# Patient Record
Sex: Male | Born: 1940
Health system: Southern US, Community
[De-identification: ages and names within clinical notes are randomized; demographics above are authoritative.]

## PROBLEM LIST (undated history)

## (undated) DIAGNOSIS — I10 Essential (primary) hypertension: Secondary | ICD-10-CM

## (undated) DIAGNOSIS — I519 Heart disease, unspecified: Secondary | ICD-10-CM

## (undated) DIAGNOSIS — K219 Gastro-esophageal reflux disease without esophagitis: Secondary | ICD-10-CM

## (undated) DIAGNOSIS — Z9289 Personal history of other medical treatment: Secondary | ICD-10-CM

## (undated) DIAGNOSIS — D126 Benign neoplasm of colon, unspecified: Secondary | ICD-10-CM

## (undated) DIAGNOSIS — T4145XA Adverse effect of unspecified anesthetic, initial encounter: Secondary | ICD-10-CM

## (undated) DIAGNOSIS — M8888 Osteitis deformans of other bones: Secondary | ICD-10-CM

## (undated) DIAGNOSIS — E1142 Type 2 diabetes mellitus with diabetic polyneuropathy: Secondary | ICD-10-CM

## (undated) DIAGNOSIS — T8859XA Other complications of anesthesia, initial encounter: Secondary | ICD-10-CM

## (undated) DIAGNOSIS — R112 Nausea with vomiting, unspecified: Secondary | ICD-10-CM

## (undated) DIAGNOSIS — C61 Malignant neoplasm of prostate: Secondary | ICD-10-CM

## (undated) DIAGNOSIS — I5189 Other ill-defined heart diseases: Secondary | ICD-10-CM

## (undated) DIAGNOSIS — Z96649 Presence of unspecified artificial hip joint: Secondary | ICD-10-CM

## (undated) DIAGNOSIS — Z9889 Other specified postprocedural states: Secondary | ICD-10-CM

## (undated) DIAGNOSIS — J302 Other seasonal allergic rhinitis: Secondary | ICD-10-CM

## (undated) DIAGNOSIS — K56609 Unspecified intestinal obstruction, unspecified as to partial versus complete obstruction: Secondary | ICD-10-CM

## (undated) DIAGNOSIS — K449 Diaphragmatic hernia without obstruction or gangrene: Secondary | ICD-10-CM

## (undated) DIAGNOSIS — I872 Venous insufficiency (chronic) (peripheral): Secondary | ICD-10-CM

## (undated) DIAGNOSIS — R011 Cardiac murmur, unspecified: Secondary | ICD-10-CM

## (undated) DIAGNOSIS — M86669 Other chronic osteomyelitis, unspecified tibia and fibula: Secondary | ICD-10-CM

## (undated) DIAGNOSIS — M1990 Unspecified osteoarthritis, unspecified site: Secondary | ICD-10-CM

## (undated) HISTORY — PX: WISDOM TOOTH EXTRACTION: SHX21

## (undated) HISTORY — PX: COLONOSCOPY: SHX174

## (undated) HISTORY — DX: Unspecified osteoarthritis, unspecified site: M19.90

## (undated) HISTORY — PX: LEG SURGERY: SHX1003

## (undated) HISTORY — DX: Benign neoplasm of colon, unspecified: D12.6

## (undated) HISTORY — DX: Diaphragmatic hernia without obstruction or gangrene: K44.9

## (undated) HISTORY — PX: SKIN GRAFT: SHX250

## (undated) HISTORY — PX: LEG WOUND REPAIR / CLOSURE: SUR1143

## (undated) HISTORY — PX: HIP ARTHROPLASTY: SHX981

## (undated) HISTORY — PX: WRIST FUSION: SHX839

## (undated) HISTORY — DX: Presence of unspecified artificial hip joint: Z96.649

## (undated) HISTORY — PX: UPPER GASTROINTESTINAL ENDOSCOPY: SHX188

## (undated) HISTORY — DX: Venous insufficiency (chronic) (peripheral): I87.2

---

## 1974-12-30 HISTORY — PX: SKIN GRAFT: SHX250

## 1998-05-16 ENCOUNTER — Ambulatory Visit (HOSPITAL_COMMUNITY): Admission: RE | Admit: 1998-05-16 | Discharge: 1998-05-16 | Payer: Self-pay | Admitting: Internal Medicine

## 1998-05-24 ENCOUNTER — Ambulatory Visit (HOSPITAL_COMMUNITY): Admission: RE | Admit: 1998-05-24 | Discharge: 1998-05-24 | Payer: Self-pay | Admitting: Internal Medicine

## 1998-06-26 ENCOUNTER — Ambulatory Visit (HOSPITAL_COMMUNITY): Admission: RE | Admit: 1998-06-26 | Discharge: 1998-06-26 | Payer: Self-pay | Admitting: Internal Medicine

## 1998-07-07 ENCOUNTER — Ambulatory Visit (HOSPITAL_COMMUNITY): Admission: RE | Admit: 1998-07-07 | Discharge: 1998-07-07 | Payer: Self-pay | Admitting: Internal Medicine

## 1998-07-12 ENCOUNTER — Ambulatory Visit (HOSPITAL_COMMUNITY): Admission: RE | Admit: 1998-07-12 | Discharge: 1998-07-12 | Payer: Self-pay | Admitting: Internal Medicine

## 1998-07-20 ENCOUNTER — Encounter: Admission: RE | Admit: 1998-07-20 | Discharge: 1998-10-18 | Payer: Self-pay | Admitting: Internal Medicine

## 1999-03-31 ENCOUNTER — Inpatient Hospital Stay (HOSPITAL_COMMUNITY): Admission: EM | Admit: 1999-03-31 | Discharge: 1999-04-05 | Payer: Self-pay | Admitting: Emergency Medicine

## 2000-01-07 ENCOUNTER — Encounter: Payer: Self-pay | Admitting: Emergency Medicine

## 2000-01-07 ENCOUNTER — Emergency Department (HOSPITAL_COMMUNITY): Admission: EM | Admit: 2000-01-07 | Discharge: 2000-01-07 | Payer: Self-pay | Admitting: Emergency Medicine

## 2000-01-29 ENCOUNTER — Encounter: Payer: Self-pay | Admitting: Sports Medicine

## 2000-01-29 ENCOUNTER — Encounter: Admission: RE | Admit: 2000-01-29 | Discharge: 2000-01-29 | Payer: Self-pay | Admitting: Sports Medicine

## 2000-01-29 ENCOUNTER — Encounter: Admission: RE | Admit: 2000-01-29 | Discharge: 2000-01-29 | Payer: Self-pay | Admitting: Family Medicine

## 2000-01-31 ENCOUNTER — Encounter (INDEPENDENT_AMBULATORY_CARE_PROVIDER_SITE_OTHER): Payer: Self-pay | Admitting: *Deleted

## 2000-03-03 ENCOUNTER — Encounter: Admission: RE | Admit: 2000-03-03 | Discharge: 2000-03-03 | Payer: Self-pay | Admitting: Family Medicine

## 2000-04-01 ENCOUNTER — Encounter: Admission: RE | Admit: 2000-04-01 | Discharge: 2000-04-01 | Payer: Self-pay | Admitting: Family Medicine

## 2000-04-14 ENCOUNTER — Encounter: Admission: RE | Admit: 2000-04-14 | Discharge: 2000-04-14 | Payer: Self-pay | Admitting: Family Medicine

## 2000-05-19 ENCOUNTER — Encounter: Admission: RE | Admit: 2000-05-19 | Discharge: 2000-08-17 | Payer: Self-pay | Admitting: Surgery

## 2000-05-20 ENCOUNTER — Encounter: Admission: RE | Admit: 2000-05-20 | Discharge: 2000-05-20 | Payer: Self-pay | Admitting: Family Medicine

## 2000-06-02 ENCOUNTER — Encounter (INDEPENDENT_AMBULATORY_CARE_PROVIDER_SITE_OTHER): Payer: Self-pay | Admitting: Specialist

## 2000-06-02 ENCOUNTER — Ambulatory Visit (HOSPITAL_COMMUNITY): Admission: RE | Admit: 2000-06-02 | Discharge: 2000-06-02 | Payer: Self-pay | Admitting: Gastroenterology

## 2000-06-09 ENCOUNTER — Encounter: Admission: RE | Admit: 2000-06-09 | Discharge: 2000-06-09 | Payer: Self-pay | Admitting: Family Medicine

## 2000-06-23 ENCOUNTER — Encounter: Admission: RE | Admit: 2000-06-23 | Discharge: 2000-06-23 | Payer: Self-pay | Admitting: Family Medicine

## 2000-06-27 ENCOUNTER — Encounter: Admission: RE | Admit: 2000-06-27 | Discharge: 2000-06-27 | Payer: Self-pay | Admitting: Family Medicine

## 2000-07-11 ENCOUNTER — Inpatient Hospital Stay (HOSPITAL_COMMUNITY): Admission: EM | Admit: 2000-07-11 | Discharge: 2000-07-12 | Payer: Self-pay | Admitting: Emergency Medicine

## 2000-07-17 ENCOUNTER — Encounter: Admission: RE | Admit: 2000-07-17 | Discharge: 2000-07-17 | Payer: Self-pay | Admitting: Family Medicine

## 2000-07-24 ENCOUNTER — Encounter: Admission: RE | Admit: 2000-07-24 | Discharge: 2000-07-24 | Payer: Self-pay | Admitting: Family Medicine

## 2000-07-31 ENCOUNTER — Encounter: Admission: RE | Admit: 2000-07-31 | Discharge: 2000-07-31 | Payer: Self-pay | Admitting: Family Medicine

## 2000-08-05 ENCOUNTER — Encounter: Payer: Self-pay | Admitting: Orthopedic Surgery

## 2000-08-05 ENCOUNTER — Encounter: Admission: RE | Admit: 2000-08-05 | Discharge: 2000-11-03 | Payer: Self-pay | Admitting: Orthopedic Surgery

## 2000-08-08 ENCOUNTER — Encounter: Payer: Self-pay | Admitting: Orthopedic Surgery

## 2000-08-08 ENCOUNTER — Ambulatory Visit (HOSPITAL_COMMUNITY): Admission: RE | Admit: 2000-08-08 | Discharge: 2000-08-08 | Payer: Self-pay | Admitting: Orthopedic Surgery

## 2000-09-03 ENCOUNTER — Encounter: Payer: Self-pay | Admitting: Orthopedic Surgery

## 2000-09-04 ENCOUNTER — Inpatient Hospital Stay (HOSPITAL_COMMUNITY): Admission: AD | Admit: 2000-09-04 | Discharge: 2000-09-09 | Payer: Self-pay | Admitting: Orthopedic Surgery

## 2000-09-09 ENCOUNTER — Inpatient Hospital Stay (HOSPITAL_COMMUNITY)
Admission: RE | Admit: 2000-09-09 | Discharge: 2000-09-16 | Payer: Self-pay | Admitting: Physical Medicine & Rehabilitation

## 2000-09-17 ENCOUNTER — Encounter: Admission: RE | Admit: 2000-09-17 | Discharge: 2000-09-17 | Payer: Self-pay | Admitting: Family Medicine

## 2000-10-01 ENCOUNTER — Inpatient Hospital Stay (HOSPITAL_COMMUNITY): Admission: RE | Admit: 2000-10-01 | Discharge: 2000-10-06 | Payer: Self-pay | Admitting: Orthopedic Surgery

## 2000-11-26 ENCOUNTER — Encounter: Admission: RE | Admit: 2000-11-26 | Discharge: 2000-11-26 | Payer: Self-pay | Admitting: Family Medicine

## 2000-12-03 ENCOUNTER — Encounter: Admission: RE | Admit: 2000-12-03 | Discharge: 2001-03-03 | Payer: Self-pay | Admitting: Internal Medicine

## 2001-02-06 ENCOUNTER — Encounter: Admission: RE | Admit: 2001-02-06 | Discharge: 2001-02-06 | Payer: Self-pay | Admitting: Family Medicine

## 2001-03-10 ENCOUNTER — Encounter: Admission: RE | Admit: 2001-03-10 | Discharge: 2001-06-08 | Payer: Self-pay | Admitting: Orthopedic Surgery

## 2001-04-06 ENCOUNTER — Encounter: Admission: RE | Admit: 2001-04-06 | Discharge: 2001-04-06 | Payer: Self-pay | Admitting: Family Medicine

## 2001-05-08 ENCOUNTER — Encounter: Admission: RE | Admit: 2001-05-08 | Discharge: 2001-05-08 | Payer: Self-pay | Admitting: Family Medicine

## 2001-06-05 ENCOUNTER — Encounter: Admission: RE | Admit: 2001-06-05 | Discharge: 2001-06-05 | Payer: Self-pay | Admitting: Family Medicine

## 2001-06-09 ENCOUNTER — Encounter: Admission: RE | Admit: 2001-06-09 | Discharge: 2001-08-17 | Payer: Self-pay | Admitting: Internal Medicine

## 2001-06-29 ENCOUNTER — Encounter: Admission: RE | Admit: 2001-06-29 | Discharge: 2001-06-29 | Payer: Self-pay | Admitting: Family Medicine

## 2001-07-13 ENCOUNTER — Encounter: Admission: RE | Admit: 2001-07-13 | Discharge: 2001-07-13 | Payer: Self-pay | Admitting: Family Medicine

## 2001-07-20 ENCOUNTER — Encounter: Admission: RE | Admit: 2001-07-20 | Discharge: 2001-07-20 | Payer: Self-pay | Admitting: Family Medicine

## 2001-07-31 ENCOUNTER — Encounter: Admission: RE | Admit: 2001-07-31 | Discharge: 2001-07-31 | Payer: Self-pay | Admitting: Family Medicine

## 2001-07-31 ENCOUNTER — Encounter: Payer: Self-pay | Admitting: *Deleted

## 2001-07-31 ENCOUNTER — Encounter: Admission: RE | Admit: 2001-07-31 | Discharge: 2001-07-31 | Payer: Self-pay | Admitting: *Deleted

## 2001-08-19 ENCOUNTER — Encounter: Admission: RE | Admit: 2001-08-19 | Discharge: 2001-08-19 | Payer: Self-pay | Admitting: Family Medicine

## 2001-08-31 ENCOUNTER — Encounter: Admission: RE | Admit: 2001-08-31 | Discharge: 2001-10-05 | Payer: Self-pay | Admitting: Internal Medicine

## 2001-10-05 ENCOUNTER — Encounter: Admission: RE | Admit: 2001-10-05 | Discharge: 2001-10-05 | Payer: Self-pay | Admitting: Family Medicine

## 2001-10-07 ENCOUNTER — Encounter: Admission: RE | Admit: 2001-10-07 | Discharge: 2001-10-07 | Payer: Self-pay | Admitting: *Deleted

## 2001-10-07 ENCOUNTER — Encounter: Payer: Self-pay | Admitting: *Deleted

## 2001-10-14 ENCOUNTER — Encounter: Admission: RE | Admit: 2001-10-14 | Discharge: 2001-10-14 | Payer: Self-pay | Admitting: Family Medicine

## 2001-10-18 ENCOUNTER — Emergency Department (HOSPITAL_COMMUNITY): Admission: EM | Admit: 2001-10-18 | Discharge: 2001-10-18 | Payer: Self-pay | Admitting: Emergency Medicine

## 2001-10-18 ENCOUNTER — Encounter: Payer: Self-pay | Admitting: Emergency Medicine

## 2001-10-20 ENCOUNTER — Ambulatory Visit (HOSPITAL_COMMUNITY): Admission: RE | Admit: 2001-10-20 | Discharge: 2001-10-20 | Payer: Self-pay | Admitting: Emergency Medicine

## 2001-10-20 ENCOUNTER — Encounter: Payer: Self-pay | Admitting: Emergency Medicine

## 2001-11-03 ENCOUNTER — Encounter: Admission: RE | Admit: 2001-11-03 | Discharge: 2001-11-03 | Payer: Self-pay | Admitting: Sports Medicine

## 2001-12-07 ENCOUNTER — Ambulatory Visit (HOSPITAL_COMMUNITY): Admission: RE | Admit: 2001-12-07 | Discharge: 2001-12-07 | Payer: Self-pay | Admitting: *Deleted

## 2001-12-07 ENCOUNTER — Encounter: Payer: Self-pay | Admitting: *Deleted

## 2001-12-08 ENCOUNTER — Encounter: Admission: RE | Admit: 2001-12-08 | Discharge: 2001-12-08 | Payer: Self-pay | Admitting: Family Medicine

## 2001-12-09 ENCOUNTER — Encounter: Admission: RE | Admit: 2001-12-09 | Discharge: 2001-12-09 | Payer: Self-pay | Admitting: Family Medicine

## 2001-12-18 ENCOUNTER — Encounter: Admission: RE | Admit: 2001-12-18 | Discharge: 2001-12-18 | Payer: Self-pay | Admitting: Family Medicine

## 2002-01-12 ENCOUNTER — Encounter: Admission: RE | Admit: 2002-01-12 | Discharge: 2002-04-02 | Payer: Self-pay | Admitting: Orthopedic Surgery

## 2002-02-05 ENCOUNTER — Encounter: Admission: RE | Admit: 2002-02-05 | Discharge: 2002-02-05 | Payer: Self-pay | Admitting: Family Medicine

## 2002-03-09 ENCOUNTER — Encounter: Admission: RE | Admit: 2002-03-09 | Discharge: 2002-03-09 | Payer: Self-pay | Admitting: Family Medicine

## 2002-03-19 ENCOUNTER — Encounter: Admission: RE | Admit: 2002-03-19 | Discharge: 2002-03-19 | Payer: Self-pay | Admitting: Family Medicine

## 2002-04-13 ENCOUNTER — Encounter (HOSPITAL_BASED_OUTPATIENT_CLINIC_OR_DEPARTMENT_OTHER): Admission: RE | Admit: 2002-04-13 | Discharge: 2002-04-19 | Payer: Self-pay | Admitting: Orthopedic Surgery

## 2002-04-19 ENCOUNTER — Encounter: Payer: Self-pay | Admitting: Orthopedic Surgery

## 2002-04-23 ENCOUNTER — Inpatient Hospital Stay (HOSPITAL_COMMUNITY): Admission: RE | Admit: 2002-04-23 | Discharge: 2002-04-28 | Payer: Self-pay | Admitting: Orthopedic Surgery

## 2002-04-23 ENCOUNTER — Encounter: Payer: Self-pay | Admitting: Orthopedic Surgery

## 2002-06-03 ENCOUNTER — Encounter: Admission: RE | Admit: 2002-06-03 | Discharge: 2002-06-03 | Payer: Self-pay | Admitting: Family Medicine

## 2002-06-17 ENCOUNTER — Encounter: Admission: RE | Admit: 2002-06-17 | Discharge: 2002-06-17 | Payer: Self-pay | Admitting: Family Medicine

## 2002-07-01 ENCOUNTER — Encounter: Admission: RE | Admit: 2002-07-01 | Discharge: 2002-07-01 | Payer: Self-pay | Admitting: Family Medicine

## 2002-07-15 ENCOUNTER — Encounter: Admission: RE | Admit: 2002-07-15 | Discharge: 2002-07-15 | Payer: Self-pay | Admitting: Family Medicine

## 2002-08-23 ENCOUNTER — Encounter: Admission: RE | Admit: 2002-08-23 | Discharge: 2002-08-23 | Payer: Self-pay | Admitting: Family Medicine

## 2002-09-06 ENCOUNTER — Encounter: Admission: RE | Admit: 2002-09-06 | Discharge: 2002-09-06 | Payer: Self-pay | Admitting: Family Medicine

## 2002-09-22 ENCOUNTER — Encounter: Admission: RE | Admit: 2002-09-22 | Discharge: 2002-09-22 | Payer: Self-pay | Admitting: Sports Medicine

## 2002-10-06 ENCOUNTER — Encounter: Admission: RE | Admit: 2002-10-06 | Discharge: 2002-10-06 | Payer: Self-pay | Admitting: Family Medicine

## 2002-10-21 ENCOUNTER — Encounter: Admission: RE | Admit: 2002-10-21 | Discharge: 2002-10-21 | Payer: Self-pay | Admitting: Family Medicine

## 2002-11-03 ENCOUNTER — Encounter: Admission: RE | Admit: 2002-11-03 | Discharge: 2002-11-03 | Payer: Self-pay | Admitting: Family Medicine

## 2003-01-19 ENCOUNTER — Encounter: Admission: RE | Admit: 2003-01-19 | Discharge: 2003-01-19 | Payer: Self-pay | Admitting: Family Medicine

## 2003-02-09 ENCOUNTER — Encounter: Payer: Self-pay | Admitting: Orthopedic Surgery

## 2003-02-09 ENCOUNTER — Inpatient Hospital Stay (HOSPITAL_COMMUNITY): Admission: RE | Admit: 2003-02-09 | Discharge: 2003-02-15 | Payer: Self-pay | Admitting: Orthopedic Surgery

## 2003-02-15 ENCOUNTER — Inpatient Hospital Stay
Admission: RE | Admit: 2003-02-15 | Discharge: 2003-03-01 | Payer: Self-pay | Admitting: Physical Medicine & Rehabilitation

## 2003-04-14 ENCOUNTER — Encounter: Admission: RE | Admit: 2003-04-14 | Discharge: 2003-04-14 | Payer: Self-pay | Admitting: Family Medicine

## 2003-08-22 ENCOUNTER — Encounter: Admission: RE | Admit: 2003-08-22 | Discharge: 2003-08-22 | Payer: Self-pay | Admitting: Sports Medicine

## 2003-10-19 ENCOUNTER — Encounter: Admission: RE | Admit: 2003-10-19 | Discharge: 2003-10-19 | Payer: Self-pay | Admitting: Family Medicine

## 2003-10-26 ENCOUNTER — Encounter: Admission: RE | Admit: 2003-10-26 | Discharge: 2003-10-26 | Payer: Self-pay | Admitting: Family Medicine

## 2003-11-09 ENCOUNTER — Encounter: Admission: RE | Admit: 2003-11-09 | Discharge: 2003-11-09 | Payer: Self-pay | Admitting: Family Medicine

## 2003-11-21 ENCOUNTER — Encounter: Admission: RE | Admit: 2003-11-21 | Discharge: 2003-11-21 | Payer: Self-pay | Admitting: Family Medicine

## 2003-11-23 ENCOUNTER — Inpatient Hospital Stay (HOSPITAL_COMMUNITY): Admission: RE | Admit: 2003-11-23 | Discharge: 2003-11-28 | Payer: Self-pay | Admitting: Orthopedic Surgery

## 2003-11-28 ENCOUNTER — Inpatient Hospital Stay (HOSPITAL_COMMUNITY)
Admission: RE | Admit: 2003-11-28 | Discharge: 2003-12-05 | Payer: Self-pay | Admitting: Physical Medicine & Rehabilitation

## 2003-12-26 ENCOUNTER — Encounter: Admission: RE | Admit: 2003-12-26 | Discharge: 2003-12-26 | Payer: Self-pay | Admitting: Family Medicine

## 2004-02-16 ENCOUNTER — Encounter: Admission: RE | Admit: 2004-02-16 | Discharge: 2004-02-16 | Payer: Self-pay | Admitting: Sports Medicine

## 2004-03-08 ENCOUNTER — Encounter: Admission: RE | Admit: 2004-03-08 | Discharge: 2004-03-08 | Payer: Self-pay | Admitting: Family Medicine

## 2004-05-16 ENCOUNTER — Encounter: Admission: RE | Admit: 2004-05-16 | Discharge: 2004-05-16 | Payer: Self-pay | Admitting: Family Medicine

## 2004-06-15 ENCOUNTER — Encounter: Admission: RE | Admit: 2004-06-15 | Discharge: 2004-06-15 | Payer: Self-pay | Admitting: Family Medicine

## 2004-08-05 ENCOUNTER — Emergency Department (HOSPITAL_COMMUNITY): Admission: EM | Admit: 2004-08-05 | Discharge: 2004-08-05 | Payer: Self-pay

## 2004-08-06 ENCOUNTER — Encounter: Admission: RE | Admit: 2004-08-06 | Discharge: 2004-08-06 | Payer: Self-pay | Admitting: Family Medicine

## 2004-08-13 ENCOUNTER — Encounter: Admission: RE | Admit: 2004-08-13 | Discharge: 2004-08-13 | Payer: Self-pay | Admitting: Family Medicine

## 2004-09-17 ENCOUNTER — Ambulatory Visit: Payer: Self-pay | Admitting: Family Medicine

## 2004-09-21 ENCOUNTER — Ambulatory Visit: Payer: Self-pay | Admitting: Family Medicine

## 2004-10-25 ENCOUNTER — Ambulatory Visit: Payer: Self-pay | Admitting: Family Medicine

## 2004-11-06 ENCOUNTER — Ambulatory Visit: Payer: Self-pay | Admitting: Family Medicine

## 2004-12-13 ENCOUNTER — Ambulatory Visit: Payer: Self-pay | Admitting: Family Medicine

## 2005-01-29 ENCOUNTER — Ambulatory Visit: Payer: Self-pay | Admitting: Family Medicine

## 2005-01-31 ENCOUNTER — Encounter: Admission: RE | Admit: 2005-01-31 | Discharge: 2005-01-31 | Payer: Self-pay | Admitting: Sports Medicine

## 2005-02-12 ENCOUNTER — Ambulatory Visit: Payer: Self-pay | Admitting: Sports Medicine

## 2005-03-05 ENCOUNTER — Encounter (INDEPENDENT_AMBULATORY_CARE_PROVIDER_SITE_OTHER): Payer: Self-pay | Admitting: Specialist

## 2005-03-05 ENCOUNTER — Ambulatory Visit (HOSPITAL_COMMUNITY): Admission: RE | Admit: 2005-03-05 | Discharge: 2005-03-05 | Payer: Self-pay | Admitting: Gastroenterology

## 2005-03-08 ENCOUNTER — Ambulatory Visit: Payer: Self-pay | Admitting: Family Medicine

## 2005-03-14 ENCOUNTER — Ambulatory Visit: Payer: Self-pay | Admitting: Family Medicine

## 2005-04-08 ENCOUNTER — Ambulatory Visit: Payer: Self-pay | Admitting: Family Medicine

## 2005-06-28 ENCOUNTER — Ambulatory Visit: Payer: Self-pay | Admitting: Family Medicine

## 2005-07-09 ENCOUNTER — Ambulatory Visit: Payer: Self-pay | Admitting: Family Medicine

## 2005-10-21 ENCOUNTER — Ambulatory Visit: Payer: Self-pay | Admitting: Family Medicine

## 2005-11-27 ENCOUNTER — Ambulatory Visit: Payer: Self-pay | Admitting: Family Medicine

## 2006-01-27 ENCOUNTER — Ambulatory Visit: Payer: Self-pay | Admitting: Sports Medicine

## 2006-02-18 ENCOUNTER — Ambulatory Visit: Admission: RE | Admit: 2006-02-18 | Discharge: 2006-02-18 | Payer: Self-pay | Admitting: Orthopedic Surgery

## 2006-02-19 ENCOUNTER — Ambulatory Visit: Payer: Self-pay | Admitting: Cardiology

## 2006-02-24 ENCOUNTER — Encounter (HOSPITAL_COMMUNITY): Admission: RE | Admit: 2006-02-24 | Discharge: 2006-05-25 | Payer: Self-pay | Admitting: Cardiology

## 2006-02-25 ENCOUNTER — Ambulatory Visit: Payer: Self-pay | Admitting: Cardiovascular Disease

## 2006-02-27 ENCOUNTER — Ambulatory Visit: Payer: Self-pay

## 2006-02-27 ENCOUNTER — Encounter: Payer: Self-pay | Admitting: Internal Medicine

## 2006-03-19 ENCOUNTER — Ambulatory Visit: Payer: Self-pay | Admitting: Physical Medicine & Rehabilitation

## 2006-03-19 ENCOUNTER — Ambulatory Visit: Payer: Self-pay | Admitting: Family Medicine

## 2006-03-19 ENCOUNTER — Inpatient Hospital Stay (HOSPITAL_COMMUNITY): Admission: RE | Admit: 2006-03-19 | Discharge: 2006-03-26 | Payer: Self-pay | Admitting: Orthopedic Surgery

## 2006-04-03 ENCOUNTER — Ambulatory Visit: Payer: Self-pay | Admitting: Family Medicine

## 2006-04-03 ENCOUNTER — Encounter: Payer: Self-pay | Admitting: Vascular Surgery

## 2006-04-03 ENCOUNTER — Ambulatory Visit (HOSPITAL_COMMUNITY): Admission: RE | Admit: 2006-04-03 | Discharge: 2006-04-03 | Payer: Self-pay | Admitting: Family Medicine

## 2006-04-11 ENCOUNTER — Ambulatory Visit: Payer: Self-pay | Admitting: Family Medicine

## 2006-04-18 ENCOUNTER — Ambulatory Visit: Payer: Self-pay | Admitting: Family Medicine

## 2006-04-25 ENCOUNTER — Ambulatory Visit: Payer: Self-pay | Admitting: Family Medicine

## 2006-09-29 DIAGNOSIS — K449 Diaphragmatic hernia without obstruction or gangrene: Secondary | ICD-10-CM

## 2006-09-29 HISTORY — DX: Diaphragmatic hernia without obstruction or gangrene: K44.9

## 2006-10-20 ENCOUNTER — Ambulatory Visit: Payer: Self-pay | Admitting: Family Medicine

## 2006-10-20 LAB — CONVERTED CEMR LAB: PSA: 3.01 ng/mL

## 2006-11-11 ENCOUNTER — Ambulatory Visit: Payer: Self-pay | Admitting: Family Medicine

## 2006-12-31 ENCOUNTER — Ambulatory Visit: Payer: Self-pay | Admitting: Sports Medicine

## 2007-02-04 ENCOUNTER — Ambulatory Visit: Payer: Self-pay | Admitting: Family Medicine

## 2007-02-26 DIAGNOSIS — N529 Male erectile dysfunction, unspecified: Secondary | ICD-10-CM

## 2007-02-26 DIAGNOSIS — I872 Venous insufficiency (chronic) (peripheral): Secondary | ICD-10-CM

## 2007-02-26 DIAGNOSIS — D126 Benign neoplasm of colon, unspecified: Secondary | ICD-10-CM

## 2007-02-26 HISTORY — DX: Venous insufficiency (chronic) (peripheral): I87.2

## 2007-02-26 HISTORY — DX: Benign neoplasm of colon, unspecified: D12.6

## 2007-02-27 ENCOUNTER — Encounter (INDEPENDENT_AMBULATORY_CARE_PROVIDER_SITE_OTHER): Payer: Self-pay | Admitting: *Deleted

## 2007-03-05 ENCOUNTER — Ambulatory Visit: Payer: Self-pay | Admitting: Family Medicine

## 2007-03-05 DIAGNOSIS — Z6841 Body Mass Index (BMI) 40.0 and over, adult: Secondary | ICD-10-CM

## 2007-03-05 DIAGNOSIS — I10 Essential (primary) hypertension: Secondary | ICD-10-CM

## 2007-03-05 DIAGNOSIS — M1990 Unspecified osteoarthritis, unspecified site: Secondary | ICD-10-CM

## 2007-03-05 HISTORY — DX: Unspecified osteoarthritis, unspecified site: M19.90

## 2007-04-01 ENCOUNTER — Telehealth: Payer: Self-pay | Admitting: *Deleted

## 2007-04-27 ENCOUNTER — Telehealth (INDEPENDENT_AMBULATORY_CARE_PROVIDER_SITE_OTHER): Payer: Self-pay | Admitting: Family Medicine

## 2007-05-04 ENCOUNTER — Encounter (INDEPENDENT_AMBULATORY_CARE_PROVIDER_SITE_OTHER): Payer: Self-pay | Admitting: Family Medicine

## 2007-05-07 ENCOUNTER — Telehealth (INDEPENDENT_AMBULATORY_CARE_PROVIDER_SITE_OTHER): Payer: Self-pay | Admitting: *Deleted

## 2007-05-12 ENCOUNTER — Ambulatory Visit: Payer: Self-pay | Admitting: Family Medicine

## 2007-05-12 DIAGNOSIS — Z96649 Presence of unspecified artificial hip joint: Secondary | ICD-10-CM

## 2007-05-12 HISTORY — DX: Presence of unspecified artificial hip joint: Z96.649

## 2007-06-08 ENCOUNTER — Ambulatory Visit: Payer: Self-pay | Admitting: Family Medicine

## 2007-06-08 ENCOUNTER — Encounter: Payer: Self-pay | Admitting: Family Medicine

## 2007-06-08 ENCOUNTER — Telehealth: Payer: Self-pay | Admitting: *Deleted

## 2007-06-08 LAB — CONVERTED CEMR LAB
Bilirubin Urine: NEGATIVE
Blood in Urine, dipstick: NEGATIVE
Glucose, Urine, Semiquant: NEGATIVE
HCT: 35.2 %
Hemoglobin: 11.7 g/dL
MCV: 80.1 fL
Nitrite: NEGATIVE
Platelets: 264 10*3/uL
Protein, U semiquant: 30
Urobilinogen, UA: 0.2
WBC Urine, dipstick: NEGATIVE

## 2007-06-09 LAB — CONVERTED CEMR LAB
ALT: 14 units/L (ref 0–53)
BUN: 18 mg/dL (ref 6–23)
Glucose, Bld: 152 mg/dL — ABNORMAL HIGH (ref 70–99)
Potassium: 4.3 meq/L (ref 3.5–5.3)
Total Protein: 7.7 g/dL (ref 6.0–8.3)

## 2007-09-01 ENCOUNTER — Ambulatory Visit: Payer: Self-pay | Admitting: Family Medicine

## 2007-10-01 ENCOUNTER — Encounter (INDEPENDENT_AMBULATORY_CARE_PROVIDER_SITE_OTHER): Payer: Self-pay | Admitting: Family Medicine

## 2007-12-09 ENCOUNTER — Ambulatory Visit: Payer: Self-pay | Admitting: Family Medicine

## 2008-01-20 ENCOUNTER — Ambulatory Visit: Payer: Self-pay | Admitting: Family Medicine

## 2008-01-20 ENCOUNTER — Encounter (INDEPENDENT_AMBULATORY_CARE_PROVIDER_SITE_OTHER): Payer: Self-pay | Admitting: Family Medicine

## 2008-01-20 DIAGNOSIS — E1140 Type 2 diabetes mellitus with diabetic neuropathy, unspecified: Secondary | ICD-10-CM

## 2008-01-20 DIAGNOSIS — I358 Other nonrheumatic aortic valve disorders: Secondary | ICD-10-CM

## 2008-01-20 DIAGNOSIS — E1165 Type 2 diabetes mellitus with hyperglycemia: Secondary | ICD-10-CM

## 2008-01-20 LAB — CONVERTED CEMR LAB
BUN: 19 mg/dL (ref 6–23)
CO2: 22 meq/L (ref 19–32)
Calcium: 8.7 mg/dL (ref 8.4–10.5)
Creatinine, Ser: 1.01 mg/dL (ref 0.40–1.50)
Glucose, Bld: 227 mg/dL — ABNORMAL HIGH (ref 70–99)
Potassium: 4.2 meq/L (ref 3.5–5.3)

## 2008-02-07 ENCOUNTER — Encounter: Payer: Self-pay | Admitting: Family Medicine

## 2008-02-07 ENCOUNTER — Ambulatory Visit: Payer: Self-pay | Admitting: Internal Medicine

## 2008-02-07 ENCOUNTER — Inpatient Hospital Stay (HOSPITAL_COMMUNITY): Admission: EM | Admit: 2008-02-07 | Discharge: 2008-02-08 | Payer: Self-pay | Admitting: Emergency Medicine

## 2008-02-12 ENCOUNTER — Encounter (INDEPENDENT_AMBULATORY_CARE_PROVIDER_SITE_OTHER): Payer: Self-pay | Admitting: Family Medicine

## 2008-02-12 ENCOUNTER — Ambulatory Visit: Payer: Self-pay | Admitting: Family Medicine

## 2008-02-12 LAB — CONVERTED CEMR LAB
CO2: 26 meq/L (ref 19–32)
Chloride: 100 meq/L (ref 96–112)
Creatinine, Ser: 1.22 mg/dL (ref 0.40–1.50)
Potassium: 4.6 meq/L (ref 3.5–5.3)

## 2008-03-17 ENCOUNTER — Encounter (INDEPENDENT_AMBULATORY_CARE_PROVIDER_SITE_OTHER): Payer: Self-pay | Admitting: Family Medicine

## 2008-03-17 ENCOUNTER — Ambulatory Visit: Payer: Self-pay | Admitting: Family Medicine

## 2008-03-17 ENCOUNTER — Ambulatory Visit (HOSPITAL_COMMUNITY): Admission: RE | Admit: 2008-03-17 | Discharge: 2008-03-17 | Payer: Self-pay | Admitting: Cardiology

## 2008-03-17 LAB — CONVERTED CEMR LAB
CO2: 24 meq/L (ref 19–32)
Chloride: 107 meq/L (ref 96–112)
Creatinine, Ser: 1.16 mg/dL (ref 0.40–1.50)
Glucose, Bld: 103 mg/dL — ABNORMAL HIGH (ref 70–99)
Pro B Natriuretic peptide (BNP): 77 pg/mL (ref 0.0–100.0)

## 2008-03-31 ENCOUNTER — Encounter (INDEPENDENT_AMBULATORY_CARE_PROVIDER_SITE_OTHER): Payer: Self-pay | Admitting: Neurology

## 2008-03-31 ENCOUNTER — Ambulatory Visit (HOSPITAL_COMMUNITY): Admission: RE | Admit: 2008-03-31 | Discharge: 2008-03-31 | Payer: Self-pay | Admitting: Family Medicine

## 2008-04-07 ENCOUNTER — Encounter (INDEPENDENT_AMBULATORY_CARE_PROVIDER_SITE_OTHER): Payer: Self-pay | Admitting: Family Medicine

## 2008-04-07 ENCOUNTER — Ambulatory Visit: Payer: Self-pay | Admitting: Family Medicine

## 2008-04-07 LAB — CONVERTED CEMR LAB
ALT: 12 units/L (ref 0–53)
AST: 12 units/L (ref 0–37)
Albumin: 3.5 g/dL (ref 3.5–5.2)
Alkaline Phosphatase: 148 units/L — ABNORMAL HIGH (ref 39–117)
BUN: 22 mg/dL (ref 6–23)
CO2: 24 meq/L (ref 19–32)
Direct LDL: 92 mg/dL
PSA: 10.57 ng/mL — ABNORMAL HIGH (ref 0.10–4.00)
Potassium: 4.3 meq/L (ref 3.5–5.3)

## 2008-04-13 ENCOUNTER — Ambulatory Visit: Payer: Self-pay | Admitting: Family Medicine

## 2008-04-13 ENCOUNTER — Encounter: Payer: Self-pay | Admitting: *Deleted

## 2008-04-13 ENCOUNTER — Encounter (INDEPENDENT_AMBULATORY_CARE_PROVIDER_SITE_OTHER): Payer: Self-pay | Admitting: Family Medicine

## 2008-04-13 LAB — CONVERTED CEMR LAB: PSA: 9.62 ng/mL — ABNORMAL HIGH (ref 0.10–4.00)

## 2008-04-20 ENCOUNTER — Telehealth (INDEPENDENT_AMBULATORY_CARE_PROVIDER_SITE_OTHER): Payer: Self-pay | Admitting: *Deleted

## 2008-04-25 ENCOUNTER — Ambulatory Visit: Payer: Self-pay | Admitting: Family Medicine

## 2008-04-25 ENCOUNTER — Encounter (INDEPENDENT_AMBULATORY_CARE_PROVIDER_SITE_OTHER): Payer: Self-pay | Admitting: Family Medicine

## 2008-04-27 ENCOUNTER — Encounter (INDEPENDENT_AMBULATORY_CARE_PROVIDER_SITE_OTHER): Payer: Self-pay | Admitting: Family Medicine

## 2008-04-27 ENCOUNTER — Telehealth (INDEPENDENT_AMBULATORY_CARE_PROVIDER_SITE_OTHER): Payer: Self-pay | Admitting: *Deleted

## 2008-05-04 ENCOUNTER — Encounter (INDEPENDENT_AMBULATORY_CARE_PROVIDER_SITE_OTHER): Payer: Self-pay | Admitting: Family Medicine

## 2008-05-04 ENCOUNTER — Encounter: Payer: Self-pay | Admitting: Family Medicine

## 2008-05-12 ENCOUNTER — Ambulatory Visit (HOSPITAL_BASED_OUTPATIENT_CLINIC_OR_DEPARTMENT_OTHER): Admission: RE | Admit: 2008-05-12 | Discharge: 2008-05-12 | Payer: Self-pay | Admitting: Urology

## 2008-05-12 ENCOUNTER — Encounter: Payer: Self-pay | Admitting: Urology

## 2008-05-24 ENCOUNTER — Ambulatory Visit: Payer: Self-pay | Admitting: Family Medicine

## 2008-05-24 DIAGNOSIS — C61 Malignant neoplasm of prostate: Secondary | ICD-10-CM | POA: Insufficient documentation

## 2008-06-03 ENCOUNTER — Encounter: Admission: RE | Admit: 2008-06-03 | Discharge: 2008-06-03 | Payer: Self-pay | Admitting: Urology

## 2008-06-08 ENCOUNTER — Telehealth: Payer: Self-pay | Admitting: *Deleted

## 2008-06-08 ENCOUNTER — Telehealth (INDEPENDENT_AMBULATORY_CARE_PROVIDER_SITE_OTHER): Payer: Self-pay | Admitting: Family Medicine

## 2008-06-09 ENCOUNTER — Encounter (INDEPENDENT_AMBULATORY_CARE_PROVIDER_SITE_OTHER): Payer: Self-pay | Admitting: Family Medicine

## 2008-06-23 ENCOUNTER — Encounter (INDEPENDENT_AMBULATORY_CARE_PROVIDER_SITE_OTHER): Payer: Self-pay | Admitting: Family Medicine

## 2008-06-30 ENCOUNTER — Encounter: Payer: Self-pay | Admitting: Family Medicine

## 2008-07-12 ENCOUNTER — Ambulatory Visit: Admission: RE | Admit: 2008-07-12 | Discharge: 2008-08-24 | Payer: Self-pay | Admitting: Radiation Oncology

## 2008-07-29 ENCOUNTER — Ambulatory Visit: Payer: Self-pay | Admitting: Family Medicine

## 2008-07-29 LAB — CONVERTED CEMR LAB: Hgb A1c MFr Bld: 7.2 %

## 2008-11-21 ENCOUNTER — Encounter: Payer: Self-pay | Admitting: Family Medicine

## 2008-12-01 ENCOUNTER — Ambulatory Visit: Payer: Self-pay | Admitting: Family Medicine

## 2008-12-01 ENCOUNTER — Encounter: Payer: Self-pay | Admitting: Family Medicine

## 2008-12-01 DIAGNOSIS — E785 Hyperlipidemia, unspecified: Secondary | ICD-10-CM

## 2008-12-01 LAB — CONVERTED CEMR LAB: Hgb A1c MFr Bld: 8.1 %

## 2008-12-06 ENCOUNTER — Encounter: Payer: Self-pay | Admitting: Family Medicine

## 2009-01-24 ENCOUNTER — Telehealth (INDEPENDENT_AMBULATORY_CARE_PROVIDER_SITE_OTHER): Payer: Self-pay | Admitting: *Deleted

## 2009-01-30 ENCOUNTER — Encounter: Payer: Self-pay | Admitting: Family Medicine

## 2009-02-23 ENCOUNTER — Encounter: Payer: Self-pay | Admitting: Family Medicine

## 2009-03-20 ENCOUNTER — Ambulatory Visit: Payer: Self-pay | Admitting: Family Medicine

## 2009-03-20 LAB — CONVERTED CEMR LAB: Hgb A1c MFr Bld: 8.4 %

## 2009-03-30 ENCOUNTER — Ambulatory Visit: Payer: Self-pay | Admitting: Family Medicine

## 2009-05-05 ENCOUNTER — Telehealth: Payer: Self-pay | Admitting: *Deleted

## 2009-05-08 ENCOUNTER — Encounter: Payer: Self-pay | Admitting: Family Medicine

## 2009-05-08 ENCOUNTER — Ambulatory Visit: Payer: Self-pay | Admitting: Family Medicine

## 2009-05-11 ENCOUNTER — Encounter: Payer: Self-pay | Admitting: Family Medicine

## 2009-05-25 ENCOUNTER — Encounter: Payer: Self-pay | Admitting: Family Medicine

## 2009-08-07 ENCOUNTER — Ambulatory Visit: Payer: Self-pay | Admitting: Family Medicine

## 2009-08-07 ENCOUNTER — Encounter: Payer: Self-pay | Admitting: Family Medicine

## 2009-08-07 LAB — CONVERTED CEMR LAB
Cholesterol: 131 mg/dL (ref 0–200)
LDL Cholesterol: 72 mg/dL (ref 0–99)
Total CHOL/HDL Ratio: 3.1
VLDL: 17 mg/dL (ref 0–40)

## 2009-08-25 ENCOUNTER — Encounter: Payer: Self-pay | Admitting: Family Medicine

## 2009-09-28 ENCOUNTER — Encounter: Payer: Self-pay | Admitting: *Deleted

## 2009-10-06 ENCOUNTER — Ambulatory Visit: Payer: Self-pay | Admitting: Family Medicine

## 2009-10-10 ENCOUNTER — Encounter: Payer: Self-pay | Admitting: Family Medicine

## 2009-10-10 ENCOUNTER — Telehealth: Payer: Self-pay | Admitting: Family Medicine

## 2009-10-12 ENCOUNTER — Telehealth: Payer: Self-pay | Admitting: *Deleted

## 2009-10-16 ENCOUNTER — Encounter: Payer: Self-pay | Admitting: Family Medicine

## 2009-10-16 ENCOUNTER — Telehealth: Payer: Self-pay | Admitting: *Deleted

## 2009-10-20 ENCOUNTER — Telehealth (INDEPENDENT_AMBULATORY_CARE_PROVIDER_SITE_OTHER): Payer: Self-pay | Admitting: *Deleted

## 2009-11-08 ENCOUNTER — Encounter: Payer: Self-pay | Admitting: Family Medicine

## 2009-11-30 ENCOUNTER — Encounter: Payer: Self-pay | Admitting: Family Medicine

## 2010-01-29 ENCOUNTER — Ambulatory Visit: Payer: Self-pay | Admitting: Family Medicine

## 2010-01-29 LAB — CONVERTED CEMR LAB: Hgb A1c MFr Bld: 8.4 %

## 2010-03-22 ENCOUNTER — Encounter: Payer: Self-pay | Admitting: Family Medicine

## 2010-04-27 ENCOUNTER — Encounter: Payer: Self-pay | Admitting: Family Medicine

## 2010-05-03 ENCOUNTER — Ambulatory Visit: Payer: Self-pay | Admitting: Family Medicine

## 2010-05-03 ENCOUNTER — Encounter: Payer: Self-pay | Admitting: Family Medicine

## 2010-05-03 LAB — CONVERTED CEMR LAB
Calcium: 9.1 mg/dL (ref 8.4–10.5)
Creatinine, Ser: 1.12 mg/dL (ref 0.40–1.50)
Hgb A1c MFr Bld: 9.4 %
Sodium: 135 meq/L (ref 135–145)

## 2010-09-13 ENCOUNTER — Ambulatory Visit: Payer: Self-pay | Admitting: Family Medicine

## 2010-09-13 DIAGNOSIS — L97909 Non-pressure chronic ulcer of unspecified part of unspecified lower leg with unspecified severity: Secondary | ICD-10-CM

## 2010-09-13 DIAGNOSIS — I83009 Varicose veins of unspecified lower extremity with ulcer of unspecified site: Secondary | ICD-10-CM

## 2010-09-13 LAB — CONVERTED CEMR LAB: Hgb A1c MFr Bld: 8.9 %

## 2010-09-28 ENCOUNTER — Encounter (HOSPITAL_BASED_OUTPATIENT_CLINIC_OR_DEPARTMENT_OTHER): Admission: RE | Admit: 2010-09-28 | Discharge: 2010-11-06 | Payer: Self-pay | Admitting: Internal Medicine

## 2010-10-03 ENCOUNTER — Ambulatory Visit: Payer: Self-pay | Admitting: Vascular Surgery

## 2010-10-04 ENCOUNTER — Encounter: Payer: Self-pay | Admitting: Family Medicine

## 2010-10-22 ENCOUNTER — Encounter: Payer: Self-pay | Admitting: Family Medicine

## 2010-10-24 ENCOUNTER — Ambulatory Visit: Payer: Self-pay | Admitting: Family Medicine

## 2010-10-30 ENCOUNTER — Encounter: Payer: Self-pay | Admitting: Family Medicine

## 2010-11-13 ENCOUNTER — Encounter: Payer: Self-pay | Admitting: Family Medicine

## 2010-11-20 ENCOUNTER — Encounter: Payer: Self-pay | Admitting: Family Medicine

## 2010-11-20 DIAGNOSIS — M86669 Other chronic osteomyelitis, unspecified tibia and fibula: Secondary | ICD-10-CM

## 2010-11-20 HISTORY — DX: Other chronic osteomyelitis, unspecified tibia and fibula: M86.669

## 2010-12-04 ENCOUNTER — Encounter: Payer: Self-pay | Admitting: Family Medicine

## 2010-12-10 ENCOUNTER — Telehealth: Payer: Self-pay | Admitting: Family Medicine

## 2011-01-20 ENCOUNTER — Encounter: Payer: Self-pay | Admitting: Cardiology

## 2011-01-20 ENCOUNTER — Encounter: Payer: Self-pay | Admitting: Urology

## 2011-01-23 ENCOUNTER — Ambulatory Visit: Admission: RE | Admit: 2011-01-23 | Discharge: 2011-01-23 | Payer: Self-pay | Source: Home / Self Care

## 2011-01-31 NOTE — Consult Note (Signed)
Summary: [facility]   Imported By: De Nurse 11/20/2010 10:49:59  _____________________________________________________________________  External Attachment:    Type:   Image     Comment:   External Document

## 2011-01-31 NOTE — Miscellaneous (Signed)
  Clinical Lists Changes  Problems: Added new problem of CHRONIC OSTEOMYELITIS, LOWER LEG (ICD-730.16)      Allergies: 1)  ! * Arbs 2)  ! Ace Inhibitors 3)  ! Glucophage 4)  Lisinopril (Lisinopril)

## 2011-01-31 NOTE — Assessment & Plan Note (Signed)
Summary: diabetes/ stasis ulcers/ obesity   FLU SHOT GIVEN TODAY.[name], CMA  October 24, 2010 12:20 PM  Vital Signs:  Patient profile:   70 year old male Weight:      418.2 pounds BMI:     56.92 Temp:     98.1 degrees F Pulse rate:   91 / minute BP sitting:   137 / 77  Vitals Entered By: Starleen Blue RN 10/24/2010 CC: f/u Is Patient Diabetic? Yes Pain Assessment Patient in pain? no        Primary Care Provider:  Ellin Mayhew MD  CC:  f/u.  History of Present Illness: Diabetes: pt states that he is taking medication as directed.  He also checks his BG 3-4 x daily but he has not written down his bg levels in order to bring in for review.  Explained to pt that we can not know if his BG control is improving unless I can see his bg log.  Pt states understanding and states that he will try to bring in logbook to next appt.  lower extremity ulcers: Pt is being seen in Wound care center for small ulcers on back of left lateral leg.  Wrap in place today so I could not evaluation but pt states that the team in the wound care center is following it closely and doing all of the wound care.  He states that these areas are improving.   One small area has opened up on right shin next to scarring from previous orthopedic operative site.  Pt states that he was sent by Wound care center to Dr. Lajoyce Corners for evaluation of this area.  Pt states that Dr. Lajoyce Corners started him on doxycycline for this wound and said to keep it covered with a gauze dressing.  I do not have a copy of this consult visit.  Obesity: Pt states that he currently is not doing any exercise.  He states he  is confined to chair and motorized wheelchair 2/2  arthritis pain and leg pain.  he states that he has been trying to eat healthier.  Pt encouraged to meet with nutritionist but refuses at this time.  Pt also encouraged to go to PT to develop exercise plan that he can do from a chair that will help with weight loss and arthritis  pain.  Pt states that he doesn't want a PT consult at this time.  We talked about how the obesity is the root problem of the above to issues, ulcers and diabetes, and that weight loss could help get better control of these 2 issues.  Pt states understanding.  Doesn't seem to be willing to enagage in much conversation about this topic at this point in time.  Habits & Providers  Alcohol-Tobacco-Diet     Tobacco Status: never  Current Medications (verified): 1)  Bayer Childrens Aspirin 81 Mg Chew (Aspirin) .... Take 1 Tablet By Mouth Once A Day 2)  Tramadol Hcl 50 Mg Tabs (Tramadol Hcl) .Marland Kitchen.. 1 Tab By Mouth Two Times A Day For Pain 3)  Flomax 0.4 Mg Cp24 (Tamsulosin Hcl) .... Take 1 By Mouth Twice A Day, Use This Medication Instead of The Once A Day Prescription. 4)  Furosemide 40 Mg Tabs (Furosemide) .... 2 Tab By Mouth Daily 5)  Lantus 100 Unit/ml Soln (Insulin Glargine) .... Inject 60 Unit Subcutaneously Twice A Day, Disp Qs 1 Month 6)  Metoprolol Tartrate 100 Mg  Tabs (Metoprolol Tartrate) .Marland Kitchen.. 1 Tablet Twice A Day For Blood  Pressure, Hold If Pulse Less Than 60. 7)  Nasonex 50 Mcg/act Susp (Mometasone Furoate) .... Spray 1 Spray As Directed Twice A Day 8)  Novolog 100 Unit/ml Soln (Insulin Aspart) .... Inject 20 Unit Subcutaneously With  With Lunch and 20 With Supper. Disp Qs X 1 Month 9)  Nexium 40 Mg Cpdr (Esomeprazole Magnesium) .Marland Kitchen.. 1 Tablet Two Times A Day For Reflux 10)  Xalatan 0.005 %  Soln (Latanoprost) .Marland Kitchen.. 1 Drop Into Both Eyes For Glacoma in The Evening. 11)  Triamterene-Hctz 37.5-25 Mg  Tabs (Triamterene-Hctz) .Marland Kitchen.. 1 Tablet A Day For Blood Pressure 12)  Proair Hfa 108 (90 Base) Mcg/act Aers (Albuterol Sulfate) .... 2 Puffs Q 4 Hours As Needed For Shortness of Breath or Wheezing 13)  Cutivate 0.05 %  Lotn (Fluticasone Propionate) .... Apply To Both Legs Daily After Bath To Help With Dry Skin, Then Wrap in Ace Bandage/kerlex Dis 14)  Pravastatin Sodium 20 Mg Tabs (Pravastatin  Sodium) .... 2 Tabs By Mouth Qhs 15)  Power Wheelchair .... Indication: Morbid Obesity, Osteoarthritis, Bilateral Hip Replacements  Allergies (verified): 1)  ! * Arbs 2)  ! Ace Inhibitors 3)  ! Glucophage 4)  Lisinopril (Lisinopril)  Review of Systems       No CP, No SOB, no bowel changes, no urinary complaints, + chronic joint pains, no fever, no rashes  Physical Exam  General:  VSS Well-developed,well-nourished,in no acute distress; alert,appropriate and cooperative throughout examination Lungs:  Normal respiratory effort, chest expands symmetrically. Lungs are clear to auscultation, no crackles or wheezes. Heart:  Normal rate and regular rhythm. S1 and S2 normal without gallop, murmur, click, rub or other extra sounds. Extremities:  left leg wrapped with special dressing from wound care center- unable to assess left leg.  5mm diameter sized opening in right middle shin next to old scar from orthopedic operative site.  small amount of clear draninage. no pus. no redness Neurologic:  alert & oriented X3.   Skin:  Intact without suspicious lesions or rashes   Impression & Recommendations:  Problem # 1:  VENOUS STASIS ULCER (ICD-454.0) Will continue to have pt see [facility] for wound care.  Will continue doxcycline as prescribed by Dr. Lajoyce Corners for right shin ulcer.  Will request records from [facility] and dr duda's office.   Orders: [facility]- Est Level  3 (16109)  Problem # 2:  DIABETES MELLITUS, TYPE II, UNCONTROLLED (ICD-250.02) Pt did not bring BG log so I am unable to fully evaluate his BG control.  Will not make any changes to regimen today.  Requested that pt bring BG log to next appt.  Will consider recheck of A1C at next visit.   His updated medication list for this problem includes:    Bayer Childrens Aspirin 81 Mg Chew (Aspirin) .Marland Kitchen... Take 1 tablet by mouth once a day    Lantus 100 Unit/ml Soln (Insulin glargine) ..... Inject 60 unit subcutaneously twice a day, disp qs 1 month    Novolog  100 Unit/ml Soln (Insulin aspart) ..... Inject 20 unit subcutaneously with  with lunch and 20 with supper. disp qs x 1 month  Orders: [facility]- Est Level  3 (60454)  Problem # 3:  OBESITY, MORBID (ICD-278.01) Offered PT consult for development of exercise plan as well as nutrition consult.  Pt states that he is not interested at this time.  Will discuss further with pt at next visit.  Orders: [facility]- Est Level  3 (09811)  Problem # 4:  Preventive Health Care (  ICD-V70.0) Will follow up in 1-2 months on above issues and at next visit.  Also need to consider drawing lab work at next visit- pt is due for lipid panel.  Needs CMET to evaluate liver b/c on statin.  need to repeat A1C to see if better control.  Also need to consider doing a depression screening.  If he is depressed this may be contributing to lack of motivation to work on obesity issue.  Also in future need to review [facility] form.  Complete Medication List: 1)  Bayer Childrens Aspirin 81 Mg Chew (Aspirin) .... Take 1 tablet by mouth once a day 2)  Tramadol Hcl 50 Mg Tabs (Tramadol hcl) .Marland Kitchen.. 1 tab by mouth two times a day for pain 3)  Flomax 0.4 Mg Cp24 (Tamsulosin hcl) .... Take 1 by mouth twice a day, use this medication instead of the once a day prescription. 4)  Furosemide 40 Mg Tabs (Furosemide) .... 2 tab by mouth daily 5)  Lantus 100 Unit/ml Soln (Insulin glargine) .... Inject 60 unit subcutaneously twice a day, disp qs 1 month 6)  Metoprolol Tartrate 100 Mg Tabs (Metoprolol tartrate) .Marland Kitchen.. 1 tablet twice a day for blood pressure, hold if pulse less than 60. 7)  Nasonex 50 Mcg/act Susp (Mometasone furoate) .... Spray 1 spray as directed twice a day 8)  Novolog 100 Unit/ml Soln (Insulin aspart) .... Inject 20 unit subcutaneously with  with lunch and 20 with supper. disp qs x 1 month 9)  Nexium 40 Mg Cpdr (Esomeprazole magnesium) .Marland Kitchen.. 1 tablet two times a day for reflux 10)  Xalatan 0.005 % Soln (Latanoprost) .Marland Kitchen.. 1 drop into both eyes for  glacoma in the evening. 11)  Triamterene-hctz 37.5-25 Mg Tabs (Triamterene-hctz) .Marland Kitchen.. 1 tablet a day for blood pressure 12)  Proair Hfa 108 (90 Base) Mcg/act Aers (Albuterol sulfate) .... 2 puffs q 4 hours as needed for shortness of breath or wheezing 13)  Cutivate 0.05 % Lotn (Fluticasone propionate) .... Apply to both legs daily after bath to help with dry skin, then wrap in ace bandage/kerlex dis 14)  Pravastatin Sodium 20 Mg Tabs (Pravastatin sodium) .... 2 tabs by mouth qhs 15)  Power Wheelchair  .... Indication: morbid obesity, osteoarthritis, bilateral hip replacements  Other Orders: Influenza Vaccine MCR (16109)  Patient Instructions: 1)  Continue to go to the wound center and follow the care plan outlined by them. 2)  Continue to work on weight loss and healthy eating.  Now that when you are ready we have a nutritionist that can meet with you to discuss weight loss.  Also physical therapy can help you develop an exercise plan that is safe for you to do at home and will help you with your weight loss.   3)  return in 1-2 months to discuss weight loss and diabetes.  Please bring blood glucose log to next appt.    Orders Added: 1)  Influenza Vaccine MCR [00025] 2)  [facility]- Est Level  3 [60454]   Immunizations Administered:  Influenza Vaccine # 1:    Vaccine Type: Fluvax MCR    Site: right deltoid    Mfr: GlaxoSmithKline    Dose: 0.5 ml    Route: IM    Given by: [name], CMA    Exp. Date: 06/26/2011    Lot #: [id number]    VIS given: 07/24/10 version given October 24, 2010.  Flu Vaccine Consent Questions:    Do you have a history of severe allergic reactions  to this vaccine? no    Any prior history of allergic reactions to egg and/or gelatin? no    Do you have a sensitivity to the preservative Thimersol? no    Do you have a past history of Guillan-Barre Syndrome? no    Do you currently have an acute febrile illness? no    Have you ever had a severe reaction to latex?  no    Vaccine information given and explained to patient? yes   Immunizations Administered:  Influenza Vaccine # 1:    Vaccine Type: Fluvax MCR    Site: right deltoid    Mfr: GlaxoSmithKline    Dose: 0.5 ml    Route: IM    Given by: [name], CMA    Exp. Date: 06/26/2011    Lot #: [id number]    VIS given: 07/24/10 version given October 24, 2010.

## 2011-01-31 NOTE — Assessment & Plan Note (Signed)
Summary: f/u dm,df   Vital Signs:  Patient profile:   70 year old male Height:      72 inches Temp:     98.0 degrees F oral Pulse rate:   58 / minute BP sitting:   112 / 70  (left arm) Cuff size:   large  Vitals Entered By: Arlyss Repress CMA, (May 03, 2010 1:36 PM) CC: F/U DM Is Patient Diabetic? Yes Pain Assessment Patient in pain? no        Primary Care Provider:  Ardeen Garland  MD  CC:  F/U DM.  History of Present Illness: [name] comes in today for f/u of chronic conditions 1) DM - feels better when sugars run 125-150 than closer to 100.  Gets trelstar for prostate cancer q 6 months - Aug and Feb.  A1C worse today - has had trelstar since last A1C.  Finds that trelstar tends to increase sugars by about 50.  Starting to come back down now.  Taking Lantus 70 units two times a day and novolog 35 units with lunch and dinner.  No hypoglycemic events.  2) HTN- BP at goal.  On toprol, lasix, triamterene.  Due for BMET today 3) HLD - at goal.  Recheck in August 4) Constipation - not going daily anymore.  Sometimes every other and they are little more difficulty.  No blood or black stools. 5) Mobility -  uses motorized wheelchair, which he has become too heavy for.  Denied new one but it appealing this.  He can and does use it in his home.  His truck is equipped with a carrier for it.  When he doesn't use his w/c he uses crutches but cannot uses them for longer than 20 minutes at a time or to walk mroe than 100 feet.  Mobility limited by morbid obesity, hip arthritis with hip replacement, and knee arthritis.   Habits & Providers  Alcohol-Tobacco-Diet     Tobacco Status: never  Current Medications (verified): 1)  Bayer Childrens Aspirin 81 Mg Chew (Aspirin) .... Take 1 Tablet By Mouth Once A Day 2)  Tramadol Hcl 50 Mg Tabs (Tramadol Hcl) .Marland Kitchen.. 1 Tab By Mouth Two Times A Day For Pain 3)  Flomax 0.4 Mg Cp24 (Tamsulosin Hcl) .... Take 1 By Mouth Twice A Day, Use This Medication Instead  of The Once A Day Prescription. 4)  Furosemide 40 Mg Tabs (Furosemide) .... 2 Tab By Mouth Daily 5)  Lantus 100 Unit/ml Soln (Insulin Glargine) .... Inject 60 Unit Subcutaneously Twice A Day, Disp Qs 1 Month 6)  Metoprolol Tartrate 100 Mg  Tabs (Metoprolol Tartrate) .Marland Kitchen.. 1 Tablet Twice A Day For Blood Pressure, Hold If Pulse Less Than 60. 7)  Nasonex 50 Mcg/act Susp (Mometasone Furoate) .... Spray 1 Spray As Directed Twice A Day 8)  Novolog 100 Unit/ml Soln (Insulin Aspart) .... Inject 20 Unit Subcutaneously With  With Lunch and 20 With Supper. Disp Qs X 1 Month 9)  Nexium 40 Mg Cpdr (Esomeprazole Magnesium) .Marland Kitchen.. 1 Tablet Two Times A Day For Reflux 10)  Xalatan 0.005 %  Soln (Latanoprost) .Marland Kitchen.. 1 Drop Into Both Eyes For Glacoma in The Evening. 11)  Triamterene-Hctz 37.5-25 Mg  Tabs (Triamterene-Hctz) .Marland Kitchen.. 1 Tablet A Day For Blood Pressure 12)  Proair Hfa 108 (90 Base) Mcg/act Aers (Albuterol Sulfate) .... 2 Puffs Q 4 Hours As Needed For Shortness of Breath or Wheezing 13)  Cutivate 0.05 %  Lotn (Fluticasone Propionate) .... Apply To Both Legs Daily  After Bath To Help With Dry Skin, Then Wrap in Ace Bandage/kerlex Dis 14)  Pravastatin Sodium 20 Mg Tabs (Pravastatin Sodium) .... 2 Tabs By Mouth Qhs 15)  Power Wheelchair .... Indication: Morbid Obesity, Osteoarthritis, Bilateral Hip Replacements  Allergies: 1)  ! * Arbs 2)  ! Ace Inhibitors 3)  ! Glucophage 4)  Lisinopril (Lisinopril)  Physical Exam  General:  morbidly obese, alert, in NAD vitals reviewed Eyes:  conjunctiva clear, no injection Lungs:  Normal respiratory effort, chest expands symmetrically. Lungs are clear to auscultation, no crackles or wheezes. Heart:  Normal rate and regular rhythm. S1 and S2 normal without gallop, murmur, click, rub or other extra sounds. Pulses:  2+ radia  Diabetes Management Exam:    Foot Exam (with socks and/or shoes not present):       Sensory-Pinprick/Light touch:          Left medial foot  (L-4): diminished          Left dorsal foot (L-5): diminished          Left lateral foot (S-1): diminished          Right medial foot (L-4): diminished          Right dorsal foot (L-5): diminished          Right lateral foot (S-1): diminished       Sensory-Monofilament:          Left foot: diminished          Right foot: diminished       Inspection:          Left foot: abnormal             Comments: thickened, scaly, callused          Right foot: abnormal             Comments: thickened, scaly, callused       Nails:          Left foot: thickened          Right foot: thickened   Impression & Recommendations:  Problem # 1:  DIABETES MELLITUS, TYPE II, UNCONTROLLED (ICD-250.02) Assessment Deteriorated A1C worsened, likely secondary to trelstar injection.  Patient has already increased hsi insulin as stated in HPI.  changed in meds section of centricity.  completed paperwork for diabetic shoes.  RTC in 3 months.  His updated medication list for this problem includes:    Bayer Childrens Aspirin 81 Mg Chew (Aspirin) .Marland Kitchen... Take 1 tablet by mouth once a day    Lantus 100 Unit/ml Soln (Insulin glargine) ..... Inject 60 unit subcutaneously twice a day, disp qs 1 month    Novolog 100 Unit/ml Soln (Insulin aspart) ..... Inject 20 unit subcutaneously with  with lunch and 20 with supper. disp qs x 1 month  Orders: A1C-[facility] (04540) Basic Met-[facility] (98119-14782) [facility]- Est  Level 4 (95621)  Problem # 2:  HYPERTENSION (ICD-401.9) Assessment: Improved Well-controlled.  DUe for BMET today.  His updated medication list for this problem includes:    Furosemide 40 Mg Tabs (Furosemide) .Marland Kitchen... 2 tab by mouth daily    Metoprolol Tartrate 100 Mg Tabs (Metoprolol tartrate) .Marland Kitchen... 1 tablet twice a day for blood pressure, hold if pulse less than 60.    Triamterene-hctz 37.5-25 Mg Tabs (Triamterene-hctz) .Marland Kitchen... 1 tablet a day for blood pressure  Orders: Basic Met-[facility] (30865-78469) [facility]- Est  Level 4  (62952)  Problem # 3:  HYPERLIPIDEMIA (ICD-272.4) Assessment: Unchanged  Stable.  At goal.  His updated medication list for this problem includes:    Pravastatin Sodium 20 Mg Tabs (Pravastatin sodium) .Marland Kitchen... 2 tabs by mouth qhs  Orders: [facility]- Est  Level 4 (30865)  Complete Medication List: 1)  Bayer Childrens Aspirin 81 Mg Chew (Aspirin) .... Take 1 tablet by mouth once a day 2)  Tramadol Hcl 50 Mg Tabs (Tramadol hcl) .Marland Kitchen.. 1 tab by mouth two times a day for pain 3)  Flomax 0.4 Mg Cp24 (Tamsulosin hcl) .... Take 1 by mouth twice a day, use this medication instead of the once a day prescription. 4)  Furosemide 40 Mg Tabs (Furosemide) .... 2 tab by mouth daily 5)  Lantus 100 Unit/ml Soln (Insulin glargine) .... Inject 60 unit subcutaneously twice a day, disp qs 1 month 6)  Metoprolol Tartrate 100 Mg Tabs (Metoprolol tartrate) .Marland Kitchen.. 1 tablet twice a day for blood pressure, hold if pulse less than 60. 7)  Nasonex 50 Mcg/act Susp (Mometasone furoate) .... Spray 1 spray as directed twice a day 8)  Novolog 100 Unit/ml Soln (Insulin aspart) .... Inject 20 unit subcutaneously with  with lunch and 20 with supper. disp qs x 1 month 9)  Nexium 40 Mg Cpdr (Esomeprazole magnesium) .Marland Kitchen.. 1 tablet two times a day for reflux 10)  Xalatan 0.005 % Soln (Latanoprost) .Marland Kitchen.. 1 drop into both eyes for glacoma in the evening. 11)  Triamterene-hctz 37.5-25 Mg Tabs (Triamterene-hctz) .Marland Kitchen.. 1 tablet a day for blood pressure 12)  Proair Hfa 108 (90 Base) Mcg/act Aers (Albuterol sulfate) .... 2 puffs q 4 hours as needed for shortness of breath or wheezing 13)  Cutivate 0.05 % Lotn (Fluticasone propionate) .... Apply to both legs daily after bath to help with dry skin, then wrap in ace bandage/kerlex dis 14)  Pravastatin Sodium 20 Mg Tabs (Pravastatin sodium) .... 2 tabs by mouth qhs 15)  Power Wheelchair  .... Indication: morbid obesity, osteoarthritis, bilateral hip replacements  Laboratory Results   Blood Tests    Date/Time Received: May 03, 2010 1:38 PM  Date/Time Reported: May 03, 2010 1:53 PM   HGBA1C: 9.4%   (Normal Range: Non-Diabetic - 3-6%   Control Diabetic - 6-8%)  Comments: ...............test performed by......Marland KitchenBonnie A. [place], MLS (ASCP)cm

## 2011-01-31 NOTE — Miscellaneous (Signed)
  Clinical Lists Changes  Problems: Removed problem of UNSPECIFIED PROBLEMS W/LIMBS AND OTHER PROBLEMS (ICD-V49.9)      Allergies: 1)  ! * Arbs 2)  ! Ace Inhibitors 3)  ! Glucophage 4)  Lisinopril (Lisinopril)

## 2011-01-31 NOTE — Assessment & Plan Note (Signed)
Summary: diabetes/leg ulcer/htn   Vital Signs:  Patient profile:   70 year old male Weight:      417 pounds BMI:     56.76 Temp:     98 degrees F Pulse rate:   90 / minute BP sitting:   117 / 66  (left arm)  Vitals Entered By: Theresia Lo RN (January 23, 2011 1:43 PM) CC: check up and paper for MD to fill out Is Patient Diabetic? Yes Pain Assessment Patient in pain? no        Primary Care Provider:  Ellin Mayhew MD  CC:  check up and paper for MD to fill out.  History of Present Illness: Diabetes: Pt brought BG log in today.  BG levels in log book range from 100-130 fasting and 100-140 pp.  He doesn't have any evening bg levels, or 2am checks.  He  He states that he isn't sure if his machine is working correctly.  He compared his machine against another machine of a friend and his seemed to be "off."  He states that he is hasn't used the control solution to check his machine.  Is requesting new machine.  BP: 117/66 today.  Pt states that he is taking all medications as directed.  obesity: pt states "I eat very little but I don't seem to lose weight"  he also states that his mobility is decreased 2/2 arthritis and right lower ext injury--this keeps him from being able to exercise.   Pt states that he would be open to meeting with the nutritionist if he could do this in conjunction with his next visit.   hyperlipidemia: pt is due for recheck of lipids.  not fasting today so will delay to next appt.   right lower ext chronic ulcer/osteomylitis: per Dr. Lajoyce Corners note amputation was recommended since pt has had long hx of chronic infection/osteomylitis.  Pt does not want to have this done.  Pt states that Dr. Lajoyce Corners is no longer following.  Per dr. Audrie Lia note pt refused f/up appts and amputation and just wanted antibiotics.    He states that the area is the size of a pencil eraser and is not draining anything currently.  he denies fever, chills, increased drainage, or increase in  size of wound.     Habits & Providers  Alcohol-Tobacco-Diet     Tobacco Status: never  Current Medications (verified): 1)  Bayer Childrens Aspirin 81 Mg Chew (Aspirin) .... Take 1 Tablet By Mouth Once A Day 2)  Tramadol Hcl 50 Mg Tabs (Tramadol Hcl) .Marland Kitchen.. 1 Tab By Mouth Two Times A Day For Pain 3)  Flomax 0.4 Mg Cp24 (Tamsulosin Hcl) .... Take 1 By Mouth Twice A Day, Use This Medication Instead of The Once A Day Prescription. 4)  Furosemide 40 Mg Tabs (Furosemide) .... 2 Tab By Mouth Daily 5)  Lantus 100 Unit/ml Soln (Insulin Glargine) .... Inject 60 Unit Subcutaneously Twice A Day, Disp Qs 1 Month 6)  Metoprolol Tartrate 100 Mg  Tabs (Metoprolol Tartrate) .Marland Kitchen.. 1 Tablet Twice A Day For Blood Pressure, Hold If Pulse Less Than 60. 7)  Nasonex 50 Mcg/act Susp (Mometasone Furoate) .... Spray 1 Spray As Directed Twice A Day 8)  Novolog 100 Unit/ml Soln (Insulin Aspart) .... Inject 20 Unit Subcutaneously With  With Lunch and 20 With Supper. Disp Qs X 1 Month 9)  Nexium 40 Mg Cpdr (Esomeprazole Magnesium) .Marland Kitchen.. 1 Tablet Two Times A Day For Reflux 10)  Xalatan 0.005 %  Soln (  Latanoprost) .Marland Kitchen.. 1 Drop Into Both Eyes For Glacoma in The Evening. 11)  Triamterene-Hctz 37.5-25 Mg  Tabs (Triamterene-Hctz) .Marland Kitchen.. 1 Tablet A Day For Blood Pressure 12)  Proair Hfa 108 (90 Base) Mcg/act Aers (Albuterol Sulfate) .... 2 Puffs Q 4 Hours As Needed For Shortness of Breath or Wheezing 13)  Cutivate 0.05 %  Lotn (Fluticasone Propionate) .... Apply To Both Legs Daily After Bath To Help With Dry Skin, Then Wrap in Ace Bandage/kerlex Dis 14)  Pravastatin Sodium 20 Mg Tabs (Pravastatin Sodium) .... 2 Tabs By Mouth Qhs 15)  Power Wheelchair .... Indication: Morbid Obesity, Osteoarthritis, Bilateral Hip Replacements  Allergies (verified): 1)  ! * Arbs 2)  ! Ace Inhibitors 3)  ! Glucophage 4)  Lisinopril (Lisinopril)  Review of Systems  The patient denies anorexia, fever, chest pain, syncope, and prolonged cough.     Physical Exam  General:  VSS Well-developed,well-nourished,in no acute distress; alert,appropriate and cooperative throughout examination Lungs:  Normal respiratory effort, chest expands symmetrically. Lungs are clear to auscultation, no crackles or wheezes. Heart:  Normal rate and regular rhythm. S1 and S2 normal.  + systolic murmur. without gallop,click, rub or other extra sounds. Abdomen:  obese, soft, nontender Msk:  walking with crutches  Extremities:  1+ edema, thickened skin in lower ext see diabetic foot exam small pencil eraser size ulcer in right medial shin area- approx 3-26mm in depth. no drainage, no foul odor, pink granulation tissue present in bed and walls of ulcer.  no surrounding erythema.  Chronic cystic nodules present on shins bilateral.  Neurologic:  see diabetic foot exam  Diabetes Management Exam:    Foot Exam (with socks and/or shoes not present):       Sensory-Pinprick/Light touch:          Left medial foot (L-4): normal          Left dorsal foot (L-5): normal          Left lateral foot (S-1): normal          Right medial foot (L-4): normal          Right dorsal foot (L-5): normal          Right lateral foot (S-1): normal       Sensory-Monofilament:          Left foot: diminished          Right foot: normal       Sensory-other: dimished sensation in heel area of left foot.  Otherwise normal monofilament exam.       Inspection:          Left foot: normal          Right foot: normal       Nails:          Left foot: thickened          Right foot: thickened   Impression & Recommendations:  Problem # 1:  DIABETES MELLITUS, TYPE II, UNCONTROLLED (ICD-250.02) Pt is to use control solution to check accuracy of machine.  He is requesting new machine.  He is to call his insurance company to see if they will cover it.  If they will I will write for a new machine.  BG logbook numbers had very little variation in them all within 100-140.  And this is a big  contrast to his A1C of 9.9.  This seems to indicate very high elevations that are isolated to the evening and nighttime, machine not being  accurate, or pt falsifying numbers in bg log book.  Pt is to check machine as described above.  Take evening BG levels and 2am levels and call me back in 2 weeks to give me the readings.  (my first available appt is in 4 weeks from now)  At that time will decide how best to change insulin regimen to improve bg control.  discussed need to get uptodate on eye and dental appts.     His updated medication list for this problem includes:    Bayer Childrens Aspirin 81 Mg Chew (Aspirin) .Marland Kitchen... Take 1 tablet by mouth once a day    Lantus 100 Unit/ml Soln (Insulin glargine) ..... Inject 60 unit subcutaneously twice a day, disp qs 1 month    Novolog 100 Unit/ml Soln (Insulin aspart) ..... Inject 20 unit subcutaneously with  with lunch and 20 with supper. disp qs x 1 month  Orders: [facility]C-[facility] ([address]) [facility]- Est  Level 4 (60454)  Problem # 2:  HYPERTENSION (ICD-401.9) continue current regimen  His updated medication list for this problem includes:    Furosemide 40 Mg Tabs (Furosemide) .Marland Kitchen... 2 tab by mouth daily    Metoprolol Tartrate 100 Mg Tabs (Metoprolol tartrate) .Marland Kitchen... 1 tablet twice a day for blood pressure, hold if pulse less than 60.    Triamterene-hctz 37.5-25 Mg Tabs (Triamterene-hctz) .Marland Kitchen... 1 tablet a day for blood pressure  Orders: [facility]- Est  Level 4 (09811)  Problem # 3:  OBESITY, MORBID (ICD-278.01) Pt agrees to meet with nutritionist to discuss nutrition that will maximize blood glucose control, healing of chronic ulcer/infection of right leg, and weight loss.   Orders: [facility]- Est  Level 4 (91478)  Problem # 4:  HYPERLIPIDEMIA (ICD-272.4) Pt is to make f/up appt in 1 month in the AM so we can do fasting lipid panel.   His updated medication list for this problem includes:    Pravastatin Sodium 20 Mg Tabs (Pravastatin sodium) .Marland Kitchen... 2 tabs by mouth  qhs  Problem # 5:  CHRONIC OSTEOMYELITIS, LOWER LEG (ICD-730.16) recurrent ulcer, size/appearance similiar to last visit.  We reviewed Dr. Audrie Lia recommendation. Pt still continues to refuse amputation.  We discussed how he is at high risk for worsening infection or systemic infection that could be fatal.  he states understanding.  He is aware that he should go to the ER if any worsening of the ulcer, drainiage, or systemic fever.  Will need to continue to readress at pt visits to ensure that pt understands the severity of his decision.   Complete Medication List: 1)  Bayer Childrens Aspirin 81 Mg Chew (Aspirin) .... Take 1 tablet by mouth once a day 2)  Tramadol Hcl 50 Mg Tabs (Tramadol hcl) .Marland Kitchen.. 1 tab by mouth two times a day for pain 3)  Flomax 0.4 Mg Cp24 (Tamsulosin hcl) .... Take 1 by mouth twice a day, use this medication instead of the once a day prescription. 4)  Furosemide 40 Mg Tabs (Furosemide) .... 2 tab by mouth daily 5)  Lantus 100 Unit/ml Soln (Insulin glargine) .... Inject 60 unit subcutaneously twice a day, disp qs 1 month 6)  Metoprolol Tartrate 100 Mg Tabs (Metoprolol tartrate) .Marland Kitchen.. 1 tablet twice a day for blood pressure, hold if pulse less than 60. 7)  Nasonex 50 Mcg/act Susp (Mometasone furoate) .... Spray 1 spray as directed twice a day 8)  Novolog 100 Unit/ml Soln (Insulin aspart) .... Inject 20 unit subcutaneously with  with lunch and 20 with supper. disp  qs x 1 month 9)  Nexium 40 Mg Cpdr (Esomeprazole magnesium) .Marland Kitchen.. 1 tablet two times a day for reflux 10)  Xalatan 0.005 % Soln (Latanoprost) .Marland Kitchen.. 1 drop into both eyes for glacoma in the evening. 11)  Triamterene-hctz 37.5-25 Mg Tabs (Triamterene-hctz) .Marland Kitchen.. 1 tablet a day for blood pressure 12)  Proair Hfa 108 (90 Base) Mcg/act Aers (Albuterol sulfate) .... 2 puffs q 4 hours as needed for shortness of breath or wheezing 13)  Cutivate 0.05 % Lotn (Fluticasone propionate) .... Apply to both legs daily after bath to help  with dry skin, then wrap in ace bandage/kerlex dis 14)  Pravastatin Sodium 20 Mg Tabs (Pravastatin sodium) .... 2 tabs by mouth qhs 15)  Power Wheelchair  .... Indication: morbid obesity, osteoarthritis, bilateral hip replacements  Patient Instructions: 1)  Please continue to check fasting blood sugars. 2)  Please start checking a blood sugar at 2 hours after supper or at bedtime.   3)  Please check a 2 am blood sugar 2-3 x prior to your follow up appt with me. 4)  use the control solution to check the accuracy of your machine. 5)  check to make sure the control solution isn't expired.  6)  find out if your insurance will pay for a new machine- if they will let me know which machine to write for.  7)  return in 2 weeks to follow up on diabetes to decide what to do with insulin.  8)  make an appt with nutrition to discuss nutrition that will promote left leg ulcer healing, weight loss, and better blood sugar control 9)  Front desk: pt would like to make an appt with me on the same day as he sees nutrition.Marland KitchenMarland KitchenCan you please help arrange this?   Orders Added: 1)  A1C-[facility] [83036] 2)  [facility]- Est  Level 4 [36644]    Laboratory Results   Blood Tests   Date/Time Received: January 23, 2011 1:45 PM  Date/Time Reported: January 23, 2011 2:00 PM   HGBA1C: 9.9%   (Normal Range: Non-Diabetic - 3-6%   Control Diabetic - 6-8%)  Comments: ...............test performed by......Marland KitchenBonnie A. [place], MLS (ASCP)cm

## 2011-01-31 NOTE — Miscellaneous (Signed)
Summary: venous dopplers  INDICATION:  Ulcer.      HISTORY:  Edema:  Yes.   Trauma/Surgery:  No.   Pain:  No.   PE:  No.   Previous DVT:  No.   Anticoagulants:   Other:      DUPLEX EXAM:                 CFV   SFV   PopV  PTV    GSV                 R  L  R  L  R  L  R   L  R  L   Thrombosis    o  o  o  o  o  o  o   o  o  o   Spontaneous   +  +  +  +  +  +  +   +  +  +   Phasic        +  +  +  +  +  +  +   +  +  +   Augmentation  +  +  +  +  +  +  +   +  +  +   Compressible  +  +  +  +  +  +  +   +  +  +   Competent     o  o  +  +  o  o  +   +  +  +      Legend:  + - yes  o - no  p - partial  D - decreased      IMPRESSION:   1. No evidence of deep or superficial venous thrombosis noted in the       bilateral lower extremities.   2. Clinically significant venous reflux noted in the bilateral common       femoral and popliteal veins.         _____________________________   Quita Skye Hart [place], M.D.      CH/MEDQ  D:  10/03/2010  T:  10/03/2010  Job:  [id number]

## 2011-01-31 NOTE — Progress Notes (Signed)
Summary: phn msg  Phone Note Call from Patient Call back at Home Phone [phone]   Caller: Patient Summary of Call: is asking paperwork about his scooter - has a flat on it. Initial call taken by: De Nurse,  December 10, 2010 2:10 PM  Follow-up for Phone Call        called pt back and discussed his needs- forms completed and placed in the fax pile to be sent to medical supply company.  Ellin Mayhew MD  December 14, 2010 2:54 PM

## 2011-01-31 NOTE — Progress Notes (Signed)
[facility]   Imported By: Marily Memos 11/29/2010 14:38:41  _____________________________________________________________________  External Attachment:    Type:   Image     Comment:   External Document

## 2011-01-31 NOTE — Miscellaneous (Signed)
Summary: needs OV  Clinical Lists Changes rec'd forms for diabetic shoes. last OV for DM was 01/29/10 with elevated A1c. called & LM for him to call back. when he does Dr. Georgiana Shore has openings on 5*9*11.[place] RN  April 27, 2010 12:14 PM  pt called back and has appt w/ Rourke Mcquitty 05/03/10 De Nurse  April 27, 2010 1:53 PM

## 2011-01-31 NOTE — Consult Note (Signed)
[facility]: [facility]   Imported By: De Nurse 11/07/2010 15:19:59  _____________________________________________________________________  External Attachment:    Type:   Image     Comment:   External Document

## 2011-01-31 NOTE — Miscellaneous (Signed)
Summary: Ventolin formulary change  Clinical Lists Changes  Medications: Changed medication from PROVENTIL HFA 108 (90 BASE) MCG/ACT AERS (ALBUTEROL SULFATE) 2 puffs inhaled every 4 hrs if needed for wheezing or shortness of breath to PROAIR HFA 108 (90 BASE) MCG/ACT AERS (ALBUTEROL SULFATE) 2 puffs q 4 hours as needed for shortness of breath or wheezing - Signed Rx of PROAIR HFA 108 (90 BASE) MCG/ACT AERS (ALBUTEROL SULFATE) 2 puffs q 4 hours as needed for shortness of breath or wheezing;  #1 x 11;  Signed;  Entered by: Ardeen Garland  MD;  Authorized by: Ardeen Garland  MD;  Method used: Electronically to [facility] [id number], [place], [place], [place]  [address], Ph: [phone], Fax: [phone]    Prescriptions: PROAIR HFA 108 (90 BASE) MCG/ACT AERS (ALBUTEROL SULFATE) 2 puffs q 4 hours as needed for shortness of breath or wheezing  #1 x 11   Entered and Authorized by:   Ardeen Garland  MD   Signed by:   Ardeen Garland  MD on 03/22/2010   Method used:   Electronically to        [facility] [id number] (retail)       [address]       [place], [place]  [address]       Ph: [phone]       Fax: [phone]   RxID:   [id number]

## 2011-01-31 NOTE — Assessment & Plan Note (Signed)
Summary: f/up,tcb   Vital Signs:  Patient profile:   70 year old male Temp:     97.7 degrees F oral Pulse rate:   96 / minute BP sitting:   137 / 76  (left arm) Cuff size:   large  Vitals Entered By: Arlyss Repress CMA, (January 29, 2010 9:41 AM) CC: f/u chronic diseases Is Patient Diabetic? Yes Pain Assessment Patient in pain? no        Primary Care Provider:  Ardeen Garland  MD  CC:  f/u chronic diseases.  History of Present Illness: Mr. Folts comes in for follow-up of several chronic problems. 1) DM - Last A1C 7.8.  Did not bring meter or record, but states fastings running 80 - 200.  Only checks fasting and every other day at that.  Is on trelstar for prostate CA.  Gets injection q 6 months.  Last one in August.  Next one in Feb.  Does find it elevates his sugars temporarily and he adjust his insulin.  CUrrenly taking Lantus 60 u two times a day and novolog 30 units with lunch and dinner.  tolerating well.  No hypoglycemic events.  2) HTN - BP elevated at last visit but had eaten a poor lunch.  Improved to his baseline today.   tolerating all meds well.   3) LE edema - sees Dr. Lajoyce Corners.  He has to put unna boot on again 2 weeks ago.  [facility] comes out and changes it every Tuesday.  Sees Dr. Lajoyce Corners again 02/05/10.  Not on antibiotics. 4) Hip - has hip replacement that is recalled.  Ortho following as may have to be removed and replaced but they haven't made decision yet. 5) Asthma/RAD - insurance not carrying ventolin anymore.  Needs Rx change to proventil.   Current Medications (verified): 1)  Bayer Childrens Aspirin 81 Mg Chew (Aspirin) .... Take 1 Tablet By Mouth Once A Day 2)  Tramadol Hcl 50 Mg Tabs (Tramadol Hcl) .Marland Kitchen.. 1 Tab By Mouth Two Times A Day For Pain 3)  Flomax 0.4 Mg Cp24 (Tamsulosin Hcl) .... Take 1 By Mouth Twice A Day, Use This Medication Instead of The Once A Day Prescription. 4)  Furosemide 40 Mg Tabs (Furosemide) .... 2 Tab By Mouth Daily 5)  Lantus 100  Unit/ml Soln (Insulin Glargine) .... Inject 60 Unit Subcutaneously Twice A Day, Disp Qs 1 Month 6)  Metoprolol Tartrate 100 Mg  Tabs (Metoprolol Tartrate) .Marland Kitchen.. 1 Tablet Twice A Day For Blood Pressure, Hold If Pulse Less Than 60. 7)  Nasonex 50 Mcg/act Susp (Mometasone Furoate) .... Spray 1 Spray As Directed Twice A Day 8)  Novolog 100 Unit/ml Soln (Insulin Aspart) .... Inject 20 Unit Subcutaneously With  With Lunch and 20 With Supper. Disp Qs X 1 Month 9)  Nexium 40 Mg Cpdr (Esomeprazole Magnesium) .Marland Kitchen.. 1 Tablet Two Times A Day For Reflux 10)  Xalatan 0.005 %  Soln (Latanoprost) .Marland Kitchen.. 1 Drop Into Both Eyes For Glacoma in The Evening. 11)  Triamterene-Hctz 37.5-25 Mg  Tabs (Triamterene-Hctz) .Marland Kitchen.. 1 Tablet A Day For Blood Pressure 12)  Proventil Hfa 108 (90 Base) Mcg/act Aers (Albuterol Sulfate) .... 2 Puffs Inhaled Every 4 Hrs If Needed For Wheezing or Shortness of Breath 13)  Cutivate 0.05 %  Lotn (Fluticasone Propionate) .... Apply To Both Legs Daily After Bath To Help With Dry Skin, Then Wrap in Ace Bandage/kerlex Dis 14)  Pravastatin Sodium 20 Mg Tabs (Pravastatin Sodium) .... 2 Tabs By Mouth  Qhs 15)  Power Wheelchair .... Indication: Morbid Obesity, Osteoarthritis, Bilateral Hip Replacements  Allergies: 1)  ! * Arbs 2)  ! Ace Inhibitors 3)  ! Glucophage 4)  Lisinopril (Lisinopril)  Physical Exam  General:  morbidly obese, alert, in NAD vitals reviewed Eyes:  conjunctiva clear, no injection Lungs:  Normal respiratory effort, chest expands symmetrically. Lungs are clear to auscultation, no crackles or wheezes. Heart:  Normal rate and regular rhythm. S1 and S2 normal without gallop, murmur, click, rub or other extra sounds. Extremities:  left lower extremity in unna boot   Impression & Recommendations:  Problem # 1:  DIABETES MELLITUS, TYPE II, UNCONTROLLED (ICD-250.02) Assessment Unchanged A1C slightly worsened, but still in range of 7-9, which is his goal with his other  comorbid conditions.  No change to regimen at this time.  Patient is good with his insulin and adjusts up after trelstar injections.  Next one is in 1 month.  His updated medication list for this problem includes:    Bayer Childrens Aspirin 81 Mg Chew (Aspirin) .Marland Kitchen... Take 1 tablet by mouth once a day    Lantus 100 Unit/ml Soln (Insulin glargine) ..... Inject 60 unit subcutaneously twice a day, disp qs 1 month    Novolog 100 Unit/ml Soln (Insulin aspart) ..... Inject 20 unit subcutaneously with  with lunch and 20 with supper. disp qs x 1 month  Orders: A1C-[facility] (65784) [facility]- Est  Level 4 (69629)  Problem # 2:  HYPERTENSION (ICD-401.9) Assessment: Unchanged  Continue current regimen His updated medication list for this problem includes:    Furosemide 40 Mg Tabs (Furosemide) .Marland Kitchen... 2 tab by mouth daily    Metoprolol Tartrate 100 Mg Tabs (Metoprolol tartrate) .Marland Kitchen... 1 tablet twice a day for blood pressure, hold if pulse less than 60.    Triamterene-hctz 37.5-25 Mg Tabs (Triamterene-hctz) .Marland Kitchen... 1 tablet a day for blood pressure  Orders: [facility]- Est  Level 4 (99214)  Problem # 3:  VENOUS INSUFFICIENCY, CHRONIC (ICD-459.81) Assessment: Deteriorated  Currently worsened on left.  Being followed by Dr. Lajoyce Corners and [facility].  unna boot in place currently.  No antibiotics.  No fevers.   Orders: [facility]- Est  Level 4 (52841)  Complete Medication List: 1)  Bayer Childrens Aspirin 81 Mg Chew (Aspirin) .... Take 1 tablet by mouth once a day 2)  Tramadol Hcl 50 Mg Tabs (Tramadol hcl) .Marland Kitchen.. 1 tab by mouth two times a day for pain 3)  Flomax 0.4 Mg Cp24 (Tamsulosin hcl) .... Take 1 by mouth twice a day, use this medication instead of the once a day prescription. 4)  Furosemide 40 Mg Tabs (Furosemide) .... 2 tab by mouth daily 5)  Lantus 100 Unit/ml Soln (Insulin glargine) .... Inject 60 unit subcutaneously twice a day, disp qs 1 month 6)  Metoprolol Tartrate 100 Mg Tabs (Metoprolol tartrate) .Marland Kitchen.. 1  tablet twice a day for blood pressure, hold if pulse less than 60. 7)  Nasonex 50 Mcg/act Susp (Mometasone furoate) .... Spray 1 spray as directed twice a day 8)  Novolog 100 Unit/ml Soln (Insulin aspart) .... Inject 20 unit subcutaneously with  with lunch and 20 with supper. disp qs x 1 month 9)  Nexium 40 Mg Cpdr (Esomeprazole magnesium) .Marland Kitchen.. 1 tablet two times a day for reflux 10)  Xalatan 0.005 % Soln (Latanoprost) .Marland Kitchen.. 1 drop into both eyes for glacoma in the evening. 11)  Triamterene-hctz 37.5-25 Mg Tabs (Triamterene-hctz) .Marland Kitchen.. 1 tablet a day for blood pressure 12)  Proventil Hfa 108 (90 Base) Mcg/act Aers (Albuterol sulfate) .... 2 puffs inhaled every 4 hrs if needed for wheezing or shortness of breath 13)  Cutivate 0.05 % Lotn (Fluticasone propionate) .... Apply to both legs daily after bath to help with dry skin, then wrap in ace bandage/kerlex dis 14)  Pravastatin Sodium 20 Mg Tabs (Pravastatin sodium) .... 2 tabs by mouth qhs 15)  Power Wheelchair  .... Indication: morbid obesity, osteoarthritis, bilateral hip replacements Prescriptions: PROVENTIL HFA 108 (90 BASE) MCG/ACT AERS (ALBUTEROL SULFATE) 2 puffs inhaled every 4 hrs if needed for wheezing or shortness of breath  #1 x 11   Entered and Authorized by:   Ardeen Garland  MD   Signed by:   Ardeen Garland  MD on 01/29/2010   Method used:   Faxed to ...       [facility] (retail)       [address]       [address]       [place], [place]  [address]       Ph: [phone]       Fax: [phone]   RxID:   [id number]   Laboratory Results   Blood Tests   Date/Time Received: January 29, 2010 9:44 AM  Date/Time Reported: January 29, 2010 10:01 AM   HGBA1C: 8.4%   (Normal Range: Non-Diabetic - 3-6%   Control Diabetic - 6-8%)  Comments: ...........test performed by...........Marland KitchenTerese Door, CMA

## 2011-01-31 NOTE — Assessment & Plan Note (Signed)
Summary: sore on left leg   Vital Signs:  Patient profile:   70 year old male Height:      72 inches Weight:      414 pounds BMI:     56.35 Temp:     98.3 degrees F oral Pulse rate:   72 / minute BP sitting:   127 / 77  (right arm)  Vitals Entered By: Arlyss Repress CMA, (September 13, 2010 8:35 AM) CC: sore on leg.  Is Patient Diabetic? Yes   Primary Care Provider:  Ardeen Garland  MD  CC:  sore on leg. Marland Kitchen  History of Present Illness: 2 small ulcers on back of left leg: Pt states that they have been present for multiple weeks.  He doesn't know when they started.  He states that they will heal up and scab over then his pants or "something" will brush over it, pull off the scab, and it will be pink and open again.  No drainage, no foul odor, no pain at site of ulcers.  Pt states that he has had ulcers in this area in the past and was seen by the wound care clinic.    DM:  Pt did not bring BG log to today's visit.  He states that he is taking all medications as directed and is not having any problems.   Current Medications (verified): 1)  Bayer Childrens Aspirin 81 Mg Chew (Aspirin) .... Take 1 Tablet By Mouth Once A Day 2)  Tramadol Hcl 50 Mg Tabs (Tramadol Hcl) .Marland Kitchen.. 1 Tab By Mouth Two Times A Day For Pain 3)  Flomax 0.4 Mg Cp24 (Tamsulosin Hcl) .... Take 1 By Mouth Twice A Day, Use This Medication Instead of The Once A Day Prescription. 4)  Furosemide 40 Mg Tabs (Furosemide) .... 2 Tab By Mouth Daily 5)  Lantus 100 Unit/ml Soln (Insulin Glargine) .... Inject 60 Unit Subcutaneously Twice A Day, Disp Qs 1 Month 6)  Metoprolol Tartrate 100 Mg  Tabs (Metoprolol Tartrate) .Marland Kitchen.. 1 Tablet Twice A Day For Blood Pressure, Hold If Pulse Less Than 60. 7)  Nasonex 50 Mcg/act Susp (Mometasone Furoate) .... Spray 1 Spray As Directed Twice A Day 8)  Novolog 100 Unit/ml Soln (Insulin Aspart) .... Inject 20 Unit Subcutaneously With  With Lunch and 20 With Supper. Disp Qs X 1 Month 9)  Nexium 40 Mg  Cpdr (Esomeprazole Magnesium) .Marland Kitchen.. 1 Tablet Two Times A Day For Reflux 10)  Xalatan 0.005 %  Soln (Latanoprost) .Marland Kitchen.. 1 Drop Into Both Eyes For Glacoma in The Evening. 11)  Triamterene-Hctz 37.5-25 Mg  Tabs (Triamterene-Hctz) .Marland Kitchen.. 1 Tablet A Day For Blood Pressure 12)  Proair Hfa 108 (90 Base) Mcg/act Aers (Albuterol Sulfate) .... 2 Puffs Q 4 Hours As Needed For Shortness of Breath or Wheezing 13)  Cutivate 0.05 %  Lotn (Fluticasone Propionate) .... Apply To Both Legs Daily After Bath To Help With Dry Skin, Then Wrap in Ace Bandage/kerlex Dis 14)  Pravastatin Sodium 20 Mg Tabs (Pravastatin Sodium) .... 2 Tabs By Mouth Qhs 15)  Power Wheelchair .... Indication: Morbid Obesity, Osteoarthritis, Bilateral Hip Replacements  Allergies (verified): 1)  ! * Arbs 2)  ! Ace Inhibitors 3)  ! Glucophage 4)  Lisinopril (Lisinopril)  Review of Systems       No CP, No SOB, No leg pain.  Physical Exam  General:  VSS Well-developed,well-nourished,in no acute distress; alert,appropriate and cooperative throughout examination Lungs:  Normal respiratory effort, chest expands symmetrically.  Extremities:  Ichthyosis/ dry scaly skin present over lower extremities bilateral.  Thickened, hypopigmented skin  on lower ext.  mulitple Large golf sized fluctuant masses (chronic) present on shins bilateral.  2 small dime sized shallow, pink, nondraining ulcers present on back of lateral left calf area.  No ulcers present anywhere else on left lower ext.    Impression & Recommendations:  Problem # 1:  VENOUS STASIS ULCER (ICD-454.0) Will refer pt to wound care clinic for a RN to assess and help develop plan to treat these small ulcers and prevent them from getting bigger.  Will also have them assess significant leg dryness/Ichthyosis.  has used different creams on this dry, thickened skin but without relief.    Orders: [facility]- Est Level  3 (16109) Wound Care Center Referral (Wound Care)  Problem # 2:  DIABETES  MELLITUS, TYPE II, UNCONTROLLED (ICD-250.02) Pt checking BG every other day.  Pt encouraged to check BG level daily and to write it in log book and return in 2-3 weeks for diabetes f/up and wound recheck.   His updated medication list for this problem includes:    Bayer Childrens Aspirin 81 Mg Chew (Aspirin) .Marland Kitchen... Take 1 tablet by mouth once a day    Lantus 100 Unit/ml Soln (Insulin glargine) ..... Inject 60 unit subcutaneously twice a day, disp qs 1 month    Novolog 100 Unit/ml Soln (Insulin aspart) ..... Inject 20 unit subcutaneously with  with lunch and 20 with supper. disp qs x 1 month  Orders: A1C-[facility] (60454) [facility]- Est Level  3 (09811)  Problem # 3:  Preventive Health Care (ICD-V70.0) need to review PCMH form with pt in future.  For now will focus on venous statsis issue and diabetes management.  A1C elevated at today's exam so will be a priority.  Also need to develop some sort of exercise and nutrition plan.  Once a relationship has been better established with pt may want to discuss nutrition consult as well as PT consult to help develop exercise plan that he can do given his joint issues and large body habitus.   May also want to consider ABI's in future.  Will look in chart to see if these have been done in past.    Complete Medication List: 1)  Bayer Childrens Aspirin 81 Mg Chew (Aspirin) .... Take 1 tablet by mouth once a day 2)  Tramadol Hcl 50 Mg Tabs (Tramadol hcl) .Marland Kitchen.. 1 tab by mouth two times a day for pain 3)  Flomax 0.4 Mg Cp24 (Tamsulosin hcl) .... Take 1 by mouth twice a day, use this medication instead of the once a day prescription. 4)  Furosemide 40 Mg Tabs (Furosemide) .... 2 tab by mouth daily 5)  Lantus 100 Unit/ml Soln (Insulin glargine) .... Inject 60 unit subcutaneously twice a day, disp qs 1 month 6)  Metoprolol Tartrate 100 Mg Tabs (Metoprolol tartrate) .Marland Kitchen.. 1 tablet twice a day for blood pressure, hold if pulse less than 60. 7)  Nasonex 50 Mcg/act Susp  (Mometasone furoate) .... Spray 1 spray as directed twice a day 8)  Novolog 100 Unit/ml Soln (Insulin aspart) .... Inject 20 unit subcutaneously with  with lunch and 20 with supper. disp qs x 1 month 9)  Nexium 40 Mg Cpdr (Esomeprazole magnesium) .Marland Kitchen.. 1 tablet two times a day for reflux 10)  Xalatan 0.005 % Soln (Latanoprost) .Marland Kitchen.. 1 drop into both eyes for glacoma in the evening. 11)  Triamterene-hctz 37.5-25 Mg Tabs (Triamterene-hctz) .Marland Kitchen.. 1 tablet a day  for blood pressure 12)  Proair Hfa 108 (90 Base) Mcg/act Aers (Albuterol sulfate) .... 2 puffs q 4 hours as needed for shortness of breath or wheezing 13)  Cutivate 0.05 % Lotn (Fluticasone propionate) .... Apply to both legs daily after bath to help with dry skin, then wrap in ace bandage/kerlex dis 14)  Pravastatin Sodium 20 Mg Tabs (Pravastatin sodium) .... 2 tabs by mouth qhs 15)  Power Wheelchair  .... Indication: morbid obesity, osteoarthritis, bilateral hip replacements  Patient Instructions: 1)  return in 1 month so I can recheck your leg and talk about your diabetes.  2)  continue checking BG at home. At next appt bring blood glucose log.  3)  we will call you with your appt for the wound clinic.  4)  make sure you elevate your legs to decease the swelling.  Laboratory Results   Blood Tests   Date/Time Received: September 13, 2010 8:42 AM  Date/Time Reported: September 13, 2010 8:52 AM   HGBA1C: 8.9%   (Normal Range: Non-Diabetic - 3-6%   Control Diabetic - 6-8%)  Comments: ...........test performed by...........Marland KitchenTerese Door, CMA

## 2011-03-19 ENCOUNTER — Other Ambulatory Visit: Payer: Self-pay | Admitting: Family Medicine

## 2011-03-19 NOTE — Telephone Encounter (Signed)
Pt's opthamologist prescribes pt's glaucoma medication and follows pt for this issue.

## 2011-03-19 NOTE — Telephone Encounter (Signed)
Refill request

## 2011-03-26 ENCOUNTER — Ambulatory Visit (INDEPENDENT_AMBULATORY_CARE_PROVIDER_SITE_OTHER): Payer: Medicare Other | Admitting: Family Medicine

## 2011-03-26 ENCOUNTER — Encounter: Payer: Self-pay | Admitting: Family Medicine

## 2011-03-26 DIAGNOSIS — E785 Hyperlipidemia, unspecified: Secondary | ICD-10-CM

## 2011-03-26 DIAGNOSIS — M86669 Other chronic osteomyelitis, unspecified tibia and fibula: Secondary | ICD-10-CM

## 2011-03-26 DIAGNOSIS — I10 Essential (primary) hypertension: Secondary | ICD-10-CM

## 2011-03-26 NOTE — Assessment & Plan Note (Signed)
Will recheck lipids at next appointment-- when fasting.  Not fasting at today's appointment.

## 2011-03-26 NOTE — Assessment & Plan Note (Signed)
No fever. Right lower leg ulcer appears to be partially healed.  No active drainage. Reviewed with pt signs and symptoms of active infection.

## 2011-03-26 NOTE — Progress Notes (Signed)
  Subjective:    Patient ID: [name], male    DOB: 10/10/1941, 70 y.o.   MRN: [id number]  HPI Diabetes: Pt has been keeping bg log consistently- taking lantus 70units BID and novolog 30units with lunch and supper.  No hypoglycemic events.  Some symptoms of hypoglycemia when bg levels in low 100's.  BG fasting ranges in high 100's to 200's.  Obesity: Pt did not f/up with nutritionist- states that he doesn't have enough money to pay for copay at this time.  States he will consider it at maybe a later time.  Hyperlipidemia- Pt not fasting today unable to check lipids  Right lower leg ulcer- No drainage,  states that the area seemed to be healing up, denies any redness at site.    Social hx: pt states that insurance has changed and he has to pay copay with every visit now.    Review of Systems    no fever. No changes in appetite.  As per above hpi.  Objective:   Physical Exam  Constitutional: He is oriented to person, place, and time.       Morbidly obese  Cardiovascular: Normal rate, regular rhythm and normal heart sounds.   Pulmonary/Chest: Effort normal and breath sounds normal. No respiratory distress. He has no wheezes.  Musculoskeletal:       Lower ext chronic fluid filled cysts on lower leg.  Pencil sized ulcer on right shin partially filled with scab, no drainage, no redness.    Neurological: He is alert and oriented to person, place, and time.          Assessment & Plan:

## 2011-03-26 NOTE — Assessment & Plan Note (Addendum)
Will increase lantus to 75 units bid.  Will keep meal coverage insulin at 30units at lunch and at supper.  Pt refuses nutrition consult at this time.  Will recheck pt a1c at next appointment.

## 2011-03-26 NOTE — Assessment & Plan Note (Signed)
Will keep pt bp medication regimen the same. Will follow bp closely.  Goal less than 130/80

## 2011-03-26 NOTE — Patient Instructions (Signed)
Increase lantus to 75units 2 x day.  Continue 30units of novolog with meals. Keep up the GREAT WORK with your blood glucose log Use the new glucometer and bring it in with you to your next appointment- and bring the control solution if you can find it. Make a follow up appointment for your next follow up appointment so I can get a fasting lipid panel. Consider making an appointment with our nutritionist.

## 2011-03-26 NOTE — Assessment & Plan Note (Signed)
Pt continues to refuse nutrition consult or PT consult for exercise routine.  Will consider giving pt handout on home chair exercises at next appointment.

## 2011-04-16 ENCOUNTER — Other Ambulatory Visit: Payer: Self-pay | Admitting: Family Medicine

## 2011-04-16 NOTE — Telephone Encounter (Signed)
Refill request

## 2011-04-17 ENCOUNTER — Encounter: Payer: Self-pay | Admitting: Family Medicine

## 2011-04-17 ENCOUNTER — Other Ambulatory Visit: Payer: Medicare Other

## 2011-04-17 ENCOUNTER — Ambulatory Visit (INDEPENDENT_AMBULATORY_CARE_PROVIDER_SITE_OTHER): Payer: Medicare Other | Admitting: Family Medicine

## 2011-04-17 DIAGNOSIS — E785 Hyperlipidemia, unspecified: Secondary | ICD-10-CM

## 2011-04-17 DIAGNOSIS — I10 Essential (primary) hypertension: Secondary | ICD-10-CM

## 2011-04-17 DIAGNOSIS — E119 Type 2 diabetes mellitus without complications: Secondary | ICD-10-CM

## 2011-04-17 LAB — LIPID PANEL
HDL: 46 mg/dL (ref >39)
LDL Cholesterol: 78 mg/dL (ref 0–99)
Total CHOL/HDL Ratio: 3 Ratio
Triglycerides: 59 mg/dL (ref <150)
VLDL: 12 mg/dL (ref 0–40)

## 2011-04-17 LAB — COMPREHENSIVE METABOLIC PANEL
AST: 14 U/L (ref 0–37)
Albumin: 3.4 g/dL — ABNORMAL LOW (ref 3.5–5.2)
Alkaline Phosphatase: 142 U/L — ABNORMAL HIGH (ref 39–117)
BUN: 25 mg/dL — ABNORMAL HIGH (ref 6–23)
Creat: 1.11 mg/dL (ref 0.40–1.50)
Glucose, Bld: 118 mg/dL — ABNORMAL HIGH (ref 70–99)
Total Bilirubin: 0.3 mg/dL (ref 0.3–1.2)

## 2011-04-17 NOTE — Patient Instructions (Signed)
Great job checking your blood glucose levels!  Keep up the great work! Please check in to getting control solution for contour machine. Keep checking your blood glucose levels like you are already doing.  But check 2 hours after your meals instead of 1 hour.   Also use only the new contour machine.

## 2011-04-17 NOTE — Progress Notes (Signed)
cmp and flp done [facility] [name]

## 2011-04-20 ENCOUNTER — Encounter: Payer: Self-pay | Admitting: Family Medicine

## 2011-04-21 NOTE — Assessment & Plan Note (Signed)
On bp recheck was 130/80.  Will continue to monitor closely.

## 2011-04-21 NOTE — Progress Notes (Signed)
  Subjective:    Patient ID: [name], male    DOB: 1941-08-09, 70 y.o.   MRN: [id number]  HPI Diabetes: Keeping log book more consistently.  Using 2 different glucometers.  Couldn't find control solution to check glucometer.  Denies any lows.  BG tends to run between 100-200.  A1C- 8.5 today.  Taking inslulin as directed.  Not doing any exercise.  Has not met with nutritionist  Obesity: Not exercises.  Has not made any diet changes.   Hyperlipidemia- Pt having labs drawn at today's appointment.  Right lower leg ulcer- pt states that it is unchanged. No redness. No drainage.  No pus.  No pain at site. pt did not want to remove pants so I could do a exam on legs.  No fever.   SH: pt using motorized wheelchair as needed for mobility.     Review of Systems As per above    Objective:   Physical Exam  Constitutional: He is oriented to person, place, and time.       Morbidly obese  Cardiovascular: Normal rate, regular rhythm and normal heart sounds.  Exam reveals no gallop and no friction rub.   No murmur heard. Pulmonary/Chest: Effort normal. No respiratory distress. He has no wheezes.  Abdominal: Soft. There is no tenderness.  Musculoskeletal:       Deferred- pt declined to remove pants for a physical exam of the legs.    Neurological: He is alert and oriented to person, place, and time.  Skin: Skin is warm and dry. No rash noted.  Psychiatric: He has a normal mood and affect. His behavior is normal. Judgment and thought content normal.          Assessment & Plan:

## 2011-04-21 NOTE — Assessment & Plan Note (Signed)
Will draw levels at today's appt.

## 2011-04-21 NOTE — Assessment & Plan Note (Signed)
a1c- 8.5- so must be having elevations in between bg checks.  Will have pt continue to monitor closely his blood glucose levels.   No changes in insulin at this time. Will wait until pt returns with more data in his log book to know better which insulin to adjust.

## 2011-04-21 NOTE — Assessment & Plan Note (Signed)
Encouraged pt to do chair exercises.  Weight not checked at last 2 appointments.  Will talk with staff at next appt to make sure we are tracking weight so that we can monitor progress.

## 2011-05-04 ENCOUNTER — Emergency Department (HOSPITAL_COMMUNITY)
Admission: EM | Admit: 2011-05-04 | Discharge: 2011-05-05 | Disposition: A | Discharge status: Home / Self Care | Payer: Medicare Other | Attending: Emergency Medicine | Admitting: Emergency Medicine

## 2011-05-04 ENCOUNTER — Emergency Department (HOSPITAL_COMMUNITY): Payer: Medicare Other

## 2011-05-04 DIAGNOSIS — R069 Unspecified abnormalities of breathing: Secondary | ICD-10-CM | POA: Insufficient documentation

## 2011-05-04 DIAGNOSIS — K219 Gastro-esophageal reflux disease without esophagitis: Secondary | ICD-10-CM | POA: Insufficient documentation

## 2011-05-04 DIAGNOSIS — J449 Chronic obstructive pulmonary disease, unspecified: Secondary | ICD-10-CM | POA: Insufficient documentation

## 2011-05-04 DIAGNOSIS — R042 Hemoptysis: Secondary | ICD-10-CM | POA: Insufficient documentation

## 2011-05-04 DIAGNOSIS — I10 Essential (primary) hypertension: Secondary | ICD-10-CM | POA: Insufficient documentation

## 2011-05-04 DIAGNOSIS — J4489 Other specified chronic obstructive pulmonary disease: Secondary | ICD-10-CM | POA: Insufficient documentation

## 2011-05-04 DIAGNOSIS — E119 Type 2 diabetes mellitus without complications: Secondary | ICD-10-CM | POA: Insufficient documentation

## 2011-05-04 DIAGNOSIS — J40 Bronchitis, not specified as acute or chronic: Secondary | ICD-10-CM | POA: Insufficient documentation

## 2011-05-04 DIAGNOSIS — Z794 Long term (current) use of insulin: Secondary | ICD-10-CM | POA: Insufficient documentation

## 2011-05-05 LAB — URINALYSIS, ROUTINE W REFLEX MICROSCOPIC
Bilirubin Urine: NEGATIVE
Nitrite: NEGATIVE
Specific Gravity, Urine: 1.024 (ref 1.005–1.030)
Urobilinogen, UA: 1 mg/dL (ref 0.0–1.0)
pH: 5.5 (ref 5.0–8.0)

## 2011-05-05 LAB — DIFFERENTIAL
Basophils Absolute: 0 10*3/uL (ref 0.0–0.1)
Eosinophils Absolute: 0.5 10*3/uL (ref 0.0–0.7)
Eosinophils Relative: 5 % (ref 0–5)
Lymphocytes Relative: 20 % (ref 12–46)
Monocytes Absolute: 0.8 10*3/uL (ref 0.1–1.0)

## 2011-05-05 LAB — BASIC METABOLIC PANEL
CO2: 26 mEq/L (ref 19–32)
GFR calc Af Amer: >60 mL/min (ref >60)
GFR calc non Af Amer: >60 mL/min (ref >60)
Glucose, Bld: 171 mg/dL — ABNORMAL HIGH (ref 70–99)
Potassium: 4.1 mEq/L (ref 3.5–5.1)
Sodium: 136 mEq/L (ref 135–145)

## 2011-05-05 LAB — CBC
HCT: 32.9 % — ABNORMAL LOW (ref 39.0–52.0)
MCHC: 31 g/dL (ref 30.0–36.0)
MCV: 80.6 fL (ref 78.0–100.0)
Platelets: 272 10*3/uL (ref 150–400)
RDW: 15.9 % — ABNORMAL HIGH (ref 11.5–15.5)

## 2011-05-05 LAB — PROTIME-INR: Prothrombin Time: 13.8 seconds (ref 11.6–15.2)

## 2011-05-05 LAB — PRO B NATRIURETIC PEPTIDE: Pro B Natriuretic peptide (BNP): 82 pg/mL (ref 0–125)

## 2011-05-06 ENCOUNTER — Encounter: Payer: Self-pay | Admitting: Home Health Services

## 2011-05-14 ENCOUNTER — Ambulatory Visit (INDEPENDENT_AMBULATORY_CARE_PROVIDER_SITE_OTHER): Payer: Medicare Other | Admitting: Family Medicine

## 2011-05-14 ENCOUNTER — Encounter: Payer: Self-pay | Admitting: Family Medicine

## 2011-05-14 DIAGNOSIS — R209 Unspecified disturbances of skin sensation: Secondary | ICD-10-CM

## 2011-05-14 DIAGNOSIS — Z7189 Other specified counseling: Secondary | ICD-10-CM

## 2011-05-14 DIAGNOSIS — R202 Paresthesia of skin: Secondary | ICD-10-CM | POA: Insufficient documentation

## 2011-05-14 DIAGNOSIS — J449 Chronic obstructive pulmonary disease, unspecified: Secondary | ICD-10-CM

## 2011-05-14 MED ORDER — FLUTICASONE PROPIONATE HFA 220 MCG/ACT IN AERO: 1.0000 | INHALATION_SPRAY | Freq: Two times a day (BID) | RESPIRATORY_TRACT | Status: DC

## 2011-05-14 NOTE — Assessment & Plan Note (Signed)
Will have pt keep log book of symptoms and f/up at next appt on this issue.

## 2011-05-14 NOTE — Op Note (Signed)
NAME:  [name], [name]                ACCOUNT NO.:  [id number]   MEDICAL RECORD NO.:  [id number]          PATIENT TYPE:  AMB   LOCATION:  [facility]                         FACILITY:  [facility]   PHYSICIAN:  Excell Seltzer. Annabell Howells, M.D.    DATE OF BIRTH:  Jul 16, 1941   DATE OF PROCEDURE:  05/12/2008  DATE OF DISCHARGE:                               OPERATIVE REPORT   PROCEDURE:  Transrectal ultrasound and biopsy.   PREOPERATIVE DIAGNOSIS:  Nodular prostate, with elevated prostate  specific antigen.   POSTOPERATIVE DIAGNOSIS:  Nodular prostate, with elevated prostate  specific antigen.   SURGEON:  Excell Seltzer. Annabell Howells, M.D.   ANESTHESIA:  Local.   COMPLICATIONS:  None.   INDICATIONS:  Mr. Mchargue is a 70 year old black male with history of  elevated PSA and nodular biopsy who for medical reasons failed to return  for a recommended prostate biopsy in the past.  He was seen back with a  PSA now at approximately 16, with a nodular prostate.  It was felt  because of his size that the biopsy should be performed at [facility] instead of our office.   FINDINGS AT PROCEDURE:  The patient was given p.o. Cipro and IV Rocephin  as well as a Fleet enema.  He was taken to the operating room, where he  was left on the holding room stretcher and placed in the lateral  decubitus position.  The ultrasound probe was assembled and inserted.  Scanning was performed at 10 MHz.  Examination revealed normal-appearing  seminal vesicles.  The prostate was 29 mL in size, with a length of 4.04  cm, a height of 2.89 cm, and a width of 4.82 cm.  There was  hypoechogenicity both in the right lateral prostate from the base to the  midportion and on the left from the base the midportion.  The apex was  relatively unremarkable.  There were some hyperechoic changes,  particular on the left in the transitional zone.  There were a few  scattered calcifications.  A normal-appearing transitional zone was not  readily  identified.  After completion of the diagnostic scan, the  patient underwent prostatic block with approximately 5 mL of 2%  lidocaine without epinephrine.  He then underwent a 12/4 pattern biopsy  without complications.  He was returned to the holding area in stable  condition.      Excell Seltzer. Annabell Howells, M.D.  Electronically Signed     JJW/MEDQ  D:  05/12/2008  T:  05/12/2008  Job:  [id number]

## 2011-05-14 NOTE — Procedures (Signed)
DUPLEX DEEP VENOUS EXAM - LOWER EXTREMITY   INDICATION:  Ulcer.   HISTORY:  Edema:  Yes.  Trauma/Surgery:  No.  Pain:  No.  PE:  No.  Previous DVT:  No.  Anticoagulants:  Other:   DUPLEX EXAM:                CFV   SFV   PopV  PTV    GSV                R  L  R  L  R  L  R   L  R  L  Thrombosis    o  o  o  o  o  o  o   o  o  o  Spontaneous   +  +  +  +  +  +  +   +  +  +  Phasic        +  +  +  +  +  +  +   +  +  +  Augmentation  +  +  +  +  +  +  +   +  +  +  Compressible  +  +  +  +  +  +  +   +  +  +  Competent     o  o  +  +  o  o  +   +  +  +   Legend:  + - yes  o - no  p - partial  D - decreased   IMPRESSION:  1. No evidence of deep or superficial venous thrombosis noted in the      bilateral lower extremities.  2. Clinically significant venous reflux noted in the bilateral common      femoral and popliteal veins.    _____________________________  Quita Skye Hart [place], M.D.   CH/MEDQ  D:  10/03/2010  T:  10/03/2010  Job:  [id number]

## 2011-05-14 NOTE — Discharge Summary (Signed)
[name], SANS                ACCOUNT NO.:  [id number]   MEDICAL RECORD NO.:  [id number]          PATIENT TYPE:  INP   LOCATION:  6729                         FACILITY:  [facility]   PHYSICIAN:  Leighton Roach McDiarmid, M.D.DATE OF BIRTH:  Dec 18, 1941   DATE OF ADMISSION:  02/07/2008  DATE OF DISCHARGE:  02/08/2008                               DISCHARGE SUMMARY   PRIMARY CARE PHYSICIAN:  [name], M.D.  At [name].   DISCHARGE DIAGNOSES:  1. Symptomatic bradycardia secondary to medication.  2. Dyspnea secondary to pulmonary congestion.  3. Hypertension.  4. Diabetes type 2.  5. Morbid obesity.  6. Severe osteoarthritis.   DISCHARGE MEDICATIONS:  1. Nexium 40 mg p.o. once daily.  2. Aspirin 81 mg p.o. once daily.  3. Xalatan 0.005% solution 1 drop in both eyes for glaucoma each      evening.  4. Flomax 0.4 mg 1 tablet p.o. b.i.d.  5. Guaifenesin 400 mg p.o. t.i.d.  6. Furosemide 20 mg p.o. once daily.  7. Darvocet 100/650 one tablet p.o. twice daily as needed for pain.  8. Lantus 55 units subcutaneous injections b.i.d.  9. Flonase 1 spray each nostril daily.  10.NovoLog 10 units subcutaneous injection 15 minutes prior to      breakfast.  11.NovoLog 15 units subcutaneous injection 15 minutes prior to lunch      and dinner.  12.Metoprolol 40 mg p.o. b.i.d.  13.HCTZ/triamterene 25/37.5 mg p.o. once daily.  14.Glipizide 10 mg p.o. once daily.   Please note that Cardizem LA 360 mg was discontinued.   ADMISSION LABS:  The patient had a white blood cell count of 11.1,  hemoglobin 10.1, platelets of 250.  Point of care cardiac markers were  negative.  EKG conveyed sinus bradycardia.  The patient had a creatinine  of 1.2.  LFTs within normal limits, albumin of 1.7.  The patient had a D-  dimer that was also negative.  Chest x-ray showed a cardiomegaly and  mild pulmonary and vascular congestion.  BNP was 71.  On the day of  discharge, cardiac enzymes were  cycled, and they were negative x1.  Sodium was 138, potassium 3.7, creatinine 0.97.  CBGs ranged from 120s  to 130s during this hospitalization.   CONSULTS:  None.   PROCEDURES:  None.   BRIEF HOSPITAL COURSE:  This is a 70 year old male that was admitted for  chest discomfort and dyspnea felt to be secondary to bradycardia and  pulmonary congestion.  1. Symptomatic bradycardia.  This 70 year old male has a known history      of being noncompliant with his medications and taking more of his      metoprolol than needed.  He has been seen in the clinic before.      During his hospitalization, it was noted that he was bradycardic      while in the ED.  His heart rate was in the 50s and 60s.  He also      complained of some left-sided chest pain.  That chest pain was      relieved with  some O2.  The patient was admitted on 24-hour      observation to rule out ischemia.  His cardiac enzymes were      negative at point of care and also after 8 hours.  The patient      underwent an EKG that showed sinus rhythm but bradycardia.  The      patient's metoprolol and Diltiazem were held during his      hospitalization, and his heart rate increased from the 50s to the      70s on discharge.  He was recontinued on his metoprolol at his home      dose, but the Diltiazem was discontinued.  He was started on      HCTZ/triamterene dose of 25/37.5 to control his blood pressure.      The patient was also ruled out for PE.  He had a negative D-dimer      and chest x-ray and showed cardiomegaly and pulmonary vascular      congestion.  The patient was in stable condition upon discharge.  2. Dyspnea.  The patient came in complaining of shortness of breath,      underwent med neb treatment which improved.  He has shortness of      breath.  He was placed on p.r.n. nebs during his hospitalization.      Echo was not performed because he had a Myoview performed in 2007      that was normal.  He did not have  significant crackles or lower      extremity edema on exam.  He stated that he had not had his Lasix      in more than three days.  His chest x-ray was consistent with some      mild pulmonary  vascular congestion which may have contributed to      his shortness of breath.  No further workup was performed during      his hospitalization as his symptoms have subsided.  3. Diabetes.  The patient was continued on his home regimen of Lantus      and NovoLog and was also placed on sliding-scale insulin and      continued on his oral medication of Glipizide.  His blood glucose      remained stable throughout his hospitalization ranging 120s to      130s.  4. Hypertension.  The patient with slightly elevated blood pressures      during his hospitalization ranging from the 150s to 160s systolic.      He did however have his metoprolol and Diltiazem held.  Medications      were switched during his hospitalization.  The Diltiazem was      discontinued as stated before, and he was placed on new medication,      the HCTZ/triamterene as stated before.  He was not placed on      Norvasc, because he has had a trial of Norvasc in the past.  I      believe it was 2006 and experienced some leg edema.  He was not      placed on ARB because he states that he had had bad effects from it      in the past complaining of weight gain.  He was not placed on an      ACE inhibitor, because he has experienced cough while on a trial of      that medication.  He was restarted back on  his Lasix on discharge.   DISCHARGE INSTRUCTIONS:  Please note that a new medication was started,  and he was to continue on his medication, HCTZ/triamterene, and his  Cardizem was stopped.  Also note to Dr. Sandria Manly that Dr. Mayford Knife has seen  this patient in the hospital and was concerned about his coronary artery  disease risk factors, just wanting to make sure that future risk  stratification was performed such as lipid panel.    Followup appointments with Dr. Sandria Manly at [facility], phone  number [phone] on February 12, 2008 at 3:30 p.m.  This patient needs to  be on a low-sodium, heart-healthy diet with no restrictions in activity.  He is discharged in stable condition.      Marisue Ivan, MD  Electronically Signed      Leighton Roach McDiarmid, M.D.  Electronically Signed    KL/MEDQ  D:  02/08/2008  T:  02/09/2008  Job:  [id number]   cc:   [name], M.D.

## 2011-05-14 NOTE — Progress Notes (Signed)
  Subjective:    Patient ID: [name], male    DOB: 07/31/1941, 70 y.o.   MRN: [id number]  HPI Seen on 5/6 in ER: Coughing up blood, dark, thick.  This started on 5/6.  ER evaluated pt and told him told him that it may be bronchitis, not pneumonia.  CXR was done and EKG and urine was tested.  Took prednisone 40mg  x 5 days and avelox x 6 days.  Pt states that he feels much better but still has some stuffiness in throat.  States that he has had tightness in throat x 1 month.  Describes as a type of congestion.  Sometimes has wheezing in throat.  Off and on.  Happens usually at night.  Albuterol inhaler seems to help it.    Sensation of something crawling on right shoulder; But nothing is on skin.  Off and on. Happens daily. Last for a few moments. Worried that he may have some type of neuropathy.  Stasis ulcers: Pt states that they are almost healed.  No redness. No fever. No drainage.  On small one on back of left calf.  On on right shin.      SH:  Pt has been using motorized wheelchair to get around usually- but sometimes uses cane.   Review of Systems     Objective:   Physical Exam  Constitutional: He is oriented to person, place, and time.       obese  Cardiovascular: Normal rate and regular rhythm.   No murmur heard. Pulmonary/Chest: Effort normal and breath sounds normal. No respiratory distress. He has no wheezes.  Neurological: He is alert and oriented to person, place, and time.  Skin:       Healed ulcer pencil eraser in size back of left calf.  Chronic pencil eraser size Shallow ulcer present right shin. Antibiotic cream on wound.  No dischage visualized.  No surrounding erythema.    Chronic cyst present on shins bilateral.  Right shoulder skin: No rash present, no redness. No painful areas.          Assessment & Plan:

## 2011-05-14 NOTE — Patient Instructions (Signed)
Use steroid inhaler 2 x day as directed- return in 1 month to see if improved.   Keep track of what things seem to bring on the feeling on the right shoulder: what positions, when you are wearing a shirt or no shirt, what things seem to be connected.    Return in 1 month

## 2011-05-14 NOTE — Assessment & Plan Note (Addendum)
Will f/up on pt's chronic issues at next appt:  DM (pt didn't bring log book), hyperlipidemia, obesity, htn. Also need to review needed medical health maintenance information/recommendations at future appt.

## 2011-05-14 NOTE — Assessment & Plan Note (Signed)
CXR from ER visit on 5/6 showed COPD changes- pt had bronchitis during that visit.  Since pt using albuterol and having sensation of tightness and wheezing every night will start pt on flovent.  Pt to return in 1 month to see if improvement.  Pt to have PFT's performed by Dr. Raymondo Band.  I will request this appt.

## 2011-05-15 ENCOUNTER — Other Ambulatory Visit: Payer: Self-pay | Admitting: Family Medicine

## 2011-05-15 NOTE — Telephone Encounter (Signed)
Refill request

## 2011-05-17 NOTE — H&P (Signed)
[place]. [facility]  Patient:    [name], [name] Visit Number: [id number] MRN: [id number]          Service Type: SUR Location: RCRM 2550 04 Attending Physician:  Nadara Mustard Dictated by:   Nadara Mustard, M.D. Admit Date:  04/23/2002                           History and Physical  HISTORY OF PRESENT ILLNESS:  The patient is a 70 year old gentleman who is about six months status post a nondisplaced stress fracture of his left proximal tibia.  The patient healed this and then subsequently six months after initial injury developed a Charcot collapse of the fracture site.  The patient presents at this time for internal fixation.  ALLERGIES:  No known drug allergies.  MEDICATIONS: 1. Cardura 8 mg p.o. q.h.s. 2. Glucovance 2.5/500 with 2 p.o. q.a.m. and 1 p.o. q.p.m. 3. Lasix 20 mg p.o. q.d. 4. Nexium 40 mg q.d. 5. Vioxx 25 mg q.d. 6. Prevacid 30 mg b.i.d. 7. Vicodin p.r.n. for pain.  FAMILY HISTORY:  Positive for diabetes and hypertension.  SOCIAL HISTORY:  Negative for tobacco and negative for alcohol.  REVIEW OF SYSTEMS:  Positive for diabetes, peptic ulcer disease, and prostate enlargement.  PHYSICAL EXAMINATION:  VITAL SIGNS:  Temperature 98.5, pulse 84, respiratory rate 20, blood pressure 152/90.  GENERAL:  In general, he is a morbidly obese gentleman in no acute distress.  LUNGS:  Clear to auscultation.  CARDIOVASCULAR:  Regular rate and rhythm with a 3/6 systolic ejection murmur.  NECK:  Supple.  No bruits.  EXTREMITIES:  He has a varus collapse of his left proximal tibia.  No warmth, no redness, no drainage, and no open wounds.  LABORATORY DATA:  Radiographs of the Charcot collapse.  ASSESSMENT:  Charcot collapse left proximal tibia fracture.  PLAN:  He is scheduled for internal fixation at this time with takedown of t he malunion/nonunion with lateral plating.  The risks and benefits were discussed including infection and  neurovascular injury, failure of fixation, failure of the wound to heal.  The patient states he understands and wishes to proceed at this time. Dictated by:   Nadara Mustard, M.D. Attending Physician:  Nadara Mustard DD:  04/23/02 TD:  04/23/02 Job: [id number]

## 2011-05-17 NOTE — Discharge Summary (Signed)
[place]. [facility]  Patient:    [name], [name] Visit Number: [id number] MRN: [id number]          Service Type: SUR Location: 5000 5036 01 Attending Physician:  Nadara Mustard Dictated by:   Nadara Mustard, M.D. Admit Date:  04/23/2002 Discharge Date: 04/28/2002                             Discharge Summary  DIAGNOSIS:  Charcot fracture left proximal tibia.  PROCEDURE:  Open reduction and internal fixation left proximal tibia.  DISPOSITION:  Discharged to home in stable condition.  FOLLOW-UP:  The patient will follow up in the office in two weeks.  HISTORY OF PRESENT ILLNESS:  The patient is a 71 year old gentleman who is six months status post a nondisplaced stress fracture of his left proximal tibia. The patient healed this nicely but then subsequently six months after his initial injury he started developing a Charcot collapse of the fracture site with varus angulation.  He presents at this time for internal fixation.  HOSPITAL COURSE:  The patients hospital course was essentially unremarkable. He was admitted on April 23, 2002, and underwent open reduction and internal fixation of the left proximal tibia with correction of the varus deformity. He was started on Kefzol for infection prophylaxis and was placed in a knee immobilizer.  The patient progressed well with physical therapy and was discharged to home in stable condition on April 28, 2002, with follow-up in the office in two weeks. Dictated by:   Nadara Mustard, M.D. Attending Physician:  Nadara Mustard DD:  05/17/02 TD:  05/19/02 Job: [id number]
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
## 2011-05-17 NOTE — Discharge Summary (Signed)
NAME:  [name], [name]                          ACCOUNT NO.:  [id number]   MEDICAL RECORD NO.:  [id number]                   PATIENT TYPE:  INP   LOCATION:  1607                                 FACILITY:  [facility]   PHYSICIAN:  Nadara Mustard, M.D.                DATE OF BIRTH:  Aug 22, 1941   DATE OF ADMISSION:  11/23/2003  DATE OF DISCHARGE:  11/28/2003                                 DISCHARGE SUMMARY   DIAGNOSIS:  Osteoarthritis, right hip.   PROCEDURES:  Right total hip arthroplasty.   DISPOSITION:  Discharged to rehabilitation in stable condition.  Plan to  follow up in two weeks after discharge.   HISTORY OF PRESENT ILLNESS:  The patient is a 70 year old gentleman with  progressive osteoarthritis of the right hip.  The patient has progressed to  the point where he is only able to ambulate with crutches due to right hip  pain and presents at this time for a right total hip arthroplasty.   HOSPITAL COURSE:  The patient's hospital course was essentially  unremarkable.  He underwent a right total hip arthroplasty on November 23, 2003, with a #2 Accolade femur, 56 mm acetabulum, 10 degree poly liner, +0  neck with a 36 mm head.  The patient received Kefzol for infection  prophylaxis and Coumadin for DVT prophylaxis.  Postoperative radiographs  showed stable alignment.  The patient states that he felt much better after  surgery.  His vital signs remained stable.  He was slow with physical  therapy and was discharged to rehabilitation in stable condition on  postoperative day #5 on November 28, 2003, with follow up in the office two  weeks after discharge from the hospital.                                                Nadara Mustard, M.D.    MVD/MEDQ  D:  12/23/2003  T:  12/25/2003  Job:  [id number]

## 2011-05-17 NOTE — Discharge Summary (Signed)
[place]. [facility]  Patient:    [name], [name]                       MRN: [id number] Adm. Date:  [id number] Disc. Date: 09/09/00 Attending:  Nadara Mustard Dictator:   Gwenlyn Perking, M.D. CC:         Rockey Situ. Flavia Shipper., M.D.  Nadara Mustard, M.D.   Discharge Summary  ADMISSION DIAGNOSES 1. Osteomyelitis, right tibia. 2. Diabetes mellitus, type 2. 3. Microalbuminuria. 4. Benign prostatic hyperplasia. 5. Morbid obesity. 6. Constipation.  DISCHARGE DIAGNOSES 1. Osteomyelitis, right tibia, status post incision and drainage and    corticotomy. 2. Diabetes mellitus, type 2, well-controlled. 3. Microalbuminuria. 4. Benign prostatic hyperplasia, well-controlled. 5. Morbid obesity. 6. Constipation, resolved.  SERVICE:  Orthopedic surgery, attending -- Dr. Nadara Mustard, resident -- Dr. Gwenlyn Perking.  PRIMARY CARE PHYSICIAN:  Dr. Gwenlyn Perking.  CONSULTANTS 1. Infectious disease, Dr. Rockey Situ. Flavia Shipper 2. Family Practice Teaching Service.  PROCEDURE:  September 03, 2000, I&D of soft tissue with a right tibial corticotomy.  DISCHARGE MEDICATIONS 1. Cipro 750 mg one p.o. b.i.d. 2. Cardura 4 mg one p.o. q.h.s. 3. Protonix 40 mg one p.o. b.i.d. 4. Glucovance 2.5/500 mg two p.o. q.a.m., one p.o. q.p.m. 5. Vicodin 5/500 mg one p.o. q.4h. p.r.n. pain.  HOSPITAL COURSE:  The patient was admitted on September 03, 2000 on recommendations of orthopedic surgery.  The patient had a chronic draining right tibial skin ulcer which was suspicious for osteomyelitis.  Patient was admitted and on September 03, 2000, taken to the operating room for an I&D and corticotomy of is right tibia; patient tolerated this procedure well.  The patient was started on IV antibiotics and infectious disease, Dr. Burnice Logan, was consulted to follow along for recommendations regarding the antibiotic.  The patient was first started on Ancef, which was then  changed to Cipro on recommendations of the infectious disease doctor.  His chronic medical conditions warranted a consult from [facility]. His chronic medical conditions include benign prostatic hyperplasia, morbid obesity and type 2 diabetes mellitus, which were all under good condition. For risk stratification, a hemoglobin A1c was obtained which was 7.5.  Patient was also checked for microalbuminuria, which was positive at 0.3.  He had been treated for that in the past with Lotensin; however, due to episodes of severe hypotension, did not tolerate treatment of this condition.  The patient continued to do well in the postop period and on recommendations of the primary doctor, Dr. Lajoyce Corners, orthopedic surgery, it was decided that patient had benefited maximally from this hospital admission on September 09, 2000 and could be transferred to rehab.  DISCHARGE CONDITION:  Stable.  DISPOSITION:  Discharge patient to rehab.  DISCHARGE INSTRUCTIONS:  Activity per rehab doctor.  DIET:  An 1800-kilocalorie ADA diet, low salt, low fat.  ALLERGIES:  No known drug allergies.  SYMPTOMS TO WARRANT FURTHER TREATMENT:  Should the patient notice an increase in drainage or should he notice any fevers, he is to contact his primary care physician, Dr. Peter Minium.  FOLLOWUP:  The patient is to follow up with Dr. Elbert Ewings at the [facility] on September 24, 2000 at 2 p.m.; he is also to follow up with orthopedic surgery based on their recommendations. DD:  09/09/00 TD:  09/09/00 Job: [id number]

## 2011-05-17 NOTE — Discharge Summary (Signed)
   NAME:  [name], [name]                          ACCOUNT NO.:  [id number]   MEDICAL RECORD NO.:  [id number]                   PATIENT TYPE:  INP   LOCATION:  5019                                 FACILITY:  [facility]   PHYSICIAN:  Nadara Mustard, M.D.                DATE OF BIRTH:  08-09-1941   DATE OF ADMISSION:  02/09/2003  DATE OF DISCHARGE:  02/15/2003                                 DISCHARGE SUMMARY   DIAGNOSIS:  Charcot nonunion left tibial plateau fracture.   PROCEDURES:  1. Removal of retained hardware.  2. Osteotomy of the tibia and fibula.  3. ORIF of the tibia with C-arm utilized.   HOSPITAL COURSE:  This was essentially unremarkable.  He was treated with  Kefzol for infection prophylaxis and Coumadin for DVT prophylaxis.  He was  started on physical therapy with progressive ambulation, nonweightbearing on  the left.  The patient had difficulty with physical therapy and was unable  to ambulate without significant amount of assistance.  Rehab was consulted  and the patient was felt to be a good candidate for the subacute care unit.  The patient was discharged to the subacute care unit on February 15, 2003,  with follow-up in the office two weeks after discharge from the hospital.                                               Nadara Mustard, M.D.    MVD/MEDQ  D:  03/24/2003  T:  03/25/2003  Job:  [id number]

## 2011-05-17 NOTE — Op Note (Signed)
NAME:  [name], [name]                ACCOUNT NO.:  [id number]   MEDICAL RECORD NO.:  [id number]          PATIENT TYPE:  AMB   LOCATION:  ENDO                         FACILITY:  [facility]   PHYSICIAN:  John C. Madilyn Fireman, M.D.    DATE OF BIRTH:  10-20-1941   DATE OF PROCEDURE:  03/05/2005  DATE OF DISCHARGE:                                 OPERATIVE REPORT   PROCEDURE:  Colonoscopy.   INDICATIONS FOR PROCEDURE:  History of adenomatous colon polyps.   PROCEDURE:  The patient was placed in the left lateral decubitus position  and placed on the pulse monitor with continuous low flow oxygen delivered by  nasal cannula.  He was sedated with 12.5 mcg IV fentanyl and 1 mg IV Versed  in addition to the medicine given for the previous EGD.  The Olympus video  colonoscope is inserted into the rectum and advanced to the cecum, confirmed  by transillumination at McBurney's point and visualization of the ileocecal  valve and appendiceal orifice.  The prep was excellent.  The cecum,  ascending, transverse, descending, and sigmoid colon all appeared normal  with no masses, polyps, diverticula, or other mucosal abnormalities.  The  rectum likewise appeared normal, and retroflexed view of the anus revealed  some small internal hemorrhoids.  The scope was then withdrawn, and the  patient returned to the recovery room in stable condition.  He tolerated the  procedure well, and there were no immediate complications.   IMPRESSION:  Internal hemorrhoids, otherwise normal study.   PLAN:  Will proceed with repeat colonoscopy in five years.      JCH/MEDQ  D:  03/05/2005  T:  03/05/2005  Job:  [id number]   cc:   Penni Bombard, MD  Fax: [phone]

## 2011-05-17 NOTE — Op Note (Signed)
[place]. [facility]  Patient:    [name], [name] Visit Number: [id number] MRN: [id number]          Service Type: SUR Location: 5000 5036 01 Attending Physician:  Nadara Mustard Dictated by:   Nadara Mustard, M.D. Proc. Date: 04/23/02 Admit Date:  04/23/2002                             Operative Report  PREOPERATIVE DIAGNOSIS:  Charcot collapsed left proximal tibia.  POSTOPERATIVE DIAGNOSIS:  Charcot collapsed left proximal tibia.  PROCEDURE:  Open reduction internal fixation, left proximal tibia with a locking Synthes tibial plate.  SURGEON:  Nadara Mustard, M.D.  ANESTHESIA:  General endotracheal.  ESTIMATED BLOOD LOSS:  Minimal.  ANTIBIOTICS:  Kefzol 1 g.  TOURNIQUET TIME:  Ninety minutes at 350 mmHg, knee immobilizer.  DISPOSITION:  PACU in stable condition.  INDICATION FOR PROCEDURE:  The patient is a 70 year old gentleman, six months status post a stress fracture of left proximal tibia.  He has developed a Charcot collapse with a varus angulation, with a malunion/nonunion.  He presents at this time for surgical intervention.  The risks and benefits were discussed.  The patient states he understands and wishes to proceed at this time.  DESCRIPTION OF PROCEDURE:  The patient was brought to OR room #5 and underwent a general anesthetic.  After an adequate level of anesthesia was obtained, he was placed on the flat Jackson table and his left lower extremity was prepped using DuraPrep, draped in a sterile field, and Ioban was used to cover all exposed skin.  The leg was elevated and the tourniquet inflated to 350 mmHg. A lateral incision was made at the knee.  This was essentially over his old previous incisions for a femur fracture, which underwent internal fixation. We did not use the hockey stick due to the continuation of his previous surgical incision.  This was carried sharply down and the anterior compartment was released and taken  laterally.  The fracture site was identified.  The callous was taken down around the fracture site, and a lateral wedge of bone was taken.  The fracture was reduced.  It placed the patient in a slight amount of valgus.  The locking plate was then positioned and was locked with three screws transversely across the proximal aspect of the tibia.  Two angled screws also were locked entering into the proximal fragment, and four screws were placed transversely across the distal fragment with four points of fixation distally, and essentially 8 cortices distally.  A stab incision was made distally for the most distal locking screw.  The wounds were irrigated with normal saline.  C-arm fluoroscopy verified reduction.  The deep muscle fascial layer was closed using 2-0 Vicryl.  The subcu was closed using 2-0 Vicryl.  The skin was closed using approximated staples.  The wounds were covered with Adaptic orthopedic sponges, sterile Webril, Ace wrap from toes to thigh, and the knee immobilizer was applied.  The patient was extubated and taken to PACU in stable condition. Dictated by:   Nadara Mustard, M.D. Attending Physician:  Nadara Mustard DD:  04/23/02 TD:  04/24/02 Job: [id number]

## 2011-05-17 NOTE — Discharge Summary (Signed)
[place]. [facility]  Patient:    [name], [name]                       MRN: [id number] Adm. Date:  [id number] Disc. Date: [id number] Attending:  Aldean Baker V                           Discharge Summary  DIAGNOSIS:  Osteomyelitis right tibia status post irrigation and debridement and status post placement of antibiotic beads.  PROCEDURE:  Irrigation and debridement of skin, soft tissue, and bone harvesting of antibiotic beads and placement of a VAC wound closure device.  DISPOSITION:  Discharged to home in stable condition.  PLAN:  Follow up in the office in one to two weeks.  HISTORY OF PRESENT ILLNESS:  Patient is a 70 year old gentleman status post a chronic right tibia injury with an open wound and chronic osteomyelitis.  He initially underwent irrigation and debridement, placement of antibiotic beads and wound care and now presents for wound closure and removal of antibiotic beads.  HOSPITAL COURSE/BRIEF OPERATIVE NOTE October 01, 2000:  Diagnosis of osteomyelitis right tibia status post I&D and placement of antibiotic beads. Procedure was irrigation and debridement of skin, soft tissue, bone, placement of VAC and harvesting antibiotic beads.  Disposition to PACU in stable condition.  He was started on Kefzol 1 g IV q.8h.  His VAC was working well. The canister was changed on October 02, 2000.  His vital signs were stable. His CBG was stable.  He was discharged to home in stable condition on October 05, 2000.  He will change the VAC wound at home Monday, Wednesday, Friday.  Plan to follow up in the office in one to two weeks.  We will discharge on p.o. antibiotics. DD:  11/25/00 TD:  11/25/00 Job: [id number]
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
## 2011-05-17 NOTE — Op Note (Signed)
[place]  Patient:    [name], [name]                       MRN: [id number] Proc. Date: 10/01/00 Adm. Date:  [id number] Attending:  Aldean Baker V                           Operative Report  PREOPERATIVE DIAGNOSIS:  Osteomyelitis, right tibia, status post irrigation and debridement and placement of antibiotic beads.  POSTOPERATIVE DIAGNOSIS:  Osteomyelitis, right tibia, status post irrigation and debridement and placement of antibiotic beads.  OPERATION PERFORMED:  Irrigation and debridement of skin, soft tissue and bone.  Removal of antibiotic beads and placement of VAC.  SURGEON:  Nadara Mustard, M.D.  ANESTHESIA:  General endotracheal.  ESTIMATED BLOOD LOSS:  Minimal.  ANTIBIOTICS:  1 gm of Kefzol.  DISPOSITION:  To PACU in stable condition.  INDICATIONS FOR PROCEDURE:  The patient is a 70 year old gentleman with chronic osteomyelitis of the right tibia.  He was status post debridement and antibiotic bead placement who presents at this time for definitive wound treatment.  The risks and benefits of surgery were discussed including infection, neurovascular injury, fracture of the bone, need for additional surgery, need for potential soft tissue flap, anticipated need for cultured Apligraf.  The patient states that he understands and wishes to proceed at this time.  DESCRIPTION OF PROCEDURE:  The patient was brought to the operating room #6 and underwent general endotracheal anesthetic.  After adequate level of anesthesia was obtained, the patients right lower extremity was prepped using Betadine paint, Betadine scrub and draped into a sterile field with impervious stockinet covering the entire right lower extremity.  The stockinet was cut and opened over the area of the traumatic wound.  The antibiotic beads were removed.  The necrotic and nonviable tissue was removed.  There was good granulation tissue.  Very small amount of fibrous  tissue but no evidence of any necrosis.  The wound was irrigated with pulse lavage ____________ the bone was further coracotomized and there was good petechial bleeding in the bone.  The wound was again irrigated with a pulse lavage and a VAC sponge was placed deep in the wound.  This was turned on to 125 mm intermittent with good suction.  The patient was extubated and taken to PACU in stable condition. Plan to discharge when we can set up home health [facility] treatment. DD:  10/01/00 TD:  10/02/00 Job: [id number]
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
## 2011-05-17 NOTE — Discharge Summary (Signed)
NAME:  [name], [name]                          ACCOUNT NO.:  [id number]   MEDICAL RECORD NO.:  [id number]                   PATIENT TYPE:  IPS   LOCATION:  4147                                 FACILITY:  [facility]   PHYSICIAN:  [name], M.D.           DATE OF BIRTH:  Nov 25, 1941   DATE OF ADMISSION:  11/28/2003  DATE OF DISCHARGE:  12/05/2003                                 DISCHARGE SUMMARY   DISCHARGE DIAGNOSES:  1. Right total hip arthroplasty secondary to osteoarthritis, November 23, 2003.  2. Pain management.  3. Coumadin for deep venous thrombosis prophylaxis.  4. Anemia.  5. Non-insulin-dependent diabetes mellitus.  6. Hypertension.  7. Benign prostatic hypertrophy.  8. History of left tibial plateau fracture.  9. Morbid obesity.   HISTORY OF PRESENT ILLNESS:  This is a 70 year old black male admitted  November 23, 2003 with advanced osteoarthritis of the left hip and no relief  with conservative care.  He underwent a right total hip arthroplasty on  November 23, 2003 per Dr. Lajoyce Huff.  Placed on Coumadin for deep venous  thrombosis prophylaxis; weight bearing as tolerated.  Postoperative anemia  of 9.2.  No chest pain, no shortness of breath, minimal assistance for  ambulation.  Blood sugars 152 to 154 with noted history of non-insulin-  dependent diabetes mellitus.  INR of 1.3.  He was admitted for a  comprehensive rehabilitation program.   PAST MEDICAL HISTORY:  See discharge diagnoses.   ALLERGIES:  None.   SOCIAL HISTORY:  He denies alcohol or tobacco.   PRIMARY CARE PHYSICIAN:  Dr. Viviann Spare A. Waynette Huff at the outpatient clinic at  [facility].   MEDICATIONS PRIOR TO ADMISSION:  1. Norvasc 10 mg daily.  2. Hyzaar daily.  3. Lasix 20 mg daily.  4. Atacand daily.  5. Nexium 40 mg daily.  6. Glyburide 2.5 mg daily.   SOCIAL HISTORY:  He lives alone in [place], [place].  Independent  with crutches prior to admission.  One level home with a  ramp.  No local family.   HOSPITAL COURSE:  The patient did well while on rehabilitation services with  therapies initiated on a b.i.d. basis.  The following issues were followed  during patient's rehab course.  Pertaining to [name] right total hip  arthroplasty, the surgical site is healing nicely, staples have been  removed, no signs of infection.  Neurovascular sensation remained intact.  He was weight bearing as tolerated with total hip precautions and will  follow up with Dr. Lajoyce Huff.  Home Health therapies have been arranged.  He continued on Coumadin for deep  venous thrombosis prophylaxis.  On the day of discharge his INR was 2.7.  A  Home Health nurse has been arranged from Coumadin protocol.  Pain management  ongoing with the use of Oxycodone as needed and good results.  Postoperative  anemia is stable at hemoglobin  8.4, hematocrit 25.3.  His hemoglobin  remained stable x2.  He had no shortness of breath, no increased lethargy,  no chest pain, no dizziness associated from his anemia. Blood sugars had  some mild elevated variables.  His Glyburide was increased to 5 mg daily.  Advised to continue checking his blood sugars twice daily and follow up with  his primary care physician.  Blood pressures were controlled with home  regimens of Norvasc, Hyzaar, Lasix and Atacand.  He was voiding without  difficulty with noted history of benign prostatic hypertrophy.  Overall for  his functional ability he was supervision for mobility, supervision to  minimal assist for activities of daily living, needing some assistance for  lower body dressing.  Home Health therapies have been arranged.  Full family  teaching completed.   LATEST LABS:  Showed an INR of 2.7.  Hemoglobin 8.4, hematocrit 25.3.  Sodium 136, potassium 4.0, BUN 31, creatinine 1.4.   DISCHARGE MEDICATIONS:  1. Coumadin 5 mg daily until December 23, 2003 and stop.  2. Norvasc 10 mg daily.  3. Lasix 20 mg daily.  4. Nexium  daily.  5. Atacand daily.  6. Hyzaar daily.  7. Trinsicon one capsule b.i.d.  8. Glyburide 5 mg daily.  9. Oxycodone p.r.n. pain.   ACTIVITY:  Weight bearing as tolerated with hip precautions.   DIET:  Diabetic diet.   WOUND CARE:  Cleanse incision daily with soap and water.   SPECIAL INSTRUCTIONS:  Home Health physical and occupational therapy.  The  patient should follow up with Dr. Lajoyce Huff of orthopedic services, call for  appointment.      [name], P.A.                     [name], M.D.    DA/MEDQ  D:  12/05/2003  T:  12/06/2003  Job:  [id number]   cc:   [name], M.D.  [address] [place]  [place] [address]  Fax: [phone]   [facility]

## 2011-05-17 NOTE — Procedures (Signed)
[place]. [facility]  Patient:    [name], [name]                       MRN: [id number] Proc. Date: 06/02/00 Adm. Date:  [id number] Attending:  Louie Bun CC:         Gwenlyn Perking, M.D.                           Procedure Report  PROCEDURES:  Colonoscopy with polypectomy.  INDICATIONS:  Polyps seen on flexible sigmoidoscopy.  DESCRIPTION OF PROCEDURE:  The patient was placed in the left lateral decubitus position and placed on the pulse monitor with continuous low-flow oxygen delivered by nasal cannula.  He was sedated with 75 mg of IV Demerol and 6 mg of IV Versed.  The Olympus video colonoscope was inserted into the rectum and advanced to the cecum, confirmed by transillumination of McBurneys point and visualization of the ileocecal valve and appendiceal orifice.  The prep was moderate with some semisolid stool and adherent nontransparent liquid stool, limiting visualization in some areas.  I could not thus rule out small lesions less than 1 cm in all areas.  Otherwise, the cecum, ascending, and transverse colon appeared normal with no masses, polyps, diverticula, or other mucosal abnormalities.  Within the descending colon was seen an 8 mm sessile polyp which was fulgurated by hot biopsy.  In the sigmoid colon at 50 cm was a 1 cm pedunculated polyp that was removed by snare.  At 25 cm, a similar pedunculated polyp was removed by snare as well.  The remainder of the sigmoid and rectum appeared normal down to the anus where no obvious internal hemorrhoids were seen.  The colonoscope was then withdrawn and the patient returned to the recovery room in stable condition.  He tolerated the procedure well and there were no immediate complications.  IMPRESSION:  Three colon polyps removed by a combination of snare and a hot biopsy.  PLAN:  Await histology for determination of interval for next colonoscopy. DD:  06/02/00 TD:  06/04/00 Job:  [id number]
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
## 2011-05-17 NOTE — Op Note (Signed)
NAME:  [name], [name] NO.:  [id number]   MEDICAL RECORD NO.:  [id number]          PATIENT TYPE:  INP   LOCATION:  5015                         FACILITY:  [facility]   PHYSICIAN:  Nadara Mustard, MD     DATE OF BIRTH:  1941/07/24   DATE OF PROCEDURE:  03/19/2006  DATE OF DISCHARGE:                                 OPERATIVE REPORT   PREOP DIAGNOSIS:  Osteoarthritis, left hip.   POSTOP DIAGNOSIS:  Osteoarthritis, left hip.   PROCEDURE:  Left total hip arthroplasty with DePuy components, metal-on-  metal, with a 60-mm acetabulum, 53 mm metal head #10 Corail stem and a +2  neck.   SURGEON:  Nadara Mustard, MD   ANESTHESIA:  General.   ESTIMATED BLOOD LOSS:  Please see anesthesia notes.   ANTIBIOTICS:  1 gram of Kefzol preoperatively and 1 gram of Kefzol  intraoperatively.   DRAINS:  None.   COMPLICATIONS:  None.   DISPOSITION:  To PACU in stable condition.   INDICATIONS FOR PROCEDURE:  The patient is a 70 year old gentleman morbidly  obese who is status post a right total hip arthroplasty. The patient has a  history of venostasis swelling as well as osteomyelitis of his tibia on the  right. The patient has had progressive pain is unable to ambulate for  activities of daily living and wished to proceed with total hip arthroplasty  on the left. Risks and benefits were discussed including infection, and the  higher risk of infection due to his history of osteomyelitis, neurovascular  injury, injury to the sciatic nerve, DVT, pulmonary embolus, nonhealing of  the wound, dislocation of the hip, need for additional surgery. The patient  states he understands and wished proceed at this time.   DESCRIPTION OF PROCEDURE:  The patient was brought to OR room 5 and  underwent a general anesthetic. After adequate level of anesthesia obtained,  the patient was placed in the right lateral decubitus position with a left  side up and his left lower extremity was prepped  using DuraPrep and draped  in a sterile field. [name] was used to cover all exposed skin. A large  posterolateral incision was made.  This was carried down to the tensor  fascia lata. This was split.  A Charnley retractor was placed as well as  multiple other deep retractors to be able to visualize the surgical field.  The piriformis was tagged, cut and retracted. The short external rotators  were tagged, cut and retracted. The capsule was teed, tagged, cut and  retracted. The hip was unable to be dislocated due to the complete adhesions  of the capsule and the fact the patient had no motion of the hip  preoperatively. The hip neck was cut in situ 1 cm proximal to calcar, cut at  the angle of the prosthesis. The head was then delivered from the field. The  labral tissue was excised and osteophytes were excised with osteotome. The  acetabulum was then sequentially reamed to a 59 for a 60-mm acetabulum. The  acetabulum component was fixed and this was secured  with approximately 45  degrees of abduction and approximately 20 degrees flexion. This had good  posterior wall coverage and superior coverage. Attention was then focused on  the femur. The femoral component was not completely adhesed previously.  The  gluteus maximus insertion had been released due to complete adhesions. The  anterior capsule and the iliopsoas released. This allowed for motion of the  hip.  The hip was then sequentially broached to a size 10. The size 10  broach was placed and the calcar was planed to the edge of the broach. This  was then tried and the permanent size 10 was then chosen. This was placed  the +2 neck, with the 53 mm metal head was inserted. This was reduced. The  patient had a negative. He had good coverage.  There was no instability with  range of motion, flexion of 90 degrees was stable. The wound was then  irrigated with normal saline. The wound was irrigated with normal saline  throughout the case to  minimize ischemia to the fat tissue. The tensor  fascia was closed using running #1 Vicryl. The deep and subcu fascial layers  were closed with 1-0 and 0-0 Vicryl. Skin was closed approximating staples.  The wound was covered Adaptic orthopedic sponges, ABD dressing and Hypafix  tape. The patient was extubated and taken to PACU in stable condition.      Nadara Mustard, MD  Electronically Signed     MVD/MEDQ  D:  03/19/2006  T:  03/20/2006  Job:  [id number]
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
## 2011-05-17 NOTE — H&P (Signed)
NAME:  [name], [name]                          ACCOUNT NO.:  [id number]   MEDICAL RECORD NO.:  [id number]                   PATIENT TYPE:  INP   LOCATION:  NA                                   FACILITY:  [facility]   PHYSICIAN:  Nadara Mustard, M.D.                DATE OF BIRTH:  01-12-1941   DATE OF ADMISSION:  DATE OF DISCHARGE:                                HISTORY & PHYSICAL   HISTORY OF PRESENT ILLNESS:  The patient is a 70 year old gentleman with  severe osteoarthritis of the right hip. The patient has failed conservative  care and currently ambulates using two crutches due to pain of hip and  presents at this time for right total hip arthroplasty.   ALLERGIES:  No known drug allergies.   MEDICATIONS:  1. Norvasc 10 mg q.d.  2. Hyzaar 100/25 mg q.d.  3. Lasix 20 mg q.d.  4. Atacand 16/12.5 mg q.d.  5. Nexium 40 mg q.d.  6. Glyburide 2.5 mg a day.   PAST MEDICAL HISTORY:  1. Significant for ORIF right ankle and wrist in 1975, revision right wrist     in 1998.  2. Osteotomy right tibia in 2001 with nonunion tibial fracture with surgery     in February 2004.  3. ORIF of the left tibial plateau in April 2003 with I&D of right tibia in     2001.   SOCIAL HISTORY:  Negative for tobacco, negative for alcohol use, married.   FAMILY HISTORY:  Positive for diabetes, hypertension.   PRIMARY CARE PHYSICIAN:  [facility].   REVIEW OF SYSTEMS:  Positive for type 2 diabetes.   PHYSICAL EXAMINATION:  VITAL SIGNS:  Temperature 97, pulse 84, respiratory  rate 24, blood pressure 172/86.  GENERAL:  He is in no acute distress.  LUNGS:  Clear to auscultation.  CARDIOVASCULAR:  Regular rate and rhythm. He does have expiratory wheezes.  Heart does have a 3/6 systolic ejection murmur.  EXTREMITIES:  Examination of the right hip:  He has good dorsalis pedis  pulse. He has internal rotation and external rotation to 0 degrees. He lacks  20 degrees to full extension.   RADIOLOGIC FINDINGS:  Radiograph shows osteoarthritis of the right hip. The  patient is morbidly obese.   ASSESSMENT:  Osteoarthritis, right hip.   PLAN:  The patient is scheduled for right total hip arthroplasty at this  time. The risks and benefits were discussed including infection,  neurovascular injury, persistent pain, dislocation of the hip, DVT, and  pulmonary embolus. The patient states he understands and wishes to proceed  at this time.                                                Nadara Mustard,  M.D.    MVD/MEDQ  D:  11/23/2003  T:  11/23/2003  Job:  [id number]

## 2011-05-17 NOTE — Op Note (Signed)
NAME:  [name], [name]                          ACCOUNT NO.:  [id number]   MEDICAL RECORD NO.:  [id number]                   PATIENT TYPE:  INP   LOCATION:  2550                                 FACILITY:  [facility]   PHYSICIAN:  Nadara Mustard, M.D.                DATE OF BIRTH:  April 02, 1941   DATE OF PROCEDURE:  02/09/2003  DATE OF DISCHARGE:                                 OPERATIVE REPORT   PREOPERATIVE DIAGNOSIS:  Charcot nonunion collapse, left proximal tibial  plateau fracture, with failure of internal fixation.   PROCEDURE:  1. Removal of retained hardware.  2. Osteotomy to correct the varus alignment of the tibia and fibula.  3. Open reduction and internal fixation of the tibia with an eight-hole     locking plate.  4. C-arm fluoroscopy was used for guidance of hardware removal as well as     hardware insertion.   SURGEON:  Nadara Mustard, M.D.   ANESTHESIA:  General.   ESTIMATED BLOOD LOSS:  300 mL.   ANTIBIOTICS:  Kefzol 1 g.   TOURNIQUET TIME:  None.   DISPOSITION:  To PACU in stable condition.   INDICATION FOR PROCEDURE:  The patient is a 70 year old gentleman with type  2 diabetes, with a previous Charcot collapse of the left proximal tibia, who  underwent internal fixation and presents a year and a half later with  failure of the internal fixation.  The risks and benefits were discussed;  the patient states he understands and wishes to proceed at this time.   DESCRIPTION OF PROCEDURE:  The patient was brought to OR 5 and underwent a  general anesthetic.  After adequate level of anesthesia was obtained, the  patient was placed supine on the flat Jackson table and his left lower  extremity was prepped using Duraprep and draped into a sterile field and all  exposed skin was covered with Ioban dressing.  The previous lateral incision  was used, which was carried sharply down to the hardware.  The proximal and  distal hardware were removed with the guidance of the  C-arm fluoroscopy.  After removal of hardware at the nonunion of the fracture site, a wedge  osteotomy was created in the tibia and an oblique osteotomy was created in  the fibula and the leg alignment was corrected from varus to neutral  alignment.  Temporary fixation was then used to stabilize the locking tibial  plateau plate.  Three locking screws were placed across the fibular plateau  and another oblique screw was also placed into the proximal fragment.  A  distal cortical as well as three locking screws were used distally.  C-arm  fluoroscopy verified reduction.  The wound was irrigated with normal saline.  The deep fascia was closed using 2-0 Vicryl and the skin was closed using  approximated staples.  The wound was covered with  Adaptic, orthopedic sponges, Webril  and a Coban dressing from his toes to  his thigh.  The patient was extubated and taken to PACU in stable condition.  Plan for IV antibiotics, PT for progressive ambulation, knee immobilizer on  the left, nonweightbearing for four weeks.                                               Nadara Mustard, M.D.    MVD/MEDQ  D:  02/09/2003  T:  02/09/2003  Job:  [id number]

## 2011-05-17 NOTE — Op Note (Signed)
NAME:  [name], [name]                ACCOUNT NO.:  [id number]   MEDICAL RECORD NO.:  [id number]          PATIENT TYPE:  AMB   LOCATION:  ENDO                         FACILITY:  [facility]   PHYSICIAN:  John C. Madilyn Fireman, M.D.    DATE OF BIRTH:  January 04, 1941   DATE OF PROCEDURE:  03/05/2005  DATE OF DISCHARGE:                                 OPERATIVE REPORT   PROCEDURE:  Esophagogastroduodenoscopy with polypectomy.   INDICATIONS FOR PROCEDURE:  Solid and some liquid dysphagia.   PROCEDURE:  The patient was placed in the left lateral decubitus position  and placed on the pulse monitor with continuous low flow oxygen, delivered  by nasal cannula.  He was sedated with 50 mcg IV fentanyl and 6 mg IV  Versed.  The Olympus video endoscope was advanced under direct vision into  the oropharynx and esophagus.  The esophagus was straight and of normal  caliber with the squamocolumnar line at 38 cm.  There was a circumferential  ring or stricture with a triangular-shaped 1 cm ulceration just at and above  the stricture.  There was one other linear erosion with a small amount of  exudate.  This was consistent with grade 3 erosive esophagitis.  There was  no resistance to the scope beyond the strictured area and into the stomach.  The stomach was entered, and a small amount of liquid secretions were  suctioned from the fundus.  Retroflexed view of the cardia was unremarkable.  The fundus appeared normal.  In the proximal body along the greater  curvature, there was a 1 cm sessile polyp that was removed by snare.  The  remainder of the body, antrum, and pylorus all appeared normal.  The  duodenum was entered.  Both bulb and second portions were well inspected and  appeared within normal limits.  The scope was then withdrawn, and the  patient returned to the recovery room in stable condition.  He tolerated the  procedure well, and there were no immediate complications.   IMPRESSION:  1.  Gastric polyp.  2.   Inflamed lower esophageal stricture.   PLAN:  1.  We will treat with a proton pump inhibitor and if the dysphagia does not      improve, will need repeat dilatation.  2.  Await histology on the polyp.      JCH/MEDQ  D:  03/05/2005  T:  03/05/2005  Job:  [id number]   cc:   Penni Bombard, MD  Fax: [phone]
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
## 2011-05-17 NOTE — Op Note (Signed)
NAME:  Joel, Huff                          ACCOUNT NO.:  [id number]   MEDICAL RECORD NO.:  [id number]                   PATIENT TYPE:  INP   LOCATION:  NA                                   FACILITY:  [facility]   PHYSICIAN:  Nadara Mustard, M.D.                DATE OF BIRTH:  Jul 30, 1941   DATE OF PROCEDURE:  DATE OF DISCHARGE:                                 OPERATIVE REPORT   PREOPERATIVE DIAGNOSIS:  Osteoarthritis, right hip.   POSTOPERATIVE DIAGNOSIS:  Osteoarthritis, right hip.   PROCEDURE:  1. Right total hip arthroplasty with press fit.  2. Femoral component Accolade 56 mm acetabulum, 10 degree poly liner, +0     neck, 36 mm head.   SURGEON:  Nadara Mustard, M.D.   ANESTHESIA:  General.   ESTIMATED BLOOD LOSS:  250 mL.   ANTIBIOTICS:  1 gram Kefzol.   DISPOSITION:  PACU in stable condition.   INDICATIONS FOR PROCEDURE:  The patient is a 70 year old gentleman, morbidly  obese, who has had progressive arthritis in his right hip.  The patient has  gotten to the point where he can barely ambulate even with using two  crutches and presents at this time for total hip arthroplasty.  The risks  and benefits were discussed including infection, neurovascular injury,  persistent pain, leg weight inequality, dislocation, DVT, and pulmonary  embolus.  The patient states he understands and wishes to proceed at this  time.   DESCRIPTION OF PROCEDURE:  The patient was brought to OR Room 4 and  underwent a general anesthetic.  After an adequate level of anesthesia was  obtained, the patient was placed in the left lateral decubitus position with  the right side up, and his right lower extremity was prepped using duraprep  and draped in the sterile field.  [name] was used to cover all exposed skin.  A posterior incision was made.  This was carried down through the tensor  fascia lata which was split.  A Charnley retractor was placed.  The  piriformis and short external rotators were  typed, cut, and retracted.  The  sciatic nerve was protected throughout the case.  The capsule was tied, cut,  tagged, and retracted.  The femoral neck cut was then made 1 cm proximal to  the calcar.  The femoral head was delivered and attention was first focused  on the acetabulum.  The acetabulum was sequentially reamed to a 56 mm.  This  had a good press fit and the 56 mm final implant was chosen. The 10 degree  poly liner was then placed.  Attention was then focused on the femur. The  femur was sequentially broached starting with 0 up to a #2 Accolade Broach.  This was then reduced with a +0 neck.  The patient had flexion of 110  degrees, full adduction and internal rotation of 75 degrees.  Preoperatively, the  patient had 0 degrees of internal and external rotation.  He had full __________ in external rotation and the hip was stable.  The  trial components were then removed.  The central acetabular spacer was  placed, the 10 degree poly liner was placed, and the #2 femoral Accolade  component was inserted.  The wound was irrigated throughout the case.  The  +0 36 mm head was then inserted and the hip was reduced.  The head was again  placed through a full range of motion and hip was stable.  The wound was  again irrigated.  The capsule and short external rotators were  reapproximated using #1 Ethibond.  The tensor fascia lata was closed using  running #1 Vicryl.  Subcutaneous was closed using #0 Vicryl.  The skin was  closed using approximated staples.  The wound was covered with Adaptic  orthopedic sponges, ABD dressing, and Hypafix tape.  The patient was  extubated.  Knee immobilizer was applied.  He was taken to the PACU in the  stable condition.                                               Nadara Mustard, M.D.    MVD/MEDQ  D:  11/23/2003  T:  11/23/2003  Job:  [id number]

## 2011-05-17 NOTE — Discharge Summary (Signed)
NAMEZELIG, GACEK NO.:  [id number]   MEDICAL RECORD NO.:  [id number]          PATIENT TYPE:  INP   LOCATION:  5015                         FACILITY:  [facility]   PHYSICIAN:  Nadara Mustard, MD     DATE OF BIRTH:  1941-02-05   DATE OF ADMISSION:  03/19/2006  DATE OF DISCHARGE:  03/26/2006                                 DISCHARGE SUMMARY   DIAGNOSIS:  Osteoarthritis left hip.   PROCEDURE:  Left total hip arthroplasty.  Discharged to home in stable  condition with home health physical therapy and home health nursing to  follow up in the office in 2 weeks.   HISTORY OF PRESENT ILLNESS:  The patient is a 70 year old gentleman who is  status post right total hip arthroplasty for osteoarthritis.  The patient  has had progressive pain and inability to perform activities of daily living  due to left hip arthritis and presents at this time for a left total hip  arthroplasty.  The patient underwent a left total hip arthroplasty on March 19, 2006, with DePuy components, metal-on-metal bearing surface, with a 60  mm acetabulum 53 mm head, with #10 stem and a +2 neck.  The patient received  Kefzol for infection prophylaxis and was started on Coumadin for DVT  prophylaxis.  The patient was started with physical therapy for progressive  ambulation, weightbearing as tolerated.  Postoperative day #1 the patient  did have decreased blood pressure.  He received fluid bolus.  Radiograph  showed stable alignment.  He was neurovascularly intact and his hemoglobin  was 9.9.  The patient's hemoglobin dropped to 7.0 on March 23 and he was  typed and crossed and received 2 units of packed red blood cells.  The  patient had a decreasing renal function most likely due to fluid, with a BUN  of 50 and creatinine 3.9, and the patient was evaluated by the medical team  for evaluation of renal function.  He also had a low albumin of 1.9 and  nutrition was consulted.  An FL-2 was signed with the  possibility of needing  short-term skilled nursing.  The patient has continued to progress slowly.  He had chest pain with standing on March 24 and workup was negative for  cardiac disease.  The patient continued to progress slowly with therapy.  However, on postoperative day #6 he had significant improvement with his  therapy and it was felt that the patient would be able to be discharged to  home and not require skilled nursing care.  The patient was discharged to  home on March 23 with followup in the office in 2 weeks.  He was kept on  Coumadin for DVT prophylaxis and was set up for home health therapy and home  health nursing.      Nadara Mustard, MD  Electronically Signed     MVD/MEDQ  D:  05/06/2006  T:  05/07/2006  Job:  [id number]

## 2011-05-17 NOTE — Discharge Summary (Signed)
[place]. [facility]  Patient:    [name], [name]                         MRN: [id number] Adm. Date:  07/11/00 Disc. Date: 07/12/00 Attending:  Asencion Partridge, M.D. Dictator:   Delano Metz, M.D.                           Discharge Summary  DATE OF BIRTH:  1941/05/03  ADDITIONAL ATTENDING:  Gwenlyn Perking, M.D.  DISCHARGE DIAGNOSES: 1. Presyncope - probably due to orthostatic hypertension. 2. Venostasis ulcer. 3. Diabetes mellitus, type 2. 4. Benign prostatic hypertrophy. 5. Osteoarthritis. 6. Anemia.  HISTORY OF PRESENT ILLNESS:  [name] is a 70 year old African-American male who had an episode of dizziness yesterday.  He states that prior to this he was weak, had diaphoresis and felt like his heart was pounding.  He never loss consciousness.  He never had any focal neurologic changes.  He denies any chest pain or shortness of breath.  HOSPITAL COURSE:  The patient was admitted for presyncope to be ruled out for MI via cardiac enzymes.  His initial EKG showed some P-wave inversion in the lateral leads.  His repeat EKG the next morning showed no such changes.  The patient telemetry showed no arrhythmias overnight.  At discharge, the plan was to decrease the patients Cardura to 4 mg one each day, continue his Lasix 20 mg one each day.  At this point, will hold his Lotensin, consider gradually adding back the Lotensin, and gradually weaning the Cardura to a lower dose.  OTHER ISSUES:  The patient will continue taking Augmentin 875 mg b.i.d. for ten days for possible leg infection.  I believe this is most likely venostasis ulcer.  However, he will continue with his same wound dressing.  The patient will continue with his Glucovance.  His sugars have been well controlled in the hospital.  The patients stool was guaiac positive.  He recently had a colonoscopy in which several polyps were removed.  He will continue with Prevacid.   In addition, will continue with his iron sulfate.  DIET:  A 2200-calorie ADA diet.  WOUND CARE:  Unna boot and moisturizing lotion.  SPECIAL INSTRUCTIONS:  The patient is to return to the emergency department if chest pain develops.  The patient will follow up with Dr. Elbert Huff at [facility] on July 17, 2000, for his regularly scheduled appointment. May consider some sort of a stress test at that time.  The patient has had an EPS study approximately 15 months ago.  He had an echo at that time as well that showed a normal ejection fraction.  Holter monitor showed no arrhythmias. DD:  07/12/00 TD:  07/12/00 Job: [id number]
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
## 2011-05-28 ENCOUNTER — Encounter: Payer: Self-pay | Admitting: Family Medicine

## 2011-06-19 ENCOUNTER — Ambulatory Visit: Payer: Medicare Other | Admitting: Family Medicine

## 2011-07-15 ENCOUNTER — Other Ambulatory Visit: Payer: Self-pay | Admitting: Family Medicine

## 2011-07-15 NOTE — Telephone Encounter (Signed)
Refill request

## 2011-09-20 LAB — DIFFERENTIAL
Basophils Absolute: 0
Basophils Relative: 1
Eosinophils Absolute: 0.3
Eosinophils Absolute: 0.5
Eosinophils Relative: 3
Eosinophils Relative: 5
Lymphocytes Relative: 14
Monocytes Absolute: 0.7
Monocytes Absolute: 0.8
Monocytes Relative: 9
Neutrophils Relative %: 67

## 2011-09-20 LAB — COMPREHENSIVE METABOLIC PANEL
ALT: 16
AST: 15
Albumin: 2.7 — ABNORMAL LOW
Alkaline Phosphatase: 122 — ABNORMAL HIGH
CO2: 27
Chloride: 105
GFR calc Af Amer: >60
GFR calc non Af Amer: >60
Potassium: 4.4
Sodium: 138
Total Bilirubin: 0.2 — ABNORMAL LOW

## 2011-09-20 LAB — TSH: TSH: 1.954

## 2011-09-20 LAB — CBC
MCV: 81
MCV: 81.7
Platelets: 233
Platelets: 250
RBC: 3.79 — ABNORMAL LOW
RDW: 15.1
WBC: 11.1 — ABNORMAL HIGH
WBC: 9.5

## 2011-09-20 LAB — BASIC METABOLIC PANEL
BUN: 18
Chloride: 107
Creatinine, Ser: 0.97

## 2011-09-20 LAB — B-NATRIURETIC PEPTIDE (CONVERTED LAB): Pro B Natriuretic peptide (BNP): 71

## 2011-09-20 LAB — POCT CARDIAC MARKERS: Myoglobin, poc: 206

## 2011-09-20 LAB — CK TOTAL AND CKMB (NOT AT ARMC): Total CK: 211

## 2011-09-25 LAB — POCT I-STAT 4, (NA,K, GLUC, HGB,HCT)
Glucose, Bld: 100 — ABNORMAL HIGH
HCT: 38 — ABNORMAL LOW
Hemoglobin: 12.9 — ABNORMAL LOW

## 2011-11-08 ENCOUNTER — Other Ambulatory Visit: Payer: Self-pay | Admitting: Family Medicine

## 2011-11-08 NOTE — Telephone Encounter (Signed)
Refill request

## 2011-11-11 NOTE — Telephone Encounter (Signed)
Request refill 

## 2011-11-13 ENCOUNTER — Telehealth: Payer: Self-pay | Admitting: Family Medicine

## 2011-11-13 NOTE — Telephone Encounter (Signed)
Message to administrative staff:  Please call pt and let him know that I would like to see him for a follow up appointment.  I did go ahead and send in refills as requested.

## 2011-11-14 ENCOUNTER — Ambulatory Visit (INDEPENDENT_AMBULATORY_CARE_PROVIDER_SITE_OTHER): Payer: Medicare Other | Admitting: Family Medicine

## 2011-11-14 ENCOUNTER — Ambulatory Visit: Payer: Medicare Other | Admitting: Family Medicine

## 2011-11-14 VITALS — BP 160/84 | HR 73 | Temp 97.4°F | Ht 72.0 in | Wt 389.0 lb

## 2011-11-14 DIAGNOSIS — Z719 Counseling, unspecified: Secondary | ICD-10-CM

## 2011-11-14 DIAGNOSIS — M79609 Pain in unspecified limb: Secondary | ICD-10-CM

## 2011-11-14 DIAGNOSIS — Z23 Encounter for immunization: Secondary | ICD-10-CM

## 2011-11-14 DIAGNOSIS — I10 Essential (primary) hypertension: Secondary | ICD-10-CM

## 2011-11-14 DIAGNOSIS — M86669 Other chronic osteomyelitis, unspecified tibia and fibula: Secondary | ICD-10-CM

## 2011-11-14 DIAGNOSIS — G8929 Other chronic pain: Secondary | ICD-10-CM

## 2011-11-14 DIAGNOSIS — M79606 Pain in leg, unspecified: Secondary | ICD-10-CM

## 2011-11-14 DIAGNOSIS — Z7189 Other specified counseling: Secondary | ICD-10-CM

## 2011-11-17 DIAGNOSIS — Z719 Counseling, unspecified: Secondary | ICD-10-CM | POA: Insufficient documentation

## 2011-11-17 DIAGNOSIS — G8929 Other chronic pain: Secondary | ICD-10-CM | POA: Insufficient documentation

## 2011-11-17 MED ORDER — INSULIN GLARGINE 100 UNIT/ML ~~LOC~~ SOLN: 70.0000 [IU] | Freq: Two times a day (BID) | SUBCUTANEOUS | Status: DC

## 2011-11-17 NOTE — Assessment & Plan Note (Signed)
Blood pressure elevated at 160/84 and today's appointment. Patient missing 3 doses per week. Encouraged patient to take medication as directed and to return for a pressure recheck in 1-2 months.

## 2011-11-17 NOTE — Assessment & Plan Note (Signed)
A1c goal of 7-7.5 and a patient age 70 years old. Patient currently has A1c of 8.2. Tracking blood glucose at home-130s to 170s. Did not bring blood glucose log book to appointment. Dental care-at next appointment need to ensure up-to-date Eyecare-at next appointment need to ensure up-to-date with eye exam Foot exam-due for foot exam need to do it next appointment Renal-patient has allergy to ace-i and arb's, therefore cannot use these agents. Consider obtaining urine microalbumin at next appointment.

## 2011-11-17 NOTE — Assessment & Plan Note (Signed)
Pt does not come frequently to doctor's office due to difficulty getting in and out of office with motorized wheelchair, and using [facility] bus to get to and from doctor.  Usually office visits are rushed so that he doesn't miss the [facility] but that will take him home.  Asked pt to please return in 1-2 months since he has so many medical problems that we are not really addressing.  Pt states he will try his best.  At future appt- need to f/up on items that we addressed today-  Also need to discuss recommended health screenings, weight management, hyperlipidemia.

## 2011-11-17 NOTE — Assessment & Plan Note (Signed)
Patient requesting possible home health services to help with cleaning and cooking-states it is hard for him to do this since he is having lots of leg pain from chronic osteo-myelitis in left leg. Also patient is requesting walker with seat. We'll order home health eval for services. We'll also send prescription for a walker with seat to patients desired healthcare supply company. I feel like a walker with a seat would benefit this patient, in that he would use his motorized wheelchair less. It would  empowered him to do tasks at home, and we'll give him the security of knowing that he can take frequent rest as needed. Even minimal physical activity will benefit this patient.

## 2011-11-17 NOTE — Assessment & Plan Note (Signed)
Chronic osteomyelitis of right lower leg-no signs of acute infection- did note slight enlargement in diameter of ulceration at today's visit, no surrounding erythema, no fever, no systemic signs of infection. We'll continue to follow. If patient decides he would like further treatment will refer back to Dr. Lajoyce Corners.

## 2011-11-17 NOTE — Progress Notes (Signed)
  Subjective:    Patient ID: [name], male    DOB: [birth year], 70 y.o.   MRN: [id number]  HPI Needs home health services: Patient states that he has chronic leg pain that causes him to be unable to clean for very long, also has to sit frequently while trying to prepare meals. Patient would like to check into possible home health services to help with cleaning and cooking since he perceives that his function is impaired to the task due to leg pain.  Patient states that he would also like a walker with a seat to help him be able to ambulate in home and take frequent rests in the setting of his leg pain.  Blood pressure followup: Patient blood pressure 160/84 today. States he has been missing 2-3 doses per week at his blood pressure medication. Patient is not exercising.  Diabetes: A1c today 8.2. States he checks blood sugar at home. Did not bring log book. Blood sugars range from 130s to 170s. States he is taking Lantus 70 units twice a day. And 20 units of NovoLog with lunch and supper.  Right lower leg: Patient states that his right lower leg wound continues to be there. Dr. Lajoyce Corners told him that most likely it would not heal up completely. He was told to take the small wound covered and to allow it to drain. He was told that if the wound worsened acutely he was to seek medical attention. Patient states he is not worried about this wound. No fever. No pain at the site.  Review of Systems As per above.    Objective:   Physical Exam  Constitutional: He is oriented to person, place, and time. He appears well-developed and well-nourished.       Sitting in motorized wheelchair.  HENT:  Head: Normocephalic and atraumatic.  Cardiovascular: Normal rate, regular rhythm and normal heart sounds.   No murmur heard. Pulmonary/Chest: Effort normal. No respiratory distress. He has no wheezes. He has no rales.  Musculoskeletal:       Right lower leg: Chronic cystic nodules present along shin.  Small, draining ulcer mid shin, medial, right lower leg. Dressing in place. No surrounding erythema. No warmth to palpation. Small amount of cream colored discharge.  Neurological: He is alert and oriented to person, place, and time.  Skin: No rash noted.  Psychiatric: He has a normal mood and affect. His behavior is normal.          Assessment & Plan:

## 2011-11-26 ENCOUNTER — Encounter: Payer: Self-pay | Admitting: *Deleted

## 2011-11-26 NOTE — Telephone Encounter (Signed)
Called pt. Pt said, that he would like to have a walker with seat, it would help him around the house. He has a hard time standing. Will fwd. To Dr.Caviness for order. Lorenda Hatchet, Renato Battles

## 2011-11-26 NOTE — Telephone Encounter (Signed)
Message copied by Arlyss Repress on Tue Nov 26, 2011 11:48 AM ------      Message from: Theresia Bough      Created: Tue Nov 26, 2011  9:27 AM      Regarding: Home health services and possible equipment       Hi Tareek Sabo,            I received a referral from Dr. Edmonia James re: the pt needing home health services. Can you arrange these services for the pt.? I also believe he is requesting equipment (walker he can sit on...)

## 2011-11-27 NOTE — Telephone Encounter (Signed)
Spoke with pt regarding personal care services and

## 2011-11-29 NOTE — Telephone Encounter (Signed)
Spoke to pt on 11/27/11-- pt states that he has spoken to [employer]. He states that they're going to provide personal care services as well as walker with seat. He does not think that he will need a doctor's order for these things, but he will forward the forms to me if anything is needed.

## 2012-02-20 ENCOUNTER — Telehealth: Payer: Self-pay | Admitting: Family Medicine

## 2012-02-20 NOTE — Telephone Encounter (Signed)
Needs to know if the order for repairs has been received and signed yet.  The order was faxed on 2/8.

## 2012-02-24 ENCOUNTER — Other Ambulatory Visit: Payer: Self-pay | Admitting: Family Medicine

## 2012-02-24 NOTE — Telephone Encounter (Signed)
Called and spoke with Candice. They have sent a letter (with 3 carbon copies) to Attn: Dr.Breen. They need it mailed back because of pt's Medicare Ins. I told Candice, that I will check with both physicians. Fwd. To Dr.Breen and Dr.Caviness. Where is the letter? Lorenda Hatchet, Renato Battles

## 2012-02-24 NOTE — Telephone Encounter (Signed)
Refill request

## 2012-02-25 ENCOUNTER — Ambulatory Visit (INDEPENDENT_AMBULATORY_CARE_PROVIDER_SITE_OTHER): Payer: Medicare Other | Admitting: Family Medicine

## 2012-02-25 DIAGNOSIS — R350 Frequency of micturition: Secondary | ICD-10-CM

## 2012-02-25 DIAGNOSIS — C61 Malignant neoplasm of prostate: Secondary | ICD-10-CM

## 2012-02-25 DIAGNOSIS — M86669 Other chronic osteomyelitis, unspecified tibia and fibula: Secondary | ICD-10-CM

## 2012-02-25 DIAGNOSIS — R358 Other polyuria: Secondary | ICD-10-CM

## 2012-02-25 DIAGNOSIS — S81009A Unspecified open wound, unspecified knee, initial encounter: Secondary | ICD-10-CM

## 2012-02-25 DIAGNOSIS — S81801A Unspecified open wound, right lower leg, initial encounter: Secondary | ICD-10-CM

## 2012-02-25 LAB — POCT URINALYSIS DIPSTICK
Bilirubin, UA: NEGATIVE
Glucose, UA: NEGATIVE
Leukocytes, UA: NEGATIVE
Nitrite, UA: NEGATIVE

## 2012-02-25 LAB — POCT GLYCOSYLATED HEMOGLOBIN (HGB A1C): Hemoglobin A1C: 9.1

## 2012-02-25 MED ORDER — DOXYCYCLINE HYCLATE 100 MG PO TABS: 100.0000 mg | ORAL_TABLET | Freq: Two times a day (BID) | ORAL | Status: DC
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
## 2012-02-25 NOTE — Patient Instructions (Signed)
Leg wound: We will call you with your appointment with the wound center. Take doxycycline 100mg  by mouth 2 x per day.  Look out for signs of systemic infection-- fever, worsening of wound,  Body aches/chills, dizziness.   Urine symptoms: The u/a was negative.  We will send it for culture. I will call or mail results.   Follow up in 1 week for recheck of urine symptoms, leg wound, and diabetes.

## 2012-02-25 NOTE — Assessment & Plan Note (Addendum)
Reviewed with pt that as per Dr. Audrie Lia last consult note in 2011 that there is chronic osteomylties in this leg.  Pt refused referral back to Dr. Lajoyce Corners at this time.   States he still wound not agree to amputation.  Pt doesn't appear septic or unstable at this time. Wound is currently draining small amount of serous fluid.  Start patient on doxycycline 100 mg po bid for 10 days.  Referred to wound center- will call patient with appointment date and time. Pt to return for wound recheck in 1 week.

## 2012-02-26 ENCOUNTER — Ambulatory Visit: Payer: Medicare Other | Admitting: Family Medicine

## 2012-02-27 NOTE — Telephone Encounter (Signed)
Fwd. To PCP to sign .Arlyss Repress

## 2012-02-28 NOTE — Telephone Encounter (Signed)
Order signed and faxed back on feb 25th.

## 2012-03-01 NOTE — Assessment & Plan Note (Signed)
Worsening of A1C from 8.2 to 9.1.  Pt states he is taking medication as directed.   Did not bring glucometer readings with him to appointment.  Pt to return in 1 week for f/up on diabetes.  Pt to bring booklet and machine. Will need to titrate up on insulin.

## 2012-03-01 NOTE — Assessment & Plan Note (Signed)
Pt reports a few days of urinating every 30-45 minutes.  No other urinary symptoms.  Pt refused GU and prostate exam.  Pt also refused screenings for STD's stating that he was low risk due to no sexual activity in years.   UA was negative; U culture ordered.  Differential includes UTI, BPH, prostatitis, poor control of diabetes.  Will follow-up in 1 week to reassess symptoms.

## 2012-03-03 ENCOUNTER — Telehealth: Payer: Self-pay | Admitting: Family Medicine

## 2012-03-03 NOTE — Telephone Encounter (Signed)
Do you know about this envelope? Did you mail it? Thank you. Lorenda Hatchet, Renato Battles

## 2012-03-03 NOTE — Telephone Encounter (Signed)
Received the physicians order, but needs the CMN original back in the mail.  There was a return envelope with the packet.

## 2012-03-03 NOTE — Telephone Encounter (Signed)
Completed and mailed

## 2012-03-06 ENCOUNTER — Ambulatory Visit (INDEPENDENT_AMBULATORY_CARE_PROVIDER_SITE_OTHER): Payer: Medicare Other | Admitting: Family Medicine

## 2012-03-06 ENCOUNTER — Encounter: Payer: Self-pay | Admitting: Family Medicine

## 2012-03-06 DIAGNOSIS — K5900 Constipation, unspecified: Secondary | ICD-10-CM

## 2012-03-06 DIAGNOSIS — M86669 Other chronic osteomyelitis, unspecified tibia and fibula: Secondary | ICD-10-CM

## 2012-03-06 DIAGNOSIS — R350 Frequency of micturition: Secondary | ICD-10-CM

## 2012-03-06 NOTE — Patient Instructions (Addendum)
Right leg infection: It is important that you go to wound care center for follow up on right leg wound.  I am not sure why you have not been called with this appointment.  I will have my nurse research this and give you a call with your appointment.  Diabetes: Check blood glucose first thing in the morning before eating, then 2 hours after one of the meals of the day. Write these down and bring to your next appointment.  Continue to take your insulin as directed.   Constipation: miralax as needed.   Return in 2 weeks.

## 2012-03-06 NOTE — Assessment & Plan Note (Signed)
Pt completed course of doxycycline for wound but area is still draining serous fluid.  No signs of systemic infection today.  Pt has an appointment at the wound clinic next week for further assessment.  Will adjust insulin regimen at next visit to promote better wound healing.

## 2012-03-06 NOTE — Progress Notes (Signed)
  Subjective:    Patient ID: [name], male    DOB: [birth year], 71 y.o.   MRN: [id number]  HPI  Right leg wound: [name] is a 71 yo M with PMHx of type 2 diabetes and htn who presents for follow-up of chronic leg wound.  At his last visit he was started on doxycycline.  Pt reports continual small amounts of clear discharge from the site.  Reports that he is treating it with dressings impregnated with honey (he says he received this from a wound clinic).  Pt reports that he has not been to the wound clinic recently although we referred him at his last visit.  Denies fever, increased swelling, pain, numbness, or bleeding.  Constipation: Pt reports that he experienced one episode of constipation about 6 days ago.  He reports that it took him 30 minutes to have a BM and his stool had blood on the outside of it.  He reports that he was in a lot of pain while straining.  He reports that since then his BMs have been normal and he has not seen anymore blood.  Urinary symptoms- resolved: At patient's last visit, he reported polyuria and incomplete emptying.  He says that these symptoms have resolved.  Denies hematuria, discharge, dysuria, polyuria.  Reports nocturia x5 times each night but reports that he prefers to take his diuretics in the evenings.  Diabetes: Pt reports that blood sugars usually range from 150-200 but he did not bring in his log today.  Denies nausea, HA, dizziness, weakness, fatigue, CP, palpitations.  Review of Systems  All other systems reviewed and are negative.       Objective:   Physical Exam  Constitutional: He appears well-developed. No distress.  Neck: Normal range of motion. Neck supple.  Cardiovascular: Normal rate, regular rhythm and normal heart sounds.  Exam reveals no gallop and no friction rub.   No murmur heard. Pulmonary/Chest: Effort normal and breath sounds normal. No respiratory distress. He has no wheezes. He has no rales. He exhibits no  tenderness.  Musculoskeletal:       Right leg wound: wound has increased in size since last visit; now consists of two adjacent 1 cm circular ulcerations, minimal serous drainage, surrounded by raised border    Blood pressure 128/70, pulse 73, height 6' (1.829 m), weight 424 lb (192.325 kg).      Assessment & Plan:

## 2012-03-06 NOTE — Assessment & Plan Note (Signed)
Pt reports that symptoms have resolve.  Pt will inform us if polyuria or dysuria return.

## 2012-03-06 NOTE — Assessment & Plan Note (Signed)
HbA1c is currently 9.1.  Patient reports that home readings are usually between 150 and 200.  Pt agrees to bring his log of measurements to next appointment so that we can adjust his insulin.

## 2012-03-09 ENCOUNTER — Encounter (HOSPITAL_BASED_OUTPATIENT_CLINIC_OR_DEPARTMENT_OTHER): Payer: Medicare Other | Attending: Internal Medicine

## 2012-03-09 DIAGNOSIS — M79609 Pain in unspecified limb: Secondary | ICD-10-CM | POA: Insufficient documentation

## 2012-03-09 DIAGNOSIS — K5900 Constipation, unspecified: Secondary | ICD-10-CM | POA: Insufficient documentation

## 2012-03-09 NOTE — Assessment & Plan Note (Signed)
Pt to use miralax as needed.  Pt is to return if any further problems with constipation or any blood in stool.  Will monitor for now.

## 2012-03-19 ENCOUNTER — Other Ambulatory Visit: Payer: Self-pay | Admitting: Family Medicine

## 2012-03-19 ENCOUNTER — Telehealth: Payer: Self-pay | Admitting: Family Medicine

## 2012-03-19 MED ORDER — FLUTICASONE PROPIONATE 0.05 % EX CREA: TOPICAL_CREAM | Freq: Two times a day (BID) | CUTANEOUS | Status: DC

## 2012-03-19 NOTE — Telephone Encounter (Signed)
Can you please call pt and let him know that I sent in an RX for fluticasone propionate to his pharmacy.  I discontinued the rx for cutivate.  Hopefully this will save him money on his copay.  Please let him know that I apologize for the delay in changing this order.  And to please let me know if there are any further problems with this prescription.  Thanks, [employer]

## 2012-03-20 ENCOUNTER — Ambulatory Visit (INDEPENDENT_AMBULATORY_CARE_PROVIDER_SITE_OTHER): Payer: Medicare Other | Admitting: Family Medicine

## 2012-03-20 ENCOUNTER — Encounter: Payer: Self-pay | Admitting: Family Medicine

## 2012-03-20 DIAGNOSIS — M86669 Other chronic osteomyelitis, unspecified tibia and fibula: Secondary | ICD-10-CM

## 2012-03-20 DIAGNOSIS — E119 Type 2 diabetes mellitus without complications: Secondary | ICD-10-CM

## 2012-03-20 LAB — GLUCOSE, CAPILLARY: Glucose-Capillary: 177 mg/dL — ABNORMAL HIGH (ref 70–99)

## 2012-03-20 NOTE — Patient Instructions (Addendum)
Diabetes: Lets check your meter against mine to make sure it is accurate.  Check blood glucose level at least 2 x per week 2 hours after lunch meal.   Wound on right leg: Keep a close eye on the leg.  Let me know if any increase drainage or fever.    Return in 3 weeks to see me.

## 2012-03-20 NOTE — Progress Notes (Signed)
  Subjective:    Patient ID: [name], male    DOB: 05-18-1941, 71 y.o.   MRN: [id number]  HPI Right leg plan: Patient has been seen by the wound Center. Since this is a chronic wound, they have evaluated and decided the patient can be discharged. Patient states that drainage is very minimal at this point. No fever. No chills. No signs of systemic infection. Does keep a little piece of falls in the area to help with any drainage.  Diabetes: Patient's blood glucose home record shows that blood sugars range from 80s to 150s in the morning time. In the evening sometimes it runs from mid 150s to low 200s. Patient does not check after lunch. No early morning blood glucose checks. Patient states he is taking his insulin as directed.  Weight management: Patient states that he feels he is healthy eater but that he doesn't seem to ever lose weight. Patient states he does not want to see a nutritionist he doesn't feel that this will help. Unable to give any other details as to why he is resistant to nutritionist. States he enjoys to eat vegetables. He states he  tries to control his portion sizes. Sometimes he only small snacks but he still has problems with gaining weight.    Review of Systems As per above.    Objective:   Physical Exam  Constitutional: He appears well-developed. No distress.  Neck: Normal range of motion. Neck supple.  Cardiovascular: Normal rate, regular rhythm and normal heart sounds.  Exam reveals no gallop and no friction rub.   No murmur heard. Pulmonary/Chest: Effort normal and breath sounds normal. No respiratory distress. He has no wheezes. He has no rales. He exhibits no tenderness.  Musculoskeletal:       Right leg wound: wound is size of pencil eraser (back to baseline), minimal yellow drainage.  No surrounding erythema.  No tenderness at site.   + cystic lesions size of pingpong balls on shins bilateral- this is a chronic finding.   Neurological: He is alert.    Psychiatric: He has a normal mood and affect.          Assessment & Plan:

## 2012-03-21 NOTE — Assessment & Plan Note (Signed)
Discussed plate method today. Discussed the importance of 3 meals per day.   Pt has been resistant to nutrition counseling in past- states it won't help and he eats healthy but still gains weight.  In the future, need to explore more the barriers that keep him from attending [facility].

## 2012-03-21 NOTE — Assessment & Plan Note (Signed)
Discharged from wound center- this is a chronic issue.  No s/s of systemic infection.  Reviewed the red flags for return with patient.  Pt states understanding.

## 2012-03-21 NOTE — Assessment & Plan Note (Signed)
His home monitor seems to be working well- appears accurate compared to our monitor here.  Pt to continue to monitor Blood glucose levels and write down in logbook.- reviewed the best way to use the logbook.  Also asked pt to check pp at lunchtime to see if any elevations are occuring after lunch causing his a1c to be higher.  Pt states he will try.  Pt to return in 3 weeks for recheck.

## 2012-03-30 ENCOUNTER — Other Ambulatory Visit: Payer: Self-pay | Admitting: Family Medicine

## 2012-03-30 NOTE — Telephone Encounter (Signed)
This encounter was created in error - please disregard.

## 2012-04-06 ENCOUNTER — Encounter (HOSPITAL_BASED_OUTPATIENT_CLINIC_OR_DEPARTMENT_OTHER): Payer: Medicare Other

## 2012-04-28 ENCOUNTER — Ambulatory Visit: Payer: Medicare Other | Admitting: Family Medicine

## 2012-04-29 ENCOUNTER — Other Ambulatory Visit: Payer: Self-pay | Admitting: Family Medicine

## 2012-05-05 ENCOUNTER — Ambulatory Visit (INDEPENDENT_AMBULATORY_CARE_PROVIDER_SITE_OTHER): Payer: Medicare Other | Admitting: Family Medicine

## 2012-05-05 ENCOUNTER — Encounter: Payer: Self-pay | Admitting: Family Medicine

## 2012-05-05 DIAGNOSIS — R0781 Pleurodynia: Secondary | ICD-10-CM

## 2012-05-05 DIAGNOSIS — M86669 Other chronic osteomyelitis, unspecified tibia and fibula: Secondary | ICD-10-CM

## 2012-05-05 DIAGNOSIS — E785 Hyperlipidemia, unspecified: Secondary | ICD-10-CM

## 2012-05-05 DIAGNOSIS — E669 Obesity, unspecified: Secondary | ICD-10-CM

## 2012-05-05 DIAGNOSIS — E119 Type 2 diabetes mellitus without complications: Secondary | ICD-10-CM

## 2012-05-05 DIAGNOSIS — R079 Chest pain, unspecified: Secondary | ICD-10-CM

## 2012-05-05 DIAGNOSIS — IMO0001 Reserved for inherently not codable concepts without codable children: Secondary | ICD-10-CM

## 2012-05-05 DIAGNOSIS — I10 Essential (primary) hypertension: Secondary | ICD-10-CM

## 2012-05-05 LAB — COMPREHENSIVE METABOLIC PANEL
ALT: 9 U/L (ref 0–53)
AST: 11 U/L (ref 0–37)
CO2: 28 mEq/L (ref 19–32)
Calcium: 8.8 mg/dL (ref 8.4–10.5)
Chloride: 97 mEq/L (ref 96–112)
Potassium: 4.5 mEq/L (ref 3.5–5.3)
Sodium: 135 mEq/L (ref 135–145)
Total Protein: 7.5 g/dL (ref 6.0–8.3)

## 2012-05-05 LAB — CBC WITH DIFFERENTIAL/PLATELET
Lymphocytes Relative: 15 % (ref 12–46)
Lymphs Abs: 1.8 10*3/uL (ref 0.7–4.0)
Neutro Abs: 8.5 10*3/uL — ABNORMAL HIGH (ref 1.7–7.7)
Neutrophils Relative %: 73 % (ref 43–77)
Platelets: 306 10*3/uL (ref 150–400)
RBC: 4.38 MIL/uL (ref 4.22–5.81)
WBC: 11.6 10*3/uL — ABNORMAL HIGH (ref 4.0–10.5)

## 2012-05-05 LAB — LDL CHOLESTEROL, DIRECT: Direct LDL: 81 mg/dL

## 2012-05-05 LAB — TSH: TSH: 1.201 u[IU]/mL (ref 0.350–4.500)

## 2012-05-05 MED ORDER — DOXYCYCLINE HYCLATE 100 MG PO TABS: 100.0000 mg | ORAL_TABLET | Freq: Two times a day (BID) | ORAL | Status: AC

## 2012-05-05 NOTE — Assessment & Plan Note (Signed)
Pt encouraged to eat 3 small healthy meals per day.  Will need to discuss in more detail at next appt.

## 2012-05-05 NOTE — Progress Notes (Signed)
  Subjective:    Patient ID: [name], male    DOB: [birth year], 71 y.o.   MRN: [id number]  HPI Pain under left arm: Started yesterday, "soreness",  Pain worsens with deep breath, not worse with arm movement. No trauma.  No diaphoreisis. No nausea or vomiting.  No SOB.  No chest pain. No weakness.   Check right leg: Drainage- increased amount- since Sunday.  Clear color "like water",  No redness.  No fever.  No pain in area of leg.  Using mellohoney- that was given to patient by wound center.   Diabetes: Checks BG 1-2 x per week.  Running around 150.  "been running good"  Only checks in the morning.  Not writing down numbers.  No shaking.  No syncope.     Weight management: Eating 2-3 meals per day.  Sometimes skips breakfast.  About 3 fruits/ vegtables per day.    Foot exam: Needs foot exam for new pair of diabetic shoes.    Review of Systems As per above.     Objective:   Physical Exam  Constitutional: He appears well-developed and well-nourished.  HENT:  Head: Normocephalic and atraumatic.  Cardiovascular: Normal rate and regular rhythm.   Murmur (systolic murmur-) heard. Pulmonary/Chest: Effort normal and breath sounds normal. No respiratory distress. He has no wheezes. He has no rales.       + tenderness to palpation of left lateral rib area, no rash present on skin.  Musculoskeletal:       Small pencil sized opening in left medial lower leg- watery drainage present.  No pus. No redness. No warmth to touch.   Neurological: He is alert.  Skin: Skin is warm. No erythema.  Psychiatric: He has a normal mood and affect. His behavior is normal.    See foot exam flowsheet- performed today.       Assessment & Plan:

## 2012-05-05 NOTE — Patient Instructions (Addendum)
Leg drainage: Take doxycycline as directed.  Return for new or worsening of symptoms- especially if any fever, dizzines, body aches, other signs of infection.  Diabetes: Check blood sugar 2 x per day Bring blood glucose book with you to your next appointment so we can decide what insulin needs to be increased. Need to eat 3 smalls meals per day.  Left rib pain: You can take ibuprofen 600mg  3 x per day.    Return if new or worsening of symptoms.   Return in 1- 2 weeks for recheck or sooner.

## 2012-05-05 NOTE — Assessment & Plan Note (Addendum)
A1C in 01/2012 was 9.1- instructed pt to take bg level 2 x per day and return in 1-2 weeks with logbook so that we can titrate up on insulin.  Discussed in detail the long term complications of uncontrolled diabetes. Pt states understanding.

## 2012-05-05 NOTE — Assessment & Plan Note (Signed)
No known injury to left rib area- but worse on palpation and with deep breath- seems most consistent with msk etiology. Will treat with ibuprofen prn.  Pt to return if any new or worsening of symptoms or if no improvement.  F/up in 1 week.

## 2012-05-05 NOTE — Assessment & Plan Note (Addendum)
No signs of systemic infection. No local signs of redness, pain, or warmth-  But increased drainage from leg- will treat with doxycycline 100mg  po bid in the setting of chronic osteomylitis.  Reviewed signs and symptoms of systemic infection with patient and pt states understanding. Pt to return if any red flags occur.  Return in 1-2 weeks for recheck.

## 2012-05-10 ENCOUNTER — Encounter: Payer: Self-pay | Admitting: Family Medicine

## 2012-05-19 ENCOUNTER — Ambulatory Visit (INDEPENDENT_AMBULATORY_CARE_PROVIDER_SITE_OTHER): Payer: Medicare Other | Admitting: Family Medicine

## 2012-05-19 ENCOUNTER — Encounter: Payer: Self-pay | Admitting: Family Medicine

## 2012-05-19 VITALS — BP 117/69 | HR 53 | Ht 72.0 in

## 2012-05-19 DIAGNOSIS — IMO0001 Reserved for inherently not codable concepts without codable children: Secondary | ICD-10-CM

## 2012-05-19 DIAGNOSIS — C61 Malignant neoplasm of prostate: Secondary | ICD-10-CM

## 2012-05-19 DIAGNOSIS — M86669 Other chronic osteomyelitis, unspecified tibia and fibula: Secondary | ICD-10-CM

## 2012-05-19 DIAGNOSIS — D649 Anemia, unspecified: Secondary | ICD-10-CM

## 2012-05-19 LAB — POCT GLYCOSYLATED HEMOGLOBIN (HGB A1C): Hemoglobin A1C: 8.5

## 2012-05-19 NOTE — Progress Notes (Signed)
  Subjective:    Patient ID: [name], male    DOB: 1941-03-15, 71 y.o.   MRN: [id number]  HPI Leg ulcer-right leg-Followup: Seems to be improving. Back to normal size of chronic ulcer. No fever. No redness of area. No chills. No body aches. Finished doxycycline course. The decrease in drainage. Occasionally has clear serous drainage.   PSA: Patient reports that he does have a history of prostate cancer in past. Last followup with [facility] was per patient-8 months ago. Patient is unsure of why followup as needed. Does not remember when he supposed to have his next PSA. No urinary symptoms. No problems with stream. No fever.  Blood sugar: Patient brought blood sugar logbook with him today. Fasting blood sugars range from 69- low 100s. Has lots of postprandial highs in 200s and 300s. Eats small breakfast, no lunch, and a large dinner. Takes NovoLog 30 units at lunch and dinner. Patient A1c improved to 8.5 today. Was previously 9.1. Patient is not sure why the improvement A1c. Has not had any recent changes in diet or activity. No symptoms of low blood sugars. No shakiness. No syncope.  Anemia: Hemoglobin 11.1 at last check. Patient does not have any problems with energy level. The past history of anemia. Patient is past due for colonoscopy screening. No blood in stool. No dark stools.   Smoking status reviewed.   Review of Systems As per above.    Objective:   Physical Exam Constitutional: He appears well-developed and well-nourished.  HENT:  Head: Normocephalic and atraumatic.  Cardiovascular: Normal rate and regular rhythm.  Murmur (systolic murmur-) heard.  Pulmonary/Chest: Effort normal and breath sounds normal. No respiratory distress. He has no wheezes. He has no rales.  Musculoskeletal:  Small pencil sized opening in left medial lower leg- minimal drainage present. Dressing in place. No pus. No redness. No warmth to touch.  Neurological: He is alert.  Skin: Skin  is warm. No erythema.  Psychiatric: He has a normal mood and affect. His behavior is normal.        Assessment & Plan:

## 2012-05-19 NOTE — Patient Instructions (Signed)
Diabetes: 3 meals per day-  Use the plate method as a guide. Take novolog: Take 15 with breakfast 15 units with lunch And 30 at dinner.  Continue lantus as you are doing.   Return in 2-3 weeks for recheck.

## 2012-05-21 ENCOUNTER — Encounter: Payer: Self-pay | Admitting: Family Medicine

## 2012-05-21 DIAGNOSIS — D638 Anemia in other chronic diseases classified elsewhere: Secondary | ICD-10-CM | POA: Insufficient documentation

## 2012-05-21 NOTE — Assessment & Plan Note (Signed)
Of 11.1 found on last labs- need to discuss further with patient at next appt.

## 2012-05-21 NOTE — Assessment & Plan Note (Signed)
Pt has not followed up with urology regarding this in past year.  Would like to have PSA and any further screening done here at office if at all possible.  Will request records from [facility] today to get more details on this diagnosis as well as recommended f/up schedule and recommended f/up testing.  Will discuss in more detail at next appt.

## 2012-05-21 NOTE — Assessment & Plan Note (Signed)
Pt doesn't exercise.  Has been resistant in past to seeing PT to help develop a safe workout routine in the settng of his chronic msk injuries of his legs.  Pt is open to nutrition teaching by myself- resistant to seeing nutritionist.    Spent time discussing healthy plate method today- gave handout for further review at home.  Need to readdress lack of exercise at future appt.   Need to get a weight at next appt as well.

## 2012-05-21 NOTE — Assessment & Plan Note (Signed)
Decreased drainage. No signs of systemic infection. Almost back to baseline- just minimal drainage.  Pt to monitor closely and let me know if any changes in wound.  Doxy course complete.  No other antibiotics needed at this time.  Return for recheck in 2-4 weeks.

## 2012-05-21 NOTE — Assessment & Plan Note (Addendum)
A1C- 9.1 today. BG log shows 69-140's fasting- pp reads consistently 200-300.  Discussed the importance of 3 small healthy meals with snacks while on insulin to maintain balanced BG readings and improve a1c- and ultimately help him feel his best.  Pt to eat 3 meals per day.  Pt continue with lantus 70units bid.  Pt to use meal coverage with each of his 3 meals. Take novolog 15 with breakfast, 15 units with lunch, And 30 at dinner.  Reviewed with pt the plate method (gave handout) regarding healthy meals. Pt to continue to monitor and return with log book in 2-3 weeks.

## 2012-05-22 ENCOUNTER — Other Ambulatory Visit: Payer: Self-pay | Admitting: Family Medicine

## 2012-05-28 ENCOUNTER — Encounter: Payer: Self-pay | Admitting: Family Medicine

## 2012-06-11 ENCOUNTER — Ambulatory Visit: Payer: Medicare Other | Admitting: Family Medicine

## 2012-06-24 ENCOUNTER — Encounter (HOSPITAL_BASED_OUTPATIENT_CLINIC_OR_DEPARTMENT_OTHER): Payer: Medicare Other | Attending: General Surgery

## 2012-06-24 ENCOUNTER — Other Ambulatory Visit: Payer: Self-pay | Admitting: Family Medicine

## 2012-06-24 DIAGNOSIS — I10 Essential (primary) hypertension: Secondary | ICD-10-CM | POA: Insufficient documentation

## 2012-06-24 DIAGNOSIS — L97809 Non-pressure chronic ulcer of other part of unspecified lower leg with unspecified severity: Secondary | ICD-10-CM | POA: Insufficient documentation

## 2012-06-24 DIAGNOSIS — E119 Type 2 diabetes mellitus without complications: Secondary | ICD-10-CM | POA: Insufficient documentation

## 2012-06-24 DIAGNOSIS — I872 Venous insufficiency (chronic) (peripheral): Secondary | ICD-10-CM | POA: Insufficient documentation

## 2012-06-24 DIAGNOSIS — Z96649 Presence of unspecified artificial hip joint: Secondary | ICD-10-CM | POA: Insufficient documentation

## 2012-06-24 NOTE — Progress Notes (Signed)
[facility]  NAME:  [name], [name] NO.:  [id number]  MEDICAL RECORD NO.:  [id number]      DATE OF BIRTH:  [birth year]  PHYSICIAN:  Ardath Sax, M.D.           VISIT DATE:                                  OFFICE VISIT   He is a 71 year old morbidly obese African American man who was been here several times in the past.  He has a history of venous stasis.  He has diabetes, he has hypertension and of course he is morbidly obese. He enters here because now he has an ulcer that is draining from his right anterior leg.  He has had this before and Dr. Cheryll Cockayne took care of him at that time with antibiotics and worked him up for possible osteo and at that time apparently was negative.  He is draining some clear material from this deep ulcer which is from an old fracture and he has hardware in there.  He also has had bilateral hip replacements.  He has venous stasis and his skin is very dry.  Today, we did a culture.  I ordered an x-ray to rule out osteo of his right tibia.  We put Bag Balm on him and bilateral Unna boots.  He was found to be afebrile today and his pulse is 80, his respirations 20, his blood pressure is 160/80.  He had clear lungs.  His abdomen is soft, and extremely obese and we will plan on getting the x-ray and he will come back next week, and at that time, we will know the culture and we will probably start him on antibiotics and we will continue his Unna boots as he has got marked venous stasis along with his hypertension, diabetes, obesity.     Ardath Sax, M.D.     PP/MEDQ  D:  06/24/2012  T:  06/24/2012  Job:  [id number]

## 2012-06-29 ENCOUNTER — Telehealth: Payer: Self-pay | Admitting: Family Medicine

## 2012-06-29 ENCOUNTER — Other Ambulatory Visit: Payer: Self-pay | Admitting: Family Medicine

## 2012-06-29 DIAGNOSIS — Z8546 Personal history of malignant neoplasm of prostate: Secondary | ICD-10-CM

## 2012-06-29 DIAGNOSIS — C61 Malignant neoplasm of prostate: Secondary | ICD-10-CM

## 2012-06-29 NOTE — Telephone Encounter (Signed)
Called patient to update him on what I have learned from [facility] regarding his needed prostate cancer f/up.   Spoke with Dr. Annabell Howells and he states that pt was lost to f/up.  He recommends that pt come back in to see him.  Or at the least come in to family practice for PSA and testosterone level.   He states that when results are back to give him a call and that he will help interpret them.  He is concerned about pt since pt has had no f/up and his last visit at [facility] was in 2010.  He has diagnosis of T3N1 prostate cancer.  Since there was lymph node involvement f/up is very important.    Discussed the above with patient.  Pt states he would like to have lab work done first here at family practice.  Pt to call to schedule lab appointment and f/up appointment with me for the next week.  Future lab orders for PSA and testosterone placed.

## 2012-06-30 ENCOUNTER — Other Ambulatory Visit (HOSPITAL_BASED_OUTPATIENT_CLINIC_OR_DEPARTMENT_OTHER): Payer: Self-pay | Admitting: General Surgery

## 2012-06-30 ENCOUNTER — Ambulatory Visit (HOSPITAL_COMMUNITY)
Admission: RE | Admit: 2012-06-30 | Discharge: 2012-06-30 | Disposition: A | Discharge status: Home / Self Care | Payer: Medicare Other | Source: Ambulatory Visit | Attending: General Surgery | Admitting: General Surgery

## 2012-06-30 ENCOUNTER — Other Ambulatory Visit: Payer: Medicare Other

## 2012-06-30 DIAGNOSIS — M25579 Pain in unspecified ankle and joints of unspecified foot: Secondary | ICD-10-CM | POA: Insufficient documentation

## 2012-06-30 DIAGNOSIS — M25571 Pain in right ankle and joints of right foot: Secondary | ICD-10-CM

## 2012-06-30 DIAGNOSIS — M25473 Effusion, unspecified ankle: Secondary | ICD-10-CM | POA: Insufficient documentation

## 2012-06-30 DIAGNOSIS — Z8546 Personal history of malignant neoplasm of prostate: Secondary | ICD-10-CM

## 2012-06-30 DIAGNOSIS — M25476 Effusion, unspecified foot: Secondary | ICD-10-CM | POA: Insufficient documentation

## 2012-06-30 LAB — PSA: PSA: 12.24 ng/mL — ABNORMAL HIGH (ref <=4.00)

## 2012-06-30 NOTE — Progress Notes (Signed)
PSA AND TESTOSTRONE DONE TODAY [name]

## 2012-07-01 ENCOUNTER — Encounter (HOSPITAL_BASED_OUTPATIENT_CLINIC_OR_DEPARTMENT_OTHER): Payer: Medicare Other | Attending: General Surgery

## 2012-07-01 DIAGNOSIS — I872 Venous insufficiency (chronic) (peripheral): Secondary | ICD-10-CM | POA: Insufficient documentation

## 2012-07-01 DIAGNOSIS — Z794 Long term (current) use of insulin: Secondary | ICD-10-CM | POA: Insufficient documentation

## 2012-07-01 DIAGNOSIS — Z79899 Other long term (current) drug therapy: Secondary | ICD-10-CM | POA: Insufficient documentation

## 2012-07-01 DIAGNOSIS — E119 Type 2 diabetes mellitus without complications: Secondary | ICD-10-CM | POA: Insufficient documentation

## 2012-07-01 DIAGNOSIS — I10 Essential (primary) hypertension: Secondary | ICD-10-CM | POA: Insufficient documentation

## 2012-07-01 DIAGNOSIS — L97809 Non-pressure chronic ulcer of other part of unspecified lower leg with unspecified severity: Secondary | ICD-10-CM | POA: Insufficient documentation

## 2012-07-13 ENCOUNTER — Telehealth: Payer: Self-pay | Admitting: Family Medicine

## 2012-07-13 NOTE — Telephone Encounter (Signed)
Called to make sure that Dr. Annabell Howells received fax with pt PSA and testosterone.  He did receive it.  Nursing states that he will review and call me back.

## 2012-07-14 ENCOUNTER — Telehealth: Payer: Self-pay | Admitting: Family Medicine

## 2012-07-14 NOTE — Telephone Encounter (Signed)
Spoke with Dr. Annabell Howells yesterday on the phone regarding patient lab work.  He recommends that patient come into [facility] for evaluation since PSA and testosterone are both elevated. Called patient and asked him to please call [facility] and make an appointment.

## 2012-07-16 ENCOUNTER — Ambulatory Visit (INDEPENDENT_AMBULATORY_CARE_PROVIDER_SITE_OTHER): Payer: Medicare Other | Admitting: Family Medicine

## 2012-07-16 ENCOUNTER — Encounter: Payer: Self-pay | Admitting: Family Medicine

## 2012-07-16 VITALS — BP 134/74 | HR 62 | Ht 72.0 in | Wt >= 6400 oz

## 2012-07-16 DIAGNOSIS — C61 Malignant neoplasm of prostate: Secondary | ICD-10-CM

## 2012-07-16 DIAGNOSIS — J449 Chronic obstructive pulmonary disease, unspecified: Secondary | ICD-10-CM

## 2012-07-16 DIAGNOSIS — M86669 Other chronic osteomyelitis, unspecified tibia and fibula: Secondary | ICD-10-CM

## 2012-07-16 MED ORDER — INSULIN GLARGINE 100 UNIT/ML ~~LOC~~ SOLN: 60.0000 [IU] | Freq: Two times a day (BID) | SUBCUTANEOUS | Status: DC

## 2012-07-16 MED ORDER — GLUCOSE BLOOD VI STRP: ORAL_STRIP | Status: DC

## 2012-07-16 MED ORDER — BAYER CONTOUR MONITOR W/DEVICE KIT: 1.0000 | PACK | Status: DC

## 2012-07-16 NOTE — Patient Instructions (Addendum)
Breathing problems: I want you to schedule an appointment with Dr. Raymondo Band for Pulmonary Function Test asap so we can further evaluate why you have had problems breathing off and on.   Wounds: Continue f/up with wound center as you are doing.   Diabetes: Goal A1C is less than 7.  Continue your lantus and novolog- but need to check with glucometer Bring readings from glucometer to your next appointment.  So we can increase insulin to get you A1C down and this will improve your ability to heal faster.   Prostate cancer f/up: F/up with [facility].   Return for f/up on diabetes with your blood glucose log book in 2-4 weeks.

## 2012-07-16 NOTE — Assessment & Plan Note (Signed)
Pt lost to f/up to [facility] after treatment for prostate cancer.  Pt states that the reason he did not return for f/up was due to financial concerns. Some elevation in PSA and testosterone which is concerning.  Spoke with Dr. Donnetta Hail who recommended that pt come to his office to review labs and to do further evaluation and discussion.  Pt states understanding.  Has f/up appt scheduled.

## 2012-07-16 NOTE — Assessment & Plan Note (Signed)
Some continued sob during the past week.  Had episode of bronchitis in may and CXR that showed findings consistent with COPD. No previous h/o COPD. No smoking history.  Lung exam wnl today.  Pt agrees to set up appointment for PFT testing to better assess possible respiratory issues.
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
## 2012-07-16 NOTE — Assessment & Plan Note (Signed)
This chronic lesion plus new lesion on left lower leg being managed by wound care center weekly visits-  Unable to assess wounds due to wraps being in place.  Pt to f/up with wound center as scheduled. No fever.  No red flags of systemic infection.
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
## 2012-07-21 ENCOUNTER — Telehealth: Payer: Self-pay | Admitting: Family Medicine

## 2012-07-21 NOTE — Telephone Encounter (Signed)
Called pt regarding lymphedema pump order request- sent fax back to company and asked them to contact Dr. Jimmey Ralph- [facility] MD.  He will better know if this will be helpful or harmful for pt wounds and leg condition.  I am not sure if this will be beneficial so would like Dr. Lavone Neri input.  Pt states understanding.  Note faxed to [employer].

## 2012-07-22 ENCOUNTER — Encounter (HOSPITAL_BASED_OUTPATIENT_CLINIC_OR_DEPARTMENT_OTHER): Payer: Medicare Other

## 2012-08-05 ENCOUNTER — Encounter (HOSPITAL_BASED_OUTPATIENT_CLINIC_OR_DEPARTMENT_OTHER): Payer: Medicare Other | Attending: General Surgery

## 2012-08-05 DIAGNOSIS — I872 Venous insufficiency (chronic) (peripheral): Secondary | ICD-10-CM | POA: Insufficient documentation

## 2012-08-11 ENCOUNTER — Other Ambulatory Visit (HOSPITAL_COMMUNITY): Payer: Self-pay | Admitting: Orthopedic Surgery

## 2012-08-12 ENCOUNTER — Encounter (HOSPITAL_COMMUNITY): Payer: Self-pay

## 2012-08-13 ENCOUNTER — Encounter (HOSPITAL_COMMUNITY)
Admission: RE | Admit: 2012-08-13 | Discharge: 2012-08-13 | Disposition: A | Discharge status: Home / Self Care | Payer: Medicare Other | Source: Ambulatory Visit | Attending: Orthopedic Surgery | Admitting: Orthopedic Surgery

## 2012-08-13 ENCOUNTER — Encounter (HOSPITAL_COMMUNITY): Payer: Self-pay

## 2012-08-13 HISTORY — DX: Gastro-esophageal reflux disease without esophagitis: K21.9

## 2012-08-13 HISTORY — DX: Other complications of anesthesia, initial encounter: T88.59XA

## 2012-08-13 HISTORY — DX: Malignant neoplasm of prostate: C61

## 2012-08-13 HISTORY — DX: Nausea with vomiting, unspecified: R11.2

## 2012-08-13 HISTORY — DX: Cardiac murmur, unspecified: R01.1

## 2012-08-13 HISTORY — DX: Other specified postprocedural states: Z98.890

## 2012-08-13 HISTORY — DX: Adverse effect of unspecified anesthetic, initial encounter: T41.45XA

## 2012-08-13 HISTORY — DX: Essential (primary) hypertension: I10

## 2012-08-13 HISTORY — DX: Personal history of other medical treatment: Z92.89

## 2012-08-13 LAB — CBC WITH DIFFERENTIAL/PLATELET
Basophils Absolute: 0 10*3/uL (ref 0.0–0.1)
Basophils Relative: 0 % (ref 0–1)
Eosinophils Absolute: 0.4 10*3/uL (ref 0.0–0.7)
HCT: 33.1 % — ABNORMAL LOW (ref 39.0–52.0)
MCH: 26 pg (ref 26.0–34.0)
MCHC: 32 g/dL (ref 30.0–36.0)
Monocytes Absolute: 0.9 10*3/uL (ref 0.1–1.0)
Neutro Abs: 7 10*3/uL (ref 1.7–7.7)
RDW: 15.6 % — ABNORMAL HIGH (ref 11.5–15.5)

## 2012-08-13 LAB — COMPREHENSIVE METABOLIC PANEL
AST: 13 U/L (ref 0–37)
Albumin: 3 g/dL — ABNORMAL LOW (ref 3.5–5.2)
BUN: 22 mg/dL (ref 6–23)
Calcium: 9.2 mg/dL (ref 8.4–10.5)
Chloride: 100 mEq/L (ref 96–112)
Creatinine, Ser: 1.18 mg/dL (ref 0.50–1.35)
Total Bilirubin: 0.2 mg/dL — ABNORMAL LOW (ref 0.3–1.2)
Total Protein: 8.5 g/dL — ABNORMAL HIGH (ref 6.0–8.3)

## 2012-08-13 LAB — APTT: aPTT: 37 seconds (ref 24–37)

## 2012-08-13 LAB — PROTIME-INR
INR: 1.08 (ref 0.00–1.49)
Prothrombin Time: 14.2 seconds (ref 11.6–15.2)

## 2012-08-13 LAB — SURGICAL PCR SCREEN: Staphylococcus aureus: NEGATIVE

## 2012-08-13 MED ORDER — DEXTROSE 5 % IV SOLN
3.0000 g | INTRAVENOUS | Status: AC
  Administered 2012-08-14: 3 g via INTRAVENOUS
  Filled 2012-08-13: qty 3000

## 2012-08-13 NOTE — Pre-Procedure Instructions (Signed)
20 [name]  08/13/2012   Your procedure is scheduled on:  Friday, August 16th.  Report to [facility] at 8:00AM.  Call this number if you have problems the morning of surgery: [phone]   Remember:   Do not eat food or Drink any liquid:After Midnight   Take these medicines the morning of surgery with A SIP OF WATER:  Metoprolol (Lopressor),  Esomeprazole (Nexium), Doxycycline (Vibra- Tabs).  May take Tramadol (Ultram) if needed.  Use inhalers and bring Albuterol inhaler to the hospital with you.   Do not wear jewelry, make-up or nail polish.   Do not wear lotions, powders, or perfumes. You may wear deodorant.  Do not shave 48 hours prior to surgery. Men may shave face and neck.  Do not bring valuables to the hospital.  Contacts, dentures or bridgework may not be worn into surgery.  Leave suitcase in the car. After surgery it may be brought to your room.  For patients admitted to the hospital, checkout time is 11:00 AM the day of discharge.   Patients discharged the day of surgery will not be allowed to drive home.  Name and phone number of your driver: NA  Special Instructions: CHG Shower Use Special Wash: 1/2 bottle night before surgery and 1/2 bottle morning of surgery.   Please read over the following fact sheets that you were given: Pain Booklet, Coughing and Deep Breathing and Surgical Site Infection Prevention

## 2012-08-13 NOTE — Consult Note (Signed)
Anesthesia Chart Review:  Patient is a 71 year old male posted for I&D of RLE on 08/14/12.  PAT appointment was earlier today.  History includes morbid obesity with BMI 57.99, non-smoker, post-operative N/V, HTN, DM on insulin, HLD, COPD, anemia, prostate cancer, ED.  He is followed at [facility].  CXR on 08/13/12 showed Cardiomegaly without evidence of acute cardiopulmonary disease.  EKG on 08/13/12 showed SB.  BP was 99/66.  His PAT RN reported that he denied any symptoms such as dizziness, light-headedness.    Echo on 03/31/08 showed: - Overall left ventricular systolic function was normal. Left ventricular ejection fraction was estimated to be 65 %. There were no left ventricular regional wall motion abnormalities. Left ventricular wall thickness was mildly increased. - The right ventricle was not well seen and may be dilated.  His last stress test was on 02/24/06 as what appears part of a pre-operative evaluation prior to his left THA.  It showed:  Equivocal pharmacologic stress Myoview study revealing no significant stress-induced EKG abnormalities, mild left ventricular dilatation and mild left ventricular dysfunction in a segmental pattern. By scintigraphic imaging, it was unclear whether there was scarring or merely diaphragmatic and soft tissue attenuation in the inferior wall. No convincing evidence for ischemia. Other findings as noted.  He ultimately underwent a left total hip arthroplasty on 03/19/06 by Dr. Lajoyce Corners.    Labs noted.  He will be evaluated by his assigned Anesthesiologist on the day of surgery.  Shonna Chock, PA-C

## 2012-08-14 ENCOUNTER — Ambulatory Visit (HOSPITAL_COMMUNITY): Payer: Medicare Other | Admitting: Vascular Surgery

## 2012-08-14 ENCOUNTER — Ambulatory Visit: Payer: Medicare Other | Admitting: Family Medicine

## 2012-08-14 ENCOUNTER — Inpatient Hospital Stay (HOSPITAL_COMMUNITY)
Admission: RE | Admit: 2012-08-14 | Discharge: 2012-08-17 | DRG: 988 | Disposition: A | Discharge status: Home Health | Payer: Medicare Other | Source: Ambulatory Visit | Attending: Orthopedic Surgery | Admitting: Orthopedic Surgery

## 2012-08-14 ENCOUNTER — Encounter (HOSPITAL_COMMUNITY): Payer: Self-pay | Admitting: Anesthesiology

## 2012-08-14 ENCOUNTER — Encounter (HOSPITAL_COMMUNITY): Payer: Self-pay | Admitting: Vascular Surgery

## 2012-08-14 ENCOUNTER — Ambulatory Visit: Payer: Medicare Other | Admitting: Pharmacist

## 2012-08-14 ENCOUNTER — Encounter (HOSPITAL_COMMUNITY)
Admission: RE | Disposition: A | Discharge status: Home Health | Payer: Self-pay | Source: Ambulatory Visit | Attending: Orthopedic Surgery

## 2012-08-14 DIAGNOSIS — L089 Local infection of the skin and subcutaneous tissue, unspecified: Secondary | ICD-10-CM

## 2012-08-14 DIAGNOSIS — Z79899 Other long term (current) drug therapy: Secondary | ICD-10-CM

## 2012-08-14 DIAGNOSIS — Z96649 Presence of unspecified artificial hip joint: Secondary | ICD-10-CM

## 2012-08-14 DIAGNOSIS — J449 Chronic obstructive pulmonary disease, unspecified: Secondary | ICD-10-CM | POA: Diagnosis present

## 2012-08-14 DIAGNOSIS — Z7982 Long term (current) use of aspirin: Secondary | ICD-10-CM

## 2012-08-14 DIAGNOSIS — M86669 Other chronic osteomyelitis, unspecified tibia and fibula: Secondary | ICD-10-CM | POA: Diagnosis present

## 2012-08-14 DIAGNOSIS — L97909 Non-pressure chronic ulcer of unspecified part of unspecified lower leg with unspecified severity: Secondary | ICD-10-CM | POA: Diagnosis present

## 2012-08-14 DIAGNOSIS — K449 Diaphragmatic hernia without obstruction or gangrene: Secondary | ICD-10-CM | POA: Diagnosis present

## 2012-08-14 DIAGNOSIS — C61 Malignant neoplasm of prostate: Secondary | ICD-10-CM | POA: Diagnosis present

## 2012-08-14 DIAGNOSIS — Z6841 Body Mass Index (BMI) 40.0 and over, adult: Secondary | ICD-10-CM

## 2012-08-14 DIAGNOSIS — M1990 Unspecified osteoarthritis, unspecified site: Secondary | ICD-10-CM | POA: Diagnosis present

## 2012-08-14 DIAGNOSIS — Z01811 Encounter for preprocedural respiratory examination: Secondary | ICD-10-CM

## 2012-08-14 DIAGNOSIS — E1169 Type 2 diabetes mellitus with other specified complication: Principal | ICD-10-CM | POA: Diagnosis present

## 2012-08-14 DIAGNOSIS — I872 Venous insufficiency (chronic) (peripheral): Secondary | ICD-10-CM | POA: Diagnosis present

## 2012-08-14 DIAGNOSIS — Z01818 Encounter for other preprocedural examination: Secondary | ICD-10-CM

## 2012-08-14 DIAGNOSIS — I10 Essential (primary) hypertension: Secondary | ICD-10-CM | POA: Diagnosis present

## 2012-08-14 DIAGNOSIS — J4489 Other specified chronic obstructive pulmonary disease: Secondary | ICD-10-CM | POA: Diagnosis present

## 2012-08-14 DIAGNOSIS — Z01810 Encounter for preprocedural cardiovascular examination: Secondary | ICD-10-CM

## 2012-08-14 DIAGNOSIS — Z981 Arthrodesis status: Secondary | ICD-10-CM

## 2012-08-14 DIAGNOSIS — Z01812 Encounter for preprocedural laboratory examination: Secondary | ICD-10-CM

## 2012-08-14 DIAGNOSIS — M9080 Osteopathy in diseases classified elsewhere, unspecified site: Secondary | ICD-10-CM | POA: Diagnosis present

## 2012-08-14 DIAGNOSIS — K219 Gastro-esophageal reflux disease without esophagitis: Secondary | ICD-10-CM | POA: Diagnosis present

## 2012-08-14 HISTORY — PX: I&D EXTREMITY: SHX5045

## 2012-08-14 LAB — GLUCOSE, CAPILLARY
Glucose-Capillary: 67 mg/dL — ABNORMAL LOW (ref 70–99)
Glucose-Capillary: 98 mg/dL (ref 70–99)
Glucose-Capillary: 91 mg/dL (ref 70–99)

## 2012-08-14 SURGERY — IRRIGATION AND DEBRIDEMENT EXTREMITY
Anesthesia: General | Site: Leg Lower | Laterality: Right | Wound class: Dirty or Infected

## 2012-08-14 MED ORDER — INSULIN ASPART 100 UNIT/ML ~~LOC~~ SOLN
4.0000 [IU] | Freq: Three times a day (TID) | SUBCUTANEOUS | Status: DC
  Administered 2012-08-15 – 2012-08-16 (×4): 4 [IU] via SUBCUTANEOUS

## 2012-08-14 MED ORDER — GENTAMICIN SULFATE 40 MG/ML IJ SOLN
INTRAMUSCULAR | Status: DC | PRN
  Administered 2012-08-14: 80 mg via INTRAMUSCULAR

## 2012-08-14 MED ORDER — FLUTICASONE PROPIONATE 0.05 % EX CREA: 1.0000 "application " | TOPICAL_CREAM | Freq: Two times a day (BID) | CUTANEOUS | Status: DC

## 2012-08-14 MED ORDER — WARFARIN - PHARMACIST DOSING INPATIENT
Freq: Every day | Status: DC
  Administered 2012-08-14: 18:00:00

## 2012-08-14 MED ORDER — SODIUM CHLORIDE 0.9 % IV SOLN
INTRAVENOUS | Status: DC
  Administered 2012-08-14: 20 mL/h via INTRAVENOUS

## 2012-08-14 MED ORDER — TRAMADOL HCL 50 MG PO TABS
50.0000 mg | ORAL_TABLET | Freq: Two times a day (BID) | ORAL | Status: DC
  Administered 2012-08-14 – 2012-08-17 (×7): 50 mg via ORAL
  Filled 2012-08-14: qty 1
  Filled 2012-08-14: qty 2
  Filled 2012-08-14 (×5): qty 1

## 2012-08-14 MED ORDER — LATANOPROST 0.005 % OP SOLN
1.0000 [drp] | Freq: Every day | OPHTHALMIC | Status: DC
  Administered 2012-08-14 – 2012-08-16 (×3): 1 [drp] via OPHTHALMIC
  Filled 2012-08-14: qty 2.5

## 2012-08-14 MED ORDER — PROPOFOL 10 MG/ML IV BOLUS
INTRAVENOUS | Status: DC | PRN
  Administered 2012-08-14: 200 mg via INTRAVENOUS

## 2012-08-14 MED ORDER — FLUTICASONE PROPIONATE HFA 220 MCG/ACT IN AERO
1.0000 | INHALATION_SPRAY | Freq: Two times a day (BID) | RESPIRATORY_TRACT | Status: DC
  Administered 2012-08-14 – 2012-08-17 (×6): 1 via RESPIRATORY_TRACT
  Filled 2012-08-14: qty 12

## 2012-08-14 MED ORDER — DEXTROSE 50 % IV SOLN
12.5000 g | Freq: Once | INTRAVENOUS | Status: AC
  Administered 2012-08-14: 12.5 g via INTRAVENOUS

## 2012-08-14 MED ORDER — OXYCODONE-ACETAMINOPHEN 5-325 MG PO TABS: 1.0000 | ORAL_TABLET | ORAL | Status: DC | PRN

## 2012-08-14 MED ORDER — CEFAZOLIN SODIUM-DEXTROSE 2-3 GM-% IV SOLR
2.0000 g | Freq: Four times a day (QID) | INTRAVENOUS | Status: AC
  Administered 2012-08-14 – 2012-08-15 (×3): 2 g via INTRAVENOUS
  Filled 2012-08-14 (×4): qty 50

## 2012-08-14 MED ORDER — METOPROLOL TARTRATE 100 MG PO TABS
100.0000 mg | ORAL_TABLET | Freq: Two times a day (BID) | ORAL | Status: DC
  Administered 2012-08-14: 100 mg via ORAL
  Administered 2012-08-15: 50 mg via ORAL
  Administered 2012-08-15 – 2012-08-17 (×4): 100 mg via ORAL
  Filled 2012-08-14 (×7): qty 1

## 2012-08-14 MED ORDER — ALBUTEROL SULFATE HFA 108 (90 BASE) MCG/ACT IN AERS
2.0000 | INHALATION_SPRAY | RESPIRATORY_TRACT | Status: DC | PRN
  Filled 2012-08-14: qty 6.7

## 2012-08-14 MED ORDER — FLUOCINONIDE 0.05 % EX CREA
TOPICAL_CREAM | Freq: Two times a day (BID) | CUTANEOUS | Status: DC
  Administered 2012-08-14 – 2012-08-17 (×3): via TOPICAL
  Filled 2012-08-14: qty 30

## 2012-08-14 MED ORDER — LACTATED RINGERS IV SOLN
INTRAVENOUS | Status: DC | PRN
  Administered 2012-08-14: 10:00:00 via INTRAVENOUS

## 2012-08-14 MED ORDER — METOCLOPRAMIDE HCL 10 MG PO TABS: 5.0000 mg | ORAL_TABLET | Freq: Three times a day (TID) | ORAL | Status: DC | PRN

## 2012-08-14 MED ORDER — ONDANSETRON HCL 4 MG/2ML IJ SOLN
4.0000 mg | Freq: Once | INTRAMUSCULAR | Status: DC | PRN
Start: 2012-08-14 — End: 2012-08-14

## 2012-08-14 MED ORDER — FLUOCINONIDE 0.05 % EX CREA
TOPICAL_CREAM | Freq: Two times a day (BID) | CUTANEOUS | Status: DC
  Administered 2012-08-14 – 2012-08-17 (×2): via TOPICAL
  Filled 2012-08-14: qty 15

## 2012-08-14 MED ORDER — ONDANSETRON HCL 4 MG/2ML IJ SOLN
4.0000 mg | Freq: Four times a day (QID) | INTRAMUSCULAR | Status: DC | PRN
  Administered 2012-08-15: 4 mg via INTRAVENOUS
  Filled 2012-08-14: qty 2

## 2012-08-14 MED ORDER — SIMVASTATIN 5 MG PO TABS
5.0000 mg | ORAL_TABLET | Freq: Every day | ORAL | Status: DC
  Administered 2012-08-14 – 2012-08-16 (×3): 5 mg via ORAL
  Filled 2012-08-14 (×5): qty 1

## 2012-08-14 MED ORDER — TAMSULOSIN HCL 0.4 MG PO CAPS
0.4000 mg | ORAL_CAPSULE | Freq: Two times a day (BID) | ORAL | Status: DC
  Administered 2012-08-14 – 2012-08-17 (×6): 0.4 mg via ORAL
  Filled 2012-08-14 (×7): qty 1

## 2012-08-14 MED ORDER — HYDROCODONE-ACETAMINOPHEN 5-325 MG PO TABS: 1.0000 | ORAL_TABLET | ORAL | Status: DC | PRN

## 2012-08-14 MED ORDER — GLYCOPYRROLATE 0.2 MG/ML IJ SOLN
INTRAMUSCULAR | Status: DC | PRN
  Administered 2012-08-14: 0.2 mg via INTRAVENOUS

## 2012-08-14 MED ORDER — MIDAZOLAM HCL 5 MG/5ML IJ SOLN
INTRAMUSCULAR | Status: DC | PRN
  Administered 2012-08-14: 1 mg via INTRAVENOUS

## 2012-08-14 MED ORDER — HYDROMORPHONE HCL PF 1 MG/ML IJ SOLN
INTRAMUSCULAR | Status: AC
  Filled 2012-08-14: qty 1

## 2012-08-14 MED ORDER — INSULIN GLARGINE 100 UNIT/ML ~~LOC~~ SOLN: 20.0000 [IU] | Freq: Every day | SUBCUTANEOUS | Status: DC

## 2012-08-14 MED ORDER — FENTANYL CITRATE 0.05 MG/ML IJ SOLN
INTRAMUSCULAR | Status: DC | PRN
  Administered 2012-08-14: 50 ug via INTRAVENOUS
  Administered 2012-08-14: 25 ug via INTRAVENOUS
  Administered 2012-08-14 (×2): 50 ug via INTRAVENOUS

## 2012-08-14 MED ORDER — VANCOMYCIN HCL 1000 MG IV SOLR
INTRAVENOUS | Status: DC | PRN
  Administered 2012-08-14: 1000 mg

## 2012-08-14 MED ORDER — ONDANSETRON HCL 4 MG/2ML IJ SOLN
INTRAMUSCULAR | Status: DC | PRN
  Administered 2012-08-14: 4 mg via INTRAVENOUS

## 2012-08-14 MED ORDER — TRIAMTERENE-HCTZ 37.5-25 MG PO CAPS
1.0000 | ORAL_CAPSULE | Freq: Two times a day (BID) | ORAL | Status: DC
  Filled 2012-08-14 (×2): qty 1

## 2012-08-14 MED ORDER — FLUTICASONE PROPIONATE 50 MCG/ACT NA SUSP
1.0000 | Freq: Every day | NASAL | Status: DC
  Administered 2012-08-15 – 2012-08-17 (×3): 1 via NASAL
  Filled 2012-08-14: qty 16

## 2012-08-14 MED ORDER — PANTOPRAZOLE SODIUM 40 MG PO TBEC
40.0000 mg | DELAYED_RELEASE_TABLET | Freq: Every day | ORAL | Status: DC
  Administered 2012-08-15 – 2012-08-17 (×3): 40 mg via ORAL
  Filled 2012-08-14 (×3): qty 1

## 2012-08-14 MED ORDER — HYDROMORPHONE HCL PF 1 MG/ML IJ SOLN
0.2500 mg | INTRAMUSCULAR | Status: DC | PRN
  Administered 2012-08-14 (×2): 0.25 mg via INTRAVENOUS

## 2012-08-14 MED ORDER — WARFARIN SODIUM 7.5 MG PO TABS
7.5000 mg | ORAL_TABLET | Freq: Once | ORAL | Status: AC
  Administered 2012-08-14: 7.5 mg via ORAL
  Filled 2012-08-14: qty 1

## 2012-08-14 MED ORDER — HYDROMORPHONE HCL PF 1 MG/ML IJ SOLN
0.5000 mg | INTRAMUSCULAR | Status: DC | PRN
  Administered 2012-08-14: 1 mg via INTRAVENOUS
  Filled 2012-08-14: qty 1

## 2012-08-14 MED ORDER — FUROSEMIDE 40 MG PO TABS
40.0000 mg | ORAL_TABLET | Freq: Two times a day (BID) | ORAL | Status: DC
  Administered 2012-08-15 – 2012-08-17 (×5): 40 mg via ORAL
  Filled 2012-08-14 (×9): qty 1

## 2012-08-14 MED ORDER — SODIUM CHLORIDE 0.9 % IR SOLN
Status: DC | PRN
  Administered 2012-08-14: 3000 mL

## 2012-08-14 MED ORDER — ASPIRIN 81 MG PO CHEW
81.0000 mg | CHEWABLE_TABLET | Freq: Every day | ORAL | Status: DC
  Administered 2012-08-14 – 2012-08-17 (×4): 81 mg via ORAL
  Filled 2012-08-14 (×4): qty 1

## 2012-08-14 MED ORDER — EPHEDRINE SULFATE 50 MG/ML IJ SOLN
INTRAMUSCULAR | Status: DC | PRN
  Administered 2012-08-14: 15 mg via INTRAVENOUS
  Administered 2012-08-14: 10 mg via INTRAVENOUS

## 2012-08-14 MED ORDER — TRIAMTERENE-HCTZ 37.5-25 MG PO TABS
1.0000 | ORAL_TABLET | Freq: Two times a day (BID) | ORAL | Status: DC
  Administered 2012-08-14 – 2012-08-17 (×6): 1 via ORAL
  Filled 2012-08-14 (×8): qty 1

## 2012-08-14 MED ORDER — INSULIN ASPART 100 UNIT/ML ~~LOC~~ SOLN
0.0000 [IU] | Freq: Three times a day (TID) | SUBCUTANEOUS | Status: DC
  Administered 2012-08-16 (×2): 2 [IU] via SUBCUTANEOUS

## 2012-08-14 MED ORDER — LIDOCAINE HCL (CARDIAC) 20 MG/ML IV SOLN
INTRAVENOUS | Status: DC | PRN
  Administered 2012-08-14: 80 mg via INTRAVENOUS

## 2012-08-14 MED ORDER — METOCLOPRAMIDE HCL 5 MG/ML IJ SOLN
5.0000 mg | Freq: Three times a day (TID) | INTRAMUSCULAR | Status: DC | PRN
  Administered 2012-08-15: 10 mg via INTRAVENOUS
  Filled 2012-08-14: qty 2

## 2012-08-14 MED ORDER — DOXYCYCLINE HYCLATE 100 MG PO TABS
100.0000 mg | ORAL_TABLET | Freq: Two times a day (BID) | ORAL | Status: DC
  Administered 2012-08-14 – 2012-08-17 (×7): 100 mg via ORAL
  Filled 2012-08-14 (×8): qty 1

## 2012-08-14 MED ORDER — INSULIN GLARGINE 100 UNIT/ML ~~LOC~~ SOLN
70.0000 [IU] | Freq: Two times a day (BID) | SUBCUTANEOUS | Status: DC
  Administered 2012-08-14: 70 [IU] via SUBCUTANEOUS
  Administered 2012-08-15 – 2012-08-16 (×3): 35 [IU] via SUBCUTANEOUS
  Administered 2012-08-17: 70 [IU] via SUBCUTANEOUS

## 2012-08-14 MED ORDER — ONDANSETRON HCL 4 MG PO TABS: 4.0000 mg | ORAL_TABLET | Freq: Four times a day (QID) | ORAL | Status: DC | PRN

## 2012-08-14 SURGICAL SUPPLY — 48 items
BANDAGE GAUZE ELAST BULKY 4 IN (GAUZE/BANDAGES/DRESSINGS) ×2 IMPLANT
BLADE SURG 10 STRL SS (BLADE) IMPLANT
BNDG COHESIVE 4X5 TAN STRL (GAUZE/BANDAGES/DRESSINGS) ×2 IMPLANT
BNDG COHESIVE 6X5 TAN STRL LF (GAUZE/BANDAGES/DRESSINGS) ×2 IMPLANT
BNDG GAUZE STRTCH 6 (GAUZE/BANDAGES/DRESSINGS) IMPLANT
CLOTH BEACON ORANGE TIMEOUT ST (SAFETY) ×2 IMPLANT
COTTON STERILE ROLL (GAUZE/BANDAGES/DRESSINGS) ×2 IMPLANT
COVER SURGICAL LIGHT HANDLE (MISCELLANEOUS) ×2 IMPLANT
CUFF TOURNIQUET SINGLE 18IN (TOURNIQUET CUFF) ×2 IMPLANT
CUFF TOURNIQUET SINGLE 24IN (TOURNIQUET CUFF) IMPLANT
CUFF TOURNIQUET SINGLE 34IN LL (TOURNIQUET CUFF) IMPLANT
CUFF TOURNIQUET SINGLE 44IN (TOURNIQUET CUFF) IMPLANT
DRAPE U-SHAPE 47X51 STRL (DRAPES) ×2 IMPLANT
DRSG ADAPTIC 3X8 NADH LF (GAUZE/BANDAGES/DRESSINGS) ×2 IMPLANT
DRSG PAD ABDOMINAL 8X10 ST (GAUZE/BANDAGES/DRESSINGS) ×2 IMPLANT
DURAPREP 26ML APPLICATOR (WOUND CARE) ×2 IMPLANT
ELECT CAUTERY BLADE 6.4 (BLADE) IMPLANT
ELECT REM PT RETURN 9FT ADLT (ELECTROSURGICAL) ×2
ELECTRODE REM PT RTRN 9FT ADLT (ELECTROSURGICAL) ×1 IMPLANT
GLOVE BIOGEL PI IND STRL 9 (GLOVE) ×1 IMPLANT
GLOVE BIOGEL PI INDICATOR 9 (GLOVE) ×1
GLOVE SURG ORTHO 9.0 STRL STRW (GLOVE) ×2 IMPLANT
GOWN PREVENTION PLUS XLARGE (GOWN DISPOSABLE) ×2 IMPLANT
GOWN SRG XL XLNG 56XLVL 4 (GOWN DISPOSABLE) ×1 IMPLANT
GOWN STRL NON-REIN XL XLG LVL4 (GOWN DISPOSABLE) ×1
HANDPIECE INTERPULSE COAX TIP (DISPOSABLE)
KIT BASIN OR (CUSTOM PROCEDURE TRAY) ×2 IMPLANT
KIT ROOM TURNOVER OR (KITS) ×2 IMPLANT
KIT STIMULAN RAPID CURE  10CC (Orthopedic Implant) ×1 IMPLANT
KIT STIMULAN RAPID CURE 10CC (Orthopedic Implant) ×1 IMPLANT
MANIFOLD NEPTUNE II (INSTRUMENTS) ×2 IMPLANT
NS IRRIG 1000ML POUR BTL (IV SOLUTION) ×2 IMPLANT
PACK ORTHO EXTREMITY (CUSTOM PROCEDURE TRAY) ×2 IMPLANT
PAD ARMBOARD 7.5X6 YLW CONV (MISCELLANEOUS) ×4 IMPLANT
PADDING CAST COTTON 6X4 STRL (CAST SUPPLIES) IMPLANT
SET HNDPC FAN SPRY TIP SCT (DISPOSABLE) IMPLANT
SPONGE GAUZE 4X4 12PLY (GAUZE/BANDAGES/DRESSINGS) ×2 IMPLANT
SPONGE LAP 18X18 X RAY DECT (DISPOSABLE) ×2 IMPLANT
STOCKINETTE IMPERVIOUS 9X36 MD (GAUZE/BANDAGES/DRESSINGS) IMPLANT
STOCKINETTE IMPERVIOUS LG (DRAPES) ×2 IMPLANT
SUT ETHILON 2 0 PSLX (SUTURE) ×4 IMPLANT
TOWEL OR 17X24 6PK STRL BLUE (TOWEL DISPOSABLE) ×2 IMPLANT
TOWEL OR 17X26 10 PK STRL BLUE (TOWEL DISPOSABLE) ×2 IMPLANT
TUBE ANAEROBIC SPECIMEN COL (MISCELLANEOUS) IMPLANT
TUBE CONNECTING 12X1/4 (SUCTIONS) ×2 IMPLANT
UNDERPAD 30X30 INCONTINENT (UNDERPADS AND DIAPERS) ×2 IMPLANT
WATER STERILE IRR 1000ML POUR (IV SOLUTION) IMPLANT
YANKAUER SUCT BULB TIP NO VENT (SUCTIONS) ×2 IMPLANT

## 2012-08-14 NOTE — Anesthesia Postprocedure Evaluation (Signed)
  Anesthesia Post-op Note  Patient: [name]  Procedure(s) Performed: Procedure(s) (LRB): IRRIGATION AND DEBRIDEMENT EXTREMITY (Right)  Patient Location: PACU  Anesthesia Type: General  Level of Consciousness: awake  Airway and Oxygen Therapy: Patient Spontanous Breathing and Patient connected to nasal cannula oxygen  Post-op Pain: none  Post-op Assessment: Post-op Vital signs reviewed  Post-op Vital Signs: Reviewed  Complications: No apparent anesthesia complications

## 2012-08-14 NOTE — Progress Notes (Signed)
0815 CBG =79.  Pt asymptomatic.  Informed Dr Ivin Booty.  No new orders.

## 2012-08-14 NOTE — Transfer of Care (Signed)
Immediate Anesthesia Transfer of Care Note  Patient: [name]  Procedure(s) Performed: Procedure(s) (LRB): IRRIGATION AND DEBRIDEMENT EXTREMITY (Right)  Patient Location: PACU  Anesthesia Type: General  Level of Consciousness: awake, alert  and oriented  Airway & Oxygen Therapy: Patient Spontanous Breathing and Patient connected to nasal cannula oxygen  Post-op Assessment: Report given to PACU RN and Post -op Vital signs reviewed and stable  Post vital signs: Reviewed and stable  Complications: No apparent anesthesia complications

## 2012-08-14 NOTE — Progress Notes (Signed)
cbg 91 

## 2012-08-14 NOTE — Anesthesia Preprocedure Evaluation (Addendum)
Anesthesia Evaluation  Patient identified by MRN, date of birth, ID band Patient awake    Reviewed: Allergy & Precautions, H&P , NPO status , Patient's Chart, lab work & pertinent test results  History of Anesthesia Complications (+) PONV  Airway Mallampati: I TM Distance: >3 FB Neck ROM: Full    Dental  (+) Teeth Intact and Dental Advisory Given   Pulmonary COPD COPD inhaler,  breath sounds clear to auscultation        Cardiovascular hypertension, Pt. on medications and Pt. on home beta blockers Rhythm:Regular     Neuro/Psych    GI/Hepatic GERD-  Medicated and Controlled,  Endo/Other  Type 2, Insulin Dependent  Renal/GU      Musculoskeletal   Abdominal   Peds  Hematology   Anesthesia Other Findings   Reproductive/Obstetrics                          Anesthesia Physical Anesthesia Plan  ASA: III  Anesthesia Plan: General   Post-op Pain Management:    Induction: Intravenous  Airway Management Planned: LMA  Additional Equipment:   Intra-op Plan:   Post-operative Plan: Extubation in OR  Informed Consent: I have reviewed the patients History and Physical, chart, labs and discussed the procedure including the risks, benefits and alternatives for the proposed anesthesia with the patient or authorized representative who has indicated his/her understanding and acceptance.   Dental advisory given  Plan Discussed with: CRNA, Anesthesiologist and Surgeon  Anesthesia Plan Comments:         Anesthesia Quick Evaluation

## 2012-08-14 NOTE — Preoperative (Signed)
Beta Blockers   Reason not to administer Beta Blockers:Not Applicable 

## 2012-08-14 NOTE — Progress Notes (Signed)
Received report at 1211

## 2012-08-14 NOTE — Progress Notes (Signed)
ANTICOAGULATION CONSULT NOTE - Initial Consult  Pharmacy Consult for Coumadin Indication: VTE prophylaxis s/p I&D-chronic osteomyelitis of right tibia  Allergies  Allergen Reactions  . Ace Inhibitors     REACTION: dry cough  . Angiotensin Receptor Blockers     REACTION: Caused excessive weight gain  . Lisinopril     REACTION: cough  . Metformin     REACTION: diarrhea    Patient Measurements: Height: 6' 0.05" (183 cm) Weight: 427 lb 11.1 oz (194 kg) IBW/kg (Calculated) : 77.71   Vital Signs: Temp: 97.4 F (36.3 C) (08/16 1420) Temp src: Oral (08/16 0816) BP: 125/53 mmHg (08/16 1420) Pulse Rate: 68  (08/16 1420)  Labs:  Basename 08/13/12 1159  HGB 10.6*  HCT 33.1*  PLT 299  APTT 37  LABPROT 14.2  INR 1.08  HEPARINUNFRC --  CREATININE 1.18  CKTOTAL --  CKMB --  TROPONINI --    Estimated Creatinine Clearance: 99.4 ml/min (by C-G formula based on Cr of 1.18).   Medical History: Past Medical History  Diagnosis Date  . COLON POLYP 02/26/2007    Qualifier: Diagnosis of  By: Sandria Manly  MD, MELISSA    . HIATAL HERNIA WITH REFLUX 09/29/2006    Qualifier: Diagnosis of  By: Sandria Manly  MD, MELISSA    . HIP REPLACEMENT, BILATERAL, HX OF 05/12/2007    Qualifier: Diagnosis of  By: Sandria Manly  MD, MELISSA    . OSTEOARTHRITIS 03/05/2007    Qualifier: Diagnosis of  By: Sandria Manly  MD, MELISSA    . VENOUS INSUFFICIENCY, CHRONIC 02/26/2007    Qualifier: Diagnosis of  By: Sandria Manly  MD, MELISSA    . Diabetes mellitus   . Hypertension   . Heart murmur     last 2D Echo -03/31/08  . Complication of anesthesia   . PONV (postoperative nausea and vomiting)   . Prostate cancer   . GERD (gastroesophageal reflux disease)   . History of blood transfusion     Medications:  Scheduled:    . aspirin  81 mg Oral Daily  .  ceFAZolin (ANCEF) IV  3 g Intravenous 60 min Pre-Op  .  ceFAZolin (ANCEF) IV  2 g Intravenous Q6H  . dextrose  12.5 g Intravenous Once  . doxycycline  100 mg Oral BID  . fluticasone  1  application Topical BID  . fluticasone  1 spray Each Nare Daily  . fluticasone  1 puff Inhalation BID  . furosemide  40 mg Oral BID  . HYDROmorphone      . insulin aspart  0-15 Units Subcutaneous TID WC  . insulin aspart  4 Units Subcutaneous TID WC  . insulin glargine  20 Units Subcutaneous QHS  . insulin glargine  70 Units Subcutaneous BID  . latanoprost  1 drop Both Eyes QHS  . metoprolol  100 mg Oral BID  . pantoprazole  40 mg Oral Q breakfast  . simvastatin  5 mg Oral q1800  . Tamsulosin HCl  0.4 mg Oral BID  . traMADol  50 mg Oral BID  . triamterene-hydrochlorothiazide  1 each Oral BID    Assessment: 71 yr old male on coumadin for VTE prophylaxis s/p I&D of right tibia/chronic ostelmyelitis.  Goal of Therapy:  INR 2-3 Monitor platelets by anticoagulation protocol: Yes   Plan:  Coumadin 7.5mg  po x 1 dose tonight. Daily PT/INR.  Wendie Simmer, PharmD, BCPS Clinical Pharmacist  Pager: [phone]

## 2012-08-14 NOTE — Op Note (Signed)
OPERATIVE REPORT  DATE OF SURGERY: 08/14/2012  PATIENT:  [name],  71 y.o. male  PRE-OPERATIVE DIAGNOSIS:  Chronic Osteomyelitis Right Tibia  POST-OPERATIVE DIAGNOSIS:  Chronic Osteomyelitis Right Tibia  PROCEDURE:  Procedure(s): IRRIGATION AND DEBRIDEMENT EXTREMITY Excisional debridement right tibia Placement of antibiotic beads with 1 g vancomycin and 240 mg gentamicin Local tissue rearrangement for wound closure 10 cm in length 2 cm in width  SURGEON:  Surgeon(s): Nadara Mustard, MD  ANESTHESIA:   general  EBL:  Minimal ML  SPECIMEN:  No Specimen  TOURNIQUET:  * No tourniquets in log *  PROCEDURE DETAILS:  patient is a 71 year old gentleman with chronic osteomyelitis of his right tibia he has had recurrent drainage and ulceration presents at this time for surgical intervention. Risk and benefits were discussed including nonhealing of the wound s continued osteomyelitis and need for additional surgery potential for amputation. Patient states he understands and wishes to proceed at this time. Description of procedure patient was brought to the OR and underwent a general anesthetic. After adequate levels of anesthesia were obtained patient's right lower extremity was prepped using DuraPrep and draped into a sterile field an incision was made longitudinally along his previous medial incision over the tibia. The draining sinus tract was ellipsed out in one block of tissue. Using an osteotome the tibia was debrided back to bleeding viable healthy tibial bone. The canal was entered and curetted the wound is irrigated with pulsatile lavage the canal was packed with the antibiotic bead local tissue rearrangement was then performed to close the wound with 2-0 nylon. This was a 10 x 2 cm in length. The wound was covered with Adaptic orthopedic sponges AB dressing Kerlix and Coban. Patient was extubated taken to the PACU in stable condition.  PLAN OF CARE: Admit to inpatient   PATIENT  DISPOSITION:  PACU - hemodynamically stable.   Nadara Mustard, MD 08/14/2012 12:11 PM

## 2012-08-14 NOTE — Plan of Care (Signed)
Problem: Diagnosis - Type of Surgery Goal: General Surgical Patient Education (See Patient Education module for education specifics) Right lower leg IND

## 2012-08-14 NOTE — H&P (Signed)
[name] is an 71 y.o. male.   Chief Complaint: Recurrent chronic osteomyelitis right tibia HPI: Patient is a 71 year old gentleman with history of chronic osteomyelitis of the right tibia. He presents at this time with recurrent ulceration recurrent osteomyelitis and presents for irrigation debridement placement of antibiotic beads.  Past Medical History  Diagnosis Date  . COLON POLYP 02/26/2007    Qualifier: Diagnosis of  By: Joel Manly  MD, [name]    . HIATAL HERNIA WITH REFLUX 09/29/2006    Qualifier: Diagnosis of  By: Joel Manly  MD, [name]    . HIP REPLACEMENT, BILATERAL, HX OF 05/12/2007    Qualifier: Diagnosis of  By: Joel Manly  MD, [name]    . OSTEOARTHRITIS 03/05/2007    Qualifier: Diagnosis of  By: Joel Manly  MD, [name]    . VENOUS INSUFFICIENCY, CHRONIC 02/26/2007    Qualifier: Diagnosis of  By: Joel Manly  MD, [name]    . Diabetes mellitus   . Hypertension   . Heart murmur     last 2D Echo -03/31/08  . Complication of anesthesia   . PONV (postoperative nausea and vomiting)   . Prostate cancer   . GERD (gastroesophageal reflux disease)   . History of blood transfusion     Past Surgical History  Procedure Date  . Hip arthroplasty     bil  . Leg wound repair / closure     Beads .  Skin wound  . Skin graft     right leg- from donor skin  . Skin graft 1976    R wrist   skin graft from left thigh  . Wrist fusion     right wrist  . Colonoscopy   . Upper gastrointestinal endoscopy   . Leg surgery     ROD for fracture  . Wisdom tooth extraction     No family history on file. Social History:  reports that he has never smoked. He has never used smokeless tobacco. He reports that he does not drink alcohol or use illicit drugs.  Allergies:  Allergies  Allergen Reactions  . Ace Inhibitors     REACTION: dry cough  . Angiotensin Receptor Blockers     REACTION: Caused excessive weight gain  . Lisinopril     REACTION: cough  . Metformin     REACTION: diarrhea    Medications Prior to  Admission  Medication Sig Dispense Refill  . albuterol (PROVENTIL HFA;VENTOLIN HFA) 108 (90 BASE) MCG/ACT inhaler Inhale 2 puffs into the lungs every 4 (four) hours as needed. For wheezing and shortness of breath      . aspirin 81 MG chewable tablet Chew 81 mg by mouth daily.       . B-D INS SYR ULTRAFINE 1CC/31G 31G X 5/16" 1 ML MISC USE TO INJECT INSULIN  100 each  PRN  . Blood Glucose Monitoring Suppl (BAYER CONTOUR MONITOR) W/DEVICE KIT 1 Device by Does not apply route as directed. Check BG 3 x per day.  Insulin dependent- ICD code- 250.0  1 kit  0  . doxycycline (VIBRA-TABS) 100 MG tablet Take 100 mg by mouth 2 (two) times daily.      Marland Kitchen esomeprazole (NEXIUM) 40 MG capsule Take 40 mg by mouth 2 (two) times daily.      . fluticasone (CUTIVATE) 0.05 % cream Apply 1 application topically 2 (two) times daily.      . fluticasone (FLOVENT HFA) 220 MCG/ACT inhaler Inhale 1 puff into the lungs 2 (two) times daily.      Marland Kitchen  furosemide (LASIX) 40 MG tablet Take 40 mg by mouth 2 (two) times daily.      Marland Kitchen glucose blood (BAYER CONTOUR TEST) test strip Use as instructed  100 each  PRN  . insulin aspart (NOVOLOG) 100 UNIT/ML injection Inject 30 Units into the skin 2 (two) times daily before lunch and supper.       . insulin glargine (LANTUS) 100 UNIT/ML injection Inject 70 Units into the skin 2 (two) times daily.      Marland Kitchen latanoprost (XALATAN) 0.005 % ophthalmic solution Place 1 drop into both eyes at bedtime.      . metoprolol (LOPRESSOR) 100 MG tablet Take 100 mg by mouth 2 (two) times daily.      . mometasone (NASONEX) 50 MCG/ACT nasal spray Place 1 spray into the nose 2 (two) times daily. Both nostrils      . pravastatin (PRAVACHOL) 20 MG tablet Take 20 mg by mouth 2 (two) times daily.      Marland Kitchen SAFETY SEAL LANCETS MISC USE TO CHECK BLOOD SUGAR TWICE DAILY  100 each  PRN  . Tamsulosin HCl (FLOMAX) 0.4 MG CAPS Take 0.4 mg by mouth 2 (two) times daily.      . traMADol (ULTRAM) 50 MG tablet Take 50 mg by mouth 2  (two) times daily.      Marland Kitchen triamterene-hydrochlorothiazide (DYAZIDE) 37.5-25 MG per capsule Take 1 capsule by mouth 2 (two) times daily.        Results for orders placed during the hospital encounter of 08/14/12 (from the past 48 hour(s))  GLUCOSE, CAPILLARY     Status: Normal   Collection Time   08/14/12  8:14 AM      Component Value Range Comment   Glucose-Capillary 79  70 - 99 mg/dL   GLUCOSE, CAPILLARY     Status: Abnormal   Collection Time   08/14/12 10:13 AM      Component Value Range Comment   Glucose-Capillary 67 (*) 70 - 99 mg/dL   GLUCOSE, CAPILLARY     Status: Normal   Collection Time   08/14/12 10:44 AM      Component Value Range Comment   Glucose-Capillary 98  70 - 99 mg/dL    Dg Chest 2 View  1/61/0960  *RADIOLOGY REPORT*  Clinical Data: Preoperative respiratory examination for chronic right tibial osteomyelitis irrigation and debridement.  CHEST - 2 VIEW  Comparison: 05/04/2011  Findings: Mild cardiomegaly is noted. Mild peribronchial thickening is unchanged. There is no evidence of focal airspace disease, pulmonary edema, suspicious pulmonary nodule/mass, pleural effusion, or pneumothorax. No acute bony abnormalities are identified.  IMPRESSION: Cardiomegaly without evidence of acute cardiopulmonary disease.  Original Report Authenticated By: [name], M.D.    Review of Systems  All other systems reviewed and are negative.    Blood pressure 108/59, pulse 70, temperature 97.9 F (36.6 C), temperature source Oral, resp. rate 20, SpO2 93.00%. Physical Exam  On examination patient has recurrent ulceration over the tibia from his chronic osteomyelitis. There is drainage the wound probes to bone. Radiographic findings are consistent with chronic osteomyelitis. Assessment/Plan Assessment: Chronic osteomyelitis right tibia.  Plan: We'll plan for irrigation and debridement with excision of the bone placement of antibiotic beads. Risks and benefits of surgery were  discussed patient states he understands and wished to proceed at this time.  [name] V 08/14/2012, 10:55 AM

## 2012-08-15 LAB — GLUCOSE, CAPILLARY
Glucose-Capillary: 91 mg/dL (ref 70–99)
Glucose-Capillary: 92 mg/dL (ref 70–99)
Glucose-Capillary: 89 mg/dL (ref 70–99)

## 2012-08-15 MED ORDER — INSULIN GLARGINE 100 UNIT/ML ~~LOC~~ SOLN
35.0000 [IU] | Freq: Once | SUBCUTANEOUS | Status: AC
  Administered 2012-08-15: 35 [IU] via SUBCUTANEOUS

## 2012-08-15 MED ORDER — WARFARIN SODIUM 7.5 MG PO TABS
7.5000 mg | ORAL_TABLET | Freq: Once | ORAL | Status: AC
  Administered 2012-08-15: 7.5 mg via ORAL
  Filled 2012-08-15: qty 1

## 2012-08-15 NOTE — Evaluation (Signed)
Physical Therapy Evaluation Patient Details Name: [name] MRN: [id number] DOB: 1941-07-11 Today's Date: 08/15/2012 Time: 4098-1191 PT Time Calculation (min): 23 min  PT Assessment / Plan / Recommendation Clinical Impression  Pt s/p I&D for osteomyelitis for right tibia. Pt is at his baseline functional mobility, no acute PT needs. Will not follow.    PT Assessment  Patent does not need any further PT services    Follow Up Recommendations  No PT follow up       Equipment Recommendations  None recommended by PT          Precautions / Restrictions Precautions Precautions: None Restrictions Weight Bearing Restrictions: No Other Position/Activity Restrictions: WBAT   Pertinent Vitals/Pain Pain 3/10 on right side       Mobility  Bed Mobility Bed Mobility: Supine to Sit;Sitting - Scoot to Edge of Bed;Sit to Supine Supine to Sit: 7: Independent Sitting - Scoot to Edge of Bed: 7: Independent Sit to Supine: 7: Independent Transfers Transfers: Sit to Stand;Stand to Sit Sit to Stand: 5: Supervision;With upper extremity assist;From bed Stand to Sit: 5: Supervision;With upper extremity assist;To bed Details for Transfer Assistance: Supervision for safety as pt slow to complete Ambulation/Gait Ambulation/Gait Assistance: 5: Supervision Ambulation Distance (Feet): 40 Feet Assistive device: Crutches Ambulation/Gait Assistance Details: Supervision for safety. Pt with no difficulties, or loss of balance. Pt is at his baseline ambulation Gait Pattern: Within Functional Limits (wide base of support, increased lateral sway 2nd obesity) Gait velocity: decreased gait speed Stairs: No      Visit Information  Last PT Received On: 08/15/12 Assistance Needed: +1    Subjective Data  Patient Stated Goal: to go home   Prior Functioning  Home Living Lives With: Alone Available Help at Discharge: Other (Comment) (no assist) Type of Home: Independent living facility  (apartment) Home Access: Elevator Home Layout: One level Bathroom Shower/Tub: Tub/shower unit;Curtain Bathroom Toilet: Handicapped height Bathroom Accessibility: Yes How Accessible: Accessible via walker Home Adaptive Equipment: Grab bars in shower;Grab bars around toilet;Crutches;Tub transfer bench;Straight cane;Other (comment) (scooter) Additional Comments: gets around most of the time on the scooter Prior Function Level of Independence: Independent Able to Take Stairs?: Yes Driving: Yes Vocation: Retired Musician: No difficulties Dominant Hand: Right    Cognition  Overall Cognitive Status: Appears within functional limits for tasks assessed/performed Arousal/Alertness: Awake/alert Orientation Level: Appears intact for tasks assessed Behavior During Session: [facility] for tasks performed    Extremity/Trunk Assessment Right Lower Extremity Assessment RLE ROM/Strength/Tone: Unable to fully assess;Due to pain;Deficits RLE ROM/Strength/Tone Deficits: unable to assess foot. Knee and Hip WFL Left Lower Extremity Assessment LLE ROM/Strength/Tone: Deficits;Unable to fully assess;Due to pain LLE ROM/Strength/Tone Deficits: pt with wound on L calf, did not perform MMT. functionally WFL   Balance    End of Session PT - End of Session Activity Tolerance: Patient tolerated treatment well Patient left: in bed;with call bell/phone within reach Nurse Communication: Mobility status    Milana Kidney 08/15/2012, 11:54 AM  08/15/2012 Milana Kidney DPT PAGER: [phone] OFFICE: [phone]

## 2012-08-15 NOTE — Progress Notes (Signed)
ANTICOAGULATION CONSULT NOTE - Follow Up Consult  Pharmacy Consult for Coumadin Indication: VTE prophylaxis s/p I&D and bone excision w/ abx bead placement for chronic osteomyelitis of right tibia  Allergies  Allergen Reactions  . Ace Inhibitors     REACTION: dry cough  . Angiotensin Receptor Blockers     REACTION: Caused excessive weight gain  . Lisinopril     REACTION: cough  . Metformin     REACTION: diarrhea   Patient Measurements: Height: 6' 0.05" (183 cm) Weight: 427 lb 11.1 oz (194 kg) IBW/kg (Calculated) : 77.71  Vital Signs: Temp: 98.5 F (36.9 C) (08/17 0716) BP: 124/62 mmHg (08/17 0716) Pulse Rate: 69  (08/17 0716)  Labs:  Basename 08/15/12 0415 08/13/12 1159  HGB -- 10.6*  HCT -- 33.1*  PLT -- 299  APTT -- 37  LABPROT 15.2 14.2  INR 1.18 1.08  HEPARINUNFRC -- --  CREATININE -- 1.18  CKTOTAL -- --  CKMB -- --  TROPONINI -- --   Estimated Creatinine Clearance: 99.4 ml/min (by C-G formula based on Cr of 1.18).  Assessment: 21 YOM s/p I&D and excision of bone for abx bead placement for right tibia with recurrent, chronic osteomyelitis on Coumadin for VTE prophylaxis. INR is 1.18 today- up slightly after 7.5mg  yesterday.   Goal of Therapy:  INR 2-3   Plan:  1. Coumadin 7.5mg  po x1 at 1800.  2. Daily PT/INR  Link Snuffer, PharmD, BCPS Clinical Pharmacist [phone] 08/15/2012,8:51 AM

## 2012-08-15 NOTE — Progress Notes (Addendum)
Subjective: Pt stable pain controlled   Objective: Vital signs in last 24 hours: Temp:  [97.2 F (36.2 C)-98.7 F (37.1 C)] 98.5 F (36.9 C) (08/17 0716) Pulse Rate:  [63-72] 69  (08/17 0716) Resp:  [10-18] 18  (08/17 0716) BP: (105-148)/(53-91) 124/62 mmHg (08/17 0716) SpO2:  [89 %-100 %] 89 % (08/17 0859) Weight:  [194 kg (427 lb 11.1 oz)] 194 kg (427 lb 11.1 oz) (08/16 1505) Estimated Body mass index is 57.93 kg/(m^2) as calculated from the following:   Height as of this encounter: 6' .047"(1.83 m).   Weight as of this encounter: 427 lb 11.1 oz(194 kg).  Intake/Output from previous day: 08/16 0701 - 08/17 0700 In: 910 [P.O.:60; I.V.:850] Out: 420 [Urine:400; Blood:20] Intake/Output this shift: Total I/O In: -  Out: 600 [Urine:600]  Exam:  Sensation intact distally Intact pulses distally  Labs:  Basename 08/13/12 1159  HGB 10.6*    Basename 08/13/12 1159  WBC 10.1  RBC 4.08*  HCT 33.1*  PLT 299    Basename 08/13/12 1159  NA 140  K 3.8  CL 100  CO2 31  BUN 22  CREATININE 1.18  GLUCOSE 71  CALCIUM 9.2    Basename 08/15/12 0415 08/13/12 1159  LABPT -- --  INR 1.18 1.08    Assessment/Plan: Pt stable - keep dressing intact - possible dc next week Morbid obesity DEAN,GREGORY SCOTT 08/15/2012, 10:09 AM

## 2012-08-15 NOTE — Clinical Documentation Improvement (Signed)
BMI DOCUMENTATION CLARIFICATION QUERY  THIS DOCUMENT IS NOT A PERMANENT PART OF THE MEDICAL RECORD  TO RESPOND TO THE THIS QUERY, FOLLOW THE INSTRUCTIONS BELOW:  1. If needed, update documentation for the patient's encounter via the notes activity.  2. Access this query again and click edit on the In [employer].  3. After updating, or not, click F2 to complete all highlighted (required) fields concerning your review. Select "additional documentation in the medical record" OR "no additional documentation provided".  4. Click Sign note button.  5. The deficiency will fall out of your In Basket *Please let us know if you are not able to complete this workflow by phone or e-mail (listed below).         08/15/12  Dear Maud Deed PA Marton Redwood  In an effort to better capture your patient's severity of illness, reflect appropriate length of stay and utilization of resources, a review of the patient medical record has revealed the following indicators.    Based on your clinical judgment, please clarify and document in a progress note and/or discharge summary the clinical condition associated with the following supporting information:  In responding to this query please exercise your independent judgment.  The fact that a query is asked, does not imply that any particular answer is desired or expected.  Per Ht/Wt on flow sheet patient's BMI 58.1 if appropriate state secondary diagnosis regarding BMI.  Thank you   Possible Clinical conditions  Morbid Obesity (range 40.1-49.9 or >)  Obesity (range 30.1-39.9)  Underweight (range < 19)  Other condition  Cannot Clinically determine   Risk Factors: chronic osteomyelitis, dm type 2, htn, oa    Weight: 427 lb 11.1 oz (194 kg)  Height: 6 ft ( 182.88 cm)  BMI=  58.1    Reviewed: additional documentation in the medical record  Thank You,  Leonette Most Addison  Clinical Documentation Specialist RN, BSN:  Pager [phone] Him  off # [phone]  Health Information Management [place]

## 2012-08-16 LAB — PROTIME-INR
INR: 1.19 (ref 0.00–1.49)
Prothrombin Time: 15.4 seconds — ABNORMAL HIGH (ref 11.6–15.2)

## 2012-08-16 LAB — GLUCOSE, CAPILLARY: Glucose-Capillary: 81 mg/dL (ref 70–99)

## 2012-08-16 MED ORDER — WARFARIN SODIUM 10 MG PO TABS
10.0000 mg | ORAL_TABLET | Freq: Once | ORAL | Status: AC
  Administered 2012-08-16: 10 mg via ORAL
  Filled 2012-08-16: qty 1

## 2012-08-16 NOTE — Progress Notes (Signed)
ANTICOAGULATION CONSULT NOTE - Follow Up Consult  Pharmacy Consult for Coumadin Indication: VTE prophylaxis s/p I&D and bone excision w/ abx bead placement for chronic osteomyelitis of right tibia  Allergies  Allergen Reactions  . Ace Inhibitors     REACTION: dry cough  . Angiotensin Receptor Blockers     REACTION: Caused excessive weight gain  . Lisinopril     REACTION: cough  . Metformin     REACTION: diarrhea   Patient Measurements: Height: 6' 0.05" (183 cm) Weight: 427 lb 11.1 oz (194 kg) IBW/kg (Calculated) : 77.71  Vital Signs: Temp: 97.6 F (36.4 C) (08/18 0619) BP: 147/57 mmHg (08/18 0619) Pulse Rate: 83  (08/18 0619)  Labs:  Basename 08/16/12 0615 08/15/12 0415 08/13/12 1159  HGB -- -- 10.6*  HCT -- -- 33.1*  PLT -- -- 299  APTT -- -- 37  LABPROT 15.4* 15.2 14.2  INR 1.19 1.18 1.08  HEPARINUNFRC -- -- --  CREATININE -- -- 1.18  CKTOTAL -- -- --  CKMB -- -- --  TROPONINI -- -- --   Estimated Creatinine Clearance: 99.4 ml/min (by C-G formula based on Cr of 1.18).  Assessment: 6 YOM s/p I&D and excision of bone for abx bead placement for right tibia with recurrent, chronic osteomyelitis on Coumadin for VTE prophylaxis. INR is 1.19 today- will give increased dose today. No bleeding reported. Patient is on doxycycline (unsure of indication) which can interact with INR.   Goal of Therapy:  INR 2-3  Plan:  1. Coumadin 10mg  po x1 at 1800.  2. Daily PT/INR 3. Education is complete.   Link Snuffer, PharmD, BCPS Clinical Pharmacist [phone] 08/16/2012,7:32 AM

## 2012-08-16 NOTE — Progress Notes (Signed)
Legs stable - dressings ok - on abx - possible dc next week

## 2012-08-17 ENCOUNTER — Encounter (HOSPITAL_COMMUNITY): Payer: Self-pay | Admitting: Orthopedic Surgery

## 2012-08-17 LAB — GLUCOSE, CAPILLARY
Glucose-Capillary: 49 mg/dL — ABNORMAL LOW (ref 70–99)
Glucose-Capillary: 61 mg/dL — ABNORMAL LOW (ref 70–99)

## 2012-08-17 MED ORDER — HYDROCODONE-ACETAMINOPHEN 5-325 MG PO TABS: 1.0000 | ORAL_TABLET | ORAL | Status: AC | PRN

## 2012-08-17 NOTE — Progress Notes (Signed)
Pt stable - pain controlled - dc today

## 2012-08-17 NOTE — Progress Notes (Signed)
Hypoglycemic Event  CBG: 49  Treatment: 15 GM carbohydrate snack  Symptoms: None  Follow-up CBG: Time:0725 CBG Result:61   Possible Reasons for Event:   Comments/MD notified:no    [name]  Remember to initiate Hypoglycemia Order Set & complete

## 2012-08-17 NOTE — Care Management Note (Signed)
    Page 1 of 1   08/17/2012     5:50:50 PM   CARE MANAGEMENT NOTE 08/17/2012  Patient:  [name], [name]   Account Number:  [id number]  Date Initiated:  08/17/2012  Documentation initiated by:  [facility]  Subjective/Objective Assessment:   Admitted osteomyelitis rt tibia     Action/Plan:   Anticipated DC Date:  08/17/2012   Anticipated DC Plan:  HOME W HOME HEALTH SERVICES      DC Planning Services  CM consult      Choice offered to / List presented to:  C-1 Patient        HH arranged  HH-1 RN      [facility] agency  [facility]   Status of service:  Completed, signed off Medicare Important Message given?   (If response is "NO", the following Medicare IM given date fields will be blank) Date Medicare IM given:   Date Additional Medicare IM given:    Discharge Disposition:  HOME W HOME HEALTH SERVICES  Per UR Regulation:  Reviewed for med. necessity/level of care/duration of stay  If discussed at Long Length of Stay Meetings, dates discussed:    Comments:  08/17/12 Spoke with patient about HHC. He chose [facility] for [facility]. Contacted Jamie at [facility] and requested [facility], will need to draw PT/INR on 08/19/12 and call results to PCP Dr. Clinton Sawyer. Conatacted Dr. Yetta Numbers office and informed them of PT/INR being drawn by [facility] on 08/19/12. They will monitor PT/INR. Jacquelynn Cree RN, BSN, CCM

## 2012-08-19 ENCOUNTER — Other Ambulatory Visit: Payer: Self-pay | Admitting: Family Medicine

## 2012-08-19 ENCOUNTER — Telehealth: Payer: Self-pay | Admitting: Family Medicine

## 2012-08-19 NOTE — Telephone Encounter (Signed)
Nurse is calling with PT/INR results.  They are PT - 21.3 and INR - 1.8.  He is currently took 5mg  of Coumadin last night.

## 2012-08-19 NOTE — Telephone Encounter (Signed)
It appears that the patient was started on Coumadin by the orthopedic surgeon last week. I am not planning to manage this medication, since I did not prescribe it. Please ask the nurse to call the office of Dr. Lajoyce Corners for recommendations. Thank you.

## 2012-08-19 NOTE — Telephone Encounter (Signed)
LMOM advising [facility] nurse to call Dr Audrie Lia office [id number] mgt.

## 2012-08-19 NOTE — Telephone Encounter (Signed)
Will forward to dr Williamson 

## 2012-08-21 NOTE — Discharge Summary (Signed)
Physician Discharge Summary  Patient ID: [name] MRN: [id number] DOB/AGE: 07-04-1941 71 y.o.  Admit date: 08/14/2012 Discharge date: 08/21/2012  Admission Diagnoses: Chronic osteomyelitis right tibia with chronic sinus draining tract.  Discharge Diagnoses: Same Active Problems:  Morbid obesity with body mass index of 50.0-59.9 in adult   Discharged Conditistable240}  Hospital Course: Patient's hospital course was essentially unremarkable. He underwent open irrigation and excisional debridement of skin soft tissue and bone. The bony canal was opened debrided with sharp instruments antibiotic beads were placed deep within the canal and the traumatic wound was closed. Patient postoperative course is essentially unremarkable and he was discharged to home in stable condition.  Consults: None  Significant Diagnostic Studies: labs: Routine labs   surgery: Please see operative note   Discharge Exam: Blood pressure 123/69, pulse 62, temperature 97.9 F (36.6 C), temperature source Oral, resp. rate 16, height 6' 0.05" (1.83 m), weight 194 kg (427 lb 11.1 oz), SpO2 97.00%. Incision/Wound: incision clean and dry at time of discharge  Disposition: 06-Home-Health Care Svc  Discharge Orders    Future Appointments: Provider: Department: Dept Phone: Center:   09/02/2012 8:00 AM [facility] [phone] [facility]     Future Orders Please Complete By Expires   Diet - low sodium heart healthy      Call MD / Call 911      Comments:   If you experience chest pain or shortness of breath, CALL 911 and be transported to the hospital emergency room.  If you develope a fever above 101 F, pus (white drainage) or increased drainage or redness at the wound, or calf pain, call your surgeon's office.   Constipation Prevention      Comments:   Drink plenty of fluids.  Prune juice may be helpful.  You may use a stool softener, such as Colace (over the counter) 100 mg twice a  day.  Use MiraLax (over the counter) for constipation as needed.   Increase activity slowly as tolerated      Discharge instructions      Comments:   Elevate leg keep dressing dry     Medication List  As of 08/21/2012  6:19 AM   TAKE these medications         albuterol 108 (90 BASE) MCG/ACT inhaler   Commonly known as: PROVENTIL HFA;VENTOLIN HFA   Inhale 2 puffs into the lungs every 4 (four) hours as needed. For wheezing and shortness of breath      aspirin 81 MG chewable tablet   Chew 81 mg by mouth daily.      B-D INS SYR ULTRAFINE 1CC/31G 31G X 5/16" 1 ML Misc   Generic drug: Insulin Syringe-Needle U-100   USE TO INJECT INSULIN      BAYER CONTOUR MONITOR W/DEVICE Kit   1 Device by Does not apply route as directed. Check BG 3 x per day.  Insulin dependent- ICD code- 250.0      doxycycline 100 MG tablet   Commonly known as: VIBRA-TABS   Take 100 mg by mouth 2 (two) times daily.      esomeprazole 40 MG capsule   Commonly known as: NEXIUM   Take 40 mg by mouth 2 (two) times daily.      fluticasone 0.05 % cream   Commonly known as: CUTIVATE   Apply 1 application topically 2 (two) times daily.      fluticasone 220 MCG/ACT inhaler   Commonly known as: FLOVENT HFA  Inhale 1 puff into the lungs 2 (two) times daily.      furosemide 40 MG tablet   Commonly known as: LASIX   Take 40 mg by mouth 2 (two) times daily.      glucose blood test strip   Use as instructed      HYDROcodone-acetaminophen 5-325 MG per tablet   Commonly known as: NORCO/VICODIN   Take 1-2 tablets by mouth every 4 (four) hours as needed.      insulin glargine 100 UNIT/ML injection   Commonly known as: LANTUS   Inject 70 Units into the skin 2 (two) times daily.      latanoprost 0.005 % ophthalmic solution   Commonly known as: XALATAN   Place 1 drop into both eyes at bedtime.      metoprolol 100 MG tablet   Commonly known as: LOPRESSOR   Take 100 mg by mouth 2 (two) times daily.      mometasone  50 MCG/ACT nasal spray   Commonly known as: NASONEX   Place 1 spray into the nose 2 (two) times daily. Both nostrils      NOVOLOG 100 UNIT/ML injection   Generic drug: insulin aspart   Inject 30 Units into the skin 2 (two) times daily before lunch and supper.      pravastatin 20 MG tablet   Commonly known as: PRAVACHOL   Take 20 mg by mouth 2 (two) times daily.      SAFETY SEAL LANCETS Misc   USE TO CHECK BLOOD SUGAR TWICE DAILY      Tamsulosin HCl 0.4 MG Caps   Commonly known as: FLOMAX   Take 0.4 mg by mouth 2 (two) times daily.      traMADol 50 MG tablet   Commonly known as: ULTRAM   Take 50 mg by mouth 2 (two) times daily.      triamterene-hydrochlorothiazide 37.5-25 MG per capsule   Commonly known as: DYAZIDE   Take 1 capsule by mouth 2 (two) times daily.             Signed: Stevey Stapleton V 08/21/2012, 6:19 AM

## 2012-08-25 ENCOUNTER — Telehealth: Payer: Self-pay | Admitting: Family Medicine

## 2012-08-25 NOTE — Telephone Encounter (Signed)
Fwd. To Dr.Williamson. .Quinn Quam  

## 2012-08-25 NOTE — Telephone Encounter (Signed)
Called in PT/INR - states that Dr Lajoyce Corners said to call results to Dr Clinton Sawyer PT - 27.8 INR - 2.3

## 2012-08-26 ENCOUNTER — Telehealth: Payer: Self-pay | Admitting: Family Medicine

## 2012-08-26 NOTE — Telephone Encounter (Signed)
I called [name] today to see introduce myself and see how his recovery was going from surgery. He is feeling well and plans to see Dr. Lajoyce Corners on Friday for a follow up. He notes that he was started on blood thinners for a 3 weeks course by Dr. Lajoyce Corners. His INR is currently 2.3, but he does remember off hand his daily dose. I told him that he should continue the current dose, because it was therapeutic. I also told him I would contact Dr. Lajoyce Corners about who should manage the INR. He was in agreement with this plan.

## 2012-08-27 ENCOUNTER — Telehealth: Payer: Self-pay | Admitting: *Deleted

## 2012-08-27 NOTE — Telephone Encounter (Signed)
Tiffany calling from [facility].    INR on Mr. Guadamuz today was 2.1.  Patient is taking Coumadin 5 mg daily in the evening.  They do not have any further orders on Mr. Guerreiro.  If Dr. Clinton Sawyer wants [facility] to continue checking his INR, they will need more orders.  Tiffany can be reached at [phone] or [phone].  Will forward to Dr. Clinton Sawyer for review.  [name]

## 2012-08-27 NOTE — Telephone Encounter (Signed)
I am still confused as to who started him on this coumadin dose and ordered the INR checks, because it was not me. Mr. Slate had surgery performed on 08/14/12 by Aldean Baker, MD. I called Dr. Lajoyce Corners and asked about the patient's regimen, and he states that the patient is supposed to be taking 1 mg a day for one month without INR checks. I left a message for Lavera Guise at [facility] to try to figure this out.

## 2012-08-27 NOTE — Telephone Encounter (Signed)
Call received from Tiffany with [facility]. She stated that the coumadin was started in the hospital by Dr. Lajoyce Corners, but he stated that this should be managed by her PCP, which is me. After speaking to Dr. Lajoyce Corners today, he likes to have the patient anticoagulated for a month post-operative. Therefore, I placed a verbal order with Tiffany to have [facility] manage his coumadin for the next two weeks.

## 2012-09-01 ENCOUNTER — Telehealth: Payer: Self-pay | Admitting: *Deleted

## 2012-09-01 DIAGNOSIS — Z299 Encounter for prophylactic measures, unspecified: Secondary | ICD-10-CM

## 2012-09-01 MED ORDER — WARFARIN SODIUM 5 MG PO TABS: 5.0000 mg | ORAL_TABLET | Freq: Every day | ORAL | Status: DC

## 2012-09-01 NOTE — Telephone Encounter (Signed)
This patient needs to be on anti-coagulation for 4 weeks postoperatively according to his orthopedic surgeon. The doses and INR checks are being managed by the [facility] pharmacy until 09/11/12. At this date, his anti-coagulation should be stopped.

## 2012-09-01 NOTE — Telephone Encounter (Signed)
Judeth Cornfield from [facility] calling requesting a refill for Coumadin 5 mg for Mr. Kalman.  States he does not have enough to complete his dose.  States patients pharmacy is [facility].  Will route to Dr. Clinton Sawyer to authorize refill.  [name]

## 2012-09-02 ENCOUNTER — Encounter (HOSPITAL_BASED_OUTPATIENT_CLINIC_OR_DEPARTMENT_OTHER): Payer: Medicare Other | Attending: General Surgery

## 2012-09-02 DIAGNOSIS — L97809 Non-pressure chronic ulcer of other part of unspecified lower leg with unspecified severity: Secondary | ICD-10-CM | POA: Insufficient documentation

## 2012-09-02 DIAGNOSIS — I872 Venous insufficiency (chronic) (peripheral): Secondary | ICD-10-CM | POA: Insufficient documentation

## 2012-09-08 ENCOUNTER — Telehealth: Payer: Self-pay | Admitting: Family Medicine

## 2012-09-08 NOTE — Telephone Encounter (Signed)
Ms. [name], with [employer], calling to inform provider that Mr. [name] is requesting home health assistance.  I informed her that Mr. [name] hasn't been seen since July with Dr. Edmonia James.  Need to have an appt with new provider in order to get referral for home health aide.  Ms. [name] was agreeable to this plan and I will make the appt with Mr. [name].  If you need to contact Ms. [name] you can reach her at [phone]  Ext [phone]

## 2012-09-18 ENCOUNTER — Other Ambulatory Visit: Payer: Self-pay | Admitting: Family Medicine

## 2012-09-18 DIAGNOSIS — S81802A Unspecified open wound, left lower leg, initial encounter: Secondary | ICD-10-CM

## 2012-09-30 ENCOUNTER — Encounter (HOSPITAL_BASED_OUTPATIENT_CLINIC_OR_DEPARTMENT_OTHER): Payer: Medicare Other | Attending: General Surgery

## 2012-09-30 DIAGNOSIS — L97809 Non-pressure chronic ulcer of other part of unspecified lower leg with unspecified severity: Secondary | ICD-10-CM | POA: Insufficient documentation

## 2012-09-30 DIAGNOSIS — I872 Venous insufficiency (chronic) (peripheral): Secondary | ICD-10-CM | POA: Insufficient documentation

## 2012-10-05 ENCOUNTER — Ambulatory Visit (INDEPENDENT_AMBULATORY_CARE_PROVIDER_SITE_OTHER): Payer: Medicare Other | Admitting: Family Medicine

## 2012-10-05 ENCOUNTER — Encounter: Payer: Self-pay | Admitting: Family Medicine

## 2012-10-05 VITALS — BP 114/58 | HR 57

## 2012-10-05 DIAGNOSIS — M86669 Other chronic osteomyelitis, unspecified tibia and fibula: Secondary | ICD-10-CM

## 2012-10-05 DIAGNOSIS — Z452 Encounter for adjustment and management of vascular access device: Secondary | ICD-10-CM | POA: Insufficient documentation

## 2012-10-05 DIAGNOSIS — I10 Essential (primary) hypertension: Secondary | ICD-10-CM

## 2012-10-05 DIAGNOSIS — Z0000 Encounter for general adult medical examination without abnormal findings: Secondary | ICD-10-CM

## 2012-10-05 DIAGNOSIS — L97909 Non-pressure chronic ulcer of unspecified part of unspecified lower leg with unspecified severity: Secondary | ICD-10-CM

## 2012-10-05 DIAGNOSIS — E119 Type 2 diabetes mellitus without complications: Secondary | ICD-10-CM

## 2012-10-05 DIAGNOSIS — Z23 Encounter for immunization: Secondary | ICD-10-CM

## 2012-10-05 DIAGNOSIS — L97929 Non-pressure chronic ulcer of unspecified part of left lower leg with unspecified severity: Secondary | ICD-10-CM

## 2012-10-05 LAB — POCT GLYCOSYLATED HEMOGLOBIN (HGB A1C): Hemoglobin A1C: 7

## 2012-10-05 MED ORDER — ZOSTER VACCINE LIVE 19400 UNT/0.65ML ~~LOC~~ SOLR: 0.6500 mL | Freq: Once | SUBCUTANEOUS | Status: DC

## 2012-10-05 NOTE — Progress Notes (Signed)
  Subjective:    Patient ID: [name], male    DOB: Dec 29, 1941, 71 y.o.   MRN: [id number]  HPI  71 year old M with morbid obesity, chronic osteomyelitis of LLE, chronic venous ulcers of RLE, DM and HTN who presents for routine follow up.   1. Right leg osteomyelitis - recent I&D with antibiotic bead placement by Dr. Lajoyce Corners, orthopedist, on August 18th; He recently saw   2. Leg Leg Ulcer - Chronic 2-3 years, Seeing wound care center 1-2 x a week, improving to the approximate size of the quarter   3. Hypertension  Home BP monitoring:  BP Readings from Last 3 Encounters:  10/05/12 114/58  08/17/12 123/69  08/17/12 123/69    Prescribed meds: triamterene/HCTZ 37.5-25, metoprolol tartrate 100 mg daily, furosemide 40 mg daily   Hypertension ROS: taking medications as instructed, no medication side effects noted, no TIA's, no chest pain on exertion and no dyspnea on exertion, patient with chronic venous stasis ulcers and osteomyelitis with peripheral edema  4. Diabetes  Taking and tolerating: yes Lantus 70 units daily, 70 at night; Novolog 20 units breakfast, 10 units lunch, 20 units dinner Fasting blood sugars: 120-140  Hypoglycemic symptoms: yes - in the hospital it was low to 45 Diet Changes: none  Exercise: none Visual problems: uses reading glasses, has glaucoma, appointment with eye doctor on November 30 Monitoring feet: yes Numbness/Tingling: no Last eye exam:last November  Diabetic Labs:  Lab Results  Component Value Date   HGBA1C 7.0 10/05/2012   HGBA1C 8.5 05/19/2012   HGBA1C 9.1 02/25/2012   Lab Results  Component Value Date   LDLCALC 78 04/17/2011   CREATININE 1.18 08/13/2012   Last microalbumin: February 2013 - No protein in U/A  Taking an ACE-I ?:no, cannot tolerated ACE-I or ARB   Review of Systems Negative unless stated in the HPI     Objective:   Physical Exam BP 114/58  Pulse 57  SpO2 99% Gen: elderly obese AAM, pleasant and conversant, NAD CV:  2/6 systolic murmur, no carotid bruits Pulm: CTA-B LLE: una-boot in place RLE: 3 cm anterior wound with purulent drainage and good granulation tissue, no erythema or tenderness     Assessment & Plan:  71 year old M with well controlled type 2 DM and HTN.

## 2012-10-05 NOTE — Patient Instructions (Signed)
Dear [name],   Thank you for coming to clinic today. Please read below regarding the issues that we discussed.   1. Diabetes - Your A1C is 7.0, which is great. Continue taking your insulin and try to eat as healthy as possible.   2. Hypertension - Your blood pressure is really well controlled. Good job!  3. Vaccine - I will give you prescriptions to take to the pharmacy for shingles and tetanus vaccines.   Please follow up in clinic in 3 months. Please call earlier if you have any questions or concerns.   Sincerely,   Dr. Clinton Sawyer

## 2012-10-05 NOTE — Assessment & Plan Note (Signed)
Well controlled with home dose of Lantus 70 units BID and Novolog 20-10-20 units with meals daily. No changes. Patient will follow up with ophthalmologist next month. Otherwise, no new intervention.

## 2012-10-05 NOTE — Assessment & Plan Note (Signed)
Give Rx for TDap and Zostavax vaccines today. Also given flu vaccine and pneumococcus in the office.

## 2012-10-05 NOTE — Assessment & Plan Note (Signed)
Well controlled on triamterene/HCTZ, metoprolol, and furosemide. Continue with current doses.

## 2012-10-05 NOTE — Assessment & Plan Note (Signed)
Patient still taking doxycycline, Bactroban, and applying silvadene cream per Dr. Lajoyce Corners. No evidence of systemic infection. Has follow up with Dr. Lajoyce Corners already scheduled.

## 2012-10-05 NOTE — Assessment & Plan Note (Signed)
Started several years ago. Likely a stasis ulcer. Patient in una-boot with wound care management. Continue with wound care.

## 2012-10-09 ENCOUNTER — Telehealth: Payer: Self-pay | Admitting: Family Medicine

## 2012-10-09 DIAGNOSIS — Z8719 Personal history of other diseases of the digestive system: Secondary | ICD-10-CM

## 2012-10-09 MED ORDER — SENNA-DOCUSATE SODIUM 8.6-50 MG PO TABS: 1.0000 | ORAL_TABLET | Freq: Every day | ORAL | Status: DC | PRN

## 2012-10-09 NOTE — Telephone Encounter (Signed)
Will check with Dr. Clinton Sawyer and call patient back.  Gaylene Brooks, RN

## 2012-10-09 NOTE — Telephone Encounter (Signed)
Saw Dr Jani Gravel this week and though he was going to call in med for his constipation - checking to see if this has been done.  [facility] [employer]

## 2012-10-09 NOTE — Telephone Encounter (Signed)
Returned call to patient.  Informed that med sent to his pharmacy.  Gaylene Brooks, RN

## 2012-10-09 NOTE — Telephone Encounter (Signed)
Please let the patient know something was just sent. Thank you.

## 2012-10-12 ENCOUNTER — Other Ambulatory Visit: Payer: Self-pay | Admitting: Family Medicine

## 2012-10-19 ENCOUNTER — Other Ambulatory Visit: Payer: Self-pay | Admitting: Family Medicine

## 2012-10-19 ENCOUNTER — Other Ambulatory Visit: Payer: Self-pay | Admitting: Sports Medicine

## 2012-10-19 MED ORDER — FUROSEMIDE 40 MG PO TABS: 40.0000 mg | ORAL_TABLET | Freq: Two times a day (BID) | ORAL | Status: DC

## 2012-10-20 NOTE — Telephone Encounter (Signed)
Just refilled  On 10/12/12.

## 2012-10-26 ENCOUNTER — Telehealth: Payer: Self-pay | Admitting: Family Medicine

## 2012-10-26 ENCOUNTER — Telehealth: Payer: Self-pay | Admitting: *Deleted

## 2012-10-26 DIAGNOSIS — M8660 Other chronic osteomyelitis, unspecified site: Secondary | ICD-10-CM

## 2012-10-26 NOTE — Telephone Encounter (Signed)
Nurse from [employer] spoke to the patient this morning and the patient is requesting an order for a Certified Nursing Assistance to help him with household duties.

## 2012-10-26 NOTE — Telephone Encounter (Signed)
Message copied by Jennette Bill on Mon Oct 26, 2012  1:56 PM ------      Message from: Henderson Newcomer      Created: Mon Oct 26, 2012 12:41 PM      Regarding: RE: [facility]       Rozanna Boer. Unfortunately, we can't provide just an aide for assistance with ADLs.  We have to have a skilled need.  If the pt just needs an aide, he could check with a company like [employer] or [employer] that provides personal care services.  Hope this helps.      ----- Message -----         From: Dorice Lamas, CMA         Sent: 10/26/2012  12:12 PM           To: Melissa Stenson      Subject: HH                                                       Please see referral for Home Health for Mr Marquett Bertoli, please let me know if there are any questions.            Thank You

## 2012-10-26 NOTE — Telephone Encounter (Signed)
Will fwd. To Dr.Williamson for review. .[name]  

## 2012-10-26 NOTE — Telephone Encounter (Signed)
Called patient and gave him number to both health agencies suggested by [facility] for him to call and get set up with assisted daily living needs. Told patient to call back if they need any additional information from [place] or if he needs help with anything else.[place], Rodena Medin

## 2012-10-26 NOTE — Telephone Encounter (Signed)
I called Jasmin at [employer]. She noted that Mr. Schreur asked for assistance in his home completing ADL's since he is disabled. I will put in an order for assistance.

## 2012-11-04 ENCOUNTER — Encounter (HOSPITAL_BASED_OUTPATIENT_CLINIC_OR_DEPARTMENT_OTHER): Payer: Medicare Other | Attending: General Surgery

## 2012-11-04 ENCOUNTER — Encounter (HOSPITAL_BASED_OUTPATIENT_CLINIC_OR_DEPARTMENT_OTHER): Payer: Medicare Other

## 2012-11-04 DIAGNOSIS — I872 Venous insufficiency (chronic) (peripheral): Secondary | ICD-10-CM | POA: Insufficient documentation

## 2012-11-04 DIAGNOSIS — L97809 Non-pressure chronic ulcer of other part of unspecified lower leg with unspecified severity: Secondary | ICD-10-CM | POA: Insufficient documentation

## 2012-11-04 DIAGNOSIS — E669 Obesity, unspecified: Secondary | ICD-10-CM | POA: Insufficient documentation

## 2012-11-10 ENCOUNTER — Emergency Department (HOSPITAL_COMMUNITY)
Admission: EM | Admit: 2012-11-10 | Discharge: 2012-11-11 | Disposition: A | Discharge status: Home / Self Care | Payer: Medicare Other | Attending: Emergency Medicine | Admitting: Emergency Medicine

## 2012-11-10 ENCOUNTER — Emergency Department (HOSPITAL_COMMUNITY): Payer: Medicare Other

## 2012-11-10 DIAGNOSIS — IMO0002 Reserved for concepts with insufficient information to code with codable children: Secondary | ICD-10-CM | POA: Insufficient documentation

## 2012-11-10 DIAGNOSIS — M889 Osteitis deformans of unspecified bone: Secondary | ICD-10-CM | POA: Insufficient documentation

## 2012-11-10 DIAGNOSIS — Z7982 Long term (current) use of aspirin: Secondary | ICD-10-CM | POA: Insufficient documentation

## 2012-11-10 DIAGNOSIS — R109 Unspecified abdominal pain: Secondary | ICD-10-CM

## 2012-11-10 DIAGNOSIS — Z794 Long term (current) use of insulin: Secondary | ICD-10-CM | POA: Insufficient documentation

## 2012-11-10 DIAGNOSIS — Z79899 Other long term (current) drug therapy: Secondary | ICD-10-CM | POA: Insufficient documentation

## 2012-11-10 DIAGNOSIS — M545 Low back pain, unspecified: Secondary | ICD-10-CM | POA: Insufficient documentation

## 2012-11-10 DIAGNOSIS — Z8719 Personal history of other diseases of the digestive system: Secondary | ICD-10-CM | POA: Insufficient documentation

## 2012-11-10 DIAGNOSIS — M549 Dorsalgia, unspecified: Secondary | ICD-10-CM

## 2012-11-10 DIAGNOSIS — M1990 Unspecified osteoarthritis, unspecified site: Secondary | ICD-10-CM | POA: Insufficient documentation

## 2012-11-10 DIAGNOSIS — Z860100 Personal history of colon polyps, unspecified: Secondary | ICD-10-CM | POA: Insufficient documentation

## 2012-11-10 DIAGNOSIS — K219 Gastro-esophageal reflux disease without esophagitis: Secondary | ICD-10-CM | POA: Insufficient documentation

## 2012-11-10 DIAGNOSIS — E119 Type 2 diabetes mellitus without complications: Secondary | ICD-10-CM | POA: Insufficient documentation

## 2012-11-10 DIAGNOSIS — C61 Malignant neoplasm of prostate: Secondary | ICD-10-CM | POA: Insufficient documentation

## 2012-11-10 DIAGNOSIS — I872 Venous insufficiency (chronic) (peripheral): Secondary | ICD-10-CM | POA: Insufficient documentation

## 2012-11-10 DIAGNOSIS — I10 Essential (primary) hypertension: Secondary | ICD-10-CM | POA: Insufficient documentation

## 2012-11-10 DIAGNOSIS — R112 Nausea with vomiting, unspecified: Secondary | ICD-10-CM | POA: Insufficient documentation

## 2012-11-10 LAB — URINALYSIS, ROUTINE W REFLEX MICROSCOPIC
Bilirubin Urine: NEGATIVE
Nitrite: NEGATIVE
Specific Gravity, Urine: 1.012 (ref 1.005–1.030)
Urobilinogen, UA: 1 mg/dL (ref 0.0–1.0)
pH: 5 (ref 5.0–8.0)

## 2012-11-10 LAB — URINE MICROSCOPIC-ADD ON

## 2012-11-10 MED ORDER — SODIUM CHLORIDE 0.9 % IV SOLN
INTRAVENOUS | Status: DC
  Administered 2012-11-10: via INTRAVENOUS

## 2012-11-10 MED ORDER — MORPHINE SULFATE 4 MG/ML IJ SOLN
4.0000 mg | Freq: Once | INTRAMUSCULAR | Status: AC
  Administered 2012-11-10: 4 mg via INTRAVENOUS
  Filled 2012-11-10: qty 1

## 2012-11-10 MED ORDER — ONDANSETRON HCL 4 MG/2ML IJ SOLN
4.0000 mg | Freq: Once | INTRAMUSCULAR | Status: AC
  Administered 2012-11-10: 4 mg via INTRAVENOUS
  Filled 2012-11-10: qty 2

## 2012-11-10 NOTE — ED Notes (Signed)
Pt states that he has had lower back pain w/o trauma for 6hrs. Takes tramadol for pain. States that he took it today and threw it up.

## 2012-11-10 NOTE — ED Notes (Signed)
Bed:WHALA<BR> Expected date:11/10/12<BR> Expected time: 7:38 PM<BR> Means of arrival:Ambulance<BR> Comments:<BR> Lower back pain; elderly

## 2012-11-11 ENCOUNTER — Emergency Department (HOSPITAL_COMMUNITY): Payer: Medicare Other

## 2012-11-11 LAB — CBC WITH DIFFERENTIAL/PLATELET
Eosinophils Absolute: 0.3 10*3/uL (ref 0.0–0.7)
Eosinophils Relative: 3 % (ref 0–5)
HCT: 33.8 % — ABNORMAL LOW (ref 39.0–52.0)
Hemoglobin: 10.8 g/dL — ABNORMAL LOW (ref 13.0–17.0)
Lymphs Abs: 1.8 10*3/uL (ref 0.7–4.0)
MCH: 25.5 pg — ABNORMAL LOW (ref 26.0–34.0)
MCHC: 32 g/dL (ref 30.0–36.0)
MCV: 79.9 fL (ref 78.0–100.0)
Monocytes Absolute: 0.8 10*3/uL (ref 0.1–1.0)
Monocytes Relative: 7 % (ref 3–12)
RBC: 4.23 MIL/uL (ref 4.22–5.81)

## 2012-11-11 LAB — COMPREHENSIVE METABOLIC PANEL
Alkaline Phosphatase: 146 U/L — ABNORMAL HIGH (ref 39–117)
BUN: 19 mg/dL (ref 6–23)
Calcium: 9 mg/dL (ref 8.4–10.5)
Creatinine, Ser: 0.9 mg/dL (ref 0.50–1.35)
GFR calc Af Amer: >90 mL/min (ref >90)
Glucose, Bld: 172 mg/dL — ABNORMAL HIGH (ref 70–99)
Potassium: 3.7 mEq/L (ref 3.5–5.1)
Total Protein: 8.3 g/dL (ref 6.0–8.3)

## 2012-11-11 MED ORDER — HYDROCODONE-ACETAMINOPHEN 5-325 MG PO TABS: ORAL_TABLET | ORAL | Status: DC

## 2012-11-11 MED ORDER — IOHEXOL 300 MG/ML  SOLN
100.0000 mL | Freq: Once | INTRAMUSCULAR | Status: AC | PRN
  Administered 2012-11-11: 100 mL via INTRAVENOUS

## 2012-11-11 MED ORDER — CYCLOBENZAPRINE HCL 10 MG PO TABS: 10.0000 mg | ORAL_TABLET | Freq: Three times a day (TID) | ORAL | Status: DC | PRN

## 2012-11-11 NOTE — ED Provider Notes (Signed)
History     CSN: [id number]  Arrival date & time 11/10/12  1956   First MD Initiated Contact with Patient 11/10/12 2300      Chief Complaint  Patient presents with  . Back Pain    (Consider location/radiation/quality/duration/timing/severity/associated sxs/prior treatment) HPI  Patient reports low back pain that radiates around to his bilateral lower abdomen that started approximately 6 hours ago. He states the pain is sharp and has been constant. He states nothing makes it feel worse and nothing makes it feel better. He has had nausea with vomiting at home. He denies diarrhea, urinary difficulty, hematuria. He denies cough or shortness of breath. He states he's never had this pain before. He denies any change in activity or known injury.  PCP Dr. Clinton Sawyer at [facility] family practice  Past Medical History  Diagnosis Date  . COLON POLYP 02/26/2007    Qualifier: Diagnosis of  By: Sandria Manly  MD, MELISSA    . HIATAL HERNIA WITH REFLUX 09/29/2006    Qualifier: Diagnosis of  By: Sandria Manly  MD, MELISSA    . HIP REPLACEMENT, BILATERAL, HX OF 05/12/2007    Qualifier: Diagnosis of  By: Sandria Manly  MD, MELISSA    . OSTEOARTHRITIS 03/05/2007    Qualifier: Diagnosis of  By: Sandria Manly  MD, MELISSA    . VENOUS INSUFFICIENCY, CHRONIC 02/26/2007    Qualifier: Diagnosis of  By: Sandria Manly  MD, MELISSA    . Diabetes mellitus   . Hypertension   . Heart murmur     last 2D Echo -03/31/08  . Complication of anesthesia   . PONV (postoperative nausea and vomiting)   . Prostate cancer   . GERD (gastroesophageal reflux disease)   . History of blood transfusion     Past Surgical History  Procedure Date  . Hip arthroplasty     bil  . Leg wound repair / closure     Beads .  Skin wound  . Skin graft     right leg- from donor skin  . Skin graft 1976    R wrist   skin graft from left thigh  . Wrist fusion     right wrist  . Colonoscopy   . Upper gastrointestinal endoscopy   . Leg surgery     ROD for fracture  . Wisdom  tooth extraction   . I&d extremity 08/14/2012    Procedure: IRRIGATION AND DEBRIDEMENT EXTREMITY;  Surgeon: Nadara Mustard, MD;  Location: [facility] OR;  Service: Orthopedics;  Laterality: Right;  Irrigation and Debridement Right tibia, Placement antibiotic beads    No family history on file.  History  Substance Use Topics  . Smoking status: Never Smoker   . Smokeless tobacco: Never Used  . Alcohol Use: No   Lives at home Lives alone Uses crutches and a scooter  Review of Systems  All other systems reviewed and are negative.    Allergies  Ace inhibitors; Angiotensin receptor blockers; Lisinopril; and Metformin  Home Medications   Current Outpatient Rx  Name  Route  Sig  Dispense  Refill  . ALBUTEROL SULFATE HFA 108 (90 BASE) MCG/ACT IN AERS   Inhalation   Inhale 2 puffs into the lungs every 4 (four) hours as needed. For wheezing and shortness of breath         . ASPIRIN 81 MG PO CHEW   Oral   Chew 81 mg by mouth daily.          Marland Kitchen DOXYCYCLINE HYCLATE 100 MG PO  TABS   Oral   Take 100 mg by mouth 2 (two) times daily.         Marland Kitchen ESOMEPRAZOLE MAGNESIUM 40 MG PO CPDR   Oral   Take 40 mg by mouth 2 (two) times daily.         Marland Kitchen FLUTICASONE PROPIONATE 0.05 % EX CREA      APPLY TO AFFECTED AREA TWICE DAILY   60 g   PRN   . FLUTICASONE PROPIONATE  HFA 220 MCG/ACT IN AERO   Inhalation   Inhale 1 puff into the lungs 2 (two) times daily.         . FUROSEMIDE 40 MG PO TABS   Oral   Take 1 tablet (40 mg total) by mouth 2 (two) times daily.   60 tablet   0   . INSULIN ASPART 100 UNIT/ML [place] SOLN   Subcutaneous   Inject 10-20 Units into the skin 2 (two) times daily before lunch and supper. Take 20 units in the morning Take 10 units at lunch         . INSULIN GLARGINE 100 UNIT/ML [place] SOLN   Subcutaneous   Inject 70 Units into the skin 2 (two) times daily.         Marland Kitchen LATANOPROST 0.005 % OP SOLN   Both Eyes   Place 1 drop into both eyes at bedtime.         Marland Kitchen  METOPROLOL TARTRATE 100 MG PO TABS   Oral   Take 100 mg by mouth 2 (two) times daily.         . MOMETASONE FUROATE 50 MCG/ACT NA SUSP   Nasal   Place 1 spray into the nose 2 (two) times daily. Both nostrils         . PRAVASTATIN SODIUM 20 MG PO TABS   Oral   Take 20 mg by mouth 2 (two) times daily.         . SENNA-DOCUSATE SODIUM 8.6-50 MG PO TABS   Oral   Take 1 tablet by mouth daily as needed for constipation.   60 tablet   2   . SILVER SULFADIAZINE 1 % EX CREA   Topical   Apply 1 application topically daily. Apply to right leg daily         . TAMSULOSIN HCL 0.4 MG PO CAPS   Oral   Take 0.4 mg by mouth 2 (two) times daily.         . TRAMADOL HCL 50 MG PO TABS   Oral   Take 50 mg by mouth every 6 (six) hours as needed. For pain         . TRIAMTERENE-HCTZ 37.5-25 MG PO CAPS   Oral   Take 1 capsule by mouth 2 (two) times daily.           BP 130/62  Pulse 98  Temp 98.1 F (36.7 C) (Oral)  Resp 20  SpO2 96%  Vital signs normal    Physical Exam  Nursing note and vitals reviewed. Constitutional: He is oriented to person, place, and time. He appears well-developed and well-nourished.  Non-toxic appearance. He does not appear ill. No distress.       obese  HENT:  Head: Normocephalic and atraumatic.  Right Ear: External ear normal.  Left Ear: External ear normal.  Nose: Nose normal. No mucosal edema or rhinorrhea.  Mouth/Throat: Oropharynx is clear and moist and mucous membranes are normal. No dental abscesses or uvula swelling.  Eyes: Conjunctivae normal and EOM are normal. Pupils are equal, round, and reactive to light.  Neck: Normal range of motion and full passive range of motion without pain. Neck supple.  Cardiovascular: Normal rate, regular rhythm and normal heart sounds.  Exam reveals no gallop and no friction rub.   No murmur heard. Pulmonary/Chest: Effort normal and breath sounds normal. No respiratory distress. He has no wheezes. He has no  rhonchi. He has no rales. He exhibits no tenderness and no crepitus.  Abdominal: Soft. Normal appearance and bowel sounds are normal. He exhibits no distension. There is tenderness. There is no rebound and no guarding.    Musculoskeletal: Normal range of motion. He exhibits no edema and no tenderness.       Arms:      Moves all extremities well.   Neurological: He is alert and oriented to person, place, and time. He has normal strength. No cranial nerve deficit.  Skin: Skin is warm, dry and intact. No rash noted. No erythema. No pallor.  Psychiatric: He has a normal mood and affect. His speech is normal and behavior is normal. His mood appears not anxious.    ED Course  Procedures (including critical care time)   Medications  0.9 %  sodium chloride infusion (  Intravenous New Bag/Given 11/10/12 2352)  HYDROcodone-acetaminophen (NORCO/VICODIN) 5-325 MG per tablet (not administered)  cyclobenzaprine (FLEXERIL) 10 MG tablet (not administered)  morphine 4 MG/ML injection 4 mg (4 mg Intravenous Given 11/10/12 2352)  ondansetron (ZOFRAN) injection 4 mg (4 mg Intravenous Given 11/10/12 2352)  iohexol (OMNIPAQUE) 300 MG/ML solution 100 mL (100 mL Intravenous Contrast Given 11/11/12 0149)   Discussed his tests results.  Pain is better.   Results for orders placed during the hospital encounter of 11/10/12  URINALYSIS, ROUTINE W REFLEX MICROSCOPIC      Component Value Range   Color, Urine YELLOW  YELLOW   APPearance CLEAR  CLEAR   Specific Gravity, Urine 1.012  1.005 - 1.030   pH 5.0  5.0 - 8.0   Glucose, UA NEGATIVE  NEGATIVE mg/dL   Hgb urine dipstick TRACE (*) NEGATIVE   Bilirubin Urine NEGATIVE  NEGATIVE   Ketones, ur NEGATIVE  NEGATIVE mg/dL   Protein, ur NEGATIVE  NEGATIVE mg/dL   Urobilinogen, UA 1.0  0.0 - 1.0 mg/dL   Nitrite NEGATIVE  NEGATIVE   Leukocytes, UA SMALL (*) NEGATIVE  URINE MICROSCOPIC-ADD ON      Component Value Range   WBC, UA 0-2  <3 WBC/hpf   RBC / HPF 0-2   <3 RBC/hpf   Bacteria, UA FEW (*) RARE   Urine-Other MUCOUS PRESENT    CBC WITH DIFFERENTIAL      Component Value Range   WBC 11.7 (*) 4.0 - 10.5 K/uL   RBC 4.23  4.22 - 5.81 MIL/uL   Hemoglobin 10.8 (*) 13.0 - 17.0 g/dL   HCT 62.1 (*) 30.8 - 65.7 %   MCV 79.9  78.0 - 100.0 fL   MCH 25.5 (*) 26.0 - 34.0 pg   MCHC 32.0  30.0 - 36.0 g/dL   RDW 84.6  96.2 - 95.2 %   Platelets 310  150 - 400 K/uL   Neutrophils Relative 74  43 - 77 %   Neutro Abs 8.7 (*) 1.7 - 7.7 K/uL   Lymphocytes Relative 16  12 - 46 %   Lymphs Abs 1.8  0.7 - 4.0 K/uL   Monocytes Relative 7  3 - 12 %  Monocytes Absolute 0.8  0.1 - 1.0 K/uL   Eosinophils Relative 3  0 - 5 %   Eosinophils Absolute 0.3  0.0 - 0.7 K/uL   Basophils Relative 0  0 - 1 %   Basophils Absolute 0.0  0.0 - 0.1 K/uL  COMPREHENSIVE METABOLIC PANEL      Component Value Range   Sodium 135  135 - 145 mEq/L   Potassium 3.7  3.5 - 5.1 mEq/L   Chloride 97  96 - 112 mEq/L   CO2 28  19 - 32 mEq/L   Glucose, Bld 172 (*) 70 - 99 mg/dL   BUN 19  6 - 23 mg/dL   Creatinine, Ser 1.61  0.50 - 1.35 mg/dL   Calcium 9.0  8.4 - 09.6 mg/dL   Total Protein 8.3  6.0 - 8.3 g/dL   Albumin 2.9 (*) 3.5 - 5.2 g/dL   AST 12  0 - 37 U/L   ALT 7  0 - 53 U/L   Alkaline Phosphatase 146 (*) 39 - 117 U/L   Total Bilirubin 0.3  0.3 - 1.2 mg/dL   GFR calc non Af Amer 83 (*) >90 mL/min   GFR calc Af Amer >90  >90 mL/min   Laboratory interpretation all normal except mild leukocytosis, hyperglycemia, elevated alk phosphatase        Ct Lumbar Spine Wo Contrast  11/10/2012  *RADIOLOGY REPORT*  Clinical Data: Low back pain.  No known injury.  CT LUMBAR SPINE WITHOUT CONTRAST  Technique:  Multidetector CT imaging of the lumbar spine was performed without intravenous contrast administration. Multiplanar CT image reconstructions were also generated.  Comparison: CT of the abdomen pelvis 06/03/2008  Findings: There is normal alignment.  No fracture.  No visible disc  herniation.  Degenerative facet disease noted at L4-5 and L5-S1.  There is trabecular thickening and sclerosis noted throughout the left hemi pelvis, similar to 2009.  This is most compatible with Paget's disease.  IMPRESSION: No acute bony abnormality.  Degenerative facet disease in the lower lumbar spine.  Sclerosis and cortical/trabecular thickening throughout the left hemi pelvis, stable since 2009, most compatible with Paget's disease.   Original Report Authenticated By: Charlett Nose, M.D.    Ct Abdomen Pelvis W Contrast  11/11/2012  *RADIOLOGY REPORT*  Clinical Data: Abdominal pain, back pain.  CT ABDOMEN AND PELVIS WITH CONTRAST  Technique:  Multidetector CT imaging of the abdomen and pelvis was performed following the standard protocol during bolus administration of intravenous contrast.  Contrast: OMNIPAQUE IOHEXOL 300 MG/ML  SOLN  Comparison: Lumbar spine CT performed 11/10/2012.  CT abdomen and pelvis 06/03/2008.  Findings: Lung bases are clear.  No effusions.  Heart is normal size.  Gallstones are noted within the gallbladder.  Liver, spleen, pancreas, adrenals and kidneys are unremarkable.  No renal or ureteral stones.  No hydronephrosis.  Small umbilical hernia containing fat.  Bilateral hip replacements are noted and cause beam hardening artifact in the lower pelvis, obscuring lower pelvic structures. Visualized large and small bowel are grossly unremarkable.  No free fluid, free air or adenopathy.  Aorta is normal caliber.  Diffuse sclerosis with cortical and trabecular thickening throughout the left hemi pelvis, most likely Paget's disease.  No acute bony abnormality.  IMPRESSION: No acute findings in the abdomen or pelvis.  Cholelithiasis.  Findings most suggestive of Paget's disease in the left hemi pelvis.  This is unchanged.   Original Report Authenticated By: Charlett Nose, M.D.     1.  Back pain   2. Abdominal pain   3. Paget's disease of bone     New Prescriptions    CYCLOBENZAPRINE (FLEXERIL) 10 MG TABLET    Take 1 tablet (10 mg total) by mouth 3 (three) times daily as needed (muscle pain).   HYDROCODONE-ACETAMINOPHEN (NORCO/VICODIN) 5-325 MG PER TABLET    Take 1 or 2 po Q 6hrs for pain    Plan discharge  Devoria Albe, MD, FACEP   MDM          Ward Givens, MD 11/11/12 [phone]

## 2012-12-02 ENCOUNTER — Encounter (HOSPITAL_BASED_OUTPATIENT_CLINIC_OR_DEPARTMENT_OTHER): Payer: Medicare Other | Attending: General Surgery

## 2012-12-02 DIAGNOSIS — L97909 Non-pressure chronic ulcer of unspecified part of unspecified lower leg with unspecified severity: Secondary | ICD-10-CM | POA: Insufficient documentation

## 2012-12-14 ENCOUNTER — Other Ambulatory Visit: Payer: Self-pay | Admitting: Family Medicine

## 2013-01-06 ENCOUNTER — Encounter (HOSPITAL_BASED_OUTPATIENT_CLINIC_OR_DEPARTMENT_OTHER): Payer: Medicare Other | Attending: General Surgery

## 2013-01-06 DIAGNOSIS — L97909 Non-pressure chronic ulcer of unspecified part of unspecified lower leg with unspecified severity: Secondary | ICD-10-CM | POA: Insufficient documentation

## 2013-02-01 ENCOUNTER — Other Ambulatory Visit: Payer: Self-pay | Admitting: Family Medicine

## 2013-02-03 ENCOUNTER — Encounter (HOSPITAL_BASED_OUTPATIENT_CLINIC_OR_DEPARTMENT_OTHER): Payer: Medicare Other | Attending: General Surgery

## 2013-02-03 DIAGNOSIS — I872 Venous insufficiency (chronic) (peripheral): Secondary | ICD-10-CM | POA: Insufficient documentation

## 2013-02-03 DIAGNOSIS — L97909 Non-pressure chronic ulcer of unspecified part of unspecified lower leg with unspecified severity: Secondary | ICD-10-CM | POA: Insufficient documentation

## 2013-02-10 ENCOUNTER — Encounter (HOSPITAL_BASED_OUTPATIENT_CLINIC_OR_DEPARTMENT_OTHER): Payer: Medicare Other

## 2013-03-03 ENCOUNTER — Encounter (HOSPITAL_BASED_OUTPATIENT_CLINIC_OR_DEPARTMENT_OTHER): Payer: Medicare Other | Attending: General Surgery

## 2013-03-03 DIAGNOSIS — L97909 Non-pressure chronic ulcer of unspecified part of unspecified lower leg with unspecified severity: Secondary | ICD-10-CM | POA: Insufficient documentation

## 2013-03-05 ENCOUNTER — Ambulatory Visit: Payer: Medicare Other | Admitting: Family Medicine

## 2013-03-17 ENCOUNTER — Ambulatory Visit (INDEPENDENT_AMBULATORY_CARE_PROVIDER_SITE_OTHER): Payer: Medicare Other | Admitting: Family Medicine

## 2013-03-17 ENCOUNTER — Encounter: Payer: Self-pay | Admitting: Family Medicine

## 2013-03-17 VITALS — BP 122/75 | HR 79 | Ht 72.0 in | Wt >= 6400 oz

## 2013-03-17 DIAGNOSIS — M86669 Other chronic osteomyelitis, unspecified tibia and fibula: Secondary | ICD-10-CM

## 2013-03-17 DIAGNOSIS — L97929 Non-pressure chronic ulcer of unspecified part of left lower leg with unspecified severity: Secondary | ICD-10-CM

## 2013-03-17 DIAGNOSIS — L97909 Non-pressure chronic ulcer of unspecified part of unspecified lower leg with unspecified severity: Secondary | ICD-10-CM

## 2013-03-17 LAB — POCT GLYCOSYLATED HEMOGLOBIN (HGB A1C): Hemoglobin A1C: 8.3

## 2013-03-17 NOTE — Progress Notes (Signed)
  Subjective:    Patient ID: ASCENCION COYE, male    DOB: 1941-06-20, 72 y.o.   MRN: [id number]  HPI  72 year old F with DM type 2 who presents for evaluation.   Diabetes  Taking and tolerating: yes - insulin novolog - 10U with meals , insulin lantus - 70U BID Fasting blood sugars:150-160, checking each morning  Hypoglycemic symptoms: no Diet Changes: receives meals on wheels for 1 month and gets diabetic meals Exercise: minimally ambulatory given right foot osteomyelitis, knee osteo-athritis, and left leg ulcer;  Visual problems: no - wears reading glasses Last eye exam: has appointment coming up with Dr. Mitzi Davenport in March Monitoring feet: yes - right foot healing ulcer Numbness/Tingling: has advanced neuropathy Dentist Visit: last visit several years ago Taking an ACE-I ?: no, has allergy  Diabetic Labs:  Lab Results  Component Value Date   HGBA1C 8.3 03/17/2013   HGBA1C 7.0 10/05/2012   HGBA1C 8.5 05/19/2012   Lab Results  Component Value Date   LDLCALC 78 04/17/2011   CREATININE 0.90 11/10/2012   Last microalbumin: No results found for this basename: MICROALBUR,  [id number]       Review of Systems Denies chest pain or SOB       Objective:   Physical Exam Gen: very obese AAM, chronically ill appearing, pleasant and conversant, confined to motorized wheelchair  CV: distant heart sounds Pulm: CTA-B LE: RLE anterior shin wound 0.5 x 0.25 cm with good granulation tissue with serous drainage, no surrounding erythema; LLE in UNA boot     Assessment & Plan:  72 year old M with morbid obesity and poorly controlled DM type 2 with chronic slowly healing LE ulcers.

## 2013-03-17 NOTE — Patient Instructions (Addendum)
Dear Mr. Weigold,   Thank you for coming to clinic today. Please read below regarding the issues that we discussed.   1. Diabetes - Please increase your insulin regimen to  75 units in the morning and 75 units at night. However, I want you to start at 71 units tonight and go up by one each night until your reach 75. If your morning blood sugar in less than 90, don't increase your Lantus anymore.   2. Leg Wound - Please call [facility] about enrolling the sock study to see if you are a good candidate.   Please follow up in clinic in 2 months. Please call earlier if you have any questions or concerns.   Sincerely,   Dr. Clinton Sawyer

## 2013-03-18 NOTE — Assessment & Plan Note (Signed)
Patient currently in [place] boot, but considering other alternatives to treatment at the wound care center. Patient was encouraged to contact [facility] [facility] to enroll in [place]. He is agreeable to this.

## 2013-03-18 NOTE — Assessment & Plan Note (Signed)
Patient still being followed by Dr. Lajoyce Corners and still using bactroban cream. No concern for infection or necrosis at this time.

## 2013-03-18 NOTE — Assessment & Plan Note (Signed)
Worsening control since last check. Uncertain cause as no active infection noted. A1C is 8.3. Therefore, increase Lantus to 75 units a day. [name] will need increase of short acting medication.

## 2013-03-30 ENCOUNTER — Other Ambulatory Visit: Payer: Self-pay | Admitting: Family Medicine

## 2013-04-29 ENCOUNTER — Other Ambulatory Visit: Payer: Self-pay | Admitting: Family Medicine

## 2013-05-04 ENCOUNTER — Other Ambulatory Visit: Payer: Self-pay | Admitting: *Deleted

## 2013-05-04 MED ORDER — FUROSEMIDE 40 MG PO TABS: 40.0000 mg | ORAL_TABLET | Freq: Two times a day (BID) | ORAL | Status: DC

## 2013-05-13 ENCOUNTER — Ambulatory Visit (INDEPENDENT_AMBULATORY_CARE_PROVIDER_SITE_OTHER): Payer: Medicare Other | Admitting: Family Medicine

## 2013-05-13 ENCOUNTER — Encounter: Payer: Self-pay | Admitting: Family Medicine

## 2013-05-13 VITALS — BP 145/80 | HR 76 | Temp 98.3°F

## 2013-05-13 DIAGNOSIS — R011 Cardiac murmur, unspecified: Secondary | ICD-10-CM

## 2013-05-13 DIAGNOSIS — I10 Essential (primary) hypertension: Secondary | ICD-10-CM

## 2013-05-13 DIAGNOSIS — L97909 Non-pressure chronic ulcer of unspecified part of unspecified lower leg with unspecified severity: Secondary | ICD-10-CM

## 2013-05-13 DIAGNOSIS — L97929 Non-pressure chronic ulcer of unspecified part of left lower leg with unspecified severity: Secondary | ICD-10-CM

## 2013-05-13 NOTE — Progress Notes (Signed)
  Subjective:    Patient ID: [name], male    DOB: 05-09-1941, 72 y.o.   MRN: [id number]  HPI  72 year old M with DM, obesity, chronic osteomyelitis who presents for follow up.   Hypertension  Home BP monitoring:  None   Office BP: BP Readings from Last 3 Encounters:  05/13/13 145/80  03/17/13 122/75  11/10/12 130/62    Prescribed meds: metoprolol 100 mg BID, Furosemide 40 mg BID, triamterene-HCTZ 37.5-25 mg daily  (not on ACE-I 2/2 cough and stopped ARB 2/2 weight gain)  Hypertension ROS:  Taking medications as prescribed:No Chest pain: No Shortness of breath: No Swelling of extremities: No TIA symptoms: No    Review of Systems See HPI      Objective:   Physical Exam  BP 145/80  Pulse 76  Temp(Src) 98.3 F (36.8 C) (Oral) Gen: elderly AAM, morbidly obese, confined to riding motorized wheelchair, non ill appearing, pleasant and conversant CV: 3/6 systolic crescendo-decrescendo ejection murmur Lungs: clear to auscultation in upper lung fields LE:  - LLE in stocking - RLE - anterior shin defect 3 cm long, no drainage or surrounding erythema, granulation tissue in wound      Assessment & Plan:

## 2013-05-13 NOTE — Patient Instructions (Addendum)
Dear [name],   Thank you for coming to clinic today. Please read below regarding the issues that we discussed.   1. Diabetes - Please continue your Lantus 75 at twice day. We will check your A1C in 5-6 weeks.   2. Blood Pressure - Continue taking the metoprolol and dyazide.   Please follow up in clinic in 5 weeks. Please call earlier if you have any questions or concerns.   Sincerely,   Dr. Clinton Sawyer

## 2013-05-22 NOTE — Assessment & Plan Note (Signed)
Well controlled. Continue current meds. Readdress need for ARB given diabetes and lack of clear contraindication since weight gain not likely due to ARB.

## 2013-05-22 NOTE — Assessment & Plan Note (Signed)
Sound consistent with aortic stenosis, but this was not noted on last echo in 2009. Currently asymptomatic, so will continue to observe.

## 2013-05-22 NOTE — Assessment & Plan Note (Signed)
Patient currently under care of orthopedic surgeon Dr. Lajoyce Corners for care of LLE ulcers. Is enrolled in study looking at effects of new stockings. Reports improvement.

## 2013-06-01 ENCOUNTER — Other Ambulatory Visit: Payer: Self-pay | Admitting: Family Medicine

## 2013-06-01 NOTE — Telephone Encounter (Signed)
Requested Prescriptions   Pending Prescriptions Disp Refills  . LANTUS SOLOSTAR 100 UNIT/ML SOPN [Pharmacy Med Name: LANTUS SOLOSTAR 100 U/ML INJ]      Sig: INJECT 65 UNITS SUBCUTANEOUSLY TWICE DAILY

## 2013-06-03 ENCOUNTER — Telehealth: Payer: Self-pay | Admitting: *Deleted

## 2013-06-03 MED ORDER — INSULIN GLARGINE 100 UNIT/ML SOLOSTAR PEN: 75.0000 [IU] | PEN_INJECTOR | Freq: Two times a day (BID) | SUBCUTANEOUS | Status: DC

## 2013-06-03 NOTE — Telephone Encounter (Signed)
Received call from [facility] asking for clarification on directions for Lantus Solostar.  [name] had previously been on Lantus 65 units two times a day but the new prescription they received has a dose of 75 units daily.  Per Dr. Yetta Numbers last office visit, [name] should be on Lantus 75 units two times a day.  New Rx sent to [facility].  [name]

## 2013-06-04 ENCOUNTER — Other Ambulatory Visit: Payer: Self-pay | Admitting: Family Medicine

## 2013-06-09 ENCOUNTER — Other Ambulatory Visit (HOSPITAL_COMMUNITY): Payer: Self-pay | Admitting: Orthopedic Surgery

## 2013-06-09 ENCOUNTER — Ambulatory Visit (HOSPITAL_COMMUNITY)
Admission: RE | Admit: 2013-06-09 | Discharge: 2013-06-09 | Disposition: A | Discharge status: Home / Self Care | Payer: Medicare Other | Source: Ambulatory Visit | Attending: Cardiovascular Disease | Admitting: Cardiovascular Disease

## 2013-06-09 ENCOUNTER — Encounter (HOSPITAL_BASED_OUTPATIENT_CLINIC_OR_DEPARTMENT_OTHER): Payer: Medicare Other | Attending: General Surgery

## 2013-06-09 DIAGNOSIS — M7989 Other specified soft tissue disorders: Secondary | ICD-10-CM | POA: Insufficient documentation

## 2013-06-09 DIAGNOSIS — Z48812 Encounter for surgical aftercare following surgery on the circulatory system: Secondary | ICD-10-CM

## 2013-06-09 NOTE — Progress Notes (Signed)
Bilateral Lower Ext. Venous Duplex Completed. Technically limited study due to body habitus.  Marilynne Halsted, RDMS, RVT

## 2013-06-18 ENCOUNTER — Ambulatory Visit (INDEPENDENT_AMBULATORY_CARE_PROVIDER_SITE_OTHER): Payer: Medicare Other | Admitting: Family Medicine

## 2013-06-18 ENCOUNTER — Encounter: Payer: Self-pay | Admitting: Family Medicine

## 2013-06-18 VITALS — BP 118/61 | HR 57 | Temp 97.6°F | Wt 340.0 lb

## 2013-06-18 DIAGNOSIS — R093 Abnormal sputum: Secondary | ICD-10-CM

## 2013-06-18 DIAGNOSIS — IMO0001 Reserved for inherently not codable concepts without codable children: Secondary | ICD-10-CM

## 2013-06-18 DIAGNOSIS — R0989 Other specified symptoms and signs involving the circulatory and respiratory systems: Secondary | ICD-10-CM

## 2013-06-18 LAB — POCT GLYCOSYLATED HEMOGLOBIN (HGB A1C): Hemoglobin A1C: 7.9

## 2013-06-18 NOTE — Progress Notes (Signed)
  Subjective:    Patient ID: [name], male    DOB: [birth year], 72 y.o.   MRN: [id number]  HPI  Diabetes  Taking and tolerating diabetes medication: Yes > Lantus 75 U BID, Novolog 10 U TID with meal > Patient claims that I told him to decrease insulin from regimen of 20 units at breakfast, 10 units at lunch, and 20 units at dinner which he was taking when his A1C was 7.0 in October 2013, but I have no record of ever instructing to decrease novolog dosing   Fasting blood sugars: high - does not bring meter to visit Hypoglycemic symptoms: No Taking ACE-I: No - claims that he cannot tolerate it due to dry cough and that he thinks ARB caused excessive weight gain  Taking statin: Yes - Pravastatin 40 mg daily Diet changes: No Exercise: No - confined to wheelchair secondary to obesity Visual problems: Yes Last eye exam: unknown Last dental visit: unknown Foot ulcers: No Numbness/tingling: Yes - has neuropathy Diabetic Labs:  Lab Results  Component Value Date   HGBA1C 7.9 06/18/2013   HGBA1C 8.3 03/17/2013   HGBA1C 7.0 10/05/2012   Lab Results  Component Value Date   LDLCALC 78 04/17/2011   CREATININE 0.90 11/10/2012   Last microalbumin: No results found for this basename: MICROALBUR, [id number]    Persistent Cough: Feels like phlegm caught in throat, difficult to expel, not associated with dyspnea, chest pain, fever, chills, dysphagia, odynophagia or weight loss    Review of Systems     Objective:   Physical Exam BP 118/61  Pulse 57  Temp(Src) 97.6 F (36.4 C) (Oral)  Wt 340 lb (154.223 kg)  BMI 46.1 kg/m2  Gen: elderly AAM, morbidly obese, confined to riding motorized wheelchair, non ill appearing, pleasant and conversant  Oropharynx: clear and moist, trachea midline  CV: 3/6 systolic crescendo-decrescendo ejection murmur  Lungs: clear to auscultation in upper lung fields Foot exam: declined by patient        Assessment & Plan:

## 2013-06-18 NOTE — Patient Instructions (Addendum)
Please increase your novolog to 20 units with breakfast and dinner and 10 units with lunch. Continue with 75 units of Lantus twice a day. Please come back for eye exam, urine and foot check.   We can do that in September.   Sincerely,   Dr. Clinton Sawyer

## 2013-06-20 DIAGNOSIS — R0989 Other specified symptoms and signs involving the circulatory and respiratory systems: Secondary | ICD-10-CM | POA: Insufficient documentation

## 2013-06-20 NOTE — Assessment & Plan Note (Signed)
While this is very bothersome for the patient, there is no clear etiology and no  symptoms concerning for infection, lesions, and systemic process. I encouraged watchful waiting. Will readdress at next visit.

## 2013-06-20 NOTE — Assessment & Plan Note (Signed)
Patient needs to adjust short acting insulin. In October 2013, A1C was 7.0, therefore return to short acting insulin regimen of Novolog 20 units with breakfast and dinner and 10 units. Patient declined urine evaluation, and patient likely needs to be put back on ARB for renal protection. Also obtain retina pictures at next visit.

## 2013-06-24 ENCOUNTER — Ambulatory Visit: Payer: Medicare Other | Admitting: Internal Medicine

## 2013-06-25 ENCOUNTER — Encounter: Payer: Self-pay | Admitting: Family Medicine

## 2013-07-23 ENCOUNTER — Other Ambulatory Visit: Payer: Self-pay | Admitting: Family Medicine

## 2013-07-26 ENCOUNTER — Other Ambulatory Visit: Payer: Self-pay | Admitting: Family Medicine

## 2013-08-20 ENCOUNTER — Other Ambulatory Visit: Payer: Self-pay | Admitting: Family Medicine

## 2013-08-23 NOTE — Telephone Encounter (Signed)
Please call the patient to let him know that this is now a controlled drug, so he needs an appointment in order to received a refill for tramadol.

## 2013-08-23 NOTE — Telephone Encounter (Signed)
Pt called and informed that he will need appointment - verbalized understanding. Wyatt Haste, RN-BSN

## 2013-09-17 ENCOUNTER — Other Ambulatory Visit: Payer: Self-pay | Admitting: Family Medicine

## 2013-10-21 ENCOUNTER — Other Ambulatory Visit: Payer: Self-pay | Admitting: Family Medicine

## 2013-11-03 ENCOUNTER — Ambulatory Visit: Payer: Medicare Other | Admitting: Family Medicine

## 2013-11-04 ENCOUNTER — Encounter: Payer: Self-pay | Admitting: Family Medicine

## 2013-11-04 ENCOUNTER — Ambulatory Visit (INDEPENDENT_AMBULATORY_CARE_PROVIDER_SITE_OTHER): Payer: Medicare Other | Admitting: Family Medicine

## 2013-11-04 VITALS — BP 106/69 | HR 50 | Temp 98.1°F | Ht 72.0 in

## 2013-11-04 DIAGNOSIS — I10 Essential (primary) hypertension: Secondary | ICD-10-CM

## 2013-11-04 DIAGNOSIS — J309 Allergic rhinitis, unspecified: Secondary | ICD-10-CM

## 2013-11-04 LAB — POCT GLYCOSYLATED HEMOGLOBIN (HGB A1C): Hemoglobin A1C: 7

## 2013-11-04 LAB — COMPLETE METABOLIC PANEL WITH GFR
ALT: 11 U/L (ref 0–53)
Albumin: 2.9 g/dL — ABNORMAL LOW (ref 3.5–5.2)
CO2: 30 mEq/L (ref 19–32)
Calcium: 8.5 mg/dL (ref 8.4–10.5)
Chloride: 101 mEq/L (ref 96–112)
Creat: 0.99 mg/dL (ref 0.50–1.35)
GFR, Est African American: 87 mL/min
GFR, Est Non African American: 75 mL/min
Glucose, Bld: 105 mg/dL — ABNORMAL HIGH (ref 70–99)
Sodium: 138 mEq/L (ref 135–145)
Total Bilirubin: 0.4 mg/dL (ref 0.3–1.2)
Total Protein: 7.1 g/dL (ref 6.0–8.3)

## 2013-11-04 MED ORDER — ONETOUCH LANCETS MISC: Status: DC

## 2013-11-04 MED ORDER — GLUCOSE BLOOD VI STRP: ORAL_STRIP | Status: DC

## 2013-11-04 MED ORDER — FLUNISOLIDE 25 MCG/ACT (0.025%) NA SOLN: 2.0000 | Freq: Two times a day (BID) | NASAL | Status: DC

## 2013-11-04 MED ORDER — ONETOUCH ULTRA SYSTEM W/DEVICE KIT: 1.0000 | PACK | Freq: Once | Status: DC

## 2013-11-04 NOTE — Patient Instructions (Signed)
[name],   It was nice to see you. We need to check our potassium level and kidney function today. I will call you with the results and we will decide on what to do with the blood pressure medication at that time. Overall, your diabetes is improved. We will get urine studies and an eye exam at the next visit.   Please come back within the next 4 weeks.   Sincerely,   Dr. Zella Richer

## 2013-11-04 NOTE — Progress Notes (Signed)
Patient ID: [name], male   DOB: Mar 30, 1941, 72 y.o.   MRN: [id number]   Subjective:   Diabetes  Recent Issues:  Hypoglycemia: No; lowest was 97 since last visit, highest 175 Patient with need to change meter lancets to OneTouch for insurance reasons.    Medication Compliance: Diabetes medication: Yes Lantus 75 U BID, Novolog 10 units with meals  Taking ACE-I: No - claims cough Taking statin: Yes - pravastatin   Behavioral: Home CBG Monitoring: Yes Diet changes: Yes Exercise: No  Health Maintenance: Visual problems: blurriness, pt has glaucoma for which he takes lantaprost  Last eye exam: unknown Foot ulcers: No; patient chronic osteomyelitis of left leg   Hypertension  Home BP monitoring: no  Office BP: BP Readings from Last 3 Encounters:  11/04/13 106/69  06/18/13 118/61  05/13/13 145/80    Prescribed meds: furosemide 40 mg TID (according to his orthopedist he is supposed to take it 3x per day to keep swelling down but no labs have been checked since this change was made 1 month ago), metoprolol 100 mg BID, Triamterene-HCTZ  Hypertension ROS:  Taking medications as prescribed:No - changes made as above  Chest pain: No Shortness of breath: No Swelling of extremities: Yes TIA symptoms: No Regular Exercise: no - confined to wheelchair 2/2 obesity Low Na+ Diet: no  Medication Management:  Given changes in the patient's insurance coverage, he needs to order new diabetic supplies including a One Touch glucometer, lancets and test strips. Additionally he needs to change his allergic rhinitis medication from fluticasone to another intranasal steroid spray. These changes were made for the patient today, and prescriptions were sent to his pharmacy.  Current Outpatient Prescriptions on File Prior to Visit  Medication Sig Dispense Refill  . doxycycline (VIBRA-TABS) 100 MG tablet Take 100 mg by mouth 2 (two) times daily.      . fluticasone (CUTIVATE) 0.05 % cream  APPLY TO AFFECTED AREA TWICE DAILY  60 g  3  . fluticasone (FLOVENT HFA) 220 MCG/ACT inhaler Inhale 1 puff into the lungs 2 (two) times daily.      . furosemide (LASIX) 40 MG tablet TAKE 1 TABLET BY MOUTH TWICE DAILY  60 tablet  3  . LANTUS SOLOSTAR 100 UNIT/ML SOPN INJECT 75 UNITS SUBCUTANEOUSLY TWICE DAILY  45 mL  3  . latanoprost (XALATAN) 0.005 % ophthalmic solution Place 1 drop into both eyes at bedtime.      Marland Kitchen latanoprost (XALATAN) 0.005 % ophthalmic solution INSTILL 1 DROP IN BOTH EYES EVERY EVENING FOR GLAUCOMA  2.5 mL  3  . metoprolol (LOPRESSOR) 100 MG tablet TAKE 1 TABLET BY MOUTH TWICE DAILY (HOLD TABLET IF PULSE IS LESS THAN 60)  60 tablet  3  . NASONEX 50 MCG/ACT nasal spray USE 1 SPRAY INTO EACH NOSTRIL TWICE DAILY AS DIRECTED  17 g  3  . NEXIUM 40 MG capsule TAKE 1 CAPSULE BY MOUTH TWICE DAILY FOR REFLUX  60 capsule  11  . NOVOLOG FLEXPEN 100 UNIT/ML SOPN FlexPen INJECT 20 UNITS SUBCUTANEOUSLY WITH LUNCH AND 20 UNITS WITH SUPPER  15 mL  6  . pravastatin (PRAVACHOL) 20 MG tablet TAKE 2 TABLETS BY MOUTH EVERY DAY  60 tablet  11  . PROAIR HFA 108 (90 BASE) MCG/ACT inhaler INHALE 2 PUFFS BY MOUTH EVERY 4 HOURS AS NEEDED FOR WHEEZING OR SHORTNESS OF BREATH  8.5 g  6  . silver sulfADIAZINE (SILVADENE) 1 % cream APPLY AS DIRECTED TO WOUND EVERY DAY  400 g  3  . tamsulosin (FLOMAX) 0.4 MG CAPS capsule TAKE 2 CAPSULES BY MOUTH EVERY MORNING  60 capsule  11  . albuterol (PROVENTIL HFA;VENTOLIN HFA) 108 (90 BASE) MCG/ACT inhaler Inhale 2 puffs into the lungs every 4 (four) hours as needed. For wheezing and shortness of breath      . aspirin 81 MG chewable tablet Chew 81 mg by mouth daily.       Marland Kitchen BAYER CONTOUR TEST test strip USE TO CHECK BLOOD SUGAR TWICE DAILY  100 each  11  . cyclobenzaprine (FLEXERIL) 10 MG tablet Take 1 tablet (10 mg total) by mouth 3 (three) times daily as needed (muscle pain).  30 tablet  0  . EASY TOUCH LANCETS 32G/TWIST MISC USE TO CHECK BLOOD SUGAR TWICE DAILY  100  each  11  . EASY TOUCH PEN NEEDLES 31G X 8 MM MISC USE WITH FLEXPEN AS DIRECTED  100 each  11  . FLOVENT HFA 220 MCG/ACT inhaler INHALE 1 PUFF BY MOUTH, INTO THE LUNGS,TWICE DAILY  12 g  6  . HYDROcodone-acetaminophen (NORCO/VICODIN) 5-325 MG per tablet Take 1 or 2 po Q 6hrs for pain  20 tablet  0  . mometasone (NASONEX) 50 MCG/ACT nasal spray Place 1 spray into the nose 2 (two) times daily. Both nostrils      . mupirocin ointment (BACTROBAN) 2 % APPLY TO AFFECTED AREA TWICE DAILY  30 g  3  . sennosides-docusate sodium (SENOKOT-S) 8.6-50 MG tablet Take 1 tablet by mouth daily as needed for constipation.  60 tablet  2  . traMADol (ULTRAM) 50 MG tablet Take 50 mg by mouth every 6 (six) hours as needed. For pain      . traMADol (ULTRAM) 50 MG tablet TAKE 1 TABLET BY MOUTH TWICE DAILY FOR PAIN  60 tablet  0  . triamterene-hydrochlorothiazide (MAXZIDE-25) 37.5-25 MG per tablet TAKE 1 TABLET BY MOUTH EVERY DAY FOR BLOOD PRESSURE  30 tablet  11   No current facility-administered medications on file prior to visit.     Review of Systems:  Pertinent items are noted in HPI.     Objective:   Physical Exam: BP 106/69  Pulse 50  Temp(Src) 98.1 F (36.7 C) (Oral)  Ht 6' (1.829 m)  General: elderly male, obese, confined to wheelchair, non-acutely ill-appearing Eyes: deferred Cardiovascular: RRR, 3/6 blowing systolic murmur at LLSB Pulmonary: clear to auscultation bilaterally Feet: thick scaling of right foot with healing ulcer of the anterior right shin; sensation intact to monofilament testing on the right foot; left foot bandaged in Silver sock dressing per orthopedic study protocol  Labs:   Diabetic Labs:  Lab Results  Component Value Date   HGBA1C 7.9 06/18/2013   HGBA1C 8.3 03/17/2013   HGBA1C 7.0 10/05/2012   Lab Results  Component Value Date   LDLCALC 78 04/17/2011   CREATININE 0.90 11/10/2012   Last microalbumin: No results found for this basename: MICROALBUR, [id number]         Assessment & Plan:

## 2013-11-08 ENCOUNTER — Telehealth: Payer: Self-pay | Admitting: Family Medicine

## 2013-11-08 DIAGNOSIS — I10 Essential (primary) hypertension: Secondary | ICD-10-CM

## 2013-11-08 DIAGNOSIS — J309 Allergic rhinitis, unspecified: Secondary | ICD-10-CM | POA: Insufficient documentation

## 2013-11-08 MED ORDER — METOPROLOL TARTRATE 100 MG PO TABS: 50.0000 mg | ORAL_TABLET | Freq: Two times a day (BID) | ORAL | Status: DC

## 2013-11-08 NOTE — Telephone Encounter (Signed)
Message left for patient stating that metabolic panel was normal. Also instructed to decrease his metoprolol dose from 100 mg twice a day to 50 mg twice a day given that his pulse was 50 at the last visit. Patient instructed to call the clinic to verify that he received this information.

## 2013-11-08 NOTE — Assessment & Plan Note (Signed)
Assessment: diabetes well controlled with A1c at 7.0 while on 75 units of Lantus daily with 10 units NovoLog for each meal Plan:  - continue current insulin regimen  - patient will return for urine microalbumin checked as well as retinal photographs

## 2013-11-08 NOTE — Assessment & Plan Note (Signed)
Assessment: patient compliant with medication and appears overtreated as evidenced by low normal blood pressure and bradycardia; this could be worsened by his increase in furosemide from once daily to 3 times daily Plan: check metabolic panel and if potassium within normal limits, then continue Lasix at current dose and reduce metoprolol to 50 mg twice a day from 100 mg

## 2013-11-08 NOTE — Assessment & Plan Note (Signed)
Assessment: well controlled Plan: change nasal steroids to flunisolide per insurance

## 2013-11-10 NOTE — Telephone Encounter (Signed)
Patient calls to confirm that he did get Dr. Yetta Numbers message.

## 2013-11-12 ENCOUNTER — Encounter: Payer: Self-pay | Admitting: Family Medicine

## 2013-11-18 ENCOUNTER — Other Ambulatory Visit: Payer: Self-pay | Admitting: Family Medicine

## 2013-11-29 ENCOUNTER — Telehealth: Payer: Self-pay | Admitting: Family Medicine

## 2013-11-29 NOTE — Telephone Encounter (Signed)
[facility] Aggie Cosier called and needs the recent lab results faxed to her at [phone] attention Aggie Cosier. JW

## 2013-11-29 NOTE — Telephone Encounter (Signed)
1.) called pt and received verbal consent to fax lab results to [facility] (also verbal consent given to Gwendalyn Ege.) 2.) faxed recent labs. [name], [name]

## 2013-12-06 ENCOUNTER — Telehealth: Payer: Self-pay | Admitting: Family Medicine

## 2013-12-06 ENCOUNTER — Encounter: Payer: Self-pay | Admitting: Family Medicine

## 2013-12-06 NOTE — Telephone Encounter (Signed)
[employer] called back and want's a copy of [place] cholesterol also faxed. jw

## 2013-12-06 NOTE — Telephone Encounter (Signed)
Faxed labs. See note. Do not have cholesterol results. Lorenda Hatchet, Renato Battles

## 2013-12-06 NOTE — Telephone Encounter (Signed)
Cholesterol has not been drawn since 2012. I will send a letter asking patient to schedule a lab visit to have cholesterol drawn.

## 2013-12-13 ENCOUNTER — Telehealth: Payer: Self-pay | Admitting: Family Medicine

## 2013-12-13 DIAGNOSIS — J309 Allergic rhinitis, unspecified: Secondary | ICD-10-CM

## 2013-12-13 NOTE — Telephone Encounter (Signed)
Called pt. He c/o burning nose with using Nasalide, he would like the Nasonex back. It worked. Will fwd. To PCP for review. Lorenda Hatchet, Renato Battles

## 2013-12-13 NOTE — Telephone Encounter (Signed)
Pt called and would like someone to call him concerning his medication. He is having a problem with the change. jw

## 2013-12-14 MED ORDER — MOMETASONE FUROATE 50 MCG/ACT NA SUSP: 2.0000 | Freq: Every day | NASAL | Status: DC

## 2013-12-14 NOTE — Telephone Encounter (Signed)
Rx for nasonex sent

## 2013-12-16 ENCOUNTER — Other Ambulatory Visit: Payer: Self-pay | Admitting: Family Medicine

## 2014-01-14 ENCOUNTER — Other Ambulatory Visit: Payer: Self-pay | Admitting: Family Medicine

## 2014-01-21 ENCOUNTER — Telehealth: Payer: Self-pay | Admitting: Family Medicine

## 2014-01-21 NOTE — Telephone Encounter (Signed)
error 

## 2014-02-03 ENCOUNTER — Other Ambulatory Visit: Payer: Self-pay | Admitting: Family Medicine

## 2014-02-07 ENCOUNTER — Encounter: Payer: Self-pay | Admitting: Family Medicine

## 2014-02-07 ENCOUNTER — Ambulatory Visit (INDEPENDENT_AMBULATORY_CARE_PROVIDER_SITE_OTHER): Payer: Medicare Other | Admitting: Family Medicine

## 2014-02-07 VITALS — BP 183/69 | HR 87 | Ht 72.0 in | Wt >= 6400 oz

## 2014-02-07 DIAGNOSIS — R220 Localized swelling, mass and lump, head: Secondary | ICD-10-CM

## 2014-02-07 DIAGNOSIS — M86669 Other chronic osteomyelitis, unspecified tibia and fibula: Secondary | ICD-10-CM

## 2014-02-07 DIAGNOSIS — E1165 Type 2 diabetes mellitus with hyperglycemia: Principal | ICD-10-CM

## 2014-02-07 DIAGNOSIS — I10 Essential (primary) hypertension: Secondary | ICD-10-CM

## 2014-02-07 DIAGNOSIS — M8660 Other chronic osteomyelitis, unspecified site: Secondary | ICD-10-CM

## 2014-02-07 DIAGNOSIS — R221 Localized swelling, mass and lump, neck: Secondary | ICD-10-CM

## 2014-02-07 DIAGNOSIS — E1142 Type 2 diabetes mellitus with diabetic polyneuropathy: Secondary | ICD-10-CM

## 2014-02-07 DIAGNOSIS — E1149 Type 2 diabetes mellitus with other diabetic neurological complication: Secondary | ICD-10-CM

## 2014-02-07 DIAGNOSIS — IMO0001 Reserved for inherently not codable concepts without codable children: Secondary | ICD-10-CM

## 2014-02-07 HISTORY — DX: Type 2 diabetes mellitus with diabetic polyneuropathy: E11.42

## 2014-02-07 LAB — POCT GLYCOSYLATED HEMOGLOBIN (HGB A1C): Hemoglobin A1C: 8.4

## 2014-02-07 MED ORDER — TRAMADOL HCL 50 MG PO TABS: ORAL_TABLET | ORAL | Status: DC

## 2014-02-07 NOTE — Assessment & Plan Note (Signed)
A: Worsening control of DM and sequelae of the disease, pt's reported home CBG of 135-145 not consistent with an A1C of 8.4; I don't think that infection is the cause P:  - Increase novolog to 12 units with breakfast and dinner, increase novolog with lunch to 15 units (total increase in short acting by 9 units or 30% of previous amount - Cont Lantus 75 units BID - Attempted diabetic eye fundoscopic photography today, if not successful, will refer to ophthalmology (claimed to be seeing Dr. Ricki Miller in March 2014 but never did) - Obtain urine microalbumin today, given ARB as indicated since ACE-I allergic - Send rx for diabetic shoes

## 2014-02-07 NOTE — Assessment & Plan Note (Signed)
A: pos monofilament testing P: continue to work for improved control of CBG; maintain pt in diabetic shoes

## 2014-02-07 NOTE — Patient Instructions (Addendum)
Mr. [name],   It was great to see you. I am glad that you are doing well. These are the things that we discussed.   1. Diabetes - I want to increase your Novolog to 15 units with lunch and 12 units with breakfast and dinner. I will send a letter for the shoes and let you know about the eye exam.   2. Cheek Lesion - This is likely a blocked duct or cyst in the cheek. It could also be a fatty tumor. I think we should refer you to an ENT but you can let me know when that is best for you.   3. Pain - I will give you prescriptions of tramadol today.   4. Blood Pressure - We can recheck this in about 1 month.   Come back in 4-6 weeks for a blood pressure check.   Sincerely,   Dr. Maricela Bo

## 2014-02-07 NOTE — Progress Notes (Signed)
Patient ID: [name], male   DOB: 01-16-1941, 73 y.o.   MRN: [id number]   Subjective:   73 year old M with DM type 2, obesity, chronic osteomyelitis of LLE, and HTN who presents for evaluation of diabetes, hypertension, and a cheek mass.   Diabetes  Recent Issues: pt reports taking his meds as prescribed and no major changes to his medications, cannot explain rise if A1C of 1.4% in 3 mos   Medication Compliance: Diabetes medication: Yes Lantus 75 units twice daily, Novolog 10 units with meals Taking ACE-I: No Taking statin: Yes pravastatin 40   Behavioral: Home CBG Monitoring: Yes (135-145) Diet changes: No Exercise: No - wheelchair bound   Health Maintenance:  Visual problems: blurriness, pt has glaucoma for which he takes lantaprost  Last eye exam: unknown Last dental visit: unknown Foot ulcers: No - Pt is requesting new pair of diabetic shoes    Hypertension  Home BP monitoring: no  Office BP: BP Readings from Last 3 Encounters:  02/07/14 183/69  11/04/13 106/69  06/18/13 118/61    Prescribed meds: furosemide 40 mg BID, metoprolol 100 mg BID, Triamterene-HCTZ 37.5-25   Hypertension ROS:  Taking medications as prescribed:Yes Chest pain: No Shortness of breath: No Swelling of extremities: Yes - chronic LE edema bilaterally TIA symptoms: No Regular exercise: No Low Na+ diet: No Alcohol/tobacco/drug use: No  Cheek Mass Right side, > 5 years duration, slowly getting larger, not painful, no drainage or bleeding, no hx of infection or trauma; no numbness of cheek or oral cavity, no difficulty speaking    Review of Systems:  See HPI     Objective:   Physical Exam: BP 183/69  Pulse 87  Ht 6' (1.829 m)  Wt 412 lb (186.882 kg)  BMI 55.87 kg/m2  General: elderly African American male, morbidly obese, chronically ill appearing, no distress, conversant Face: 1 cm x 0.5 cm subcutaneous nodule in the right cheek palpable from the external side and internal  mucosa without tenderness or fluctuance, no inherent to the jaw Cardiovascular: RRR, 3/6 medium pitch systolic murmur at throughout, no carotid bruits Pulmonary: clear to auscultation bilaterally Feet: bunions, dry cracking heels, nail exam normal nails without lesions and dystrophic nails, reduced sensation at 3rd and 5th digits on right foot, pre-ulcerative calluses on plantar surface of right foot, venous stasis dermatitis noted and marked lichenification of feet bilaterally with an open draining would of the left ankle  Labs:   Diabetic Labs:  Lab Results  Component Value Date   HGBA1C 8.4 02/07/2014   HGBA1C 7.0 11/04/2013   HGBA1C 7.9 06/18/2013   Lab Results  Component Value Date   LDLCALC 78 04/17/2011   CREATININE 0.99 11/04/2013   Last microalbumin: No results found for this basename: MICROALBUR,  [id number]        Assessment & Plan:

## 2014-02-07 NOTE — Assessment & Plan Note (Signed)
A: usually elevated BP today (180s/60s) on same medications the previously controlled it well; no clear cause but pt is stressed about his ride P: no evidence of urgency, so will continue with same meds:  furosemide maxide, metoprolol; recheck in 1 month

## 2014-02-07 NOTE — Assessment & Plan Note (Signed)
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

## 2014-02-07 NOTE — Assessment & Plan Note (Signed)
A: stable wound of LLE but painful P: cont following with Dr. Sharol Given, given refill for tramadol PRN

## 2014-02-08 ENCOUNTER — Telehealth: Payer: Self-pay | Admitting: *Deleted

## 2014-02-08 ENCOUNTER — Encounter: Payer: Self-pay | Admitting: Family Medicine

## 2014-02-08 DIAGNOSIS — E785 Hyperlipidemia, unspecified: Secondary | ICD-10-CM

## 2014-02-08 LAB — MICROALBUMIN / CREATININE URINE RATIO
CREATININE, URINE: 161.6 mg/dL
MICROALB/CREAT RATIO: 16.8 mg/g (ref 0.0–30.0)
Microalb, Ur: 2.72 mg/dL — ABNORMAL HIGH (ref 0.00–1.89)

## 2014-02-08 NOTE — Telephone Encounter (Signed)
Prior Authorization received from [employer] for Pravastatin.  PA form placed in provider box for completion. Derl Barrow, RN

## 2014-02-08 NOTE — Addendum Note (Signed)
Addended by: Lorenza Cambridge on: 02/08/2014 10:11 AM   Modules accepted: Orders

## 2014-02-08 NOTE — Telephone Encounter (Signed)
The patient does not have to be on pravastatin. We can change to another one that is covered by his insurance. Please contact the pharmacy to see if they have a list of approved statins so that we do not have to complete this form in the future. Thanks.

## 2014-02-09 NOTE — Telephone Encounter (Signed)
Phone call to [place], [employer].  Per the representative, with the pravastatin, there is a limit when the pt can take the medication.  Only one tab can be taken on a daily bases.  There is a 40 mg tab taken once a day that can be prescribed without having a prior authorization completed.  Since the pt was prescribe 20 mg 2 tabs daily, a prior authorization is required.  Derl Barrow, RN

## 2014-02-10 ENCOUNTER — Other Ambulatory Visit: Payer: Self-pay | Admitting: Family Medicine

## 2014-02-10 MED ORDER — PRAVASTATIN SODIUM 40 MG PO TABS: 40.0000 mg | ORAL_TABLET | Freq: Every day | ORAL | Status: DC

## 2014-02-10 NOTE — Telephone Encounter (Signed)
An order was sent for 40 mg tablets to be taken once daily.

## 2014-02-18 ENCOUNTER — Encounter: Payer: Self-pay | Admitting: Family Medicine

## 2014-03-10 ENCOUNTER — Other Ambulatory Visit: Payer: Self-pay | Admitting: Family Medicine

## 2014-03-11 NOTE — Telephone Encounter (Signed)
Please call the patient to see if he is using this medication any more. This is really supposed to be prescribed by his orthopedic surgeon who tends to his wounds. Therefore, I need records from Dr. Sharol Given at [place] in order to continue prescribing this medication.

## 2014-03-15 ENCOUNTER — Other Ambulatory Visit (HOSPITAL_COMMUNITY): Payer: Self-pay | Admitting: Orthopedic Surgery

## 2014-03-16 ENCOUNTER — Encounter (HOSPITAL_COMMUNITY): Payer: Self-pay

## 2014-03-16 ENCOUNTER — Encounter (HOSPITAL_COMMUNITY): Payer: Self-pay | Admitting: *Deleted

## 2014-03-16 NOTE — Telephone Encounter (Signed)
Did you ever speak to this patient regarding this cream?

## 2014-03-17 MED ORDER — DEXTROSE 5 % IV SOLN
3.0000 g | INTRAVENOUS | Status: AC
  Administered 2014-03-18: 3 g via INTRAVENOUS
  Filled 2014-03-17: qty 3000

## 2014-03-18 ENCOUNTER — Inpatient Hospital Stay (HOSPITAL_COMMUNITY)
Admission: RE | Admit: 2014-03-18 | Discharge: 2014-03-22 | DRG: 264 | Disposition: A | Discharge status: Home / Self Care | Payer: Medicare Other | Source: Ambulatory Visit | Attending: Orthopedic Surgery | Admitting: Orthopedic Surgery

## 2014-03-18 ENCOUNTER — Encounter (HOSPITAL_COMMUNITY): Payer: Self-pay | Admitting: *Deleted

## 2014-03-18 ENCOUNTER — Encounter (HOSPITAL_COMMUNITY)
Admission: RE | Disposition: A | Discharge status: Home / Self Care | Payer: Self-pay | Source: Ambulatory Visit | Attending: Orthopedic Surgery

## 2014-03-18 ENCOUNTER — Inpatient Hospital Stay (HOSPITAL_COMMUNITY): Payer: Medicare Other | Admitting: Certified Registered"

## 2014-03-18 ENCOUNTER — Inpatient Hospital Stay (HOSPITAL_COMMUNITY): Payer: Medicare Other

## 2014-03-18 ENCOUNTER — Encounter (HOSPITAL_COMMUNITY): Payer: Medicare Other | Admitting: Certified Registered"

## 2014-03-18 DIAGNOSIS — Z6841 Body Mass Index (BMI) 40.0 and over, adult: Secondary | ICD-10-CM

## 2014-03-18 DIAGNOSIS — Z8546 Personal history of malignant neoplasm of prostate: Secondary | ICD-10-CM

## 2014-03-18 DIAGNOSIS — I872 Venous insufficiency (chronic) (peripheral): Principal | ICD-10-CM | POA: Diagnosis present

## 2014-03-18 DIAGNOSIS — K219 Gastro-esophageal reflux disease without esophagitis: Secondary | ICD-10-CM | POA: Diagnosis present

## 2014-03-18 DIAGNOSIS — M1990 Unspecified osteoarthritis, unspecified site: Secondary | ICD-10-CM | POA: Diagnosis present

## 2014-03-18 DIAGNOSIS — L988 Other specified disorders of the skin and subcutaneous tissue: Secondary | ICD-10-CM | POA: Diagnosis present

## 2014-03-18 DIAGNOSIS — I890 Lymphedema, not elsewhere classified: Secondary | ICD-10-CM | POA: Diagnosis present

## 2014-03-18 DIAGNOSIS — K449 Diaphragmatic hernia without obstruction or gangrene: Secondary | ICD-10-CM | POA: Diagnosis present

## 2014-03-18 DIAGNOSIS — E119 Type 2 diabetes mellitus without complications: Secondary | ICD-10-CM | POA: Diagnosis present

## 2014-03-18 DIAGNOSIS — L97929 Non-pressure chronic ulcer of unspecified part of left lower leg with unspecified severity: Secondary | ICD-10-CM | POA: Diagnosis present

## 2014-03-18 DIAGNOSIS — Z96649 Presence of unspecified artificial hip joint: Secondary | ICD-10-CM

## 2014-03-18 DIAGNOSIS — I10 Essential (primary) hypertension: Secondary | ICD-10-CM | POA: Diagnosis present

## 2014-03-18 DIAGNOSIS — D62 Acute posthemorrhagic anemia: Secondary | ICD-10-CM | POA: Diagnosis not present

## 2014-03-18 DIAGNOSIS — L97209 Non-pressure chronic ulcer of unspecified calf with unspecified severity: Secondary | ICD-10-CM | POA: Diagnosis present

## 2014-03-18 HISTORY — PX: I&D EXTREMITY: SHX5045

## 2014-03-18 HISTORY — PX: IRRIGATION AND DEBRIDEMENT ABSCESS: SHX5252

## 2014-03-18 HISTORY — DX: Other seasonal allergic rhinitis: J30.2

## 2014-03-18 LAB — CBC
HEMATOCRIT: 33.6 % — AB (ref 39.0–52.0)
Hemoglobin: 10.8 g/dL — ABNORMAL LOW (ref 13.0–17.0)
MCH: 26.5 pg (ref 26.0–34.0)
MCHC: 32.1 g/dL (ref 30.0–36.0)
MCV: 82.4 fL (ref 78.0–100.0)
Platelets: 317 10*3/uL (ref 150–400)
RBC: 4.08 MIL/uL — ABNORMAL LOW (ref 4.22–5.81)
RDW: 15.6 % — ABNORMAL HIGH (ref 11.5–15.5)
WBC: 10.1 10*3/uL (ref 4.0–10.5)

## 2014-03-18 LAB — COMPREHENSIVE METABOLIC PANEL
ALBUMIN: 2.8 g/dL — AB (ref 3.5–5.2)
ALT: 9 U/L (ref 0–53)
AST: 12 U/L (ref 0–37)
Alkaline Phosphatase: 140 U/L — ABNORMAL HIGH (ref 39–117)
BILIRUBIN TOTAL: 0.3 mg/dL (ref 0.3–1.2)
BUN: 19 mg/dL (ref 6–23)
CHLORIDE: 103 meq/L (ref 96–112)
CO2: 27 mEq/L (ref 19–32)
Calcium: 9 mg/dL (ref 8.4–10.5)
Creatinine, Ser: 0.82 mg/dL (ref 0.50–1.35)
GFR calc non Af Amer: 85 mL/min — ABNORMAL LOW (ref >90)
GLUCOSE: 85 mg/dL (ref 70–99)
Potassium: 4.6 mEq/L (ref 3.7–5.3)
Sodium: 141 mEq/L (ref 137–147)
Total Protein: 8 g/dL (ref 6.0–8.3)

## 2014-03-18 LAB — PROTIME-INR
INR: 1.12 (ref 0.00–1.49)
PROTHROMBIN TIME: 14.2 s (ref 11.6–15.2)

## 2014-03-18 LAB — GLUCOSE, CAPILLARY
GLUCOSE-CAPILLARY: 60 mg/dL — AB (ref 70–99)
GLUCOSE-CAPILLARY: 76 mg/dL (ref 70–99)
GLUCOSE-CAPILLARY: 102 mg/dL — AB (ref 70–99)
Glucose-Capillary: 87 mg/dL (ref 70–99)
Glucose-Capillary: 74 mg/dL (ref 70–99)
Glucose-Capillary: 102 mg/dL — ABNORMAL HIGH (ref 70–99)
Glucose-Capillary: 66 mg/dL — ABNORMAL LOW (ref 70–99)

## 2014-03-18 LAB — APTT: aPTT: 37 seconds (ref 24–37)

## 2014-03-18 SURGERY — IRRIGATION AND DEBRIDEMENT EXTREMITY
Anesthesia: General | Site: Leg Lower | Laterality: Left

## 2014-03-18 MED ORDER — PANTOPRAZOLE SODIUM 40 MG PO TBEC
40.0000 mg | DELAYED_RELEASE_TABLET | Freq: Every day | ORAL | Status: DC
  Administered 2014-03-18 – 2014-03-22 (×4): 40 mg via ORAL
  Filled 2014-03-18 (×4): qty 1

## 2014-03-18 MED ORDER — SODIUM CHLORIDE 0.9 % IR SOLN
Status: DC | PRN
  Administered 2014-03-18: 3000 mL

## 2014-03-18 MED ORDER — CEFAZOLIN SODIUM-DEXTROSE 2-3 GM-% IV SOLR
2.0000 g | Freq: Four times a day (QID) | INTRAVENOUS | Status: AC
  Administered 2014-03-18 – 2014-03-19 (×3): 2 g via INTRAVENOUS
  Filled 2014-03-18 (×3): qty 50

## 2014-03-18 MED ORDER — SODIUM CHLORIDE 0.9 % IV SOLN
INTRAVENOUS | Status: DC
  Administered 2014-03-18: 10 mL/h via INTRAVENOUS

## 2014-03-18 MED ORDER — FUROSEMIDE 40 MG PO TABS
40.0000 mg | ORAL_TABLET | Freq: Two times a day (BID) | ORAL | Status: DC
  Administered 2014-03-18 – 2014-03-22 (×7): 40 mg via ORAL
  Filled 2014-03-18 (×12): qty 1

## 2014-03-18 MED ORDER — SENNA-DOCUSATE SODIUM 8.6-50 MG PO TABS: 1.0000 | ORAL_TABLET | Freq: Every day | ORAL | Status: DC | PRN

## 2014-03-18 MED ORDER — METOPROLOL TARTRATE 100 MG PO TABS
100.0000 mg | ORAL_TABLET | Freq: Two times a day (BID) | ORAL | Status: DC
  Administered 2014-03-19 – 2014-03-22 (×4): 100 mg via ORAL
  Filled 2014-03-18 (×9): qty 1

## 2014-03-18 MED ORDER — PROPOFOL 10 MG/ML IV BOLUS
INTRAVENOUS | Status: DC | PRN
  Administered 2014-03-18: 180 mg via INTRAVENOUS

## 2014-03-18 MED ORDER — LATANOPROST 0.005 % OP SOLN
1.0000 [drp] | Freq: Every day | OPHTHALMIC | Status: DC
  Administered 2014-03-18 – 2014-03-21 (×4): 1 [drp] via OPHTHALMIC
  Filled 2014-03-18: qty 2.5

## 2014-03-18 MED ORDER — METOCLOPRAMIDE HCL 5 MG/ML IJ SOLN
5.0000 mg | Freq: Three times a day (TID) | INTRAMUSCULAR | Status: DC | PRN
  Administered 2014-03-18 – 2014-03-19 (×3): 5 mg via INTRAVENOUS
  Filled 2014-03-18 (×3): qty 2

## 2014-03-18 MED ORDER — METOPROLOL TARTRATE 50 MG PO TABS
ORAL_TABLET | ORAL | Status: AC
  Administered 2014-03-18: 100 mg via ORAL
  Filled 2014-03-18: qty 2

## 2014-03-18 MED ORDER — INSULIN GLARGINE 100 UNIT/ML ~~LOC~~ SOLN
75.0000 [IU] | Freq: Two times a day (BID) | SUBCUTANEOUS | Status: DC
Start: 2014-03-18 — End: 2014-03-22
  Administered 2014-03-20 – 2014-03-21 (×4): 75 [IU] via SUBCUTANEOUS
  Filled 2014-03-18 (×11): qty 0.75

## 2014-03-18 MED ORDER — INSULIN ASPART 100 UNIT/ML ~~LOC~~ SOLN
0.0000 [IU] | Freq: Three times a day (TID) | SUBCUTANEOUS | Status: DC
  Administered 2014-03-19: 3 [IU] via SUBCUTANEOUS
  Administered 2014-03-19: 4 [IU] via SUBCUTANEOUS
  Administered 2014-03-19 – 2014-03-20 (×3): 3 [IU] via SUBCUTANEOUS
  Administered 2014-03-21: 4 [IU] via SUBCUTANEOUS

## 2014-03-18 MED ORDER — DEXTROSE 50 % IV SOLN
INTRAVENOUS | Status: AC
  Filled 2014-03-18: qty 50

## 2014-03-18 MED ORDER — TAMSULOSIN HCL 0.4 MG PO CAPS
0.8000 mg | ORAL_CAPSULE | Freq: Every day | ORAL | Status: DC
  Administered 2014-03-18 – 2014-03-22 (×5): 0.8 mg via ORAL
  Filled 2014-03-18 (×6): qty 2

## 2014-03-18 MED ORDER — OXYCODONE HCL 5 MG/5ML PO SOLN: 5.0000 mg | Freq: Once | ORAL | Status: AC | PRN

## 2014-03-18 MED ORDER — FLUTICASONE PROPIONATE HFA 220 MCG/ACT IN AERO
1.0000 | INHALATION_SPRAY | Freq: Two times a day (BID) | RESPIRATORY_TRACT | Status: DC
  Administered 2014-03-18 – 2014-03-22 (×8): 1 via RESPIRATORY_TRACT
  Filled 2014-03-18: qty 12

## 2014-03-18 MED ORDER — OXYCODONE-ACETAMINOPHEN 5-325 MG PO TABS
1.0000 | ORAL_TABLET | ORAL | Status: DC | PRN
  Administered 2014-03-18: 2 via ORAL
  Filled 2014-03-18: qty 2

## 2014-03-18 MED ORDER — SENNOSIDES-DOCUSATE SODIUM 8.6-50 MG PO TABS: 1.0000 | ORAL_TABLET | Freq: Every day | ORAL | Status: DC | PRN

## 2014-03-18 MED ORDER — LACTATED RINGERS IV SOLN
INTRAVENOUS | Status: DC
  Administered 2014-03-18: 11:00:00 via INTRAVENOUS

## 2014-03-18 MED ORDER — HYDROMORPHONE HCL PF 1 MG/ML IJ SOLN: 0.5000 mg | INTRAMUSCULAR | Status: DC | PRN

## 2014-03-18 MED ORDER — METOCLOPRAMIDE HCL 5 MG/ML IJ SOLN: 10.0000 mg | Freq: Once | INTRAMUSCULAR | Status: DC | PRN

## 2014-03-18 MED ORDER — ONDANSETRON HCL 4 MG/2ML IJ SOLN
INTRAMUSCULAR | Status: AC
  Filled 2014-03-18: qty 2

## 2014-03-18 MED ORDER — FENTANYL CITRATE 0.05 MG/ML IJ SOLN
INTRAMUSCULAR | Status: DC | PRN
Start: 2014-03-18 — End: 2014-03-18
  Administered 2014-03-18: 50 ug via INTRAVENOUS

## 2014-03-18 MED ORDER — METOPROLOL TARTRATE 100 MG PO TABS
100.0000 mg | ORAL_TABLET | Freq: Once | ORAL | Status: AC
  Administered 2014-03-18: 100 mg via ORAL

## 2014-03-18 MED ORDER — ONDANSETRON HCL 4 MG/2ML IJ SOLN
INTRAMUSCULAR | Status: DC | PRN
  Administered 2014-03-18: 4 mg via INTRAVENOUS

## 2014-03-18 MED ORDER — ALBUTEROL SULFATE HFA 108 (90 BASE) MCG/ACT IN AERS: 2.0000 | INHALATION_SPRAY | RESPIRATORY_TRACT | Status: DC | PRN

## 2014-03-18 MED ORDER — ASPIRIN EC 325 MG PO TBEC
325.0000 mg | DELAYED_RELEASE_TABLET | Freq: Every day | ORAL | Status: DC
  Administered 2014-03-18 – 2014-03-22 (×4): 325 mg via ORAL
  Filled 2014-03-18 (×5): qty 1

## 2014-03-18 MED ORDER — INSULIN ASPART 100 UNIT/ML ~~LOC~~ SOLN
6.0000 [IU] | Freq: Three times a day (TID) | SUBCUTANEOUS | Status: DC
  Administered 2014-03-20 – 2014-03-21 (×4): 6 [IU] via SUBCUTANEOUS

## 2014-03-18 MED ORDER — SIMVASTATIN 5 MG PO TABS
5.0000 mg | ORAL_TABLET | Freq: Every day | ORAL | Status: DC
  Administered 2014-03-18 – 2014-03-21 (×4): 5 mg via ORAL
  Filled 2014-03-18 (×6): qty 1

## 2014-03-18 MED ORDER — LIDOCAINE HCL (CARDIAC) 20 MG/ML IV SOLN
INTRAVENOUS | Status: DC | PRN
  Administered 2014-03-18: 100 mg via INTRAVENOUS

## 2014-03-18 MED ORDER — OXYCODONE HCL 5 MG PO TABS
ORAL_TABLET | ORAL | Status: AC
  Filled 2014-03-18: qty 1

## 2014-03-18 MED ORDER — ALBUTEROL SULFATE (2.5 MG/3ML) 0.083% IN NEBU: 2.5000 mg | INHALATION_SOLUTION | RESPIRATORY_TRACT | Status: DC | PRN

## 2014-03-18 MED ORDER — TRIAMTERENE-HCTZ 37.5-25 MG PO TABS
1.0000 | ORAL_TABLET | Freq: Every day | ORAL | Status: DC
Start: 2014-03-18 — End: 2014-03-22
  Administered 2014-03-18 – 2014-03-22 (×5): 1 via ORAL
  Filled 2014-03-18 (×5): qty 1

## 2014-03-18 MED ORDER — DEXTROSE 50 % IV SOLN
25.0000 mL | Freq: Once | INTRAVENOUS | Status: AC
  Administered 2014-03-18: 25 mL via INTRAVENOUS

## 2014-03-18 MED ORDER — ONDANSETRON HCL 4 MG PO TABS: 4.0000 mg | ORAL_TABLET | Freq: Four times a day (QID) | ORAL | Status: DC | PRN

## 2014-03-18 MED ORDER — METOCLOPRAMIDE HCL 10 MG PO TABS: 5.0000 mg | ORAL_TABLET | Freq: Three times a day (TID) | ORAL | Status: DC | PRN

## 2014-03-18 MED ORDER — ONDANSETRON HCL 4 MG/2ML IJ SOLN
4.0000 mg | Freq: Four times a day (QID) | INTRAMUSCULAR | Status: DC | PRN
Start: 2014-03-18 — End: 2014-03-22
  Administered 2014-03-18: 4 mg via INTRAVENOUS
  Filled 2014-03-18: qty 2

## 2014-03-18 MED ORDER — OXYCODONE HCL 5 MG PO TABS
5.0000 mg | ORAL_TABLET | Freq: Once | ORAL | Status: AC | PRN
  Administered 2014-03-18: 5 mg via ORAL

## 2014-03-18 MED ORDER — FENTANYL CITRATE 0.05 MG/ML IJ SOLN
INTRAMUSCULAR | Status: AC
  Filled 2014-03-18: qty 5

## 2014-03-18 MED ORDER — HYDROMORPHONE HCL PF 1 MG/ML IJ SOLN
0.2500 mg | INTRAMUSCULAR | Status: DC | PRN
  Administered 2014-03-18: 0.25 mg via INTRAVENOUS
  Administered 2014-03-18: 0.5 mg via INTRAVENOUS
  Administered 2014-03-18: 0.25 mg via INTRAVENOUS

## 2014-03-18 MED ORDER — HYDROMORPHONE HCL PF 1 MG/ML IJ SOLN
INTRAMUSCULAR | Status: AC
  Filled 2014-03-18: qty 1

## 2014-03-18 SURGICAL SUPPLY — 45 items
BLADE SURG 10 STRL SS (BLADE) IMPLANT
BNDG COHESIVE 4X5 TAN STRL (GAUZE/BANDAGES/DRESSINGS) ×3 IMPLANT
BNDG COHESIVE 6X5 TAN STRL LF (GAUZE/BANDAGES/DRESSINGS) ×3 IMPLANT
BNDG GAUZE ELAST 4 BULKY (GAUZE/BANDAGES/DRESSINGS) ×6 IMPLANT
CANISTER WOUND CARE 500ML ATS (WOUND CARE) ×3 IMPLANT
COVER SURGICAL LIGHT HANDLE (MISCELLANEOUS) ×3 IMPLANT
CUFF TOURNIQUET SINGLE 18IN (TOURNIQUET CUFF) IMPLANT
CUFF TOURNIQUET SINGLE 24IN (TOURNIQUET CUFF) IMPLANT
CUFF TOURNIQUET SINGLE 34IN LL (TOURNIQUET CUFF) IMPLANT
CUFF TOURNIQUET SINGLE 44IN (TOURNIQUET CUFF) IMPLANT
DRAPE U-SHAPE 47X51 STRL (DRAPES) ×3 IMPLANT
DRSG ADAPTIC 3X8 NADH LF (GAUZE/BANDAGES/DRESSINGS) IMPLANT
DRSG MEPITEL 4X7.2 (GAUZE/BANDAGES/DRESSINGS) ×3 IMPLANT
DRSG VAC ATS MED SENSATRAC (GAUZE/BANDAGES/DRESSINGS) ×3 IMPLANT
DRSG VAC ATS SM SENSATRAC (GAUZE/BANDAGES/DRESSINGS) ×3 IMPLANT
DURAPREP 26ML APPLICATOR (WOUND CARE) ×3 IMPLANT
ELECT CAUTERY BLADE 6.4 (BLADE) IMPLANT
ELECT REM PT RETURN 9FT ADLT (ELECTROSURGICAL)
ELECTRODE REM PT RTRN 9FT ADLT (ELECTROSURGICAL) IMPLANT
GLOVE BIOGEL PI IND STRL 9 (GLOVE) ×1 IMPLANT
GLOVE BIOGEL PI INDICATOR 9 (GLOVE) ×2
GLOVE SURG ORTHO 9.0 STRL STRW (GLOVE) ×3 IMPLANT
GOWN STRL REUS W/ TWL XL LVL3 (GOWN DISPOSABLE) ×2 IMPLANT
GOWN STRL REUS W/TWL XL LVL3 (GOWN DISPOSABLE) ×4
HANDPIECE INTERPULSE COAX TIP (DISPOSABLE) ×4
KIT BASIN OR (CUSTOM PROCEDURE TRAY) ×3 IMPLANT
KIT ROOM TURNOVER OR (KITS) ×3 IMPLANT
MANIFOLD NEPTUNE II (INSTRUMENTS) ×3 IMPLANT
NS IRRIG 1000ML POUR BTL (IV SOLUTION) ×3 IMPLANT
PACK ORTHO EXTREMITY (CUSTOM PROCEDURE TRAY) ×3 IMPLANT
PAD ARMBOARD 7.5X6 YLW CONV (MISCELLANEOUS) ×6 IMPLANT
PADDING CAST COTTON 6X4 STRL (CAST SUPPLIES) ×3 IMPLANT
SET HNDPC FAN SPRY TIP SCT (DISPOSABLE) ×2 IMPLANT
SPONGE GAUZE 4X4 12PLY (GAUZE/BANDAGES/DRESSINGS) ×3 IMPLANT
SPONGE LAP 18X18 X RAY DECT (DISPOSABLE) ×3 IMPLANT
STOCKINETTE IMPERVIOUS 9X36 MD (GAUZE/BANDAGES/DRESSINGS) ×3 IMPLANT
TISSUE THERASKIN 2X3 (Tissue) ×9 IMPLANT
TOWEL OR 17X24 6PK STRL BLUE (TOWEL DISPOSABLE) ×3 IMPLANT
TOWEL OR 17X26 10 PK STRL BLUE (TOWEL DISPOSABLE) ×3 IMPLANT
TUBE ANAEROBIC SPECIMEN COL (MISCELLANEOUS) IMPLANT
TUBE CONNECTING 12'X1/4 (SUCTIONS) ×1
TUBE CONNECTING 12X1/4 (SUCTIONS) ×2 IMPLANT
UNDERPAD 30X30 INCONTINENT (UNDERPADS AND DIAPERS) ×3 IMPLANT
WATER STERILE IRR 1000ML POUR (IV SOLUTION) ×3 IMPLANT
YANKAUER SUCT BULB TIP NO VENT (SUCTIONS) ×3 IMPLANT

## 2014-03-18 NOTE — Care Management Note (Signed)
CARE MANAGEMENT NOTE 03/18/2014  Patient:  [name], [name]   Account Number:  [id number]  Date Initiated:  03/18/2014  Documentation initiated by:  Ricki Miller  Subjective/Objective Assessment:   73 yr old male s/p I & D of left leg ulcer with skin graft and wound vac placement.     Action/Plan:   Case Manager will follow   Anticipated DC Date:     Anticipated DC Plan:           Choice offered to / List presented to:             Status of service:  In process, will continue to follow

## 2014-03-18 NOTE — Anesthesia Procedure Notes (Signed)
Procedure Name: LMA Insertion Date/Time: 03/18/2014 1:49 PM Performed by: Julian Reil Pre-anesthesia Checklist: Patient identified, Emergency Drugs available, Suction available and Patient being monitored Patient Re-evaluated:Patient Re-evaluated prior to inductionOxygen Delivery Method: Circle system utilized Preoxygenation: Pre-oxygenation with 100% oxygen Intubation Type: IV induction LMA: LMA inserted LMA Size: 5.0 Number of attempts: 1 Placement Confirmation: positive ETCO2 and breath sounds checked- equal and bilateral Tube secured with: Tape Dental Injury: Teeth and Oropharynx as per pre-operative assessment

## 2014-03-18 NOTE — Transfer of Care (Signed)
Immediate Anesthesia Transfer of Care Note  Patient: [name]  Procedure(s) Performed: Procedure(s) with comments: IRRIGATION AND DEBRIDEMENT EXTREMITY (Left) - Debridement Left Calf Ulcer, Apply Theraskin and Wound VAC  Patient Location: PACU  Anesthesia Type:General  Level of Consciousness: awake, alert , oriented and patient cooperative  Airway & Oxygen Therapy: Patient Spontanous Breathing and Patient connected to face mask oxygen  Post-op Assessment: Report given to PACU RN, Post -op Vital signs reviewed and stable and Patient moving all extremities  Post vital signs: Reviewed and stable  Complications: No apparent anesthesia complications

## 2014-03-18 NOTE — Op Note (Signed)
OPERATIVE REPORT  DATE OF SURGERY: 03/18/2014  PATIENT:  [name],  73 y.o. male  PRE-OPERATIVE DIAGNOSIS:  Chronic Ulcer Left Leg  POST-OPERATIVE DIAGNOSIS:  Chronic Ulcer Left Leg  PROCEDURE:  Procedure(s): IRRIGATION AND DEBRIDEMENT EXTREMITY Excision of skin and soft tissue for a wound 6 x 10 cm. Application of allograft skin graft 3 units each 2 x 3". Application of wound VAC.  SURGEON:  Surgeon(s): Newt Minion, MD  ANESTHESIA:   general  EBL:  Minimal ML  SPECIMEN:  No Specimen  TOURNIQUET:  * No tourniquets in log *  PROCEDURE DETAILS: Patient is a 73 year old gentleman with venous insufficiency lymphedema and calcification of the subcutaneous tissue with a chronic ulcer on the left leg. Patient has failed conservative care including compressive stockings non-compressive wraps and pneumatic compression without relief and presents at this time for surgical skin grafting. Risks and benefits were discussed including need for additional surgery. Patient states he understands and wished to proceed at this time. Description of procedure patient was brought to the operating room and underwent a general anesthetic. After adequate levels of anesthesia were obtained patient's left lower extremity was prepped using DuraPrep and draped into a sterile field. A 21 blade knife was used to excise the fibrinous exudative tissue from these wounds. There was good petechial bleeding but there still was some calcification of the subcutaneous tissue. This was debrided. 3 of the allograft skin graft units were applied to the wound secured with staples. This was then covered with a Mepitel dressing followed by a wound VAC sponge covered with Ioban dressing. This was set to -125 mmHg suction. This had a good suction fit. The leg was then wrapped with a compressive wrap with Kerlix from the metatarsal heads up to the tibial tubercle followed by a Coban wrap. Patient was extubated taken to the PACU in  stable condition.  PLAN OF CARE: Admit to inpatient   PATIENT DISPOSITION:  PACU - hemodynamically stable.   Newt Minion, MD 03/18/2014 4:13 PM

## 2014-03-18 NOTE — Progress Notes (Signed)
Utilization review completed.  

## 2014-03-18 NOTE — Anesthesia Preprocedure Evaluation (Signed)
Anesthesia Evaluation  Patient identified by MRN, date of birth, ID band Patient awake    Reviewed: Allergy & Precautions, H&P , NPO status , Patient's Chart, lab work & pertinent test results, reviewed documented beta blocker date and time   History of Anesthesia Complications (+) PONV and history of anesthetic complications  Airway Mallampati: II TM Distance: >3 FB Neck ROM: full    Dental   Pulmonary neg pulmonary ROS, COPD breath sounds clear to auscultation        Cardiovascular hypertension, On Medications and On Home Beta Blockers + Peripheral Vascular Disease negative cardio ROS  + Valvular Problems/Murmurs Rhythm:regular     Neuro/Psych  Neuromuscular disease negative psych ROS   GI/Hepatic Neg liver ROS, GERD-  Medicated and Controlled,  Endo/Other  diabetes, Insulin DependentMorbid obesity  Renal/GU negative Renal ROS  negative genitourinary   Musculoskeletal   Abdominal   Peds  Hematology  (+) Blood dyscrasia, anemia ,   Anesthesia Other Findings See surgeon's H&P   Reproductive/Obstetrics negative OB ROS                           Anesthesia Physical Anesthesia Plan  ASA: III  Anesthesia Plan: General   Post-op Pain Management:    Induction: Intravenous  Airway Management Planned: LMA and Oral ETT  Additional Equipment:   Intra-op Plan:   Post-operative Plan: Extubation in OR  Informed Consent: I have reviewed the patients History and Physical, chart, labs and discussed the procedure including the risks, benefits and alternatives for the proposed anesthesia with the patient or authorized representative who has indicated his/her understanding and acceptance.   Dental Advisory Given  Plan Discussed with: CRNA and Surgeon  Anesthesia Plan Comments:         Anesthesia Quick Evaluation

## 2014-03-18 NOTE — Anesthesia Postprocedure Evaluation (Deleted)
Anesthesia Post Note  Patient: [name]  Procedure(s) Performed: Procedure(s) (LRB): IRRIGATION AND DEBRIDEMENT EXTREMITY (Left)  Anesthesia type: general  Patient location: PACU  Post pain: Pain level controlled  Post assessment: Patient's Cardiovascular Status Stable  Last Vitals:  Filed Vitals:   03/18/14 1041  BP: 132/57  Pulse: 75  Temp: 36.7 C    Post vital signs: Reviewed and stable  Level of consciousness: sedated  Complications: No apparent anesthesia complications

## 2014-03-18 NOTE — Preoperative (Signed)
Beta Blockers   Reason not to administer Beta Blockers:metoprolol given in short stay

## 2014-03-18 NOTE — OR Nursing (Signed)
Dr. Tobias Alexander informed of SB w/ accompanying pertinent assessement. Okay received to tx patient to floor.

## 2014-03-18 NOTE — Anesthesia Postprocedure Evaluation (Signed)
Anesthesia Post Note  Patient: [name]  Procedure(s) Performed: Procedure(s) (LRB): IRRIGATION AND DEBRIDEMENT EXTREMITY (Left)  Anesthesia type: general  Patient location: PACU  Post pain: Pain level controlled  Post assessment: Patient's Cardiovascular Status Stable  Last Vitals:  Filed Vitals:   03/18/14 1445  BP: 143/64  Pulse: 41  Temp:   Resp: 13    Post vital signs: Reviewed and stable  Level of consciousness: sedated  Complications: No apparent anesthesia complications

## 2014-03-18 NOTE — Progress Notes (Signed)
Patient blood glucose 60 pt states was starting to get symptomatic (jittery feeling). Notified dr. Albertina Parr and orders for 1/2 amp of dextrose iv. Will recheck blood glucose shortly.

## 2014-03-18 NOTE — H&P (Signed)
[name] is an 73 y.o. male.   Chief Complaint: Chronic ulcer lateral aspect left calf HPI: Patient is a 73 year old gentleman with chronic venous stasis insufficiency lymphedema and calcification of the subcutaneous tissue of the left leg who has failed prolonged conservative wound care including pneumatic compression noncompressible sleeves as well as compression socks and wound care.  Past Medical History  Diagnosis Date  . COLON POLYP 02/26/2007    Qualifier: Diagnosis of  By: Erling Cruz  MD, MELISSA    . HIATAL HERNIA WITH REFLUX 09/29/2006    Qualifier: Diagnosis of  By: Erling Cruz  MD, MELISSA    . HIP REPLACEMENT, BILATERAL, HX OF 05/12/2007    Qualifier: Diagnosis of  By: Erling Cruz  MD, MELISSA    . OSTEOARTHRITIS 03/05/2007    Qualifier: Diagnosis of  By: Erling Cruz  MD, MELISSA    . VENOUS INSUFFICIENCY, CHRONIC 02/26/2007    Qualifier: Diagnosis of  By: Erling Cruz  MD, MELISSA    . Diabetes mellitus   . Hypertension   . Heart murmur     last 2D Echo -03/31/08  . Complication of anesthesia   . PONV (postoperative nausea and vomiting)   . Prostate cancer   . GERD (gastroesophageal reflux disease)   . History of blood transfusion   . Seasonal allergies     Past Surgical History  Procedure Laterality Date  . Hip arthroplasty      bil  . Leg wound repair / closure      Beads .  Skin wound  . Skin graft      right leg- from donor skin  . Skin graft  1976    R wrist   skin graft from left thigh  . Wrist fusion      right wrist  . Colonoscopy    . Upper gastrointestinal endoscopy    . Leg surgery      ROD for fracture  . Wisdom tooth extraction    . I&d extremity  08/14/2012    Procedure: IRRIGATION AND DEBRIDEMENT EXTREMITY;  Surgeon: Newt Minion, MD;  Location: [facility];  Service: Orthopedics;  Laterality: Right;  Irrigation and Debridement Right tibia, Placement antibiotic beads  . Colonoscopy      History reviewed. No pertinent family history. Social History:  reports that he has never  smoked. He has never used smokeless tobacco. He reports that he does not drink alcohol or use illicit drugs.  Allergies:  Allergies  Allergen Reactions  . Ace Inhibitors     REACTION: dry cough  . Angiotensin Receptor Blockers     REACTION: Caused excessive weight gain  . Lisinopril     REACTION: cough  . Metformin     REACTION: diarrhea    No prescriptions prior to admission    No results found for this or any previous visit (from the past 48 hour(s)). No results found.  Review of Systems  All other systems reviewed and are negative.    There were no vitals taken for this visit. Physical Exam  On examination patient has massive swelling of the left lower extremity there is brawny edema a large ulcer with clear draining fluid. Radiographs shows calcification of the subcutaneous tissue and his leg. Assessment/Plan Assessment: Chronic ulcer lateral aspect left calf with venous insufficiency lymphedema calcification of the subcutaneous tissue of the left calf.  Plan: Will plan for excisional debridement of the wound and application of allograft  skin graft application of a wound VAC. Risks of nonhealing  were discussed patient states he understands and wished to proceed at this time.  Eoin Willden V 03/18/2014, 6:16 AM

## 2014-03-19 LAB — GLUCOSE, CAPILLARY
GLUCOSE-CAPILLARY: 152 mg/dL — AB (ref 70–99)
GLUCOSE-CAPILLARY: 121 mg/dL — AB (ref 70–99)
GLUCOSE-CAPILLARY: 143 mg/dL — AB (ref 70–99)

## 2014-03-19 NOTE — Progress Notes (Signed)
   Subjective:  Patient reports pain as moderate.    Objective:   VITALS:   Filed Vitals:   03/18/14 1540 03/18/14 2153 03/18/14 2235 03/19/14 0605  BP: 152/64 145/56  149/72  Pulse: 53 61 66 69  Temp: 97.8 F (36.6 C) 98.2 F (36.8 C)  98.6 F (37 C)  TempSrc: Oral Oral  Oral  Resp:  18 18 18   Height: 6' (1.829 m)     Weight: 184.16 kg (406 lb)     SpO2: 100% 99% 98% 99%    Neurologically intact VAC in place   Lab Results  Component Value Date   WBC 10.1 03/18/2014   HGB 10.8* 03/18/2014   HCT 33.6* 03/18/2014   MCV 82.4 03/18/2014   PLT 317 03/18/2014     Assessment/Plan: 1 Day Post-Op   Problem List Items Addressed This Visit   None     VAC to be changed by MD Plan to be here through the weekend Expected postop acute blood loss anemia - will monitor for symptoms Up with PT/OT DVT ppx - SCDs, ambulation, asa WBAT right and lower extremity Pain control Discharge planning    Marianna Payment 03/19/2014, 8:59 AM [phone]

## 2014-03-19 NOTE — Evaluation (Signed)
Physical Therapy Evaluation Patient Details Name: [name] MRN: [id number] DOB: [birth year] Today's Date: 03/19/2014 Time: 1400-1430 PT Time Calculation (min): 30 min  PT Assessment / Plan / Recommendation History of Present Illness   Pt. was admitted with chronic ulcer L leg and is s/p I & D.  History of chronic venous stasis insufficiency.  Clinical Impression  Pt. Presents to PT with overall decrease in his functional mobility at baseline, worsened by recent surgery and application of wound vac along with morbid obesity .  Pt. Will benefit from acute PT to address mobility issues to discourage a further functional decline in hospital setting.  Pt. Lives alone.  His Right foot is overall in poor condition and would benefit from ongoing foot care to attempt to prevent future issues.  Discussed this with RN CM Ricki Miller.  Pt. Would benefit from allowing me to lower the height of his crutches but he declines, stating that if they are lowered, he will start falling again. Pt. Is very pleased to be OOB and in bariatric recliner.  Recommend he spend time OOB as much as tolerated to discourage deconditioning.      PT Assessment  Patient needs continued PT services    Follow Up Recommendations  Home health PT;Other (comment) (PT safety eval; pt. needs post acute foot care of feet)    Does the patient have the potential to tolerate intense rehabilitation      Barriers to Discharge        Equipment Recommendations  3in1 (PT);Other (comment) (likely to benefit from 3n1 (bariatric))    Recommendations for Other Services     Frequency Min 3X/week    Precautions / Restrictions Precautions Precautions: Fall Restrictions Weight Bearing Restrictions: Yes LLE Weight Bearing: Weight bearing as tolerated   Pertinent Vitals/Pain See vitals tab Pt. With no complaints of pain      Mobility  Bed Mobility Overal bed mobility: Needs Assistance Bed Mobility: Supine to Sit Supine to sit:  Supervision;HOB elevated General bed mobility comments: pt. managed own self with minimal use of bedrails and HOB about 30 degrees Transfers Overall transfer level: Needs assistance Equipment used: Crutches Transfers: Sit to/from Stand Sit to Stand: Min guard;+2 safety/equipment General transfer comment: Pt. able to transition to stand from sitting with min guard of 2 for safety due to his body habitus (406#).  Pt. stood on both feet then placed crutches in axillae. Ambulation/Gait Ambulation/Gait assistance: Min guard;+2 safety/equipment Ambulation Distance (Feet): 3 Feet Assistive device: Crutches Gait Pattern/deviations: Step-to pattern General Gait Details: Pt. with personal crutches adjusted too high for him by accepted standards.  He states "if I don't have them up this high then I fall".  Pt was not willing to allow therapist to adjust his crutches to a more appropriate level.  He did not want to attempt to use RW in place of crutches    Exercises     PT Diagnosis: Difficulty walking;Generalized weakness  PT Problem List: Decreased strength;Decreased activity tolerance;Decreased balance;Decreased mobility;Decreased knowledge of use of DME;Decreased safety awareness;Decreased knowledge of precautions;Obesity;Impaired sensation PT Treatment Interventions: DME instruction;Gait training;Functional mobility training;Therapeutic activities;Therapeutic exercise;Balance training;Patient/family education     PT Goals(Current goals can be found in the care plan section) Acute Rehab PT Goals Patient Stated Goal: home to his apartment PT Goal Formulation: With patient Time For Goal Achievement: 03/26/14 Potential to Achieve Goals: Good  Visit Information  Last PT Received On: 03/19/14 Assistance Needed: +2 (second person for safety due to pt. 406 #)  History of Present Illness:  Pt. was admitted with chronic ulcer L leg and is s/p I & D.  History of chronic venous stasis insufficiency.        Prior Felicity expects to be discharged to:: Private residence Living Arrangements: Alone Available Help at Discharge: Family;Available PRN/intermittently (daughter) Type of Home: Apartment Home Access: Elevator Home Layout: One level Home Equipment: Crutches Probation officer power chair) Prior Function Level of Independence: Independent with assistive device(s) (uses chair most of time) Communication Communication: No difficulties    Cognition  Cognition Arousal/Alertness: Awake/alert Behavior During Therapy: WFL for tasks assessed/performed Overall Cognitive Status: Within Functional Limits for tasks assessed    Extremity/Trunk Assessment Upper Extremity Assessment Upper Extremity Assessment: Generalized weakness Lower Extremity Assessment Lower Extremity Assessment: Generalized weakness   Balance Balance Overall balance assessment: Modified Independent General Comments General comments (skin integrity, edema, etc.): L foot bandaged and wrapped with coban type wrap and not visible. Right foot noted to be scaley and dry with much dead skin.  Patch of skin near back medial foot with "finger like projections"..  Overall integrity of R lower leg and foot is of concern.    End of Session PT - End of Session Equipment Utilized During Treatment: Gait belt Activity Tolerance: Patient tolerated treatment well;Other (comment) (for limited distance) Patient left: in chair;with call bell/phone within reach (pt. in bariatric recliner with legs elevated and on pillows) Nurse Communication: Mobility status  GP     Ladona Ridgel 03/19/2014, 2:50 PM Gerlean Ren PT Acute Rehab Services [place] [phone]

## 2014-03-20 LAB — GLUCOSE, CAPILLARY
GLUCOSE-CAPILLARY: 144 mg/dL — AB (ref 70–99)
GLUCOSE-CAPILLARY: 119 mg/dL — AB (ref 70–99)
GLUCOSE-CAPILLARY: 157 mg/dL — AB (ref 70–99)
Glucose-Capillary: 142 mg/dL — ABNORMAL HIGH (ref 70–99)

## 2014-03-20 NOTE — Progress Notes (Signed)
Subjective:  Patient reports pain as improved  Objective:   VITALS:   Filed Vitals:   03/19/14 2116 03/19/14 2156 03/20/14 0639 03/20/14 0801  BP:  152/68 136/55   Pulse:  80 73   Temp:  97.9 F (36.6 C) 97.4 F (36.3 C)   TempSrc:  Oral Oral   Resp:  18 18   Height:      Weight:      SpO2: 98% 99% 99% 99%    Neurologically intact VAC in place Dressing c/d/i   Lab Results  Component Value Date   WBC 10.1 03/18/2014   HGB 10.8* 03/18/2014   HCT 33.6* 03/18/2014   MCV 82.4 03/18/2014   PLT 317 03/18/2014     Assessment/Plan: 2 Days Post-Op   Problem List Items Addressed This Visit   None     Continue VAC Up with PT Discharge planning   Marianna Payment 03/20/2014, 9:23 AM [phone]

## 2014-03-21 LAB — GLUCOSE, CAPILLARY
GLUCOSE-CAPILLARY: 110 mg/dL — AB (ref 70–99)
GLUCOSE-CAPILLARY: 78 mg/dL (ref 70–99)
Glucose-Capillary: 114 mg/dL — ABNORMAL HIGH (ref 70–99)
Glucose-Capillary: 151 mg/dL — ABNORMAL HIGH (ref 70–99)
Glucose-Capillary: 127 mg/dL — ABNORMAL HIGH (ref 70–99)

## 2014-03-21 NOTE — Progress Notes (Signed)
Patient ID: [name], male   DOB: 1941/11/11, 73 y.o.   MRN: [id number] Postoperative day 3 status post skin graft left leg with compression wrap and wound VAC. Patient is comfortable. Minimal drainage from the wound VAC. Plan for discharge to home tomorrow. Will change dressing in the morning.

## 2014-03-21 NOTE — Progress Notes (Signed)
Physical Therapy Treatment Patient Details Name: [name] MRN: [id number] DOB: 1941-06-05 Today's Date: 03/21/2014 Time: 3785-8850 PT Time Calculation (min): 13 min  PT Assessment / Plan / Recommendation  History of Present Illness  Pt. was admitted with chronic ulcer L leg and is s/p I & D.  History of chronic venous stasis insufficiency.   PT Comments   Pt is progressing well towards physical therapy goals, ambulating up to 75 feet with min guard. Pt will benefit from continued home health physical therapy for increasing his level of independence with functional mobility. Will continue to follow until d/c.   Follow Up Recommendations  Home health PT;Other (comment) (PT safety eval; pt. needs post acute foot care of feet)     Does the patient have the potential to tolerate intense rehabilitation     Barriers to Discharge        Equipment Recommendations  3in1 (PT);Other (comment) (likely to benefit from 3n1 (bariatric))    Recommendations for Other Services    Frequency Min 3X/week   Progress towards PT Goals Progress towards PT goals: Progressing toward goals  Plan Current plan remains appropriate    Precautions / Restrictions Precautions Precautions: Fall Restrictions Weight Bearing Restrictions: Yes LLE Weight Bearing: Weight bearing as tolerated   Pertinent Vitals/Pain 0/10 pain Pt repositioned in chair for comfort    Mobility  Bed Mobility Overal bed mobility: Needs Assistance Bed Mobility: Supine to Sit Supine to sit: Supervision;HOB elevated General bed mobility comments: pt. managed own self with minimal use of bedrails and HOB about 30 degrees Transfers Overall transfer level: Needs assistance Equipment used: Crutches Transfers: Sit to/from Stand Sit to Stand: Supervision General transfer comment: sit>stand with supervision using crutches for support safely. PT managed wound vac. Ambulation/Gait Ambulation/Gait assistance: Min guard Ambulation  Distance (Feet): 75 Feet Assistive device: Crutches Gait Pattern/deviations: Step-through pattern (4 point gait pattern with crutches) General Gait Details: Min guard for safety. Pt uses crutches that are adjusted higher than recommended but he states this is how he prevents himself from falling. Verbal cues provided for crutch placement closer to BOS for safety.    Exercises General Exercises - Lower Extremity Ankle Circles/Pumps: AROM;Both;10 reps;Seated   PT Diagnosis:    PT Problem List:   PT Treatment Interventions:     PT Goals (current goals can now be found in the care plan section) Acute Rehab PT Goals PT Goal Formulation: With patient Time For Goal Achievement: 03/26/14 Potential to Achieve Goals: Good  Visit Information  Last PT Received On: 03/21/14 Assistance Needed: +1 History of Present Illness:  Pt. was admitted with chronic ulcer L leg and is s/p I & D.  History of chronic venous stasis insufficiency.    Subjective Data  Subjective: Pt states he sat in the reclining chair most of the weekend, he is willing to work with therapy   Cognition  Cognition Arousal/Alertness: Awake/alert Behavior During Therapy: WFL for tasks assessed/performed Overall Cognitive Status: Within Functional Limits for tasks assessed    Balance  General Comments General comments (skin integrity, edema, etc.): L foot bandaged and wrapped with coban type wrap and not visible. Right foot noted to be scaley and dry with much dead skin.   End of Session PT - End of Session Activity Tolerance: Patient tolerated treatment well Patient left: in chair;with call bell/phone within reach Nurse Communication: Mobility status   GP    [place], [place]  Ellouise Newer 03/21/2014, 11:15 AM

## 2014-03-22 ENCOUNTER — Encounter (HOSPITAL_COMMUNITY): Payer: Self-pay | Admitting: Orthopedic Surgery

## 2014-03-22 LAB — GLUCOSE, CAPILLARY
GLUCOSE-CAPILLARY: 61 mg/dL — AB (ref 70–99)
GLUCOSE-CAPILLARY: 71 mg/dL (ref 70–99)
Glucose-Capillary: 81 mg/dL (ref 70–99)
Glucose-Capillary: 111 mg/dL — ABNORMAL HIGH (ref 70–99)

## 2014-03-22 MED ORDER — BISACODYL 10 MG RE SUPP
10.0000 mg | Freq: Once | RECTAL | Status: DC
  Filled 2014-03-22: qty 1

## 2014-03-22 NOTE — Progress Notes (Signed)
Hypoglycemic Event  CBG: 61  Treatment: 15 GM carbohydrate snack  Symptoms: None  Follow-up CBG: Time:0345 CBG Result:71    Possible Reasons for Event: Unknown  Comments/MD notified:Checked per pt request due to 75 units of lantus administered at 2200 and CBG being 127 at the time. Pt did not experience any hypoglycemic symptoms and says that he feels it when its around 50 usually.    Bunnie Rehberg R  Remember to initiate Hypoglycemia Order Set & complete

## 2014-03-22 NOTE — Care Management Note (Signed)
CARE MANAGEMENT NOTE 03/22/2014  Patient:  [name], [name]   Account Number:  [id number]  Date Initiated:  03/18/2014  Documentation initiated by:  Ricki Miller  Subjective/Objective Assessment:   73 yr old male s/p I & D of left leg ulcer with skin graft and wound vac placement.     Action/Plan:   Case Manager will follow.  PT suggested HHPT, CM spoke with patient and he politely declines, states he doesnt feel he needs it. CM ordered bariatric 3in1, to be delivered to patient's home.   Anticipated DC Date:  03/22/2014   Anticipated DC Plan:  [place]  CM consult      PAC Choice  DURABLE MEDICAL EQUIPMENT   Choice offered to / List presented to:     DME arranged  3-N-1      DME agency  [place].        Status of service:  Completed, signed off Medicare Important Message given?   (If response is "NO", the following Medicare IM given date fields will be blank) Date Medicare IM given:   Date Additional Medicare IM given:    Discharge Disposition:  HOME/SELF CARE

## 2014-03-22 NOTE — Discharge Summary (Signed)
Physician Discharge Summary  Patient ID: [name] MRN: [id number] DOB/AGE: 1941-09-02 73 y.o.  Admit date: 03/18/2014 Discharge date: 03/22/2014  Admission Diagnoses: Chronic ulcer left leg with venous stasis insufficiency and lymphedema  Discharge Diagnoses: Same Active Problems:   Leg ulcer, left   Discharged Condition: stable  Hospital Course: Patient's hospital course was essentially unremarkable. He underwent allograft skin graft with compressive wrap and wound VAC. The wound VAC was discontinued patient's ulcer was stable the skin was wrinkling well and a new compression wrap was reapplied  Consults: None  Significant Diagnostic Studies: labs: Routine labs  Treatments: surgery: See operative note  Discharge Exam: Blood pressure 121/54, pulse 72, temperature 98.7 F (37.1 C), temperature source Oral, resp. rate 18, height 6' (1.829 m), weight 184.16 kg (406 lb), SpO2 98.00%. Incision/Wound: skin graft clean dry and intact  Disposition: 01-Home or Self Care  Discharge Orders   Future Orders Complete By Expires   Call MD / Call 911  As directed    Comments:     If you experience chest pain or shortness of breath, CALL 911 and be transported to the hospital emergency room.  If you develope a fever above 101 F, pus (white drainage) or increased drainage or redness at the wound, or calf pain, call your surgeon's office.   Constipation Prevention  As directed    Comments:     Drink plenty of fluids.  Prune juice may be helpful.  You may use a stool softener, such as Colace (over the counter) 100 mg twice a day.  Use MiraLax (over the counter) for constipation as needed.   Diet - low sodium heart healthy  As directed    Increase activity slowly as tolerated  As directed        Medication List         albuterol 108 (90 BASE) MCG/ACT inhaler  Commonly known as:  PROVENTIL HFA;VENTOLIN HFA  Inhale 2 puffs into the lungs every 4 (four) hours as needed for wheezing or  shortness of breath.     aspirin 81 MG chewable tablet  Chew 81 mg by mouth daily.     cyclobenzaprine 10 MG tablet  Commonly known as:  FLEXERIL  Take 1 tablet (10 mg total) by mouth 3 (three) times daily as needed (muscle pain).     doxycycline 100 MG tablet  Commonly known as:  VIBRA-TABS  Take 100 mg by mouth 2 (two) times daily.     esomeprazole 40 MG capsule  Commonly known as:  NEXIUM  Take 40 mg by mouth 2 (two) times daily before a meal.     fluticasone 0.05 % cream  Commonly known as:  CUTIVATE  Apply 1 application topically 2 (two) times daily.     fluticasone 220 MCG/ACT inhaler  Commonly known as:  FLOVENT HFA  Inhale 1 puff into the lungs 2 (two) times daily.     furosemide 40 MG tablet  Commonly known as:  LASIX  Take 40 mg by mouth 2 (two) times daily.     glucose blood test strip  Use as instructed     insulin glargine 100 UNIT/ML injection  Commonly known as:  LANTUS  Inject 75 Units into the skin 2 (two) times daily.     latanoprost 0.005 % ophthalmic solution  Commonly known as:  XALATAN  Place 1 drop into both eyes at bedtime.     metoprolol 100 MG tablet  Commonly known as:  LOPRESSOR  Take  100 mg by mouth 2 (two) times daily.     mometasone 50 MCG/ACT nasal spray  Commonly known as:  NASONEX  Place 2 sprays into the nose daily.     mupirocin ointment 2 %  Commonly known as:  BACTROBAN  Apply 1 application topically 2 (two) times daily.     NOVOLOG FLEXPEN 100 UNIT/ML FlexPen  Generic drug:  insulin aspart  Inject 20 Units into the skin 2 (two) times daily before lunch and supper.     ONE TOUCH LANCETS Misc  Check blood sugar twice a day.     ONE TOUCH ULTRA SYSTEM KIT W/DEVICE Kit  1 kit by Does not apply route once.     pravastatin 40 MG tablet  Commonly known as:  PRAVACHOL  Take 1 tablet (40 mg total) by mouth daily.     sennosides-docusate sodium 8.6-50 MG tablet  Commonly known as:  SENOKOT-S  Take 1 tablet by mouth daily  as needed for constipation.     silver sulfADIAZINE 1 % cream  Commonly known as:  SILVADENE  Apply 1 application topically daily as needed (wound care).     tamsulosin 0.4 MG Caps capsule  Commonly known as:  FLOMAX  Take 0.8 mg by mouth daily after breakfast.     traMADol 50 MG tablet  Commonly known as:  ULTRAM  Take 50 mg by mouth 2 (two) times daily as needed for moderate pain.     triamterene-hydrochlorothiazide 37.5-25 MG per tablet  Commonly known as:  MAXZIDE-25  Take 1 tablet by mouth daily.           Follow-up Information   Follow up with Brayleigh Rybacki V, MD In 1 week.   Specialty:  Orthopedic Surgery   Contact information:   [place] [address] [phone]       Signed: Newt Minion 03/22/2014, 6:37 AM

## 2014-03-22 NOTE — Progress Notes (Signed)
D/C instructions given to Pt. He verbalized understanding of his discharge care. Pt was assisted to wheelchair and downstairs to [facility] bus to transport home.

## 2014-03-22 NOTE — Discharge Instructions (Signed)
Keep foot elevated above the heart at all times left leg

## 2014-04-01 ENCOUNTER — Other Ambulatory Visit: Payer: Self-pay | Admitting: Family Medicine

## 2014-05-05 ENCOUNTER — Other Ambulatory Visit: Payer: Self-pay | Admitting: Family Medicine

## 2014-05-09 ENCOUNTER — Encounter: Payer: Self-pay | Admitting: Family Medicine

## 2014-05-09 ENCOUNTER — Encounter (INDEPENDENT_AMBULATORY_CARE_PROVIDER_SITE_OTHER): Payer: Medicare Other | Admitting: Home Health Services

## 2014-05-09 ENCOUNTER — Ambulatory Visit (INDEPENDENT_AMBULATORY_CARE_PROVIDER_SITE_OTHER): Payer: Medicare Other | Admitting: Family Medicine

## 2014-05-09 VITALS — BP 121/61 | HR 57 | Temp 97.6°F | Wt >= 6400 oz

## 2014-05-09 DIAGNOSIS — T148XXA Other injury of unspecified body region, initial encounter: Secondary | ICD-10-CM

## 2014-05-09 DIAGNOSIS — M86669 Other chronic osteomyelitis, unspecified tibia and fibula: Secondary | ICD-10-CM

## 2014-05-09 DIAGNOSIS — E119 Type 2 diabetes mellitus without complications: Secondary | ICD-10-CM

## 2014-05-09 DIAGNOSIS — Z0000 Encounter for general adult medical examination without abnormal findings: Secondary | ICD-10-CM

## 2014-05-09 DIAGNOSIS — Z23 Encounter for immunization: Secondary | ICD-10-CM

## 2014-05-09 DIAGNOSIS — L97929 Non-pressure chronic ulcer of unspecified part of left lower leg with unspecified severity: Secondary | ICD-10-CM

## 2014-05-09 DIAGNOSIS — IMO0001 Reserved for inherently not codable concepts without codable children: Secondary | ICD-10-CM

## 2014-05-09 DIAGNOSIS — E1151 Type 2 diabetes mellitus with diabetic peripheral angiopathy without gangrene: Secondary | ICD-10-CM

## 2014-05-09 DIAGNOSIS — L97909 Non-pressure chronic ulcer of unspecified part of unspecified lower leg with unspecified severity: Secondary | ICD-10-CM

## 2014-05-09 DIAGNOSIS — IMO0002 Reserved for concepts with insufficient information to code with codable children: Secondary | ICD-10-CM

## 2014-05-09 DIAGNOSIS — E785 Hyperlipidemia, unspecified: Secondary | ICD-10-CM

## 2014-05-09 DIAGNOSIS — E1165 Type 2 diabetes mellitus with hyperglycemia: Principal | ICD-10-CM

## 2014-05-09 DIAGNOSIS — I798 Other disorders of arteries, arterioles and capillaries in diseases classified elsewhere: Secondary | ICD-10-CM

## 2014-05-09 DIAGNOSIS — E1159 Type 2 diabetes mellitus with other circulatory complications: Secondary | ICD-10-CM

## 2014-05-09 LAB — LIPID PANEL
CHOLESTEROL: 131 mg/dL (ref 0–200)
HDL: 39 mg/dL — AB (ref >39)
LDL Cholesterol: 83 mg/dL (ref 0–99)
TRIGLYCERIDES: 45 mg/dL (ref <150)
Total CHOL/HDL Ratio: 3.4 Ratio
VLDL: 9 mg/dL (ref 0–40)

## 2014-05-09 LAB — POCT GLYCOSYLATED HEMOGLOBIN (HGB A1C): Hemoglobin A1C: 7.6

## 2014-05-09 NOTE — Assessment & Plan Note (Signed)
Cont with Dr. Sharol Given, give tetanus booster today

## 2014-05-09 NOTE — Progress Notes (Signed)
Encounter opened in error

## 2014-05-09 NOTE — Assessment & Plan Note (Signed)
Check lipid profile today. 

## 2014-05-09 NOTE — Patient Instructions (Signed)
Dear [name],   Thank you for coming to clinic today. Please read below regarding the issues that we discussed.   1. Diabetes - Let's try to levemir for now. It is very similar to the Lantus and should work as well. If it is not as good, then we can go back.   2. Leg Wound - Please get all medications for the wounds, including mupirocin ointment, from Dr. Sharol Given since he manages this issue for you.   3. Cholesterol - I will let you know what your cholesterol level is.   Please follow up in clinic in 3 months. I will no longer be here, and you will be assigned to a new doctor. It has been a pleasure getting to know you during the past 3 years. Please call earlier if you have any questions or concerns.   Sincerely,   Dr. Maricela Bo

## 2014-05-09 NOTE — Assessment & Plan Note (Signed)
Given the pt sees Dr. Sharol Given weekly, I recommend that all refills of mupirocin or other ointments be prescribed by Dr. Sharol Given. The patient voiced understanding.

## 2014-05-09 NOTE — Assessment & Plan Note (Signed)
A: Improved a1c to 7.6, no evidence of renal dysfunction or retinopathy at last check in February P:  - Start levemir 75 units BID in place of lantus for insurance reasons - Cont novolog 20 units BID - Encouraged exercise

## 2014-05-09 NOTE — Progress Notes (Signed)
Patient ID: [name], male   DOB: Mar 27, 1941, 73 y.o.   MRN: [id number]  Subjective:  73 year old very pleasant gentleman who presents today for follow up.   1. Diabetes  Recent Issues:  Hypoglycemia: No   Medication Compliance: Diabetes medication: Yes - Lantus 75 units BID but patient recently given Levemir by pharmacy due to preference switch with insurance, novolog 20 units BID Taking ACE-I: No - cough Taking statin: Yes - only moderate intensity and no LDL check since 2012  Behavioral: Home CBG Monitoring: Yes Diet changes: Yes Exercise: No  Health Maintenance: Visual problems: Yes, blurriness  Last eye exam: February 2015  Last dental visit: unknown  Foot ulcers: Yes, but chronic leg wounds being treated by Dr. Sharol Given, orthopedic surgeon  Pneumonia vaccination: had 23 valent, needs 13-valent today  2. Leg Infection: pt with chronic osteomyelitis of RLE that is being treated by Dr. Sharol Given; he believes that is is stable but notes swelling of the leg   Current Outpatient Prescriptions on File Prior to Visit  Medication Sig Dispense Refill  . albuterol (PROVENTIL HFA;VENTOLIN HFA) 108 (90 BASE) MCG/ACT inhaler Inhale 2 puffs into the lungs every 4 (four) hours as needed for wheezing or shortness of breath.      Marland Kitchen aspirin 81 MG chewable tablet Chew 81 mg by mouth daily.       . Blood Glucose Monitoring Suppl (ONE TOUCH ULTRA SYSTEM KIT) W/DEVICE KIT 1 kit by Does not apply route once.  1 each  0  . cyclobenzaprine (FLEXERIL) 10 MG tablet Take 1 tablet (10 mg total) by mouth 3 (three) times daily as needed (muscle pain).  30 tablet  0  . doxycycline (VIBRA-TABS) 100 MG tablet Take 100 mg by mouth 2 (two) times daily.      Marland Kitchen esomeprazole (NEXIUM) 40 MG capsule Take 40 mg by mouth 2 (two) times daily before a meal.      . fluticasone (CUTIVATE) 0.05 % cream Apply 1 application topically 2 (two) times daily.      . fluticasone (FLOVENT HFA) 220 MCG/ACT inhaler Inhale 1 puff into  the lungs 2 (two) times daily.      . furosemide (LASIX) 40 MG tablet Take 40 mg by mouth 2 (two) times daily.      Marland Kitchen glucose blood test strip Use as instructed  100 each  12  . insulin aspart (NOVOLOG FLEXPEN) 100 UNIT/ML FlexPen Inject 20 Units into the skin 2 (two) times daily before lunch and supper.      . insulin glargine (LANTUS) 100 UNIT/ML injection Inject 75 Units into the skin 2 (two) times daily.      Marland Kitchen latanoprost (XALATAN) 0.005 % ophthalmic solution Place 1 drop into both eyes at bedtime.      . metoprolol (LOPRESSOR) 100 MG tablet Take 100 mg by mouth 2 (two) times daily.      . mometasone (NASONEX) 50 MCG/ACT nasal spray Place 2 sprays into the nose daily.  17 g  12  . mupirocin ointment (BACTROBAN) 2 % Apply 1 application topically 2 (two) times daily.      . ONE TOUCH LANCETS MISC Check blood sugar twice a day.  200 each  12  . pravastatin (PRAVACHOL) 40 MG tablet Take 1 tablet (40 mg total) by mouth daily.  90 tablet  3  . sennosides-docusate sodium (SENOKOT-S) 8.6-50 MG tablet Take 1 tablet by mouth daily as needed for constipation.  60 tablet  2  .  silver sulfADIAZINE (SILVADENE) 1 % cream Apply 1 application topically daily as needed (wound care).      . tamsulosin (FLOMAX) 0.4 MG CAPS capsule Take 0.8 mg by mouth daily after breakfast.      . traMADol (ULTRAM) 50 MG tablet Take 50 mg by mouth 2 (two) times daily as needed for moderate pain.      Marland Kitchen triamterene-hydrochlorothiazide (MAXZIDE-25) 37.5-25 MG per tablet Take 1 tablet by mouth daily.       No current facility-administered medications on file prior to visit.      Review of Systems:  Constitutional: negative for chills, fatigue and fevers Ears, nose, mouth, throat, and face: negative for hearing loss and snoring Respiratory: negative for dyspnea on exertion Cardiovascular: negative Musculoskeletal:negative for arthralgias and back pain     Objective:   Physical Exam: BP 121/61  Pulse 57  Temp(Src) 97.6  F (36.4 C) (Oral)  Wt 433 lb (196.408 kg) Wt Readings from Last 5 Encounters:  05/09/14 433 lb (196.408 kg)  03/18/14 406 lb (184.16 kg)  03/18/14 406 lb (184.16 kg)  02/07/14 412 lb (186.882 kg)  06/18/13 340 lb (154.223 kg)      General: eldlery AAM, super morbidly obese, sitting in motortized wheelchair, non distressed, very pleasant  Eyes: deferred Cardiovascular: RRR, nl S1 and S2 Pulmonary: clear to auscultation bilaterally RLE: posterior ulcer 2 cm x 2 cm LLE: wrapped in an una boot  Labs:   Diabetic Labs:  Lab Results  Component Value Date   HGBA1C 7.6 05/09/2014   HGBA1C 8.4 02/07/2014   HGBA1C 7.0 11/04/2013   Lab Results  Component Value Date   MICROALBUR 2.72* 02/07/2014   LDLCALC 78 04/17/2011   CREATININE 0.82 03/18/2014   Last microalbumin: Lab Results  Component Value Date   MICROALBUR 2.72* 02/07/2014        Assessment & Plan:

## 2014-05-09 NOTE — Progress Notes (Signed)
Patient here for annual wellness visit, patient reports: Risk Factors/Conditions needing evaluation or treatment: Pt.does not have any risk factors that need evaluation.  Home Safety: Pt lives at home, by self in a 2nd floor apartment.Pt reports having smoke alarms and has adaptive equipment.  Other Information: Corrective lens: Pt wears corrective lens for reading, does not have regular eye exams. Dentures: Pt does not have dentures, and does not have regular dental exams. Memory: Pt denies memory problems.  Patient's Mini Mental Score (recorded in doc. flowsheet): 29 Bladder:  Pt denies problems with bladder control.  BMI/Exercise: We discussed BMI and strategies for weight loss including portion sizes and eating more fruits and vegetables.  We also discussed  starting a regular exercise routine, such as chair exercises. Med Adherence:  We discussed importance of taking medications for htn, dm, and cholesterol.  Pt reports missing 1 day in the past week.  ADL/IADL:  Pt reports independence in all functions but struggles with bathing and home management by self. Balance/Gait: Pt reports 0 falls in the past year.  We discussed home safety and fall prevention.  Pt uses automatic wheelchair for ambulation.      Annual Wellness Visit Requirements Recorded Today In  Medical, family, social history Past Medical, Family, Social History Section  Current providers Care team  Current medications Medications  Wt, BP, Ht, BMI Vital signs  Hearing assessment (welcome visit) Hearing/vision  Tobacco, alcohol, illicit drug use History  ADL Nurse Assessment  Depression Screening Nurse Assessment  Cognitive impairment Nurse Assessment  Mini Mental Status Document Flowsheet  Fall Risk Fall/Depression  Home Safety Progress Note  End of Life Planning (welcome visit) Social Documentation  Medicare preventative services Progress Note  Risk factors/conditions needing evaluation/treatment Progress Note   Personalized health advice Patient Instructions, goals, letter  Diet & Exercise Social Documentation  Emergency Contact Social Documentation  Seat Belts Social Documentation  Sun exposure/protection Social Documentation

## 2014-05-10 ENCOUNTER — Encounter: Payer: Self-pay | Admitting: Family Medicine

## 2014-05-24 ENCOUNTER — Telehealth: Payer: Self-pay | Admitting: Family Medicine

## 2014-05-24 NOTE — Telephone Encounter (Signed)
Insurance company called and would ike Jonnathan's last lab results for their files. Please call [phone] ext [phone]. jw

## 2014-05-25 NOTE — Telephone Encounter (Signed)
Spoke with Clarene Critchley with [facility], she is requesting that the result from the most recent A1C be faxed to her at [phone]. I told her once Mr Rothermel signed the ROI I will fax over the results.Jaymes Graff Busick

## 2014-05-25 NOTE — Telephone Encounter (Signed)
Left message for [name] with patient's insurance company to return call. I spoke with patient concerning faxing his lab work to them and he is ok with this but I will need a ROI before anything can be faxed to them. I asked Mr [name] to come in and sign the ROI, once he has done that I will fax the lab results to his insurance company. Please let [name] aware of this when she calls back.Jaymes Graff [place] [facility]

## 2014-06-03 ENCOUNTER — Other Ambulatory Visit: Payer: Self-pay | Admitting: Family Medicine

## 2014-06-10 ENCOUNTER — Telehealth: Payer: Self-pay | Admitting: Family Medicine

## 2014-06-10 NOTE — Telephone Encounter (Signed)
Pt called and needs a prescription for a battery for his wheelchair sent to [facility] [phone]. This must include the diagnose code for his insurance to pay. jw

## 2014-06-10 NOTE — Telephone Encounter (Signed)
Forward to PCP for order.Busick, Kevin Fenton

## 2014-06-12 NOTE — Telephone Encounter (Signed)
Please call patient and ask when this needs to be completed? I will be back on Friday 6/19. If he needs it earlier, please ask Joel Graham, MD to write a prescription.

## 2014-06-13 NOTE — Telephone Encounter (Signed)
Called patient and he needs the battery asap, his is dead. Will forward to Dr Otis Dials to write the Rx.Kade Rickels, Kevin Fenton

## 2014-06-14 NOTE — Telephone Encounter (Signed)
Please let patient know that I wrote for a prescription for 1 wheelchair battery no refills, ICD9: 730.16 chronic osteomyelitis of left leg.  Am faxing it to [facility].   Thank you!  Liam Graham, PGY-3 Family Medicine Resident

## 2014-06-14 NOTE — Telephone Encounter (Signed)
Called and informed patient that the Rx was faxed in and that they should deliver his battery.Busick, Kevin Fenton

## 2014-06-30 ENCOUNTER — Other Ambulatory Visit: Payer: Self-pay | Admitting: Family Medicine

## 2014-06-30 NOTE — Telephone Encounter (Signed)
Refilled pen needles and albuterol while PCP is out.   Laroy Apple, MD [place] Resident, PGY-3 06/30/2014, 1:57 PM

## 2014-06-30 NOTE — Telephone Encounter (Signed)
Refilled flovent while PCP is out.   Laroy Apple, MD [place] Resident, PGY-3 06/30/2014, 1:55 PM

## 2014-08-01 ENCOUNTER — Other Ambulatory Visit: Payer: Self-pay | Admitting: *Deleted

## 2014-08-01 DIAGNOSIS — I5042 Chronic combined systolic (congestive) and diastolic (congestive) heart failure: Secondary | ICD-10-CM

## 2014-08-03 MED ORDER — FUROSEMIDE 40 MG PO TABS
40.0000 mg | ORAL_TABLET | Freq: Two times a day (BID) | ORAL | Status: DC
Start: ? — End: 2014-08-26

## 2014-08-26 ENCOUNTER — Other Ambulatory Visit: Payer: Self-pay | Admitting: Family Medicine

## 2014-08-27 MED ORDER — INSULIN ASPART 100 UNIT/ML FLEXPEN: PEN_INJECTOR | SUBCUTANEOUS | Status: DC

## 2014-08-27 MED ORDER — SILVER SULFADIAZINE 1 % EX CREA: 1.0000 "application " | TOPICAL_CREAM | Freq: Every day | CUTANEOUS | Status: DC | PRN

## 2014-08-27 MED ORDER — LATANOPROST 0.005 % OP SOLN: 1.0000 [drp] | Freq: Every day | OPHTHALMIC | Status: DC

## 2014-09-09 ENCOUNTER — Ambulatory Visit (INDEPENDENT_AMBULATORY_CARE_PROVIDER_SITE_OTHER): Payer: Medicare Other | Admitting: Family Medicine

## 2014-09-09 ENCOUNTER — Encounter: Payer: Self-pay | Admitting: Family Medicine

## 2014-09-09 VITALS — BP 168/82

## 2014-09-09 DIAGNOSIS — L97909 Non-pressure chronic ulcer of unspecified part of unspecified lower leg with unspecified severity: Secondary | ICD-10-CM

## 2014-09-09 DIAGNOSIS — Z7409 Other reduced mobility: Secondary | ICD-10-CM | POA: Insufficient documentation

## 2014-09-09 DIAGNOSIS — L97929 Non-pressure chronic ulcer of unspecified part of left lower leg with unspecified severity: Secondary | ICD-10-CM

## 2014-09-09 DIAGNOSIS — R69 Illness, unspecified: Secondary | ICD-10-CM

## 2014-09-09 NOTE — Assessment & Plan Note (Signed)
I fully support his application for a replacement power wheel chair.  I believe he meets all the criteria.

## 2014-09-09 NOTE — Assessment & Plan Note (Signed)
Managed by Dr. Sharol Given.  Will need left leg lift for power wheel chair.

## 2014-09-09 NOTE — Patient Instructions (Signed)
I fully support your need for a power wheel chair. I will complete the documentation, write a prescription and fax to the wheel chair company next week. You got a flu shot today.  Keep seeing Dr. Sharol Given for you leg ulcers.

## 2014-09-09 NOTE — Progress Notes (Signed)
   Subjective:    Patient ID: [name], male    DOB: 03/03/1941, 73 y.o.   MRN: [id number]  HPI Mobility exam for power wheel chair.  Because of chronic left leg venous osteomyelitis and chronic venous insufficiency ulcer treated by Dr. Sharol Given, he also needs a left leg lift on the chair. Has had a power scooter for ~10 years.  Because of weight and orthopedic problems can no longer get on and off scooter.  He is using a power wheel chair given to him by a friend.  Medical diagnoses include Morbid obesity, chronic osteomyelitis Left leg with ucler, diabetes type 2 and diabetic neuropathy.  Can only do transfers and needs crutch for that cannot walk 5 feet.  Uses power wheelchair for all ADLs:i.e. To get to bathroom to toilet and bathe, to get to kitchen to prepare his own meals (he lives alone).    Cannot use manual wheel chair because he has a fused right wrist.  Also does not have the arm strength for his body weight.  Cannot use scooter because cannot straddle scooter and scooter does not allow elevation of left leg.  Patient has the dexterity and normal intellect to safely operate power wheelchair.  Home has room for power wheelchair to operate.  Patient is willing and highly motivated to properly use power wheel chair.  Has handicapped apartment which allows him access.       Review of Systems     Objective:   Physical Exam Upper extremity strength 4/5 strength right and left upper extremity.  Right wrist is fuse.  No rom. Limited rom of left leg.  Ulcer large post calf left leg.  Smaller ulcer post calf Right leg.    3+ edema both lower ext.  Unable to ambulate at all safely.   Unable to stand for height or weight today.  Last weight by [place] was 433 on 05/09/14.  Patient believes he has gained wt since then.  Last height by [place] was 6'0" on 03/18/14 Cognition grossly normal       Assessment & Plan:

## 2014-09-22 ENCOUNTER — Ambulatory Visit (INDEPENDENT_AMBULATORY_CARE_PROVIDER_SITE_OTHER): Payer: Medicare Other | Admitting: *Deleted

## 2014-09-22 VITALS — Wt >= 6400 oz

## 2014-09-22 DIAGNOSIS — Z0000 Encounter for general adult medical examination without abnormal findings: Secondary | ICD-10-CM

## 2014-09-22 NOTE — Progress Notes (Signed)
   Pt in nurse clinic for weight check to complete paper work for wheelchair.  Will forward to PCP.  Weight today 431.7 lb.  Derl Barrow, RN

## 2014-09-28 ENCOUNTER — Telehealth: Payer: Self-pay | Admitting: Family Medicine

## 2014-09-28 NOTE — Telephone Encounter (Signed)
[employer] called to check the status of paperwork that needs to be signed off on before patient can receive his wheelchair. Please call [phone] and use reference number [id number] if you have any questions. jw

## 2014-09-28 NOTE — Telephone Encounter (Signed)
Form faxed to [facility] at [phone].  Derl Barrow, RN

## 2014-10-14 ENCOUNTER — Other Ambulatory Visit: Payer: Self-pay | Admitting: *Deleted

## 2014-10-14 ENCOUNTER — Other Ambulatory Visit: Payer: Self-pay | Admitting: Family Medicine

## 2014-10-14 DIAGNOSIS — E118 Type 2 diabetes mellitus with unspecified complications: Secondary | ICD-10-CM

## 2014-10-14 DIAGNOSIS — N400 Enlarged prostate without lower urinary tract symptoms: Secondary | ICD-10-CM

## 2014-10-14 DIAGNOSIS — I10 Essential (primary) hypertension: Secondary | ICD-10-CM

## 2014-10-14 DIAGNOSIS — K219 Gastro-esophageal reflux disease without esophagitis: Secondary | ICD-10-CM

## 2014-10-14 NOTE — Telephone Encounter (Signed)
Also received a request for Levemir.  Levemir is not list on current medication list.  Derl Barrow, RN

## 2014-10-16 ENCOUNTER — Other Ambulatory Visit: Payer: Self-pay | Admitting: Family Medicine

## 2014-10-17 NOTE — Telephone Encounter (Signed)
Spoke with patient and informed him of below. He will call back to schedule an appointment himself due to him being busy at the moment.

## 2014-10-21 ENCOUNTER — Other Ambulatory Visit: Payer: Self-pay | Admitting: Family Medicine

## 2014-10-21 DIAGNOSIS — L97309 Non-pressure chronic ulcer of unspecified ankle with unspecified severity: Secondary | ICD-10-CM

## 2014-10-21 DIAGNOSIS — M7989 Other specified soft tissue disorders: Secondary | ICD-10-CM

## 2014-10-24 ENCOUNTER — Other Ambulatory Visit: Payer: Self-pay | Admitting: *Deleted

## 2014-10-24 DIAGNOSIS — E1151 Type 2 diabetes mellitus with diabetic peripheral angiopathy without gangrene: Secondary | ICD-10-CM

## 2014-10-24 DIAGNOSIS — I10 Essential (primary) hypertension: Secondary | ICD-10-CM

## 2014-10-24 MED ORDER — METOPROLOL TARTRATE 100 MG PO TABS: 100.0000 mg | ORAL_TABLET | Freq: Two times a day (BID) | ORAL | Status: DC

## 2014-10-24 MED ORDER — GLUCOSE BLOOD VI STRP: ORAL_STRIP | Status: DC

## 2014-10-24 MED ORDER — ONETOUCH LANCETS MISC: Status: DC

## 2014-11-11 ENCOUNTER — Encounter: Payer: Self-pay | Admitting: Family Medicine

## 2014-11-11 ENCOUNTER — Other Ambulatory Visit: Payer: Self-pay | Admitting: Family Medicine

## 2014-11-11 ENCOUNTER — Ambulatory Visit (INDEPENDENT_AMBULATORY_CARE_PROVIDER_SITE_OTHER): Payer: Medicare Other | Admitting: Family Medicine

## 2014-11-11 VITALS — BP 170/75 | HR 68 | Temp 98.2°F | Ht 72.0 in | Wt >= 6400 oz

## 2014-11-11 DIAGNOSIS — G8929 Other chronic pain: Secondary | ICD-10-CM

## 2014-11-11 DIAGNOSIS — E1151 Type 2 diabetes mellitus with diabetic peripheral angiopathy without gangrene: Secondary | ICD-10-CM

## 2014-11-11 DIAGNOSIS — I10 Essential (primary) hypertension: Secondary | ICD-10-CM

## 2014-11-11 DIAGNOSIS — J449 Chronic obstructive pulmonary disease, unspecified: Secondary | ICD-10-CM

## 2014-11-11 DIAGNOSIS — I798 Other disorders of arteries, arterioles and capillaries in diseases classified elsewhere: Secondary | ICD-10-CM

## 2014-11-11 LAB — POCT GLYCOSYLATED HEMOGLOBIN (HGB A1C): Hemoglobin A1C: 8.6

## 2014-11-11 NOTE — Patient Instructions (Signed)
Thank you for coming in,   Please try to bring me your blood sugar meter. Let me know what your morning sugar is and what it is after you eat.   Please follow up with me in 3 months.    Please feel free to call with any questions or concerns at any time, at [phone]. --Dr. Raeford Razor

## 2014-11-11 NOTE — Progress Notes (Signed)
Subjective:    Patient ID: [name], male    DOB: 07/06/1941, 73 y.o.   MRN: [id number]  HPI  [name] is here for f/u.  CHRONIC DIABETES  Disease Monitoring  Blood Sugar Ranges: 125-140  Polyuria: no   Visual problems: no   Medication Compliance: yes  Medication Side Effects  Hypoglycemia: no   Preventitive Health Care  Eye Exam: has been over a year.   Foot Exam: followed by Dr. Sharol Given for chronic osteo  Diet pattern: receives meals from meals on wheels  Exercise: moves his arms for exercise   HTN Disease Monitoring: Home BP Monitoring 160/60 Chest pain- no    Dyspnea- no Medications:Maxide, metoprolol  Compliance-  yes. Lightheadedness-  no  Edema- no    Current Outpatient Prescriptions on File Prior to Visit  Medication Sig Dispense Refill  . albuterol (PROVENTIL HFA;VENTOLIN HFA) 108 (90 BASE) MCG/ACT inhaler Inhale 2 puffs into the lungs every 4 (four) hours as needed for wheezing or shortness of breath.    Marland Kitchen aspirin 81 MG chewable tablet Chew 81 mg by mouth daily.     . Blood Glucose Monitoring Suppl (ONE TOUCH ULTRA SYSTEM KIT) W/DEVICE KIT 1 kit by Does not apply route once. 1 each 0  . cyclobenzaprine (FLEXERIL) 10 MG tablet Take 1 tablet (10 mg total) by mouth 3 (three) times daily as needed (muscle pain). 30 tablet 0  . doxycycline (VIBRA-TABS) 100 MG tablet Take 100 mg by mouth 2 (two) times daily.    Marland Kitchen EASY TOUCH PEN NEEDLES 31G X 8 MM MISC USE WITH FLEXPEN AS DIRECTED 100 each 10  . esomeprazole (NEXIUM) 40 MG capsule Take 40 mg by mouth 2 (two) times daily before a meal.    . fluticasone (CUTIVATE) 0.05 % cream Apply 1 application topically 2 (two) times daily.    . fluticasone (FLOVENT HFA) 220 MCG/ACT inhaler Inhale 1 puff into the lungs 2 (two) times daily. 1 Inhaler 4  . furosemide (LASIX) 40 MG tablet TAKE 1 TABLET BY MOUTH TWICE DAILY 60 tablet 2  . glucose blood test strip Use as instructed 100 each 12  . insulin aspart (NOVOLOG  FLEXPEN) 100 UNIT/ML FlexPen INJECT 20 UNITS SUBCUTANEOUSLY WITH LUNCH AND 20 UNITS WITH SUPPER 15 pen 2  . insulin glargine (LANTUS) 100 UNIT/ML injection Inject 75 Units into the skin 2 (two) times daily.    Marland Kitchen latanoprost (XALATAN) 0.005 % ophthalmic solution Place 1 drop into both eyes at bedtime. 2.5 mL 3  . LEVEMIR FLEXTOUCH 100 UNIT/ML Pen INJECT 75 UNITS SUBCUTANEOUSLY TWICE DAILY 45 pen 11  . metoprolol (LOPRESSOR) 100 MG tablet Take 1 tablet (100 mg total) by mouth 2 (two) times daily. 60 tablet 2  . mometasone (NASONEX) 50 MCG/ACT nasal spray Place 2 sprays into the nose daily. 17 g 12  . mupirocin ointment (BACTROBAN) 2 % Apply 1 application topically 2 (two) times daily.    Marland Kitchen NEXIUM 40 MG capsule TAKE 1 CAPSULE BY MOUTH TWICE DAILY FOR REFLUX 60 capsule 2  . ONE TOUCH LANCETS MISC Check blood sugar twice a day. 200 each 12  . pravastatin (PRAVACHOL) 40 MG tablet Take 1 tablet (40 mg total) by mouth daily. 90 tablet 3  . PROAIR HFA 108 (90 BASE) MCG/ACT inhaler INHALE 2 PUFFS BY MOUTH EVERY 4 HOURS AS NEEDED FOR WHEEZING OR SHORTNESS OF BREATH 1 Inhaler 3  . sennosides-docusate sodium (SENOKOT-S) 8.6-50 MG tablet Take 1 tablet by mouth daily  as needed for constipation. 60 tablet 2  . silver sulfADIAZINE (SILVADENE) 1 % cream APPLY AS DIRECTED TO WOUND EVERY DAY 50 g 3  . tamsulosin (FLOMAX) 0.4 MG CAPS capsule Take 0.8 mg by mouth daily after breakfast.    . tamsulosin (FLOMAX) 0.4 MG CAPS capsule TAKE 2 CAPSULES BY MOUTH EVERY MORNING 60 capsule 2  . traMADol (ULTRAM) 50 MG tablet TAKE 1 TABLET BY MOUTH TWICE DAILY AS NEEDED FOR PAIN 60 tablet 1  . triamterene-hydrochlorothiazide (MAXZIDE-25) 37.5-25 MG per tablet Take 1 tablet by mouth daily.    Marland Kitchen triamterene-hydrochlorothiazide (MAXZIDE-25) 37.5-25 MG per tablet TAKE 1 TABLET BY MOUTH EVERY DAY FOR BLOOD PRESSURE 30 tablet 1   No current facility-administered medications on file prior to visit.   Health Maintenance: has received  flu shot    Review of Systems See HPI     Objective:   Physical Exam BP 170/75 mmHg  Pulse 68  Temp(Src) 98.2 F (36.8 C) (Oral)  Ht 6' (1.829 m)  Wt 430 lb (195.047 kg)  BMI 58.31 kg/m2 Gen: NAD, alert, cooperative with exam, morbidly obese, using electric wheelchair CV: RRR, good S1/S2, no murmur,   Resp: CTABL, no wheezes, non-labored Abd: protuberant    Assessment & Plan:

## 2014-11-13 ENCOUNTER — Encounter: Payer: Self-pay | Admitting: Family Medicine

## 2014-11-13 NOTE — Assessment & Plan Note (Signed)
Uncontrolled. Elevated today. Reports his readings at home in 160's. Asymptomatic  - no changes in medications  - ACEi and ARB intolerant  - may need to add Bidil in future or CCB.  - consider ECHO in future with his last being in 2009.

## 2014-11-13 NOTE — Assessment & Plan Note (Signed)
Increase in Hgb A1c. Doesn't have meter and reports morning readings are between 120 and 140.  - no change currently - if he brings his meter, consider increasing his novolog dose

## 2014-11-14 ENCOUNTER — Other Ambulatory Visit: Payer: Self-pay | Admitting: *Deleted

## 2014-11-14 DIAGNOSIS — E785 Hyperlipidemia, unspecified: Secondary | ICD-10-CM

## 2014-11-16 MED ORDER — PRAVASTATIN SODIUM 40 MG PO TABS: 40.0000 mg | ORAL_TABLET | Freq: Every day | ORAL | Status: DC

## 2014-11-18 ENCOUNTER — Other Ambulatory Visit: Payer: Self-pay | Admitting: *Deleted

## 2014-11-18 DIAGNOSIS — J3089 Other allergic rhinitis: Secondary | ICD-10-CM

## 2014-11-20 MED ORDER — MOMETASONE FUROATE 50 MCG/ACT NA SUSP: 2.0000 | Freq: Every day | NASAL | Status: DC

## 2014-12-06 ENCOUNTER — Telehealth: Payer: Self-pay | Admitting: Family Medicine

## 2014-12-06 NOTE — Telephone Encounter (Signed)
Pt called and needs orders faxed to [facility] for his wheelchair. Please fax to [phone]. jw

## 2014-12-07 NOTE — Telephone Encounter (Signed)
Called [facility] and rep was unsure of the orders that needed to be faxed. He hasn't contacted [facility] since March 2015. They would need a written prescription prior to having orders faxed.   Rosemarie Ax, MD PGY-2, [place] [facility] 12/07/2014, 12:03 PM

## 2014-12-09 ENCOUNTER — Other Ambulatory Visit: Payer: Self-pay | Admitting: Family Medicine

## 2014-12-09 DIAGNOSIS — I10 Essential (primary) hypertension: Secondary | ICD-10-CM

## 2014-12-16 ENCOUNTER — Other Ambulatory Visit: Payer: Self-pay | Admitting: Family Medicine

## 2014-12-19 ENCOUNTER — Telehealth: Payer: Self-pay | Admitting: Family Medicine

## 2014-12-19 NOTE — Telephone Encounter (Signed)
Pt called and needs the doctor to call [facility] at [phone] ext [phone] so that they can tell the doctor how they need the prescription written. jw

## 2014-12-19 NOTE — Telephone Encounter (Signed)
Pt returns call asking about Power wheelchair.  Advised that Dr. Raeford Razor gave them a call on 12/07/14 and they had no record of a power wheelchair.  Pt states that he just spoke with them a few weeks ago.  Advise I would send message back to MD assistants. Fleeger, Salome Spotted

## 2014-12-26 NOTE — Telephone Encounter (Signed)
Spoke with [facility] with the number provided [phone] ext [phone]. They currently don't have a prescription for a power wheel chair and don't have a case open.   Will send the last two notes from his office visits from me and Dr. Andria Frames. These will be faxed to [phone] Attn: rehab.   If these office notes do not suffice, then the patient will need to schedule another office visit where a face to face can be completed. A representative from the [facility] may accompany the patient for the evaluation.   Rosemarie Ax, MD PGY-2, [place] [facility] 12/26/2014, 4:51 PM

## 2014-12-27 NOTE — Telephone Encounter (Signed)
Printed and faxed out

## 2015-01-12 ENCOUNTER — Other Ambulatory Visit: Payer: Self-pay | Admitting: Family Medicine

## 2015-01-12 DIAGNOSIS — E118 Type 2 diabetes mellitus with unspecified complications: Secondary | ICD-10-CM

## 2015-01-12 DIAGNOSIS — I10 Essential (primary) hypertension: Secondary | ICD-10-CM

## 2015-01-20 ENCOUNTER — Ambulatory Visit: Payer: Self-pay | Admitting: Family Medicine

## 2015-02-01 ENCOUNTER — Encounter: Payer: Self-pay | Admitting: Family Medicine

## 2015-02-01 ENCOUNTER — Ambulatory Visit (INDEPENDENT_AMBULATORY_CARE_PROVIDER_SITE_OTHER): Payer: PPO | Admitting: Family Medicine

## 2015-02-01 VITALS — BP 181/55 | HR 64 | Temp 98.1°F | Ht 72.0 in | Wt >= 6400 oz

## 2015-02-01 DIAGNOSIS — Z7409 Other reduced mobility: Secondary | ICD-10-CM

## 2015-02-01 NOTE — Assessment & Plan Note (Signed)
I believe that he meets the criteria for a power wheel chair.  - script for power wheel chair   - - seat that is able to elevate   - - leg lifts that are able to elevate   - - wider seat

## 2015-02-01 NOTE — Progress Notes (Signed)
Subjective:    Patient ID: [name], male    DOB: August 12, 1941, 73 y.o.   MRN: [id number]  HPI  [name] is here for evaluation of power wheel chair. PMH for chronic left leg venous osteomyelitis on chronic antibiotics and chronic venous insufficiency treated by Dr. Sharol Given, morbid obesity, diabetes type 2 and diabetic neuropathy.   Has had his current power wheel chair for roughly 10 years. He is requesting that the new chair has the ability to raise the seat so he may get into his cabinets easier, a leg lift, and a wider seat. Due to his weight and orthopedic problems, he can no longer get on and off his scooter. He is using a power wheel chair given to him by a friend.   He can only do transfers and has to uses crutches. He cannot walk more than 5 feet when doing this. He uses power wheelchair for all ADLs such as going to the bathroom, to the commode, to bathe and to the kitchen to prepare his own meals (he lives alone).   Cannot use a manual wheelchair because he has a fussed right wrist and doesn't have the arm strength given his weight.   Not able to use a scooter because he cannot straddle scooter and it doesn't allow for elevation of his left leg.   He has the dexterity and normal intellect to safely operate a power wheelchair. He lives in a handicap apartment and has room to operate. He is willing and motivated to properly use the power wheel chair.    Current Outpatient Prescriptions on File Prior to Visit  Medication Sig Dispense Refill  . albuterol (PROVENTIL HFA;VENTOLIN HFA) 108 (90 BASE) MCG/ACT inhaler Inhale 2 puffs into the lungs every 4 (four) hours as needed for wheezing or shortness of breath.    Marland Kitchen aspirin 81 MG chewable tablet Chew 81 mg by mouth daily.     . Blood Glucose Monitoring Suppl (ONE TOUCH ULTRA SYSTEM KIT) W/DEVICE KIT 1 kit by Does not apply route once. 1 each 0  . cyclobenzaprine (FLEXERIL) 10 MG tablet Take 1 tablet (10 mg total) by mouth 3  (three) times daily as needed (muscle pain). 30 tablet 0  . doxycycline (VIBRA-TABS) 100 MG tablet Take 100 mg by mouth 2 (two) times daily.    Marland Kitchen EASY TOUCH PEN NEEDLES 31G X 8 MM MISC USE WITH FLEXPEN AS DIRECTED 100 each 10  . esomeprazole (NEXIUM) 40 MG capsule Take 40 mg by mouth 2 (two) times daily before a meal.    . FLOVENT HFA 220 MCG/ACT inhaler INHALE 1 PUFF BY MOUTH, INTO THE LUNGS,TWICE DAILY 12 g 3  . fluticasone (CUTIVATE) 0.05 % cream Apply 1 application topically 2 (two) times daily.    . furosemide (LASIX) 40 MG tablet TAKE 1 TABLET BY MOUTH TWICE DAILY 60 tablet 1  . glucose blood test strip Use as instructed 100 each 12  . insulin glargine (LANTUS) 100 UNIT/ML injection Inject 75 Units into the skin 2 (two) times daily.    Marland Kitchen latanoprost (XALATAN) 0.005 % ophthalmic solution INSTILL 1 DROP IN BOTH EYES EVERY EVENING FOR GLAUCOMA 2.5 mL 2  . LEVEMIR FLEXTOUCH 100 UNIT/ML Pen INJECT 75 UNITS SUBCUTANEOUSLY TWICE DAILY 45 pen 11  . metoprolol (LOPRESSOR) 100 MG tablet TAKE 1 TABLET BY MOUTH TWICE DAILY 60 tablet 1  . mometasone (NASONEX) 50 MCG/ACT nasal spray Place 2 sprays into the nose daily. 17 g 12  .  mupirocin ointment (BACTROBAN) 2 % Apply 1 application topically 2 (two) times daily.    Marland Kitchen NEXIUM 40 MG capsule TAKE 1 CAPSULE BY MOUTH TWICE DAILY FOR REFLUX 60 capsule 1  . NOVOLOG FLEXPEN 100 UNIT/ML FlexPen INJECT 20 UNITS SUBCUTANEOUSLY WITH LUNCH AND 20 UNITS WITH SUPPER 15 mL 5  . ONE TOUCH LANCETS MISC Check blood sugar twice a day. 200 each 12  . pravastatin (PRAVACHOL) 40 MG tablet Take 1 tablet (40 mg total) by mouth daily. 90 tablet 3  . PROAIR HFA 108 (90 BASE) MCG/ACT inhaler INHALE 2 PUFFS BY MOUTH EVERY 4 HOURS AS NEEDED FOR WHEEZING OR SHORTNESS OF BREATH 8.5 g 2  . sennosides-docusate sodium (SENOKOT-S) 8.6-50 MG tablet Take 1 tablet by mouth daily as needed for constipation. 60 tablet 2  . silver sulfADIAZINE (SILVADENE) 1 % cream APPLY AS DIRECTED TO WOUND  EVERY DAY 50 g 3  . tamsulosin (FLOMAX) 0.4 MG CAPS capsule Take 0.8 mg by mouth daily after breakfast.    . tamsulosin (FLOMAX) 0.4 MG CAPS capsule TAKE 2 CAPSULES BY MOUTH EVERY MORNING 60 capsule 1  . traMADol (ULTRAM) 50 MG tablet TAKE 1 TABLET BY MOUTH TWICE DAILY AS NEEDED FOR PAIN 60 tablet 1  . triamterene-hydrochlorothiazide (MAXZIDE-25) 37.5-25 MG per tablet TAKE 1 TABLET BY MOUTH EVERY DAY FOR BLOOD PRESSURE 30 tablet 3   No current facility-administered medications on file prior to visit.    Review of Systems See HPI     Objective:   Physical Exam BP 181/55 mmHg  Pulse 64  Temp(Src) 98.1 F (36.7 C)  Ht 6' (1.829 m)  Wt 420 lb (190.511 kg)  BMI 56.95 kg/m2 Gen: NAD, alert, cooperative with exam, morbidly obese, using power wheel chair  Abd: protuberant  Ext: 4/5 strength in left and right UE, +2 pulses b/l UE ,  Right wrist fused with no ROM  Left leg with limited ROM Unable to stand safely Bother lower extremities heavily wrapped in wound care dressings and compression wraps  Neuro: alert and oriented      Assessment & Plan:

## 2015-02-01 NOTE — Patient Instructions (Signed)
Thank you for coming in,   Please let know if you have any complications and we'll try to iron out the details.    Please feel free to call with any questions or concerns at any time, at [phone]. --Dr. Raeford Razor

## 2015-02-02 NOTE — Progress Notes (Signed)
I agree with the resident documentation and plan.   Narayan Scull MD  

## 2015-02-09 ENCOUNTER — Other Ambulatory Visit: Payer: Self-pay | Admitting: Family Medicine

## 2015-02-09 DIAGNOSIS — C61 Malignant neoplasm of prostate: Secondary | ICD-10-CM

## 2015-02-09 DIAGNOSIS — K219 Gastro-esophageal reflux disease without esophagitis: Secondary | ICD-10-CM

## 2015-03-06 ENCOUNTER — Telehealth: Payer: Self-pay | Admitting: Home Health Services

## 2015-03-06 NOTE — Telephone Encounter (Signed)
Call patient to schedule AWV.  Pt not interested at this time, will call back when ready.

## 2015-03-07 ENCOUNTER — Telehealth: Payer: Self-pay | Admitting: *Deleted

## 2015-03-07 DIAGNOSIS — K219 Gastro-esophageal reflux disease without esophagitis: Secondary | ICD-10-CM

## 2015-03-07 NOTE — Telephone Encounter (Signed)
Received a fax from [place] stating that pt's insurance will not cover the twice daily esomeprazole.  They will cover only 1 capsule by mouth daily.  Please send in new Rx for esomeprazole once daily.  Derl Barrow, RN

## 2015-03-08 ENCOUNTER — Other Ambulatory Visit: Payer: Self-pay | Admitting: Family Medicine

## 2015-03-08 MED ORDER — ESOMEPRAZOLE MAGNESIUM 40 MG PO CPDR: 40.0000 mg | DELAYED_RELEASE_CAPSULE | Freq: Every day | ORAL | Status: DC

## 2015-03-08 NOTE — Telephone Encounter (Signed)
Esomeprazole changed from BID to QD based on insurance.   Rosemarie Ax, MD PGY-2, [facility] 03/08/2015, 8:34 AM

## 2015-03-09 ENCOUNTER — Other Ambulatory Visit: Payer: Self-pay | Admitting: Family Medicine

## 2015-03-09 ENCOUNTER — Encounter: Payer: Self-pay | Admitting: *Deleted

## 2015-03-09 DIAGNOSIS — J449 Chronic obstructive pulmonary disease, unspecified: Secondary | ICD-10-CM

## 2015-03-09 DIAGNOSIS — H409 Unspecified glaucoma: Secondary | ICD-10-CM

## 2015-03-09 DIAGNOSIS — I10 Essential (primary) hypertension: Secondary | ICD-10-CM

## 2015-03-09 DIAGNOSIS — N400 Enlarged prostate without lower urinary tract symptoms: Secondary | ICD-10-CM

## 2015-03-09 NOTE — Progress Notes (Signed)
Prior Authorization received from [employer] for esomeprazole. Formulary and PA form placed in provider box for completion. Derl Barrow, RN

## 2015-03-14 ENCOUNTER — Telehealth: Payer: Self-pay | Admitting: Family Medicine

## 2015-03-14 NOTE — Telephone Encounter (Signed)
Form placed in Tamika's box.   Rosemarie Ax, MD PGY-2, [facility] 03/14/2015, 3:22 PM

## 2015-03-15 NOTE — Progress Notes (Signed)
PA for esomeprazole faxed to [facility] for review.  Derl Barrow, RN

## 2015-03-15 NOTE — Progress Notes (Signed)
PA for esomeprazole 40 mg was approved from [facility] until 12/30/2015.  [place] [facility] aware of approval.  Derl Barrow, RN

## 2015-04-06 ENCOUNTER — Other Ambulatory Visit: Payer: Self-pay | Admitting: Family Medicine

## 2015-04-28 ENCOUNTER — Other Ambulatory Visit: Payer: Self-pay | Admitting: Family Medicine

## 2015-04-28 DIAGNOSIS — C61 Malignant neoplasm of prostate: Secondary | ICD-10-CM

## 2015-04-28 DIAGNOSIS — I10 Essential (primary) hypertension: Secondary | ICD-10-CM

## 2015-04-28 DIAGNOSIS — K219 Gastro-esophageal reflux disease without esophagitis: Secondary | ICD-10-CM

## 2015-04-28 DIAGNOSIS — M7989 Other specified soft tissue disorders: Secondary | ICD-10-CM

## 2015-04-28 DIAGNOSIS — H409 Unspecified glaucoma: Secondary | ICD-10-CM

## 2015-04-28 DIAGNOSIS — J449 Chronic obstructive pulmonary disease, unspecified: Secondary | ICD-10-CM

## 2015-06-01 ENCOUNTER — Other Ambulatory Visit: Payer: Self-pay | Admitting: Family Medicine

## 2015-06-01 DIAGNOSIS — I10 Essential (primary) hypertension: Secondary | ICD-10-CM

## 2015-06-01 DIAGNOSIS — E118 Type 2 diabetes mellitus with unspecified complications: Secondary | ICD-10-CM

## 2015-06-01 DIAGNOSIS — J45909 Unspecified asthma, uncomplicated: Secondary | ICD-10-CM

## 2015-06-01 DIAGNOSIS — M8660 Other chronic osteomyelitis, unspecified site: Secondary | ICD-10-CM

## 2015-06-09 ENCOUNTER — Telehealth: Payer: Self-pay | Admitting: Family Medicine

## 2015-06-09 NOTE — Telephone Encounter (Signed)
[name] is calling to inform his PCP to expect forms for diabetic shoes from [facility] and for a wheelchair from [facility]. Thank you, Fonda Kinder, ASA

## 2015-06-09 NOTE — Telephone Encounter (Signed)
Received paperwork regarding patient needing diabetic shoes and knee braces.  Requires that he need to be seen by me in the last 6 months. He will need to schedule an appt with me in the future.   Rosemarie Ax, MD PGY-2, [facility] 06/09/2015, 1:44 PM

## 2015-06-13 ENCOUNTER — Telehealth: Payer: Self-pay | Admitting: Family Medicine

## 2015-06-13 NOTE — Telephone Encounter (Signed)
[facility] provider dropped off form to be filled out for power wheelchair.  Please call him when completed.  [phone]

## 2015-06-14 NOTE — Telephone Encounter (Signed)
Form placed in PCP box. Zimmerman Rumple, April D, CMA  

## 2015-06-19 NOTE — Telephone Encounter (Signed)
Spoke with Mr. Gasaway and informed that the paperwork he gave me required him to have seen me in the past 6 months.  He will try to make an appt. Paperwork cannot be filled out until that is done.   Rosemarie Ax, MD PGY-2, [facility] 06/19/2015, 7:07 AM

## 2015-06-22 ENCOUNTER — Other Ambulatory Visit: Payer: Self-pay | Admitting: Family Medicine

## 2015-06-22 DIAGNOSIS — K219 Gastro-esophageal reflux disease without esophagitis: Secondary | ICD-10-CM

## 2015-06-22 DIAGNOSIS — M7989 Other specified soft tissue disorders: Secondary | ICD-10-CM

## 2015-06-22 DIAGNOSIS — E118 Type 2 diabetes mellitus with unspecified complications: Secondary | ICD-10-CM

## 2015-06-22 DIAGNOSIS — J449 Chronic obstructive pulmonary disease, unspecified: Secondary | ICD-10-CM

## 2015-06-28 ENCOUNTER — Other Ambulatory Visit: Payer: Self-pay | Admitting: Family Medicine

## 2015-06-30 ENCOUNTER — Ambulatory Visit (INDEPENDENT_AMBULATORY_CARE_PROVIDER_SITE_OTHER): Payer: PPO | Admitting: Family Medicine

## 2015-06-30 ENCOUNTER — Other Ambulatory Visit: Payer: Self-pay | Admitting: Family Medicine

## 2015-06-30 ENCOUNTER — Encounter: Payer: Self-pay | Admitting: Family Medicine

## 2015-06-30 VITALS — BP 135/70 | HR 73 | Temp 98.6°F | Ht 72.0 in | Wt >= 6400 oz

## 2015-06-30 DIAGNOSIS — Z7409 Other reduced mobility: Secondary | ICD-10-CM | POA: Diagnosis not present

## 2015-06-30 DIAGNOSIS — I798 Other disorders of arteries, arterioles and capillaries in diseases classified elsewhere: Secondary | ICD-10-CM

## 2015-06-30 DIAGNOSIS — I10 Essential (primary) hypertension: Secondary | ICD-10-CM

## 2015-06-30 DIAGNOSIS — E1151 Type 2 diabetes mellitus with diabetic peripheral angiopathy without gangrene: Secondary | ICD-10-CM | POA: Diagnosis not present

## 2015-06-30 LAB — POCT GLYCOSYLATED HEMOGLOBIN (HGB A1C): Hemoglobin A1C: 9.2

## 2015-06-30 NOTE — Assessment & Plan Note (Addendum)
I fully support his application for a power wheel chair as I believe he meets the criteria.   Face to face completed and paperwork will be completed and scanned into chart

## 2015-06-30 NOTE — Assessment & Plan Note (Addendum)
Uncontrolled. Continues to be elevated.  - currently 75 U twice daily lantus  - novolog 20 U two times with meals.  - will need to call and increase lantus 2U per day until morning BG <130  - will add novolog 20U to three times daily.  - paperwork requesting diabetic shoes and knee brace. He reports wearing diabetic shoes for when he goes to church. Will complete paperwork for diabetic shoes but not knee brace.

## 2015-06-30 NOTE — Assessment & Plan Note (Signed)
Controlled upon re-check. No changes to medications.

## 2015-06-30 NOTE — Progress Notes (Signed)
Subjective:    Patient ID: [name], male    DOB: [birth year], 74 y.o.   MRN: [id number]  HPI  [name] is here for exam for power wheel chair.   Medical diagnosis in include morbid obesity, chronic osteomyelitis, left leg with ulcer, DM2, diabetic neuropathy.   Initially had a power scooter ~10 years and a power wheel chair for ~3 years. He had to switch to a power wheel chair due to his weight and orthopeadic problems and can no longer get on and off scooter.  He is currently using a scooter since his his power wheel chair broke three weeks ago.    He is able to stand with crutches and walk a maximum of 5 feet. Uses power wheel chair for all ADLs such as getting to the bathroom to use the commode or bath or get tot the kitchen to prepare his own meals (He lives alone).   Cannot use manual wheel chair because he has a fused right wrist. Does not have the arm strength for his body weight. He is unable to flex his left lower leg.  Patient reports that his orthopedic doctor thought he needs bilateral knee replacements but is unable to perform due to his chronic osteomyelitis.    Cannot use a scooter since he has trouble transferring on and off and cannot maneuver around his apt. The scooter also doesn't allow elevation of his left leg.   Patient has the dexterity and normal intellect to safely operate power wheelchair. He lives is a handicapped apartment that has space for a power wheel chair.  Patient is willing and highly motivated to properly use the power wheel chair.    HTN Disease Monitoring: Home BP Monitoring doesn't measure (reports running around in ~130's/70's) Chest pain- no    Dyspnea- no Medications:lopressor and maxide  Compliance-  yes Lightheadedness-  no  Edema- chronic from his other conditions.   CHRONIC DIABETES  Disease Monitoring  Blood Sugar Ranges: 120-140  Polyuria: no   Visual problems: no   Medication Compliance: yes  Medication Side  Effects  Hypoglycemia: no   Preventitive Health Care  Eye Exam: 1 year ago. Dr. Doreen Beam Exam: goes to Dr. Sharol Given. appt last week.   Diet pattern: Supper: chicken nuggets Lunch: sausage and egg biscut Breakfast: none   Exercise: unable.      Current Outpatient Prescriptions on File Prior to Visit  Medication Sig Dispense Refill  . albuterol (PROVENTIL HFA;VENTOLIN HFA) 108 (90 BASE) MCG/ACT inhaler Inhale 2 puffs into the lungs every 4 (four) hours as needed for wheezing or shortness of breath.    Marland Kitchen aspirin 81 MG chewable tablet Chew 81 mg by mouth daily.     . Blood Glucose Monitoring Suppl (ONE TOUCH ULTRA SYSTEM KIT) W/DEVICE KIT 1 kit by Does not apply route once. 1 each 0  . cyclobenzaprine (FLEXERIL) 10 MG tablet Take 1 tablet (10 mg total) by mouth 3 (three) times daily as needed (muscle pain). 30 tablet 0  . doxycycline (VIBRA-TABS) 100 MG tablet Take 100 mg by mouth 2 (two) times daily.    Marland Kitchen EASY TOUCH PEN NEEDLES 31G X 8 MM MISC USE WITH FLEXPEN AS DIRECTED 100 each 9  . esomeprazole (NEXIUM) 40 MG capsule TAKE 1 CAPSULE BY MOUTH EVERY DAY 60 capsule 1  . FLOVENT HFA 220 MCG/ACT inhaler INHALE 1 PUFF BY MOUTH, INTO THE LUNGS,TWICE DAILY 12 g 1  . fluticasone (CUTIVATE) 0.05 % cream  Apply 1 application topically 2 (two) times daily.    . furosemide (LASIX) 40 MG tablet TAKE 1 TABLET BY MOUTH TWICE DAILY 60 tablet 1  . glucose blood test strip Use as instructed 100 each 12  . insulin glargine (LANTUS) 100 UNIT/ML injection Inject 75 Units into the skin 2 (two) times daily.    Marland Kitchen latanoprost (XALATAN) 0.005 % ophthalmic solution INSTILL 1 DROP IN BOTH EYES EVERY EVENING FOR GLAUCOMA 2.5 mL 3  . LEVEMIR FLEXTOUCH 100 UNIT/ML Pen INJECT 75 UNITS SUBCUTANEOUSLY TWICE DAILY 45 pen 11  . metoprolol (LOPRESSOR) 100 MG tablet TAKE 1 TABLET BY MOUTH TWICE DAILY 60 tablet 1  . mometasone (NASONEX) 50 MCG/ACT nasal spray Place 2 sprays into the nose daily. 17 g 12  . mupirocin ointment  (BACTROBAN) 2 % Apply 1 application topically 2 (two) times daily.    Marland Kitchen NOVOLOG FLEXPEN 100 UNIT/ML FlexPen INJECT 20 UNITS SUBCUTANEOUSLY WITH LUNCH AND 20 UNITS WITH SUPPER 15 pen 4  . ONE TOUCH LANCETS MISC Check blood sugar twice a day. 200 each 12  . pravastatin (PRAVACHOL) 40 MG tablet Take 1 tablet (40 mg total) by mouth daily. 90 tablet 3  . PROAIR HFA 108 (90 BASE) MCG/ACT inhaler INHALE 2 PUFFS BY MOUTH EVERY 4 HOURS AS NEEDED FOR WHEEZING OR SHORTNESS OF BREATH 8.5 each 1  . sennosides-docusate sodium (SENOKOT-S) 8.6-50 MG tablet Take 1 tablet by mouth daily as needed for constipation. 60 tablet 2  . silver sulfADIAZINE (SILVADENE) 1 % cream APPLY AS DIRECTED TO WOUND EVERY DAY 50 g 2  . tamsulosin (FLOMAX) 0.4 MG CAPS capsule Take 0.8 mg by mouth daily after breakfast.    . tamsulosin (FLOMAX) 0.4 MG CAPS capsule TAKE 2 CAPSULES BY MOUTH EVERY MORNING 60 capsule 1  . traMADol (ULTRAM) 50 MG tablet TAKE 1 TABLET BY MOUTH TWICE DAILY AS NEEDED FOR PAIN 60 tablet 1  . triamterene-hydrochlorothiazide (MAXZIDE-25) 37.5-25 MG per tablet TAKE 1 TABLET BY MOUTH EVERY DAY FOR BLOOD PRESSURE 30 tablet 3   No current facility-administered medications on file prior to visit.    SHx: no tobacco or EtOH   Health Maintenance: Hgb A1c   Review of Systems See HPI     Objective:   Physical Exam BP 167/65 mmHg  Pulse 73  Temp(Src) 98.6 F (37 C) (Oral)  Ht 6' (1.829 m)  Wt 450 lb (204.119 kg)  BMI 61.02 kg/m2 Gen: NAD, alert, cooperative with exam, well-appearing CV: RRR, good Y5/K3, systolic murmur, +3  Edema b/l LE, capillary refill brisk  Resp: CTABL, no wheezes, non-labored Neuro: normal cognition  MSK: 4/5 strength in UE b/l. Left leg unable to flex with limited ROM. Right knee can flex to 90 degrees Shoulder: no pain to palpation  Left abduction to 30 degrees. Flexion to 75 degrees Right abduction to 90 degress. Flexion to 90 degrees  Wrist Right: no range of motion due to  fusion  Left: full ROM Patient's weight and height are reported as he was unable to stand for height or stand to get onto the scale     Assessment & Plan:

## 2015-06-30 NOTE — Patient Instructions (Signed)
Thank you for coming in,   I will get the information together and get it back to you.   Please bring all of your medications with you to each visit.    Please feel free to call with any questions or concerns at any time, at [phone]. --Dr. Raeford Razor  Diet Recommendations for Diabetes   Starchy (carb) foods include: Bread, rice, pasta, potatoes, corn, crackers, bagels, muffins, all baked goods.   Protein foods include: Meat, fish, poultry, eggs, dairy foods, and beans such as pinto and kidney beans (beans also provide carbohydrate).   1. Eat at least 3 meals and 1-2 snacks per day. Never go more than 4-5 hours while awake without eating.  2. Limit starchy foods to TWO per meal and ONE per snack. ONE portion of a starchy  food is equal to the following:   - ONE slice of bread (or its equivalent, such as half of a hamburger bun).   - 1/2 cup of a "scoopable" starchy food such as potatoes or rice.   - 1 OUNCE (28 grams) of starchy snack foods such as crackers or pretzels (look on label).   - 15 grams of carbohydrate as shown on food label.  3. Both lunch and dinner should include a protein food, a carb food, and vegetables.   - Obtain twice as many veg's as protein or carbohydrate foods for both lunch and dinner.   - Try to keep frozen veg's on hand for a quick vegetable serving.     - Fresh or frozen veg's are best.  4. Breakfast should always include protein.

## 2015-07-02 ENCOUNTER — Telehealth: Payer: Self-pay | Admitting: Family Medicine

## 2015-07-02 NOTE — Telephone Encounter (Signed)
Left VM for patient. If he calls back please have him speak with a nurse/CMA and inform him that we need to add a third dose of novolog to his regimen and increase his insulin until his morning blood sugars are below 130.   If any questions then please take the best time and phone number to call and I will try to call him back.   Rosemarie Ax, MD PGY-2, [facility] 07/02/2015, 3:11 PM

## 2015-07-13 ENCOUNTER — Telehealth: Payer: Self-pay | Admitting: Family Medicine

## 2015-07-13 NOTE — Telephone Encounter (Signed)
Pt called and wanted to know if Dr. Raeford Razor signed his forms to get knee braces. Please let patient know. jw

## 2015-07-14 NOTE — Telephone Encounter (Signed)
Spoke with patient. I don't believe that he would benefit from any form of knee brace.   Rosemarie Ax, MD PGY-3, [facility] 07/14/2015, 4:05 PM

## 2015-07-24 ENCOUNTER — Telehealth: Payer: Self-pay | Admitting: Family Medicine

## 2015-07-24 NOTE — Telephone Encounter (Signed)
Pt brought in paperwork last week about getting a power whell chair He has not heard anythings yet Please advise

## 2015-07-26 ENCOUNTER — Other Ambulatory Visit: Payer: Self-pay | Admitting: Family Medicine

## 2015-07-27 NOTE — Telephone Encounter (Signed)
Spoke with patient about his power wheel chair. Information has already been completed and faxed. He needs to call the medical supply company if he hasn't heard any further advancements.   Rosemarie Ax, MD PGY-3, [facility] 07/27/2015, 1:48 PM

## 2015-08-03 NOTE — Telephone Encounter (Signed)
Rep from [employer] checking on status of forms. I informed of message below. However forms were also for diabetic shoes, would like to know if those would be necessary. Thank you, Fonda Kinder, ASA

## 2015-08-08 ENCOUNTER — Telehealth: Payer: Self-pay | Admitting: Family Medicine

## 2015-08-08 NOTE — Telephone Encounter (Signed)
Pt called because the wheelchair company said that they did not received our fax on the 28th of July. They need [place] to re-fax this ASAP because his chair is broken and he can not get around. jw

## 2015-08-08 NOTE — Telephone Encounter (Signed)
Do you recall this form and if so, when it was faxed?  I am trying to locate the form to refax. Thanks

## 2015-08-10 NOTE — Telephone Encounter (Signed)
Contacted pt to request him to contact the wheelchair company and for them to fax over the paperwork mentioned below. Told him it would probably be quicker that way since i did not have the exact date that they were faxed. Pt understood. Katharina Caper, Francesco Provencal D, [place]

## 2015-08-23 ENCOUNTER — Other Ambulatory Visit: Payer: Self-pay | Admitting: Family Medicine

## 2015-09-01 ENCOUNTER — Telehealth: Payer: Self-pay | Admitting: Family Medicine

## 2015-09-01 NOTE — Telephone Encounter (Signed)
Pt wanted to know when and if the doctor was going to fax the prescription for her diabetic shoes. If you need more info call [phone] or fax the papers to [phone]. jw

## 2015-09-07 NOTE — Telephone Encounter (Signed)
Spoke to [place], will fax form for diabetic shoes. Ottis Stain, CMA

## 2015-09-07 NOTE — Telephone Encounter (Signed)
[name] returned call  Ok to leave another message

## 2015-09-07 NOTE — Telephone Encounter (Signed)
Called [phone] and left a message asking them to call us regarding faxing a form for diabetic shoes for one of our patients. Ottis Stain, CMA

## 2015-09-21 ENCOUNTER — Other Ambulatory Visit: Payer: Self-pay | Admitting: Family Medicine

## 2015-10-18 ENCOUNTER — Other Ambulatory Visit: Payer: Self-pay | Admitting: Family Medicine

## 2015-11-15 ENCOUNTER — Other Ambulatory Visit: Payer: Self-pay | Admitting: Family Medicine

## 2015-12-07 ENCOUNTER — Other Ambulatory Visit: Payer: Self-pay | Admitting: Family Medicine

## 2016-01-03 DIAGNOSIS — L97219 Non-pressure chronic ulcer of right calf with unspecified severity: Secondary | ICD-10-CM | POA: Diagnosis not present

## 2016-01-03 DIAGNOSIS — L97229 Non-pressure chronic ulcer of left calf with unspecified severity: Secondary | ICD-10-CM | POA: Diagnosis not present

## 2016-01-10 ENCOUNTER — Other Ambulatory Visit: Payer: Self-pay | Admitting: Family Medicine

## 2016-01-11 DIAGNOSIS — L97219 Non-pressure chronic ulcer of right calf with unspecified severity: Secondary | ICD-10-CM | POA: Diagnosis not present

## 2016-01-11 DIAGNOSIS — L97229 Non-pressure chronic ulcer of left calf with unspecified severity: Secondary | ICD-10-CM | POA: Diagnosis not present

## 2016-01-18 DIAGNOSIS — L97229 Non-pressure chronic ulcer of left calf with unspecified severity: Secondary | ICD-10-CM | POA: Diagnosis not present

## 2016-01-18 DIAGNOSIS — L97219 Non-pressure chronic ulcer of right calf with unspecified severity: Secondary | ICD-10-CM | POA: Diagnosis not present

## 2016-01-23 DIAGNOSIS — I83229 Varicose veins of left lower extremity with both ulcer of unspecified site and inflammation: Secondary | ICD-10-CM | POA: Diagnosis not present

## 2016-01-23 DIAGNOSIS — L97219 Non-pressure chronic ulcer of right calf with unspecified severity: Secondary | ICD-10-CM | POA: Diagnosis not present

## 2016-01-23 DIAGNOSIS — L97229 Non-pressure chronic ulcer of left calf with unspecified severity: Secondary | ICD-10-CM | POA: Diagnosis not present

## 2016-01-23 DIAGNOSIS — I83219 Varicose veins of right lower extremity with both ulcer of unspecified site and inflammation: Secondary | ICD-10-CM | POA: Diagnosis not present

## 2016-02-01 ENCOUNTER — Other Ambulatory Visit: Payer: Self-pay | Admitting: Family Medicine

## 2016-02-07 ENCOUNTER — Other Ambulatory Visit: Payer: Self-pay | Admitting: Family Medicine

## 2016-02-07 DIAGNOSIS — I83229 Varicose veins of left lower extremity with both ulcer of unspecified site and inflammation: Secondary | ICD-10-CM | POA: Diagnosis not present

## 2016-02-07 DIAGNOSIS — I83219 Varicose veins of right lower extremity with both ulcer of unspecified site and inflammation: Secondary | ICD-10-CM | POA: Diagnosis not present

## 2016-02-07 DIAGNOSIS — L97219 Non-pressure chronic ulcer of right calf with unspecified severity: Secondary | ICD-10-CM | POA: Diagnosis not present

## 2016-02-07 DIAGNOSIS — L97229 Non-pressure chronic ulcer of left calf with unspecified severity: Secondary | ICD-10-CM | POA: Diagnosis not present

## 2016-02-12 DIAGNOSIS — Z7409 Other reduced mobility: Secondary | ICD-10-CM | POA: Diagnosis not present

## 2016-02-12 DIAGNOSIS — M86669 Other chronic osteomyelitis, unspecified tibia and fibula: Secondary | ICD-10-CM | POA: Diagnosis not present

## 2016-02-27 DIAGNOSIS — L97219 Non-pressure chronic ulcer of right calf with unspecified severity: Secondary | ICD-10-CM | POA: Diagnosis not present

## 2016-02-27 DIAGNOSIS — L97229 Non-pressure chronic ulcer of left calf with unspecified severity: Secondary | ICD-10-CM | POA: Diagnosis not present

## 2016-02-27 DIAGNOSIS — I83229 Varicose veins of left lower extremity with both ulcer of unspecified site and inflammation: Secondary | ICD-10-CM | POA: Diagnosis not present

## 2016-02-27 DIAGNOSIS — I83219 Varicose veins of right lower extremity with both ulcer of unspecified site and inflammation: Secondary | ICD-10-CM | POA: Diagnosis not present

## 2016-03-06 ENCOUNTER — Other Ambulatory Visit: Payer: Self-pay | Admitting: Family Medicine

## 2016-03-11 ENCOUNTER — Telehealth: Payer: Self-pay | Admitting: Family Medicine

## 2016-03-11 NOTE — Telephone Encounter (Signed)
Contacted pt and scheduled him an appointment for 03/29/16.  Katharina Caper, Nhia Heaphy D, [place]

## 2016-03-26 DIAGNOSIS — B351 Tinea unguium: Secondary | ICD-10-CM | POA: Diagnosis not present

## 2016-03-26 DIAGNOSIS — I83219 Varicose veins of right lower extremity with both ulcer of unspecified site and inflammation: Secondary | ICD-10-CM | POA: Diagnosis not present

## 2016-03-26 DIAGNOSIS — I83229 Varicose veins of left lower extremity with both ulcer of unspecified site and inflammation: Secondary | ICD-10-CM | POA: Diagnosis not present

## 2016-03-26 DIAGNOSIS — L97219 Non-pressure chronic ulcer of right calf with unspecified severity: Secondary | ICD-10-CM | POA: Diagnosis not present

## 2016-03-26 DIAGNOSIS — L97229 Non-pressure chronic ulcer of left calf with unspecified severity: Secondary | ICD-10-CM | POA: Diagnosis not present

## 2016-03-29 ENCOUNTER — Ambulatory Visit (INDEPENDENT_AMBULATORY_CARE_PROVIDER_SITE_OTHER): Payer: PPO | Admitting: Family Medicine

## 2016-03-29 ENCOUNTER — Encounter: Payer: Self-pay | Admitting: Family Medicine

## 2016-03-29 VITALS — BP 149/47 | HR 54 | Temp 98.3°F | Ht 72.0 in

## 2016-03-29 DIAGNOSIS — C61 Malignant neoplasm of prostate: Secondary | ICD-10-CM

## 2016-03-29 DIAGNOSIS — E785 Hyperlipidemia, unspecified: Secondary | ICD-10-CM

## 2016-03-29 DIAGNOSIS — E1140 Type 2 diabetes mellitus with diabetic neuropathy, unspecified: Secondary | ICD-10-CM | POA: Diagnosis not present

## 2016-03-29 DIAGNOSIS — E1142 Type 2 diabetes mellitus with diabetic polyneuropathy: Secondary | ICD-10-CM

## 2016-03-29 DIAGNOSIS — I10 Essential (primary) hypertension: Secondary | ICD-10-CM

## 2016-03-29 DIAGNOSIS — A46 Erysipelas: Secondary | ICD-10-CM

## 2016-03-29 DIAGNOSIS — I890 Lymphedema, not elsewhere classified: Secondary | ICD-10-CM | POA: Insufficient documentation

## 2016-03-29 DIAGNOSIS — IMO0002 Reserved for concepts with insufficient information to code with codable children: Secondary | ICD-10-CM

## 2016-03-29 DIAGNOSIS — M86669 Other chronic osteomyelitis, unspecified tibia and fibula: Secondary | ICD-10-CM

## 2016-03-29 DIAGNOSIS — E1165 Type 2 diabetes mellitus with hyperglycemia: Secondary | ICD-10-CM

## 2016-03-29 LAB — POCT GLYCOSYLATED HEMOGLOBIN (HGB A1C): HEMOGLOBIN A1C: 11.6

## 2016-03-29 NOTE — Patient Instructions (Signed)
Thank you for coming in,   I will call or send a letter with the results from today.   Please follow up with me in two weeks.   Please bring all of your medications with you to each visit.   Sign up for My Chart to have easy access to your labs results, and communication with your Primary care physician   Please feel free to call with any questions or concerns at any time, at [phone]. --Dr. Raeford Razor  Diet Recommendations for Diabetes   Starchy (carb) foods include: Bread, rice, pasta, potatoes, corn, crackers, bagels, muffins, all baked goods.   Protein foods include: Meat, fish, poultry, eggs, dairy foods, and beans such as pinto and kidney beans (beans also provide carbohydrate).   1. Eat at least 3 meals and 1-2 snacks per day. Never go more than 4-5 hours while awake without eating.  2. Limit starchy foods to TWO per meal and ONE per snack. ONE portion of a starchy  food is equal to the following:   - ONE slice of bread (or its equivalent, such as half of a hamburger bun).   - 1/2 cup of a "scoopable" starchy food such as potatoes or rice.   - 1 OUNCE (28 grams) of starchy snack foods such as crackers or pretzels (look on label).   - 15 grams of carbohydrate as shown on food label.  3. Both lunch and dinner should include a protein food, a carb food, and vegetables.   - Obtain twice as many veg's as protein or carbohydrate foods for both lunch and dinner.   - Try to keep frozen veg's on hand for a quick vegetable serving.     - Fresh or frozen veg's are best.  4. Breakfast should always include protein.

## 2016-03-29 NOTE — Assessment & Plan Note (Signed)
Controlled  May need to decrease his metoprolol with bradycardia  His diastolic is also low so may need to change other meds as well  - advised that he have closer follow up but he seems reluctant

## 2016-03-29 NOTE — Assessment & Plan Note (Signed)
He is able to place to lotion on his feet but doesn't have anyone taking off the woody change Dr. Sharol Given doesn't seem to be focusing on this.  - will try to get home health nursing to do some wound care

## 2016-03-29 NOTE — Assessment & Plan Note (Signed)
Controlled as of last check  - lipid panel today

## 2016-03-29 NOTE — Assessment & Plan Note (Signed)
Seems resistant to follow up with urology based on his co-pay  - PSA and testosterone today

## 2016-03-29 NOTE — Assessment & Plan Note (Signed)
Being followed monthly by Dr. Sharol Given  Reports this is an improvement

## 2016-03-29 NOTE — Progress Notes (Signed)
Subjective:    [name] - 75 y.o. male MRN [id number]  Date of birth: Mar 13, 1941  CC Dm2  HPI  [name] is here for [facility].  CHRONIC DIABETES Disease Monitoring: doesn't check   Blood Sugar Ranges:   Polyuria: no   Visual problems: no   Medication Compliance: yes  Medication Side Effects  Hypoglycemia: no   Preventitive Health Care  Eye Exam: hasn't had one in 2 years.   Foot Exam: today   Diet pattern: eating a lot more green vegetables.   Exercise: not doing anything.  HTN Disease Monitoring: Home BP Monitoring none  Medications:metoprolol, maxide  Chest pain- no     Dyspnea- no Compliance-  yes.  Lightheadedness-  no   Edema- no  HYPERLIPIDEMIA Symptoms Chest pain on exertion:  no   . Leg claudication:   no Medications: pravastatin  Compliance- yes  Right upper quadrant pain- no   Muscle aches- no     Component Value Date/Time   CHOL 131 05/09/2014 1503   TRIG 45 05/09/2014 1503   HDL 39* 05/09/2014 1503   LDLDIRECT 81 05/05/2012 1157   VLDL 9 05/09/2014 1503   CHOLHDL 3.4 05/09/2014 1503    Chronic Osteomyelitis   He is going to Dr. Sharol Given about once a month which is an improvement  He no longer goes to wound care   Wears compression hose  Dr. Jess Barters office also takes care of his toe nails.   History of prostate cancer  He is unable to afford his co pay to the urologist  Diagnosed 5 or 6 years ago.  Unsure of the type of cancer  Was previously receiving the testosterone injections.  Hasn't received one in about 2 years.    Health Maintenance:  - foot exam today  Health Maintenance Due  Topic Date Due  . FOOT EXAM  11/04/2014  . OPHTHALMOLOGY EXAM  02/07/2015  . URINE MICROALBUMIN  02/07/2015  . INFLUENZA VACCINE  07/31/2015  . HEMOGLOBIN A1C  12/31/2015    Review of Systems See HPI     Objective:   Physical Exam BP 149/47 mmHg  Pulse 54  Temp(Src) 98.3 F (36.8 C) (Oral)  Ht 6' (1.829 m)  Wt  Gen: NAD, alert,  cooperative with exam, morbidly obese, sitting in power chair  CV: bradycardia, regular rhythm, good S1/S2, no murmur,  Resp: CTABL, no wheezes, non-labored Skin: wearing compression wraps on b/l LE, having woody edema b/l, severe onychomycosis  Neuro: no gross deficits.  Psych: alert and oriented    Assessment & Plan:   Essential hypertension Controlled  May need to decrease his metoprolol with bradycardia  His diastolic is also low so may need to change other meds as well  - advised that he have closer follow up but he seems reluctant   DM type 2, uncontrolled, with neuropathy ([place]) Uncontrolled with worsening A1c  Doesn't bring blood sugar measurements and doesn't appear motivated to take them  - advised to follow up in two weeks with blood sugar measurements so his insulin can be titrate   HLD (hyperlipidemia) Controlled as of last check  - lipid panel today   Chronic osteomyelitis of lower leg ([place]) Being followed monthly by Dr. Sharol Given  Reports this is an improvement   ADENOCARCINOMA, PROSTATE Seems resistant to follow up with urology based on his co-pay  - PSA and testosterone today   Elephantiasis nostras He is able to place to lotion on his feet but  doesn't have anyone taking off the woody change Dr. Sharol Given doesn't seem to be focusing on this.  - will try to get home health nursing to do some wound care

## 2016-03-29 NOTE — Assessment & Plan Note (Signed)
Uncontrolled with worsening A1c  Doesn't bring blood sugar measurements and doesn't appear motivated to take them  - advised to follow up in two weeks with blood sugar measurements so his insulin can be titrate

## 2016-03-30 LAB — PSA, MEDICARE: PSA: 25.76 ng/mL — ABNORMAL HIGH (ref <=4.00)

## 2016-03-30 LAB — TESTOSTERONE: Testosterone: 137 ng/dL — ABNORMAL LOW (ref 250–827)

## 2016-04-03 ENCOUNTER — Other Ambulatory Visit: Payer: Self-pay | Admitting: Family Medicine

## 2016-04-03 DIAGNOSIS — Z8546 Personal history of malignant neoplasm of prostate: Secondary | ICD-10-CM

## 2016-04-04 NOTE — Telephone Encounter (Signed)
Discussed with patient about his results. He agrees to be seen by urology for his possible worsening of his prostate cancer.   Rosemarie Ax, MD PGY-3, [facility] 04/04/2016, 10:35 AM

## 2016-04-11 ENCOUNTER — Ambulatory Visit (INDEPENDENT_AMBULATORY_CARE_PROVIDER_SITE_OTHER): Payer: PPO | Admitting: Family Medicine

## 2016-04-11 ENCOUNTER — Encounter: Payer: Self-pay | Admitting: Family Medicine

## 2016-04-11 VITALS — BP 160/61 | HR 58 | Temp 97.8°F | Ht 72.0 in

## 2016-04-11 DIAGNOSIS — IMO0002 Reserved for concepts with insufficient information to code with codable children: Secondary | ICD-10-CM

## 2016-04-11 DIAGNOSIS — A46 Erysipelas: Secondary | ICD-10-CM

## 2016-04-11 DIAGNOSIS — I890 Lymphedema, not elsewhere classified: Secondary | ICD-10-CM

## 2016-04-11 DIAGNOSIS — E1140 Type 2 diabetes mellitus with diabetic neuropathy, unspecified: Secondary | ICD-10-CM

## 2016-04-11 DIAGNOSIS — E1165 Type 2 diabetes mellitus with hyperglycemia: Secondary | ICD-10-CM | POA: Diagnosis not present

## 2016-04-11 MED ORDER — GLUCOSE BLOOD VI STRP: ORAL_STRIP | Status: DC

## 2016-04-11 MED ORDER — ONETOUCH DELICA LANCING DEV MISC: 75.0000 [IU] | Freq: Two times a day (BID) | Status: DC

## 2016-04-11 NOTE — Patient Instructions (Signed)
Thank you for coming in,   I will order more lancets and strips for you   I will order home health to help with your legs   Please follow up with me in one week.   Please bring all of your medications with you to each visit.   Sign up for My Chart to have easy access to your labs results, and communication with your Primary care physician   Please feel free to call with any questions or concerns at any time, at [phone]. --Dr. Raeford Razor

## 2016-04-11 NOTE — Progress Notes (Signed)
   Subjective:    [name] - 75 y.o. male MRN [id number]  Date of birth: 1941-11-18  CC Dm2  HPI  [name] is here for [facility].  CHRONIC DIABETES He brings his meter in today but he has not measurements to display His lancet device is not working correctly either  He cannot tell me what his blood sugars have been.  Disease Monitoring: doesn't check   Blood Sugar Ranges:   Polyuria: no   Visual problems: no   Medication Compliance: yes  Medication Side Effects  Hypoglycemia: no   Preventitive Health Care  Eye Exam: hasn't had one in 2 years.   Foot Exam: today   Diet pattern: eating a lot more green vegetables.   Exercise: not doing anything.   Elephantiasis nostras  His lower leg are having chronic change  He doesn't have anyone to apply a cream or remove the keratin.  Is seen by Dr. Sharol Given but for his chronic osteo.   Health Maintenance:   Health Maintenance Due  Topic Date Due  . OPHTHALMOLOGY EXAM  02/07/2015  . URINE MICROALBUMIN  02/07/2015    Review of Systems See HPI     Objective:   Physical Exam BP 160/61 mmHg  Pulse 58  Temp(Src) 97.8 F (36.6 C) (Oral)  Ht 6' (1.829 m)  Wt  Gen: NAD, alert, cooperative with exam, morbidly obese, sitting in power chair  CV: bradycardia, regular rhythm, good S1/S2, no murmur,  Resp: CTABL, no wheezes, non-labored Skin: wearing compression wraps on b/l LE  Neuro: no gross deficits.  Psych: alert and oriented    Assessment & Plan:   Elephantiasis nostras Referral placed to home health in order to debride the woody change in his lower extremities.  - use 20% Urea cream 2-3 times daily and debride as much as possible.  - continue to monitor.   DM type 2, uncontrolled, with neuropathy ([place]) Uncontrolled  His meter and strips are not compatible so unsure when the last time he checked his blood sugar was. His Lancet devices also working correctly. Dr. Valentina Lucks of pharmacy resident in a fair amount of time  discussing with him the need to check his blood sugars as well as providing instruction with how to check his blood sugars. - A new lancing device was sent and as well as strips - Advised follow-up in 1-2 weeks with myself or Dr. Valentina Lucks - Hopefully at that time he has some blood sugar measurements so that we may be better able to titrate his insulin

## 2016-04-11 NOTE — Assessment & Plan Note (Signed)
Referral placed to home health in order to debride the woody change in his lower extremities.  - use 20% Urea cream 2-3 times daily and debride as much as possible.  - continue to monitor.

## 2016-04-11 NOTE — Assessment & Plan Note (Signed)
Uncontrolled  His meter and strips are not compatible so unsure when the last time he checked his blood sugar was. His Lancet devices also working correctly. Dr. Valentina Lucks of pharmacy resident in a fair amount of time discussing with him the need to check his blood sugars as well as providing instruction with how to check his blood sugars. - A new lancing device was sent and as well as strips - Advised follow-up in 1-2 weeks with myself or Dr. Valentina Lucks - Hopefully at that time he has some blood sugar measurements so that we may be better able to titrate his insulin

## 2016-04-16 ENCOUNTER — Telehealth: Payer: Self-pay | Admitting: Family Medicine

## 2016-04-16 NOTE — Telephone Encounter (Signed)
Spoke with patient about home health. He agreed to having them come 1 or 2 time to show him the care that needs to be done to his legs then he will either come to clinic to have further debridement or have a family member help him.   Rosemarie Ax, MD PGY-3, [facility] 04/16/2016, 10:25 AM

## 2016-04-25 ENCOUNTER — Ambulatory Visit: Payer: PPO | Admitting: Pharmacist

## 2016-05-01 ENCOUNTER — Other Ambulatory Visit: Payer: Self-pay | Admitting: Family Medicine

## 2016-05-02 ENCOUNTER — Encounter: Payer: Self-pay | Admitting: Pharmacist

## 2016-05-02 ENCOUNTER — Ambulatory Visit (INDEPENDENT_AMBULATORY_CARE_PROVIDER_SITE_OTHER): Payer: PPO | Admitting: Pharmacist

## 2016-05-02 DIAGNOSIS — E1140 Type 2 diabetes mellitus with diabetic neuropathy, unspecified: Secondary | ICD-10-CM

## 2016-05-02 DIAGNOSIS — IMO0002 Reserved for concepts with insufficient information to code with codable children: Secondary | ICD-10-CM

## 2016-05-02 DIAGNOSIS — E1165 Type 2 diabetes mellitus with hyperglycemia: Secondary | ICD-10-CM

## 2016-05-02 MED ORDER — INSULIN ASPART 100 UNIT/ML FLEXPEN: 35.0000 [IU] | PEN_INJECTOR | Freq: Two times a day (BID) | SUBCUTANEOUS | Status: DC

## 2016-05-02 MED ORDER — INSULIN DETEMIR 100 UNIT/ML FLEXPEN: 60.0000 [IU] | PEN_INJECTOR | Freq: Two times a day (BID) | SUBCUTANEOUS | Status: DC

## 2016-05-02 NOTE — Patient Instructions (Signed)
Great to see you today.   Change your Levemir to 60 units twice daily.  And   Change your Novolog to 35 units twice daily.   Next visit. Bring meter and all medications with you.   Pharmacy Clinic in 3-4 weeks.

## 2016-05-02 NOTE — Assessment & Plan Note (Signed)
Diabetes longstanding currently with poor control secondary to suboptimal. Patient reports hypoglycemic events and is able to verbalize appropriate hypoglycemia management plan. Patient reports adherence with medication. Control is suboptimal with highly variable readings most likely related to unequal balance of basal/bolus insulin regimen.  Decreased dose of basal insulin Levemir (insulin detemir) from 75 units twice daily to 60 units twice daily. Increased dose of rapid insulin Novolog (insulin aspart) from 20 units twice daily to 35 units twice daily. Reevaluate blood sugars in 3-4 weeks.  Next A1C anticipated 2-3 months.

## 2016-05-02 NOTE — Progress Notes (Signed)
S:    Patient arrives in good spirits, riding in his scooter.  Presents for diabetes evaluation, education, and management at the request of Cr. Raeford Razor. Patient was referred on 04/11/2016.   Patient was last seen by Primary Care Provider on 04/11/2016.  Patient reports adherence with medications.  Current diabetes medications include: Current hypertension medications include:   Patient reports hypoglycemic events - has had two episodes of hypoglycemia (BG 65 and 66) during the middle of the night.  Patient reported dietary habits: Eats 2 meals/day Breakfast (8-11AM): sausage and egg McMuffin and coffee (cream and sugar - 3)  Takes insulin with this meal Dinner(5PM): stir-fry (beef, vegetables, no rice)  Snacks: pear, orange Drinks: diet coke, buttermilk (3 glasses/day)  Patient reported exercise habits: walks with crutches - exercise is limited    Patient reports nocturia (4-5 times per night).  Patient denies neuropathy. Patient denies visual changes.   O:  Lab Results  Component Value Date   HGBA1C 11.6 03/29/2016   Filed Vitals:   05/02/16 1048  BP: 154/55  Pulse: 59    Home fasting CBG: 66 - 290 Meter Date was reset at today's visit.   A/P: Diabetes longstanding currently with poor control secondary to suboptimal. Patient reports hypoglycemic events and is able to verbalize appropriate hypoglycemia management plan. Patient reports adherence with medication. Control is suboptimal with highly variable readings most likely related to unequal balance of basal/bolus insulin regimen.  Decreased dose of basal insulin Levemir (insulin detemir) from 75 units twice daily to 60 units twice daily. Increased dose of rapid insulin Novolog (insulin aspart) from 20 units twice daily to 35 units twice daily. Reevaluate blood sugars in 3-4 weeks.  Next A1C anticipated 2-3 months.  ASCVD risk greater than 7.5%. Continued Aspirin 81 mg and Continued Pravastatin 40mg .   Hypertension  longstanding currently near goal, however with his wide pulse pressure decided to NOT make any change today.   Written patient instructions provided.  Total time in face to face counseling 35 minutes.   Follow up in Pharmacist Clinic Visit 3-4 weeks.   Patient seen with Zettie Cooley, PharmD Candidate, Cruz Condon, PharmD Resident and Elisabeth Most, PharmD Resident.

## 2016-05-02 NOTE — Progress Notes (Signed)
Patient ID: [name], male   DOB: August 15, 1941, 75 y.o.   MRN: [id number] Reviewed: Agree with Dr. Graylin Shiver documentation and management.

## 2016-05-23 ENCOUNTER — Encounter: Payer: Self-pay | Admitting: Pharmacist

## 2016-05-23 ENCOUNTER — Ambulatory Visit (INDEPENDENT_AMBULATORY_CARE_PROVIDER_SITE_OTHER): Payer: PPO | Admitting: Pharmacist

## 2016-05-23 VITALS — BP 148/59 | HR 55

## 2016-05-23 DIAGNOSIS — I10 Essential (primary) hypertension: Secondary | ICD-10-CM

## 2016-05-23 DIAGNOSIS — E1140 Type 2 diabetes mellitus with diabetic neuropathy, unspecified: Secondary | ICD-10-CM

## 2016-05-23 DIAGNOSIS — IMO0002 Reserved for concepts with insufficient information to code with codable children: Secondary | ICD-10-CM

## 2016-05-23 DIAGNOSIS — E1165 Type 2 diabetes mellitus with hyperglycemia: Secondary | ICD-10-CM | POA: Diagnosis not present

## 2016-05-23 MED ORDER — INSULIN DETEMIR 100 UNIT/ML FLEXPEN: 50.0000 [IU] | PEN_INJECTOR | Freq: Two times a day (BID) | SUBCUTANEOUS | Status: DC

## 2016-05-23 MED ORDER — INSULIN ASPART 100 UNIT/ML FLEXPEN: 45.0000 [IU] | PEN_INJECTOR | Freq: Two times a day (BID) | SUBCUTANEOUS | Status: DC

## 2016-05-23 NOTE — Patient Instructions (Signed)
Thank you for coming in today!  Decrease your Levemir dose to 50 units twice a day.   Increase your Novolog dose to 45 units twice a day - take this medication 10-15 minutes before your breakfast and supper.   Follow up with Dr. Valentina Lucks in one month.

## 2016-05-23 NOTE — Progress Notes (Signed)
S: Patient arrives in good spirits, riding in on his scooter.  Presents for diabetes follow-up. Patient was referred on 04/11/16.  Patient was last seen by Primary Care Provider on 04/11/16.   Patient reports adherence with medications.  Current diabetes medications include: Levemir 60 units BID, Novolog 35 units BID Current hypertension medications include: furosemide, metoprolol, triamterene-HCTZ  Patient reports hypoglycemic events. A reading of 79 and a reading of 88. The reading of 79 was in the middle of the night and the other one was yesterday morning before he had eaten.  Patient reported exercise habits: limited - patient is only mobile with use of a scooter    O:  Lab Results  Component Value Date   HGBA1C 11.6 03/29/2016   Filed Vitals:   05/23/16 1104  BP: 148/59  Pulse: 55   Home average CBG: 200-250    A/P: Diabetes longstanding, control is suboptimal. Patient reports hypoglycemic events although he did not experience true hypoglycemia and is able to verbalize appropriate hypoglycemia management plan. Patient reports adherence with medication. Control is suboptimal due to optimization of insulin regimen.  Decreased dose of basal insulin Levemir to 50 units BID. Increased dose of rapid insulin Novolog (insulin aspart) to 45 units BID with meals.  Reevaluate blood sugars in 3-4 weeks.  Next A1C anticipated in 1-2 months.    ASCVD risk greater than 7.5%. Continued Aspirin 81 mg and Continued pravastatin 40 mg.   Hypertension longstanding currently at relaxed goal of <150/90.  Pulse with repeated readings of < 60bpm.    Without symptoms of bradycardia.   Consider decrease in metoprolol dose at upcoming visit.   Written patient instructions provided.  Total time in face to face counseling 30 minutes.  Follow up in Pharmacist Clinic Visit in one month.   Patient seen with Cruz Condon, PharmD Resident and Elisabeth Most, PharmD Resident.

## 2016-05-23 NOTE — Assessment & Plan Note (Signed)
Diabetes longstanding, control is suboptimal. Patient reports hypoglycemic events although he did not experience true hypoglycemia and is able to verbalize appropriate hypoglycemia management plan. Patient reports adherence with medication. Control is suboptimal due to optimization of insulin regimen.  Decreased dose of basal insulin Levemir to 50 units BID. Increased dose of rapid insulin Novolog (insulin aspart) to 45 units BID with meals.  Reevaluate blood sugars in 3-4 weeks.  Next A1C anticipated in 1-2 months.

## 2016-05-23 NOTE — Progress Notes (Signed)
Patient ID: [name], male   DOB: August 15, 1941, 75 y.o.   MRN: [id number] Reviewed: Agree with Dr. Graylin Shiver documentation and management.

## 2016-05-23 NOTE — Assessment & Plan Note (Signed)
Hypertension longstanding currently at relaxed goal of <150/90.  Pulse with repeated readings of < 60bpm.    Without symptoms of bradycardia.   Consider decrease in metoprolol dose at upcoming visit.

## 2016-05-30 ENCOUNTER — Other Ambulatory Visit: Payer: Self-pay | Admitting: Family Medicine

## 2016-06-26 ENCOUNTER — Other Ambulatory Visit: Payer: Self-pay | Admitting: Family Medicine

## 2016-07-23 ENCOUNTER — Telehealth: Payer: Self-pay | Admitting: Family Medicine

## 2016-07-23 NOTE — Telephone Encounter (Signed)
Invited him to attend the [facility] clinic next Thursday.  He is busy.    Did encourage him to make a regular appointment with Dr Emmaline Life.  He agrees  [employer]

## 2016-07-24 ENCOUNTER — Other Ambulatory Visit: Payer: Self-pay | Admitting: Family Medicine

## 2016-08-21 ENCOUNTER — Other Ambulatory Visit: Payer: Self-pay | Admitting: Family Medicine

## 2016-08-21 ENCOUNTER — Other Ambulatory Visit: Payer: Self-pay | Admitting: Internal Medicine

## 2016-09-18 ENCOUNTER — Other Ambulatory Visit: Payer: Self-pay | Admitting: Internal Medicine

## 2016-10-16 ENCOUNTER — Other Ambulatory Visit: Payer: Self-pay | Admitting: Family Medicine

## 2016-10-16 ENCOUNTER — Encounter (HOSPITAL_COMMUNITY): Payer: Self-pay | Admitting: Emergency Medicine

## 2016-10-16 ENCOUNTER — Emergency Department (HOSPITAL_COMMUNITY)
Admission: EM | Admit: 2016-10-16 | Discharge: 2016-10-16 | Disposition: A | Discharge status: Home / Self Care | Payer: PPO | Attending: Emergency Medicine | Admitting: Emergency Medicine

## 2016-10-16 ENCOUNTER — Other Ambulatory Visit: Payer: Self-pay | Admitting: Internal Medicine

## 2016-10-16 DIAGNOSIS — E11649 Type 2 diabetes mellitus with hypoglycemia without coma: Secondary | ICD-10-CM | POA: Diagnosis not present

## 2016-10-16 DIAGNOSIS — I10 Essential (primary) hypertension: Secondary | ICD-10-CM | POA: Insufficient documentation

## 2016-10-16 DIAGNOSIS — N139 Obstructive and reflux uropathy, unspecified: Secondary | ICD-10-CM

## 2016-10-16 DIAGNOSIS — Z8546 Personal history of malignant neoplasm of prostate: Secondary | ICD-10-CM | POA: Insufficient documentation

## 2016-10-16 DIAGNOSIS — Z79899 Other long term (current) drug therapy: Secondary | ICD-10-CM | POA: Diagnosis not present

## 2016-10-16 DIAGNOSIS — E1165 Type 2 diabetes mellitus with hyperglycemia: Principal | ICD-10-CM

## 2016-10-16 DIAGNOSIS — Z7982 Long term (current) use of aspirin: Secondary | ICD-10-CM | POA: Diagnosis not present

## 2016-10-16 DIAGNOSIS — IMO0002 Reserved for concepts with insufficient information to code with codable children: Secondary | ICD-10-CM

## 2016-10-16 DIAGNOSIS — R339 Retention of urine, unspecified: Secondary | ICD-10-CM | POA: Diagnosis not present

## 2016-10-16 DIAGNOSIS — E1365 Other specified diabetes mellitus with hyperglycemia: Secondary | ICD-10-CM | POA: Diagnosis not present

## 2016-10-16 DIAGNOSIS — Z794 Long term (current) use of insulin: Secondary | ICD-10-CM | POA: Diagnosis not present

## 2016-10-16 DIAGNOSIS — R7889 Finding of other specified substances, not normally found in blood: Secondary | ICD-10-CM | POA: Diagnosis not present

## 2016-10-16 DIAGNOSIS — E1140 Type 2 diabetes mellitus with diabetic neuropathy, unspecified: Secondary | ICD-10-CM

## 2016-10-16 DIAGNOSIS — J449 Chronic obstructive pulmonary disease, unspecified: Secondary | ICD-10-CM | POA: Diagnosis not present

## 2016-10-16 DIAGNOSIS — R32 Unspecified urinary incontinence: Secondary | ICD-10-CM | POA: Diagnosis not present

## 2016-10-16 DIAGNOSIS — R7309 Other abnormal glucose: Secondary | ICD-10-CM | POA: Diagnosis not present

## 2016-10-16 LAB — CBC WITH DIFFERENTIAL/PLATELET
Basophils Absolute: 0 10*3/uL (ref 0.0–0.1)
Basophils Relative: 0 %
EOS ABS: 0.2 10*3/uL (ref 0.0–0.7)
EOS PCT: 3 %
HCT: 39.3 % (ref 39.0–52.0)
HEMOGLOBIN: 12.4 g/dL — AB (ref 13.0–17.0)
LYMPHS ABS: 1.2 10*3/uL (ref 0.7–4.0)
Lymphocytes Relative: 15 %
MCH: 26.2 pg (ref 26.0–34.0)
MCHC: 31.6 g/dL (ref 30.0–36.0)
MCV: 82.9 fL (ref 78.0–100.0)
MONO ABS: 0.6 10*3/uL (ref 0.1–1.0)
MONOS PCT: 7 %
Neutro Abs: 5.8 10*3/uL (ref 1.7–7.7)
Neutrophils Relative %: 75 %
Platelets: 265 10*3/uL (ref 150–400)
RBC: 4.74 MIL/uL (ref 4.22–5.81)
RDW: 14.7 % (ref 11.5–15.5)
WBC: 7.8 10*3/uL (ref 4.0–10.5)

## 2016-10-16 LAB — COMPREHENSIVE METABOLIC PANEL
ALK PHOS: 189 U/L — AB (ref 38–126)
ALT: 13 U/L — ABNORMAL LOW (ref 17–63)
ANION GAP: 8 (ref 5–15)
AST: 15 U/L (ref 15–41)
Albumin: 3.4 g/dL — ABNORMAL LOW (ref 3.5–5.0)
BUN: 21 mg/dL — ABNORMAL HIGH (ref 6–20)
CALCIUM: 9.2 mg/dL (ref 8.9–10.3)
CO2: 29 mmol/L (ref 22–32)
Chloride: 98 mmol/L — ABNORMAL LOW (ref 101–111)
Creatinine, Ser: 0.99 mg/dL (ref 0.61–1.24)
GFR calc non Af Amer: >60 mL/min (ref >60)
Glucose, Bld: 460 mg/dL — ABNORMAL HIGH (ref 65–99)
Potassium: 4 mmol/L (ref 3.5–5.1)
SODIUM: 135 mmol/L (ref 135–145)
TOTAL PROTEIN: 8.4 g/dL — AB (ref 6.5–8.1)
Total Bilirubin: 0.2 mg/dL — ABNORMAL LOW (ref 0.3–1.2)

## 2016-10-16 LAB — BLOOD GAS, VENOUS
ACID-BASE EXCESS: 6.4 mmol/L — AB (ref 0.0–2.0)
Bicarbonate: 33 mmol/L — ABNORMAL HIGH (ref 20.0–28.0)
FIO2: 0.21
O2 SAT: 34.9 %
PATIENT TEMPERATURE: 98.6
pCO2, Ven: 58.7 mmHg (ref 44.0–60.0)
pH, Ven: 7.368 (ref 7.250–7.430)

## 2016-10-16 LAB — URINE MICROSCOPIC-ADD ON
Bacteria, UA: NONE SEEN
SQUAMOUS EPITHELIAL / LPF: NONE SEEN
WBC, UA: NONE SEEN WBC/hpf (ref 0–5)

## 2016-10-16 LAB — URINALYSIS, ROUTINE W REFLEX MICROSCOPIC
Bilirubin Urine: NEGATIVE
Glucose, UA: >1000 mg/dL — AB
KETONES UR: NEGATIVE mg/dL
LEUKOCYTES UA: NEGATIVE
NITRITE: NEGATIVE
PH: 7.5 (ref 5.0–8.0)
PROTEIN: NEGATIVE mg/dL
Specific Gravity, Urine: 1.015 (ref 1.005–1.030)

## 2016-10-16 LAB — CBG MONITORING, ED
GLUCOSE-CAPILLARY: 529 mg/dL — AB (ref 65–99)
Glucose-Capillary: 333 mg/dL — ABNORMAL HIGH (ref 65–99)

## 2016-10-16 MED ORDER — SODIUM CHLORIDE 0.9 % IV BOLUS (SEPSIS)
1000.0000 mL | Freq: Once | INTRAVENOUS | Status: AC
  Administered 2016-10-16: 1000 mL via INTRAVENOUS

## 2016-10-16 MED ORDER — INSULIN ASPART 100 UNIT/ML ~~LOC~~ SOLN
10.0000 [IU] | Freq: Once | SUBCUTANEOUS | Status: AC
  Administered 2016-10-16: 10 [IU] via SUBCUTANEOUS
  Filled 2016-10-16: qty 1

## 2016-10-16 NOTE — ED Notes (Signed)
Patient verbalizes understanding of discharge instructions, home care and follow up care. Patient discharged home via [place].

## 2016-10-16 NOTE — ED Provider Notes (Signed)
[name] DEPT Provider Note   CSN: [id number] Arrival date & time: 10/16/16  1803     History   Chief Complaint Chief Complaint  Patient presents with  . Urinary Incontinence  . Hyperglycemia    HPI [name] is a 75 y.o. male.  [name] is a 75 yo man with PMH DM2, HTN, obesity, prostate cancer (previously on hormone shots, no treatment in years) who presents with acute urinary incontinence, lower abdominal pain, and hyperglycemia (537 per EMS) that developed this afternoon. He denies any recent trauma and has been in his normal state of health. He denies dysuria, urgency, but has frequency and had intermittently had urinary obstructive symptoms in the past. His abdominal pain has improved following multiple episodes of urinary incontinence. Patient reports compliance with insulin regimen Levemir 36U BID and Novolog 20U BID, reported AM sugar 150 today. Endorses constipation, LBM yesterday. Denies pain, N/V/D, fevers/chills, cough, rhinorrhea, energy change, weight loss, pain elsewhere, or recent medication changes.    The history is provided by the patient. No language interpreter was used.    Past Medical History:  Diagnosis Date  . COLON POLYP 02/26/2007   Qualifier: Diagnosis of  By: Erling Cruz  MD, MELISSA    . Complication of anesthesia   . Diabetes mellitus   . GERD (gastroesophageal reflux disease)   . Heart murmur    last 2D Echo -03/31/08  . HIATAL HERNIA WITH REFLUX 09/29/2006   Qualifier: Diagnosis of  By: Erling Cruz  MD, MELISSA    . HIP REPLACEMENT, BILATERAL, HX OF 05/12/2007   Qualifier: Diagnosis of  By: Erling Cruz  MD, MELISSA    . History of blood transfusion   . Hypertension   . OSTEOARTHRITIS 03/05/2007   Qualifier: Diagnosis of  By: Erling Cruz  MD, MELISSA    . PONV (postoperative nausea and vomiting)   . Prostate cancer ([place])   . Seasonal allergies   . VENOUS INSUFFICIENCY, CHRONIC 02/26/2007   Qualifier: Diagnosis of  By: Erling Cruz  MD, MELISSA      Patient Active  Problem List   Diagnosis Date Noted  . Elephantiasis nostras 03/29/2016  . Impaired mobility 09/09/2014  . Diabetic peripheral neuropathy associated with type 2 diabetes mellitus ([place]) 02/07/2014  . Ulcer of left lower leg ([place]) 10/05/2012  . Anemia 05/21/2012  . Chronic leg pain 11/17/2011  . COPD, severity to be determined ([place]) 05/14/2011  . Chronic osteomyelitis of lower leg ([place]) 11/20/2010  . HLD (hyperlipidemia) 12/01/2008  . ADENOCARCINOMA, PROSTATE 05/24/2008  . DM type 2, uncontrolled, with neuropathy ([place]) 01/20/2008  . Morbid obesity with body mass index of 50.0-59.9 in adult ([place]) 03/05/2007  . Essential hypertension 03/05/2007    Past Surgical History:  Procedure Laterality Date  . COLONOSCOPY    . COLONOSCOPY    . HIP ARTHROPLASTY     bil  . I&D EXTREMITY  08/14/2012   Procedure: IRRIGATION AND DEBRIDEMENT EXTREMITY;  Surgeon: Newt Minion, MD;  Location: [facility];  Service: Orthopedics;  Laterality: Right;  Irrigation and Debridement Right tibia, Placement antibiotic beads  . I&D EXTREMITY Left 03/18/2014   Procedure: IRRIGATION AND DEBRIDEMENT EXTREMITY;  Surgeon: Newt Minion, MD;  Location: [facility];  Service: Orthopedics;  Laterality: Left;  Debridement Left Calf Ulcer, Apply Theraskin and Wound VAC  . IRRIGATION AND DEBRIDEMENT ABSCESS Left 03/18/2014   DR DUDA  . LEG SURGERY     ROD for fracture  . LEG WOUND REPAIR / CLOSURE  Beads .  Skin wound  . SKIN GRAFT     right leg- from donor skin  . SKIN GRAFT  1976   R wrist   skin graft from left thigh  . UPPER GASTROINTESTINAL ENDOSCOPY    . WISDOM TOOTH EXTRACTION    . WRIST FUSION     right wrist       Home Medications    Prior to Admission medications   Medication Sig Start Date End Date Taking? Authorizing Provider  aspirin 81 MG chewable tablet Chew 81 mg by mouth every other day.    Yes Historical Provider, MD  Blood Glucose Monitoring Suppl (ONE TOUCH ULTRA SYSTEM KIT) W/DEVICE KIT 1 kit by  Does not apply route once. 11/04/13  Yes Angelica Ran, MD  EASY TOUCH PEN NEEDLES 31G X 8 MM MISC USE WITH FLEXPEN AS DIRECTED 04/04/16  Yes Rosemarie Ax, MD  esomeprazole (NEXIUM) 40 MG capsule TAKE 1 CAPSULE BY MOUTH EVERY DAY 07/25/16  Yes Asiyah Cletis Media, MD  FLOVENT HFA 220 MCG/ACT inhaler INHALE 1 PUFF INTO THE LUNGS TWICE DAILY 07/25/16  Yes Asiyah Cletis Media, MD  fluticasone (CUTIVATE) 0.05 % cream Apply 1 application topically 2 (two) times daily. Reported on 05/23/2016   Yes Historical Provider, MD  furosemide (LASIX) 40 MG tablet TAKE 1 TABLET BY MOUTH TWICE DAILY 08/22/16  Yes Asiyah Cletis Media, MD  glucose blood test strip Use as instructed 04/11/16  Yes Rosemarie Ax, MD  insulin aspart (NOVOLOG FLEXPEN) 100 UNIT/ML FlexPen Inject 45 Units into the skin 2 (two) times daily. Patient taking differently: Inject 30 Units into the skin 2 (two) times daily.  05/23/16  Yes Zenia Resides, MD  Insulin Detemir (LEVEMIR FLEXTOUCH) 100 UNIT/ML Pen Inject 50 Units into the skin 2 (two) times daily. Patient taking differently: Inject 60 Units into the skin 2 (two) times daily.  05/23/16  Yes Zenia Resides, MD  Lancet Devices (ONE TOUCH DELICA LANCING DEV) MISC 75 Units by Does not apply route 2 (two) times daily. 04/11/16  Yes Rosemarie Ax, MD  latanoprost (XALATAN) 0.005 % ophthalmic solution INSTILL 1 DROP IN BOTH EYES EVERY EVENING FOR GLAUCOMA 12/07/15  Yes Rosemarie Ax, MD  metoprolol (LOPRESSOR) 100 MG tablet TAKE 1 TABLET BY MOUTH TWICE DAILY 08/22/16  Yes Asiyah Cletis Media, MD  mometasone (NASONEX) 50 MCG/ACT nasal spray PLACE 2 SPRAYS INTO THE NOSE DAILY 07/25/16  Yes Asiyah Cletis Media, MD  mupirocin ointment (BACTROBAN) 2 % Apply 1 application topically 2 (two) times daily. To legs   Yes Historical Provider, MD  ONE TOUCH ULTRA TEST test strip USE TO CHECK BLOOD SUGAR FOUR TIMES A DAY 09/22/15  Yes Rosemarie Ax, MD  [place] LANCETS 53Z MISC USE TO CHECK  BLOOD SUGAR TWICE DAILY 08/22/16  Yes Asiyah Cletis Media, MD  pravastatin (PRAVACHOL) 40 MG tablet TAKE 1 TABLET BY MOUTH DAILY 12/07/15  Yes Rosemarie Ax, MD  PROAIR HFA 108 [phone] Base) MCG/ACT inhaler INHALE 2 PUFFS BY MOUTH EVERY 4 HOURS AS NEEDED FOR WHEEZING OR SHORTNESS OF BREATH. 08/22/16  Yes Asiyah Cletis Media, MD  silver sulfADIAZINE (SILVADENE) 1 % cream APPLY AS DIRECTED TO WOUND EVERY DAY 09/19/16  Yes Asiyah Cletis Media, MD  tamsulosin (FLOMAX) 0.4 MG CAPS capsule TAKE 2 CAPSULES BY MOUTH EVERY MORNING 08/22/16  Yes Asiyah Cletis Media, MD  traMADol (ULTRAM) 50 MG tablet TAKE 1 TABLET BY MOUTH TWICE DAILY AS NEEDED FOR PAIN  06/03/14  Yes Angelica Ran, MD  triamcinolone ointment (KENALOG) 0.1 % Apply 1 application topically 2 (two) times daily as needed (to legs).  05/22/16  Yes Historical Provider, MD  triamterene-hydrochlorothiazide (MAXZIDE-25) 37.5-25 MG tablet TAKE 1 TABLET BY MOUTH EVERY DAY FOR BLOOD PRESSURE 08/22/16  Yes Asiyah Cletis Media, MD    Family History Family History  Problem Relation Age of Onset  . Hypertension Mother     Social History Social History  Substance Use Topics  . Smoking status: Never Smoker  . Smokeless tobacco: Never Used  . Alcohol use No     Allergies   Ace inhibitors; Angiotensin receptor blockers; and Metformin   Review of Systems Review of Systems  Constitutional: Negative for appetite change, chills, fatigue and fever.  Respiratory: Negative for cough and shortness of breath.   Cardiovascular: Negative for chest pain.  Gastrointestinal: Positive for abdominal pain. Negative for abdominal distention, constipation, diarrhea, nausea and vomiting.  Endocrine: Positive for polydipsia and polyuria.  Genitourinary: Positive for difficulty urinating, frequency and urgency. Negative for dysuria, flank pain, hematuria and testicular pain.  Neurological: Negative for dizziness, syncope and light-headedness.  All other systems  reviewed and are negative.    Physical Exam Updated Vital Signs BP 130/67 (BP Location: Left Arm)   Pulse 87   Temp 98.2 F (36.8 C) (Oral)   Resp 18   Ht 6' (1.829 m)   Wt (!) 154.2 kg   SpO2 98%   BMI 46.11 kg/m   Physical Exam  Constitutional: He is oriented to person, place, and time. No distress.  Morbidly obese, malodorous  HENT:  Head: Normocephalic and atraumatic.  Mouth/Throat: Oropharynx is clear and moist.  Eyes: EOM are normal. Pupils are equal, round, and reactive to light.  Neck: Normal range of motion. Neck supple.  Cardiovascular: Normal rate, regular rhythm and normal heart sounds.  Exam reveals no gallop and no friction rub.   No murmur heard. Auscultation limited by body habitus, 2+ upper extremity pulses, trace lower extremity limited by chronic woody changes of legs  Pulmonary/Chest: Effort normal and breath sounds normal. No respiratory distress. He has no wheezes. He has no rales.  Abdominal: Soft. Bowel sounds are normal. He exhibits no distension and no mass. There is tenderness (suprapubic and lower quadrants). There is no rebound and no guarding.  obese  Musculoskeletal: Normal range of motion. He exhibits deformity (woody changes of lower extremities).  Neurological: He is alert and oriented to person, place, and time.  Skin: Skin is warm and dry. Capillary refill takes less than 2 seconds. He is not diaphoretic.  Psychiatric: He has a normal mood and affect. His behavior is normal. Thought content normal.  Nursing note and vitals reviewed.    ED Treatments / Results  Labs (all labs ordered are listed, but only abnormal results are displayed) Labs Reviewed  CBC WITH DIFFERENTIAL/PLATELET - Abnormal; Notable for the following:       Result Value   Hemoglobin 12.4 (*)    All other components within normal limits  COMPREHENSIVE METABOLIC PANEL - Abnormal; Notable for the following:    Chloride 98 (*)    Glucose, Bld 460 (*)    BUN 21 (*)     Total Protein 8.4 (*)    Albumin 3.4 (*)    ALT 13 (*)    Alkaline Phosphatase 189 (*)    Total Bilirubin 0.2 (*)    All other components within normal limits  URINALYSIS, ROUTINE W  REFLEX MICROSCOPIC (NOT AT [facility]) - Abnormal; Notable for the following:    Glucose, UA >1000 (*)    Hgb urine dipstick TRACE (*)    All other components within normal limits  BLOOD GAS, VENOUS - Abnormal; Notable for the following:    Bicarbonate 33.0 (*)    Acid-Base Excess 6.4 (*)    All other components within normal limits  CBG MONITORING, ED - Abnormal; Notable for the following:    Glucose-Capillary 529 (*)    All other components within normal limits  CBG MONITORING, ED - Abnormal; Notable for the following:    Glucose-Capillary 333 (*)    All other components within normal limits  URINE CULTURE  URINE MICROSCOPIC-ADD ON    EKG  EKG Interpretation None       Radiology No results found.  Procedures Procedures (including critical care time)  Medications Ordered in ED Medications  sodium chloride 0.9 % bolus 1,000 mL (0 mLs Intravenous Stopped 10/16/16 2037)  insulin aspart (novoLOG) injection 10 Units (10 Units Subcutaneous Given 10/16/16 2036)     Initial Impression / Assessment and Plan / ED Course  I have reviewed the triage vital signs and the nursing notes.  Pertinent labs & imaging results that were available during my care of the patient were reviewed by me and considered in my medical decision making (see chart for details).  Clinical Course   Mr. Gibler is a 75yo gentleman with DM2, prostate cancer, elephantiasis who presents with urinary incontinence and hyperglycemia to 500+ found to have urinary retention and overflow incontinence after bladder scan revealed significant post-void residual volume and foley catheter relieved him of 300+ cc's. Abdominal pain resolved after voiding. Labs remarkable ofr glucosuria and hyperglycemia, provided 1L IVF and 10U Novolog. Etiology of  urinary retention likely BPH vs growth of prostate cancer (treated with hormones only in remote past). Patient discharged with foley in place and provided information for Urology and PCP follow up, provided clothing and transportation.   Final Clinical Impressions(s) / ED Diagnoses   Final diagnoses:  Urinary obstruction    New Prescriptions Discharge Medication List as of 10/16/2016 11:03 PM       Asencion Partridge, MD 10/17/16 2101    Drenda Freeze, MD 10/18/16 1446

## 2016-10-16 NOTE — ED Notes (Signed)
Bed: RESA Expected date:  Expected time:  Means of arrival:  Comments: EMS-[facility]I

## 2016-10-16 NOTE — ED Notes (Signed)
[facility] notified of need for transport back to residence

## 2016-10-16 NOTE — ED Triage Notes (Signed)
Pt brought via EMS to ED with urinary incontinence and hyperglycemia.  Pt originally picked up by BLS team but was transferred over when sugar was 537.  Pt reports that the incontinence has increased in frequency until he's dribbling almost every five minutes.  {t has hx DM and htn.  Reports taking his insulin and diabetes meds today.  A&Ox4.  Pt denies confusion, dizziness, N/V/D. Vitals- 190/120, 110 bpm, 98%

## 2016-10-16 NOTE — Discharge Instructions (Signed)
Please continue to take your medications and insulin as prescribed. Schedule an appointment as soon as possible to see Urology. Keep your urinary catheter in place until that time. Please schedule an appointment to see your primary care doctors with family medicine to manage your chronic medical conditions as well.

## 2016-10-18 NOTE — Telephone Encounter (Signed)
Please let patient know he needs to make an appointment to be seen in the next month

## 2016-10-18 NOTE — Telephone Encounter (Signed)
Patient needs to follow up with Dr. Valentina Lucks or myself. Will refill Novolog for now

## 2016-10-20 LAB — URINE CULTURE: Culture: >=100000 — AB

## 2016-10-21 ENCOUNTER — Telehealth (HOSPITAL_BASED_OUTPATIENT_CLINIC_OR_DEPARTMENT_OTHER): Payer: Self-pay | Admitting: Emergency Medicine

## 2016-10-21 NOTE — Telephone Encounter (Signed)
Post ED Visit - Positive Culture Follow-up  Culture report reviewed by antimicrobial stewardship pharmacist:  []  Elenor Quinones, Pharm.D. []  Heide Guile, Pharm.D., BCPS []  Parks Neptune, Pharm.D. []  Alycia Rossetti, Pharm.D., BCPS []  [place], Pharm.D., BCPS, AAHIVP []  Legrand Como, Pharm.D., BCPS, AAHIVP []  Milus Glazier, Pharm.D. []  Stephens November, Pharm.D. Gwenlyn Perking PharmD  Positive urine culture Treated with none, asymptomatic, organism sensitive to the same and no further patient follow-up is required at this time.  Hazle Nordmann 10/21/2016, 9:20 AM

## 2016-10-21 NOTE — Telephone Encounter (Signed)
Spoke with patient, appointment scheduled with MD for 10/27.

## 2016-10-25 ENCOUNTER — Ambulatory Visit (INDEPENDENT_AMBULATORY_CARE_PROVIDER_SITE_OTHER): Payer: PPO | Admitting: Internal Medicine

## 2016-10-25 ENCOUNTER — Encounter: Payer: Self-pay | Admitting: Internal Medicine

## 2016-10-25 VITALS — BP 163/58 | HR 52 | Temp 98.4°F | Ht 72.0 in | Wt 340.0 lb

## 2016-10-25 DIAGNOSIS — A46 Erysipelas: Secondary | ICD-10-CM | POA: Diagnosis not present

## 2016-10-25 DIAGNOSIS — Z23 Encounter for immunization: Secondary | ICD-10-CM | POA: Diagnosis not present

## 2016-10-25 DIAGNOSIS — I10 Essential (primary) hypertension: Secondary | ICD-10-CM

## 2016-10-25 DIAGNOSIS — E1165 Type 2 diabetes mellitus with hyperglycemia: Secondary | ICD-10-CM

## 2016-10-25 DIAGNOSIS — IMO0002 Reserved for concepts with insufficient information to code with codable children: Secondary | ICD-10-CM

## 2016-10-25 DIAGNOSIS — E1140 Type 2 diabetes mellitus with diabetic neuropathy, unspecified: Secondary | ICD-10-CM | POA: Diagnosis not present

## 2016-10-25 DIAGNOSIS — I890 Lymphedema, not elsewhere classified: Secondary | ICD-10-CM

## 2016-10-25 LAB — POCT GLYCOSYLATED HEMOGLOBIN (HGB A1C): Hemoglobin A1C: 11.8

## 2016-10-25 NOTE — Progress Notes (Signed)
   [name] Kerrin Mo, MD Phone: [phone]  Reason For Visit:  F/U Diabetes and HTN   DIABETES Issues: None Disease Monitoring: Blood Sugar ranges- Checks blood sugars once or twice a day. Fasting CBG 150-160.  Max CBG 180  Polyuria/phagia/dipsia- none      Visual problems- blurry vision at times. several years, not been able to get over there.  Medications: 56 unit of levemir BID and Novolog 36 units BID Compliance- Misses about 2-3 times a week. Patient gets busy with other things.  Hypoglycemic symptoms- none   CHRONIC HTN: Reports, no issues  Current Meds - Metropolol, Maxide  Reports good compliance,took meds today. Tolerating well, w/o complaints. Lifestyle - Patient with significant issues getting around and morbidly obese.  Denies CP, dyspnea, HA, dizziness / lightheadedness Hx of  Edema - legs with elephantiasis   Urinary  Incontinence  - currently on flomax  - Seeing urology on October 31, 2016  - Has a urine catheter in place    Elephantiasis  - Patient has not seen Dr. Sharol Given recently  - However was previously seen by him  - Patient uses ace wraps  - Denies any ulceration of legs currently   Past Medical History Reviewed problem list.  Medications- reviewed and updated No additions to family history Social history- patient is a non- smoker  Objective: BP (!) 163/58   Pulse (!) 52   Temp 98.4 F (36.9 C) (Oral)   Ht 6' (1.829 m)   Wt (!) 340 lb (154.2 kg)   SpO2 99%   BMI 46.11 kg/m  Gen: NAD, alert, cooperative with exam - small cockroach noted on the wheelchair  Cardio: regular rate and rhythm, S1S2 heard, no murmurs appreciated Pulm: clear to auscultation bilaterally, no wheezes, rhonchi or rales Extremities: large lower extremities with 3+ pitting edema, legs with bark like changes to skin, with skin cracking, no signs of acute infection or opened ulcerations.   Assessment/Plan: See problem based  a/p  Elephantiasis nostras Follow up with Dr. Sharol Given - has not seen him in the last 6 months  Essential hypertension Not well controlled today, however will watch for now. BMET recently.  - Continue Maxide and Metoprolol  - Follow up in 1 month    DM type 2, uncontrolled, with neuropathy (HCC) A1C 11.8. Uncontrolled. Patient not currently on regiment discussed at last visit with Dr. Valentina Lucks in May 2017. Patient currently non-compliant. Not monitoring CBGs.  - Concern patient not compliant in part due his inability to get around, referral [facility] to help with medications. Offered home visits with Dr. McDiramid however patient not interested in this.   - Levemir 50 twice daily  and 45 units of Novolog twice daily  - Check your blood sugars in the morning and evening - Follow up two weeks

## 2016-10-25 NOTE — Patient Instructions (Addendum)
Your A1C 11.8 today. This is high!  - I want you to start taking Levemir 50 twice daily  and 45 units of Novolog twice daily  - I want you to try to check your blood sugars in the morning and evening  Please follow up with urology this is very important   I am going to contact home health people to help with management    I want you to follow up with me in 2 two weeks and we will reassess your blood sugars at that point

## 2016-10-29 NOTE — Assessment & Plan Note (Addendum)
Not well controlled today, however will watch for now. BMET recently.  - Continue Maxide and Metoprolol  - Follow up in 1 month

## 2016-10-29 NOTE — Assessment & Plan Note (Addendum)
A1C 11.8. Uncontrolled. Patient not currently on regiment discussed at last visit with Dr. Valentina Lucks in May 2017. Patient currently non-compliant. Not monitoring CBGs.  - Concern patient not compliant in part due his inability to get around, referral [facility] to help with medications. Offered home visits with Dr. McDiramid however patient not interested in this.   - Levemir 50 twice daily  and 45 units of Novolog twice daily  - Check your blood sugars in the morning and evening - Follow up two weeks

## 2016-10-29 NOTE — Assessment & Plan Note (Signed)
Follow up with Dr. Sharol Given - has not seen him in the last 6 months

## 2016-10-31 ENCOUNTER — Other Ambulatory Visit: Payer: Self-pay | Admitting: Urology

## 2016-10-31 DIAGNOSIS — R338 Other retention of urine: Secondary | ICD-10-CM | POA: Diagnosis not present

## 2016-10-31 DIAGNOSIS — C775 Secondary and unspecified malignant neoplasm of intrapelvic lymph nodes: Secondary | ICD-10-CM | POA: Diagnosis not present

## 2016-10-31 DIAGNOSIS — C61 Malignant neoplasm of prostate: Secondary | ICD-10-CM

## 2016-10-31 DIAGNOSIS — E291 Testicular hypofunction: Secondary | ICD-10-CM | POA: Diagnosis not present

## 2016-11-07 ENCOUNTER — Other Ambulatory Visit: Payer: Self-pay

## 2016-11-07 NOTE — Patient Outreach (Signed)
[facility]) Care Management  11/07/2016  VANNIE CHAPLA 1941/06/02 [place]:[id number]   Telephonic Screening   Referral Date:  10/28/2016 Source:  [facility]  Issue:  DM, COPD, PNA.  Needs help with medications  Outreach call #1 to patient.   Patient not reached and unable to leave a voice message.  RN CM scheduled for next outreach call within one week.   Nathaneil Canary, BSN, RN, [place] Management Care Management Coordinator [phone] Direct [phone] Cell [phone] Office [phone] Fax [email or web address]

## 2016-11-08 ENCOUNTER — Other Ambulatory Visit (INDEPENDENT_AMBULATORY_CARE_PROVIDER_SITE_OTHER): Payer: Self-pay | Admitting: Orthopedic Surgery

## 2016-11-08 ENCOUNTER — Other Ambulatory Visit: Payer: Self-pay

## 2016-11-08 ENCOUNTER — Encounter: Payer: Self-pay | Admitting: *Deleted

## 2016-11-08 NOTE — Patient Outreach (Signed)
[facility]) Care Management  11/08/2016  ZION TA [birth year] [id number]   Telephonic Screening   Referral Date:  10/28/2016 Source:  [facility]  Issue:  DM, COPD, PNA.  Needs help with medications Insurance:  HTA  Subjective: Outreach call #1 to patient.  Patient reached and Screening completed.  States urology testing scheduled at [facility] next week.   Providers: Primary MD:  Dr. Kerrin Mo -  last appt: 10/31/16 / next appt: 3 weeks  Urologist: Dr. Jeffie Pollock  - next appt:  11/13/16 testing and appt to follow that  Orthopedics: Dr. Meridee Score -    HH: None   Psycho/Social:  Patient lives in widower.   Needs help with house care. Mobility:  Uses crutches to transfer,  Falls: 1 crutch to broke.  Pain:  Yes but managed with pain medicine.  Leg  Depression: no Transportation:  SCAT Caregiver: Stepdaughter but limited time.  [place].  Cheri Rous.  Emergency Contact: Advance Directive: none but interested.   Consent:  Yes  Resources: Meals on Wheels:  frozen DME:   handicap apartment, crutches, BSC, W/C electric x 10 years. CBG meter, reading glasses, NO scale, NO BP Cuff  Co-morbidities:  DM, COPD, PNA.  Admissions: 0 ER visits: 1  10/16/16 Self checks daily 150-200 A1C 11.8 on 10/25/16  Medications:  Patient taking less than 15 medications  Co-pay cost issues: none  Flu Vaccine: 10/31/2016 Medication Reconciliation completed with patient 11/08/2016 [facility]:  Medications are  Delivered.    Encounter Medications:  Outpatient Encounter Prescriptions as of 11/08/2016  Medication Sig Note  . aspirin 81 MG chewable tablet Chew 81 mg by mouth every other day.    . Blood Glucose Monitoring Suppl (ONE TOUCH ULTRA SYSTEM KIT) W/DEVICE KIT 1 kit by Does not apply route once.   Marland Kitchen EASY TOUCH PEN NEEDLES 31G X 8 MM MISC USE WITH FLEXPEN AS DIRECTED   . esomeprazole (NEXIUM) 40 MG capsule TAKE 1 CAPSULE BY MOUTH EVERY  DAY   . FLOVENT HFA 220 MCG/ACT inhaler INHALE 1 PUFF INTO THE LUNGS TWICE DAILY   . fluticasone (CUTIVATE) 0.05 % cream Apply 1 application topically 2 (two) times daily. Reported on 05/23/2016   . furosemide (LASIX) 40 MG tablet TAKE 1 TABLET BY MOUTH TWICE DAILY   . glucose blood test strip Use as instructed   . Insulin Detemir (LEVEMIR FLEXTOUCH) 100 UNIT/ML Pen Inject 50 Units into the skin 2 (two) times daily. (Patient taking differently: Inject 60 Units into the skin 2 (two) times daily. )   . Lancet Devices (ONE TOUCH DELICA LANCING DEV) MISC 75 Units by Does not apply route 2 (two) times daily.   Marland Kitchen latanoprost (XALATAN) 0.005 % ophthalmic solution INSTILL 1 DROP IN BOTH EYES EVERY EVENING FOR GLAUCOMA   . metoprolol (LOPRESSOR) 100 MG tablet TAKE 1 TABLET BY MOUTH TWICE DAILY.   . mometasone (NASONEX) 50 MCG/ACT nasal spray PLACE 2 SPRAYS INTO THE NOSE DAILY   . mupirocin ointment (BACTROBAN) 2 % Apply 1 application topically 2 (two) times daily. To legs   . NOVOLOG FLEXPEN 100 UNIT/ML FlexPen INJECT 45 UNITS SUBCUTANEOUSLY TWICE DAILY   . ONE TOUCH ULTRA TEST test strip USE TO CHECK BLOOD SUGAR FOUR TIMES A DAY   . ONETOUCH DELICA LANCETS 11H MISC USE TO CHECK BLOOD SUGAR TWICE DAILY   . pravastatin (PRAVACHOL) 40 MG tablet TAKE 1 TABLET BY MOUTH DAILY   . PROAIR HFA 108 (90  Base) MCG/ACT inhaler INHALE 2 PUFFS BY MOUTH EVERY 4 HOURS AS NEEDED FOR WHEEZING OR SHORTNESS OF BREATH.   . silver sulfADIAZINE (SILVADENE) 1 % cream APPLY AS DIRECTED TO WOUND EVERY DAY   . tamsulosin (FLOMAX) 0.4 MG CAPS capsule TAKE 2 CAPSULES BY MOUTH EVERY MORNING.   . traMADol (ULTRAM) 50 MG tablet TAKE 1 TABLET BY MOUTH TWICE DAILY AS NEEDED FOR PAIN   . triamcinolone ointment (KENALOG) 0.1 % Apply 1 application topically 2 (two) times daily as needed (to legs).  05/23/2016: Received from: External Pharmacy Received Sig:   . triamterene-hydrochlorothiazide (MAXZIDE-25) 37.5-25 MG tablet TAKE 1 TABLET BY  MOUTH EVERY DAY FOR BLOOD PRESSURE    No facility-administered encounter medications on file as of 11/08/2016.     Functional Status:  In your present state of health, do you have any difficulty performing the following activities: 11/08/2016  Hearing? N  Vision? Y  Difficulty concentrating or making decisions? N  Walking or climbing stairs? Y  Dressing or bathing? Y  Doing errands, shopping? Y  Preparing Food and eating ? N  Using the Toilet? N  In the past six months, have you accidently leaked urine? N  Do you have problems with loss of bowel control? N  Managing your Medications? Y  Managing your Finances? N  Housekeeping or managing your Housekeeping? Y  Some recent data might be hidden    Fall/Depression Screening: PHQ 2/9 Scores 11/08/2016 11/08/2016 10/25/2016 04/11/2016 03/29/2016 02/01/2015 11/11/2014  PHQ - 2 Score 0 0 0 0 0 0 0    Fall Risk  11/08/2016 10/25/2016 04/11/2016 03/29/2016 11/11/2014  Falls in the past year? Yes No No No No  Number falls in past yr: 1 - - - -  Injury with Fall? No - - - -  Risk for fall due to : History of fall(s);Impaired balance/gait;Impaired mobility;Impaired vision;Medication side effect - - - Impaired mobility  Risk for fall due to (comments): - - - - -  Follow up Falls evaluation completed;Education provided;Falls prevention discussed - - - -    Preventives: Hearing: no issues and screened more than 2 years ago.  Eyes:  Opthalmologist:  (name unknown) - None in more than a year.  Reading glasses; needs 350-375 and unable to find any glasses that strong.  RN CM advised patient eye appt needed and may need to go to a Rx eyeglass to get higher reading strength; Diabetic eye exam needed.   Dentist: no problems; has all teeth but 2.  Last appt over 3-4 years ago. Podiatrist:  None but MD has advised patient he needs to see a foot doctor.   Assessment / Plan:  Referral Date:  10/28/2016  [facility] Community RN CM Referral  - A1C elevated:   11.8 10/25/16  Results for HITOSHI, WERTS (MRN [id number]) as of 11/08/2016 10:19  Ref. Range 05/09/2014 13:41 11/11/2014 13:50 06/30/2015 09:09 03/29/2016 10:58 10/25/2016 13:40  Hemoglobin A1C Unknown 7.6 8.6 9.2 11.6 11.8  Patient states able to afford medications but has difficulty self managing medications.    Eye Appt needed:  RN CM encouraged Diabetic Eye Exam and to also get prescription glasses to manage reading vision strength needs of 300-350 which patient is unable to locate in the stores.  Advised this may cause patient to read medications wrong and take wrong medication and is difficult to read insulin dosages.    [facility] Social Work Referral -Development worker, community.   H/o widower confined to electric W/C using crutch  for transfer.  Lives alone.  Uses SCAT Transportation.  Meals on Wheels active.  Patient needs help with house care.  Please assess for possible resources to assist with this need.  -Advance Directives:  Patient interested in completion.   -Eye glasses needed.    RN CM advised in next [facility] scheduled contact call within next 30 days for monthly assessment and care coordination services as needed. RN CM advised in next scheduled contact call within the next 2 -3 weeks / next 30 days. RN CM advised patient in next [facility] scheduled contact call within the next 10 business days.  2 weeks / 1 month  RN CM advised to please notify MD of any changes in condition prior to scheduled appt's.   RN CM provided contact name and # [phone] or main office # [phone] and 24-hour nurse line # [phone].  RN CM confirmed patient is aware of 911 services for urgent emergency needs.  Nathaneil Canary, BSN, RN, [place] Management Care Management Coordinator [phone] Direct [phone] Cell [phone] Office [phone] Fax [email or web address]

## 2016-11-11 ENCOUNTER — Other Ambulatory Visit: Payer: Self-pay | Admitting: *Deleted

## 2016-11-11 ENCOUNTER — Other Ambulatory Visit (INDEPENDENT_AMBULATORY_CARE_PROVIDER_SITE_OTHER): Payer: Self-pay | Admitting: Radiology

## 2016-11-11 MED ORDER — TRAMADOL HCL 50 MG PO TABS: 50.0000 mg | ORAL_TABLET | Freq: Two times a day (BID) | ORAL | 0 refills | Status: DC

## 2016-11-11 NOTE — Telephone Encounter (Signed)
Called rx into pharmacy physicians pharmacy saved in patients chart

## 2016-11-11 NOTE — Patient Outreach (Signed)
[facility]) Care Management  11/11/2016  [name] 03-14-1941 [id number]     CSW received a new referral on patient from Mariann Laster, Telephonic Nurse Case Manager with [place] Management, indicating that patient would benefit from social work services and resources to assist with referrals to various agencies.  Mrs. Hutchinson reported that patient currently lives alone, after recent death of wife.  Mrs. Hutchinson went on to say that patient is wheelchair bound, using an electric scooter for mobility, a crutch for transfers from wheelchair to bed and SCAT Paramedic) for transport to and from physician appointments.  Patient is active with [employer] through [employer] [place].  Patient reports being interested in obtaining housekeeping services in the home, realizing that this service will private pay.  Patient is also interested in completing his Advanced Directives ([place] documents).  [name] would like for CSW to assist patient with obtaining an eye appointment with a Diabetic specialist, as patient will require prescription glasses to manage reading due to patient's strength being between 300-350.  Patient would also benefit from a referral to a community RNCM to assist with medication management as well as disease management. CSW made an initial attempt to try and contact patient today to perform phone assessment, as well as assess and assist with social needs and services, without success.  A HIPAA complaint message was left for patient on voicemail.  CSW is currently awaiting a return call.  CSW will make a second outreach attempt within the next week, if CSW does not receive a return call from patient in the meantime. Nat Christen, BSW, MSW, LCSW  Licensed Education officer, environmental Health System   Mailing [place] [address], [place], [place] [address] Physical Address-[address], [place], [place] [address] Toll Free Main # [phone] Fax # [phone] Cell # [phone]  Fax # [phone]  [email or web address]

## 2016-11-13 ENCOUNTER — Encounter (HOSPITAL_COMMUNITY): Payer: Self-pay

## 2016-11-13 ENCOUNTER — Encounter (HOSPITAL_COMMUNITY)
Admission: RE | Admit: 2016-11-13 | Discharge: 2016-11-13 | Disposition: A | Discharge status: Home / Self Care | Payer: PPO | Source: Ambulatory Visit | Attending: Urology | Admitting: Urology

## 2016-11-13 ENCOUNTER — Other Ambulatory Visit: Payer: Self-pay | Admitting: Internal Medicine

## 2016-11-13 ENCOUNTER — Other Ambulatory Visit (INDEPENDENT_AMBULATORY_CARE_PROVIDER_SITE_OTHER): Payer: Self-pay | Admitting: Orthopedic Surgery

## 2016-11-13 ENCOUNTER — Ambulatory Visit (HOSPITAL_COMMUNITY)
Admission: RE | Admit: 2016-11-13 | Discharge: 2016-11-13 | Disposition: A | Discharge status: Home / Self Care | Payer: PPO | Source: Ambulatory Visit | Attending: Urology | Admitting: Urology

## 2016-11-13 DIAGNOSIS — C61 Malignant neoplasm of prostate: Secondary | ICD-10-CM | POA: Diagnosis not present

## 2016-11-13 DIAGNOSIS — I700 Atherosclerosis of aorta: Secondary | ICD-10-CM | POA: Insufficient documentation

## 2016-11-13 DIAGNOSIS — M889 Osteitis deformans of unspecified bone: Secondary | ICD-10-CM | POA: Diagnosis not present

## 2016-11-13 DIAGNOSIS — R590 Localized enlarged lymph nodes: Secondary | ICD-10-CM | POA: Diagnosis not present

## 2016-11-13 MED ORDER — IOPAMIDOL (ISOVUE-300) INJECTION 61%
150.0000 mL | Freq: Once | INTRAVENOUS | Status: AC | PRN
  Administered 2016-11-13: 125 mL via INTRAVENOUS

## 2016-11-13 MED ORDER — TECHNETIUM TC 99M MEDRONATE IV KIT
25.0000 | PACK | Freq: Once | INTRAVENOUS | Status: AC | PRN
  Administered 2016-11-13: 25 via INTRAVENOUS

## 2016-11-15 ENCOUNTER — Other Ambulatory Visit: Payer: Self-pay | Admitting: *Deleted

## 2016-11-15 ENCOUNTER — Encounter: Payer: Self-pay | Admitting: *Deleted

## 2016-11-15 NOTE — Patient Outreach (Signed)
[facility]) Care Management  11/15/2016  [name] 27-Sep-1941 [id number]   Referral received from telephonic care manager to assess needs for further [facility] involvement with community care manager.  According to chart, member has history of hypertension, COPD, diabetes (last A1C = 11.8), prostate cancer, obesity, and osteomyelitis.    Call placed to member, identity verified.  This care manager introduces self and purpose of call.  [facility] care management services again explained.  He report that one of his concerns is the need for assistance in the home, particularly for "help with my legs."  He state that he has had wounds on his legs in the past that are now healed and he was told to keep cream on them, keeping them moisturized.  He state he is unable to do that for himself.  He is made aware that a Education officer, museum will contact him regarding personal care assistance.  Member informed that this care manager is aware of his need for another pair of reading glasses and will follow up how to obtain them.  He verbalizes understanding.  He agrees to home visit.  Will complete initial home visit/assessment within the next 2 weeks.  Contact information provided, advised to contact with any concerns in the meantime.  [name], [place], MSN [place] [phone]

## 2016-11-18 ENCOUNTER — Other Ambulatory Visit: Payer: Self-pay | Admitting: *Deleted

## 2016-11-18 NOTE — Patient Outreach (Signed)
[facility]) Care Management  11/18/2016  IZAYIAH OSBORNE 18-Mar-1941 [id number]   CSW made a second attempt to try and contact patient today to perform phone assessment, as well as assess and assist with social work needs and services, without success.  A HIPAA compliant message was left on voicemail.  CSW will make a third and final outreach attempt in one week, if CSW does not receive a return call from patient in the meantime. CSW submitted a request to Josepha Pigg, Care Management Assistant with [place] Management to mail the following packet of resource information to patient: [facility] Nutritional therapist for Treatment Advanced Directives (Living Will and [employer] Power of Attorney documents) Programmer, systems for [place] List of [place], [place], MSW, LCSW  Licensed Clinical Social Worker  [place]  Mailing [place]. [address] [place], [place] [address] Physical Address-[address], [place], [place] [address] Toll Free Main # [phone] Fax # [phone] Cell # [phone]  Fax # [phone]  [email or web address]

## 2016-11-19 DIAGNOSIS — C775 Secondary and unspecified malignant neoplasm of intrapelvic lymph nodes: Secondary | ICD-10-CM | POA: Diagnosis not present

## 2016-11-19 DIAGNOSIS — E291 Testicular hypofunction: Secondary | ICD-10-CM | POA: Diagnosis not present

## 2016-11-19 DIAGNOSIS — C61 Malignant neoplasm of prostate: Secondary | ICD-10-CM | POA: Diagnosis not present

## 2016-11-19 DIAGNOSIS — N3941 Urge incontinence: Secondary | ICD-10-CM | POA: Diagnosis not present

## 2016-11-22 NOTE — Patient Outreach (Signed)
[facility]) Care Management  11/22/2016  COLTAN FRANCHETTI 07-09-1941 [id number]  Request received from Nat Christen, LCSW to mail patient community resources. Resources mailed today, 11/22/16  Josepha Pigg, [place] Management Assistant

## 2016-11-25 ENCOUNTER — Encounter: Payer: Self-pay | Admitting: *Deleted

## 2016-11-25 ENCOUNTER — Other Ambulatory Visit: Payer: Self-pay | Admitting: *Deleted

## 2016-11-25 NOTE — Patient Outreach (Signed)
[name]) Care Huff  11/25/2016  [name] 08-06-1941 [id number]   CSW was able to make initial contact with patient today to perform phone assessment, as well as assess and assist with social work needs and services.  CSW introduced self, explained role and types of services provided through [name] ([name]).  CSW further explained to patient that CSW works with patient's RNCM, also with [name], [name]. CSW then explained the reason for the call, indicating that patient's Telephonic RNCM, [name], also with [name], thought that patient would benefit from social work services and resources to assist with referrals to various community agencies and resources.  CSW obtained two HIPAA compliant identifiers from patient, which included patient's name and date of birth. Patient was able to confirm that he currently lives alone, after the recent death of his wife.  Patient further admitted that he is wheelchair bound, using an electric scooter for mobility and a crutch for transfers from his wheelchair to the bed.  Patient indicated that the battery for his scooter went dead and that he needs to obtain a new battery from [employer].  CSW agreed to contact patient's Primary Care Physician, Dr. Bertram Huff office to request that an order be faxed to [facility], on behalf of patient, to replace the battery for his power chair.  Patient currently uses SCAT Paramedic) for transport to and from all his physician appointments.  Patient is active with [employer] through [employer] of [place] and currently receives 5 hot meals per week.   Patient indicated that he is interested in obtaining housekeeping services in the home, realizing that this service will be private pay, as insurance does not cover these types of in-home  services.  Patient is also interested in completing his Advanced Directives ([place] documents).  CSW agreed to mail patient a packet of resource information which will include a complete listing of all private agency sitters, as well as an Garment/textile technologist.  Last, Mrs. [name] would like for CSW to assist patient with obtaining an eye appointment with a Diabetic specialist, as patient will require prescription glasses to manage reading due to patient's strength being between 300-350.  Mrs. [name] has been assigned to patient and will assist with coordinating patient's health care and prescriptions needs.   CSW has submitted a request to [name], Care Huff Assistant with [place] Huff, to mail a list of community resources to patient, including a [employer], alone with a Consent for Treatment.  CSW has agreed to follow-up with patient within the next week and a half, to ensure that patient received the packet of information, and to answer any questions that patient may have at that time.  Patient has been given CSW's contact information and was encouraged to contact CSW directly if he needs assistance in the meantime.  Patient voiced understanding and was agreeable to this plan. [name], BSW, MSW, LCSW  Licensed Education officer, environmental Health System  Mailing [place] [address], [place], [place] [address] Physical Address-[address], [place], [place] [address] Toll Free Main # [phone] Fax # [phone] Cell # [phone]  Fax # [phone]  [name]

## 2016-11-26 ENCOUNTER — Telehealth: Payer: Self-pay | Admitting: Internal Medicine

## 2016-11-26 NOTE — Telephone Encounter (Signed)
Pt needs batteries for power chair, please fax order to [facility]. [phone]. Please advise. Thanks! ep

## 2016-11-27 ENCOUNTER — Other Ambulatory Visit: Payer: Self-pay | Admitting: *Deleted

## 2016-11-27 NOTE — Patient Outreach (Signed)
[name]) Care Management  11/27/2016  [name] July 01, 1941 [id number]   Arrived at [facility] home at scheduled time for initial home visit.  [facility] care management services explained again as services were explained during initial telephone outreach.  He confirms contact with LCSW, J. Saporito, made aware that per note package will be sent to member.  He expresses that his main concern is to have housekeeping and personal care assistance in the home.  He is made aware that since he does not qualify for Medicaid that this will be an out of expense.  He state that when [facility] was introduced, he was under the impression that these services would be provided free of charge.  This care manager explained that [facility] is a care management/coordination program, providing an avenue for requested services.  He state "if I had the money to pay for it I would've been gotten it done."  He state that he does save his money from time to time to hire some assistance, but he is in need of more assistance that what he has had in the past.  He again state that he does not have the finances to pay out of pocket, but is open to speak with the LCSW again for follow up once information is received.  Discussed primary MD's concerns according to referral, medication management and diabetes management.  He is informed that his A1C is elevated at 11.8.  This care manager offered assistance with diabetes and medication management, member state that he does not need the help, stating that he is able to manage his own medications and feel he is managing his diabetes well independently.  MD's concerns again discussed, diabetes management again offered.  Also offered pharmacy assistance with medication management.  He again denies the need.  He states that the only reason he agreed to services initially was to have housekeeping and personal care assistance provided.  Encouraged to consider services, provided with contact  information for this care manager and [facility] welcome packet.  Advised to contact if he changes his mind about involvement.    Will notify LCSW and primary MD of denial for services.  Will not open case to nursing at this time.  [name], [place], MSN [place] [phone]

## 2016-11-29 NOTE — Telephone Encounter (Signed)
Sent in prescription via fax

## 2016-12-02 ENCOUNTER — Other Ambulatory Visit: Payer: Self-pay | Admitting: *Deleted

## 2016-12-03 ENCOUNTER — Encounter: Payer: Self-pay | Admitting: *Deleted

## 2016-12-03 NOTE — Patient Outreach (Signed)
[facility]) Care Management  12/03/2016  NECHEMIA CHIAPPETTA June 23, 1941 [id number]   CSW was able to make contact with patient today to follow-up regarding social work services and resources, as well as to ensure that patient received the packet of resource information that CSW mailed to patient's home.  Patient reported that he currently has "everything he needs" and admitted that no additional social work services are indicated at this time.  Patient further reported that he is no longer in need of any [facility] Orthoptist services. CSW will perform a case closure on patient, as all goals of treatment have been met from social work standpoint and no additional social work needs have been identified at this time.  CSW will notify patient's RNCM with [place] Management, [name] of CSW's plans to close patient's case.  CSW will fax an update to patient's Primary Care Physician, Dr.  Kerrin Mo to ensure that they are aware of CSW's involvement with patient's plan of care.  CSW will submit a case closure request to Josepha Pigg, Care Management Assistant with [place] Management, in the form of an [employer].   Nat Christen, BSW, MSW, LCSW  Licensed Education officer, environmental Health System  Mailing [place] [address] [place], [place] [address] Physical Address-[address], [place], [place] [address] Toll Free Main # [phone] Fax # [phone] Cell # [phone]  Fax # [phone]  [email or web address]

## 2016-12-09 ENCOUNTER — Other Ambulatory Visit (INDEPENDENT_AMBULATORY_CARE_PROVIDER_SITE_OTHER): Payer: Self-pay | Admitting: Orthopedic Surgery

## 2016-12-11 ENCOUNTER — Other Ambulatory Visit: Payer: Self-pay | Admitting: Internal Medicine

## 2016-12-11 ENCOUNTER — Other Ambulatory Visit (INDEPENDENT_AMBULATORY_CARE_PROVIDER_SITE_OTHER): Payer: Self-pay | Admitting: Orthopedic Surgery

## 2016-12-12 NOTE — Telephone Encounter (Signed)
Already responded via fax

## 2016-12-17 DIAGNOSIS — N3941 Urge incontinence: Secondary | ICD-10-CM | POA: Diagnosis not present

## 2016-12-17 DIAGNOSIS — C61 Malignant neoplasm of prostate: Secondary | ICD-10-CM | POA: Diagnosis not present

## 2016-12-27 DIAGNOSIS — Z5111 Encounter for antineoplastic chemotherapy: Secondary | ICD-10-CM | POA: Diagnosis not present

## 2016-12-27 DIAGNOSIS — C61 Malignant neoplasm of prostate: Secondary | ICD-10-CM | POA: Diagnosis not present

## 2017-01-02 ENCOUNTER — Other Ambulatory Visit (INDEPENDENT_AMBULATORY_CARE_PROVIDER_SITE_OTHER): Payer: Self-pay | Admitting: Orthopedic Surgery

## 2017-01-08 ENCOUNTER — Other Ambulatory Visit (INDEPENDENT_AMBULATORY_CARE_PROVIDER_SITE_OTHER): Payer: Self-pay | Admitting: Orthopedic Surgery

## 2017-01-08 ENCOUNTER — Other Ambulatory Visit: Payer: Self-pay | Admitting: Internal Medicine

## 2017-01-20 ENCOUNTER — Telehealth: Payer: Self-pay | Admitting: *Deleted

## 2017-01-20 NOTE — Telephone Encounter (Signed)
Pt calling in needing a prescription for tires for his wheelchair, send it to [facility]. Fax number: [phone]. Rayburn Mundis Kennon Holter, CMA

## 2017-01-28 NOTE — Telephone Encounter (Signed)
Pt is calling to check the status of his request for tires for his wheelchair. Can we give the patient an update. jw

## 2017-01-28 NOTE — Telephone Encounter (Signed)
Faxed over the prescription

## 2017-02-05 ENCOUNTER — Other Ambulatory Visit: Payer: Self-pay | Admitting: Family Medicine

## 2017-02-05 ENCOUNTER — Other Ambulatory Visit (INDEPENDENT_AMBULATORY_CARE_PROVIDER_SITE_OTHER): Payer: Self-pay | Admitting: Orthopedic Surgery

## 2017-02-05 ENCOUNTER — Other Ambulatory Visit: Payer: Self-pay | Admitting: Internal Medicine

## 2017-02-06 ENCOUNTER — Other Ambulatory Visit (INDEPENDENT_AMBULATORY_CARE_PROVIDER_SITE_OTHER): Payer: Self-pay | Admitting: Orthopedic Surgery

## 2017-02-06 MED ORDER — TRAMADOL HCL 50 MG PO TABS: 50.0000 mg | ORAL_TABLET | Freq: Two times a day (BID) | ORAL | 0 refills | Status: DC | PRN

## 2017-02-06 NOTE — Telephone Encounter (Signed)
Prescription written

## 2017-02-27 DIAGNOSIS — K56609 Unspecified intestinal obstruction, unspecified as to partial versus complete obstruction: Secondary | ICD-10-CM

## 2017-02-27 HISTORY — DX: Unspecified intestinal obstruction, unspecified as to partial versus complete obstruction: K56.609

## 2017-03-05 DIAGNOSIS — Z7409 Other reduced mobility: Secondary | ICD-10-CM | POA: Diagnosis not present

## 2017-03-05 DIAGNOSIS — M86669 Other chronic osteomyelitis, unspecified tibia and fibula: Secondary | ICD-10-CM | POA: Diagnosis not present

## 2017-03-07 ENCOUNTER — Other Ambulatory Visit: Payer: Self-pay | Admitting: Family Medicine

## 2017-03-07 ENCOUNTER — Other Ambulatory Visit (INDEPENDENT_AMBULATORY_CARE_PROVIDER_SITE_OTHER): Payer: Self-pay | Admitting: Orthopedic Surgery

## 2017-03-07 ENCOUNTER — Other Ambulatory Visit: Payer: Self-pay | Admitting: Internal Medicine

## 2017-03-07 DIAGNOSIS — IMO0002 Reserved for concepts with insufficient information to code with codable children: Secondary | ICD-10-CM

## 2017-03-07 DIAGNOSIS — E1165 Type 2 diabetes mellitus with hyperglycemia: Principal | ICD-10-CM

## 2017-03-07 DIAGNOSIS — E1140 Type 2 diabetes mellitus with diabetic neuropathy, unspecified: Secondary | ICD-10-CM

## 2017-03-14 ENCOUNTER — Encounter (HOSPITAL_COMMUNITY): Payer: Self-pay | Admitting: Radiology

## 2017-03-14 ENCOUNTER — Inpatient Hospital Stay (HOSPITAL_COMMUNITY)
Admission: EM | Admit: 2017-03-14 | Discharge: 2017-03-19 | DRG: 389 | Disposition: A | Discharge status: Home Health | Payer: PPO | Attending: Family Medicine | Admitting: Family Medicine

## 2017-03-14 ENCOUNTER — Emergency Department (HOSPITAL_COMMUNITY): Payer: PPO

## 2017-03-14 DIAGNOSIS — Z7951 Long term (current) use of inhaled steroids: Secondary | ICD-10-CM

## 2017-03-14 DIAGNOSIS — R197 Diarrhea, unspecified: Secondary | ICD-10-CM | POA: Diagnosis not present

## 2017-03-14 DIAGNOSIS — D649 Anemia, unspecified: Secondary | ICD-10-CM | POA: Diagnosis present

## 2017-03-14 DIAGNOSIS — R1110 Vomiting, unspecified: Secondary | ICD-10-CM | POA: Diagnosis present

## 2017-03-14 DIAGNOSIS — E1142 Type 2 diabetes mellitus with diabetic polyneuropathy: Secondary | ICD-10-CM | POA: Diagnosis not present

## 2017-03-14 DIAGNOSIS — Z4659 Encounter for fitting and adjustment of other gastrointestinal appliance and device: Secondary | ICD-10-CM

## 2017-03-14 DIAGNOSIS — E871 Hypo-osmolality and hyponatremia: Secondary | ICD-10-CM | POA: Diagnosis present

## 2017-03-14 DIAGNOSIS — I872 Venous insufficiency (chronic) (peripheral): Secondary | ICD-10-CM | POA: Diagnosis present

## 2017-03-14 DIAGNOSIS — Z888 Allergy status to other drugs, medicaments and biological substances status: Secondary | ICD-10-CM

## 2017-03-14 DIAGNOSIS — K567 Ileus, unspecified: Secondary | ICD-10-CM | POA: Diagnosis not present

## 2017-03-14 DIAGNOSIS — Z431 Encounter for attention to gastrostomy: Secondary | ICD-10-CM | POA: Diagnosis not present

## 2017-03-14 DIAGNOSIS — I10 Essential (primary) hypertension: Secondary | ICD-10-CM | POA: Diagnosis present

## 2017-03-14 DIAGNOSIS — R1030 Lower abdominal pain, unspecified: Secondary | ICD-10-CM | POA: Diagnosis not present

## 2017-03-14 DIAGNOSIS — Z96643 Presence of artificial hip joint, bilateral: Secondary | ICD-10-CM | POA: Diagnosis not present

## 2017-03-14 DIAGNOSIS — Z86010 Personal history of colonic polyps: Secondary | ICD-10-CM | POA: Diagnosis not present

## 2017-03-14 DIAGNOSIS — M889 Osteitis deformans of unspecified bone: Secondary | ICD-10-CM | POA: Diagnosis not present

## 2017-03-14 DIAGNOSIS — M86661 Other chronic osteomyelitis, right tibia and fibula: Secondary | ICD-10-CM | POA: Diagnosis present

## 2017-03-14 DIAGNOSIS — I890 Lymphedema, not elsewhere classified: Secondary | ICD-10-CM | POA: Diagnosis present

## 2017-03-14 DIAGNOSIS — Z7982 Long term (current) use of aspirin: Secondary | ICD-10-CM

## 2017-03-14 DIAGNOSIS — E119 Type 2 diabetes mellitus without complications: Secondary | ICD-10-CM

## 2017-03-14 DIAGNOSIS — K2970 Gastritis, unspecified, without bleeding: Secondary | ICD-10-CM | POA: Diagnosis not present

## 2017-03-14 DIAGNOSIS — E1165 Type 2 diabetes mellitus with hyperglycemia: Secondary | ICD-10-CM | POA: Diagnosis not present

## 2017-03-14 DIAGNOSIS — R600 Localized edema: Secondary | ICD-10-CM | POA: Diagnosis present

## 2017-03-14 DIAGNOSIS — N179 Acute kidney failure, unspecified: Secondary | ICD-10-CM | POA: Diagnosis not present

## 2017-03-14 DIAGNOSIS — N390 Urinary tract infection, site not specified: Secondary | ICD-10-CM | POA: Diagnosis not present

## 2017-03-14 DIAGNOSIS — E785 Hyperlipidemia, unspecified: Secondary | ICD-10-CM | POA: Diagnosis present

## 2017-03-14 DIAGNOSIS — K56699 Other intestinal obstruction unspecified as to partial versus complete obstruction: Secondary | ICD-10-CM | POA: Diagnosis not present

## 2017-03-14 DIAGNOSIS — R300 Dysuria: Secondary | ICD-10-CM | POA: Diagnosis not present

## 2017-03-14 DIAGNOSIS — Z8546 Personal history of malignant neoplasm of prostate: Secondary | ICD-10-CM

## 2017-03-14 DIAGNOSIS — Z8249 Family history of ischemic heart disease and other diseases of the circulatory system: Secondary | ICD-10-CM

## 2017-03-14 DIAGNOSIS — Z794 Long term (current) use of insulin: Secondary | ICD-10-CM

## 2017-03-14 DIAGNOSIS — K219 Gastro-esophageal reflux disease without esophagitis: Secondary | ICD-10-CM | POA: Diagnosis present

## 2017-03-14 DIAGNOSIS — J449 Chronic obstructive pulmonary disease, unspecified: Secondary | ICD-10-CM | POA: Diagnosis not present

## 2017-03-14 DIAGNOSIS — K56609 Unspecified intestinal obstruction, unspecified as to partial versus complete obstruction: Secondary | ICD-10-CM | POA: Diagnosis present

## 2017-03-14 DIAGNOSIS — E118 Type 2 diabetes mellitus with unspecified complications: Secondary | ICD-10-CM | POA: Diagnosis not present

## 2017-03-14 DIAGNOSIS — R112 Nausea with vomiting, unspecified: Secondary | ICD-10-CM | POA: Diagnosis not present

## 2017-03-14 HISTORY — DX: Unspecified intestinal obstruction, unspecified as to partial versus complete obstruction: K56.609

## 2017-03-14 LAB — COMPREHENSIVE METABOLIC PANEL
ALK PHOS: 160 U/L — AB (ref 38–126)
ALT: 13 U/L — AB (ref 17–63)
AST: 13 U/L — ABNORMAL LOW (ref 15–41)
Albumin: 2.8 g/dL — ABNORMAL LOW (ref 3.5–5.0)
Anion gap: 12 (ref 5–15)
BUN: 28 mg/dL — ABNORMAL HIGH (ref 6–20)
CALCIUM: 9 mg/dL (ref 8.9–10.3)
CO2: 27 mmol/L (ref 22–32)
CREATININE: 1.33 mg/dL — AB (ref 0.61–1.24)
Chloride: 91 mmol/L — ABNORMAL LOW (ref 101–111)
GFR, EST AFRICAN AMERICAN: 58 mL/min — AB (ref >60)
GFR, EST NON AFRICAN AMERICAN: 50 mL/min — AB (ref >60)
Glucose, Bld: 493 mg/dL — ABNORMAL HIGH (ref 65–99)
Potassium: 4.6 mmol/L (ref 3.5–5.1)
Sodium: 130 mmol/L — ABNORMAL LOW (ref 135–145)
Total Bilirubin: 0.1 mg/dL — ABNORMAL LOW (ref 0.3–1.2)
Total Protein: 8 g/dL (ref 6.5–8.1)

## 2017-03-14 LAB — LIPASE, BLOOD: LIPASE: 10 U/L — AB (ref 11–51)

## 2017-03-14 LAB — CBC WITH DIFFERENTIAL/PLATELET
Basophils Absolute: 0 10*3/uL (ref 0.0–0.1)
Basophils Relative: 0 %
EOS PCT: 1 %
Eosinophils Absolute: 0.1 10*3/uL (ref 0.0–0.7)
HEMATOCRIT: 36.5 % — AB (ref 39.0–52.0)
HEMOGLOBIN: 11.7 g/dL — AB (ref 13.0–17.0)
LYMPHS ABS: 1.1 10*3/uL (ref 0.7–4.0)
LYMPHS PCT: 8 %
MCH: 26.7 pg (ref 26.0–34.0)
MCHC: 32.1 g/dL (ref 30.0–36.0)
MCV: 83.1 fL (ref 78.0–100.0)
Monocytes Absolute: 0.7 10*3/uL (ref 0.1–1.0)
Monocytes Relative: 6 %
NEUTROS ABS: 10.9 10*3/uL — AB (ref 1.7–7.7)
NEUTROS PCT: 85 %
Platelets: 274 10*3/uL (ref 150–400)
RBC: 4.39 MIL/uL (ref 4.22–5.81)
RDW: 14.3 % (ref 11.5–15.5)
WBC: 12.8 10*3/uL — AB (ref 4.0–10.5)

## 2017-03-14 LAB — URINALYSIS, ROUTINE W REFLEX MICROSCOPIC
BILIRUBIN URINE: NEGATIVE
KETONES UR: NEGATIVE mg/dL
Nitrite: NEGATIVE
PH: 7 (ref 5.0–8.0)
PROTEIN: NEGATIVE mg/dL
Specific Gravity, Urine: 1.022 (ref 1.005–1.030)

## 2017-03-14 LAB — CBG MONITORING, ED: Glucose-Capillary: 452 mg/dL — ABNORMAL HIGH (ref 65–99)

## 2017-03-14 MED ORDER — FENTANYL CITRATE (PF) 100 MCG/2ML IJ SOLN
100.0000 ug | Freq: Once | INTRAMUSCULAR | Status: AC
  Administered 2017-03-15: 100 ug via INTRAVENOUS
  Filled 2017-03-14: qty 2

## 2017-03-14 MED ORDER — MORPHINE SULFATE (PF) 4 MG/ML IV SOLN
6.0000 mg | Freq: Once | INTRAVENOUS | Status: AC
  Administered 2017-03-14: 6 mg via INTRAVENOUS
  Filled 2017-03-14: qty 2

## 2017-03-14 MED ORDER — SODIUM CHLORIDE 0.9 % IV BOLUS (SEPSIS)
1000.0000 mL | Freq: Once | INTRAVENOUS | Status: AC
  Administered 2017-03-14: 1000 mL via INTRAVENOUS

## 2017-03-14 MED ORDER — IOPAMIDOL (ISOVUE-300) INJECTION 61%
INTRAVENOUS | Status: AC
  Administered 2017-03-14: 100 mL
  Filled 2017-03-14: qty 100

## 2017-03-14 NOTE — ED Notes (Signed)
Pt alert no distress 

## 2017-03-14 NOTE — ED Triage Notes (Addendum)
Pt to Ed via [facility] with c/o lower abd pain and diarrhea onset approx 1 hour ago.  Pt st's has had 1 diarrhea stool.  No nausea or vomiting.  Pt has bil pedal edema which is normal for him.  EMS reports pt's CBG is 553, pt st's he had a snack before coming to ED

## 2017-03-14 NOTE — ED Notes (Signed)
Pt returned from xray

## 2017-03-14 NOTE — ED Notes (Signed)
As soon as the pt was given the morphine he immediately vomited approx 700 ml of undigested food

## 2017-03-14 NOTE — ED Notes (Signed)
Pt to CT at this time.

## 2017-03-14 NOTE — ED Provider Notes (Signed)
[facility] DEPT Provider Note   CSN: [id number] Arrival date & time: 03/14/17  1929     History   Chief Complaint Chief Complaint  Patient presents with  . Abdominal Pain  . Diarrhea    HPI SAID RUEB is a 76 y.o. male.   Abdominal Pain   Associated symptoms include diarrhea.  Diarrhea   Associated symptoms include abdominal pain.   76 year old male past medical history of hypertension, prostate cancer, venous insufficiency, reflux, diabetes presents the emergency department with severe acute onset of lower abdominal pain associated diarrhea and vomiting. Never any that this before. He tried to sit on the toilet for a while to see if he would get better and it did not see came here for further evaluation. He has been having pain for approximately 3 hours. No urinary symptoms aside from having a history of obstruction. No fevers. No recent illnesses. No other symptoms that are new.  Past Medical History:  Diagnosis Date  . COLON POLYP 02/26/2007   Qualifier: Diagnosis of  By: Erling Cruz  MD, MELISSA    . Complication of anesthesia   . Diabetes mellitus   . GERD (gastroesophageal reflux disease)   . Heart murmur    last 2D Echo -03/31/08  . HIATAL HERNIA WITH REFLUX 09/29/2006   Qualifier: Diagnosis of  By: Erling Cruz  MD, MELISSA    . HIP REPLACEMENT, BILATERAL, HX OF 05/12/2007   Qualifier: Diagnosis of  By: Erling Cruz  MD, MELISSA    . History of blood transfusion   . Hypertension   . OSTEOARTHRITIS 03/05/2007   Qualifier: Diagnosis of  By: Erling Cruz  MD, MELISSA    . PONV (postoperative nausea and vomiting)   . Prostate cancer ([place])   . Seasonal allergies   . VENOUS INSUFFICIENCY, CHRONIC 02/26/2007   Qualifier: Diagnosis of  By: Erling Cruz  MD, MELISSA      Patient Active Problem List   Diagnosis Date Noted  . SBO (small bowel obstruction) 03/15/2017  . Vomiting 03/15/2017  . Elephantiasis nostras 03/29/2016  . Impaired mobility 09/09/2014  . Diabetic peripheral neuropathy associated  with type 2 diabetes mellitus ([place]) 02/07/2014  . Ulcer of left lower leg ([place]) 10/05/2012  . Anemia 05/21/2012  . Chronic leg pain 11/17/2011  . COPD, severity to be determined ([place]) 05/14/2011  . Chronic osteomyelitis of lower leg ([place]) 11/20/2010  . HLD (hyperlipidemia) 12/01/2008  . ADENOCARCINOMA, PROSTATE 05/24/2008  . DM type 2, uncontrolled, with neuropathy ([place]) 01/20/2008  . Morbid obesity with body mass index of 50.0-59.9 in adult ([place]) 03/05/2007  . Essential hypertension 03/05/2007    Past Surgical History:  Procedure Laterality Date  . COLONOSCOPY    . COLONOSCOPY    . HIP ARTHROPLASTY     bil  . I&D EXTREMITY  08/14/2012   Procedure: IRRIGATION AND DEBRIDEMENT EXTREMITY;  Surgeon: Newt Minion, MD;  Location: [facility];  Service: Orthopedics;  Laterality: Right;  Irrigation and Debridement Right tibia, Placement antibiotic beads  . I&D EXTREMITY Left 03/18/2014   Procedure: IRRIGATION AND DEBRIDEMENT EXTREMITY;  Surgeon: Newt Minion, MD;  Location: [facility];  Service: Orthopedics;  Laterality: Left;  Debridement Left Calf Ulcer, Apply Theraskin and Wound VAC  . IRRIGATION AND DEBRIDEMENT ABSCESS Left 03/18/2014   DR DUDA  . LEG SURGERY     ROD for fracture  . LEG WOUND REPAIR / CLOSURE     Beads .  Skin wound  . SKIN GRAFT     right leg-  from donor skin  . SKIN GRAFT  1976   R wrist   skin graft from left thigh  . UPPER GASTROINTESTINAL ENDOSCOPY    . WISDOM TOOTH EXTRACTION    . WRIST FUSION     right wrist       Home Medications    Prior to Admission medications   Medication Sig Start Date End Date Taking? Authorizing Provider  aspirin 81 MG chewable tablet Chew 81 mg by mouth every other day.    Yes Historical Provider, MD  Blood Glucose Monitoring Suppl (ONE TOUCH ULTRA SYSTEM KIT) W/DEVICE KIT 1 kit by Does not apply route once. 11/04/13  Yes Angelica Ran, MD  EASY TOUCH PEN NEEDLES 31G X 8 MM MISC USE WITH FLEXPEN AS DIRECTED 02/06/17  Yes Asiyah  Cletis Media, MD  esomeprazole (NEXIUM) 40 MG capsule TAKE 1 CAPSULE BY MOUTH EVERY DAY 03/07/17  Yes Asiyah Cletis Media, MD  FLOVENT HFA 220 MCG/ACT inhaler INHALE 1 PUFF INTO THE LUNGS TWICE DAILY 11/14/16  Yes Asiyah Cletis Media, MD  fluticasone (CUTIVATE) 0.05 % cream Apply 1 application topically 2 (two) times daily. Reported on 05/23/2016   Yes Historical Provider, MD  furosemide (LASIX) 40 MG tablet TAKE 1 TABLET BY MOUTH TWICE DAILY. 12/12/16  Yes Asiyah Cletis Media, MD  Insulin Detemir (LEVEMIR FLEXTOUCH) 100 UNIT/ML Pen Inject 50 Units into the skin 2 (two) times daily. Patient taking differently: Inject 60 Units into the skin 2 (two) times daily.  05/23/16  Yes Zenia Resides, MD  Lancet Devices (ONE TOUCH DELICA LANCING DEV) MISC 75 Units by Does not apply route 2 (two) times daily. 04/11/16  Yes Rosemarie Ax, MD  latanoprost (XALATAN) 0.005 % ophthalmic solution INSTILL 1 DROP IN BOTH EYES EVERY EVENING FOR GLAUCOMA 12/07/15  Yes Rosemarie Ax, MD  metoprolol (LOPRESSOR) 100 MG tablet TAKE 1 TABLET BY MOUTH TWICE DAILY 02/06/17  Yes Asiyah Cletis Media, MD  mometasone (NASONEX) 50 MCG/ACT nasal spray PLACE 2 SPRAYS INTO THE NOSE DAILY 12/12/16  Yes Asiyah Cletis Media, MD  mupirocin ointment (BACTROBAN) 2 % Apply 1 application topically 2 (two) times daily. To legs   Yes Historical Provider, MD  NOVOLOG FLEXPEN 100 UNIT/ML FlexPen INJECT 45 UNITS SUBCUTANEOUSLY TWICE DAILY 03/07/17  Yes Asiyah Cletis Media, MD  ONE TOUCH ULTRA TEST test strip USE TO CHECK BLOOD SUGAR FOUR TIMES A DAY 09/22/15  Yes Rosemarie Ax, MD  ONE TOUCH ULTRA TEST test strip USE AS DIRECTED 03/07/17  Yes [place], MD  [facility] DELICA LANCETS 06C MISC USE TO CHECK BLOOD SUGAR TWICE DAILY 08/22/16  Yes Asiyah Cletis Media, MD  pravastatin (PRAVACHOL) 40 MG tablet TAKE 1 TABLET BY MOUTH DAILY 01/09/17  Yes Asiyah Cletis Media, MD  PROAIR HFA 108 (90 Base) MCG/ACT inhaler INHALE 2 PUFFS BY MOUTH EVERY 4  HOURS AS NEEDED FOR WHEEZING OR SHORTNESS OF BREATH 12/12/16  Yes Asiyah Cletis Media, MD  silver sulfADIAZINE (SILVADENE) 1 % cream APPLY AS DIRECTED TO WOUND EVERY DAY. 03/07/17  Yes Asiyah Cletis Media, MD  tamsulosin (FLOMAX) 0.4 MG CAPS capsule TAKE 2 CAPSULES BY MOUTH EVERY MORNING 02/06/17  Yes Asiyah Cletis Media, MD  traMADol (ULTRAM) 50 MG tablet TAKE 1 TABLET BY MOUTH TWICE DAILY AS NEEDED 03/07/17  Yes Newt Minion, MD  triamcinolone ointment (KENALOG) 0.1 % APPLY TO AFFECTED AREA TWICE DAILY AS NEEDED 03/07/17  Yes Newt Minion, MD  triamterene-hydrochlorothiazide (MAXZIDE-25) 37.5-25 MG tablet TAKE  1 TABLET BY MOUTH EVERY DAY FOR BLOOD PRESSURE. 12/12/16  Yes Asiyah Cletis Media, MD  latanoprost (XALATAN) 0.005 % ophthalmic solution INSTILL 1 DROP IN BOTH EYES EVERY EVENING FOR GLAUCOMA. Patient not taking: Reported on 03/14/2017 01/09/17   Asiyah Cletis Media, MD    Family History Family History  Problem Relation Age of Onset  . Hypertension Mother     Social History Social History  Substance Use Topics  . Smoking status: Never Smoker  . Smokeless tobacco: Never Used  . Alcohol use No     Allergies   Ace inhibitors; Angiotensin receptor blockers; and Metformin   Review of Systems Review of Systems  Gastrointestinal: Positive for abdominal pain and diarrhea.  All other systems reviewed and are negative.    Physical Exam Updated Vital Signs BP (!) 154/89   Pulse (!) 105   Temp 98.2 F (36.8 C) (Oral)   Resp 18   SpO2 97%   Physical Exam  Constitutional: He is oriented to person, place, and time. He appears well-developed and well-nourished.  HENT:  Head: Normocephalic and atraumatic.  Eyes: Conjunctivae and EOM are normal.  Neck: Normal range of motion.  Cardiovascular: Normal rate.   Pulmonary/Chest: Effort normal. No respiratory distress.  Abdominal: Soft. Bowel sounds are normal. He exhibits no distension and no mass. There is tenderness (bilateral lower  quadrants). There is no guarding.  Musculoskeletal: Normal range of motion.  Neurological: He is alert and oriented to person, place, and time. No cranial nerve deficit. Coordination normal.  Skin: Skin is warm and dry.  Nursing note and vitals reviewed.    ED Treatments / Results  Labs (all labs ordered are listed, but only abnormal results are displayed) Labs Reviewed  CBC WITH DIFFERENTIAL/PLATELET - Abnormal; Notable for the following:       Result Value   WBC 12.8 (*)    Hemoglobin 11.7 (*)    HCT 36.5 (*)    Neutro Abs 10.9 (*)    All other components within normal limits  COMPREHENSIVE METABOLIC PANEL - Abnormal; Notable for the following:    Sodium 130 (*)    Chloride 91 (*)    Glucose, Bld 493 (*)    BUN 28 (*)    Creatinine, Ser 1.33 (*)    Albumin 2.8 (*)    AST 13 (*)    ALT 13 (*)    Alkaline Phosphatase 160 (*)    Total Bilirubin 0.1 (*)    GFR calc non Af Amer 50 (*)    GFR calc Af Amer 58 (*)    All other components within normal limits  LIPASE, BLOOD - Abnormal; Notable for the following:    Lipase 10 (*)    All other components within normal limits  URINALYSIS, ROUTINE W REFLEX MICROSCOPIC - Abnormal; Notable for the following:    APPearance HAZY (*)    Glucose, UA >=500 (*)    Hgb urine dipstick MODERATE (*)    Leukocytes, UA LARGE (*)    Bacteria, UA RARE (*)    Squamous Epithelial / LPF 0-5 (*)    All other components within normal limits  CBG MONITORING, ED - Abnormal; Notable for the following:    Glucose-Capillary 452 (*)    All other components within normal limits  URINE CULTURE    EKG  EKG Interpretation  Date/Time:  Friday March 14 2017 19:32:28 EDT Ventricular Rate:  86 PR Interval:    QRS Duration: 92 QT Interval:  399 QTC  Calculation: 478 R Axis:   22 Text Interpretation:  Sinus rhythm Prolonged PR interval Probable left atrial enlargement Probable anterolateral infarct, old Confirmed by [facility] MD, Corene Cornea [phone]) on 03/14/2017  7:36:30 PM       Radiology Ct Abdomen Pelvis W Contrast  Result Date: 03/14/2017 CLINICAL DATA:  Severe umbilical pain radiating to the back with nausea and vomiting EXAM: CT ABDOMEN AND PELVIS WITH CONTRAST TECHNIQUE: Multidetector CT imaging of the abdomen and pelvis was performed using the standard protocol following bolus administration of intravenous contrast. CONTRAST:  100 mL Isovue-300 intravenous COMPARISON:  Bone scan 11/13/2016, CT 11/13/2016, 11/11/2012 FINDINGS: Lower chest: Lung bases demonstrate no acute consolidation or effusion. Heart size within normal limits. Coronary artery calcifications. Hepatobiliary: No focal hepatic abnormality. Multiple calcified gallstones. No biliary dilatation. Pancreas: Unremarkable. No pancreatic ductal dilatation or surrounding inflammatory changes. Spleen: Normal in size without focal abnormality. Adrenals/Urinary Tract: Adrenal glands are within normal limits. Kidneys show no hydronephrosis. Thick-walled bladder but partially obscured by artifact from surgical hardware are in the hips. Stomach/Bowel: Multiple loops of fluid-filled borderline enlarged small bowel within the abdomen and pelvis with distal decompressed small bowel loops. No colon wall thickening. Prominent appendix tip but contains gas within its lumen and no evidence for inflammation. Scattered sigmoid colon diverticula without acute inflammation. Vascular/Lymphatic: Atherosclerosis of the aorta. Stable 0 right iliac lymph nodes measuring up to 1.5 cm on the right an 1.2 cm on the left. Reproductive: Obscured by artifact from pelvic hardware. No obvious masses. Other: No free air or free fluid.  Fat containing umbilical hernia. Musculoskeletal: Status post bilateral hip replacements with associated artifact. Changes of left hemipelvis Paget's disease as before. Diffuse sclerosis of the right iliac bone and SI joint which may be posttraumatic, also unchanged. IMPRESSION: 1. Negative appendix.  2. Borderline fluid-filled enlarged loops of small bowel within the abdomen and pelvis. Findings could be secondary to an ileus or developing/partial small bowel obstruction. 3. Thick-walled urinary bladder. 4. Stable osseous structures with apparent left hemipelvis Paget's disease. Electronically Signed   By: Donavan Foil M.D.   On: 03/14/2017 23:02    Procedures Procedures (including critical care time)  Medications Ordered in ED Medications  dextrose 5 %-0.45 % sodium chloride infusion ( Intravenous New Bag/Given 03/15/17 0016)  morphine 4 MG/ML injection 6 mg (6 mg Intravenous Given 03/14/17 2134)  sodium chloride 0.9 % bolus 1,000 mL (0 mLs Intravenous Stopped 03/14/17 2330)  iopamidol (ISOVUE-300) 61 % injection (100 mLs  Contrast Given 03/14/17 2228)  fentaNYL (SUBLIMAZE) injection 100 mcg (100 mcg Intravenous Given 03/15/17 0016)  ondansetron (ZOFRAN) injection 4 mg (4 mg Intravenous Given 03/15/17 0004)     Initial Impression / Assessment and Plan / ED Course  I have reviewed the triage vital signs and the nursing notes.  Pertinent labs & imaging results that were available during my care of the patient were reviewed by me and considered in my medical decision making (see chart for details).    Concern for possible mesenteric ischemia vs Gi illness. Difficult to obtain good exam 2/2 weight but seems to have tenderness without rebound/guarding. Plan for labs/ct/pain meds. Significant nausea and vomiting here per nursing.  Ct w/ concern for sbo. Will put in NG and admit.    Final Clinical Impressions(s) / ED Diagnoses   Final diagnoses:  None      Merrily Pew, MD 03/15/17 [phone]

## 2017-03-15 ENCOUNTER — Observation Stay (HOSPITAL_COMMUNITY): Payer: PPO

## 2017-03-15 ENCOUNTER — Encounter (HOSPITAL_COMMUNITY): Payer: Self-pay | Admitting: Emergency Medicine

## 2017-03-15 DIAGNOSIS — I890 Lymphedema, not elsewhere classified: Secondary | ICD-10-CM | POA: Diagnosis not present

## 2017-03-15 DIAGNOSIS — K5649 Other impaction of intestine: Secondary | ICD-10-CM | POA: Diagnosis not present

## 2017-03-15 DIAGNOSIS — N179 Acute kidney failure, unspecified: Secondary | ICD-10-CM

## 2017-03-15 DIAGNOSIS — Z794 Long term (current) use of insulin: Secondary | ICD-10-CM

## 2017-03-15 DIAGNOSIS — K219 Gastro-esophageal reflux disease without esophagitis: Secondary | ICD-10-CM | POA: Diagnosis not present

## 2017-03-15 DIAGNOSIS — E118 Type 2 diabetes mellitus with unspecified complications: Secondary | ICD-10-CM | POA: Diagnosis not present

## 2017-03-15 DIAGNOSIS — R1030 Lower abdominal pain, unspecified: Secondary | ICD-10-CM | POA: Diagnosis not present

## 2017-03-15 DIAGNOSIS — Z4682 Encounter for fitting and adjustment of non-vascular catheter: Secondary | ICD-10-CM | POA: Diagnosis not present

## 2017-03-15 DIAGNOSIS — R112 Nausea with vomiting, unspecified: Secondary | ICD-10-CM | POA: Diagnosis not present

## 2017-03-15 DIAGNOSIS — M889 Osteitis deformans of unspecified bone: Secondary | ICD-10-CM | POA: Diagnosis not present

## 2017-03-15 DIAGNOSIS — I10 Essential (primary) hypertension: Secondary | ICD-10-CM | POA: Diagnosis not present

## 2017-03-15 DIAGNOSIS — K56609 Unspecified intestinal obstruction, unspecified as to partial versus complete obstruction: Secondary | ICD-10-CM | POA: Diagnosis present

## 2017-03-15 DIAGNOSIS — E1165 Type 2 diabetes mellitus with hyperglycemia: Secondary | ICD-10-CM | POA: Diagnosis not present

## 2017-03-15 DIAGNOSIS — R140 Abdominal distension (gaseous): Secondary | ICD-10-CM | POA: Diagnosis not present

## 2017-03-15 DIAGNOSIS — E1142 Type 2 diabetes mellitus with diabetic polyneuropathy: Secondary | ICD-10-CM | POA: Diagnosis not present

## 2017-03-15 DIAGNOSIS — K567 Ileus, unspecified: Secondary | ICD-10-CM | POA: Diagnosis not present

## 2017-03-15 DIAGNOSIS — M86661 Other chronic osteomyelitis, right tibia and fibula: Secondary | ICD-10-CM | POA: Diagnosis not present

## 2017-03-15 DIAGNOSIS — R600 Localized edema: Secondary | ICD-10-CM | POA: Diagnosis not present

## 2017-03-15 DIAGNOSIS — Z8546 Personal history of malignant neoplasm of prostate: Secondary | ICD-10-CM | POA: Diagnosis not present

## 2017-03-15 DIAGNOSIS — Z7982 Long term (current) use of aspirin: Secondary | ICD-10-CM | POA: Diagnosis not present

## 2017-03-15 DIAGNOSIS — E785 Hyperlipidemia, unspecified: Secondary | ICD-10-CM | POA: Diagnosis not present

## 2017-03-15 DIAGNOSIS — Z7951 Long term (current) use of inhaled steroids: Secondary | ICD-10-CM | POA: Diagnosis not present

## 2017-03-15 DIAGNOSIS — Z8249 Family history of ischemic heart disease and other diseases of the circulatory system: Secondary | ICD-10-CM | POA: Diagnosis not present

## 2017-03-15 DIAGNOSIS — E871 Hypo-osmolality and hyponatremia: Secondary | ICD-10-CM | POA: Diagnosis not present

## 2017-03-15 DIAGNOSIS — R1110 Vomiting, unspecified: Secondary | ICD-10-CM | POA: Diagnosis present

## 2017-03-15 DIAGNOSIS — N390 Urinary tract infection, site not specified: Secondary | ICD-10-CM | POA: Diagnosis not present

## 2017-03-15 DIAGNOSIS — Z96643 Presence of artificial hip joint, bilateral: Secondary | ICD-10-CM | POA: Diagnosis not present

## 2017-03-15 DIAGNOSIS — Z86010 Personal history of colonic polyps: Secondary | ICD-10-CM | POA: Diagnosis not present

## 2017-03-15 DIAGNOSIS — J80 Acute respiratory distress syndrome: Secondary | ICD-10-CM | POA: Diagnosis not present

## 2017-03-15 DIAGNOSIS — E119 Type 2 diabetes mellitus without complications: Secondary | ICD-10-CM

## 2017-03-15 DIAGNOSIS — R1084 Generalized abdominal pain: Secondary | ICD-10-CM | POA: Diagnosis not present

## 2017-03-15 DIAGNOSIS — R110 Nausea: Secondary | ICD-10-CM | POA: Diagnosis not present

## 2017-03-15 DIAGNOSIS — R300 Dysuria: Secondary | ICD-10-CM | POA: Diagnosis not present

## 2017-03-15 DIAGNOSIS — J449 Chronic obstructive pulmonary disease, unspecified: Secondary | ICD-10-CM | POA: Diagnosis not present

## 2017-03-15 DIAGNOSIS — I872 Venous insufficiency (chronic) (peripheral): Secondary | ICD-10-CM | POA: Diagnosis not present

## 2017-03-15 DIAGNOSIS — K56699 Other intestinal obstruction unspecified as to partial versus complete obstruction: Secondary | ICD-10-CM | POA: Diagnosis not present

## 2017-03-15 LAB — GLUCOSE, CAPILLARY
GLUCOSE-CAPILLARY: 87 mg/dL (ref 65–99)
GLUCOSE-CAPILLARY: 117 mg/dL — AB (ref 65–99)
Glucose-Capillary: 365 mg/dL — ABNORMAL HIGH (ref 65–99)
Glucose-Capillary: 232 mg/dL — ABNORMAL HIGH (ref 65–99)
Glucose-Capillary: 204 mg/dL — ABNORMAL HIGH (ref 65–99)

## 2017-03-15 LAB — BASIC METABOLIC PANEL
Anion gap: 9 (ref 5–15)
BUN: 20 mg/dL (ref 6–20)
CALCIUM: 8.6 mg/dL — AB (ref 8.9–10.3)
CO2: 27 mmol/L (ref 22–32)
CREATININE: 1.02 mg/dL (ref 0.61–1.24)
Chloride: 96 mmol/L — ABNORMAL LOW (ref 101–111)
GFR calc Af Amer: >60 mL/min (ref >60)
GFR calc non Af Amer: >60 mL/min (ref >60)
Glucose, Bld: 331 mg/dL — ABNORMAL HIGH (ref 65–99)
POTASSIUM: 3.8 mmol/L (ref 3.5–5.1)
SODIUM: 132 mmol/L — AB (ref 135–145)

## 2017-03-15 LAB — LACTIC ACID, PLASMA: Lactic Acid, Venous: 1.7 mmol/L (ref 0.5–1.9)

## 2017-03-15 LAB — CBG MONITORING, ED: GLUCOSE-CAPILLARY: 403 mg/dL — AB (ref 65–99)

## 2017-03-15 MED ORDER — MORPHINE SULFATE (PF) 4 MG/ML IV SOLN: 4.0000 mg | INTRAVENOUS | Status: DC | PRN

## 2017-03-15 MED ORDER — LATANOPROST 0.005 % OP SOLN
1.0000 [drp] | Freq: Every day | OPHTHALMIC | Status: DC
  Administered 2017-03-15 – 2017-03-18 (×5): 1 [drp] via OPHTHALMIC
  Filled 2017-03-15: qty 2.5

## 2017-03-15 MED ORDER — FLUTICASONE PROPIONATE 50 MCG/ACT NA SUSP
2.0000 | Freq: Every day | NASAL | Status: DC
Start: 2017-03-15 — End: 2017-03-19
  Administered 2017-03-16 – 2017-03-19 (×4): 2 via NASAL
  Filled 2017-03-15: qty 16

## 2017-03-15 MED ORDER — DEXTROSE-NACL 5-0.45 % IV SOLN: INTRAVENOUS | Status: DC

## 2017-03-15 MED ORDER — ONDANSETRON HCL 4 MG/2ML IJ SOLN
4.0000 mg | Freq: Once | INTRAMUSCULAR | Status: AC
  Administered 2017-03-15: 4 mg via INTRAVENOUS

## 2017-03-15 MED ORDER — BUDESONIDE 0.5 MG/2ML IN SUSP
0.5000 mg | Freq: Two times a day (BID) | RESPIRATORY_TRACT | Status: DC
  Administered 2017-03-15 – 2017-03-19 (×9): 0.5 mg via RESPIRATORY_TRACT
  Filled 2017-03-15 (×10): qty 2

## 2017-03-15 MED ORDER — METOPROLOL TARTRATE 5 MG/5ML IV SOLN
5.0000 mg | Freq: Four times a day (QID) | INTRAVENOUS | Status: DC
  Administered 2017-03-15 – 2017-03-18 (×14): 5 mg via INTRAVENOUS
  Filled 2017-03-15 (×15): qty 5

## 2017-03-15 MED ORDER — DEXTROSE-NACL 5-0.45 % IV SOLN
INTRAVENOUS | Status: DC
  Administered 2017-03-15 – 2017-03-16 (×5): via INTRAVENOUS

## 2017-03-15 MED ORDER — SILVER SULFADIAZINE 1 % EX CREA
TOPICAL_CREAM | Freq: Every day | CUTANEOUS | Status: DC
  Administered 2017-03-15: 1 via TOPICAL
  Administered 2017-03-16 – 2017-03-19 (×4): via TOPICAL
  Filled 2017-03-15 (×3): qty 85

## 2017-03-15 MED ORDER — ONDANSETRON HCL 4 MG/2ML IJ SOLN
4.0000 mg | Freq: Four times a day (QID) | INTRAMUSCULAR | Status: DC | PRN
  Administered 2017-03-16 – 2017-03-17 (×2): 4 mg via INTRAVENOUS
  Filled 2017-03-15 (×2): qty 2

## 2017-03-15 MED ORDER — ALBUTEROL SULFATE (2.5 MG/3ML) 0.083% IN NEBU: 2.5000 mg | INHALATION_SOLUTION | Freq: Four times a day (QID) | RESPIRATORY_TRACT | Status: DC | PRN

## 2017-03-15 MED ORDER — ONDANSETRON HCL 4 MG PO TABS: 4.0000 mg | ORAL_TABLET | Freq: Four times a day (QID) | ORAL | Status: DC | PRN

## 2017-03-15 MED ORDER — TRIAMCINOLONE ACETONIDE 0.1 % EX OINT
TOPICAL_OINTMENT | Freq: Two times a day (BID) | CUTANEOUS | Status: DC
  Administered 2017-03-15: 23:00:00 via TOPICAL
  Administered 2017-03-15: 1 via TOPICAL
  Administered 2017-03-16 – 2017-03-17 (×4): via TOPICAL
  Administered 2017-03-18: 1 via TOPICAL
  Administered 2017-03-18 – 2017-03-19 (×2): via TOPICAL
  Filled 2017-03-15: qty 15

## 2017-03-15 MED ORDER — DEXTROSE-NACL 5-0.45 % IV SOLN
INTRAVENOUS | Status: DC
  Administered 2017-03-15: via INTRAVENOUS

## 2017-03-15 MED ORDER — HYDRALAZINE HCL 20 MG/ML IJ SOLN: 5.0000 mg | INTRAMUSCULAR | Status: DC | PRN

## 2017-03-15 MED ORDER — INSULIN ASPART 100 UNIT/ML ~~LOC~~ SOLN
0.0000 [IU] | SUBCUTANEOUS | Status: DC
  Administered 2017-03-15: 7 [IU] via SUBCUTANEOUS
  Administered 2017-03-15 (×2): 20 [IU] via SUBCUTANEOUS
  Administered 2017-03-15: 7 [IU] via SUBCUTANEOUS
  Administered 2017-03-16: 4 [IU] via SUBCUTANEOUS
  Administered 2017-03-16: 15 [IU] via SUBCUTANEOUS
  Administered 2017-03-16: 7 [IU] via SUBCUTANEOUS
  Administered 2017-03-16 – 2017-03-17 (×5): 4 [IU] via SUBCUTANEOUS
  Administered 2017-03-17 – 2017-03-18 (×3): 3 [IU] via SUBCUTANEOUS
  Administered 2017-03-18 (×2): 4 [IU] via SUBCUTANEOUS
  Administered 2017-03-18: 7 [IU] via SUBCUTANEOUS
  Administered 2017-03-18: 3 [IU] via SUBCUTANEOUS
  Administered 2017-03-19 (×2): 4 [IU] via SUBCUTANEOUS
  Administered 2017-03-19: 3 [IU] via SUBCUTANEOUS

## 2017-03-15 MED ORDER — ENOXAPARIN SODIUM 40 MG/0.4ML ~~LOC~~ SOLN: 40.0000 mg | Freq: Every day | SUBCUTANEOUS | Status: DC

## 2017-03-15 NOTE — Consult Note (Signed)
[facility] Surgery Consult Note  [name] 05-Jun-1941  [id number].    Requesting MD: Gwendlyn Deutscher, MD Chief Complaint/Reason for Consult: ileus vs pSBO  HPI:  [name] is a 76 year old male with multiple medical problems including, but not limited to, hypertension, hyperlipidemia, diabetes mellitus type 2, COPD, GERD, and Hx prostate adenocarcinoma, who presented to [facility] emergency department on the evening of 3/16 complaining of sharp, waxing and waning, centralized abdominal pain. Associated symptoms include nausea and vomiting. Denies history of small bowel obstruction. Patient reports that for the past one year he has a mixture of diarrhea and constipation. He typically has a bowel movement every other day without taking any laxatives or stool softeners. Patient has no history of abdominal surgeries or hernias. Last colonoscopy was 3 years ago and was negative for any abnormality. Has had colon polyps removed in the past that were benign. Denies a history of colon cancer or known family history of colon cancer. He takes Ultram 4-5 times weekly for leg pain. He takes a baby aspirin daily but denies use of other blood thinners. At baseline he mobilizes with a motor scooter.   CT abdomen and pelvis performed in the ED significant for dilated small bowel loops w/ some decompressed distal small bowel consistent with ileus versus early partial small bowel obstruction. Nasogastric tube was placed in the emergency department and the patient was admitted for further management. General surgery has been asked to consult. Currently 600 cc bilious output in NG cannister.  ROS: Review of Systems  Constitutional: Positive for weight loss (30 lbs in one month 2/2 decreased appetite and early satiety). Negative for chills and fever.  Respiratory: Negative.   Cardiovascular: Negative.   Gastrointestinal: Positive for abdominal pain, constipation, diarrhea, nausea and vomiting.  Genitourinary:  Positive for dysuria. Negative for hematuria.  Neurological: Negative for dizziness.  All other systems reviewed and are negative.   Family History  Problem Relation Age of Onset  . Hypertension Mother     Past Medical History:  Diagnosis Date  . COLON POLYP 02/26/2007   Qualifier: Diagnosis of  By: Erling Cruz  MD, MELISSA    . Complication of anesthesia   . Diabetes mellitus   . GERD (gastroesophageal reflux disease)   . Heart murmur    last 2D Echo -03/31/08  . HIATAL HERNIA WITH REFLUX 09/29/2006   Qualifier: Diagnosis of  By: Erling Cruz  MD, MELISSA    . HIP REPLACEMENT, BILATERAL, HX OF 05/12/2007   Qualifier: Diagnosis of  By: Erling Cruz  MD, MELISSA    . History of blood transfusion   . Hypertension   . OSTEOARTHRITIS 03/05/2007   Qualifier: Diagnosis of  By: Erling Cruz  MD, MELISSA    . PONV (postoperative nausea and vomiting)   . Prostate cancer ([place])   . Seasonal allergies   . VENOUS INSUFFICIENCY, CHRONIC 02/26/2007   Qualifier: Diagnosis of  By: Erling Cruz  MD, MELISSA      Past Surgical History:  Procedure Laterality Date  . COLONOSCOPY    . COLONOSCOPY    . HIP ARTHROPLASTY     bil  . I&D EXTREMITY  08/14/2012   Procedure: IRRIGATION AND DEBRIDEMENT EXTREMITY;  Surgeon: Newt Minion, MD;  Location: [place];  Service: Orthopedics;  Laterality: Right;  Irrigation and Debridement Right tibia, Placement antibiotic beads  . I&D EXTREMITY Left 03/18/2014   Procedure: IRRIGATION AND DEBRIDEMENT EXTREMITY;  Surgeon: Newt Minion, MD;  Location: [place];  Service: Orthopedics;  Laterality: Left;  Debridement Left Calf Ulcer, Apply Theraskin and Wound VAC  . IRRIGATION AND DEBRIDEMENT ABSCESS Left 03/18/2014   DR DUDA  . LEG SURGERY     ROD for fracture  . LEG WOUND REPAIR / CLOSURE     Beads .  Skin wound  . SKIN GRAFT     right leg- from donor skin  . SKIN GRAFT  1976   R wrist   skin graft from left thigh  . UPPER GASTROINTESTINAL ENDOSCOPY    . WISDOM TOOTH EXTRACTION    . WRIST FUSION      right wrist    Social History:  reports that he has never smoked. He has never used smokeless tobacco. He reports that he does not drink alcohol or use drugs.  Allergies:  Allergies  Allergen Reactions  . Ace Inhibitors Cough    With Lisinopril.  . Angiotensin Receptor Blockers Other (See Comments)    Caused excessive weight gain  . Metformin Diarrhea    Medications Prior to Admission  Medication Sig Dispense Refill  . aspirin 81 MG chewable tablet Chew 81 mg by mouth every other day.     . Blood Glucose Monitoring Suppl (ONE TOUCH ULTRA SYSTEM KIT) W/DEVICE KIT 1 kit by Does not apply route once. 1 each 0  . EASY TOUCH PEN NEEDLES 31G X 8 MM MISC USE WITH FLEXPEN AS DIRECTED 100 each 8  . esomeprazole (NEXIUM) 40 MG capsule TAKE 1 CAPSULE BY MOUTH EVERY DAY 60 capsule 0  . FLOVENT HFA 220 MCG/ACT inhaler INHALE 1 PUFF INTO THE LUNGS TWICE DAILY 3 Inhaler 2  . fluticasone (CUTIVATE) 0.05 % cream Apply 1 application topically 2 (two) times daily. Reported on 05/23/2016    . furosemide (LASIX) 40 MG tablet TAKE 1 TABLET BY MOUTH TWICE DAILY. 60 tablet 2  . Insulin Detemir (LEVEMIR FLEXTOUCH) 100 UNIT/ML Pen Inject 50 Units into the skin 2 (two) times daily. (Patient taking differently: Inject 60 Units into the skin 2 (two) times daily. ) 45 mL 10  . Lancet Devices (ONE TOUCH DELICA LANCING DEV) MISC 75 Units by Does not apply route 2 (two) times daily. 1 each 0  . latanoprost (XALATAN) 0.005 % ophthalmic solution INSTILL 1 DROP IN BOTH EYES EVERY EVENING FOR GLAUCOMA 2.5 mL 1  . metoprolol (LOPRESSOR) 100 MG tablet TAKE 1 TABLET BY MOUTH TWICE DAILY 60 tablet 0  . mometasone (NASONEX) 50 MCG/ACT nasal spray PLACE 2 SPRAYS INTO THE NOSE DAILY 17 g 3  . mupirocin ointment (BACTROBAN) 2 % Apply 1 application topically 2 (two) times daily. To legs    . NOVOLOG FLEXPEN 100 UNIT/ML FlexPen INJECT 45 UNITS SUBCUTANEOUSLY TWICE DAILY 27 pen 3  . ONE TOUCH ULTRA TEST test strip USE TO CHECK BLOOD  SUGAR FOUR TIMES A DAY 100 each 11  . ONE TOUCH ULTRA TEST test strip USE AS DIRECTED 100 each 11  . ONETOUCH DELICA LANCETS 83M MISC USE TO CHECK BLOOD SUGAR TWICE DAILY 200 each 10  . pravastatin (PRAVACHOL) 40 MG tablet TAKE 1 TABLET BY MOUTH DAILY 90 tablet 3  . PROAIR HFA 108 (90 Base) MCG/ACT inhaler INHALE 2 PUFFS BY MOUTH EVERY 4 HOURS AS NEEDED FOR WHEEZING OR SHORTNESS OF BREATH 8.5 each 2  . silver sulfADIAZINE (SILVADENE) 1 % cream APPLY AS DIRECTED TO WOUND EVERY DAY. 50 g 0  . tamsulosin (FLOMAX) 0.4 MG CAPS capsule TAKE 2 CAPSULES BY MOUTH EVERY MORNING 60 capsule 0  . traMADol (ULTRAM)  50 MG tablet TAKE 1 TABLET BY MOUTH TWICE DAILY AS NEEDED 60 tablet 0  . triamcinolone ointment (KENALOG) 0.1 % APPLY TO AFFECTED AREA TWICE DAILY AS NEEDED 80 g 0  . triamterene-hydrochlorothiazide (MAXZIDE-25) 37.5-25 MG tablet TAKE 1 TABLET BY MOUTH EVERY DAY FOR BLOOD PRESSURE. 30 tablet 2  . latanoprost (XALATAN) 0.005 % ophthalmic solution INSTILL 1 DROP IN BOTH EYES EVERY EVENING FOR GLAUCOMA. (Patient not taking: Reported on 03/14/2017) 2.5 mL 2    Blood pressure (!) 128/57, pulse 92, temperature 97.8 F (36.6 C), temperature source Oral, resp. rate 18, SpO2 96 %. Physical Exam: Physical Exam  Constitutional: He is oriented to person, place, and time and well-developed, well-nourished, and in no distress.  obese  HENT:  Head: Normocephalic and atraumatic.  Eyes: Right eye exhibits no discharge. Left eye exhibits no discharge. No scleral icterus.  Neck: Normal range of motion. Neck supple. No tracheal deviation present. No thyromegaly present.  Cardiovascular: Normal rate and regular rhythm.   Pulmonary/Chest: Effort normal and breath sounds normal. No respiratory distress. He has no wheezes. He has no rales. He exhibits no tenderness.  Abdominal: Soft. He exhibits no distension and no mass. There is no tenderness. There is no rebound and no guarding.  Hypoactive bowel sounds   Musculoskeletal: He exhibits edema.  Neurological: He is alert and oriented to person, place, and time.    Results for orders placed or performed during the hospital encounter of 03/14/17 (from the past 48 hour(s))  CBG monitoring, ED     Status: Abnormal   Collection Time: 03/14/17  7:38 PM  Result Value Ref Range   Glucose-Capillary 452 (H) 65 - 99 mg/dL   Comment 1 Notify RN    Comment 2 Document in Chart   CBC with Differential     Status: Abnormal   Collection Time: 03/14/17  8:31 PM  Result Value Ref Range   WBC 12.8 (H) 4.0 - 10.5 K/uL   RBC 4.39 4.22 - 5.81 MIL/uL   Hemoglobin 11.7 (L) 13.0 - 17.0 g/dL   HCT 36.5 (L) 39.0 - 52.0 %   MCV 83.1 78.0 - 100.0 fL   MCH 26.7 26.0 - 34.0 pg   MCHC 32.1 30.0 - 36.0 g/dL   RDW 14.3 11.5 - 15.5 %   Platelets 274 150 - 400 K/uL   Neutrophils Relative % 85 %   Neutro Abs 10.9 (H) 1.7 - 7.7 K/uL   Lymphocytes Relative 8 %   Lymphs Abs 1.1 0.7 - 4.0 K/uL   Monocytes Relative 6 %   Monocytes Absolute 0.7 0.1 - 1.0 K/uL   Eosinophils Relative 1 %   Eosinophils Absolute 0.1 0.0 - 0.7 K/uL   Basophils Relative 0 %   Basophils Absolute 0.0 0.0 - 0.1 K/uL  Comprehensive metabolic panel     Status: Abnormal   Collection Time: 03/14/17  8:31 PM  Result Value Ref Range   Sodium 130 (L) 135 - 145 mmol/L   Potassium 4.6 3.5 - 5.1 mmol/L   Chloride 91 (L) 101 - 111 mmol/L   CO2 27 22 - 32 mmol/L   Glucose, Bld 493 (H) 65 - 99 mg/dL   BUN 28 (H) 6 - 20 mg/dL   Creatinine, Ser 1.33 (H) 0.61 - 1.24 mg/dL   Calcium 9.0 8.9 - 10.3 mg/dL   Total Protein 8.0 6.5 - 8.1 g/dL   Albumin 2.8 (L) 3.5 - 5.0 g/dL   AST 13 (L) 15 - 41 U/L  ALT 13 (L) 17 - 63 U/L   Alkaline Phosphatase 160 (H) 38 - 126 U/L   Total Bilirubin 0.1 (L) 0.3 - 1.2 mg/dL   GFR calc non Af Amer 50 (L) >60 mL/min   GFR calc Af Amer 58 (L) >60 mL/min    Comment: (NOTE) The eGFR has been calculated using the CKD EPI equation. This calculation has not been validated in all  clinical situations. eGFR's persistently <60 mL/min signify possible Chronic Kidney Disease.    Anion gap 12 5 - 15  Lipase, blood     Status: Abnormal   Collection Time: 03/14/17  8:31 PM  Result Value Ref Range   Lipase 10 (L) 11 - 51 U/L  Urinalysis, Routine w reflex microscopic     Status: Abnormal   Collection Time: 03/14/17  8:31 PM  Result Value Ref Range   Color, Urine YELLOW YELLOW   APPearance HAZY (A) CLEAR   Specific Gravity, Urine 1.022 1.005 - 1.030   pH 7.0 5.0 - 8.0   Glucose, UA >=500 (A) NEGATIVE mg/dL   Hgb urine dipstick MODERATE (A) NEGATIVE   Bilirubin Urine NEGATIVE NEGATIVE   Ketones, ur NEGATIVE NEGATIVE mg/dL   Protein, ur NEGATIVE NEGATIVE mg/dL   Nitrite NEGATIVE NEGATIVE   Leukocytes, UA LARGE (A) NEGATIVE   RBC / HPF 0-5 0 - 5 RBC/hpf   WBC, UA TOO NUMEROUS TO COUNT 0 - 5 WBC/hpf   Bacteria, UA RARE (A) NONE SEEN   Squamous Epithelial / LPF 0-5 (A) NONE SEEN   Hyaline Casts, UA PRESENT   CBG monitoring, ED     Status: Abnormal   Collection Time: 03/15/17  1:11 AM  Result Value Ref Range   Glucose-Capillary 403 (H) 65 - 99 mg/dL  Basic metabolic panel     Status: Abnormal   Collection Time: 03/15/17  5:51 AM  Result Value Ref Range   Sodium 132 (L) 135 - 145 mmol/L   Potassium 3.8 3.5 - 5.1 mmol/L   Chloride 96 (L) 101 - 111 mmol/L   CO2 27 22 - 32 mmol/L   Glucose, Bld 331 (H) 65 - 99 mg/dL   BUN 20 6 - 20 mg/dL   Creatinine, Ser 1.02 0.61 - 1.24 mg/dL   Calcium 8.6 (L) 8.9 - 10.3 mg/dL   GFR calc non Af Amer >60 >60 mL/min   GFR calc Af Amer >60 >60 mL/min    Comment: (NOTE) The eGFR has been calculated using the CKD EPI equation. This calculation has not been validated in all clinical situations. eGFR's persistently <60 mL/min signify possible Chronic Kidney Disease.    Anion gap 9 5 - 15  Glucose, capillary     Status: Abnormal   Collection Time: 03/15/17  5:59 AM  Result Value Ref Range   Glucose-Capillary 365 (H) 65 - 99 mg/dL   Lactic acid, plasma     Status: None   Collection Time: 03/15/17  7:26 AM  Result Value Ref Range   Lactic Acid, Venous 1.7 0.5 - 1.9 mmol/L  Glucose, capillary     Status: Abnormal   Collection Time: 03/15/17  8:06 AM  Result Value Ref Range   Glucose-Capillary 232 (H) 65 - 99 mg/dL   Ct Abdomen Pelvis W Contrast  Result Date: 03/14/2017 CLINICAL DATA:  Severe umbilical pain radiating to the back with nausea and vomiting EXAM: CT ABDOMEN AND PELVIS WITH CONTRAST TECHNIQUE: Multidetector CT imaging of the abdomen and pelvis was performed using the standard  protocol following bolus administration of intravenous contrast. CONTRAST:  100 mL Isovue-300 intravenous COMPARISON:  Bone scan 11/13/2016, CT 11/13/2016, 11/11/2012 FINDINGS: Lower chest: Lung bases demonstrate no acute consolidation or effusion. Heart size within normal limits. Coronary artery calcifications. Hepatobiliary: No focal hepatic abnormality. Multiple calcified gallstones. No biliary dilatation. Pancreas: Unremarkable. No pancreatic ductal dilatation or surrounding inflammatory changes. Spleen: Normal in size without focal abnormality. Adrenals/Urinary Tract: Adrenal glands are within normal limits. Kidneys show no hydronephrosis. Thick-walled bladder but partially obscured by artifact from surgical hardware are in the hips. Stomach/Bowel: Multiple loops of fluid-filled borderline enlarged small bowel within the abdomen and pelvis with distal decompressed small bowel loops. No colon wall thickening. Prominent appendix tip but contains gas within its lumen and no evidence for inflammation. Scattered sigmoid colon diverticula without acute inflammation. Vascular/Lymphatic: Atherosclerosis of the aorta. Stable 0 right iliac lymph nodes measuring up to 1.5 cm on the right an 1.2 cm on the left. Reproductive: Obscured by artifact from pelvic hardware. No obvious masses. Other: No free air or free fluid.  Fat containing umbilical hernia.  Musculoskeletal: Status post bilateral hip replacements with associated artifact. Changes of left hemipelvis Paget's disease as before. Diffuse sclerosis of the right iliac bone and SI joint which may be posttraumatic, also unchanged. IMPRESSION: 1. Negative appendix. 2. Borderline fluid-filled enlarged loops of small bowel within the abdomen and pelvis. Findings could be secondary to an ileus or developing/partial small bowel obstruction. 3. Thick-walled urinary bladder. 4. Stable osseous structures with apparent left hemipelvis Paget's disease. Electronically Signed   By: Donavan Foil M.D.   On: 03/14/2017 23:02   Dg Abd Portable 1 View  Result Date: 03/15/2017 CLINICAL DATA:  NG tube placement EXAM: PORTABLE ABDOMEN - 1 VIEW COMPARISON:  CT abdomen and pelvis 03/14/2017 FINDINGS: Enteric tube is present with tip in the right upper quadrant consistent with location in the distal stomach. Mildly dilated gas-filled small bowel are demonstrated. IMPRESSION: Enteric tube tip is in the right upper quadrant consistent with location in the distal stomach. Electronically Signed   By: Lucienne Capers M.D.   On: 03/15/2017 01:20   Assessment/Plan Ileus vs. pSBO - no history of abdominal surgeries/hernias decreases likelihood of a true SBO requiring operative intervention. - recommend continuation of IVF and bowel rest; NGT OP currently 600 cc bilious - minimize narcotic pain medications as it could contribute to bowel dysmotility  - mobilize as tolerated   Jill Alexanders, [facility] Surgery 03/15/2017, 9:30 AM Pager: [phone] Consults: [phone] Mon-Fri 7:00 am-4:30 pm Sat-Sun 7:00 am-11:30 am

## 2017-03-15 NOTE — Progress Notes (Addendum)
Received patient from ED accompanied by RN and a family member.  Patient morbidly obese and slid to bed during transfer. BP & PR elevated at 158/82 and 121, respectively, CBG=403 while in ED, with Rt nare NGT and pain at 3/10.  Paged on-call FMTS MD [facility] for elevated CBG, BP and PR.  Dr. Brett Albino called back and no STAT lab verification orders. Administered Novolog 20 units and metoprolol 5 mg Inj per order. CBG check is Q4H.  Will recheck CBG at 0600.  Patient lying comfortably in bed with both eyes closed and pt's wife left [facility].  Will monitor.

## 2017-03-15 NOTE — ED Notes (Signed)
Pt reports he has not had 3 doses of his insulin.

## 2017-03-15 NOTE — Evaluation (Signed)
Physical Therapy Evaluation Patient Details Name: [name] MRN: [id number] DOB: Mar 24, 1941 Today's Date: 03/15/2017   History of Present Illness  [name] is a 76 year old male with multiple medical problems including, but not limited to, hypertension, hyperlipidemia, diabetes mellitus type 2, COPD, GERD, and Hx prostate adenocarcinoma, who presented to [facility] emergency department on the evening of 3/16 complaining of sharp, waxing and waning, centralized abdominal pain.   Probable ileus.  PMH includes DM, prostate CA, bil hip replacements, chronic venous insuffiency, chronic bil LE wounds.  Clinical Impression  Pt admitted with above diagnosis. Pt currently with functional limitations due to the deficits listed below (see PT Problem List). Pt was able to stand and pivot to get to recliner with crutches with min to min guard assist. Probably close to his baseline.  Will progress as able to maintain pt mobility.  Pt will benefit from skilled PT to increase their independence and safety with mobility to allow discharge to the venue listed below.      Follow Up Recommendations Home health PT;Supervision - Intermittent (HHAide)    Equipment Recommendations  Other (comment) (hospital bed)    Recommendations for Other Services       Precautions / Restrictions Precautions Precautions: Fall Precaution Comments: NG tube Restrictions Weight Bearing Restrictions: No      Mobility  Bed Mobility Overal bed mobility: Needs Assistance Bed Mobility: Supine to Sit     Supine to sit: Min guard     General bed mobility comments: Pt needed incr time to come to EOB but was able to do so without physical assist.    Transfers Overall transfer level: Needs assistance Equipment used: Crutches Transfers: Sit to/from [facility] Sit to Stand: Min assist Stand pivot transfers: Min guard;+2 safety/equipment       General transfer comment: Pt was able to stand with min  assist to power up.  Pt used momentum to come to standing.  Once up, able to move crutches to position without assist.  Pt with widened BOS.  Pt used crutches and wide BOS and was able to pivot safely to recliner.   Ambulation/Gait                Stairs            Wheelchair Mobility    Modified Rankin (Stroke Patients Only)       Balance Overall balance assessment: Needs assistance Sitting-balance support: No upper extremity supported;Feet supported Sitting balance-Leahy Scale: Fair     Standing balance support: Bilateral upper extremity supported;During functional activity Standing balance-Leahy Scale: Poor Standing balance comment: relied on crutches for balance             High level balance activites: Turns High Level Balance Comments: min guard assist with crutches             Pertinent Vitals/Pain Pain Assessment: No/denies pain  VSS  Home Living Family/patient expects to be discharged to:: Private residence Living Arrangements: Alone Available Help at Discharge: Personal care attendant;Available PRN/intermittently (Just approved for aide M,W 3 hours;Fri 2 hours) Type of Home: Apartment Home Access: Elevator     Home Layout: One level Home Equipment: Crutches;Wheelchair - power;Grab bars - toilet;Bedside commode Additional Comments: uses scooter most of the time per pt but does use crutches to bathroom and short distances in home    Prior Function Level of Independence: Independent with assistive device(s)   Gait / Transfers Assistance Needed: used power wheelchair most of time  and crutches at times  ADL's / Homemaking Assistance Needed: sponge bathed at sink sitting on 3N1 because too hard to get into shower  Comments: Pt reports he stays in his recliner alot     Hand Dominance   Dominant Hand: Right    Extremity/Trunk Assessment   Upper Extremity Assessment Upper Extremity Assessment: Defer to OT evaluation    Lower Extremity  Assessment Lower Extremity Assessment: Generalized weakness    Cervical / Trunk Assessment Cervical / Trunk Assessment: Normal  Communication   Communication: No difficulties  Cognition Arousal/Alertness: Awake/alert Behavior During Therapy: WFL for tasks assessed/performed Overall Cognitive Status: Within Functional Limits for tasks assessed                      General Comments General comments (skin integrity, edema, etc.): bil LEs in wraps - nursing had just wrapped LEs    Exercises     Assessment/Plan    PT Assessment Patient needs continued PT services  PT Problem List Decreased activity tolerance;Decreased balance;Decreased mobility;Decreased knowledge of use of DME;Decreased safety awareness;Decreased knowledge of precautions;Obesity;Decreased skin integrity       PT Treatment Interventions DME instruction;Gait training;Functional mobility training;Therapeutic activities;Therapeutic exercise;Balance training;Patient/family education    PT Goals (Current goals can be found in the Care Plan section)  Acute Rehab PT Goals Patient Stated Goal: to go home PT Goal Formulation: With patient Time For Goal Achievement: 03/29/17 Potential to Achieve Goals: Good    Frequency Min 3X/week   Barriers to discharge        Co-evaluation               End of Session Equipment Utilized During Treatment: Gait belt Activity Tolerance: Patient tolerated treatment well Patient left: in chair;with call bell/phone within reach Nurse Communication: Mobility status PT Visit Diagnosis: Unsteadiness on feet (R26.81);Other abnormalities of gait and mobility (R26.89)         Time: 1000-1028 PT Time Calculation (min) (ACUTE ONLY): 28 min   Charges:   PT Evaluation $PT Eval Moderate Complexity: 1 Procedure PT Treatments $Therapeutic Activity: 8-22 mins   PT G Codes:         [name] 04-14-17, 12:10 PM [name],PT Acute  Rehabilitation [phone] (pager)

## 2017-03-15 NOTE — H&P (Signed)
[facility] Admission History and Physical Service Pager: [phone]  Patient name: [name] Medical record number: [id number] Date of birth: 03-13-1941 Age: 76 y.o. Gender: male  Primary Care Provider: Lockie Pares, MD Consultants: Surgery Code Status: Full  Chief Complaint: Central abdominal pain.  Assessment and Plan: [name] is a 76 y.o. male presenting with abdominal pain, found to have partial SBO. PMH is significant for HTN, T2DM, HLD, morbid obesity, hx prostate adenocarcinoma, chronic severe lower extremity lymphedema, hx chronic osteomyelitis of right tibia.  Partial SBO vs Ileus: Pt with severe centralized abdominal pain after having a large amount of diarrhea. Also had vomiting x 3 in the ED. Last BM was around 5PM this evening. CT abdomen/pelvis with enlarged loops of small bowel consistent with ileus vs developing/partial SBO. No history of abdominal surgeries. On exam, he has moderate-severe tenderness in the periumbilical region. No signs of acute abdomen. Vitals stable. - Admit to med-surg under inpatient status, attending Dr. Gwendlyn Deutscher. - Obtain surgery consult in the AM - NG tube placed in ED - NPO - Morphine 22m q4hrs prn for pain, Zofran for nausea - Monitor I/O - Vitals per unit routine - Repeat CBC, BMET in the AM - PT/OT consult  Dysuria: Pt endorses dysuria a few times per week. Also notes some increased frequency. CT abdomen/pelvis showed a thick-walled urinary bladder. UA with large leukocytes, >500 glucose, negative nitrites, TNTC WBCs. Seems more like a chronic issue for him. No fevers, no chills. - Hold on antibiotics - Urine culture pending - Monitor urine output  AKI: Cr 1.33 in the ED (BL 0.8-0.9, although not many recent values). - s/p 1L bolus in the ED, continue MIVFs - Hold Lasix - Repeat BMET in the AM  Tachycardia: HRs initially in the 80s-90s in the ED, but increased to 108-121 after NG tube  placement. Likely secondary to pain and discomfort. - Convert home Lopressor 1010mPO bid to Lopressor 40m68mV q6hrs.  - MIVFs at 150m14m - Monitor  Hyponatremia: Na 130 in the setting of hyperglycemia to 493. Corrected sodium 136. Patient euvolemic. - Recheck BMET in the AM  Normocytic Anemia: stable. Hgb 11.7 in the ED, MCV 83. Baseline 10-12. - Repeat CBC in the AM  HTN: Takes Lopressor 100mg27m, Triamterene-HCTZ 37.5-240mg at home. Has documented allergy to ACE/ARB. - Holding home Lopressor PO and Triamterene-HCTZ while NPO - Lopressor 40mg I39m6hrs scheduled and Hydralazine IV prn  T2DM: Last A1c was 11.8% on 10/25/16. Takes Levemir 60 units bid and Novolog 45 units bid at home. CBG 403 on admission. - CBGs and rSSI every 4 hours while NPO - Monitor amount of sliding scale insulin he is getting and will add Levemir if appropriate - Repeat A1c  HLD: Last lipid panel in 2015 with Chol 131, HDL 39, LDL 83, TG 45. - Holding home Pravastatin while NPO  COPD: stable. On Albuterol prn and Flovent 1 puff bid at home - Continue home Albuterol prn - Pulmicort neb while hospitalized  Hx Prostate Cancer: Diagnosed 6-7 years ago. Not currently undergoing treatment. Per chart review, unable to afford his co-pay to see the urologist. Bone scan from 10/2016 did not show any metastatic disease. - Holding home Flomax while NPO - Follow-up as outpatient  Chronic Severe Lower Extremity Lymphedema: Pt has bilateral lower extremity edema L > R. He states this left leg always swells more than his right leg because he had a rod placed in it. He  wraps his legs at home to keep the swelling down. - Holding home Lasix with AKI - Wound consult - Continue home Silvadene cream  Hx Chronic Osteomyelitis of Right Tibia: Used to follow with Dr. Sharol Given. Takes Tramadol at home. - Holding home Tramadol while NPO and giving IV Morphine  GERD: stable - Holding home Nexium while NPO  ?Paget's Disease: CT abdomen  and pelvis showed left hemipelvis Paget's disease. Has been noted on previous bone scans and abdominal CTs. - Follow-up as outpatient  FEN/GI: NPO, D5NS at 162m/hr Prophylaxis: Lovenox  Disposition: Pending improvement of SBO and ability to tolerate diet. Anticipate discharge home in 3-5 days.  History of Present Illness:  Joel FLEMISTERis a 76y.o. male presenting with abdominal pain starting 5PM. The abdominal pain started after he had a large amount of diarrhea. The abdominal pain waxes and wanes and feels "sharp". The pain is located around his umbilicus. The pain moves all across the abdomen. He did not vomit at home, but did vomit 3 times in the ED. Before he had the episode of diarrhea earlier today, he was having a mixture of diarrhea and constipation. He states this has been normal for him over the past year. No hematochezia, no melena, no hematemesis.  Per patient, he had a perforated ulcer 30 years ago diagnosed by EGD. He has never had any abdominal surgeries before.   Patient lives at home by himself. He uses a power chair to get around at home. He uses crutches for transferring. He does not have any assistance in the home currently, but he just got an aide who is supposed to start Monday.  In the ED, he was hypertensive to 177/92, HRs initially 80s-90s but increased to 108-122. He was afebrile. Labs were remarkable for Na 130, glucose 493, Cr 1.33, alk phos 160, WBC 12.8, Hgb 11.7. UA with >500 glucose, large LE, 0-5 squams, TNTC WBCs. EKG without signs of ischemia or infarct. CT abd/pelvis showed borderline fluid-filled enlarged loops of small bowel, likely 2/2 ileus or developing/partial SBO. He was made NPO. NG tube was placed and placement confirmed with abdominal x-ray. He was given a 1L bolus NS and morphine 675mx 1. He was admitted for further management.  Review Of Systems: Per HPI with the following additions: see below.  Review of Systems  Constitutional: Negative for  chills and fever.  HENT: Negative for congestion and sore throat.   Eyes: Negative for blurred vision and double vision.  Respiratory: Positive for shortness of breath. Negative for cough.   Cardiovascular: Negative for chest pain and leg swelling.  Gastrointestinal: Positive for abdominal pain, constipation, diarrhea, nausea and vomiting. Negative for blood in stool and melena.  Genitourinary: Positive for dysuria. Negative for urgency.  Musculoskeletal: Positive for joint pain. Negative for falls.  Skin: Negative for rash.  Neurological: Negative for dizziness and headaches.    Patient Active Problem List   Diagnosis Date Noted  . SBO (small bowel obstruction) 03/15/2017  . Vomiting 03/15/2017  . Elephantiasis nostras 03/29/2016  . Impaired mobility 09/09/2014  . Diabetic peripheral neuropathy associated with type 2 diabetes mellitus ([facility]02/08/2014  . Ulcer of left lower leg ([facility]CR[facility] [place]10/06/2012  . Anemia 05/21/2012  . Chronic leg pain 11/17/2011  . COPD, severity to be determined (HCDor[place]05/15/2012  . Chronic osteomyelitis of lower leg (HCEll[place]11/22/2011  . HLD (hyperlipidemia) 12/01/2008  . ADENOCARCINOMA, PROSTATE 05/24/2008  . DM type 2, uncontrolled, with neuropathy (HC[facility]01/21/2009  . Morbid  obesity with body mass index of 50.0-59.9 in adult ([place]) 03/05/2007  . Essential hypertension 03/05/2007    Past Medical History: Past Medical History:  Diagnosis Date  . COLON POLYP 02/26/2007   Qualifier: Diagnosis of  By: Erling Cruz  MD, MELISSA    . Complication of anesthesia   . Diabetes mellitus   . GERD (gastroesophageal reflux disease)   . Heart murmur    last 2D Echo -03/31/08  . HIATAL HERNIA WITH REFLUX 09/29/2006   Qualifier: Diagnosis of  By: Erling Cruz  MD, MELISSA    . HIP REPLACEMENT, BILATERAL, HX OF 05/12/2007   Qualifier: Diagnosis of  By: Erling Cruz  MD, MELISSA    . History of blood transfusion   . Hypertension   . OSTEOARTHRITIS 03/05/2007   Qualifier: Diagnosis of  By: Erling Cruz  MD,  MELISSA    . PONV (postoperative nausea and vomiting)   . Prostate cancer ([place])   . Seasonal allergies   . VENOUS INSUFFICIENCY, CHRONIC 02/26/2007   Qualifier: Diagnosis of  By: Erling Cruz  MD, MELISSA      Past Surgical History: Past Surgical History:  Procedure Laterality Date  . COLONOSCOPY    . COLONOSCOPY    . HIP ARTHROPLASTY     bil  . I&D EXTREMITY  08/14/2012   Procedure: IRRIGATION AND DEBRIDEMENT EXTREMITY;  Surgeon: Newt Minion, MD;  Location: [place];  Service: Orthopedics;  Laterality: Right;  Irrigation and Debridement Right tibia, Placement antibiotic beads  . I&D EXTREMITY Left 03/18/2014   Procedure: IRRIGATION AND DEBRIDEMENT EXTREMITY;  Surgeon: Newt Minion, MD;  Location: [place];  Service: Orthopedics;  Laterality: Left;  Debridement Left Calf Ulcer, Apply Theraskin and Wound VAC  . IRRIGATION AND DEBRIDEMENT ABSCESS Left 03/18/2014   DR DUDA  . LEG SURGERY     ROD for fracture  . LEG WOUND REPAIR / CLOSURE     Beads .  Skin wound  . SKIN GRAFT     right leg- from donor skin  . SKIN GRAFT  1976   R wrist   skin graft from left thigh  . UPPER GASTROINTESTINAL ENDOSCOPY    . WISDOM TOOTH EXTRACTION    . WRIST FUSION     right wrist    Social History: Social History  Substance Use Topics  . Smoking status: Never Smoker  . Smokeless tobacco: Never Used  . Alcohol use No   Additional social history: none Please also refer to relevant sections of EMR.  Family History: Family History  Problem Relation Age of Onset  . Hypertension Mother     Allergies and Medications: Allergies  Allergen Reactions  . Ace Inhibitors Cough    With Lisinopril.  . Angiotensin Receptor Blockers Other (See Comments)    Caused excessive weight gain  . Metformin Diarrhea   No current facility-administered medications on file prior to encounter.    Current Outpatient Prescriptions on File Prior to Encounter  Medication Sig Dispense Refill  . aspirin 81 MG chewable tablet  Chew 81 mg by mouth every other day.     . Blood Glucose Monitoring Suppl (ONE TOUCH ULTRA SYSTEM KIT) W/DEVICE KIT 1 kit by Does not apply route once. 1 each 0  . EASY TOUCH PEN NEEDLES 31G X 8 MM MISC USE WITH FLEXPEN AS DIRECTED 100 each 8  . esomeprazole (NEXIUM) 40 MG capsule TAKE 1 CAPSULE BY MOUTH EVERY DAY 60 capsule 0  . FLOVENT HFA 220 MCG/ACT inhaler INHALE 1 PUFF INTO THE  LUNGS TWICE DAILY 3 Inhaler 2  . fluticasone (CUTIVATE) 0.05 % cream Apply 1 application topically 2 (two) times daily. Reported on 05/23/2016    . furosemide (LASIX) 40 MG tablet TAKE 1 TABLET BY MOUTH TWICE DAILY. 60 tablet 2  . Insulin Detemir (LEVEMIR FLEXTOUCH) 100 UNIT/ML Pen Inject 50 Units into the skin 2 (two) times daily. (Patient taking differently: Inject 60 Units into the skin 2 (two) times daily. ) 45 mL 10  . Lancet Devices (ONE TOUCH DELICA LANCING DEV) MISC 75 Units by Does not apply route 2 (two) times daily. 1 each 0  . latanoprost (XALATAN) 0.005 % ophthalmic solution INSTILL 1 DROP IN BOTH EYES EVERY EVENING FOR GLAUCOMA 2.5 mL 1  . metoprolol (LOPRESSOR) 100 MG tablet TAKE 1 TABLET BY MOUTH TWICE DAILY 60 tablet 0  . mometasone (NASONEX) 50 MCG/ACT nasal spray PLACE 2 SPRAYS INTO THE NOSE DAILY 17 g 3  . mupirocin ointment (BACTROBAN) 2 % Apply 1 application topically 2 (two) times daily. To legs    . NOVOLOG FLEXPEN 100 UNIT/ML FlexPen INJECT 45 UNITS SUBCUTANEOUSLY TWICE DAILY 27 pen 3  . ONE TOUCH ULTRA TEST test strip USE TO CHECK BLOOD SUGAR FOUR TIMES A DAY 100 each 11  . ONE TOUCH ULTRA TEST test strip USE AS DIRECTED 100 each 11  . ONETOUCH DELICA LANCETS 56E MISC USE TO CHECK BLOOD SUGAR TWICE DAILY 200 each 10  . pravastatin (PRAVACHOL) 40 MG tablet TAKE 1 TABLET BY MOUTH DAILY 90 tablet 3  . PROAIR HFA 108 (90 Base) MCG/ACT inhaler INHALE 2 PUFFS BY MOUTH EVERY 4 HOURS AS NEEDED FOR WHEEZING OR SHORTNESS OF BREATH 8.5 each 2  . silver sulfADIAZINE (SILVADENE) 1 % cream APPLY AS DIRECTED  TO WOUND EVERY DAY. 50 g 0  . tamsulosin (FLOMAX) 0.4 MG CAPS capsule TAKE 2 CAPSULES BY MOUTH EVERY MORNING 60 capsule 0  . traMADol (ULTRAM) 50 MG tablet TAKE 1 TABLET BY MOUTH TWICE DAILY AS NEEDED 60 tablet 0  . triamcinolone ointment (KENALOG) 0.1 % APPLY TO AFFECTED AREA TWICE DAILY AS NEEDED 80 g 0  . triamterene-hydrochlorothiazide (MAXZIDE-25) 37.5-25 MG tablet TAKE 1 TABLET BY MOUTH EVERY DAY FOR BLOOD PRESSURE. 30 tablet 2  . latanoprost (XALATAN) 0.005 % ophthalmic solution INSTILL 1 DROP IN BOTH EYES EVERY EVENING FOR GLAUCOMA. (Patient not taking: Reported on 03/14/2017) 2.5 mL 2    Objective: BP (!) 154/89   Pulse (!) 105   Temp 98.2 F (36.8 C) (Oral)   Resp 18   SpO2 97%  Exam: General: Tired-appearing male, appears to be in mild discomfort Eyes: Arcus senilis, EOMI, PERRLA ENTM: Oropharynx clear, MMM, NG tube in place Neck: Supple, no cervical lymphadenopathy or thyromegaly Cardiovascular: Mildly tachycardic, regular rhythm, II/VI systolic murmur heard loudest in the LUSB Respiratory: Difficult lung exam but lungs seemingly clear. Gastrointestinal: +BS, soft, +tenderness to palpation in the periumbilical area, no rebound, no guarding MSK: Edema L > R, no calf tenderness, warm and well-perfused Derm: Significant lymphedema and venous stasis changes present in the lower extremities. Legs wrapped in dressings. Neuro: Awake, alert, oriented x 3, CN 2-12 grossly intact, no focal deficits Psych: Appropriate affect, normal behavior.  Labs and Imaging: CBC BMET   Recent Labs Lab 03/14/17 2031  WBC 12.8*  HGB 11.7*  HCT 36.5*  PLT 274    Recent Labs Lab 03/14/17 2031  NA 130*  K 4.6  CL 91*  CO2 27  BUN 28*  CREATININE 1.33*  GLUCOSE 493*  CALCIUM 9.0     CT abdomen/pelvis: Negative appendix; borderline fluid-filled enlarged loops of small bowel within the abdomen and pelvis. Findings could be secondary to an ileus or developing/partial SBT; thick-walled  urinary bladder; stable osseous structures with apparent left hemipelvis Paget's disease.  Sela Hua, MD 03/15/2017, 12:32 AM PGY-2, [facility] pager: [phone], text pages welcome

## 2017-03-15 NOTE — Progress Notes (Signed)
FPTS Interim Progress Note  S: Pt states he is feeling a little better this morning. His abdomen is less painful. No vomiting. No flatulence, no bowel movements.  O: BP (!) 128/57 (BP Location: Left Arm)   Pulse 92   Temp 97.8 F (36.6 C) (Oral)   Resp 18   SpO2 96%   General: Well-appearing, in NAD HEENT: [place]/AT, NG tube in place Neck: Supple, no cervical lymphadenopathy or thyromegaly Cardiovascular: RRR, II/VI systolic murmur heard loudest in the LUSB Respiratory: Difficult lung exam but lungs seemingly clear. Gastrointestinal: +BS, soft, +mild tenderness to palpation in the periumbilical area, no rebound, no guarding MSK: Edema L > R, no calf tenderness, warm and well-perfused Derm: Significant lymphedema and venous stasis changes present in the lower extremities. Legs wrapped in dressings. Neuro: Awake, alert, oriented x 3, CN 2-12 grossly intact, no focal deficits Psych: Appropriate affect, normal behavior.  A/P: OSAZE HUBBERT is a 76 y.o. male presenting with abdominal pain, found to have partial SBO. PMH is significant for HTN, T2DM, HLD, morbid obesity, hx prostate adenocarcinoma, chronic severe lower extremity lymphedema, hx chronic osteomyelitis of right tibia.  Partial SBO vs Ileus: Vitals continue to remain stable. No BM. Not passing gas. - Surgery consulted, appreciate recommendations - Continue NGT - NPO - Morphine 4mg  q4hrs prn for pain, Zofran for nausea - PT/OT consult  Dysuria: Chronic. Pt endorses dysuria a few times per week. UA with large LE. May be related to osmotic diuresis in the setting of poorly controlled diabetes. - Hold on antibiotics - Urine culture pending - Monitor urine output  AKI: Cr 1.33 in the ED > 1.02 this morning (BL 0.8-0.9, although not many recent values). - MIVFs - Hold Lasix - Trend BMET  Sinus Tachycardia: Improved. HR up to 121 after placement of NGT. Likely 2/2 pain. HR improved to 92 this morning. - Convert home  Lopressor 100mg  PO bid to Lopressor 5mg  IV q6hrs.  - MIVFs at 184ml/hr - Monitor  Normocytic Anemia: stable. Hgb 11.7 in the ED, MCV 83. Baseline 10-12. - Trend CBCs  HTN: Improving. BP 125/57 this morning. Takes Lopressor 100mg  bid, Triamterene-HCTZ 37.5-25mg  at home.  - Holding home Lopressor PO and Triamterene-HCTZ while NPO - Lopressor 5mg  IV q6hrs scheduled and Hydralazine IV prn  T2DM: Last A1c was 11.8% on 10/25/16. Takes Levemir 60 units bid and Novolog 45 units bid at home. CBG 403 on admission > 365 > 232 this morning. - CBGs and rSSI every 4 hours while NPO - Monitor amount of sliding scale insulin he is getting and will add Levemir tonight - Repeat A1c  HLD: Last lipid panel in 2015 with Chol 131, HDL 39, LDL 83, TG 45. - Holding home Pravastatin while NPO  COPD: stable. On Albuterol prn and Flovent 1 puff bid at home. Lungs clear. - Continue home Albuterol prn - Pulmicort neb while hospitalized  Hx Prostate Cancer: Diagnosed 6-7 years ago. Not currently undergoing treatment.  - Holding home Flomax while NPO - Follow-up as outpatient  Chronic Severe Lower Extremity Lymphedema:  - Holding home Lasix with AKI - Wound consult - Continue home Silvadene cream  Hx Chronic Osteomyelitis of Right Tibia: Used to follow with Dr. Sharol Given. Takes Tramadol at home. - Holding home Tramadol while NPO and giving IV Morphine  GERD: stable - Holding home Nexium while NPO  ?Paget's Disease: CT abdomen and pelvis showed left hemipelvis Paget's disease. Has been noted on previous bone scans and abdominal CTs. - Follow-up  as outpatient  FEN/GI: NPO, D5NS at 196ml/hr Prophylaxis: Lovenox  Sela Hua, MD 03/15/2017, 6:50 AM PGY-2, [facility] pager [phone]

## 2017-03-15 NOTE — Progress Notes (Signed)
Family Medicine Teaching Service Daily Progress Note Intern Pager: [phone]  Patient name: [name] Medical record number: [id number] Date of birth: Feb 04, 1941 Age: 76 y.o. Gender: male  Primary Care Provider: Lockie Pares, MD Consultants: Surgery Code Status: FULL  Pt Overview and Major Events to Date:  3/17: Patient admitted for partial SBO, NG tube placed   Assessment and Plan: [name] a 76 y.o.malepresenting with abdominal pain, found to have partial SBO. PMH is significant for HTN, T2DM, HLD, morbid obesity, hx prostate adenocarcinoma, chronic severe lower extremity lymphedema, hx chronic osteomyelitis of right tibia.  Partial SBO vs Ileus: Surgery does not believe to be complete bowel obstruction; more likely ileus. Vitals continue to remain stable. No BM. Not passing gas but feels "rumbling in his belly".  Has had good output from NG tube (1.8L).  - Surgery consulted, appreciate recommendations - Continue NGT - NPO - d/c Morphine 4mg  q4hrs prn for pain as patient did not require any pain medication for almost 24h - Zofran for nausea - PT/OT consult  Dysuria with UTI: Chronic. Pt endorses dysuria a few times per week. UA with large LE. Now also endorsing suprapubic pain with urination. Afebrile.  UOP stable at 1.7L. Urine culture with >100k Proteus Mirabilis. May be related to osmotic diuresis in the setting of poorly controlled diabetes; however, given symptoms and urine culture findings will treat for UTI. - Augmentin (3/18 >)  - Urine sensitivities pending - Monitor urine output -PVR   AKI, Resolved: Cr 0.9.  - D5 1/2NS at 150 cc/hr while NPO >> will switch to NS today d/t elevated CBGs overnight  - Hold Lasix - Trend BMET  Sinus Tachycardia, Resolved: Likely elevated due to placement of NG. Now NSR.  - Convert home Lopressor 100mg  PO bid to Lopressor 5mg  IV q6hrs.  - IVFs at 137ml/hr - Monitor  Normocytic Anemia: stable. Hgb 10.0, MCV 84.  Baseline 10-12. - Trend CBCs  HTN: Stable. Takes Lopressor 100mg  bid, Triamterene-HCTZ 37.5-25mg  at home.  - Holding home Lopressor PO and Triamterene-HCTZ while NPO - Lopressor 5mg  IV q6hrs scheduled and Hydralazine IV prn  T2DM: Last A1c was 11.8% on 10/25/16. Takes Levemir 60 units bid and Novolog 45 units bid at home. CBG 190-300. - CBGs and rSSI every 4 hours while NPO -elected not to resume Levemir while NPO because high CBGs present at admission now well controlled  - Repeat A1c -remove dextrose from fluids   HLD: Last lipid panel in 2015 with Chol 131, HDL 39, LDL 83, TG 45. - Holding home Pravastatin while NPO  COPD: stable. On Albuterol prn and Flovent 1 puff bid at home. Lungs clear. - Continue home Albuterol prn - Pulmicort neb while hospitalized  Hx Prostate Cancer: Diagnosed 6-7 years ago. Not currently undergoing treatment.  - Holding home Flomax while NPO - Follow-up as outpatient  Chronic Severe Lower Extremity Lymphedema:  - Holding home Lasix with AKI - Wound consult - Continue home Silvadene cream  Hx Chronic Osteomyelitis of Right Tibia: Used to follow with Dr. Sharol Given. Takes Tramadol at home. - Holding home Tramadol while NPO and giving IV Morphine  GERD: stable - Holding home Nexium while NPO  ?Paget's Disease: CT abdomen and pelvis showed left hemipelvis Paget's disease. Has been noted on previous bone scans and abdominal CTs. - Follow-up as outpatient  FEN/GI: NPO, NS at 15ml/hr Prophylaxis: Lovenox  Disposition: Pending surgery recs   Subjective:  Feeling well this morning. Denies significant nausea and no  vomiting. Minimal abdominal pain. Endorses dysuria and suprapubic pain with urination since being hospitalized. Reports no BM or flatus but endorses rumbling sensation in his abdomen.   Objective: Temp:  [97.7 F (36.5 C)-98.2 F (36.8 C)] 97.7 F (36.5 C) (03/17 1343) Pulse Rate:  [75-121] 75 (03/17 1343) Resp:  [14-21] 18  (03/17 1343) BP: (105-177)/(57-99) 105/61 (03/17 1343) SpO2:  [88 %-99 %] 98 % (03/17 1343) Physical Exam: General: Well-appearing, in NAD HEENT: [place]/AT, NG tube in place Cardiovascular: RRR, II/VI systolic murmur heard loudest in the LUSB Respiratory: Difficult lung exam but lungs seemingly clear. Gastrointestinal: +BS, soft, +mild tenderness to palpation in the periumbilical area, no rebound, no guarding MSK: Edema L > R, no calf tenderness, warm and well-perfused Derm: Significant lymphedema and venous stasis changes present in the lower extremities. Legs wrapped in dressings.  Laboratory:  Recent Labs Lab 03/14/17 2031  WBC 12.8*  HGB 11.7*  HCT 36.5*  PLT 274    Recent Labs Lab 03/14/17 2031 03/15/17 0551  NA 130* 132*  K 4.6 3.8  CL 91* 96*  CO2 27 27  BUN 28* 20  CREATININE 1.33* 1.02  CALCIUM 9.0 8.6*  PROT 8.0  --   BILITOT 0.1*  --   ALKPHOS 160*  --   ALT 13*  --   AST 13*  --   GLUCOSE 493* 331*    Imaging/Diagnostic Tests: Ct Abdomen Pelvis W Contrast  Result Date: 03/14/2017 CLINICAL DATA:  Severe umbilical pain radiating to the back with nausea and vomiting EXAM: CT ABDOMEN AND PELVIS WITH CONTRAST TECHNIQUE: Multidetector CT imaging of the abdomen and pelvis was performed using the standard protocol following bolus administration of intravenous contrast. CONTRAST:  100 mL Isovue-300 intravenous COMPARISON:  Bone scan 11/13/2016, CT 11/13/2016, 11/11/2012 FINDINGS: Lower chest: Lung bases demonstrate no acute consolidation or effusion. Heart size within normal limits. Coronary artery calcifications. Hepatobiliary: No focal hepatic abnormality. Multiple calcified gallstones. No biliary dilatation. Pancreas: Unremarkable. No pancreatic ductal dilatation or surrounding inflammatory changes. Spleen: Normal in size without focal abnormality. Adrenals/Urinary Tract: Adrenal glands are within normal limits. Kidneys show no hydronephrosis. Thick-walled bladder but  partially obscured by artifact from surgical hardware are in the hips. Stomach/Bowel: Multiple loops of fluid-filled borderline enlarged small bowel within the abdomen and pelvis with distal decompressed small bowel loops. No colon wall thickening. Prominent appendix tip but contains gas within its lumen and no evidence for inflammation. Scattered sigmoid colon diverticula without acute inflammation. Vascular/Lymphatic: Atherosclerosis of the aorta. Stable 0 right iliac lymph nodes measuring up to 1.5 cm on the right an 1.2 cm on the left. Reproductive: Obscured by artifact from pelvic hardware. No obvious masses. Other: No free air or free fluid.  Fat containing umbilical hernia. Musculoskeletal: Status post bilateral hip replacements with associated artifact. Changes of left hemipelvis Paget's disease as before. Diffuse sclerosis of the right iliac bone and SI joint which may be posttraumatic, also unchanged. IMPRESSION: 1. Negative appendix. 2. Borderline fluid-filled enlarged loops of small bowel within the abdomen and pelvis. Findings could be secondary to an ileus or developing/partial small bowel obstruction. 3. Thick-walled urinary bladder. 4. Stable osseous structures with apparent left hemipelvis Paget's disease. Electronically Signed   By: Donavan Foil M.D.   On: 03/14/2017 23:02   Dg Abd Portable 1 View  Result Date: 03/15/2017 CLINICAL DATA:  NG tube placement EXAM: PORTABLE ABDOMEN - 1 VIEW COMPARISON:  CT abdomen and pelvis 03/14/2017 FINDINGS: Enteric tube is present with tip in  the right upper quadrant consistent with location in the distal stomach. Mildly dilated gas-filled small bowel are demonstrated. IMPRESSION: Enteric tube tip is in the right upper quadrant consistent with location in the distal stomach. Electronically Signed   By: Lucienne Capers M.D.   On: 03/15/2017 01:20    Nicolette Bang, DO 03/15/2017, 6:15 PM PGY-2, [place] Intern pager:  [phone], text pages welcome

## 2017-03-16 ENCOUNTER — Inpatient Hospital Stay (HOSPITAL_COMMUNITY): Payer: PPO

## 2017-03-16 DIAGNOSIS — N390 Urinary tract infection, site not specified: Secondary | ICD-10-CM

## 2017-03-16 LAB — BASIC METABOLIC PANEL
ANION GAP: 5 (ref 5–15)
BUN: 13 mg/dL (ref 6–20)
CALCIUM: 8.1 mg/dL — AB (ref 8.9–10.3)
CO2: 30 mmol/L (ref 22–32)
Chloride: 100 mmol/L — ABNORMAL LOW (ref 101–111)
Creatinine, Ser: 0.92 mg/dL (ref 0.61–1.24)
GFR calc Af Amer: >60 mL/min (ref >60)
GLUCOSE: 206 mg/dL — AB (ref 65–99)
Potassium: 3.8 mmol/L (ref 3.5–5.1)
Sodium: 135 mmol/L (ref 135–145)

## 2017-03-16 LAB — CBC
HCT: 32.4 % — ABNORMAL LOW (ref 39.0–52.0)
HEMOGLOBIN: 10 g/dL — AB (ref 13.0–17.0)
MCH: 26 pg (ref 26.0–34.0)
MCHC: 30.9 g/dL (ref 30.0–36.0)
MCV: 84.2 fL (ref 78.0–100.0)
Platelets: 248 10*3/uL (ref 150–400)
RBC: 3.85 MIL/uL — ABNORMAL LOW (ref 4.22–5.81)
RDW: 14.4 % (ref 11.5–15.5)
WBC: 7.6 10*3/uL (ref 4.0–10.5)

## 2017-03-16 LAB — GLUCOSE, CAPILLARY
GLUCOSE-CAPILLARY: 155 mg/dL — AB (ref 65–99)
GLUCOSE-CAPILLARY: 177 mg/dL — AB (ref 65–99)
GLUCOSE-CAPILLARY: 191 mg/dL — AB (ref 65–99)
GLUCOSE-CAPILLARY: 301 mg/dL — AB (ref 65–99)
Glucose-Capillary: 228 mg/dL — ABNORMAL HIGH (ref 65–99)
Glucose-Capillary: 155 mg/dL — ABNORMAL HIGH (ref 65–99)

## 2017-03-16 MED ORDER — AMOXICILLIN-POT CLAVULANATE 875-125 MG PO TABS
1.0000 | ORAL_TABLET | Freq: Two times a day (BID) | ORAL | Status: DC
  Administered 2017-03-16 – 2017-03-19 (×7): 1 via ORAL
  Filled 2017-03-16 (×7): qty 1

## 2017-03-16 MED ORDER — SODIUM CHLORIDE 0.9 % IV SOLN
INTRAVENOUS | Status: DC
Start: 2017-03-16 — End: 2017-03-18
  Administered 2017-03-16 – 2017-03-18 (×5): via INTRAVENOUS
  Filled 2017-03-16: qty 1000

## 2017-03-16 MED ORDER — DIATRIZOATE MEGLUMINE & SODIUM 66-10 % PO SOLN
90.0000 mL | Freq: Once | ORAL | Status: AC
  Administered 2017-03-16: 90 mL via NASOGASTRIC
  Filled 2017-03-16: qty 90

## 2017-03-16 MED ORDER — ENOXAPARIN SODIUM 40 MG/0.4ML ~~LOC~~ SOLN
40.0000 mg | SUBCUTANEOUS | Status: DC
  Administered 2017-03-16 – 2017-03-18 (×3): 40 mg via SUBCUTANEOUS
  Filled 2017-03-16 (×3): qty 0.4

## 2017-03-16 NOTE — Progress Notes (Signed)
Patient voided 100cc urine. Bladder scan done and no urine visible.

## 2017-03-16 NOTE — Progress Notes (Signed)
Subjective: No flatus or BM overnight Feels a lot of rumbling in his abdomen Minimal discomfort - mild distention  Objective: Vital signs in last 24 hours: Temp:  [97.7 F (36.5 C)-98.7 F (37.1 C)] 98.7 F (37.1 C) (03/18 0425) Pulse Rate:  [75-77] 77 (03/18 0425) Resp:  [18] 18 (03/18 0425) BP: (105-115)/(52-62) 115/52 (03/18 0425) SpO2:  [93 %-98 %] 93 % (03/18 0425) Last BM Date: 03/14/17  Intake/Output from previous day: 03/17 0701 - 03/18 0700 In: 2975 [P.O.:240; I.V.:2735] Out: 3600 [Urine:1750; Emesis/NG output:1850] Intake/Output this shift: Total I/O In: 0  Out: 300 [Emesis/NG output:300]  General appearance: alert, cooperative, no distress and NG seems to be functioning well GI: Obese, soft, non-tender; + BS  Lab Results:   Recent Labs  03/14/17 2031 03/16/17 0546  WBC 12.8* 7.6  HGB 11.7* 10.0*  HCT 36.5* 32.4*  PLT 274 248   BMET  Recent Labs  03/15/17 0551 03/16/17 0546  NA 132* 135  K 3.8 3.8  CL 96* 100*  CO2 27 30  GLUCOSE 331* 206*  BUN 20 13  CREATININE 1.02 0.92  CALCIUM 8.6* 8.1*   PT/INR No results for input(s): LABPROT, INR in the last 72 hours. ABG No results for input(s): PHART, HCO3 in the last 72 hours.  Invalid input(s): PCO2, PO2  Studies/Results: Ct Abdomen Pelvis W Contrast  Result Date: 03/14/2017 CLINICAL DATA:  Severe umbilical pain radiating to the back with nausea and vomiting EXAM: CT ABDOMEN AND PELVIS WITH CONTRAST TECHNIQUE: Multidetector CT imaging of the abdomen and pelvis was performed using the standard protocol following bolus administration of intravenous contrast. CONTRAST:  100 mL Isovue-300 intravenous COMPARISON:  Bone scan 11/13/2016, CT 11/13/2016, 11/11/2012 FINDINGS: Lower chest: Lung bases demonstrate no acute consolidation or effusion. Heart size within normal limits. Coronary artery calcifications. Hepatobiliary: No focal hepatic abnormality. Multiple calcified gallstones. No biliary  dilatation. Pancreas: Unremarkable. No pancreatic ductal dilatation or surrounding inflammatory changes. Spleen: Normal in size without focal abnormality. Adrenals/Urinary Tract: Adrenal glands are within normal limits. Kidneys show no hydronephrosis. Thick-walled bladder but partially obscured by artifact from surgical hardware are in the hips. Stomach/Bowel: Multiple loops of fluid-filled borderline enlarged small bowel within the abdomen and pelvis with distal decompressed small bowel loops. No colon wall thickening. Prominent appendix tip but contains gas within its lumen and no evidence for inflammation. Scattered sigmoid colon diverticula without acute inflammation. Vascular/Lymphatic: Atherosclerosis of the aorta. Stable 0 right iliac lymph nodes measuring up to 1.5 cm on the right an 1.2 cm on the left. Reproductive: Obscured by artifact from pelvic hardware. No obvious masses. Other: No free air or free fluid.  Fat containing umbilical hernia. Musculoskeletal: Status post bilateral hip replacements with associated artifact. Changes of left hemipelvis Paget's disease as before. Diffuse sclerosis of the right iliac bone and SI joint which may be posttraumatic, also unchanged. IMPRESSION: 1. Negative appendix. 2. Borderline fluid-filled enlarged loops of small bowel within the abdomen and pelvis. Findings could be secondary to an ileus or developing/partial small bowel obstruction. 3. Thick-walled urinary bladder. 4. Stable osseous structures with apparent left hemipelvis Paget's disease. Electronically Signed   By: Donavan Foil M.D.   On: 03/14/2017 23:02   Dg Abd Portable 1 View  Result Date: 03/15/2017 CLINICAL DATA:  NG tube placement EXAM: PORTABLE ABDOMEN - 1 VIEW COMPARISON:  CT abdomen and pelvis 03/14/2017 FINDINGS: Enteric tube is present with tip in the right upper quadrant consistent with location in the distal stomach. Mildly dilated  gas-filled small bowel are demonstrated. IMPRESSION:  Enteric tube tip is in the right upper quadrant consistent with location in the distal stomach. Electronically Signed   By: Lucienne Capers M.D.   On: 03/15/2017 01:20    Anti-infectives: Anti-infectives    Start     Dose/Rate Route Frequency Ordered Stop   03/16/17 1000  amoxicillin-clavulanate (AUGMENTIN) 875-125 MG per tablet 1 tablet     1 tablet Oral Every 12 hours 03/16/17 0855        Assessment/Plan: Ileus vs. pSBO - no history of abdominal surgeries/hernias decreases likelihood of a true SBO requiring operative intervention. - recommend continuation of IVF and bowel rest;  -will initiate small bowel obstruction protocol - Gastrografin, clamp NG, follow-up films - minimize narcotic pain medications as it could contribute to bowel dysmotility  - mobilize as tolerated   LOS: 1 day    Tacari Repass K. 03/16/2017

## 2017-03-16 NOTE — Consult Note (Signed)
[facility] Nurse wound consult note Reason for Consult:Chronic lymphedema to bilateral lower legs.  Has removable compression garment on bilateral lower legs.  Has no open wounds at this time.  Wound type:none Pressure Injury POA: N/A Drainage (amount, consistency, odor) None Removable compression garments to bilateral lower legs.  Patient did not want to remove this AM.  Is feeling nauseated.  Will not follow at this time.  Please re-consult if needed.  Domenic Moras RN BSN [place] Pager [phone]

## 2017-03-17 ENCOUNTER — Inpatient Hospital Stay (HOSPITAL_COMMUNITY): Payer: PPO

## 2017-03-17 LAB — CBC
HCT: 33.2 % — ABNORMAL LOW (ref 39.0–52.0)
HEMOGLOBIN: 10.3 g/dL — AB (ref 13.0–17.0)
MCH: 26.1 pg (ref 26.0–34.0)
MCHC: 31 g/dL (ref 30.0–36.0)
MCV: 84.3 fL (ref 78.0–100.0)
PLATELETS: 250 10*3/uL (ref 150–400)
RBC: 3.94 MIL/uL — AB (ref 4.22–5.81)
RDW: 14.1 % (ref 11.5–15.5)
WBC: 7.1 10*3/uL (ref 4.0–10.5)

## 2017-03-17 LAB — BASIC METABOLIC PANEL
ANION GAP: 10 (ref 5–15)
BUN: 8 mg/dL (ref 6–20)
CHLORIDE: 102 mmol/L (ref 101–111)
CO2: 26 mmol/L (ref 22–32)
Calcium: 8.1 mg/dL — ABNORMAL LOW (ref 8.9–10.3)
Creatinine, Ser: 0.96 mg/dL (ref 0.61–1.24)
Glucose, Bld: 157 mg/dL — ABNORMAL HIGH (ref 65–99)
POTASSIUM: 3.7 mmol/L (ref 3.5–5.1)
SODIUM: 138 mmol/L (ref 135–145)

## 2017-03-17 LAB — GLUCOSE, CAPILLARY
GLUCOSE-CAPILLARY: 132 mg/dL — AB (ref 65–99)
GLUCOSE-CAPILLARY: 143 mg/dL — AB (ref 65–99)
GLUCOSE-CAPILLARY: 158 mg/dL — AB (ref 65–99)
Glucose-Capillary: 155 mg/dL — ABNORMAL HIGH (ref 65–99)
Glucose-Capillary: 107 mg/dL — ABNORMAL HIGH (ref 65–99)
Glucose-Capillary: 148 mg/dL — ABNORMAL HIGH (ref 65–99)

## 2017-03-17 LAB — HEMOGLOBIN A1C
HEMOGLOBIN A1C: 14.3 % — AB (ref 4.8–5.6)
MEAN PLASMA GLUCOSE: 364 mg/dL

## 2017-03-17 NOTE — Care Management Note (Addendum)
Case Management Note  Patient Details  Name: [name] MRN: [id number] Date of Birth: 1941-06-15  Subjective/Objective:                    Action/Plan:  PT recommending home health PT and 24 hour supervision . Discussed with patient and friend at bedside.  Patient voices understanding, however declines home health and states he does not need 24 hour supervision.   Patient has an aide 8 hours a week through [place] , [phone].  Patient would like a hospital bed . He will have some friends remove his bed before delivery.   Paged family medicine pager . Family medicine aware of above. Received order for hospital bed. [facility] aware and will call patient for delivery. Expected Discharge Date:                  Expected Discharge Plan:  Home/Self Care  In-House Referral:     Discharge planning Services  CM Consult  Post Acute Care Choice:  Home Health, Durable Medical Equipment Choice offered to:  Patient  DME Arranged:  Hospital bed DME Agency:  [place]:  Patient Refused [facility] Agency:     Status of Service:  In process, will continue to follow  If discussed at Long Length of Stay Meetings, dates discussed:    Additional Comments:  Marilu Favre, RN 03/17/2017, 2:49 PM
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
## 2017-03-17 NOTE — Progress Notes (Signed)
Patient refusing wound care to bilateral lower extremities and bath at 0500. States " I am comfortable and want to rest. I want to wait until the morning."                                          Mavrick Mcquigg RN

## 2017-03-17 NOTE — Progress Notes (Signed)
[facility] Progress Note     Subjective: Pt having watery BM's. No fever. Pt states NGT came out and was replaced yesterday.   Objective: Vital signs in last 24 hours: Temp:  [98.1 F (36.7 C)-98.2 F (36.8 C)] 98.2 F (36.8 C) (03/19 0347) Pulse Rate:  [77-88] 88 (03/19 0347) Resp:  [18-19] 18 (03/19 0347) BP: (118-119)/(70-75) 118/70 (03/19 0347) SpO2:  [93 %-98 %] 93 % (03/19 0754) Last BM Date: 03/16/17  Intake/Output from previous day: 03/18 0701 - 03/19 0700 In: 3015 [I.V.:3015] Out: 1600 [Urine:800; Emesis/NG output:800] Intake/Output this shift: No intake/output data recorded.  PE: Gen:  Alert, NAD, pleasant, cooperative, well appearing Pulm:  Rate and effort normal Abd: Soft, obese, not distended, +BS, no TTP Skin: no rashes noted, warm and dry  Lab Results:   Recent Labs  03/16/17 0546 03/17/17 0608  WBC 7.6 7.1  HGB 10.0* 10.3*  HCT 32.4* 33.2*  PLT 248 250   BMET  Recent Labs  03/16/17 0546 03/17/17 0608  NA 135 138  K 3.8 3.7  CL 100* 102  CO2 30 26  GLUCOSE 206* 157*  BUN 13 8  CREATININE 0.92 0.96  CALCIUM 8.1* 8.1*   PT/INR No results for input(s): LABPROT, INR in the last 72 hours. CMP     Component Value Date/Time   NA 138 03/17/2017 0608   K 3.7 03/17/2017 0608   CL 102 03/17/2017 0608   CO2 26 03/17/2017 0608   GLUCOSE 157 (H) 03/17/2017 0608   BUN 8 03/17/2017 0608   CREATININE 0.96 03/17/2017 0608   CREATININE 0.99 11/04/2013 1436   CALCIUM 8.1 (L) 03/17/2017 0608   PROT 8.0 03/14/2017 2031   ALBUMIN 2.8 (L) 03/14/2017 2031   AST 13 (L) 03/14/2017 2031   ALT 13 (L) 03/14/2017 2031   ALKPHOS 160 (H) 03/14/2017 2031   BILITOT 0.1 (L) 03/14/2017 2031   GFRNONAA >60 03/17/2017 0608   GFRNONAA 75 11/04/2013 1436   GFRAA >60 03/17/2017 0608   GFRAA 87 11/04/2013 1436   Lipase     Component Value Date/Time   LIPASE 10 (L) 03/14/2017 2031       Studies/Results: Dg Abd Portable 1v-small Bowel  Obstruction Protocol-24 Hr Delay  Result Date: 03/17/2017 CLINICAL DATA:  Small bowel delay EXAM: PORTABLE ABDOMEN - 1 VIEW COMPARISON:  03/16/2017 FINDINGS: Oral contrast material is noted within the ascending colon, distal transverse colon and splenic flexure, and sigmoid colon. No dilated small bowel loops noted. NG tube tip is in the distal stomach. IMPRESSION: Oral contrast material is noted throughout parts of the colon as above. Electronically Signed   By: Rolm Baptise M.D.   On: 03/17/2017 07:11   Dg Abd Portable 1v-small Bowel Obstruction Protocol-initial, 8 Hr Delay  Result Date: 03/16/2017 CLINICAL DATA:  Small bowel obstruction. EXAM: PORTABLE ABDOMEN - 1 VIEW COMPARISON:  03/06/2017 at 1653 hours FINDINGS: Examination is limited by patient body habitus. Enteric tube remains in place terminating over the distal stomach. A small amount of contrast is present in the stomach. A small amount of contrast is also present in the right mid abdomen, most likely in ascending colon, as well as in the pelvis, possibly in sigmoid colon versus small bowel. Gas is present in scattered loops of grossly nondilated small bowel. Gas is also present in the rectum. Bilateral hip arthroplasties are noted. IMPRESSION: Limited examination due to patient body habitus. Contrast in the right mid abdomen and pelvis, possibly in ascending and sigmoid  colon (versus small bowel). Electronically Signed   By: Logan Bores M.D.   On: 03/16/2017 21:57   Dg Abd Portable 1v  Result Date: 03/16/2017 CLINICAL DATA:  Evaluate NG tube placement EXAM: PORTABLE ABDOMEN - 1 VIEW COMPARISON:  March 15, 2017 FINDINGS: The NG tube terminates in the distal stomach. IMPRESSION: The NG tube terminates near the gastric antrum. Electronically Signed   By: Dorise Bullion III M.D   On: 03/16/2017 17:16    Anti-infectives: Anti-infectives    Start     Dose/Rate Route Frequency Ordered Stop   03/16/17 1000  amoxicillin-clavulanate (AUGMENTIN)  875-125 MG per tablet 1 tablet     1 tablet Oral Every 12 hours 03/16/17 0855         Assessment/Plan Ileus vs. pSBO - no history of abdominal surgeries/hernias decreases likelihood of a true SBO requiring operative intervention. - SBO protocol in place and contrast seen in colon in abd films today - Pt having BM's, no abd pain  Will clamp NGT and advance to clears. If he tolerates will pull tube later today and advance diet.   We will continue to follow   LOS: 2 days    Kalman Drape , [facility] 03/17/2017, 11:37 AM Pager: [phone] Consults: [phone] Mon-Fri 7:00 am-4:30 pm Sat-Sun 7:00 am-11:30 am

## 2017-03-17 NOTE — Progress Notes (Signed)
Physical Therapy Treatment Patient Details Name: [name] MRN: [id number] DOB: [birth year] Today's Date: 03/17/2017    History of Present Illness [name] is a 76 year old male with multiple medical problems including, but not limited to, hypertension, hyperlipidemia, diabetes mellitus type 2, COPD, GERD, and Hx prostate adenocarcinoma, who presented to [facility] emergency department on the evening of 3/16 complaining of sharp, waxing and waning, centralized abdominal pain.   Probable ileus.  PMH includes DM, prostate CA, bil hip replacements, chronic venous insuffiency, chronic bil LE wounds.    PT Comments    Pt admitted with above diagnosis. Pt currently with functional limitations due to balance and endurance deficits. Pt was able to ambulate with crutches with min guard assist with good safety overall.  Pt appears close to baseline status but will see pt at least once more to ensure mobility.   Pt will benefit from skilled PT to increase their independence and safety with mobility to allow discharge to the venue listed below.     Follow Up Recommendations  Home health PT;Supervision - Intermittent [facility])     Equipment Recommendations  Other (comment) (hospital bed)    Recommendations for Other Services       Precautions / Restrictions Precautions Precautions: Fall Precaution Comments: NG tube Restrictions Weight Bearing Restrictions: No    Mobility  Bed Mobility               General bed mobility comments: In recliner upon arrival  Transfers Overall transfer level: Needs assistance Equipment used: Crutches Transfers: Sit to/from [facility] Sit to Stand: Min guard Stand pivot transfers: Min guard       General transfer comment: Pt was able to stand with no assist to power up.  Pt used momentum to come to standing.  Once up, able to move crutches to position without assist.  Pt with widened BOS.    Ambulation/Gait Ambulation/Gait  assistance: Min guard;Supervision Ambulation Distance (Feet): 80 Feet Assistive device: Crutches Gait Pattern/deviations: Step-to pattern;Decreased step length - right;Decreased step length - left;Decreased stride length;Trunk flexed   Gait velocity interpretation: Below normal speed for age/gender General Gait Details: Pt uses crutches appropriately for sequencing with occasional cues for not leaning on crutches.  Overall good safety with crutches with pt feeling he is close to baseline.     Stairs            Wheelchair Mobility    Modified Rankin (Stroke Patients Only)       Balance Overall balance assessment: Needs assistance Sitting-balance support: No upper extremity supported;Feet supported Sitting balance-Leahy Scale: Fair     Standing balance support: Bilateral upper extremity supported;During functional activity Standing balance-Leahy Scale: Fair Standing balance comment: Pt able to perform functional transfer with Min guard  and maintains standing balance.             High level balance activites: Backward walking;Direction changes;Turns;Sudden stops High Level Balance Comments: min guard assist with crutches    Cognition Arousal/Alertness: Awake/alert Behavior During Therapy: WFL for tasks assessed/performed Overall Cognitive Status: Within Functional Limits for tasks assessed                      Exercises General Exercises - Lower Extremity Ankle Circles/Pumps: AROM;Both;10 reps;Supine Quad Sets: AROM;Both;10 reps;Seated Long Arc Quad: AROM;Both;10 reps;Seated Hip Flexion/Marching: AROM;Both;10 reps;Seated    General Comments        Pertinent Vitals/Pain Pain Assessment: No/denies pain  VSS  Home Living  Prior Function            PT Goals (current goals can now be found in the care plan section) Acute Rehab PT Goals Patient Stated Goal: Go home Progress towards PT goals: Progressing toward  goals    Frequency    Min 3X/week      PT Plan Current plan remains appropriate    Co-evaluation             End of Session Equipment Utilized During Treatment: Gait belt Activity Tolerance: Patient tolerated treatment well Patient left: in chair;with call bell/phone within reach Nurse Communication: Mobility status PT Visit Diagnosis: Unsteadiness on feet (R26.81);Other abnormalities of gait and mobility (R26.89)     Time: 8101-7510 PT Time Calculation (min) (ACUTE ONLY): 16 min  Charges:  $Gait Training: 8-22 mins                    G Codes:       [name] [name] [name] Mar 25, 2017, 1:48 PM [facility] Acute Rehabilitation [phone] (pager)

## 2017-03-17 NOTE — Progress Notes (Signed)
Family Medicine Teaching Service Daily Progress Note Intern Pager: [phone]  Patient name: [name] Medical record number: [id number] Date of birth: 1941/01/17 Age: 76 y.o. Gender: male  Primary Care Provider: Lockie Pares, MD Consultants: Surgery Code Status: FULL  Pt Overview and Major Events to Date:  3/17: Patient admitted for partial SBO, NG tube placed    Assessment and Plan: [name] a 76 y.o.malepresenting with abdominal pain, found to have partial SBO. PMH is significant for HTN, T2DM, HLD, morbid obesity, hx prostate adenocarcinoma, chronic severe lower extremity lymphedema, hx chronic osteomyelitis of right tibia.   Partial SBO vs Ileus: Surgery does not believe to be complete bowel obstruction; more likely ileus. Vitals continue to remain stable. Has had good output from NG tube (800 mL).  - Surgery consulted, appreciate recommendations - Gastrografin, clamp NG, follow-up films - NG tube clamping trial 3/18 unsuccessful.  Will reattempt today and advance diet to clears as tolerated.  - NS @ 150cc/hour until tolerating regular diet  - Zofran for nausea - PT/OT consult - recc HHPT   Dysuria with UTI: Chronic. Pt endorses dysuria a few times per week. UA with large LE. Now also endorsing suprapubic pain with urination. Afebrile.  UOP stable at 1.7L. Urine culture with >100k Proteus Mirabilis. May be related to osmotic diuresis in the setting of poorly controlled diabetes; however, given symptoms and urine culture findings will treat for UTI.  Patient voided 100 cc urine yesterday.  Bladder scanned and no urine visible.  - Day 2 Augmentin per tube (3/18 >)  - Urine sensitivities pending - Monitor urine output   AKI, Resolved: Cr 0.9.  - continue NS @150  cc.hour until tolerating diet  - Hold Lasix - Trend BMET  Sinus Tachycardia, Resolved: Likely elevated due to placement of NG. Now NSR.  - Convert home Lopressor 100mg  PO bid to Lopressor 5mg  IV q6hrs.   - IVFs at 131ml/hr - Monitor  Normocytic Anemia: stable. Hgb 10.0, MCV 84. Baseline 10-12. - Trend CBCs  HTN: Stable. Takes Lopressor 100mg  bid, Triamterene-HCTZ 37.5-25mg  at home.  - Holding home Lopressor PO and Triamterene-HCTZ while NPO - Lopressor 5mg  IV q6hrs scheduled and Hydralazine IV prn  T2DM: Last A1c was 11.8% on 10/25/16. Takes Levemir 60 units bid and Novolog 45 units bid at home. CBG 190-300.  Repeat A1c on this admission 14.3%.  -elected not to resume Levemir while NPO because high CBGs present at admission now well controlled- 155, 132.  -Continue rSSI coverage only for now  HLD: Last lipid panel in 2015 with Chol 131, HDL 39, LDL 83, TG 45. - Holding home Pravastatin while NPO  COPD: stable. On Albuterol prn and Flovent 1 puff bid at home. Lungs clear. - Continue home Albuterol prn - Pulmicort neb while hospitalized  Hx Prostate Cancer: Diagnosed 6-7 years ago. Not currently undergoing treatment.  - Holding home Flomax while NPO - Follow-up as outpatient  Chronic Severe Lower Extremity Lymphedema:  - Holding home Lasix with AKI - Wound consult - Continue home Silvadene cream  Hx Chronic Osteomyelitis of Right Tibia: Used to follow with Dr. Sharol Given. Takes Tramadol at home. - Holding home Tramadol while NPO   GERD: stable - Holding home Nexium while NPO  ?Paget's Disease: CT abdomen and pelvis showed left hemipelvis Paget's disease. Has been noted on previous bone scans and abdominal CTs. - Follow-up as outpatient  FEN/GI: NPO, NS at 152ml/hr Prophylaxis: Lovenox  Disposition: Pending surgery recs.  NG tube clamping  trial today and ADAT.   Subjective:  Patient feels well this morning. Denies nausea, vomiting. Denies abdominal pain.    Objective: Temp:  [98.1 F (36.7 C)-98.2 F (36.8 C)] 98.2 F (36.8 C) (03/19 0347) Pulse Rate:  [77-88] 88 (03/19 0347) Resp:  [18-19] 18 (03/19 0347) BP: (118-119)/(70-75) 118/70 (03/19 0347) SpO2:  [93  %-98 %] 93 % (03/19 0754)   Physical Exam: General: Well-appearing, in NAD sitting in chair  HEENT: [place]/AT, NG tube in place Cardiovascular: RRR, II/VI systolic murmur heard loudest in the LUSB Respiratory: CTA B/L, normal work of breathing  Gastrointestinal: soft, no rebound, no guarding, +bs MSK: Edema L > R, no calf tenderness, warm and well-perfused Derm: chronic venous stasis changes present in the LE. Legs wrapped in dressing.  Laboratory:  Recent Labs Lab 03/14/17 2031 03/16/17 0546 03/17/17 0608  WBC 12.8* 7.6 7.1  HGB 11.7* 10.0* 10.3*  HCT 36.5* 32.4* 33.2*  PLT 274 248 250    Recent Labs Lab 03/14/17 2031 03/15/17 0551 03/16/17 0546 03/17/17 0608  NA 130* 132* 135 138  K 4.6 3.8 3.8 3.7  CL 91* 96* 100* 102  CO2 27 27 30 26   BUN 28* 20 13 8   CREATININE 1.33* 1.02 0.92 0.96  CALCIUM 9.0 8.6* 8.1* 8.1*  PROT 8.0  --   --   --   BILITOT 0.1*  --   --   --   ALKPHOS 160*  --   --   --   ALT 13*  --   --   --   AST 13*  --   --   --   GLUCOSE 493* 331* 206* 157*    Imaging/Diagnostic Tests: Ct Abdomen Pelvis W Contrast  Result Date: 03/14/2017 CLINICAL DATA:  Severe umbilical pain radiating to the back with nausea and vomiting EXAM: CT ABDOMEN AND PELVIS WITH CONTRAST TECHNIQUE: Multidetector CT imaging of the abdomen and pelvis was performed using the standard protocol following bolus administration of intravenous contrast. CONTRAST:  100 mL Isovue-300 intravenous COMPARISON:  Bone scan 11/13/2016, CT 11/13/2016, 11/11/2012 FINDINGS: Lower chest: Lung bases demonstrate no acute consolidation or effusion. Heart size within normal limits. Coronary artery calcifications. Hepatobiliary: No focal hepatic abnormality. Multiple calcified gallstones. No biliary dilatation. Pancreas: Unremarkable. No pancreatic ductal dilatation or surrounding inflammatory changes. Spleen: Normal in size without focal abnormality. Adrenals/Urinary Tract: Adrenal glands are within normal  limits. Kidneys show no hydronephrosis. Thick-walled bladder but partially obscured by artifact from surgical hardware are in the hips. Stomach/Bowel: Multiple loops of fluid-filled borderline enlarged small bowel within the abdomen and pelvis with distal decompressed small bowel loops. No colon wall thickening. Prominent appendix tip but contains gas within its lumen and no evidence for inflammation. Scattered sigmoid colon diverticula without acute inflammation. Vascular/Lymphatic: Atherosclerosis of the aorta. Stable 0 right iliac lymph nodes measuring up to 1.5 cm on the right an 1.2 cm on the left. Reproductive: Obscured by artifact from pelvic hardware. No obvious masses. Other: No free air or free fluid.  Fat containing umbilical hernia. Musculoskeletal: Status post bilateral hip replacements with associated artifact. Changes of left hemipelvis Paget's disease as before. Diffuse sclerosis of the right iliac bone and SI joint which may be posttraumatic, also unchanged. IMPRESSION: 1. Negative appendix. 2. Borderline fluid-filled enlarged loops of small bowel within the abdomen and pelvis. Findings could be secondary to an ileus or developing/partial small bowel obstruction. 3. Thick-walled urinary bladder. 4. Stable osseous structures with apparent left hemipelvis Paget's disease. Electronically  Signed   By: Donavan Foil M.D.   On: 03/14/2017 23:02   Dg Abd Portable 1v-small Bowel Obstruction Protocol-24 Hr Delay  Result Date: 03/17/2017 CLINICAL DATA:  Small bowel delay EXAM: PORTABLE ABDOMEN - 1 VIEW COMPARISON:  03/16/2017 FINDINGS: Oral contrast material is noted within the ascending colon, distal transverse colon and splenic flexure, and sigmoid colon. No dilated small bowel loops noted. NG tube tip is in the distal stomach. IMPRESSION: Oral contrast material is noted throughout parts of the colon as above. Electronically Signed   By: Rolm Baptise M.D.   On: 03/17/2017 07:11   Dg Abd Portable  1v-small Bowel Obstruction Protocol-initial, 8 Hr Delay  Result Date: 03/16/2017 CLINICAL DATA:  Small bowel obstruction. EXAM: PORTABLE ABDOMEN - 1 VIEW COMPARISON:  03/06/2017 at 1653 hours FINDINGS: Examination is limited by patient body habitus. Enteric tube remains in place terminating over the distal stomach. A small amount of contrast is present in the stomach. A small amount of contrast is also present in the right mid abdomen, most likely in ascending colon, as well as in the pelvis, possibly in sigmoid colon versus small bowel. Gas is present in scattered loops of grossly nondilated small bowel. Gas is also present in the rectum. Bilateral hip arthroplasties are noted. IMPRESSION: Limited examination due to patient body habitus. Contrast in the right mid abdomen and pelvis, possibly in ascending and sigmoid colon (versus small bowel). Electronically Signed   By: Logan Bores M.D.   On: 03/16/2017 21:57   Dg Abd Portable 1v  Result Date: 03/16/2017 CLINICAL DATA:  Evaluate NG tube placement EXAM: PORTABLE ABDOMEN - 1 VIEW COMPARISON:  March 15, 2017 FINDINGS: The NG tube terminates in the distal stomach. IMPRESSION: The NG tube terminates near the gastric antrum. Electronically Signed   By: Dorise Bullion III M.D   On: 03/16/2017 17:16   Dg Abd Portable 1 View  Result Date: 03/15/2017 CLINICAL DATA:  NG tube placement EXAM: PORTABLE ABDOMEN - 1 VIEW COMPARISON:  CT abdomen and pelvis 03/14/2017 FINDINGS: Enteric tube is present with tip in the right upper quadrant consistent with location in the distal stomach. Mildly dilated gas-filled small bowel are demonstrated. IMPRESSION: Enteric tube tip is in the right upper quadrant consistent with location in the distal stomach. Electronically Signed   By: Lucienne Capers M.D.   On: 03/15/2017 01:20    Lovenia Kim, MD 03/17/2017, 1:33 PM PGY-1, [place] Intern pager: [phone], text pages welcome

## 2017-03-18 ENCOUNTER — Encounter (HOSPITAL_COMMUNITY): Payer: Self-pay | Admitting: General Practice

## 2017-03-18 LAB — BASIC METABOLIC PANEL
ANION GAP: 7 (ref 5–15)
BUN: <5 mg/dL — ABNORMAL LOW (ref 6–20)
CALCIUM: 8.4 mg/dL — AB (ref 8.9–10.3)
CO2: 28 mmol/L (ref 22–32)
Chloride: 103 mmol/L (ref 101–111)
Creatinine, Ser: 0.8 mg/dL (ref 0.61–1.24)
Glucose, Bld: 110 mg/dL — ABNORMAL HIGH (ref 65–99)
Potassium: 3.6 mmol/L (ref 3.5–5.1)
SODIUM: 138 mmol/L (ref 135–145)

## 2017-03-18 LAB — URINE CULTURE

## 2017-03-18 LAB — CBC
HEMATOCRIT: 33.4 % — AB (ref 39.0–52.0)
HEMOGLOBIN: 10.3 g/dL — AB (ref 13.0–17.0)
MCH: 26.1 pg (ref 26.0–34.0)
MCHC: 30.8 g/dL (ref 30.0–36.0)
MCV: 84.6 fL (ref 78.0–100.0)
Platelets: 264 10*3/uL (ref 150–400)
RBC: 3.95 MIL/uL — ABNORMAL LOW (ref 4.22–5.81)
RDW: 14.2 % (ref 11.5–15.5)
WBC: 7.3 10*3/uL (ref 4.0–10.5)

## 2017-03-18 LAB — GLUCOSE, CAPILLARY
GLUCOSE-CAPILLARY: 107 mg/dL — AB (ref 65–99)
GLUCOSE-CAPILLARY: 140 mg/dL — AB (ref 65–99)
GLUCOSE-CAPILLARY: 182 mg/dL — AB (ref 65–99)
Glucose-Capillary: 126 mg/dL — ABNORMAL HIGH (ref 65–99)
Glucose-Capillary: 171 mg/dL — ABNORMAL HIGH (ref 65–99)
Glucose-Capillary: 214 mg/dL — ABNORMAL HIGH (ref 65–99)

## 2017-03-18 MED ORDER — PHENOL 1.4 % MT LIQD
1.0000 | OROMUCOSAL | Status: DC | PRN
  Filled 2017-03-18: qty 177

## 2017-03-18 MED ORDER — TRIAMTERENE-HCTZ 37.5-25 MG PO TABS
1.0000 | ORAL_TABLET | Freq: Every day | ORAL | Status: DC
  Administered 2017-03-18 – 2017-03-19 (×2): 1 via ORAL
  Filled 2017-03-18 (×2): qty 1

## 2017-03-18 MED ORDER — CALCIUM CARBONATE ANTACID 500 MG PO CHEW
1.0000 | CHEWABLE_TABLET | Freq: Four times a day (QID) | ORAL | Status: DC | PRN
  Administered 2017-03-18: 200 mg via ORAL
  Filled 2017-03-18: qty 1

## 2017-03-18 MED ORDER — PANTOPRAZOLE SODIUM 40 MG PO TBEC
40.0000 mg | DELAYED_RELEASE_TABLET | Freq: Two times a day (BID) | ORAL | Status: DC
  Administered 2017-03-18 – 2017-03-19 (×3): 40 mg via ORAL
  Filled 2017-03-18 (×3): qty 1

## 2017-03-18 MED ORDER — METOPROLOL TARTRATE 100 MG PO TABS
100.0000 mg | ORAL_TABLET | Freq: Two times a day (BID) | ORAL | Status: DC
Start: 2017-03-18 — End: 2017-03-19
  Administered 2017-03-18 (×2): 100 mg via ORAL
  Filled 2017-03-18 (×2): qty 1

## 2017-03-18 NOTE — Progress Notes (Signed)
Family Medicine Teaching Service Daily Progress Note Intern Pager: [phone]  Patient name: [name] Medical record number: [id number] Date of birth: [birth year] Age: 76 y.o. Gender: male  Primary Care Provider: Lockie Pares, MD Consultants: Surgery Code Status: FULL  Pt Overview and Major Events to Date:  3/17: Patient admitted for partial SBO, NG tube placed    Assessment and Plan: PACEY ALTIZER a 76 y.o.malepresenting with abdominal pain, found to have partial SBO. PMH is significant for HTN, T2DM, HLD, morbid obesity, hx prostate adenocarcinoma, chronic severe lower extremity lymphedema, hx chronic osteomyelitis of right tibia.   Partial SBO vs Ileus:  Vitals continue to remain stable. NG tube clamped 3/19.  Patient tolerated half cup of clear liquids without nausea/vomiting and has had multiple bowel movements.  Denies abdominal pain. NG tube is out and diet advanced to clears.  - Surgery following, appreciate recommendations  -Clear liquid diet  -Stopped IVFs this am as he is tolerating diet  - Zofran for nausea - PT/OT consult - recc HHPT, hospital bed recommended.  However patient refusing home health because he has an aide who helps 8 hours a week.   DME for hospital bed placed.    Dysuria with UTI:  Chronic.  Pt endorses dysuria a few times per week. UA with large LE.  Now also endorsing suprapubic pain with urination.  Afebrile.  UOP stable at 1.7L. Urine culture with >100k Proteus Mirabilis. May be related to osmotic diuresis in the setting of poorly controlled diabetes; however, given symptoms and urine culture findings will treat for UTI.  Patient voided 100 cc urine yesterday.  Bladder scanned and no urine visible.  - Day 3 Augmentin per tube (3/18 >)  - Urine sensitivities pending - Monitor urine output   AKI, Resolved: Cr 0.9.  - continue NS @150  cc.hour until tolerating diet  - Hold Lasix - Trend BMET  Sinus Tachycardia, Resolved:  - Restarted home  Lopressor 100mg  po bid  - Monitor  Normocytic Anemia: stable. Hgb 10.0, MCV 84. Baseline 10-12. - Trend CBCs  HTN: Stable. Takes Lopressor 100mg  bid, Triamterene-HCTZ 37.5-25mg  at home.  -Triamterene-HCTZ was held on admission while NPO but can now resume in setting of elevated BPs.   - Resume home Lopressor -IV Hydral prn  -monitor BPs  T2DM: Last A1c was 11.8% on 10/25/16. Takes Levemir 60 units bid and Novolog 45 units bid at home. CBG 190-300.  Repeat A1c on this admission 14.3%.  -elected not to resume Levemir while NPO because high CBGs present at admission now well controlled- 140, 107, 126.  -Continue rSSI coverage only for now  HLD: Last lipid panel in 2015 with Chol 131, HDL 39, LDL 83, TG 45. - Holding home Pravastatin while NPO  COPD: stable. On Albuterol prn and Flovent 1 puff bid at home. Lungs clear. - Continue home Albuterol prn - Pulmicort neb while hospitalized  Hx Prostate Cancer: Diagnosed 6-7 years ago. Not currently undergoing treatment.  - Holding home Flomax while NPO - Follow-up as outpatient  Chronic Severe Lower Extremity Lymphedema:  - Holding home Lasix with AKI - Wound consult - Continue home Silvadene cream  Hx Chronic Osteomyelitis of Right Tibia: Used to follow with Dr. Sharol Given. Takes Tramadol at home. - Holding home Tramadol while NPO    GERD: stable - Holding home Nexium while NPO  ?Paget's Disease: CT abdomen and pelvis showed left hemipelvis Paget's disease. Has been noted on previous bone scans and abdominal CTs. - Follow-up  as outpatient  FEN/GI: clears, NS at 169ml/hr Prophylaxis: Lovenox  Disposition: Pending surgery recs. Patient to advance diet if tolerates.   Subjective:  Patient feels well this morning because his NG tube is out. Denies nausea, vomiting. Denies abdominal pain.  Has has several bowel movements.   Objective: Temp:  [98.2 F (36.8 C)-98.5 F (36.9 C)] 98.2 F (36.8 C) (03/20 1100) Pulse Rate:   [52-73] 68 (03/20 0404) Resp:  [17-18] 18 (03/20 0404) BP: (124-154)/(51-66) 124/66 (03/20 1100) SpO2:  [92 %-100 %] 96 % (03/20 1100)   Physical Exam: General: Well-appearing, in NAD sitting in chair  HEENT: [place]/AT, EOMI, MMM  Cardiovascular: RRR, II/VI systolic murmur heard loudest in the LUSB Respiratory: CTA B/L, normal work of breathing  Gastrointestinal: soft, no rebound, no guarding, +bs MSK: Edema L > R, no calf tenderness, warm and well-perfused Derm: chronic venous stasis changes present in the LE  Neuro: AAOx3, no focal deficits  Psych: mood and affect appropriate   Laboratory:  Recent Labs Lab 03/16/17 0546 03/17/17 0608 03/18/17 0413  WBC 7.6 7.1 7.3  HGB 10.0* 10.3* 10.3*  HCT 32.4* 33.2* 33.4*  PLT 248 250 264    Recent Labs Lab 03/14/17 2031  03/16/17 0546 03/17/17 0608 03/18/17 0413  NA 130*  < > 135 138 138  K 4.6  < > 3.8 3.7 3.6  CL 91*  < > 100* 102 103  CO2 27  < > 30 26 28   BUN 28*  < > 13 8 <5*  CREATININE 1.33*  < > 0.92 0.96 0.80  CALCIUM 9.0  < > 8.1* 8.1* 8.4*  PROT 8.0  --   --   --   --   BILITOT 0.1*  --   --   --   --   ALKPHOS 160*  --   --   --   --   ALT 13*  --   --   --   --   AST 13*  --   --   --   --   GLUCOSE 493*  < > 206* 157* 110*  < > = values in this interval not displayed.  Imaging/Diagnostic Tests: Ct Abdomen Pelvis W Contrast  Result Date: 03/14/2017 CLINICAL DATA:  Severe umbilical pain radiating to the back with nausea and vomiting EXAM: CT ABDOMEN AND PELVIS WITH CONTRAST TECHNIQUE: Multidetector CT imaging of the abdomen and pelvis was performed using the standard protocol following bolus administration of intravenous contrast. CONTRAST:  100 mL Isovue-300 intravenous COMPARISON:  Bone scan 11/13/2016, CT 11/13/2016, 11/11/2012 FINDINGS: Lower chest: Lung bases demonstrate no acute consolidation or effusion. Heart size within normal limits. Coronary artery calcifications. Hepatobiliary: No focal hepatic  abnormality. Multiple calcified gallstones. No biliary dilatation. Pancreas: Unremarkable. No pancreatic ductal dilatation or surrounding inflammatory changes. Spleen: Normal in size without focal abnormality. Adrenals/Urinary Tract: Adrenal glands are within normal limits. Kidneys show no hydronephrosis. Thick-walled bladder but partially obscured by artifact from surgical hardware are in the hips. Stomach/Bowel: Multiple loops of fluid-filled borderline enlarged small bowel within the abdomen and pelvis with distal decompressed small bowel loops. No colon wall thickening. Prominent appendix tip but contains gas within its lumen and no evidence for inflammation. Scattered sigmoid colon diverticula without acute inflammation. Vascular/Lymphatic: Atherosclerosis of the aorta. Stable 0 right iliac lymph nodes measuring up to 1.5 cm on the right an 1.2 cm on the left. Reproductive: Obscured by artifact from pelvic hardware. No obvious masses. Other: No free air or  free fluid.  Fat containing umbilical hernia. Musculoskeletal: Status post bilateral hip replacements with associated artifact. Changes of left hemipelvis Paget's disease as before. Diffuse sclerosis of the right iliac bone and SI joint which may be posttraumatic, also unchanged. IMPRESSION: 1. Negative appendix. 2. Borderline fluid-filled enlarged loops of small bowel within the abdomen and pelvis. Findings could be secondary to an ileus or developing/partial small bowel obstruction. 3. Thick-walled urinary bladder. 4. Stable osseous structures with apparent left hemipelvis Paget's disease. Electronically Signed   By: Donavan Foil M.D.   On: 03/14/2017 23:02   Dg Abd Portable 1v-small Bowel Obstruction Protocol-24 Hr Delay  Result Date: 03/17/2017 CLINICAL DATA:  Small bowel delay EXAM: PORTABLE ABDOMEN - 1 VIEW COMPARISON:  03/16/2017 FINDINGS: Oral contrast material is noted within the ascending colon, distal transverse colon and splenic flexure, and  sigmoid colon. No dilated small bowel loops noted. NG tube tip is in the distal stomach. IMPRESSION: Oral contrast material is noted throughout parts of the colon as above. Electronically Signed   By: Rolm Baptise M.D.   On: 03/17/2017 07:11   Dg Abd Portable 1v-small Bowel Obstruction Protocol-initial, 8 Hr Delay  Result Date: 03/16/2017 CLINICAL DATA:  Small bowel obstruction. EXAM: PORTABLE ABDOMEN - 1 VIEW COMPARISON:  03/06/2017 at 1653 hours FINDINGS: Examination is limited by patient body habitus. Enteric tube remains in place terminating over the distal stomach. A small amount of contrast is present in the stomach. A small amount of contrast is also present in the right mid abdomen, most likely in ascending colon, as well as in the pelvis, possibly in sigmoid colon versus small bowel. Gas is present in scattered loops of grossly nondilated small bowel. Gas is also present in the rectum. Bilateral hip arthroplasties are noted. IMPRESSION: Limited examination due to patient body habitus. Contrast in the right mid abdomen and pelvis, possibly in ascending and sigmoid colon (versus small bowel). Electronically Signed   By: Logan Bores M.D.   On: 03/16/2017 21:57   Dg Abd Portable 1v  Result Date: 03/16/2017 CLINICAL DATA:  Evaluate NG tube placement EXAM: PORTABLE ABDOMEN - 1 VIEW COMPARISON:  March 15, 2017 FINDINGS: The NG tube terminates in the distal stomach. IMPRESSION: The NG tube terminates near the gastric antrum. Electronically Signed   By: Dorise Bullion III M.D   On: 03/16/2017 17:16   Dg Abd Portable 1 View  Result Date: 03/15/2017 CLINICAL DATA:  NG tube placement EXAM: PORTABLE ABDOMEN - 1 VIEW COMPARISON:  CT abdomen and pelvis 03/14/2017 FINDINGS: Enteric tube is present with tip in the right upper quadrant consistent with location in the distal stomach. Mildly dilated gas-filled small bowel are demonstrated. IMPRESSION: Enteric tube tip is in the right upper quadrant consistent  with location in the distal stomach. Electronically Signed   By: Lucienne Capers M.D.   On: 03/15/2017 01:20    Lovenia Kim, MD 03/18/2017, 12:52 PM PGY-1, [place] Intern pager: [phone], text pages welcome

## 2017-03-18 NOTE — Progress Notes (Signed)
[facility] Progress Note     Subjective: Pt had some nausea and c/o acid reflux. Pt intermittently takes nexium. Has not taken in sometime. C/o epigastric pain. Tolerated clamped NGT and clears. Had a water BM last night. + flatus. No vomiting.  Objective: Vital signs in last 24 hours: Temp:  [98.3 F (36.8 C)-98.5 F (36.9 C)] 98.3 F (36.8 C) (03/20 0404) Pulse Rate:  [52-73] 68 (03/20 0404) Resp:  [17-18] 18 (03/20 0404) BP: (134-154)/(51-55) 148/55 (03/20 0404) SpO2:  [92 %-100 %] 92 % (03/20 0833) Last BM Date: 03/17/17  Intake/Output from previous day: 03/19 0701 - 03/20 0700 In: 3297.5 [P.O.:240; I.V.:3057.5] Out: 1275 [Urine:875; Emesis/NG output:400] Intake/Output this shift: Total I/O In: 405 [I.V.:405] Out: 300 [Urine:300]  PE: Gen:  Alert, NAD, pleasant, cooperative, well appearing Pulm:  Rate and effort normal Abd: Soft, obese, not distended, +BS, TTP in epigastric region Skin: no rashes noted, warm and dry  Lab Results:   Recent Labs  03/17/17 0608 03/18/17 0413  WBC 7.1 7.3  HGB 10.3* 10.3*  HCT 33.2* 33.4*  PLT 250 264   BMET  Recent Labs  03/17/17 0608 03/18/17 0413  NA 138 138  K 3.7 3.6  CL 102 103  CO2 26 28  GLUCOSE 157* 110*  BUN 8 <5*  CREATININE 0.96 0.80  CALCIUM 8.1* 8.4*   PT/INR No results for input(s): LABPROT, INR in the last 72 hours. CMP     Component Value Date/Time   NA 138 03/18/2017 0413   K 3.6 03/18/2017 0413   CL 103 03/18/2017 0413   CO2 28 03/18/2017 0413   GLUCOSE 110 (H) 03/18/2017 0413   BUN <5 (L) 03/18/2017 0413   CREATININE 0.80 03/18/2017 0413   CREATININE 0.99 11/04/2013 1436   CALCIUM 8.4 (L) 03/18/2017 0413   PROT 8.0 03/14/2017 2031   ALBUMIN 2.8 (L) 03/14/2017 2031   AST 13 (L) 03/14/2017 2031   ALT 13 (L) 03/14/2017 2031   ALKPHOS 160 (H) 03/14/2017 2031   BILITOT 0.1 (L) 03/14/2017 2031   GFRNONAA >60 03/18/2017 0413   GFRNONAA 75 11/04/2013 1436   GFRAA >60  03/18/2017 0413   GFRAA 87 11/04/2013 1436   Lipase     Component Value Date/Time   LIPASE 10 (L) 03/14/2017 2031       Studies/Results: Dg Abd Portable 1v-small Bowel Obstruction Protocol-24 Hr Delay  Result Date: 03/17/2017 CLINICAL DATA:  Small bowel delay EXAM: PORTABLE ABDOMEN - 1 VIEW COMPARISON:  03/16/2017 FINDINGS: Oral contrast material is noted within the ascending colon, distal transverse colon and splenic flexure, and sigmoid colon. No dilated small bowel loops noted. NG tube tip is in the distal stomach. IMPRESSION: Oral contrast material is noted throughout parts of the colon as above. Electronically Signed   By: Rolm Baptise M.D.   On: 03/17/2017 07:11   Dg Abd Portable 1v-small Bowel Obstruction Protocol-initial, 8 Hr Delay  Result Date: 03/16/2017 CLINICAL DATA:  Small bowel obstruction. EXAM: PORTABLE ABDOMEN - 1 VIEW COMPARISON:  03/06/2017 at 1653 hours FINDINGS: Examination is limited by patient body habitus. Enteric tube remains in place terminating over the distal stomach. A small amount of contrast is present in the stomach. A small amount of contrast is also present in the right mid abdomen, most likely in ascending colon, as well as in the pelvis, possibly in sigmoid colon versus small bowel. Gas is present in scattered loops of grossly nondilated small bowel. Gas is also present in the  rectum. Bilateral hip arthroplasties are noted. IMPRESSION: Limited examination due to patient body habitus. Contrast in the right mid abdomen and pelvis, possibly in ascending and sigmoid colon (versus small bowel). Electronically Signed   By: Logan Bores M.D.   On: 03/16/2017 21:57   Dg Abd Portable 1v  Result Date: 03/16/2017 CLINICAL DATA:  Evaluate NG tube placement EXAM: PORTABLE ABDOMEN - 1 VIEW COMPARISON:  March 15, 2017 FINDINGS: The NG tube terminates in the distal stomach. IMPRESSION: The NG tube terminates near the gastric antrum. Electronically Signed   By: Dorise Bullion III M.D   On: 03/16/2017 17:16    Anti-infectives: Anti-infectives    Start     Dose/Rate Route Frequency Ordered Stop   03/16/17 1000  amoxicillin-clavulanate (AUGMENTIN) 875-125 MG per tablet 1 tablet     1 tablet Oral Every 12 hours 03/16/17 0855         Assessment/Plan Ileus vs. pSBO - no history of abdominal surgeries/hernias decreases likelihood of a true SBO requiring operative intervention. - SBO protocol in place and contrast seen in colon in abd films yesterday - Pt having BM's, no abd pain, + flatus, tolerated clamped NGT and clears  Acid reflux - protonix BID and tums PRN  Will DC NG tube and leave on clears. If he tolerates clears can advance to fulls for lunch.   We will continue to follow   LOS: 3 days    Kalman Drape , [facility] 03/18/2017, 8:54 AM Pager: [phone] Consults: [phone] Mon-Fri 7:00 am-4:30 pm Sat-Sun 7:00 am-11:30 am

## 2017-03-18 NOTE — Progress Notes (Signed)
Bilateral lower extremities washed and ointments applied. Legs then wrapped with kerlex and ace wrap per patient request.

## 2017-03-19 ENCOUNTER — Telehealth: Payer: Self-pay | Admitting: Internal Medicine

## 2017-03-19 LAB — BASIC METABOLIC PANEL
ANION GAP: 7 (ref 5–15)
BUN: 5 mg/dL — ABNORMAL LOW (ref 6–20)
CHLORIDE: 102 mmol/L (ref 101–111)
CO2: 28 mmol/L (ref 22–32)
CREATININE: 0.9 mg/dL (ref 0.61–1.24)
Calcium: 8.2 mg/dL — ABNORMAL LOW (ref 8.9–10.3)
GFR calc non Af Amer: >60 mL/min (ref >60)
Glucose, Bld: 126 mg/dL — ABNORMAL HIGH (ref 65–99)
POTASSIUM: 3.5 mmol/L (ref 3.5–5.1)
SODIUM: 137 mmol/L (ref 135–145)

## 2017-03-19 LAB — GLUCOSE, CAPILLARY
GLUCOSE-CAPILLARY: 125 mg/dL — AB (ref 65–99)
Glucose-Capillary: 109 mg/dL — ABNORMAL HIGH (ref 65–99)
Glucose-Capillary: 115 mg/dL — ABNORMAL HIGH (ref 65–99)
Glucose-Capillary: 186 mg/dL — ABNORMAL HIGH (ref 65–99)
Glucose-Capillary: 195 mg/dL — ABNORMAL HIGH (ref 65–99)

## 2017-03-19 LAB — CBC
HEMATOCRIT: 30.7 % — AB (ref 39.0–52.0)
HEMOGLOBIN: 9.5 g/dL — AB (ref 13.0–17.0)
MCH: 26 pg (ref 26.0–34.0)
MCHC: 30.9 g/dL (ref 30.0–36.0)
MCV: 83.9 fL (ref 78.0–100.0)
Platelets: 273 10*3/uL (ref 150–400)
RBC: 3.66 MIL/uL — AB (ref 4.22–5.81)
RDW: 13.8 % (ref 11.5–15.5)
WBC: 7.9 10*3/uL (ref 4.0–10.5)

## 2017-03-19 MED ORDER — CEPHALEXIN 500 MG PO CAPS
500.0000 mg | ORAL_CAPSULE | Freq: Two times a day (BID) | ORAL | 0 refills | Status: AC
Start: 2017-03-19 — End: 2017-03-25

## 2017-03-19 MED ORDER — CEPHALEXIN 500 MG PO CAPS
500.0000 mg | ORAL_CAPSULE | Freq: Two times a day (BID) | ORAL | Status: DC
  Administered 2017-03-19: 500 mg via ORAL
  Filled 2017-03-19: qty 1

## 2017-03-19 MED ORDER — METOPROLOL TARTRATE 50 MG PO TABS: 50.0000 mg | ORAL_TABLET | Freq: Two times a day (BID) | ORAL | Status: DC

## 2017-03-19 MED ORDER — METOPROLOL TARTRATE 100 MG PO TABS: 50.0000 mg | ORAL_TABLET | Freq: Two times a day (BID) | ORAL | 0 refills | Status: DC

## 2017-03-19 NOTE — Progress Notes (Signed)
[facility] Discharge instructions given, pt verbalized understanding. [facility]  is here to transport pt to home, but still waiting if they need a bigger stretcher for the pt.

## 2017-03-19 NOTE — Telephone Encounter (Signed)
Called patient and was able to reach him. He states that he is doing well, feels a lot better since his initial admission. Looking for to see me in clinic. Denies having any issues during this hospitalization.

## 2017-03-19 NOTE — Progress Notes (Signed)
Transitions of Care Pharmacy Note  Plan:  Educated on DM care, insulin administration, s/sx hypoglycemia, keflex dosing and ADE Addressed concerns regarding possible causes of infection Educated to take last dose of lasix no later than 4PM Recommend following up improvement in nocturia  --------------------------------------------- [name] is an 76 y.o. male who presents with a chief complaint UTI. In anticipation of discharge, pharmacy has reviewed this patient's prior to admission medication history, as well as current inpatient medications listed per the [facility].  Current medication indications, dosing, frequency, and notable side effects reviewed with patient. patient verbalized understanding of current inpatient medication regimen and is aware that the After Visit Summary when presented, will represent the most accurate medication list at discharge.   [name] expressed concerns regarding causes of infection. Patient endorses frequent nocturia and states he takes his last Lasix dose around ~7PM  Assessment: Understanding of regimen: good Understanding of indications: good Potential of compliance: good Barriers to Obtaining Medications: No  Patient instructed to contact inpatient pharmacy team with further questions or concerns if needed.    Time spent preparing for discharge counseling: 10 mins  Time spent counseling patient: 15 mins   Thank you for allowing pharmacy to be a part of this patient's care.  Carlean Jews, Pharm.D. PGY1 Pharmacy Resident 3/21/20186:26 PM Pager [phone]

## 2017-03-19 NOTE — Care Management Important Message (Signed)
Important Message  Patient Details  Name: [name] MRN: [id number] Date of Birth: 12/26/1941   Medicare Important Message Given:  Yes    Orbie Pyo 03/19/2017, 12:27 PM

## 2017-03-19 NOTE — Progress Notes (Signed)
Family Medicine Teaching Service Daily Progress Note Intern Pager: [phone]  Patient name: [name] Medical record number: [id number] Date of birth: 12-23-1941 Age: 76 y.o. Gender: male  Primary Care Provider: Lockie Pares, MD Consultants: Surgery Code Status: FULL  Pt Overview and Major Events to Date:  3/17: Patient admitted for partial SBO, NG tube placed    Assessment and Plan: [name] a 76 y.o.malepresenting with abdominal pain, found to have partial SBO.  PMH is significant for HTN, T2DM, HLD, morbid obesity, hx prostate adenocarcinoma, chronic severe lower extremity lymphedema, hx chronic osteomyelitis of right tibia.   Partial SBO vs Ileus:   NG tube clamped 3/19, removed 3/20. Patient tolerated clears and advanced to full liquid without nausea/vomiting. IVFs stopped 3/20. Has had multiple bowel movements.  Denies abdominal pain.  - Surgery following, appreciate recommendations  -Full liquid, ADAT to carb modified - Zofran for nausea - PT/OT consult - recc HHPT, hospital bed recommended.  However patient refusing home health because he has an aide who helps 8 hours a week.   DME for hospital bed placed.    Dysuria with UTI:  Chronic.  Pt endorses dysuria a few times per week. UA with large LE.  Now also endorsing suprapubic pain with urination.  Afebrile.  UOP stable at 1.7L. Urine culture with >100k Proteus Mirabilis. May be related to osmotic diuresis in the setting of poorly controlled diabetes; however, Huff symptoms and urine culture findings will treat for UTI.  Patient voided 100 cc urine yesterday.  Bladder scanned and no urine visible.  - Day 4 Augmentin (3/18 >3/21)  - Urine cx sensitive to cefazolin, CTX, Cipro, Bactrim.  Will de-escalate and transition to Keflex today.  Started 500 mg Q12 (for total course 7-10 days of antibiotics)  - Monitor urine output   AKI, Resolved: Cr 0.90.  - Hold Lasix - Trend BMET   Sinus Tachycardia, Resolved:   - Restarted home Lopressor 100mg  po bid 3/20.  Bradycardic this morning to HR 49-54 so held AM dose.  - Monitor HR and consider giving 1/2 dose Lopressor later today prior to discharge   Normocytic Anemia: stable. Hgb 10.0, MCV 84. Baseline 10-12. - Trend CBCs  HTN: Stable. BP this AM improved to 142/70. Takes Lopressor 100mg  bid, Triamterene-HCTZ 37.5-25mg  at home.  -Triamterene-HCTZ was held on admission while NPO but resumed yesterday in setting of elevated BPs.   -IV Hydral prn  -monitor BPs  T2DM: Last A1c was 11.8% on 10/25/16. Takes Levemir 60 units bid and Novolog 45 units bid at home. CBG 190-300.  Repeat A1c on this admission 14.3%.  -elected not to resume Levemir while NPO because high CBGs present at admission now well controlled- 115, 125, 109  -Continue rSSI coverage only for now   HLD: Last lipid panel in 2015 with Chol 131, HDL 39, LDL 83, TG 45. - Holding home Pravastatin while NPO  COPD: stable. On Albuterol prn and Flovent 1 puff bid at home. Lungs clear. - Continue home Albuterol prn - Pulmicort neb while hospitalized  Hx Prostate Cancer: Diagnosed 6-7 years ago. Not currently undergoing treatment.  - Holding home Flomax while NPO - Follow-up as outpatient  Chronic Severe Lower Extremity Lymphedema:  - Holding home Lasix with AKI - Wound consult - Continue home Silvadene cream  Hx Chronic Osteomyelitis of Right Tibia: Used to follow with Dr. Sharol Huff. Takes Tramadol at home. - Holding home Tramadol while NPO    GERD: stable - Holding home  Nexium while NPO  ?Paget's Disease: CT abdomen and pelvis showed left hemipelvis Paget's disease. Has been noted on previous bone scans and abdominal CTs. - Follow-up as outpatient  FEN/GI: carb mod Prophylaxis: Lovenox   Disposition: Anticipate discharge home today.   Subjective:  Patient feels well this morning.  Denies nausea, vomiting. Denies abdominal pain.  Has has several bowel movements.  Is  tolerating full liquid diet.     Objective: Temp:  [98.1 F (36.7 C)-98.7 F (37.1 C)] 98.3 F (36.8 C) (03/21 0419) Pulse Rate:  [49-61] 54 (03/21 0419) Resp:  [16-18] 18 (03/21 0419) BP: (119-143)/(49-70) 142/70 (03/21 0419) SpO2:  [96 %-99 %] 96 % (03/21 0830)   Physical Exam: General: Well-appearing, in NAD sitting in chair, eating breakfast  HEENT: [place]/AT, EOMI, MMM  Cardiovascular: RRR, II/VI systolic murmur loudest in the LUSB Respiratory: CTA B/L, normal work of breathing  Gastrointestinal: soft, no rebound, no guarding, +bs MSK: no calf tenderness, warm and well-perfused Derm: chronic venous stasis changes present in bilateral LE  Neuro: AAOx3, no focal deficits  Psych: mood and affect appropriate   Laboratory:  Recent Labs Lab 03/17/17 0608 03/18/17 0413 03/19/17 0330  WBC 7.1 7.3 7.9  HGB 10.3* 10.3* 9.5*  HCT 33.2* 33.4* 30.7*  PLT 250 264 273    Recent Labs Lab 03/14/17 2031  03/17/17 0608 03/18/17 0413 03/19/17 0330  NA 130*  < > 138 138 137  K 4.6  < > 3.7 3.6 3.5  CL 91*  < > 102 103 102  CO2 27  < > 26 28 28   BUN 28*  < > 8 <5* 5*  CREATININE 1.33*  < > 0.96 0.80 0.90  CALCIUM 9.0  < > 8.1* 8.4* 8.2*  PROT 8.0  --   --   --   --   BILITOT 0.1*  --   --   --   --   ALKPHOS 160*  --   --   --   --   ALT 13*  --   --   --   --   AST 13*  --   --   --   --   GLUCOSE 493*  < > 157* 110* 126*  < > = values in this interval not displayed.  Imaging/Diagnostic Tests: Ct Abdomen Pelvis W Contrast  Result Date: 03/14/2017 CLINICAL DATA:  Severe umbilical pain radiating to the back with nausea and vomiting EXAM: CT ABDOMEN AND PELVIS WITH CONTRAST TECHNIQUE: Multidetector CT imaging of the abdomen and pelvis was performed using the standard protocol following bolus administration of intravenous contrast. CONTRAST:  100 mL Isovue-300 intravenous COMPARISON:  Bone scan 11/13/2016, CT 11/13/2016, 11/11/2012 FINDINGS: Lower chest: Lung bases demonstrate no  acute consolidation or effusion. Heart size within normal limits. Coronary artery calcifications. Hepatobiliary: No focal hepatic abnormality. Multiple calcified gallstones. No biliary dilatation. Pancreas: Unremarkable. No pancreatic ductal dilatation or surrounding inflammatory changes. Spleen: Normal in size without focal abnormality. Adrenals/Urinary Tract: Adrenal glands are within normal limits. Kidneys show no hydronephrosis. Thick-walled bladder but partially obscured by artifact from surgical hardware are in the hips. Stomach/Bowel: Multiple loops of fluid-filled borderline enlarged small bowel within the abdomen and pelvis with distal decompressed small bowel loops. No colon wall thickening. Prominent appendix tip but contains gas within its lumen and no evidence for inflammation. Scattered sigmoid colon diverticula without acute inflammation. Vascular/Lymphatic: Atherosclerosis of the aorta. Stable 0 right iliac lymph nodes measuring up to 1.5 cm on the right  an 1.2 cm on the left. Reproductive: Obscured by artifact from pelvic hardware. No obvious masses. Other: No free air or free fluid.  Fat containing umbilical hernia. Musculoskeletal: Status post bilateral hip replacements with associated artifact. Changes of left hemipelvis Paget's disease as before. Diffuse sclerosis of the right iliac bone and SI joint which may be posttraumatic, also unchanged. IMPRESSION: 1. Negative appendix. 2. Borderline fluid-filled enlarged loops of small bowel within the abdomen and pelvis. Findings could be secondary to an ileus or developing/partial small bowel obstruction. 3. Thick-walled urinary bladder. 4. Stable osseous structures with apparent left hemipelvis Paget's disease. Electronically Signed   By: Donavan Foil M.D.   On: 03/14/2017 23:02   Dg Abd Portable 1v-small Bowel Obstruction Protocol-24 Hr Delay  Result Date: 03/17/2017 CLINICAL DATA:  Small bowel delay EXAM: PORTABLE ABDOMEN - 1 VIEW COMPARISON:   03/16/2017 FINDINGS: Oral contrast material is noted within the ascending colon, distal transverse colon and splenic flexure, and sigmoid colon. No dilated small bowel loops noted. NG tube tip is in the distal stomach. IMPRESSION: Oral contrast material is noted throughout parts of the colon as above. Electronically Signed   By: Rolm Baptise M.D.   On: 03/17/2017 07:11   Dg Abd Portable 1v-small Bowel Obstruction Protocol-initial, 8 Hr Delay  Result Date: 03/16/2017 CLINICAL DATA:  Small bowel obstruction. EXAM: PORTABLE ABDOMEN - 1 VIEW COMPARISON:  03/06/2017 at 1653 hours FINDINGS: Examination is limited by patient body habitus. Enteric tube remains in place terminating over the distal stomach. A small amount of contrast is present in the stomach. A small amount of contrast is also present in the right mid abdomen, most likely in ascending colon, as well as in the pelvis, possibly in sigmoid colon versus small bowel. Gas is present in scattered loops of grossly nondilated small bowel. Gas is also present in the rectum. Bilateral hip arthroplasties are noted. IMPRESSION: Limited examination due to patient body habitus. Contrast in the right mid abdomen and pelvis, possibly in ascending and sigmoid colon (versus small bowel). Electronically Signed   By: Logan Bores M.D.   On: 03/16/2017 21:57   Dg Abd Portable 1v  Result Date: 03/16/2017 CLINICAL DATA:  Evaluate NG tube placement EXAM: PORTABLE ABDOMEN - 1 VIEW COMPARISON:  March 15, 2017 FINDINGS: The NG tube terminates in the distal stomach. IMPRESSION: The NG tube terminates near the gastric antrum. Electronically Signed   By: Dorise Bullion III M.D   On: 03/16/2017 17:16   Dg Abd Portable 1 View  Result Date: 03/15/2017 CLINICAL DATA:  NG tube placement EXAM: PORTABLE ABDOMEN - 1 VIEW COMPARISON:  CT abdomen and pelvis 03/14/2017 FINDINGS: Enteric tube is present with tip in the right upper quadrant consistent with location in the distal stomach.  Mildly dilated gas-filled small bowel are demonstrated. IMPRESSION: Enteric tube tip is in the right upper quadrant consistent with location in the distal stomach. Electronically Signed   By: Lucienne Capers M.D.   On: 03/15/2017 01:20    Lovenia Kim, MD 03/19/2017, 1:51 PM PGY-1, [place] Intern pager: [phone], text pages welcome

## 2017-03-19 NOTE — Progress Notes (Signed)
[facility] Progress Note     Subjective: Pt has not had any more acid reflux. Still c/o mild abdominal pain in the epigastric region. Having BM's and tolerating diet. No nausea or vomiting.  Objective: Vital signs in last 24 hours: Temp:  [98.1 F (36.7 C)-98.7 F (37.1 C)] 98.3 F (36.8 C) (03/21 0419) Pulse Rate:  [49-61] 54 (03/21 0419) Resp:  [16-18] 18 (03/21 0419) BP: (119-143)/(49-70) 142/70 (03/21 0419) SpO2:  [96 %-99 %] 96 % (03/21 0830) Last BM Date: 03/18/17  Intake/Output from previous day: 03/20 0701 - 03/21 0700 In: 1607.5 [P.O.:940; I.V.:667.5] Out: 1900 [Urine:1900] Intake/Output this shift: Total I/O In: -  Out: 175 [Urine:175]  PE: Gen: Alert, NAD, pleasant, cooperative, well appearing Pulm: Rate andeffort normal Abd: Soft, obese, not distended,+BS, TTP in epigastric region Skin: no rashes noted, warm and dry  Lab Results:   Recent Labs  03/18/17 0413 03/19/17 0330  WBC 7.3 7.9  HGB 10.3* 9.5*  HCT 33.4* 30.7*  PLT 264 273   BMET  Recent Labs  03/18/17 0413 03/19/17 0330  NA 138 137  K 3.6 3.5  CL 103 102  CO2 28 28  GLUCOSE 110* 126*  BUN <5* 5*  CREATININE 0.80 0.90  CALCIUM 8.4* 8.2*   PT/INR No results for input(s): LABPROT, INR in the last 72 hours. CMP     Component Value Date/Time   NA 137 03/19/2017 0330   K 3.5 03/19/2017 0330   CL 102 03/19/2017 0330   CO2 28 03/19/2017 0330   GLUCOSE 126 (H) 03/19/2017 0330   BUN 5 (L) 03/19/2017 0330   CREATININE 0.90 03/19/2017 0330   CREATININE 0.99 11/04/2013 1436   CALCIUM 8.2 (L) 03/19/2017 0330   PROT 8.0 03/14/2017 2031   ALBUMIN 2.8 (L) 03/14/2017 2031   AST 13 (L) 03/14/2017 2031   ALT 13 (L) 03/14/2017 2031   ALKPHOS 160 (H) 03/14/2017 2031   BILITOT 0.1 (L) 03/14/2017 2031   GFRNONAA >60 03/19/2017 0330   GFRNONAA 75 11/04/2013 1436   GFRAA >60 03/19/2017 0330   GFRAA 87 11/04/2013 1436   Lipase     Component Value Date/Time   LIPASE 10  (L) 03/14/2017 2031       Studies/Results: No results found.  Anti-infectives: Anti-infectives    Start     Dose/Rate Route Frequency Ordered Stop   03/16/17 1000  amoxicillin-clavulanate (AUGMENTIN) 875-125 MG per tablet 1 tablet     1 tablet Oral Every 12 hours 03/16/17 0855         Assessment/Plan Ileus vs. pSBO - no history of abdominal surgeries/hernias decreases likelihood of a true SBO requiring operative intervention. - SBO protocol in place and contrast seen in colon in abd films yesterday - Pt having BM's, no abd pain, + flatus, tolerated clamped NGT and clears  Acid reflux - protonix BID and tums PRN  If he tolerates fulls can advance to soft diet for lunch. If he tolerates a soft diet then pt is clear for discharge from a surgical standpoint.    LOS: 4 days    Kalman Drape , [facility] 03/19/2017, 9:28 AM Pager: [phone] Consults: [phone] Mon-Fri 7:00 am-4:30 pm Sat-Sun 7:00 am-11:30 am

## 2017-03-19 NOTE — Progress Notes (Signed)
Patient refused to have his bilateral lower extremities washed and apply ointment at this time and stated that this will be done later today.

## 2017-03-19 NOTE — Progress Notes (Signed)
Spoke to patient about elevated A1C.  He states that he takes insulin however sometimes he misses.  Discussed timing of insulin.  He is not taking insulin until he eats which is usually lunchtime.  Discussed need for breakfast and to consistently take Levemir at 8a and 8p.  He discussed that he does not understand why blood sugars are up in the hospital even when he is not eating.  Discussed how the body needs insulin/glucose even when fasting/NPO.  Based on our conversation, patient seems to need lots of reinforcement with diabetes care.  Will place referral for [facility] as well.  Asked patient to please check blood sugars at least 2 times a day as well.  Patient agreed but needs follow-up.   Thanks, Adah Perl, RN, BC-ADM Inpatient Diabetes Coordinator Pager [phone] (8a-5p)

## 2017-03-19 NOTE — Progress Notes (Signed)
Discharged via [place].

## 2017-03-19 NOTE — Discharge Instructions (Signed)
You were admitted to the hospital for a small bowel obstruction.  A tube was placed to help you decompress and removed several days later.  We slowly advanced your diet as tolerated and at discharge you were stable.    The following change has been made to your medication called Lopressor.  You were having some lower heart rates and on discharge I cut your dose in half.   Take 1/2 tablet of your Lopressor in the AM and 1/2 tablet in the PM until your hospital follow up appointment.  I would like your heart rate to be checked at that time and your medication can be adjusted if needed.

## 2017-03-19 NOTE — Care Management Note (Signed)
Case Management Note  Patient Details  Name: [name] MRN: [id number] Date of Birth: Nov 06, 1941  Subjective/Objective:                    Action/Plan: Offered home health again , patient politely declined .   Called [facility] regarding hospital bed , they will call patient at [phone]( patient confirmed number).  Patient requesting ambulance transport home. Confirmed address on face sheet with patient. Spoke with bedside nurse . Discharge paper work not ready at present, NCM placed ambulance transport papers in shadow chart with phone number. Bedside nurse will call when patient ready.   Patient aware of all of the above and voiced understanding.  Expected Discharge Date:  03/19/17               Expected Discharge Plan:  Home/Self Care  In-House Referral:     Discharge planning Services  CM Consult  Post Acute Care Choice:  Home Health, Durable Medical Equipment Choice offered to:  Patient  DME Arranged:  Hospital bed DME Agency:  [place]:  Patient Refused [facility] Agency:     Status of Service:  Completed, signed off  If discussed at [place] of Stay Meetings, dates discussed:    Additional Comments:  Marilu Favre, RN 03/19/2017, 4:01 PM

## 2017-03-19 NOTE — Progress Notes (Signed)
PT Cancellation Note  Patient Details Name: [name] MRN: [id number] DOB: [birth year]   Cancelled Treatment:    Reason Eval/Treat Not Completed: Other (comment).  Pt reports he is d/c home soon via ambulance (today) and does not feel he needs to practice gait with crutches.  He declined HHPT (see RN CM note).  PT will check on him if he is still here tomorrow.   Thanks,    Barbarann Ehlers. [place], [place], DPT [phone]   03/19/2017, 4:23 PM

## 2017-03-19 NOTE — Discharge Summary (Signed)
[facility] Discharge Summary  Patient name: [name] Medical record number: [id number] Date of birth: [birth year] Age: 76 y.o. Gender: male Date of Admission: 03/14/2017  Date of Discharge: 03/19/2017 Admitting Physician: Kinnie Feil, MD  Primary Care Provider: Lockie Pares, MD Consultants: Surgery   Indication for Hospitalization: Abdominal pain   Discharge Diagnoses/Problem List:  Partial SBO HTN T2DM HLD Morbid obesity Hx of prostate adenocarcinoma Chronic severe LE lymphedema  Disposition: Home   Discharge Condition: stable, improved   Discharge Exam:  General: Well-appearing, in NAD sitting in chair, eating breakfast  HEENT: [place]/AT, EOMI, MMM  Cardiovascular: RRR, II/VI systolic murmur loudest in the LUSB Respiratory: CTA B/L, normal work of breathing  Gastrointestinal: soft, no rebound, no guarding, +bs MSK: no calf tenderness, warm and well-perfused Derm: chronic venous stasis changes present in bilateral LE  Neuro: AAOx3, no focal deficits  Psych: mood and affect appropriate   Brief Hospital Course:   [name] is a 76 year old gentleman who presented to the ED with abdominal pain.  CT A/P with enlarged loops of small bowel consistent with ileus vs developing/partial small bowel obstruction.  Surgery was consulted in the ED.  He had no signs of acute abdomen on physical exam and VSS.  NG tube placed for decompression in ED and he was admitted to [facility]. Patient was made NPO.  NG tube continued with good output and patient's abdominal pain resolved.  NG tube clamp trial successful and it was removed on 3/20.  He was started on a clear liquid diet and diet was advanced as tolerated.  On day of discharge he had good po intake on a carb-modified diet and no episodes of nausea or vomiting.  Had multiple bowel movements. He was stable at time of discharge and reported his abdominal pain had resolved.     Also reporting dysuria a few  times a week with increased frequency.  UA with large leukocytes, negative nitrites, and too numerous to count WBCs.  He was started on Augmentin and transitioned to Keflex after 4 days.  On discharge was sent home with Keflex for a total course of 10 days on antibiotics.    Of note, his home Lopressor was given IV during his hospitalization.  He was transitioned to his po home dose on 3/20 and noted to be bradycardic on morning of discharge.  He was discharged on half the dose of Lopressor and instructed to take it this way until his hospital follow up.    Issues for Follow Up:  1. Patient transitioned to home Lopressor on 3/20 and bradycardic to HR 49-54.  AM Lopressor dose was held the morning of discharge for this reason.  Discharged home on half the dose. Patient instructed to take 1/2 tablet Lopressor in AM and 1/2 in PM until hospital follow up.  Please follow up HR and blood pressures.  May resume his regular Lopressor dose if HR normalized.  2. BMET for Cr.  Patient had an AKI which resolved this admission.  3. Treated with Augmentin for a UTI.  Was transitioned to Keflex on discharge and he is to complete 6 days of this.  Please make sure he picked this antibiotic up and is taking it.  4. Follow up abdominal pain.  Patient had partial SBO on this admission requiring NG tube decompression.   5. A1c 14.3% on this admission.  Please ensure he is compliant with his medication. 6. PT saw and recommended [facility]  PT however patient refused it as he already has an aide who comes to help him for 8 hours a week.  PT also recommended an electric hospital bed which order was placed for.   Significant Procedures: NG tube decompression   Significant Labs and Imaging:   Recent Labs Lab 03/17/17 0608 03/18/17 0413 03/19/17 0330  WBC 7.1 7.3 7.9  HGB 10.3* 10.3* 9.5*  HCT 33.2* 33.4* 30.7*  PLT 250 264 273    Recent Labs Lab 03/14/17 2031 03/15/17 0551 03/16/17 0546 03/17/17 0608 03/18/17 0413  03/19/17 0330  NA 130* 132* 135 138 138 137  K 4.6 3.8 3.8 3.7 3.6 3.5  CL 91* 96* 100* 102 103 102  CO2 '27 27 30 26 28 28  ' GLUCOSE 493* 331* 206* 157* 110* 126*  BUN 28* '20 13 8 ' <5* 5*  CREATININE 1.33* 1.02 0.92 0.96 0.80 0.90  CALCIUM 9.0 8.6* 8.1* 8.1* 8.4* 8.2*  ALKPHOS 160*  --   --   --   --   --   AST 13*  --   --   --   --   --   ALT 13*  --   --   --   --   --   ALBUMIN 2.8*  --   --   --   --   --    Results/Tests Pending at Time of Discharge: None  Discharge Medications:  Allergies as of 03/19/2017      Reactions   Ace Inhibitors Cough   With Lisinopril.   Angiotensin Receptor Blockers Other (See Comments)   Caused excessive weight gain   Metformin Diarrhea      Medication List    TAKE these medications   aspirin 81 MG chewable tablet Chew 81 mg by mouth every other day.   cephALEXin 500 MG capsule Commonly known as:  KEFLEX Take 1 capsule (500 mg total) by mouth every 12 (twelve) hours.   EASY TOUCH PEN NEEDLES 31G X 8 MM Misc Generic drug:  Insulin Pen Needle USE WITH FLEXPEN AS DIRECTED   esomeprazole 40 MG capsule Commonly known as:  NEXIUM TAKE 1 CAPSULE BY MOUTH EVERY DAY   FLOVENT HFA 220 MCG/ACT inhaler Generic drug:  fluticasone INHALE 1 PUFF INTO THE LUNGS TWICE DAILY   fluticasone 0.05 % cream Commonly known as:  CUTIVATE Apply 1 application topically 2 (two) times daily. Reported on 05/23/2016   furosemide 40 MG tablet Commonly known as:  LASIX TAKE 1 TABLET BY MOUTH TWICE DAILY.   Insulin Detemir 100 UNIT/ML Pen Commonly known as:  LEVEMIR FLEXTOUCH Inject 50 Units into the skin 2 (two) times daily. What changed:  how much to take   latanoprost 0.005 % ophthalmic solution Commonly known as:  XALATAN INSTILL 1 DROP IN BOTH EYES EVERY EVENING FOR GLAUCOMA What changed:  Another medication with the same name was removed. Continue taking this medication, and follow the directions you see here.   metoprolol 100 MG tablet Commonly  known as:  LOPRESSOR Take 0.5 tablets (50 mg total) by mouth 2 (two) times daily. What changed:  See the new instructions.   mometasone 50 MCG/ACT nasal spray Commonly known as:  NASONEX PLACE 2 SPRAYS INTO THE NOSE DAILY   mupirocin ointment 2 % Commonly known as:  BACTROBAN Apply 1 application topically 2 (two) times daily. To legs   NOVOLOG FLEXPEN 100 UNIT/ML FlexPen Generic drug:  insulin aspart INJECT 45 UNITS SUBCUTANEOUSLY TWICE DAILY   ONE TOUCH  DELICA LANCING DEV Misc 75 Units by Does not apply route 2 (two) times daily.   ONE TOUCH ULTRA SYSTEM KIT w/Device Kit 1 kit by Does not apply route once.   ONE TOUCH ULTRA TEST test strip Generic drug:  glucose blood USE TO CHECK BLOOD SUGAR FOUR TIMES A DAY   ONE TOUCH ULTRA TEST test strip Generic drug:  glucose blood USE AS DIRECTED   ONETOUCH DELICA LANCETS 33I Misc USE TO CHECK BLOOD SUGAR TWICE DAILY   pravastatin 40 MG tablet Commonly known as:  PRAVACHOL TAKE 1 TABLET BY MOUTH DAILY   PROAIR HFA 108 (90 Base) MCG/ACT inhaler Generic drug:  albuterol INHALE 2 PUFFS BY MOUTH EVERY 4 HOURS AS NEEDED FOR WHEEZING OR SHORTNESS OF BREATH   silver sulfADIAZINE 1 % cream Commonly known as:  SILVADENE APPLY AS DIRECTED TO WOUND EVERY DAY.   tamsulosin 0.4 MG Caps capsule Commonly known as:  FLOMAX TAKE 2 CAPSULES BY MOUTH EVERY MORNING   traMADol 50 MG tablet Commonly known as:  ULTRAM TAKE 1 TABLET BY MOUTH TWICE DAILY AS NEEDED   triamcinolone ointment 0.1 % Commonly known as:  KENALOG APPLY TO AFFECTED AREA TWICE DAILY AS NEEDED   triamterene-hydrochlorothiazide 37.5-25 MG tablet Commonly known as:  MAXZIDE-25 TAKE 1 TABLET BY MOUTH EVERY DAY FOR BLOOD PRESSURE.            Durable Medical Equipment        Start     Ordered   03/18/17 1149  For home use only DME Hospital bed  Once    Question:  Bed type  Answer:  Semi-electric   03/18/17 1148     Discharge Instructions: Please refer to  Patient Instructions section of EMR for full details.  Patient was counseled important signs and symptoms that should prompt return to medical care, changes in medications, dietary instructions, activity restrictions, and follow up appointments.   Follow-Up Appointments: Follow-up Information    [place] Bing, DO Follow up on 03/21/2017.   Why:  hospital follow-up appointment at 3:30PM Contact information: [place] [address] [phone]          Lovenia Kim, MD 03/19/2017, 6:48 PM PGY-1, [place]

## 2017-03-20 ENCOUNTER — Other Ambulatory Visit: Payer: Self-pay

## 2017-03-20 NOTE — Patient Outreach (Signed)
[place] [facility]) Care Management  03/20/2017   [name] 27-Jan-1941 [id number]  Subjective:  I got out of the hospital yesterday, my medications have not been delivered  Objective:  Telephonic encounter  Current Medications:  Current Outpatient Prescriptions  Medication Sig Dispense Refill  . aspirin 81 MG chewable tablet Chew 81 mg by mouth every other day.     . Blood Glucose Monitoring Suppl (ONE TOUCH ULTRA SYSTEM KIT) W/DEVICE KIT 1 kit by Does not apply route once. 1 each 0  . cephALEXin (KEFLEX) 500 MG capsule Take 1 capsule (500 mg total) by mouth every 12 (twelve) hours. 12 capsule 0  . EASY TOUCH PEN NEEDLES 31G X 8 MM MISC USE WITH FLEXPEN AS DIRECTED 100 each 8  . esomeprazole (NEXIUM) 40 MG capsule TAKE 1 CAPSULE BY MOUTH EVERY DAY 60 capsule 0  . FLOVENT HFA 220 MCG/ACT inhaler INHALE 1 PUFF INTO THE LUNGS TWICE DAILY 3 Inhaler 2  . fluticasone (CUTIVATE) 0.05 % cream Apply 1 application topically 2 (two) times daily. Reported on 05/23/2016    . furosemide (LASIX) 40 MG tablet TAKE 1 TABLET BY MOUTH TWICE DAILY. 60 tablet 2  . Insulin Detemir (LEVEMIR FLEXTOUCH) 100 UNIT/ML Pen Inject 50 Units into the skin 2 (two) times daily. (Patient taking differently: Inject 60 Units into the skin 2 (two) times daily. ) 45 mL 10  . Lancet Devices (ONE TOUCH DELICA LANCING DEV) MISC 75 Units by Does not apply route 2 (two) times daily. 1 each 0  . latanoprost (XALATAN) 0.005 % ophthalmic solution INSTILL 1 DROP IN BOTH EYES EVERY EVENING FOR GLAUCOMA 2.5 mL 1  . metoprolol (LOPRESSOR) 100 MG tablet Take 0.5 tablets (50 mg total) by mouth 2 (two) times daily. 60 tablet 0  . mometasone (NASONEX) 50 MCG/ACT nasal spray PLACE 2 SPRAYS INTO THE NOSE DAILY 17 g 3  . mupirocin ointment (BACTROBAN) 2 % Apply 1 application topically 2 (two) times daily. To legs    . NOVOLOG FLEXPEN 100 UNIT/ML FlexPen INJECT 45 UNITS SUBCUTANEOUSLY TWICE DAILY 27 pen 3  . ONE TOUCH ULTRA TEST  test strip USE TO CHECK BLOOD SUGAR FOUR TIMES A DAY 100 each 11  . ONE TOUCH ULTRA TEST test strip USE AS DIRECTED 100 each 11  . ONETOUCH DELICA LANCETS 54Y MISC USE TO CHECK BLOOD SUGAR TWICE DAILY 200 each 10  . pravastatin (PRAVACHOL) 40 MG tablet TAKE 1 TABLET BY MOUTH DAILY 90 tablet 3  . PROAIR HFA 108 (90 Base) MCG/ACT inhaler INHALE 2 PUFFS BY MOUTH EVERY 4 HOURS AS NEEDED FOR WHEEZING OR SHORTNESS OF BREATH 8.5 each 2  . silver sulfADIAZINE (SILVADENE) 1 % cream APPLY AS DIRECTED TO WOUND EVERY DAY. 50 g 0  . tamsulosin (FLOMAX) 0.4 MG CAPS capsule TAKE 2 CAPSULES BY MOUTH EVERY MORNING 60 capsule 0  . traMADol (ULTRAM) 50 MG tablet TAKE 1 TABLET BY MOUTH TWICE DAILY AS NEEDED 60 tablet 0  . triamcinolone ointment (KENALOG) 0.1 % APPLY TO AFFECTED AREA TWICE DAILY AS NEEDED 80 g 0  . triamterene-hydrochlorothiazide (MAXZIDE-25) 37.5-25 MG tablet TAKE 1 TABLET BY MOUTH EVERY DAY FOR BLOOD PRESSURE. 30 tablet 2   No current facility-administered medications for this visit.     Functional Status:  In your present state of health, do you have any difficulty performing the following activities: 03/18/2017 03/16/2017  Hearing? - N  Vision? - N  Difficulty concentrating or making decisions? - N  Walking or  climbing stairs? Y Y  Dressing or bathing? - Y  Doing errands, shopping? Y -  Conservation officer, nature and eating ? - -  Using the Toilet? - -  In the past six months, have you accidently leaked urine? - -  Do you have problems with loss of bowel control? - -  Managing your Medications? - -  Managing your Finances? - -  Housekeeping or managing your Housekeeping? - -  Some recent data might be hidden    Fall/Depression Screening: PHQ 2/9 Scores 03/20/2017 11/25/2016 11/08/2016 11/08/2016 10/25/2016 04/11/2016 03/29/2016  PHQ - 2 Score 0 0 0 0 0 0 0   Fall Risk  03/20/2017 11/25/2016 11/08/2016 10/25/2016 04/11/2016  Falls in the past year? Yes Yes Yes No No  Number falls in past yr: _0 - -  Injury with Fall? Yes No No - -  Risk for fall due to : Impaired balance/gait;Impaired mobility;Impaired vision;Medication side effect History of fall(s);Impaired balance/gait;Impaired mobility;Impaired vision;Medication side effect History of fall(s);Impaired balance/gait;Impaired mobility;Impaired vision;Medication side effect - -  Risk for fall due to (comments): - - - - -  Follow up Follow up appointment;Falls prevention discussed Falls evaluation completed;Education provided;Falls prevention discussed Falls evaluation completed;Education provided;Falls prevention discussed - -   THN CM Care Plan Problem One     Most Recent Value  Care Plan Problem One  patient had recentl hospitalization for small bowel obstruction  Role Documenting the Problem One  Care Management Coordinator  Care Plan for Problem One  Active  THN Long Term Goal (31-90 days)  In the next 31 days, patient will have no more than on acute care admission related to small bowel obstructions  [facility]N Long Term Goal Start Date  03/13/17  Interventions for Problem One Long Term Goal  initial assessment, patient advises he remains adherent to soft, low residual diet    [facility] CM Care Plan Problem Two     Most Recent Value  Care Plan Problem Two  patient has multiple co-morbidities  Role Documenting the Problem Two  Care Management Coordinator  Care Plan for Problem Two  Active  [facility]N CM Short Term Goal #1 (0-30 days)  Patient will meet with [facility] RNCM for chronic disease education  [facility] CM Short Term Goal #1 Start Date  03/20/17  Interventions for Short Term Goal #2   Initial assessment to assess community care coordination needs  THN CM Short Term Goal #2 (0-30 days)  In the nexxt 21 days, patient will be compliant with 100% of his medical appointments  [facility] CM Short Term Goal #2 Start Date  03/20/17       Assessment:  This RNCM called to [place] to [place] who confirms receiving prescriptions. Estill Bamberg  stated she would be contacting patient to advise him of receiving the transmission of his prescriptions, would call to discuss delivery options with patient . Patient and RNCM collaborated to formulate his case management care plan.  Plan:  Home visit planned in the next 28 days for chronic disease education, assessment of community care coordination needs.

## 2017-03-20 NOTE — Progress Notes (Signed)
   Subjective:   Patient ID: [name]    DOB: [birth year], 76 y.o. male   MRN: [id number]  CC: "hospital follow up"  HPI: [name] is a 76 y.o. male who presents to clinic today for hospital follow up concerning partial SBO. Problems discussed today are as follows:  Partial SBO: Recently admitted to hospital 3/16-3/21 for partial SBO. Patient states he is much improved able to tolerate soft diet with 2 bowel movements since discharge, most recently last night with loose consistency. Denies abdominal pain, melena, hematochezia, nausea or vomiting. No previous history of SBO or bowel/pelvic surgery. Unsure what the etiology is.  Diabetes: A1c greater than 14% during hospitalization. On insulin Levemir 45 units daily and NovoLog 36 units 3 times a day with meals. Patient records glucose levels daily with postprandial in evening. Averages 100-150 with no lows and high of 400 during hospitalization. Patient states recent diet includes applesauce, liver putting, bologna.  Bradycardia: Patient with episodes of bradycardia at and below 50. Discharged with half dose Lopressor. Patient denies palpitations, shortness of breath, chest pain.  UTI: Diagnosis by UA with Escherichia coli susceptible to Keflex. Patient yet to fill prescription and was told he could take it out Monday. Patient to take for an additional 4 days. Denies symptoms of polyuria, dysuria, or fevers. Patient with a AKI during admission which improved by discharge.  ROS: complete ROS performed, see HPI for pertinent ROS.  [place]: HTN, uncontrolled IDDM with neuropathy, COPD, h/o adenocarcinoma of prostate, morbid obesity, impaired mobility, recent partial SBO. Smoking status reviewed. Medications reviewed.  Objective:   BP (!) 146/70   Pulse 97   Temp 97.9 F (36.6 C) (Oral)   Ht 6' (1.829 m)   Wt (!) 340 lb (154.2 kg) Comment: per pt. He would/could not weigh.  SpO2 98%   BMI 46.11 kg/m  Vitals and nursing note  reviewed.  General: morbidly obese, well nourished, well developed, in no acute distress with non-toxic appearance HEENT: normocephalic, atraumatic, moist mucous membranes Neck: supple, non-tender without lymphadenopathy CV: regular rate and rhythm without murmurs, rubs, or gallops, no lower extremity edema Lungs: clear to auscultation bilaterally with normal work of breathing Abdomen: soft, non-tender, non-distended, no masses or organomegaly palpable, normoactive bowel sounds Skin: warm, dry, no rashes or lesions, cap refill < 2 seconds Extremities: warm and well perfused, chronic bilateral lower extremity edema  Assessment & Plan:   Urinary tract infection without hematuria Acute. Asymptomatic. Sensitivities to Keflex by culture.  --Instructed to increase water intake and pick up Keflex Monday and complete course of abx   SBO (small bowel obstruction) Acute. Resolved. Tolerating soft diet with bowel movements more formed. --Increase water intake --Advance diet as tolerated --Red flags to seek medical attention discussed  DM type 2, uncontrolled, with neuropathy (HCC) Chronic. On insulin. Uncontrolled. --Discussed at length need to monitor sugar lvl fasting and 2 hr postprandial and record for next visit --Will check glucose lvl on CMET for resolving AKI --Would benefit from titration of insulin based on sugar lvls given A1c >14% during hospitalization --RTC in 3 months  Orders Placed This Encounter  Procedures  . Basic Metabolic Panel   No orders of the defined types were placed in this encounter.   Harriet Butte, [place], PGY-1 03/23/2017 7:20 PM

## 2017-03-21 ENCOUNTER — Encounter: Payer: Self-pay | Admitting: Family Medicine

## 2017-03-21 ENCOUNTER — Ambulatory Visit (INDEPENDENT_AMBULATORY_CARE_PROVIDER_SITE_OTHER): Payer: PPO | Admitting: Family Medicine

## 2017-03-21 VITALS — BP 146/70 | HR 97 | Temp 97.9°F | Ht 72.0 in | Wt 340.0 lb

## 2017-03-21 DIAGNOSIS — K56609 Unspecified intestinal obstruction, unspecified as to partial versus complete obstruction: Secondary | ICD-10-CM

## 2017-03-21 DIAGNOSIS — N3000 Acute cystitis without hematuria: Secondary | ICD-10-CM | POA: Diagnosis not present

## 2017-03-21 DIAGNOSIS — N179 Acute kidney failure, unspecified: Secondary | ICD-10-CM | POA: Diagnosis not present

## 2017-03-21 DIAGNOSIS — E1165 Type 2 diabetes mellitus with hyperglycemia: Secondary | ICD-10-CM

## 2017-03-21 DIAGNOSIS — E1140 Type 2 diabetes mellitus with diabetic neuropathy, unspecified: Secondary | ICD-10-CM

## 2017-03-21 DIAGNOSIS — IMO0002 Reserved for concepts with insufficient information to code with codable children: Secondary | ICD-10-CM

## 2017-03-21 NOTE — Patient Instructions (Addendum)
Thank you for coming in to see [place] today. Please see below to review our plan for today's visit.  1. I'm glad your symptoms have improved since the hospitalization. Please continue to advance her diet as you see tolerated. If you become more constipated or have abdominal pain, please come back to the clinic immediately as you are at risk of having another bowel obstruction. It is important that you take lots of water daily to help improve your bowel habits. 2. Please restart her Lopressor at your full dose.  3. Your diabetes is poorly controlled. I will need you to return to the clinic in one month to see your PCP for further management. Please continue taking your insulin as prescribed and check your blood sugar levels in the morning when he first wake up before you eat, and after each meal 2 hours after. Please record these in a journal and bring them to your next visit. 4. Will check your kidney function and notify you of your results. 5. Pickup your Keflex on Monday and take the full prescription until completed. If you begin to have urinary symptoms including increased urination or painful urination or fevers, return to the clinic. 6. . Return to clinic in one month.  Please call the clinic at [phone] if your symptoms worsen or you have any concerns. It was my pleasure to see you. -- Harriet Butte, [place], PGY-1

## 2017-03-22 LAB — BASIC METABOLIC PANEL
BUN/Creatinine Ratio: 12 (ref 10–24)
BUN: 11 mg/dL (ref 8–27)
CHLORIDE: 100 mmol/L (ref 96–106)
CO2: 25 mmol/L (ref 18–29)
Calcium: 8.7 mg/dL (ref 8.6–10.2)
Creatinine, Ser: 0.91 mg/dL (ref 0.76–1.27)
GFR calc non Af Amer: 81 mL/min/{1.73_m2} (ref >59)
GFR, EST AFRICAN AMERICAN: 94 mL/min/{1.73_m2} (ref >59)
GLUCOSE: 239 mg/dL — AB (ref 65–99)
POTASSIUM: 4 mmol/L (ref 3.5–5.2)
Sodium: 139 mmol/L (ref 134–144)

## 2017-03-23 ENCOUNTER — Encounter: Payer: Self-pay | Admitting: Family Medicine

## 2017-03-23 NOTE — Assessment & Plan Note (Signed)
Acute. Resolved. Tolerating soft diet with bowel movements more formed. --Increase water intake --Advance diet as tolerated --Red flags to seek medical attention discussed

## 2017-03-23 NOTE — Assessment & Plan Note (Signed)
Acute. Asymptomatic. Sensitivities to Keflex by culture.  --Instructed to increase water intake and pick up Keflex Monday and complete course of abx

## 2017-03-23 NOTE — Assessment & Plan Note (Addendum)
Chronic. On insulin. Uncontrolled. --Discussed at length need to monitor sugar lvl fasting and 2 hr postprandial and record for next visit --Will check glucose lvl on CMET for resolving AKI --Would benefit from titration of insulin based on sugar lvls given A1c >14% during hospitalization --RTC in 3 months

## 2017-03-24 ENCOUNTER — Ambulatory Visit: Payer: PPO

## 2017-03-27 ENCOUNTER — Other Ambulatory Visit: Payer: Self-pay

## 2017-03-30 NOTE — Patient Outreach (Signed)
[place] [facility]) Care Management   03/30/2017  CARROLL LINGELBACH 1941-06-01 [id number]  TYION BOYLEN is an 76 y.o. male  Subjective: My bood sugars have been running in the 100s  Objective:   ROS Obese, elderly man in motorized wheelchair.  Physical Exam ROS  Encounter Medications:   Outpatient Encounter Prescriptions as of 03/27/2017  Medication Sig  . aspirin 81 MG chewable tablet Chew 81 mg by mouth every other day.   . Blood Glucose Monitoring Suppl (ONE TOUCH ULTRA SYSTEM KIT) W/DEVICE KIT 1 kit by Does not apply route once.  Marland Kitchen EASY TOUCH PEN NEEDLES 31G X 8 MM MISC USE WITH FLEXPEN AS DIRECTED  . esomeprazole (NEXIUM) 40 MG capsule TAKE 1 CAPSULE BY MOUTH EVERY DAY  . FLOVENT HFA 220 MCG/ACT inhaler INHALE 1 PUFF INTO THE LUNGS TWICE DAILY  . fluticasone (CUTIVATE) 0.05 % cream Apply 1 application topically 2 (two) times daily. Reported on 05/23/2016  . furosemide (LASIX) 40 MG tablet TAKE 1 TABLET BY MOUTH TWICE DAILY.  Marland Kitchen Insulin Detemir (LEVEMIR FLEXTOUCH) 100 UNIT/ML Pen Inject 50 Units into the skin 2 (two) times daily. (Patient taking differently: Inject 60 Units into the skin 2 (two) times daily. )  . Lancet Devices (ONE TOUCH DELICA LANCING DEV) MISC 75 Units by Does not apply route 2 (two) times daily.  Marland Kitchen latanoprost (XALATAN) 0.005 % ophthalmic solution INSTILL 1 DROP IN BOTH EYES EVERY EVENING FOR GLAUCOMA  . metoprolol (LOPRESSOR) 100 MG tablet Take 0.5 tablets (50 mg total) by mouth 2 (two) times daily.  . mometasone (NASONEX) 50 MCG/ACT nasal spray PLACE 2 SPRAYS INTO THE NOSE DAILY  . mupirocin ointment (BACTROBAN) 2 % Apply 1 application topically 2 (two) times daily. To legs  . NOVOLOG FLEXPEN 100 UNIT/ML FlexPen INJECT 45 UNITS SUBCUTANEOUSLY TWICE DAILY  . ONE TOUCH ULTRA TEST test strip USE TO CHECK BLOOD SUGAR FOUR TIMES A DAY  . ONE TOUCH ULTRA TEST test strip USE AS DIRECTED  . ONETOUCH DELICA LANCETS 17E MISC USE TO CHECK BLOOD SUGAR TWICE  DAILY  . pravastatin (PRAVACHOL) 40 MG tablet TAKE 1 TABLET BY MOUTH DAILY  . PROAIR HFA 108 (90 Base) MCG/ACT inhaler INHALE 2 PUFFS BY MOUTH EVERY 4 HOURS AS NEEDED FOR WHEEZING OR SHORTNESS OF BREATH  . silver sulfADIAZINE (SILVADENE) 1 % cream APPLY AS DIRECTED TO WOUND EVERY DAY.  . tamsulosin (FLOMAX) 0.4 MG CAPS capsule TAKE 2 CAPSULES BY MOUTH EVERY MORNING  . traMADol (ULTRAM) 50 MG tablet TAKE 1 TABLET BY MOUTH TWICE DAILY AS NEEDED  . triamcinolone ointment (KENALOG) 0.1 % APPLY TO AFFECTED AREA TWICE DAILY AS NEEDED  . triamterene-hydrochlorothiazide (MAXZIDE-25) 37.5-25 MG tablet TAKE 1 TABLET BY MOUTH EVERY DAY FOR BLOOD PRESSURE.   No facility-administered encounter medications on file as of 03/27/2017.     Functional Status:   In your present state of health, do you have any difficulty performing the following activities: 03/18/2017 03/16/2017  Hearing? - N  Vision? - N  Difficulty concentrating or making decisions? - N  Walking or climbing stairs? Y Y  Dressing or bathing? - Y  Doing errands, shopping? Y -  Conservation officer, nature and eating ? - -  Using the Toilet? - -  In the past six months, have you accidently leaked urine? - -  Do you have problems with loss of bowel control? - -  Managing your Medications? - -  Managing your Finances? - -  Housekeeping or managing your  Housekeeping? - -  Some recent data might be hidden    Fall/Depression Screening:    PHQ 2/9 Scores 03/20/2017 11/25/2016 11/08/2016 11/08/2016 10/25/2016 04/11/2016 03/29/2016  PHQ - 2 Score 0 0 0 0 0 0 0      Assessment:   Patient's hgA1C was 14.3 during his last hospitalization. Patient advised by this RNCM a hgA1C means his blood glucose levels average in the 300s. Patient and RNCM viewed EMMI Video about Carbohydrates-counting and effects on glucose levels, diabetes Patient states he is compliant with medicaitons, does not eat sweets Patient agrees to meet with Dr. Nicholas Lose at [facility] for medication review. Patient also agrees to meet with Dr. Radonna Ricker, Nutritionist at [facility] for diabetes education.  Plan:  Referral to community resources for diabetic and medical management (at [facility])

## 2017-04-03 ENCOUNTER — Other Ambulatory Visit (INDEPENDENT_AMBULATORY_CARE_PROVIDER_SITE_OTHER): Payer: Self-pay | Admitting: Orthopedic Surgery

## 2017-04-03 ENCOUNTER — Other Ambulatory Visit: Payer: Self-pay | Admitting: Internal Medicine

## 2017-04-03 ENCOUNTER — Other Ambulatory Visit: Payer: Self-pay

## 2017-04-03 ENCOUNTER — Telehealth: Payer: Self-pay | Admitting: Family Medicine

## 2017-04-03 NOTE — Telephone Encounter (Signed)
I called to schedule appt for medical nutrition therapy per Loni Muse, but Mr. Maharaj said he is not interested at this time.

## 2017-04-03 NOTE — Patient Outreach (Signed)
[facility] [facility]) Care Management  04/03/2017   [name] [birth year] [id number]  Subjective:  My blood sugars have been running good. I have not made any appointments cause none of the doctors are telling me anything.  Objective:  Telephonic encounter. Patient appears to be resistant to making health changes or participate in any educational activities.  Current Medications:  Current Outpatient Prescriptions  Medication Sig Dispense Refill  . aspirin 81 MG chewable tablet Chew 81 mg by mouth every other day.     . Blood Glucose Monitoring Suppl (ONE TOUCH ULTRA SYSTEM KIT) W/DEVICE KIT 1 kit by Does not apply route once. 1 each 0  . EASY TOUCH PEN NEEDLES 31G X 8 MM MISC USE WITH FLEXPEN AS DIRECTED 100 each 8  . esomeprazole (NEXIUM) 40 MG capsule TAKE 1 CAPSULE BY MOUTH EVERY DAY 60 capsule 0  . FLOVENT HFA 220 MCG/ACT inhaler INHALE 1 PUFF INTO THE LUNGS TWICE DAILY 3 Inhaler 2  . fluticasone (CUTIVATE) 0.05 % cream Apply 1 application topically 2 (two) times daily. Reported on 05/23/2016    . furosemide (LASIX) 40 MG tablet TAKE 1 TABLET BY MOUTH TWICE DAILY. 60 tablet 2  . Insulin Detemir (LEVEMIR FLEXTOUCH) 100 UNIT/ML Pen Inject 50 Units into the skin 2 (two) times daily. (Patient taking differently: Inject 60 Units into the skin 2 (two) times daily. ) 45 mL 10  . Lancet Devices (ONE TOUCH DELICA LANCING DEV) MISC 75 Units by Does not apply route 2 (two) times daily. 1 each 0  . latanoprost (XALATAN) 0.005 % ophthalmic solution INSTILL 1 DROP IN BOTH EYES EVERY EVENING FOR GLAUCOMA 2.5 mL 1  . metoprolol (LOPRESSOR) 100 MG tablet Take 0.5 tablets (50 mg total) by mouth 2 (two) times daily. 60 tablet 0  . mometasone (NASONEX) 50 MCG/ACT nasal spray PLACE 2 SPRAYS INTO THE NOSE DAILY 17 g 3  . mupirocin ointment (BACTROBAN) 2 % Apply 1 application topically 2 (two) times daily. To legs    . NOVOLOG FLEXPEN 100 UNIT/ML FlexPen INJECT 45 UNITS SUBCUTANEOUSLY TWICE  DAILY 27 pen 3  . ONE TOUCH ULTRA TEST test strip USE TO CHECK BLOOD SUGAR FOUR TIMES A DAY 100 each 11  . ONE TOUCH ULTRA TEST test strip USE AS DIRECTED 100 each 11  . ONETOUCH DELICA LANCETS 50Y MISC USE TO CHECK BLOOD SUGAR TWICE DAILY 200 each 10  . pravastatin (PRAVACHOL) 40 MG tablet TAKE 1 TABLET BY MOUTH DAILY 90 tablet 3  . PROAIR HFA 108 (90 Base) MCG/ACT inhaler INHALE 2 PUFFS BY MOUTH EVERY 4 HOURS AS NEEDED FOR WHEEZING OR SHORTNESS OF BREATH 8.5 each 2  . silver sulfADIAZINE (SILVADENE) 1 % cream APPLY AS DIRECTED TO WOUND EVERY DAY. 50 g 0  . tamsulosin (FLOMAX) 0.4 MG CAPS capsule TAKE 2 CAPSULES BY MOUTH EVERY MORNING 60 capsule 0  . traMADol (ULTRAM) 50 MG tablet TAKE 1 TABLET BY MOUTH TWICE DAILY AS NEEDED 60 tablet 0  . triamcinolone ointment (KENALOG) 0.1 % APPLY TO AFFECTED AREA TWICE DAILY AS NEEDED 80 g 0  . triamterene-hydrochlorothiazide (MAXZIDE-25) 37.5-25 MG tablet TAKE 1 TABLET BY MOUTH EVERY DAY FOR BLOOD PRESSURE. 30 tablet 2   No current facility-administered medications for this visit.     Functional Status:  In your present state of health, do you have any difficulty performing the following activities: 03/18/2017 03/16/2017  Hearing? - N  Vision? - N  Difficulty concentrating or making decisions? - N  Walking or climbing stairs? Y Y  Dressing or bathing? - Y  Doing errands, shopping? Y -  Conservation officer, nature and eating ? - -  Using the Toilet? - -  In the past six months, have you accidently leaked urine? - -  Do you have problems with loss of bowel control? - -  Managing your Medications? - -  Managing your Finances? - -  Housekeeping or managing your Housekeeping? - -  Some recent data might be hidden    Fall/Depression Screening: PHQ 2/9 Scores 03/20/2017 11/25/2016 11/08/2016 11/08/2016 10/25/2016 04/11/2016 03/29/2016  PHQ - 2 Score 0 0 0 0 0 0 0   THN CM Care Plan Problem One     Most Recent Value  Care Plan Problem One  patient had recentl  hospitalization for small bowel obstruction  Role Documenting the Problem One  Care Management Coordinator  Care Plan for Problem One  Active  THN Long Term Goal (31-90 days)  In the next 31 days, patient will have no more than on acute care admission related to small bowel obstructions  THN Long Term Goal Start Date  03/13/17  Interventions for Problem One Long Term Goal  telephone assessment to assess patient's understanding of the need to check glucose levels daily.  Patient reports fasing blood sugars 120s and post meals 140s.  RNCM advised patient those readings was not consistent with a glucose level of 14.3.    THN CM Short Term Goal #1 (0-30 days)  In the next 28 days, patient will make appointments with [facility] Nutritionist and Pharmacist  Interventions for Short Term Goal #1  contact made with family practice pharmacist and  dietician, however patient refuses to make appointements.      [facility] CM Care Plan Problem Two     Most Recent Value  Care Plan Problem Two  patient has multiple co-morbidities  Role Documenting the Problem Two  Care Management Coordinator  Care Plan for Problem Two  Active  THN CM Short Term Goal #1 (0-30 days)  Patient will meet with [facility] RNCM for chronic disease education  [facility] CM Short Term Goal #1 Start Date  03/20/17  Interventions for Short Term Goal #2   telephone for chronic disease education.  Patient states his blood glucose has been running  normal. Patient refuses education intervention   THN CM Short Term Goal #2 (0-30 days)  In the nexxt 21 days, patient will be compliant with 100% of his medical appointments  THN CM Short Term Goal #2 Start Date  03/20/17  Interventions for Short Term Goal #2  patient states he has not made the appointment to follow up with his primary care physician stating no body is telling him anything.  This RNCM advised patient to write down his questions,take them to the appointment with him to discuss his diagnosis.  RNCM  advised patient she would be in attendance with him if he would make the appointment to assist him in getting his answers.       Assessment:  Patient has refused to make appointments with Dr. Jenne Campus, FP Nutritionist for nutrition education or Dr. Nicholas Lose for pharmacy education. Patient has a greed to meet with [facility] pharmacist at his resident. Patient reports fasting blood glucose levels fasting 110-120s, post meals 140s. Patient refuses to make post acute care follow up appointments for further monitoring/education.  Plan:  Telephone contact in the next 21 days to assess for community care coordination need. Referral made to [facility]  Pharmacy for medication reconciliation, education

## 2017-04-04 NOTE — Telephone Encounter (Signed)
I called tramadol into patients pharmacy.

## 2017-04-10 ENCOUNTER — Other Ambulatory Visit: Payer: Self-pay

## 2017-04-10 NOTE — Patient Outreach (Signed)
[facility]) Care Management  04/10/2017   [name] [birth year] [id number]  Subjective:  I am doing good. I don't think I need anything.  Objective:  Telephone encounter.   Current Medications:  Current Outpatient Prescriptions  Medication Sig Dispense Refill  . aspirin 81 MG chewable tablet Chew 81 mg by mouth every other day.     . Blood Glucose Monitoring Suppl (ONE TOUCH ULTRA SYSTEM KIT) W/DEVICE KIT 1 kit by Does not apply route once. 1 each 0  . EASY TOUCH PEN NEEDLES 31G X 8 MM MISC USE WITH FLEXPEN AS DIRECTED 100 each 8  . esomeprazole (NEXIUM) 40 MG capsule TAKE 1 CAPSULE BY MOUTH EVERY DAY 60 capsule 0  . FLOVENT HFA 220 MCG/ACT inhaler INHALE 1 PUFF INTO THE LUNGS TWICE DAILY 3 Inhaler 2  . fluticasone (CUTIVATE) 0.05 % cream Apply 1 application topically 2 (two) times daily. Reported on 05/23/2016    . furosemide (LASIX) 40 MG tablet TAKE 1 TABLET BY MOUTH TWICE DAILY. 60 tablet 2  . Insulin Detemir (LEVEMIR FLEXTOUCH) 100 UNIT/ML Pen Inject 50 Units into the skin 2 (two) times daily. (Patient taking differently: Inject 60 Units into the skin 2 (two) times daily. ) 45 mL 10  . Lancet Devices (ONE TOUCH DELICA LANCING DEV) MISC 75 Units by Does not apply route 2 (two) times daily. 1 each 0  . latanoprost (XALATAN) 0.005 % ophthalmic solution INSTILL 1 DROP IN BOTH EYES EVERY EVENING FOR GLAUCOMA 2.5 mL 1  . metoprolol (LOPRESSOR) 100 MG tablet Take 0.5 tablets (50 mg total) by mouth 2 (two) times daily. 60 tablet 0  . mometasone (NASONEX) 50 MCG/ACT nasal spray INSTILL 2 SPRAYS INTO THE NOSE DAILY 17 g 2  . mupirocin ointment (BACTROBAN) 2 % Apply 1 application topically 2 (two) times daily. To legs    . NOVOLOG FLEXPEN 100 UNIT/ML FlexPen INJECT 45 UNITS SUBCUTANEOUSLY TWICE DAILY 27 pen 3  . ONE TOUCH ULTRA TEST test strip USE TO CHECK BLOOD SUGAR FOUR TIMES A DAY 100 each 11  . ONE TOUCH ULTRA TEST test strip USE AS DIRECTED 100 each 11  .  ONETOUCH DELICA LANCETS 72I MISC USE TO CHECK BLOOD SUGAR TWICE DAILY 200 each 10  . pravastatin (PRAVACHOL) 40 MG tablet TAKE 1 TABLET BY MOUTH DAILY 90 tablet 3  . PROAIR HFA 108 (90 Base) MCG/ACT inhaler INHALE 2 PUFFS BY MOUTH EVERY 4 HOURS AS NEEDED FOR WHEEZING OR SHORTNESS OF BREATH 8.5 each 2  . silver sulfADIAZINE (SILVADENE) 1 % cream APPLY AS DIRECTED TO WOUND EVERY DAY. 50 g 0  . tamsulosin (FLOMAX) 0.4 MG CAPS capsule TAKE 2 CAPSULES BY MOUTH EVERY MORNING 60 capsule 0  . traMADol (ULTRAM) 50 MG tablet TAKE 1 TABLET BY MOUTH 2 TIMES A DAY AS NEEDED 60 tablet 0  . triamcinolone ointment (KENALOG) 0.1 % APPLY TO AFFECTED AREA TWICE DAILY AS NEEDED 80 g 2  . triamterene-hydrochlorothiazide (MAXZIDE-25) 37.5-25 MG tablet TAKE 1 TABLET BY MOUTH EVERY DAY FOR BLOOD PRESSURE. 30 tablet 2   No current facility-administered medications for this visit.     Functional Status:  In your present state of health, do you have any difficulty performing the following activities: 03/18/2017 03/16/2017  Hearing? - N  Vision? - N  Difficulty concentrating or making decisions? - N  Walking or climbing stairs? Y Y  Dressing or bathing? - Y  Doing errands, shopping? Y -  Conservation officer, nature and eating ? - -  Using the Toilet? - -  In the past six months, have you accidently leaked urine? - -  Do you have problems with loss of bowel control? - -  Managing your Medications? - -  Managing your Finances? - -  Housekeeping or managing your Housekeeping? - -  Some recent data might be hidden    Fall/Depression Screening: PHQ 2/9 Scores 03/20/2017 11/25/2016 11/08/2016 11/08/2016 10/25/2016 04/11/2016 03/29/2016  PHQ - 2 Score 0 0 0 0 0 0 0   [facility]N CM Care Plan Problem One     Most Recent Value  Care Plan Problem One  patient had recentl hospitalization for small bowel obstruction  Role Documenting the Problem One  Care Management Coordinator  Care Plan for Problem One  Active  THN Long Term Goal (31-90  days)  In the next 31 days, patient will have no more than on acute care admission related to small bowel obstructions  THN Long Term Goal Start Date  03/13/17  Interventions for Problem One Long Term Goal  telephone assessment to assess patientsprogressin reachingthis goal.  Patienthas had no acute care admissions  THN CM Short Term Goal #1 (0-30 days)  In the next 28 days, patient will make appointments with [facility] Nutritionist and Pharmacist  Interventions for Short Term Goal #1  contact made with family practice pharmacist and  dietician, however patient refuses to make appointements.      [facility] CM Care Plan Problem Two     Most Recent Value  Care Plan Problem Two  patient has multiple co-morbidities  Role Documenting the Problem Two  Care Management Coordinator  Care Plan for Problem Two  Active  THN CM Short Term Goal #1 (0-30 days)  Patient will meet with [facility] RNCM for chronic disease education  [facility] CM Short Term Goal #1 Start Date  03/20/17  [facility] CM Short Term Goal #1 Met Date   04/10/17  Interventions for Short Term Goal #2   telephone for chronic disease education.  Patient states his blood glucose has been running  normal. Patient refuses education intervention   THN CM Short Term Goal #2 (0-30 days)  In the nexxt 21 days, patient will be compliant with 100% of his medical appointments  THN CM Short Term Goal #2 Start Date  03/20/17  [facility] CM Short Term Goal #2 Met Date  04/10/17  Interventions for Short Term Goal #2  Patient advises he has been compliant with medical appointments with primary care physcan, specialist. patient has refused appointments with FP pharmacist, nutritionist     Assessment:  Patient reports fasting blood glucose levels in the 120s, post meals readings in the 140s. Patient continues to refuses referrals with [facility] Pharmacist and nutritionist. Patient denies need for home health services for wound care (bilateral lower extremities). Patient  states he and his in home aide (from [facility]) dress and wrap his lower extremities and apply ace wraps. Patient states he has no broken skin on the lower extremities. Plan:  Telephone contact in the next 31 days to assess progress of patient in reaching his case management goals

## 2017-04-11 ENCOUNTER — Other Ambulatory Visit: Payer: Self-pay | Admitting: Pharmacist

## 2017-04-11 NOTE — Patient Outreach (Signed)
[facility] [facility]) Care Management  04/11/2017  [name] 21-Jul-1941 [id number]  76 y.o. year old male referred to [facility] for Medication Management (Pharmacy Telephone Followup)  Consult was for medication reconciliation and education.  Per consult/chart reviewpatient's hgA1C is 14.3. He refuses to make appointment to go family practice, however, he states he would meet with someone where he lives.  Called patient today and described [facility] pharmacy services.  He states "he does not need pharmacist help" and he is doing okay on his own.  Patient refused [facility] pharmacy services and stated he did not have time to discuss this right now.    Per chart review he has refused meeting with Nicholas Lose, PharmD and Idelia Salm    Plan: [place] will not open case as patient has refused [place] services Will notify Dr Emmaline Life and ask her to discuss meeting with family medicine pharmacist for assistance with medication management and diabetes during her next office visit.     Bennye Alm, PharmD, [place] PGY2 Pharmacy Resident [phone]

## 2017-04-14 DIAGNOSIS — S82209A Unspecified fracture of shaft of unspecified tibia, initial encounter for closed fracture: Secondary | ICD-10-CM | POA: Diagnosis not present

## 2017-04-14 DIAGNOSIS — R269 Unspecified abnormalities of gait and mobility: Secondary | ICD-10-CM | POA: Diagnosis not present

## 2017-04-14 DIAGNOSIS — E1165 Type 2 diabetes mellitus with hyperglycemia: Secondary | ICD-10-CM | POA: Diagnosis not present

## 2017-04-14 DIAGNOSIS — J449 Chronic obstructive pulmonary disease, unspecified: Secondary | ICD-10-CM | POA: Diagnosis not present

## 2017-04-24 ENCOUNTER — Telehealth: Payer: Self-pay | Admitting: Internal Medicine

## 2017-04-24 ENCOUNTER — Other Ambulatory Visit: Payer: Self-pay

## 2017-04-24 ENCOUNTER — Ambulatory Visit: Payer: PPO

## 2017-04-24 NOTE — Telephone Encounter (Signed)
Please call patient and schedule him an appointment with Dr. Valentina Lucks for diabetes

## 2017-04-24 NOTE — Patient Outreach (Signed)
[facility]) Care Management   04/24/2017  [name] [birth year] [id number]  [name] is an 76 y.o. male  Subjective:  My blood glucose levels have been running between 50s and 200s.  Objective:   ROS  Very obese gentleman in electric wheelchair.  Physical Exam  ROS  Encounter Medications:   Outpatient Encounter Prescriptions as of 04/24/2017  Medication Sig  . aspirin 81 MG chewable tablet Chew 81 mg by mouth every other day.   . Blood Glucose Monitoring Suppl (ONE TOUCH ULTRA SYSTEM KIT) W/DEVICE KIT 1 kit by Does not apply route once.  Marland Kitchen EASY TOUCH PEN NEEDLES 31G X 8 MM MISC USE WITH FLEXPEN AS DIRECTED  . esomeprazole (NEXIUM) 40 MG capsule TAKE 1 CAPSULE BY MOUTH EVERY DAY  . FLOVENT HFA 220 MCG/ACT inhaler INHALE 1 PUFF INTO THE LUNGS TWICE DAILY  . fluticasone (CUTIVATE) 0.05 % cream Apply 1 application topically 2 (two) times daily. Reported on 05/23/2016  . furosemide (LASIX) 40 MG tablet TAKE 1 TABLET BY MOUTH TWICE DAILY.  Marland Kitchen Insulin Detemir (LEVEMIR FLEXTOUCH) 100 UNIT/ML Pen Inject 50 Units into the skin 2 (two) times daily. (Patient taking differently: Inject 60 Units into the skin 2 (two) times daily. )  . Lancet Devices (ONE TOUCH DELICA LANCING DEV) MISC 75 Units by Does not apply route 2 (two) times daily.  Marland Kitchen latanoprost (XALATAN) 0.005 % ophthalmic solution INSTILL 1 DROP IN BOTH EYES EVERY EVENING FOR GLAUCOMA  . metoprolol (LOPRESSOR) 100 MG tablet Take 0.5 tablets (50 mg total) by mouth 2 (two) times daily.  . mometasone (NASONEX) 50 MCG/ACT nasal spray INSTILL 2 SPRAYS INTO THE NOSE DAILY  . mupirocin ointment (BACTROBAN) 2 % Apply 1 application topically 2 (two) times daily. To legs  . NOVOLOG FLEXPEN 100 UNIT/ML FlexPen INJECT 45 UNITS SUBCUTANEOUSLY TWICE DAILY  . ONE TOUCH ULTRA TEST test strip USE TO CHECK BLOOD SUGAR FOUR TIMES A DAY  . ONE TOUCH ULTRA TEST test strip USE AS DIRECTED  . ONETOUCH DELICA LANCETS 15V MISC USE TO  CHECK BLOOD SUGAR TWICE DAILY  . pravastatin (PRAVACHOL) 40 MG tablet TAKE 1 TABLET BY MOUTH DAILY  . PROAIR HFA 108 (90 Base) MCG/ACT inhaler INHALE 2 PUFFS BY MOUTH EVERY 4 HOURS AS NEEDED FOR WHEEZING OR SHORTNESS OF BREATH  . silver sulfADIAZINE (SILVADENE) 1 % cream APPLY AS DIRECTED TO WOUND EVERY DAY.  . tamsulosin (FLOMAX) 0.4 MG CAPS capsule TAKE 2 CAPSULES BY MOUTH EVERY MORNING  . traMADol (ULTRAM) 50 MG tablet TAKE 1 TABLET BY MOUTH 2 TIMES A DAY AS NEEDED  . triamcinolone ointment (KENALOG) 0.1 % APPLY TO AFFECTED AREA TWICE DAILY AS NEEDED  . triamterene-hydrochlorothiazide (MAXZIDE-25) 37.5-25 MG tablet TAKE 1 TABLET BY MOUTH EVERY DAY FOR BLOOD PRESSURE.   No facility-administered encounter medications on file as of 04/24/2017.     Functional Status:   In your present state of health, do you have any difficulty performing the following activities: 03/18/2017 03/16/2017  Hearing? - N  Vision? - N  Difficulty concentrating or making decisions? - N  Walking or climbing stairs? Y Y  Dressing or bathing? - Y  Doing errands, shopping? Y -  Conservation officer, nature and eating ? - -  Using the Toilet? - -  In the past six months, have you accidently leaked urine? - -  Do you have problems with loss of bowel control? - -  Managing your Medications? - -  Managing your Finances? - -  Housekeeping or managing your Housekeeping? - -  Some recent data might be hidden    Fall/Depression Screening:    PHQ 2/9 Scores 03/20/2017 11/25/2016 11/08/2016 11/08/2016 10/25/2016 04/11/2016 03/29/2016  PHQ - 2 Score 0 0 0 0 0 0 0   THN CM Care Plan Problem One     Most Recent Value  Care Plan Problem One  patient had recentl hospitalization for small bowel obstruction  Role Documenting the Problem One  Care Management Coordinator  Care Plan for Problem One  Active  THN Long Term Goal (31-90 days)  In the next 31 days, patient will have no more than on acute care admission related to small bowel  obstructions  THN Long Term Goal Start Date  03/13/17  [facility] Long Term Goal Met Date  04/24/17  Interventions for Problem One Long Term Goal  telephone contact with patient to assess his progresswith meeting goal. Patient hashad no acute care admissions in the last 31 days.  THN CM Short Term Goal #1 (0-30 days)  In the next 28 days, patient will make appointments with [facility] Nutritionist and Pharmacist  Interventions for Short Term Goal #1  contact made with family practice pharmacist and  dietician, however patient refuses to make appointements.      [facility] CM Care Plan Problem Two     Most Recent Value  Care Plan Problem Two  patient has multiple co-morbidities  Role Documenting the Problem Two  Care Management Coordinator  Care Plan for Problem Two  Active  THN CM Short Term Goal #1 (0-30 days)  Patient will meet with [facility] RNCM for chronic disease education  [facility] CM Short Term Goal #1 Start Date  03/20/17  [facility] CM Short Term Goal #1 Met Date   04/10/17  Interventions for Short Term Goal #2   telephone for chronic disease education.  Patient states his blood glucose has been running  normal. Patient refuses education intervention   THN CM Short Term Goal #2 (0-30 days)  In the nexxt 21 days, patient will be compliant with 100% of his medical appointments  THN CM Short Term Goal #2 Start Date  03/20/17  [facility] CM Short Term Goal #2 Met Date  04/10/17  Interventions for Short Term Goal #2  Patient advises he has been compliant with medical appointments with primary care physcan, specialist. patient has refused appointments with [facility] pharmacist, nutritionist      Assessment:   Patient reminded this RNCM he is not interested in diabetes education with dietician, pharmacist or in the classroom settign. Patient stated he thinks he knows all he needs to about diabetes. Patient did admit to this RNCM his glucometer is over 68 years old, agreed to receive a new glucometer. Patient refuses [facility]ACE  referral. Patient does not appear to be interested in additional education, making positive changes as evidenced by his statements.  Plan:  Telephone contact in the next 31 days for discharge.

## 2017-04-25 NOTE — Telephone Encounter (Signed)
Called pt to schedule an appt and he said he would call back to schedule. He has to look at his schedule first. Delray Alt, CMA

## 2017-04-25 NOTE — Telephone Encounter (Signed)
This encounter was created in error - please disregard.

## 2017-05-01 ENCOUNTER — Telehealth: Payer: Self-pay | Admitting: Pharmacist

## 2017-05-01 DIAGNOSIS — IMO0001 Reserved for inherently not codable concepts without codable children: Secondary | ICD-10-CM

## 2017-05-01 DIAGNOSIS — E1165 Type 2 diabetes mellitus with hyperglycemia: Principal | ICD-10-CM

## 2017-05-01 MED ORDER — ONETOUCH ULTRA SYSTEM W/DEVICE KIT: 1.0000 | PACK | Freq: Once | 0 refills | Status: AC

## 2017-05-01 NOTE — Telephone Encounter (Signed)
-----   Message from Kem Parkinson, [facility] sent at 04/30/2017  8:54 AM EDT ----- Yes just send in a prescription.  If it is more than $0 that would mean it is not "in network".  I honestly dont know how to find that.  I believe most pharmacies around here are in network.  Bennye Alm, PharmD, [place] PGY2 Pharmacy Resident [phone]  ----- Message ----- From: Leavy Cella, [facility] Sent: 04/29/2017   7:01 PM To: Kem Parkinson, RPH  Can you help me with "in - network pharmacy"?   Do I need to just send a prescription from our end? Please let me know where.    ----- Message ----- From: Kem Parkinson, RPH Sent: 04/29/2017  11:30 AM To: Tobi Bastos, RN, Leavy Cella, [facility]  He has HTA so Freestyle, Precision, or One Touch meter at an [place] should be $0 copay.    Thanks Bennye Alm, PharmD, [place] PGY2 Pharmacy Resident [phone]    ----- Message ----- From: Leavy Cella, [facility] Sent: 04/25/2017  10:26 AM To: Tobi Bastos, RN, Kem Parkinson, RPH  I think a new meter should be available through his medicare plan.   Likely he has used a Health visitor (Onetouch ultra 2 possibly).  I think we could work on this through his pharmacy sooner than the 10th.  I have included Kelsy on this to see if he agrees.   PK   ----- Message ----- From: Tobi Bastos, RN Sent: 04/24/2017  10:21 AM To: Tonette Bihari, MD, Leavy Cella, [place]    I am with Mr. Town discussing his diabetes. He has informed me his meter is 78 years old. He reports having blood glucose levels between 50 to 200s.I advised him his hgA1c of 14.3 does not support that.  He has agreed to change his meter with one from [facility] as it may be more accurate than any others.  He has an appointment on 5/10, can he get a new meter then? This is the only thing he has agreed to since I have been working with him.  I cc'ed Dr. Valentina Lucks so he will be in the loop. TY

## 2017-05-01 NOTE — Telephone Encounter (Signed)
One Touch Ultra 2 - New Rx sent to pharmacy.

## 2017-05-08 ENCOUNTER — Ambulatory Visit (INDEPENDENT_AMBULATORY_CARE_PROVIDER_SITE_OTHER): Payer: PPO | Admitting: Internal Medicine

## 2017-05-08 ENCOUNTER — Encounter: Payer: Self-pay | Admitting: Internal Medicine

## 2017-05-08 ENCOUNTER — Ambulatory Visit: Payer: PPO

## 2017-05-08 DIAGNOSIS — E1165 Type 2 diabetes mellitus with hyperglycemia: Secondary | ICD-10-CM | POA: Diagnosis not present

## 2017-05-08 DIAGNOSIS — J449 Chronic obstructive pulmonary disease, unspecified: Secondary | ICD-10-CM

## 2017-05-08 DIAGNOSIS — E1140 Type 2 diabetes mellitus with diabetic neuropathy, unspecified: Secondary | ICD-10-CM

## 2017-05-08 DIAGNOSIS — IMO0002 Reserved for concepts with insufficient information to code with codable children: Secondary | ICD-10-CM

## 2017-05-08 MED ORDER — INSULIN DETEMIR 100 UNIT/ML FLEXPEN: 48.0000 [IU] | PEN_INJECTOR | Freq: Two times a day (BID) | SUBCUTANEOUS | 10 refills | Status: DC

## 2017-05-08 MED ORDER — ONETOUCH VERIO W/DEVICE KIT: 1.0000 | PACK | Freq: Once | 0 refills | Status: AC

## 2017-05-08 NOTE — Progress Notes (Signed)
   [name] Kerrin Mo, MD Phone: [phone]  Reason For Visit: Diabetes   CHRONIC DM, Type 2: Would like a new meter  Insulin regimen per patient includes NovoLog twice daily 36 units and Levemir twice daily 54 units. - miss 3-4 dose every 4 for both long term and short term insulin  He generally does not check his blood sugars except maybe once a day, indicates checking them at night. Insulin Regiment  10 AM Levemir  12:30 PM Novolog - eats breakfast  7 PM Levemir  8 PM Novolog  - eats dinner  Checks CBG Reports good compliance. Tolerating well w/o side-effects Lifestyle: Immobile in a wheelchair, significant elep Indicates having one episode of hypoglycemia about every month - CBGs of 45  Denies polyuria, visual changes, numbness or tingling.  COPD - Noted on problem list, no PFTs, no hx of smoking. Patient provided with Flovent and albuterol several years ago for allergies. Denies any hx of COPD, does not use CPAP machine. Likely with some obesity associated hypoventilation syndrome, however patient sleeps sitting up.   Past Medical History Reviewed problem list.  Medications- reviewed and updated No additions to family history Social history- patient is a non- smoker  Objective: BP 134/62   Pulse 97   Temp 98.2 F (36.8 C) (Oral)   Ht 6' (1.829 m)   SpO2 97%  Gen: NAD, alert, cooperative with exam Cardio: regular rate and rhythm, systolic murmur  Pulm: clear to auscultation bilaterally, no wheezes, rhonchi or rales  Assessment/Plan: See problem based a/p  DM type 2, uncontrolled, with neuropathy (HCC) A1C 14.3, March 2018. Current regiment per patient - Levemir 54 units BID and Novolog 36 units BID, not consistently compliant- miss several doses a week.  - Discussed trying to get patient to check blood sugars in the morning, he states that he will try.  - Will decrease Levemir to 48 units twice a day given patient's history of CBGs of  45 about once a month at night around 2/3 AM  -Patient to call if he has CBGs less than 70 -Will have him follow up in 2 weeks - Consider foot exam and opthalmology exam at next visit   COPD, severity to be determined ([place]) No history of abnormal PFTs, no smoking history, patient likely with some level of obesity associated hypoventilation syndrome. However I do not believe he has COPD. Patient was given Flovent for allergies he states several years ago and has been on it ever since. -Will stop Flovent and resolve this issue of COPD

## 2017-05-08 NOTE — Assessment & Plan Note (Signed)
A1C 14.3, March 2018. Current regiment per patient - Levemir 54 units BID and Novolog 36 units BID, not consistently compliant- miss several doses a week.  - Discussed trying to get patient to check blood sugars in the morning, he states that he will try.  - Will decrease Levemir to 48 units twice a day given patient's history of CBGs of 45 about once a month at night around 2/3 AM  -Patient to call if he has CBGs less than 70 -Will have him follow up in 2 weeks - Consider foot exam and opthalmology exam at next visit

## 2017-05-08 NOTE — Patient Instructions (Addendum)
I want you to stop the Flovent. You can continue the Nasonex spray. I don't believe that you need this medication. I also want to to decrease the amount of Levemir that you're taking to 48 units. Please try to check your blood sugars in the morning. I will prescribe you a new blood sugar meter. You'll can obtain this from the pharmacy. Please follow up with me in two weeks. Let me know if you have any episodes of hypoglycemia, blood sugar less than 70

## 2017-05-09 ENCOUNTER — Other Ambulatory Visit: Payer: Self-pay

## 2017-05-09 NOTE — Assessment & Plan Note (Signed)
No history of abnormal PFTs, no smoking history, patient likely with some level of obesity associated hypoventilation syndrome. However I do not believe he has COPD. Patient was given Flovent for allergies he states several years ago and has been on it ever since. -Will stop Flovent and resolve this issue of COPD

## 2017-05-10 NOTE — Patient Outreach (Signed)
     Home visit to assess needs for further diabetic education, need for community care coordination needs. Patient reports to this RNCM he does not need any additional services.  Plan: Discharge this case from my caseload, patient denies need for further case management services.

## 2017-05-21 NOTE — Telephone Encounter (Signed)
This encounter was created in error - please disregard.

## 2017-06-05 ENCOUNTER — Other Ambulatory Visit: Payer: Self-pay | Admitting: Family Medicine

## 2017-06-05 DIAGNOSIS — IMO0002 Reserved for concepts with insufficient information to code with codable children: Secondary | ICD-10-CM

## 2017-06-05 DIAGNOSIS — E1165 Type 2 diabetes mellitus with hyperglycemia: Principal | ICD-10-CM

## 2017-06-05 DIAGNOSIS — E1140 Type 2 diabetes mellitus with diabetic neuropathy, unspecified: Secondary | ICD-10-CM

## 2017-06-10 ENCOUNTER — Other Ambulatory Visit: Payer: Self-pay | Admitting: Family Medicine

## 2017-06-10 ENCOUNTER — Other Ambulatory Visit: Payer: Self-pay | Admitting: Internal Medicine

## 2017-06-10 ENCOUNTER — Other Ambulatory Visit (INDEPENDENT_AMBULATORY_CARE_PROVIDER_SITE_OTHER): Payer: Self-pay | Admitting: Family

## 2017-06-11 NOTE — Telephone Encounter (Signed)
I called rx into patients pharmacy.

## 2017-07-03 ENCOUNTER — Telehealth: Payer: Self-pay | Admitting: Internal Medicine

## 2017-07-03 NOTE — Telephone Encounter (Signed)
Needs Rx for leg rests for his wheelchair.  Needs to go to [facility].  Fax [phone]

## 2017-07-09 ENCOUNTER — Other Ambulatory Visit (INDEPENDENT_AMBULATORY_CARE_PROVIDER_SITE_OTHER): Payer: Self-pay | Admitting: Family

## 2017-07-09 ENCOUNTER — Other Ambulatory Visit: Payer: Self-pay | Admitting: Internal Medicine

## 2017-07-10 NOTE — Telephone Encounter (Signed)
Placed order for leg rests

## 2017-07-17 NOTE — Telephone Encounter (Signed)
Pt checked on his leg rests.  [facility] says they dont have the request.  Please advise

## 2017-07-30 NOTE — Telephone Encounter (Signed)
[facility] states they do not have the Rx for the leg rests for the powerwheel chair. Please fax [phone]. ep

## 2017-07-31 NOTE — Telephone Encounter (Signed)
Provided a new prescription. Second attempt. Asha to fax this prescription today.

## 2017-07-31 NOTE — Telephone Encounter (Signed)
Rx faxed

## 2017-08-06 ENCOUNTER — Other Ambulatory Visit (INDEPENDENT_AMBULATORY_CARE_PROVIDER_SITE_OTHER): Payer: Self-pay | Admitting: Family

## 2017-08-06 ENCOUNTER — Other Ambulatory Visit (INDEPENDENT_AMBULATORY_CARE_PROVIDER_SITE_OTHER): Payer: Self-pay | Admitting: Orthopedic Surgery

## 2017-08-06 ENCOUNTER — Other Ambulatory Visit: Payer: Self-pay | Admitting: Internal Medicine

## 2017-08-06 DIAGNOSIS — IMO0002 Reserved for concepts with insufficient information to code with codable children: Secondary | ICD-10-CM

## 2017-08-06 DIAGNOSIS — E1140 Type 2 diabetes mellitus with diabetic neuropathy, unspecified: Secondary | ICD-10-CM

## 2017-08-06 DIAGNOSIS — E1165 Type 2 diabetes mellitus with hyperglycemia: Principal | ICD-10-CM

## 2017-08-07 DIAGNOSIS — Z7409 Other reduced mobility: Secondary | ICD-10-CM | POA: Diagnosis not present

## 2017-08-07 DIAGNOSIS — M86669 Other chronic osteomyelitis, unspecified tibia and fibula: Secondary | ICD-10-CM | POA: Diagnosis not present

## 2017-08-07 NOTE — Telephone Encounter (Signed)
Ok refill? 

## 2017-08-07 NOTE — Telephone Encounter (Signed)
Please refill for pt.

## 2017-09-04 ENCOUNTER — Other Ambulatory Visit (INDEPENDENT_AMBULATORY_CARE_PROVIDER_SITE_OTHER): Payer: Self-pay | Admitting: Orthopedic Surgery

## 2017-09-04 ENCOUNTER — Other Ambulatory Visit: Payer: Self-pay | Admitting: Internal Medicine

## 2017-10-01 ENCOUNTER — Other Ambulatory Visit: Payer: Self-pay | Admitting: Internal Medicine

## 2017-10-01 ENCOUNTER — Other Ambulatory Visit (INDEPENDENT_AMBULATORY_CARE_PROVIDER_SITE_OTHER): Payer: Self-pay | Admitting: Orthopedic Surgery

## 2017-10-02 NOTE — Telephone Encounter (Signed)
Called into patients pharmacy.

## 2017-10-27 ENCOUNTER — Encounter (HOSPITAL_BASED_OUTPATIENT_CLINIC_OR_DEPARTMENT_OTHER): Payer: PPO | Attending: Internal Medicine

## 2017-10-27 DIAGNOSIS — E1142 Type 2 diabetes mellitus with diabetic polyneuropathy: Secondary | ICD-10-CM | POA: Insufficient documentation

## 2017-10-27 DIAGNOSIS — I890 Lymphedema, not elsewhere classified: Secondary | ICD-10-CM | POA: Diagnosis not present

## 2017-10-27 DIAGNOSIS — L97223 Non-pressure chronic ulcer of left calf with necrosis of muscle: Secondary | ICD-10-CM | POA: Diagnosis not present

## 2017-10-27 DIAGNOSIS — I87332 Chronic venous hypertension (idiopathic) with ulcer and inflammation of left lower extremity: Secondary | ICD-10-CM | POA: Insufficient documentation

## 2017-10-30 ENCOUNTER — Ambulatory Visit (HOSPITAL_COMMUNITY)
Admission: RE | Admit: 2017-10-30 | Discharge: 2017-10-30 | Disposition: A | Discharge status: Home / Self Care | Payer: PPO | Source: Ambulatory Visit | Attending: Vascular Surgery | Admitting: Vascular Surgery

## 2017-10-30 ENCOUNTER — Other Ambulatory Visit: Payer: Self-pay | Admitting: Internal Medicine

## 2017-10-30 ENCOUNTER — Other Ambulatory Visit (INDEPENDENT_AMBULATORY_CARE_PROVIDER_SITE_OTHER): Payer: Self-pay | Admitting: Orthopedic Surgery

## 2017-10-30 DIAGNOSIS — L98499 Non-pressure chronic ulcer of skin of other sites with unspecified severity: Secondary | ICD-10-CM

## 2017-10-30 DIAGNOSIS — E1165 Type 2 diabetes mellitus with hyperglycemia: Principal | ICD-10-CM

## 2017-10-30 DIAGNOSIS — E1140 Type 2 diabetes mellitus with diabetic neuropathy, unspecified: Secondary | ICD-10-CM

## 2017-10-30 DIAGNOSIS — IMO0002 Reserved for concepts with insufficient information to code with codable children: Secondary | ICD-10-CM

## 2017-10-31 NOTE — Telephone Encounter (Signed)
Ok refill? 

## 2017-11-03 ENCOUNTER — Encounter (HOSPITAL_BASED_OUTPATIENT_CLINIC_OR_DEPARTMENT_OTHER): Payer: PPO | Attending: Internal Medicine

## 2017-11-03 DIAGNOSIS — I890 Lymphedema, not elsewhere classified: Secondary | ICD-10-CM | POA: Insufficient documentation

## 2017-11-03 DIAGNOSIS — E1142 Type 2 diabetes mellitus with diabetic polyneuropathy: Secondary | ICD-10-CM | POA: Insufficient documentation

## 2017-11-03 DIAGNOSIS — L97223 Non-pressure chronic ulcer of left calf with necrosis of muscle: Secondary | ICD-10-CM | POA: Insufficient documentation

## 2017-11-03 DIAGNOSIS — I87332 Chronic venous hypertension (idiopathic) with ulcer and inflammation of left lower extremity: Secondary | ICD-10-CM | POA: Diagnosis not present

## 2017-11-03 DIAGNOSIS — I872 Venous insufficiency (chronic) (peripheral): Secondary | ICD-10-CM | POA: Diagnosis not present

## 2017-11-03 DIAGNOSIS — S81802A Unspecified open wound, left lower leg, initial encounter: Secondary | ICD-10-CM | POA: Diagnosis not present

## 2017-11-10 DIAGNOSIS — I890 Lymphedema, not elsewhere classified: Secondary | ICD-10-CM | POA: Diagnosis not present

## 2017-11-10 DIAGNOSIS — E11622 Type 2 diabetes mellitus with other skin ulcer: Secondary | ICD-10-CM | POA: Diagnosis not present

## 2017-11-10 DIAGNOSIS — L97222 Non-pressure chronic ulcer of left calf with fat layer exposed: Secondary | ICD-10-CM | POA: Diagnosis not present

## 2017-11-10 DIAGNOSIS — I87332 Chronic venous hypertension (idiopathic) with ulcer and inflammation of left lower extremity: Secondary | ICD-10-CM | POA: Diagnosis not present

## 2017-11-10 DIAGNOSIS — I872 Venous insufficiency (chronic) (peripheral): Secondary | ICD-10-CM | POA: Diagnosis not present

## 2017-11-17 DIAGNOSIS — L97222 Non-pressure chronic ulcer of left calf with fat layer exposed: Secondary | ICD-10-CM | POA: Diagnosis not present

## 2017-11-17 DIAGNOSIS — E11622 Type 2 diabetes mellitus with other skin ulcer: Secondary | ICD-10-CM | POA: Diagnosis not present

## 2017-11-17 DIAGNOSIS — I87332 Chronic venous hypertension (idiopathic) with ulcer and inflammation of left lower extremity: Secondary | ICD-10-CM | POA: Diagnosis not present

## 2017-11-24 DIAGNOSIS — L97222 Non-pressure chronic ulcer of left calf with fat layer exposed: Secondary | ICD-10-CM | POA: Diagnosis not present

## 2017-11-24 DIAGNOSIS — E11622 Type 2 diabetes mellitus with other skin ulcer: Secondary | ICD-10-CM | POA: Diagnosis not present

## 2017-11-24 DIAGNOSIS — I890 Lymphedema, not elsewhere classified: Secondary | ICD-10-CM | POA: Diagnosis not present

## 2017-11-24 DIAGNOSIS — I872 Venous insufficiency (chronic) (peripheral): Secondary | ICD-10-CM | POA: Diagnosis not present

## 2017-11-26 ENCOUNTER — Other Ambulatory Visit (INDEPENDENT_AMBULATORY_CARE_PROVIDER_SITE_OTHER): Payer: Self-pay | Admitting: Orthopedic Surgery

## 2017-11-26 ENCOUNTER — Other Ambulatory Visit: Payer: Self-pay | Admitting: Internal Medicine

## 2017-12-01 ENCOUNTER — Encounter (HOSPITAL_BASED_OUTPATIENT_CLINIC_OR_DEPARTMENT_OTHER): Payer: PPO | Attending: Internal Medicine

## 2017-12-01 DIAGNOSIS — I87332 Chronic venous hypertension (idiopathic) with ulcer and inflammation of left lower extremity: Secondary | ICD-10-CM | POA: Insufficient documentation

## 2017-12-01 DIAGNOSIS — E11622 Type 2 diabetes mellitus with other skin ulcer: Secondary | ICD-10-CM | POA: Diagnosis not present

## 2017-12-01 DIAGNOSIS — L97222 Non-pressure chronic ulcer of left calf with fat layer exposed: Secondary | ICD-10-CM | POA: Diagnosis not present

## 2017-12-01 DIAGNOSIS — I890 Lymphedema, not elsewhere classified: Secondary | ICD-10-CM | POA: Diagnosis not present

## 2017-12-01 DIAGNOSIS — L97229 Non-pressure chronic ulcer of left calf with unspecified severity: Secondary | ICD-10-CM | POA: Insufficient documentation

## 2017-12-01 DIAGNOSIS — E1142 Type 2 diabetes mellitus with diabetic polyneuropathy: Secondary | ICD-10-CM | POA: Diagnosis not present

## 2017-12-16 DIAGNOSIS — E11622 Type 2 diabetes mellitus with other skin ulcer: Secondary | ICD-10-CM | POA: Diagnosis not present

## 2017-12-16 DIAGNOSIS — I890 Lymphedema, not elsewhere classified: Secondary | ICD-10-CM | POA: Diagnosis not present

## 2017-12-16 DIAGNOSIS — L97222 Non-pressure chronic ulcer of left calf with fat layer exposed: Secondary | ICD-10-CM | POA: Diagnosis not present

## 2017-12-16 DIAGNOSIS — M21862 Other specified acquired deformities of left lower leg: Secondary | ICD-10-CM | POA: Diagnosis not present

## 2017-12-16 DIAGNOSIS — I87312 Chronic venous hypertension (idiopathic) with ulcer of left lower extremity: Secondary | ICD-10-CM | POA: Diagnosis not present

## 2017-12-24 ENCOUNTER — Other Ambulatory Visit (INDEPENDENT_AMBULATORY_CARE_PROVIDER_SITE_OTHER): Payer: Self-pay | Admitting: Orthopedic Surgery

## 2017-12-24 ENCOUNTER — Other Ambulatory Visit: Payer: Self-pay | Admitting: Internal Medicine

## 2017-12-24 DIAGNOSIS — IMO0002 Reserved for concepts with insufficient information to code with codable children: Secondary | ICD-10-CM

## 2017-12-24 DIAGNOSIS — E1165 Type 2 diabetes mellitus with hyperglycemia: Principal | ICD-10-CM

## 2017-12-24 DIAGNOSIS — E1140 Type 2 diabetes mellitus with diabetic neuropathy, unspecified: Secondary | ICD-10-CM

## 2017-12-26 DIAGNOSIS — L97222 Non-pressure chronic ulcer of left calf with fat layer exposed: Secondary | ICD-10-CM | POA: Diagnosis not present

## 2017-12-26 DIAGNOSIS — E11622 Type 2 diabetes mellitus with other skin ulcer: Secondary | ICD-10-CM | POA: Diagnosis not present

## 2017-12-26 DIAGNOSIS — I87332 Chronic venous hypertension (idiopathic) with ulcer and inflammation of left lower extremity: Secondary | ICD-10-CM | POA: Diagnosis not present

## 2017-12-26 DIAGNOSIS — I872 Venous insufficiency (chronic) (peripheral): Secondary | ICD-10-CM | POA: Diagnosis not present

## 2018-01-01 ENCOUNTER — Encounter (HOSPITAL_BASED_OUTPATIENT_CLINIC_OR_DEPARTMENT_OTHER): Payer: PPO | Attending: Internal Medicine

## 2018-01-01 DIAGNOSIS — E11622 Type 2 diabetes mellitus with other skin ulcer: Secondary | ICD-10-CM | POA: Diagnosis not present

## 2018-01-01 DIAGNOSIS — Z794 Long term (current) use of insulin: Secondary | ICD-10-CM | POA: Diagnosis not present

## 2018-01-01 DIAGNOSIS — I87332 Chronic venous hypertension (idiopathic) with ulcer and inflammation of left lower extremity: Secondary | ICD-10-CM | POA: Insufficient documentation

## 2018-01-01 DIAGNOSIS — L97222 Non-pressure chronic ulcer of left calf with fat layer exposed: Secondary | ICD-10-CM | POA: Diagnosis not present

## 2018-01-01 DIAGNOSIS — I890 Lymphedema, not elsewhere classified: Secondary | ICD-10-CM | POA: Diagnosis not present

## 2018-01-01 DIAGNOSIS — E1142 Type 2 diabetes mellitus with diabetic polyneuropathy: Secondary | ICD-10-CM | POA: Insufficient documentation

## 2018-01-08 DIAGNOSIS — L97222 Non-pressure chronic ulcer of left calf with fat layer exposed: Secondary | ICD-10-CM | POA: Diagnosis not present

## 2018-01-08 DIAGNOSIS — I890 Lymphedema, not elsewhere classified: Secondary | ICD-10-CM | POA: Diagnosis not present

## 2018-01-08 DIAGNOSIS — E11622 Type 2 diabetes mellitus with other skin ulcer: Secondary | ICD-10-CM | POA: Diagnosis not present

## 2018-01-08 DIAGNOSIS — I87332 Chronic venous hypertension (idiopathic) with ulcer and inflammation of left lower extremity: Secondary | ICD-10-CM | POA: Diagnosis not present

## 2018-01-15 DIAGNOSIS — I87322 Chronic venous hypertension (idiopathic) with inflammation of left lower extremity: Secondary | ICD-10-CM | POA: Diagnosis not present

## 2018-01-15 DIAGNOSIS — S81802A Unspecified open wound, left lower leg, initial encounter: Secondary | ICD-10-CM | POA: Diagnosis not present

## 2018-01-15 DIAGNOSIS — I890 Lymphedema, not elsewhere classified: Secondary | ICD-10-CM | POA: Diagnosis not present

## 2018-01-15 DIAGNOSIS — I87332 Chronic venous hypertension (idiopathic) with ulcer and inflammation of left lower extremity: Secondary | ICD-10-CM | POA: Diagnosis not present

## 2018-01-22 ENCOUNTER — Other Ambulatory Visit (INDEPENDENT_AMBULATORY_CARE_PROVIDER_SITE_OTHER): Payer: Self-pay | Admitting: Orthopedic Surgery

## 2018-01-22 ENCOUNTER — Other Ambulatory Visit: Payer: Self-pay | Admitting: Internal Medicine

## 2018-01-22 DIAGNOSIS — E1165 Type 2 diabetes mellitus with hyperglycemia: Principal | ICD-10-CM

## 2018-01-22 DIAGNOSIS — IMO0002 Reserved for concepts with insufficient information to code with codable children: Secondary | ICD-10-CM

## 2018-01-22 DIAGNOSIS — E1140 Type 2 diabetes mellitus with diabetic neuropathy, unspecified: Secondary | ICD-10-CM

## 2018-01-26 NOTE — Telephone Encounter (Signed)
I called rx into [facility].

## 2018-02-18 ENCOUNTER — Other Ambulatory Visit: Payer: Self-pay | Admitting: Internal Medicine

## 2018-02-18 ENCOUNTER — Telehealth: Payer: Self-pay

## 2018-02-18 ENCOUNTER — Other Ambulatory Visit (INDEPENDENT_AMBULATORY_CARE_PROVIDER_SITE_OTHER): Payer: Self-pay | Admitting: Orthopedic Surgery

## 2018-02-18 DIAGNOSIS — E1165 Type 2 diabetes mellitus with hyperglycemia: Principal | ICD-10-CM

## 2018-02-18 DIAGNOSIS — E1140 Type 2 diabetes mellitus with diabetic neuropathy, unspecified: Secondary | ICD-10-CM

## 2018-02-18 DIAGNOSIS — IMO0002 Reserved for concepts with insufficient information to code with codable children: Secondary | ICD-10-CM

## 2018-02-18 NOTE — Telephone Encounter (Signed)
Patient needs an order for wheelchair part/repair faxed to [facility] at [phone]. The left axle and ball bearing needs replacement. Danley Danker, RN [facility] RN)

## 2018-02-19 NOTE — Telephone Encounter (Signed)
Ok refill? 

## 2018-02-19 NOTE — Telephone Encounter (Signed)
Ok to refill 

## 2018-02-19 NOTE — Telephone Encounter (Signed)
Called into pharm  

## 2018-02-20 NOTE — Telephone Encounter (Signed)
Sent in prescription 

## 2018-03-12 ENCOUNTER — Telehealth: Payer: Self-pay

## 2018-03-13 NOTE — Telephone Encounter (Signed)
Created in error

## 2018-03-18 ENCOUNTER — Other Ambulatory Visit: Payer: Self-pay | Admitting: Internal Medicine

## 2018-03-18 ENCOUNTER — Other Ambulatory Visit (INDEPENDENT_AMBULATORY_CARE_PROVIDER_SITE_OTHER): Payer: Self-pay | Admitting: Orthopedic Surgery

## 2018-04-08 ENCOUNTER — Telehealth: Payer: Self-pay

## 2018-04-08 NOTE — Telephone Encounter (Signed)
Pt called states he needs a new battery for his electric wheelchair.  His call back [phone] Wallace Cullens, RN

## 2018-04-10 NOTE — Telephone Encounter (Signed)
Patient calling again about this. Prescription needs to be faxed to [facility] at [phone].  Patient callback is [phone].  Danley Danker, RN [facility] RN)

## 2018-04-13 NOTE — Telephone Encounter (Signed)
I have faxed in the prescription.

## 2018-04-15 ENCOUNTER — Other Ambulatory Visit (INDEPENDENT_AMBULATORY_CARE_PROVIDER_SITE_OTHER): Payer: Self-pay | Admitting: Orthopedic Surgery

## 2018-04-15 ENCOUNTER — Other Ambulatory Visit: Payer: Self-pay | Admitting: Internal Medicine

## 2018-04-16 ENCOUNTER — Other Ambulatory Visit: Payer: Self-pay

## 2018-04-16 NOTE — Telephone Encounter (Signed)
Ok refill? 

## 2018-04-16 NOTE — Telephone Encounter (Signed)
Refill

## 2018-05-01 DIAGNOSIS — Z7409 Other reduced mobility: Secondary | ICD-10-CM | POA: Diagnosis not present

## 2018-05-01 DIAGNOSIS — M86669 Other chronic osteomyelitis, unspecified tibia and fibula: Secondary | ICD-10-CM | POA: Diagnosis not present

## 2018-05-14 ENCOUNTER — Other Ambulatory Visit: Payer: Self-pay | Admitting: Internal Medicine

## 2018-05-14 ENCOUNTER — Other Ambulatory Visit (INDEPENDENT_AMBULATORY_CARE_PROVIDER_SITE_OTHER): Payer: Self-pay | Admitting: Orthopedic Surgery

## 2018-05-18 DIAGNOSIS — R0602 Shortness of breath: Secondary | ICD-10-CM | POA: Diagnosis not present

## 2018-05-19 ENCOUNTER — Emergency Department (HOSPITAL_COMMUNITY): Payer: PPO

## 2018-05-19 ENCOUNTER — Inpatient Hospital Stay (HOSPITAL_COMMUNITY): Payer: PPO

## 2018-05-19 ENCOUNTER — Encounter (HOSPITAL_COMMUNITY): Payer: Self-pay

## 2018-05-19 ENCOUNTER — Inpatient Hospital Stay (HOSPITAL_COMMUNITY)
Admission: EM | Admit: 2018-05-19 | Discharge: 2018-06-02 | DRG: 291 | Disposition: A | Discharge status: Home Health | Payer: PPO | Attending: Family Medicine | Admitting: Family Medicine

## 2018-05-19 ENCOUNTER — Other Ambulatory Visit (HOSPITAL_COMMUNITY): Payer: PPO

## 2018-05-19 ENCOUNTER — Other Ambulatory Visit: Payer: Self-pay

## 2018-05-19 DIAGNOSIS — R0602 Shortness of breath: Secondary | ICD-10-CM

## 2018-05-19 DIAGNOSIS — R0689 Other abnormalities of breathing: Secondary | ICD-10-CM | POA: Diagnosis not present

## 2018-05-19 DIAGNOSIS — I959 Hypotension, unspecified: Secondary | ICD-10-CM | POA: Diagnosis not present

## 2018-05-19 DIAGNOSIS — R972 Elevated prostate specific antigen [PSA]: Secondary | ICD-10-CM | POA: Diagnosis not present

## 2018-05-19 DIAGNOSIS — Z79899 Other long term (current) drug therapy: Secondary | ICD-10-CM

## 2018-05-19 DIAGNOSIS — E1142 Type 2 diabetes mellitus with diabetic polyneuropathy: Secondary | ICD-10-CM | POA: Diagnosis present

## 2018-05-19 DIAGNOSIS — L97929 Non-pressure chronic ulcer of unspecified part of left lower leg with unspecified severity: Secondary | ICD-10-CM

## 2018-05-19 DIAGNOSIS — E1165 Type 2 diabetes mellitus with hyperglycemia: Secondary | ICD-10-CM | POA: Diagnosis present

## 2018-05-19 DIAGNOSIS — I872 Venous insufficiency (chronic) (peripheral): Secondary | ICD-10-CM

## 2018-05-19 DIAGNOSIS — Z602 Problems related to living alone: Secondary | ICD-10-CM | POA: Diagnosis not present

## 2018-05-19 DIAGNOSIS — Z9889 Other specified postprocedural states: Secondary | ICD-10-CM

## 2018-05-19 DIAGNOSIS — L602 Onychogryphosis: Secondary | ICD-10-CM | POA: Diagnosis not present

## 2018-05-19 DIAGNOSIS — K8020 Calculus of gallbladder without cholecystitis without obstruction: Secondary | ICD-10-CM | POA: Diagnosis not present

## 2018-05-19 DIAGNOSIS — M889 Osteitis deformans of unspecified bone: Secondary | ICD-10-CM | POA: Diagnosis present

## 2018-05-19 DIAGNOSIS — E785 Hyperlipidemia, unspecified: Secondary | ICD-10-CM | POA: Diagnosis not present

## 2018-05-19 DIAGNOSIS — R0600 Dyspnea, unspecified: Secondary | ICD-10-CM

## 2018-05-19 DIAGNOSIS — L89893 Pressure ulcer of other site, stage 3: Secondary | ICD-10-CM | POA: Diagnosis not present

## 2018-05-19 DIAGNOSIS — S82209A Unspecified fracture of shaft of unspecified tibia, initial encounter for closed fracture: Secondary | ICD-10-CM | POA: Diagnosis not present

## 2018-05-19 DIAGNOSIS — E8770 Fluid overload, unspecified: Secondary | ICD-10-CM

## 2018-05-19 DIAGNOSIS — J9601 Acute respiratory failure with hypoxia: Secondary | ICD-10-CM | POA: Diagnosis not present

## 2018-05-19 DIAGNOSIS — R269 Unspecified abnormalities of gait and mobility: Secondary | ICD-10-CM | POA: Diagnosis present

## 2018-05-19 DIAGNOSIS — B888 Other specified infestations: Secondary | ICD-10-CM | POA: Diagnosis present

## 2018-05-19 DIAGNOSIS — I5033 Acute on chronic diastolic (congestive) heart failure: Secondary | ICD-10-CM | POA: Diagnosis present

## 2018-05-19 DIAGNOSIS — Z6841 Body Mass Index (BMI) 40.0 and over, adult: Secondary | ICD-10-CM | POA: Diagnosis not present

## 2018-05-19 DIAGNOSIS — R0609 Other forms of dyspnea: Secondary | ICD-10-CM | POA: Diagnosis not present

## 2018-05-19 DIAGNOSIS — J449 Chronic obstructive pulmonary disease, unspecified: Secondary | ICD-10-CM | POA: Diagnosis not present

## 2018-05-19 DIAGNOSIS — Z7982 Long term (current) use of aspirin: Secondary | ICD-10-CM

## 2018-05-19 DIAGNOSIS — N179 Acute kidney failure, unspecified: Secondary | ICD-10-CM | POA: Diagnosis not present

## 2018-05-19 DIAGNOSIS — D638 Anemia in other chronic diseases classified elsewhere: Secondary | ICD-10-CM | POA: Diagnosis not present

## 2018-05-19 DIAGNOSIS — I5043 Acute on chronic combined systolic (congestive) and diastolic (congestive) heart failure: Secondary | ICD-10-CM | POA: Diagnosis not present

## 2018-05-19 DIAGNOSIS — T502X5A Adverse effect of carbonic-anhydrase inhibitors, benzothiadiazides and other diuretics, initial encounter: Secondary | ICD-10-CM | POA: Diagnosis not present

## 2018-05-19 DIAGNOSIS — Z8546 Personal history of malignant neoplasm of prostate: Secondary | ICD-10-CM

## 2018-05-19 DIAGNOSIS — I700 Atherosclerosis of aorta: Secondary | ICD-10-CM | POA: Diagnosis not present

## 2018-05-19 DIAGNOSIS — E662 Morbid (severe) obesity with alveolar hypoventilation: Secondary | ICD-10-CM | POA: Diagnosis not present

## 2018-05-19 DIAGNOSIS — I890 Lymphedema, not elsewhere classified: Secondary | ICD-10-CM

## 2018-05-19 DIAGNOSIS — R0902 Hypoxemia: Secondary | ICD-10-CM

## 2018-05-19 DIAGNOSIS — I517 Cardiomegaly: Secondary | ICD-10-CM | POA: Diagnosis not present

## 2018-05-19 DIAGNOSIS — C61 Malignant neoplasm of prostate: Secondary | ICD-10-CM | POA: Diagnosis present

## 2018-05-19 DIAGNOSIS — L89213 Pressure ulcer of right hip, stage 3: Secondary | ICD-10-CM

## 2018-05-19 DIAGNOSIS — IMO0002 Reserved for concepts with insufficient information to code with codable children: Secondary | ICD-10-CM

## 2018-05-19 DIAGNOSIS — I110 Hypertensive heart disease with heart failure: Principal | ICD-10-CM | POA: Diagnosis present

## 2018-05-19 DIAGNOSIS — Z888 Allergy status to other drugs, medicaments and biological substances status: Secondary | ICD-10-CM

## 2018-05-19 DIAGNOSIS — I358 Other nonrheumatic aortic valve disorders: Secondary | ICD-10-CM | POA: Diagnosis not present

## 2018-05-19 DIAGNOSIS — Z96643 Presence of artificial hip joint, bilateral: Secondary | ICD-10-CM | POA: Diagnosis not present

## 2018-05-19 DIAGNOSIS — Z9981 Dependence on supplemental oxygen: Secondary | ICD-10-CM | POA: Diagnosis not present

## 2018-05-19 DIAGNOSIS — Z7951 Long term (current) use of inhaled steroids: Secondary | ICD-10-CM

## 2018-05-19 DIAGNOSIS — K219 Gastro-esophageal reflux disease without esophagitis: Secondary | ICD-10-CM | POA: Diagnosis present

## 2018-05-19 DIAGNOSIS — B372 Candidiasis of skin and nail: Secondary | ICD-10-CM | POA: Diagnosis present

## 2018-05-19 DIAGNOSIS — R011 Cardiac murmur, unspecified: Secondary | ICD-10-CM

## 2018-05-19 DIAGNOSIS — I272 Pulmonary hypertension, unspecified: Secondary | ICD-10-CM | POA: Diagnosis present

## 2018-05-19 DIAGNOSIS — R0603 Acute respiratory distress: Secondary | ICD-10-CM | POA: Diagnosis not present

## 2018-05-19 DIAGNOSIS — E873 Alkalosis: Secondary | ICD-10-CM | POA: Diagnosis not present

## 2018-05-19 DIAGNOSIS — L304 Erythema intertrigo: Secondary | ICD-10-CM | POA: Diagnosis not present

## 2018-05-19 DIAGNOSIS — J9811 Atelectasis: Secondary | ICD-10-CM | POA: Diagnosis present

## 2018-05-19 DIAGNOSIS — R229 Localized swelling, mass and lump, unspecified: Secondary | ICD-10-CM

## 2018-05-19 DIAGNOSIS — I519 Heart disease, unspecified: Secondary | ICD-10-CM

## 2018-05-19 DIAGNOSIS — R05 Cough: Secondary | ICD-10-CM | POA: Diagnosis not present

## 2018-05-19 DIAGNOSIS — I5189 Other ill-defined heart diseases: Secondary | ICD-10-CM

## 2018-05-19 DIAGNOSIS — D509 Iron deficiency anemia, unspecified: Secondary | ICD-10-CM | POA: Diagnosis present

## 2018-05-19 DIAGNOSIS — E8779 Other fluid overload: Secondary | ICD-10-CM | POA: Diagnosis not present

## 2018-05-19 DIAGNOSIS — Z794 Long term (current) use of insulin: Secondary | ICD-10-CM

## 2018-05-19 DIAGNOSIS — K5900 Constipation, unspecified: Secondary | ICD-10-CM | POA: Diagnosis not present

## 2018-05-19 DIAGNOSIS — R221 Localized swelling, mass and lump, neck: Secondary | ICD-10-CM | POA: Diagnosis not present

## 2018-05-19 LAB — TROPONIN I
TROPONIN I: 0.05 ng/mL — AB (ref <0.03)
Troponin I: 0.04 ng/mL (ref <0.03)
Troponin I: 0.05 ng/mL (ref <0.03)

## 2018-05-19 LAB — GLUCOSE, CAPILLARY
Glucose-Capillary: 146 mg/dL — ABNORMAL HIGH (ref 65–99)
Glucose-Capillary: 115 mg/dL — ABNORMAL HIGH (ref 65–99)

## 2018-05-19 LAB — HEPATIC FUNCTION PANEL
ALT: 12 U/L — AB (ref 17–63)
AST: 17 U/L (ref 15–41)
Albumin: 2.6 g/dL — ABNORMAL LOW (ref 3.5–5.0)
Alkaline Phosphatase: 120 U/L (ref 38–126)
BILIRUBIN TOTAL: 0.5 mg/dL (ref 0.3–1.2)
Total Protein: 8.5 g/dL — ABNORMAL HIGH (ref 6.5–8.1)

## 2018-05-19 LAB — CBG MONITORING, ED
GLUCOSE-CAPILLARY: 199 mg/dL — AB (ref 65–99)
Glucose-Capillary: 196 mg/dL — ABNORMAL HIGH (ref 65–99)
Glucose-Capillary: 171 mg/dL — ABNORMAL HIGH (ref 65–99)

## 2018-05-19 LAB — CBC WITH DIFFERENTIAL/PLATELET
Abs Immature Granulocytes: 0 10*3/uL (ref 0.0–0.1)
Basophils Absolute: 0 10*3/uL (ref 0.0–0.1)
Basophils Relative: 0 %
EOS ABS: 0.4 10*3/uL (ref 0.0–0.7)
EOS PCT: 4 %
HEMATOCRIT: 30.4 % — AB (ref 39.0–52.0)
Hemoglobin: 9.2 g/dL — ABNORMAL LOW (ref 13.0–17.0)
Immature Granulocytes: 0 %
LYMPHS ABS: 1.2 10*3/uL (ref 0.7–4.0)
Lymphocytes Relative: 12 %
MCH: 25.4 pg — AB (ref 26.0–34.0)
MCHC: 30.3 g/dL (ref 30.0–36.0)
MCV: 84 fL (ref 78.0–100.0)
MONOS PCT: 10 %
Monocytes Absolute: 1 10*3/uL (ref 0.1–1.0)
Neutro Abs: 7.4 10*3/uL (ref 1.7–7.7)
Neutrophils Relative %: 74 %
Platelets: 290 10*3/uL (ref 150–400)
RBC: 3.62 MIL/uL — ABNORMAL LOW (ref 4.22–5.81)
RDW: 14.3 % (ref 11.5–15.5)
WBC: 10 10*3/uL (ref 4.0–10.5)

## 2018-05-19 LAB — PROTIME-INR
INR: 1.09
Prothrombin Time: 14 seconds (ref 11.4–15.2)

## 2018-05-19 LAB — LIPID PANEL
Cholesterol: 121 mg/dL (ref 0–200)
HDL: 40 mg/dL — ABNORMAL LOW
LDL Cholesterol: 70 mg/dL (ref 0–99)
Total CHOL/HDL Ratio: 3 ratio
Triglycerides: 54 mg/dL
VLDL: 11 mg/dL (ref 0–40)

## 2018-05-19 LAB — PSA: Prostatic Specific Antigen: 12.94 ng/mL — ABNORMAL HIGH (ref 0.00–4.00)

## 2018-05-19 LAB — BASIC METABOLIC PANEL
ANION GAP: 7 (ref 5–15)
BUN: 17 mg/dL (ref 6–20)
CALCIUM: 8.6 mg/dL — AB (ref 8.9–10.3)
CO2: 28 mmol/L (ref 22–32)
CREATININE: 0.99 mg/dL (ref 0.61–1.24)
Chloride: 105 mmol/L (ref 101–111)
GFR calc Af Amer: >60 mL/min (ref >60)
GFR calc non Af Amer: >60 mL/min (ref >60)
GLUCOSE: 176 mg/dL — AB (ref 65–99)
Potassium: 3.8 mmol/L (ref 3.5–5.1)
Sodium: 140 mmol/L (ref 135–145)

## 2018-05-19 LAB — I-STAT CHEM 8, ED
BUN: 20 mg/dL (ref 6–20)
CALCIUM ION: 1.15 mmol/L (ref 1.15–1.40)
Chloride: 101 mmol/L (ref 101–111)
Creatinine, Ser: 0.9 mg/dL (ref 0.61–1.24)
GLUCOSE: 179 mg/dL — AB (ref 65–99)
HCT: 29 % — ABNORMAL LOW (ref 39.0–52.0)
HEMOGLOBIN: 9.9 g/dL — AB (ref 13.0–17.0)
POTASSIUM: 3.7 mmol/L (ref 3.5–5.1)
SODIUM: 141 mmol/L (ref 135–145)
TCO2: 29 mmol/L (ref 22–32)

## 2018-05-19 LAB — FERRITIN: FERRITIN: 191 ng/mL (ref 24–336)

## 2018-05-19 LAB — IRON AND TIBC
Iron: 15 ug/dL — ABNORMAL LOW (ref 45–182)
Saturation Ratios: 7 % — ABNORMAL LOW (ref 17.9–39.5)
TIBC: 213 ug/dL — ABNORMAL LOW (ref 250–450)
UIBC: 198 ug/dL

## 2018-05-19 LAB — I-STAT TROPONIN, ED: TROPONIN I, POC: 0.02 ng/mL (ref 0.00–0.08)

## 2018-05-19 LAB — BRAIN NATRIURETIC PEPTIDE: B Natriuretic Peptide: 43.8 pg/mL (ref 0.0–100.0)

## 2018-05-19 LAB — HEMOGLOBIN A1C
Hgb A1c MFr Bld: 9.3 % — ABNORMAL HIGH (ref 4.8–5.6)
Mean Plasma Glucose: 220.21 mg/dL

## 2018-05-19 MED ORDER — NYSTATIN 100000 UNIT/GM EX POWD
Freq: Two times a day (BID) | CUTANEOUS | Status: DC
  Administered 2018-05-19 – 2018-06-02 (×24): via TOPICAL
  Filled 2018-05-19 (×2): qty 15

## 2018-05-19 MED ORDER — FLUTICASONE PROPIONATE HFA 220 MCG/ACT IN AERO: 1.0000 | INHALATION_SPRAY | Freq: Two times a day (BID) | RESPIRATORY_TRACT | Status: DC

## 2018-05-19 MED ORDER — INSULIN DETEMIR 100 UNIT/ML FLEXPEN: 48.0000 [IU] | PEN_INJECTOR | Freq: Two times a day (BID) | SUBCUTANEOUS | Status: DC

## 2018-05-19 MED ORDER — FUROSEMIDE 10 MG/ML IJ SOLN
40.0000 mg | Freq: Two times a day (BID) | INTRAMUSCULAR | Status: DC
  Administered 2018-05-19 – 2018-05-20 (×3): 40 mg via INTRAVENOUS
  Filled 2018-05-19 (×3): qty 4

## 2018-05-19 MED ORDER — PANTOPRAZOLE SODIUM 40 MG PO TBEC
80.0000 mg | DELAYED_RELEASE_TABLET | Freq: Every day | ORAL | Status: DC
  Administered 2018-05-19 – 2018-06-02 (×15): 80 mg via ORAL
  Filled 2018-05-19 (×15): qty 2

## 2018-05-19 MED ORDER — AMMONIUM LACTATE 12 % EX LOTN
TOPICAL_LOTION | Freq: Every morning | CUTANEOUS | Status: DC
  Administered 2018-05-19 – 2018-05-22 (×4): via TOPICAL
  Administered 2018-05-23: 1 via TOPICAL
  Administered 2018-05-24 – 2018-06-02 (×10): via TOPICAL
  Filled 2018-05-19 (×2): qty 225

## 2018-05-19 MED ORDER — PRAVASTATIN SODIUM 40 MG PO TABS
40.0000 mg | ORAL_TABLET | Freq: Every day | ORAL | Status: DC
Start: 2018-05-19 — End: 2018-06-02
  Administered 2018-05-19 – 2018-06-02 (×15): 40 mg via ORAL
  Filled 2018-05-19 (×15): qty 1

## 2018-05-19 MED ORDER — TRIAMTERENE-HCTZ 37.5-25 MG PO TABS
1.0000 | ORAL_TABLET | Freq: Every day | ORAL | Status: DC
  Administered 2018-05-19 – 2018-05-27 (×9): 1 via ORAL
  Filled 2018-05-19 (×9): qty 1

## 2018-05-19 MED ORDER — IOPAMIDOL (ISOVUE-370) INJECTION 76%
100.0000 mL | Freq: Once | INTRAVENOUS | Status: AC | PRN
  Administered 2018-05-19: 100 mL via INTRAVENOUS

## 2018-05-19 MED ORDER — ENOXAPARIN SODIUM 80 MG/0.8ML ~~LOC~~ SOLN
0.5000 mg/kg | SUBCUTANEOUS | Status: DC
  Administered 2018-05-19 – 2018-05-27 (×9): 75 mg via SUBCUTANEOUS
  Filled 2018-05-19: qty 0.75
  Filled 2018-05-19 (×8): qty 0.8

## 2018-05-19 MED ORDER — IOPAMIDOL (ISOVUE-370) INJECTION 76%: 100.0000 mL | Freq: Once | INTRAVENOUS | Status: DC | PRN

## 2018-05-19 MED ORDER — ACETAMINOPHEN 650 MG RE SUPP: 650.0000 mg | Freq: Four times a day (QID) | RECTAL | Status: DC | PRN

## 2018-05-19 MED ORDER — IOPAMIDOL (ISOVUE-370) INJECTION 76%
INTRAVENOUS | Status: AC
  Filled 2018-05-19: qty 100

## 2018-05-19 MED ORDER — ASPIRIN 81 MG PO CHEW
81.0000 mg | CHEWABLE_TABLET | ORAL | Status: DC
  Administered 2018-05-19 – 2018-06-02 (×8): 81 mg via ORAL
  Filled 2018-05-19 (×11): qty 1

## 2018-05-19 MED ORDER — ALBUTEROL SULFATE (2.5 MG/3ML) 0.083% IN NEBU
2.5000 mg | INHALATION_SOLUTION | RESPIRATORY_TRACT | Status: DC | PRN
  Administered 2018-06-02: 2.5 mg via RESPIRATORY_TRACT
  Filled 2018-05-19: qty 3

## 2018-05-19 MED ORDER — INSULIN ASPART 100 UNIT/ML ~~LOC~~ SOLN
0.0000 [IU] | SUBCUTANEOUS | Status: DC
  Administered 2018-05-19: 3 [IU] via SUBCUTANEOUS
  Administered 2018-05-19: 100 [IU] via SUBCUTANEOUS
  Administered 2018-05-19 – 2018-05-20 (×2): 4 [IU] via SUBCUTANEOUS
  Administered 2018-05-21: 7 [IU] via SUBCUTANEOUS

## 2018-05-19 MED ORDER — ACETAMINOPHEN 325 MG PO TABS
650.0000 mg | ORAL_TABLET | Freq: Four times a day (QID) | ORAL | Status: DC | PRN
  Filled 2018-05-19: qty 2

## 2018-05-19 MED ORDER — INSULIN DETEMIR 100 UNIT/ML ~~LOC~~ SOLN
48.0000 [IU] | Freq: Two times a day (BID) | SUBCUTANEOUS | Status: DC
  Administered 2018-05-19 – 2018-05-21 (×4): 48 [IU] via SUBCUTANEOUS
  Filled 2018-05-19 (×9): qty 0.48

## 2018-05-19 MED ORDER — METOPROLOL TARTRATE 50 MG PO TABS
50.0000 mg | ORAL_TABLET | Freq: Two times a day (BID) | ORAL | Status: DC
  Administered 2018-05-19 – 2018-05-29 (×18): 50 mg via ORAL
  Filled 2018-05-19 (×20): qty 1
  Filled 2018-05-19: qty 2

## 2018-05-19 MED ORDER — ORAL CARE MOUTH RINSE
15.0000 mL | Freq: Two times a day (BID) | OROMUCOSAL | Status: DC
  Administered 2018-05-20 – 2018-05-23 (×6): 15 mL via OROMUCOSAL

## 2018-05-19 MED ORDER — BUDESONIDE 0.5 MG/2ML IN SUSP
0.5000 mg | Freq: Two times a day (BID) | RESPIRATORY_TRACT | Status: DC
  Administered 2018-05-19 – 2018-06-01 (×19): 0.5 mg via RESPIRATORY_TRACT
  Filled 2018-05-19 (×25): qty 2

## 2018-05-19 MED ORDER — ONDANSETRON HCL 4 MG/2ML IJ SOLN
4.0000 mg | Freq: Four times a day (QID) | INTRAMUSCULAR | Status: DC | PRN
  Administered 2018-05-21: 4 mg via INTRAVENOUS
  Filled 2018-05-19: qty 2

## 2018-05-19 MED ORDER — TAMSULOSIN HCL 0.4 MG PO CAPS
0.8000 mg | ORAL_CAPSULE | Freq: Every morning | ORAL | Status: DC
  Administered 2018-05-19 – 2018-06-01 (×14): 0.8 mg via ORAL
  Filled 2018-05-19 (×16): qty 2

## 2018-05-19 MED ORDER — LATANOPROST 0.005 % OP SOLN
1.0000 [drp] | Freq: Every day | OPHTHALMIC | Status: DC
  Administered 2018-05-20 – 2018-06-01 (×13): 1 [drp] via OPHTHALMIC
  Filled 2018-05-19 (×2): qty 2.5

## 2018-05-19 MED ORDER — FUROSEMIDE 10 MG/ML IJ SOLN
40.0000 mg | Freq: Once | INTRAMUSCULAR | Status: AC
  Administered 2018-05-19: 40 mg via INTRAVENOUS
  Filled 2018-05-19: qty 4

## 2018-05-19 MED ORDER — ONDANSETRON HCL 4 MG PO TABS: 4.0000 mg | ORAL_TABLET | Freq: Four times a day (QID) | ORAL | Status: DC | PRN

## 2018-05-19 NOTE — ED Notes (Signed)
Monitor applied to pt.  

## 2018-05-19 NOTE — Progress Notes (Signed)
I was informed about elevated troponin (0.04). We are trending. Per discussion with the resident and Dr. Wendy Poet, he does not have chest pain; this might be due to demand ischemia.  We will trend and keep close eye on patient.  Resident (Dr. Vanetta Shawl) informed as well to monitor patient closely.

## 2018-05-19 NOTE — ED Notes (Signed)
Paged admitting to RN

## 2018-05-19 NOTE — ED Notes (Signed)
Admitting MD aware of Trop 0.04.

## 2018-05-19 NOTE — ED Notes (Addendum)
Pt being transported to 5M22 via bed w/O2 infusing at 2L/min via n/c and telemetry. Eyeglasses and dressing to leg being sent w/pt.

## 2018-05-19 NOTE — ED Notes (Signed)
Admitting paged to RN Becky per her request regarding a critical lab

## 2018-05-19 NOTE — Progress Notes (Signed)
Dr. Gwendlyn Deutscher changing pts level of care to telemetry bed.

## 2018-05-19 NOTE — ED Notes (Signed)
Pts family member at bedside

## 2018-05-19 NOTE — Consult Note (Signed)
[place] Nurse wound consult note Reason for Consult: Bilateral lower leg lymphedema Wound type: superficial weeping areas to posterior left calf.  Related to lyphedema POA: Yes Scattered areas of superficial open, weeping wounds are present to the posterior left calf area.  The patient states he has not been to the lymphedema clinic in months.  He states the wounds began and the weeping began about 2 weeks ago.  The plan for the bilateral lower legs includes:  Wash with warm soap and water.  Pat dry.  Apply LacHydrin lotion to both legs.  For any open areas (currently just the posterior left calf area), apply two pads of Aquacel Ag+ hydrofiber Kellie Simmering [phone]).  Then place ABD pads over the Aquacel.  Wrap both lower extremities (from just behind the toes to just below the knees) in multiple rolls of Kerlex.  Then wrap 4 inch ACE wraps over the kerlex.  Change daily. Monitor the wound area(s) for worsening of condition such as: Signs/symptoms of infection,  Increase in size,  Development of or worsening of odor, Development of pain, or increased pain at the affected locations.  Notify the medical team if any of these develop.  Thank you for the consult.  Discussed plan of care with the patient and bedside nurse.  [place] nurse will not follow at this time.  Please re-consult the [place] team if needed.  Val Riles, RN, MSN, CWOCN, CNS-BC, pager [phone]

## 2018-05-19 NOTE — Progress Notes (Signed)
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 Pt just arrived to floor from ED. RN waiting for MD to bedside, reports pt is in no distress just wanted me to put pt on radar list and report off to night shift RRT. Bedside stated I was not needed at this time, will keep RRT updated.

## 2018-05-19 NOTE — ED Provider Notes (Addendum)
[facility] EMERGENCY DEPARTMENT Provider Note   CSN: [id number] Arrival date & time: 05/19/18  0047     History   Chief Complaint No chief complaint on file.   HPI [name] is a 77 y.o. male.  77 yo M with a chief complaint of shortness of breath.  This been going on for the past 3 days.  He is having some cough and coughing up some sputum.  He denies fevers or chills.  Denies chest pain.  Feels that the swelling in his legs which is chronic is gotten worse over the past couple days.  Also feels that his abdomen is slightly more swollen than normal.  Denies a history of heart failure.  Denies sick contacts.  The history is provided by the patient.  Shortness of Breath  This is a new problem. The average episode lasts 3 days. The problem occurs continuously.The current episode started more than 1 week ago. The problem has not changed since onset.Associated symptoms include leg swelling. Pertinent negatives include no fever, no headaches, no chest pain, no vomiting, no abdominal pain and no rash. He has tried nothing for the symptoms. The treatment provided no relief. He has had no prior hospitalizations. He has had no prior ED visits. He has had no prior ICU admissions. Associated medical issues do not include asthma, COPD, PE or heart failure.    Past Medical History:  Diagnosis Date  . COLON POLYP 02/26/2007   Qualifier: Diagnosis of  By: Erling Cruz  MD, MELISSA    . Complication of anesthesia   . Diabetes mellitus   . GERD (gastroesophageal reflux disease)   . Heart murmur    last 2D Echo -03/31/08  . HIATAL HERNIA WITH REFLUX 09/29/2006   Qualifier: Diagnosis of  By: Erling Cruz  MD, MELISSA    . HIP REPLACEMENT, BILATERAL, HX OF 05/12/2007   Qualifier: Diagnosis of  By: Erling Cruz  MD, MELISSA    . History of blood transfusion   . Hypertension   . OSTEOARTHRITIS 03/05/2007   Qualifier: Diagnosis of  By: Erling Cruz  MD, MELISSA    . PONV (postoperative nausea and vomiting)   .  Prostate cancer ([place])   . SBO (small bowel obstruction) ([place]) 02/2017  . Seasonal allergies   . VENOUS INSUFFICIENCY, CHRONIC 02/26/2007   Qualifier: Diagnosis of  By: Erling Cruz  MD, MELISSA      Patient Active Problem List   Diagnosis Date Noted  . SBO (small bowel obstruction) ([place]) 03/15/2017  . Elephantiasis nostras 03/29/2016  . Impaired mobility 09/09/2014  . Diabetic peripheral neuropathy associated with type 2 diabetes mellitus ([place]) 02/07/2014  . Ulcer of left lower leg ([place]) 10/05/2012  . Anemia 05/21/2012  . Chronic leg pain 11/17/2011  . Chronic osteomyelitis of lower leg ([place]) 11/20/2010  . HLD (hyperlipidemia) 12/01/2008  . ADENOCARCINOMA, PROSTATE 05/24/2008  . DM type 2, uncontrolled, with neuropathy ([place]) 01/20/2008  . Morbid obesity with body mass index of 50.0-59.9 in adult ([place]) 03/05/2007  . Essential hypertension 03/05/2007    Past Surgical History:  Procedure Laterality Date  . COLONOSCOPY    . COLONOSCOPY    . HIP ARTHROPLASTY     bil  . I&D EXTREMITY  08/14/2012   Procedure: IRRIGATION AND DEBRIDEMENT EXTREMITY;  Surgeon: Newt Minion, MD;  Location: [facility];  Service: Orthopedics;  Laterality: Right;  Irrigation and Debridement Right tibia, Placement antibiotic beads  . I&D EXTREMITY Left 03/18/2014   Procedure: IRRIGATION AND DEBRIDEMENT EXTREMITY;  Surgeon: Newt Minion, MD;  Location: [facility];  Service: Orthopedics;  Laterality: Left;  Debridement Left Calf Ulcer, Apply Theraskin and Wound VAC  . IRRIGATION AND DEBRIDEMENT ABSCESS Left 03/18/2014   DR DUDA  . LEG SURGERY     ROD for fracture  . LEG WOUND REPAIR / CLOSURE     Beads .  Skin wound  . SKIN GRAFT     right leg- from donor skin  . SKIN GRAFT  1976   R wrist   skin graft from left thigh  . UPPER GASTROINTESTINAL ENDOSCOPY    . WISDOM TOOTH EXTRACTION    . WRIST FUSION     right wrist        Home Medications    Prior to Admission medications   Medication Sig Start Date End Date  Taking? Authorizing Provider  aspirin 81 MG chewable tablet Chew 81 mg by mouth every other day.     [provider]  EASY TOUCH PEN NEEDLES 31G X 8 MM MISC USE WITH FLEXPEN AS DIRECTED 01/23/18   Mikell, Jeani Sow, MD  esomeprazole (NEXIUM) 40 MG capsule TAKE 1 CAPSULE BY MOUTH EVERY DAY 03/19/18   Mikell, Jeani Sow, MD  FLOVENT HFA 220 MCG/ACT inhaler INHALE 1 PUFF INTO THE LUNGS TWICE DAILY 10/02/17   Mikell, Jeani Sow, MD  fluticasone (CUTIVATE) 0.05 % cream Apply 1 application topically 2 (two) times daily. Reported on 05/23/2016    [provider]  furosemide (LASIX) 40 MG tablet TAKE 1 TABLET BY MOUTH TWICE DAILY. 04/18/18   Mikell, Jeani Sow, MD  Insulin Detemir (LEVEMIR FLEXTOUCH) 100 UNIT/ML Pen Inject 48 Units into the skin 2 (two) times daily. 05/08/17   Mikell, Jeani Sow, MD  Lancet Devices (ONE TOUCH DELICA LANCING DEV) MISC 75 Units by Does not apply route 2 (two) times daily. 04/11/16   Rosemarie Ax, MD  latanoprost (XALATAN) 0.005 % ophthalmic solution INSTILL 1 DROP IN BOTH EYES EVERY EVENING FOR GLAUCOMA 12/07/15   Rosemarie Ax, MD  latanoprost (XALATAN) 0.005 % ophthalmic solution INSTILL 1 DROP IN BOTH EYES EVERY EVENING FOR GLAUCOMA 02/19/18   Mikell, Jeani Sow, MD  LEVEMIR FLEXTOUCH 100 UNIT/ML Pen INJECT 50 UNITS SUBCUTANEOUSLY TWICE DAILY 02/19/18   Mikell, Jeani Sow, MD  metoprolol tartrate (LOPRESSOR) 100 MG tablet TAKE 1/2 TABLET BY MOUTH 2 TIMES DAILY. 01/23/18   Mikell, Jeani Sow, MD  mometasone (NASONEX) 50 MCG/ACT nasal spray INSTILL 2 SPRAYS INTO THE NOSE DAILY 03/19/18   Mikell, Jeani Sow, MD  mupirocin ointment (BACTROBAN) 2 % Apply 1 application topically 2 (two) times daily. To legs    [provider]  NOVOLOG FLEXPEN 100 UNIT/ML FlexPen INJECT 45 UNITS SUBCUTANEOUSLY TWICE DAILY 02/19/18   Mikell, Jeani Sow, MD  ONE TOUCH ULTRA TEST test strip USE TO CHECK BLOOD SUGAR FOUR TIMES A DAY 09/22/15   Rosemarie Ax, MD  ONE [facility] ULTRA TEST test strip USE TO TEST BLOOD SUGAR TWICE DAILY 02/19/18   Mikell, Jeani Sow, MD  [facility] DELICA LANCETS 25E MISC USE TO CHECK BLOOD SUGAR TWICE DAILY. 08/07/17   Mikell, Jeani Sow, MD  pravastatin (PRAVACHOL) 40 MG tablet TAKE 1 TABLET BY MOUTH DAILY 12/26/17   Mikell, Jeani Sow, MD  PROAIR HFA 108 (90 Base) MCG/ACT inhaler INHALE 2 PUFFS BY MOUTH EVERY 4 HOURS AS NEEDED FOR WHEEZING OR SHORTNESS OF BREATH 04/18/18   Mikell, Jeani Sow, MD  silver sulfADIAZINE (SILVADENE) 1 % cream APPLY AS  DIRECTED TO WOUND EVERY DAY. 05/15/18   Tonette Bihari, MD  tamsulosin (FLOMAX) 0.4 MG CAPS capsule TAKE 2 CAPSULES BY MOUTH EVERY MORNING 04/18/18   Mikell, Jeani Sow, MD  traMADol (ULTRAM) 50 MG tablet TAKE 1 TABLET BY MOUTH TWICE DAILY AS NEEDED FOR PAIN 04/16/18   Newt Minion, MD  triamcinolone ointment (KENALOG) 0.1 % APPLY TO AFFECTED AREA TWICE DAILY AS NEEDED 03/19/18   Newt Minion, MD  triamterene-hydrochlorothiazide (QIHKVQQ-59) 37.5-25 MG tablet TAKE 1 TABLET BY MOUTH EVERY DAY FOR BLOOD PRESSURE. 04/18/18   Mikell, Jeani Sow, MD    Family History Family History  Problem Relation Age of Onset  . Hypertension Mother     Social History Social History   Tobacco Use  . Smoking status: Never Smoker  . Smokeless tobacco: Never Used  Substance Use Topics  . Alcohol use: No  . Drug use: No     Allergies   Ace inhibitors; Angiotensin receptor blockers; and Metformin   Review of Systems Review of Systems  Constitutional: Negative for chills and fever.  HENT: Negative for congestion and facial swelling.   Eyes: Negative for discharge and visual disturbance.  Respiratory: Positive for shortness of breath.   Cardiovascular: Positive for leg swelling. Negative for chest pain and palpitations.  Gastrointestinal: Negative for abdominal pain, diarrhea and vomiting.  Musculoskeletal: Negative for arthralgias and myalgias.  Skin: Negative for  color change and rash.  Neurological: Negative for tremors, syncope and headaches.  Psychiatric/Behavioral: Negative for confusion and dysphoric mood.     Physical Exam Updated Vital Signs BP (!) 151/73   Pulse 84   Temp 97.9 F (36.6 C) (Oral)   Resp 19   SpO2 100%   Physical Exam  Constitutional: He is oriented to person, place, and time. He appears well-developed and well-nourished.  HENT:  Head: Normocephalic and atraumatic.  JVD up to the angle of the jaw  Eyes: Pupils are equal, round, and reactive to light. EOM are normal.  Neck: Normal range of motion. Neck supple. No JVD present.  Cardiovascular: Normal rate and regular rhythm. Exam reveals no gallop and no friction rub.  No murmur heard. Pulmonary/Chest: No respiratory distress. He has no wheezes. He has rales ( up to mid back).  Abdominal: He exhibits no distension and no mass. There is no tenderness. There is no rebound and no guarding.  Musculoskeletal: Normal range of motion. He exhibits edema.  Chronic leg swelling  Neurological: He is alert and oriented to person, place, and time.  Skin: No rash noted. No pallor.  Psychiatric: He has a normal mood and affect. His behavior is normal.  Nursing note and vitals reviewed.    ED Treatments / Results  Labs (all labs ordered are listed, but only abnormal results are displayed) Labs Reviewed  CBC WITH DIFFERENTIAL/PLATELET - Abnormal; Notable for the following components:      Result Value   RBC 3.62 (*)    Hemoglobin 9.2 (*)    HCT 30.4 (*)    MCH 25.4 (*)    All other components within normal limits  BASIC METABOLIC PANEL - Abnormal; Notable for the following components:   Glucose, Bld 176 (*)    Calcium 8.6 (*)    All other components within normal limits  I-STAT CHEM 8, ED - Abnormal; Notable for the following components:   Glucose, Bld 179 (*)    Hemoglobin 9.9 (*)    HCT 29.0 (*)    All other components  within normal limits  BRAIN NATRIURETIC  PEPTIDE  I-STAT TROPONIN, ED    EKG None  Radiology Dg Chest Port 1 View  Result Date: 05/19/2018 CLINICAL DATA:  Shortness of breath with cough EXAM: PORTABLE CHEST 1 VIEW COMPARISON:  03/18/2014 FINDINGS: Cardiomegaly with mild central congestion. Minimal atelectasis at the bases. No consolidation or effusion. No pneumothorax. IMPRESSION: 1. Cardiomegaly with minimal central congestion. 2. Mild bibasilar atelectasis without focal airspace consolidation. Electronically Signed   By: Donavan Foil M.D.   On: 05/19/2018 01:29    Procedures Procedures (including critical care time)  Medications Ordered in ED Medications  furosemide (LASIX) injection 40 mg (has no administration in time range)     Initial Impression / Assessment and Plan / ED Course  I have reviewed the triage vital signs and the nursing notes.  Pertinent labs & imaging results that were available during my care of the patient were reviewed by me and considered in my medical decision making (see chart for details).     77 yo M with a chief complaint of shortness of breath.  Going on for the past 3 days.  Associated with some cough and congestion.  He does have significant swelling on exam though has a chronic issue with lymphedema.  Will obtain labs give Lasix chest x-ray.  Likely CHF exacerbation baseed on hx and PE.  CXR concerning for fluid overload as read by me.  Mild anemia, lytes with mild hyperglycemia otherwise reassuring.    Discussed with fam med, who will eval for admission.    CRITICAL CARE Performed by: Cecilio Asper   Total critical care time: 35 minutes  Critical care time was exclusive of separately billable procedures and treating other patients.  Critical care was necessary to treat or prevent imminent or life-threatening deterioration.  Critical care was time spent personally by me on the following activities: development of treatment plan with patient and/or surrogate as well as  nursing, discussions with consultants, evaluation of patient's response to treatment, examination of patient, obtaining history from patient or surrogate, ordering and performing treatments and interventions, ordering and review of laboratory studies, ordering and review of radiographic studies, pulse oximetry and re-evaluation of patient's condition.  The patients results and plan were reviewed and discussed.   Any x-rays performed were independently reviewed by myself.   Differential diagnosis were considered with the presenting HPI.  Medications  furosemide (LASIX) injection 40 mg (has no administration in time range)    Vitals:   05/19/18 0100 05/19/18 0103 05/19/18 0115 05/19/18 0145  BP: (!) 156/71  (!) 153/72 (!) 151/73  Pulse: 87  86 84  Resp: 19     Temp:  97.9 F (36.6 C)    TempSrc:  Oral    SpO2: 100%  100% 100%    Final diagnoses:  Hypoxia  Shortness of breath  Hypervolemia, unspecified hypervolemia type    Admission/ observation were discussed with the admitting physician, patient and/or family and they are comfortable with the plan.   Final Clinical Impressions(s) / ED Diagnoses   Final diagnoses:  Hypoxia  Shortness of breath  Hypervolemia, unspecified hypervolemia type    ED Discharge Orders    None       Deno Etienne, DO 05/19/18 0303    Deno Etienne, DO 06/03/18 1539

## 2018-05-19 NOTE — ED Notes (Signed)
Pt being transported to CT via bed w/O2 infusing.

## 2018-05-19 NOTE — ED Notes (Signed)
Pt eating lunch. Monitor intact to pt.

## 2018-05-19 NOTE — Progress Notes (Addendum)
Family Medicine Teaching Service Daily Progress Note Intern Pager: [phone]  Patient name: [name] Medical record number: [id number] Date of birth: 09-13-1941 Age: 77 y.o. Gender: male  Primary Care Provider: Tonette Bihari, MD Consultants: none Code Status: FULL  Pt Overview and Major Events to Date:  5/20: patient admitted to teaching service  Assessment and Plan: [name] is Huff 77yo M presenting with 3 day history of progressively worsening shortness of breath.  PMH significant for T2DM, BLE lymphedema, elephantiasis nostra verrucosa of RLE.   Dyspnea: Could be newly diagnosed CHF, however, differential includes OHS and OSA, PE.  CXR, new murmur concerning for CHF. EKG without evidence of acute ischemia, however p'mitral suggestive of LA overload. Will plan to diurese and workup for CHF vs PE. Given low clinical suspicion for PE, will withhold starting heparin drip at this time.  - Lasix IV 40 mg IV BID - Transthoracic cardiac echocardiogram - CTA  - Telemetry during diuresis, monitor daily electrolytes on diuresis - Daily Weights - Strict I/O's - aspirin 81mg  qhs  Elephantiasis nostra verrucosa  Bilateral LE Lymphedema: Has not seen wound clinic for the past three months. Was told it was mostly resolved.  Not on ABX.  - Diuresing as above - per wound care - Ammonium lactate 12% lotion  Anemia of Chronic Disease: Unknown etiology. Will evaluate further with iron studies - F/u iron panel  T2DM: Last Ha1c per chart review in March 2018, was 14.  Ha1c is 9.3 on admission.  CBG's elevated. Patient currently takes 48U levemir BID for long acting. - resistant SSI - continue long acting regimen of levemir 48U bid  Pressure ulcer of upper posterior thigh, right, stage III - Wound care per Wound Care service  Right Cheek Mass: States it has been present for 2-3 years.  Had it evaluated in 2015 which attending felt it was cyst vs blocked duct vs fatty mass. Nontender  to palpation - Will obtain ultrasound   [place] Intertrigo of abdominal pannus and intergluteal cleft  - Nystatin powder bid  Prostatic Adenocarcinoma: Per patient, urologist is [facility].  Will call to get further history.  Given nonspecific abdominal pain and lack of follow-up with urology, will order further imaging.  - Check PSA - Consider urology referral  - CT pelvis and abdomen to evaluate for metastasis - continue flomax  Infestation by bed bug - Infestation policy of Hospital  Lives alone with limited help [facility] consultation - PT/OT eval and treat  Onychogryphosis of toes, Right > Left  Hyperlipidemia Well controlled. Cholesterol and ldl in normal range on last test in 2015. Will get lipid panel. - F/u lipid panel - pravastatin 40mg  tablet daily  GERD - 40mg  protonix daily   FEN/GI: heart healthy, carb-modified diet PPx: Lovenox @ 0.5mg /kg  Disposition: pending   Subjective:  Not having any pain, is having Huff hard time breathing, better than on admission  Objective: Temp:  [97.9 F (36.6 C)] 97.9 F (36.6 C) (05/21 0103) Pulse Rate:  [84-87] 84 (05/21 0145) Resp:  [19] 19 (05/21 0100) BP: (151-156)/(71-73) 151/73 (05/21 0145) SpO2:  [100 %] 100 % (05/21 0145) Weight:  [340 lb (154.2 kg)] 340 lb (154.2 kg) (05/21 0700) Physical Exam: General: morbidly obese male, pleasant, cooperative on exam, on 2L [place] HEENT: firm, mobile, subcutaneous mass that is nontender to palpation anterior to parotid on R cheek Cardiovascular: RRR, nl S1,S2, III/VI holosystolic blowing murmur heard best over the RUSB, JVD elevated to 5cm  above clavicle, radial pulses equal and intact Respiratory: diminished breath sounds over bilateral mid and lower lobes. No crackles or ronchi. Increased work of breathing, some accessory muscle use. Desaturated to 60's when asked to move during exam.  Abdomen: slightly tender to palpation, BS +. Erythema under abdominal pannus.  Extremities:  no pitting edema. Bilateral dermal changes over calves with scarring and consistent with stasis dermatitis.  RLE with skin changes and keratotic changes consistent with elephantiasis.   Laboratory: Recent Labs  Lab 05/19/18 0151 05/19/18 0201  WBC 10.0  --   HGB 9.2* 9.9*  HCT 30.4* 29.0*  PLT 290  --    Recent Labs  Lab 05/19/18 0151 05/19/18 0201  NA 140 141  K 3.8 3.7  CL 105 101  CO2 28  --   BUN 17 20  CREATININE 0.99 0.90  CALCIUM 8.6*  --   GLUCOSE 176* 179*     [name], Medical Student 05/19/2018, 9:25 AM MS4, [place] Intern pager: [phone], text pages welcome  I have personally seen and examined this patient with [facility] and agree with the above note. The following is my additional documentation.   Physical Exam: General: lying in bed, NAD with non-toxic appearance HEENT: normocephalic, atraumatic, moist mucous membranes Neck: supple, JVD present Cardiovascular: regular rate and rhythm with systolic murmur Lungs: bibasilar crackles though limit due to body habitus with normal work of breathing on 2 L [place] Abdomen: morbid obesity, soft, non-tender, non-distended, normoactive bowel sounds Skin: warm, dry, no rashes or lesions, cap refill < 2 seconds Extremities: warm and well perfused, normal tone, 3+ edema BLE with chronic skin changes  Assessment/Plan [name] is Huff 77 y.o. male presenting with new onset progressive shortness of breath concerning for new onset heart failure.  PMH significant for diabetes, chronic lymphedema with elephantitis of right lower extremity, morbid obesity, anemia of chronic disease, prostatic adenocarcinoma with poor follow-up, hyperlipidemia, GERD.  Patient states he continues to have some shortness of breath today.  Denies any chest pain.  1.  Shortness of breath: Acute.  No history of ACS the patient with new hypoxia.  Tolerating nasal cannula.  CXR without evidence of focal infiltrate.  BNP  43 on arrival the patient's morbid obesity may cause false negative.  Patient is on chronic Lasix for lymphedema and is having adequate urine output since arrival.  Suspect there may be Huff component of OSA and OHS given morbid obesity.  Wells score 1, unlikely PE.  Plan to continue diuresis and monitor on telemetry with daily weights and strict I&O's.  2.  Elephantitis and bilateral lower lymphedema: Chronic.  No signs of secondary bacterial infection.  Continue ammonium lactate 12% lotion.  Wound care consulted.  Diuresing per above.  3.  Anemia of chronic disease: Checking iron studies.  Hemoglobin remained stable.  No signs of active bleeding.  4.  Diabetes type 2: No recent A1c.  Will check.  Home medications include Levemir 48 units twice daily.  Continue home regimen with resistance sliding scale.  5.  Prostatic adenocarcinoma: Has recent lack of follow-up with urology.  Will check PSA.  Obtaining CT pelvis and abdomen to evaluate for metastasis.  On Flomax.  Having appropriate urine output.  Dispo: Pending improvement of shortness of breath and work-up for possible new onset heart failure while diuresing.    Harriet Butte, [place], PGY-2

## 2018-05-19 NOTE — ED Notes (Signed)
Pt CBG was 171, notified Rebecca(RN)

## 2018-05-19 NOTE — Progress Notes (Signed)
  Interim Progress Note  Went up to assess patient due to nursing concerns for respiratory distress. Patient resting in bed able to speak in full sentences and mentating normally however he does have increased work of breathing. He denies chest pain or chest pressure. Complains of pain in left leg due to wound on his leg. He reports overall his breathing is improved compared to before he came in. Pulmonary exam limited by body habitus but lungs without adventitious sounds. Discussed with RN, we are unable to get accurate urine output measurements due to incontinence. Will place Foley. Will obtain stat EKG, troponin, portable CXR and ABG. Will transfer patient to stepdown for closer monitoring overnight.   Lucila Maine, DO PGY-2, [facility] 05/19/2018 6:32 PM

## 2018-05-19 NOTE — ED Notes (Signed)
Family practice MD at bedside.

## 2018-05-19 NOTE — H&P (Addendum)
[name] Admission History and Physical Service Pager: [phone]  Patient name: [name] Medical record number: [id number] Date of birth: 1941/01/25 Age: 77 y.o. Gender: male  Primary Care Provider: Tonette Bihari, MD Consultants: none Code Status: full  Chief Complaint: shortness of breath  Assessment and Plan: [name] is a 78 y.o. male presenting with 3 day history of progressively worsening shortness of breath .   Shortness of breath  likely CHF exacerbation 3 day history of progressive shortness of breath. Consistent with CHF exacerbation given orthopnea and position dependent dyspnea in setting of crackles on exam. BNP w/n/l although this could be falsely low due to patient's body habitus. No echo on file. Takes lasix 40mg  bid at home for his lymphedema (discussed below). Other items to consider are new onset COPD exacerbation, viral vs bacterial lung infection, PE. Unlikely to be COPD without smoking history and lack of wheezing on exam or significant sputum production. Does have flovent and albuterol on medication list so suspect there is some underlying component of undiagnosed asthma/copd. Possible infectious etiology but cxr, vital signs, and lack of leukocytosis make this less likely. Patient at increased risk for PE given his lack of mobility and possibly untreated prostate cancer therefore will obtain CTA. Will proceed with usual workup for chf exacerbation. Will give lasix 40mg  BID IV, daily weight, I/Os, and will need echo. Patient did not get ekg (V1 flattened t-wave) in ed, so will get this for completeness, will work up ACS as well.  - admit to family medicine teaching service, appropriate for floor, admit to Dr. McDiarmid - vital signs per floor routine - Lasix 40mg  IV bid  - Follow up EKG - Transthoracic echocardiogram - Lovenox, 0.5mg /kg dosing - strict I/Os - Daily weight - heart-healthy, carb-modified diet - PT/OT  eval and treat - troponins x 3 and AM EKG - CTA pending  - continue flovent, albuterol nebs as needed  BLE lymphedema Patient with 3+ edema in BLE. In RLE there is a barky-grey appearance and in the RLE there is mild epithelial disruption with clear weeping. From chart review it appears that the patient has been diagnosed with osteomyelitis in the LLE and has been previously seen by Dr. Sharol Given. Could consider additional imaging as it was last imaged in 2017. Currently not on any systemic abx and has been performing wound care for treatment. States he has been seeing lymphedema clinic up until 3 months ago. Will have wound care come and evaluate. - wound care eval and treat  - lasix for BLE swelling as above - PT/OT eval and treat  Right Cheek Mass: States it has been present for 2-3 years. Has not ever had it evaluated  - Will obtain ultrasound of mass   Type II diabetes Patient currently takes 48U levemir BID for long acting. Has had compliance issues in the past. Glucose in 170s on admission. hgb A1C 14.3 at last check in 02/2017. Will continue levemir 48U bid, resistant SSI. Will get A1C. - continue long acting regimen of levemir 48U bid - resistant SSI - F/U Hgb A1C  Hypertension SBP in 150s in ED. Takes combo triameterene/hctz combination. Will continue this medication while admitted. - continue triameterene/hctz 37.5/25 - holding home dose of lasix - giving 40mg  IV bid lasix for diuresis  Anemia Patient appears to have chronic anemia of unknown origin. Last mentioned in 2013. MCV of 84 while not technically microcytic is close. Will draw Iron panel to evaluate  for IDA. Could also be attributable to anemia of chronic disease 2/2 above BLE problems. hgb 9.9 at admission. - f/u iron panel - daily cbc  Elevated PSA/Hx of Prostate Adenocarcinoma  Patient with concerning history of elevated PSA, up to 25.8 in 02/2016. Was supposed to see urology but declined to follow up due to high  co-pay he would have to pay at his visit. Given that this issue has not been addressed, will repeat PSA. Has symptoms of prostate enlargement for which he takes flomax. - continue flomax - follow up PSA  Appearance of Bed bug infestation Small insects noted on patient at admission with appearance of bed bugs. Will be on contact precautions during admission.  Hyperlipidemia Well controlled. Cholesterol and ldl in normal range on last test in 2015. Will get lipid panel. - follow up lipid panel - continue pravastatin 40mg  tablet daily  GERD Well controlled on 40mg  protonix daily  Morbid obesity/Impaired mobility Given patient's size as well as his problems with BLE (discussed above) will ask PT/OT to evaluate. May need Snf placement when medically ready for discharge. Patient will need dietary counseling on discharge with pcp.  PMH is significant for prostate adenocarcinoma, anemia, BLE lymphadema, chronic non-healing wounds BLE, T2DM, elephantiasis nostras verrucosa RLE, impaired mobility, morbid obesity, hypertension, HLD  FEN/GI: heart healthy, carb-modified diet Prophylaxis: Lovenox @ 0.5mg /kg  Disposition: pending clinical course  History of Present Illness:  [name] is a 77 y.o. male presenting with 3 day history of progressive shortness of breath. He describes the shortness of breath as "having a hard time catching my breath" and "needing to breath faster to catch my breath". He states that it has been worsening since he first noticed it. He states that it is significantly worse when he is laying flat, and that he has been needing to be propped up with a pillow. He has noticed that his legs and abdomen have become more swollen during this same time course. Eventually this shortness of breath became too much to handle and he decided to come in for treatment. He has not noticed any wheezing or significant sputum production, no   Workup in the ed consisted of a bmp, cbc, bnp,  troponin, glucose, and chest xray. Bmp only significant for elevated glucose up to 176. Cbc significant for hgb 9.2/hct 30.4. CXR significant for cardiomegaly w/ central congestion, mild bibasilar atelectasis. BNP 43.8, troponin 0.02.  Received one dose of lasix 40mg  IV in ed. Was placed on 2L [place] and has been satting at 100% since presentation to ED.  Review Of Systems: Per HPI with the following additions:   Review of Systems  Constitutional: Negative for chills and fever.  HENT: Negative for congestion and sore throat.   Eyes: Negative for pain.  Respiratory: Positive for cough and shortness of breath. Negative for hemoptysis, sputum production and wheezing.   Cardiovascular: Negative for chest pain and palpitations.  Gastrointestinal: Negative for abdominal pain, constipation, diarrhea, nausea and vomiting.  Skin:       chornic non-healing ulcers BLE  Neurological: Negative for dizziness and headaches.  Psychiatric/Behavioral: Negative for depression.    Patient Active Problem List   Diagnosis Date Noted  . Fluid overload 05/19/2018  . SBO (small bowel obstruction) ([place]) 03/15/2017  . Elephantiasis nostras 03/29/2016  . Impaired mobility 09/09/2014  . Diabetic peripheral neuropathy associated with type 2 diabetes mellitus ([place]) 02/07/2014  . Ulcer of left lower leg ([place]) 10/05/2012  . Anemia 05/21/2012  . Chronic  leg pain 11/17/2011  . Chronic osteomyelitis of lower leg ([place]) 11/20/2010  . HLD (hyperlipidemia) 12/01/2008  . ADENOCARCINOMA, PROSTATE 05/24/2008  . DM type 2, uncontrolled, with neuropathy ([place]) 01/20/2008  . Morbid obesity with body mass index of 50.0-59.9 in adult ([place]) 03/05/2007  . Essential hypertension 03/05/2007    Past Medical History: Past Medical History:  Diagnosis Date  . COLON POLYP 02/26/2007   Qualifier: Diagnosis of  By: Erling Cruz  MD, MELISSA    . Complication of anesthesia   . Diabetes mellitus   . GERD (gastroesophageal reflux disease)   .  Heart murmur    last 2D Echo -03/31/08  . HIATAL HERNIA WITH REFLUX 09/29/2006   Qualifier: Diagnosis of  By: Erling Cruz  MD, MELISSA    . HIP REPLACEMENT, BILATERAL, HX OF 05/12/2007   Qualifier: Diagnosis of  By: Erling Cruz  MD, MELISSA    . History of blood transfusion   . Hypertension   . OSTEOARTHRITIS 03/05/2007   Qualifier: Diagnosis of  By: Erling Cruz  MD, MELISSA    . PONV (postoperative nausea and vomiting)   . Prostate cancer ([place])   . SBO (small bowel obstruction) ([place]) 02/2017  . Seasonal allergies   . VENOUS INSUFFICIENCY, CHRONIC 02/26/2007   Qualifier: Diagnosis of  By: Erling Cruz  MD, MELISSA      Past Surgical History: Past Surgical History:  Procedure Laterality Date  . COLONOSCOPY    . COLONOSCOPY    . HIP ARTHROPLASTY     bil  . I&D EXTREMITY  08/14/2012   Procedure: IRRIGATION AND DEBRIDEMENT EXTREMITY;  Surgeon: Newt Minion, MD;  Location: [facility];  Service: Orthopedics;  Laterality: Right;  Irrigation and Debridement Right tibia, Placement antibiotic beads  . I&D EXTREMITY Left 03/18/2014   Procedure: IRRIGATION AND DEBRIDEMENT EXTREMITY;  Surgeon: Newt Minion, MD;  Location: [facility];  Service: Orthopedics;  Laterality: Left;  Debridement Left Calf Ulcer, Apply Theraskin and Wound VAC  . IRRIGATION AND DEBRIDEMENT ABSCESS Left 03/18/2014   DR DUDA  . LEG SURGERY     ROD for fracture  . LEG WOUND REPAIR / CLOSURE     Beads .  Skin wound  . SKIN GRAFT     right leg- from donor skin  . SKIN GRAFT  1976   R wrist   skin graft from left thigh  . UPPER GASTROINTESTINAL ENDOSCOPY    . WISDOM TOOTH EXTRACTION    . WRIST FUSION     right wrist    Social History: Social History   Tobacco Use  . Smoking status: Never Smoker  . Smokeless tobacco: Never Used  Substance Use Topics  . Alcohol use: No  . Drug use: No   Additional social history: Please also refer to relevant sections of EMR.  Family History: Family History  Problem Relation Age of Onset  . Hypertension Mother      Allergies and Medications: Allergies  Allergen Reactions  . Ace Inhibitors Cough    With Lisinopril.  . Angiotensin Receptor Blockers Other (See Comments)    Caused excessive weight gain  . Metformin Diarrhea   No current facility-administered medications on file prior to encounter.    Current Outpatient Medications on File Prior to Encounter  Medication Sig Dispense Refill  . aspirin 81 MG chewable tablet Chew 81 mg by mouth every other day.     . esomeprazole (NEXIUM) 40 MG capsule TAKE 1 CAPSULE BY MOUTH EVERY DAY 60 capsule 0  . FLOVENT HFA  220 MCG/ACT inhaler INHALE 1 PUFF INTO THE LUNGS TWICE DAILY 36 g 1  . fluticasone (CUTIVATE) 0.05 % cream Apply 1 application topically 2 (two) times daily. Reported on 05/23/2016    . furosemide (LASIX) 40 MG tablet TAKE 1 TABLET BY MOUTH TWICE DAILY. 60 tablet 2  . Insulin Detemir (LEVEMIR FLEXTOUCH) 100 UNIT/ML Pen Inject 48 Units into the skin 2 (two) times daily. 45 mL 10  . latanoprost (XALATAN) 0.005 % ophthalmic solution INSTILL 1 DROP IN BOTH EYES EVERY EVENING FOR GLAUCOMA 2.5 mL 1  . latanoprost (XALATAN) 0.005 % ophthalmic solution INSTILL 1 DROP IN BOTH EYES EVERY EVENING FOR GLAUCOMA 2.5 mL 2  . LEVEMIR FLEXTOUCH 100 UNIT/ML Pen INJECT 50 UNITS SUBCUTANEOUSLY TWICE DAILY 45 pen 5  . metoprolol tartrate (LOPRESSOR) 100 MG tablet TAKE 1/2 TABLET BY MOUTH 2 TIMES DAILY. 60 tablet 2  . mometasone (NASONEX) 50 MCG/ACT nasal spray INSTILL 2 SPRAYS INTO THE NOSE DAILY 17 g 2  . mupirocin ointment (BACTROBAN) 2 % Apply 1 application topically 2 (two) times daily. To legs    . NOVOLOG FLEXPEN 100 UNIT/ML FlexPen INJECT 45 UNITS SUBCUTANEOUSLY TWICE DAILY 51 pen 0  . pravastatin (PRAVACHOL) 40 MG tablet TAKE 1 TABLET BY MOUTH DAILY 90 tablet 3  . PROAIR HFA 108 (90 Base) MCG/ACT inhaler INHALE 2 PUFFS BY MOUTH EVERY 4 HOURS AS NEEDED FOR WHEEZING OR SHORTNESS OF BREATH 8.5 each 2  . silver sulfADIAZINE (SILVADENE) 1 % cream APPLY AS  DIRECTED TO WOUND EVERY DAY. 50 g 0  . tamsulosin (FLOMAX) 0.4 MG CAPS capsule TAKE 2 CAPSULES BY MOUTH EVERY MORNING 60 capsule 0  . traMADol (ULTRAM) 50 MG tablet TAKE 1 TABLET BY MOUTH TWICE DAILY AS NEEDED FOR PAIN 40 tablet 0  . triamcinolone ointment (KENALOG) 0.1 % APPLY TO AFFECTED AREA TWICE DAILY AS NEEDED 80 g 0  . triamterene-hydrochlorothiazide (MAXZIDE-25) 37.5-25 MG tablet TAKE 1 TABLET BY MOUTH EVERY DAY FOR BLOOD PRESSURE. 30 tablet 2  . EASY TOUCH PEN NEEDLES 31G X 8 MM MISC USE WITH FLEXPEN AS DIRECTED 100 each 7  . Lancet Devices (ONE TOUCH DELICA LANCING DEV) MISC 75 Units by Does not apply route 2 (two) times daily. 1 each 0  . ONE TOUCH ULTRA TEST test strip USE TO CHECK BLOOD SUGAR FOUR TIMES A DAY 100 each 11  . ONE TOUCH ULTRA TEST test strip USE TO TEST BLOOD SUGAR TWICE DAILY 100 each 10  . ONETOUCH DELICA LANCETS 16W MISC USE TO CHECK BLOOD SUGAR TWICE DAILY. 200 each 9    Objective: BP (!) 151/73   Pulse 84   Temp 97.9 F (36.6 C) (Oral)   Resp 19   SpO2 100%  Exam: General: No acute distress, resting comfortably propped up in bed. Obese AA male on 2L [place]. Eyes: eomi, perrl. ENTM: no external trauma to head or neck Neck: range of motion intact Cardiovascular: rrr, no m/r/g appreciated. Palpable radial pulse bilaterally. Unable to palpate PT/DP bilaterally due to edema. 3+ edema BLE. Respiratory: bibasilar crackles, comfortable WOB, on 2L [place] Gastrointestinal: soft, non-tender, non-distended. Abdominal edema noted. MSK: limited movement BLE, Unable to raise off bed fully. 5/5 strength BUE. Derm: barky, grey discoloration of RLE. Notable edema in BLE. LLE with mild weeping of clear fluid, limited skin breakdown, multiple bed bugs bites noted on upper body  Neuro: aox4, cn 2-12 intact, no focal neuro deficits aside from noted BLE weakness Psych: appropriate  Labs and Imaging: CBC  BMET  Recent Labs  Lab 05/19/18 0151 05/19/18 0201  WBC 10.0  --   HGB  9.2* 9.9*  HCT 30.4* 29.0*  PLT 290  --    Recent Labs  Lab 05/19/18 0151 05/19/18 0201  NA 140 141  K 3.8 3.7  CL 105 101  CO2 28  --   BUN 17 20  CREATININE 0.99 0.90  GLUCOSE 176* 179*  CALCIUM 8.6*  --       Guadalupe Dawn, MD 05/19/2018, 3:29 AM PGY-1, [place] [facility] pager: [phone], text pages welcome  UPPER LEVEL ADDENDUM  I have read the above note and made revisions highlighted in blue.  Kerrin Mo, MD, PGY-3 [facility]

## 2018-05-19 NOTE — ED Notes (Signed)
Secretary paging Admitting Resident so RN may inquire if pt remains appropriate for M-S floor.

## 2018-05-19 NOTE — ED Notes (Signed)
Dr McDiarmid in w/pt. Pt noted to be wearing hospital gown - O2 infusing at 2L/min via n/c. No personal belongings noted in room. Pt alert, oriented, cooperative. Dr removing dressings on bil legs - legs noted to be swollen - weeping yellow drainage - small amount. Pt noted w/multiple open areas to buttocks/post upper thighs. Advised consulting w/Wound Care RN.

## 2018-05-19 NOTE — ED Notes (Signed)
Lab advised will add on PSA.

## 2018-05-19 NOTE — ED Notes (Signed)
Internal Medicine in w/pt.

## 2018-05-19 NOTE — ED Notes (Signed)
Admitting Md at bedside-Monique,RN  

## 2018-05-19 NOTE — Progress Notes (Signed)
Pt arrives to unit with mild respiratory distress.  Labored breathing with use of accessory muscles.  Respiratory rate 30.  Satting 100% on 3L.  Jugular distention noted up to jaw line.  Pt states, "this is not my normal"  Notified attending with family medicine, Dr. Mingo Amber.  Dr. Mingo Amber states, "sending resident to see pt"  Also, notified rapid response nurse to keep an eye out on patient.

## 2018-05-19 NOTE — Progress Notes (Signed)
Pt tranferring to 6e01 at this time.

## 2018-05-19 NOTE — ED Notes (Signed)
Dr Enid Derry advised to keep bed status as Med-Surg - feels elevated Trop not cardiac related.

## 2018-05-19 NOTE — ED Notes (Signed)
Secretary has paged Wound Care RN earlier this am. Waiting for return call.

## 2018-05-19 NOTE — Progress Notes (Signed)
RT called to patient room due to patient having noted respiratory distress.  Upon arrival patient was noted to be using accessory muscles however had sats of 100% on 3L nasal cannula.  Attempted to get ABG however obtained venous sample, will pass off to following RT to retry.  Patient transferred to 6E01 and upon arrival to room placed on bipap.  Prior to transport patient slightly became more lethargic however would still respond when stimulated.  Patient currently tolerating bipap at this time.  Will continue to monitor.

## 2018-05-19 NOTE — ED Notes (Signed)
Secretary paging Admitting MD x 2.

## 2018-05-19 NOTE — ED Notes (Signed)
Returned from U/S

## 2018-05-19 NOTE — ED Notes (Addendum)
EVS brought pt's glasses - placed on bedside table. Pt aware. Pt states he did not bring any belongings w/him to ED.

## 2018-05-19 NOTE — ED Notes (Signed)
Left message for Wound Care RN.

## 2018-05-19 NOTE — ED Triage Notes (Signed)
Pt. Arrives via EMS with increased shob x3 days and weeping of legs x3 days. Pt. Reports pain in legs. Pt. Has hx. Of diabetes. 87% on RA. A/0 X4.

## 2018-05-19 NOTE — ED Notes (Signed)
Returned from CT.

## 2018-05-20 ENCOUNTER — Other Ambulatory Visit (HOSPITAL_COMMUNITY): Payer: PPO

## 2018-05-20 DIAGNOSIS — R0603 Acute respiratory distress: Secondary | ICD-10-CM

## 2018-05-20 LAB — BASIC METABOLIC PANEL
Anion gap: 11 (ref 5–15)
BUN: 15 mg/dL (ref 6–20)
CALCIUM: 8.4 mg/dL — AB (ref 8.9–10.3)
CO2: 31 mmol/L (ref 22–32)
Chloride: 101 mmol/L (ref 101–111)
Creatinine, Ser: 0.91 mg/dL (ref 0.61–1.24)
GFR calc Af Amer: >60 mL/min (ref >60)
Glucose, Bld: 87 mg/dL (ref 65–99)
Potassium: 3.8 mmol/L (ref 3.5–5.1)
SODIUM: 143 mmol/L (ref 135–145)

## 2018-05-20 LAB — GLUCOSE, CAPILLARY
GLUCOSE-CAPILLARY: 78 mg/dL (ref 65–99)
Glucose-Capillary: 116 mg/dL — ABNORMAL HIGH (ref 65–99)
Glucose-Capillary: 158 mg/dL — ABNORMAL HIGH (ref 65–99)
Glucose-Capillary: 68 mg/dL (ref 65–99)
Glucose-Capillary: 70 mg/dL (ref 65–99)
Glucose-Capillary: 87 mg/dL (ref 65–99)

## 2018-05-20 LAB — CBC
HCT: 30.7 % — ABNORMAL LOW (ref 39.0–52.0)
Hemoglobin: 9.1 g/dL — ABNORMAL LOW (ref 13.0–17.0)
MCH: 25.2 pg — ABNORMAL LOW (ref 26.0–34.0)
MCHC: 29.6 g/dL — ABNORMAL LOW (ref 30.0–36.0)
MCV: 85 fL (ref 78.0–100.0)
PLATELETS: 265 10*3/uL (ref 150–400)
RBC: 3.61 MIL/uL — ABNORMAL LOW (ref 4.22–5.81)
RDW: 14.1 % (ref 11.5–15.5)
WBC: 9.4 10*3/uL (ref 4.0–10.5)

## 2018-05-20 LAB — MRSA PCR SCREENING: MRSA by PCR: NEGATIVE

## 2018-05-20 LAB — TSH: TSH: 0.737 u[IU]/mL (ref 0.350–4.500)

## 2018-05-20 LAB — PSA, SERUM (SERIAL MONITOR): PROSTATE SPECIFIC AG, SERUM: 11.7 ng/mL — AB (ref 0.0–4.0)

## 2018-05-20 MED ORDER — FUROSEMIDE 10 MG/ML IJ SOLN
80.0000 mg | Freq: Two times a day (BID) | INTRAMUSCULAR | Status: DC
  Administered 2018-05-20 – 2018-05-21 (×2): 80 mg via INTRAVENOUS
  Filled 2018-05-20 (×2): qty 8

## 2018-05-20 NOTE — Progress Notes (Addendum)
Family Medicine Teaching Service Daily Progress Note Intern Pager: [phone]  Patient name: [name] Medical record number: [id number] Date of birth: 03-Oct-1941 Age: 77 y.o. Gender: male  Primary Care Provider: Tonette Bihari, MD Consultants: none Code Status: FULL  Pt Overview and Major Events to Date:  5/20: patient admitted to teaching service  Assessment and Plan: [name] is a 77yo M presenting with 3 day history of progressively worsening shortness of breath.  PMH significant for T2DM, BLE lymphedema, elephantiasis nostra verrucosa of RLE.   Dyspnea: CTA without evidence of PE.  Will plan to diurese and workup for CHF.  Awaiting echo results for CHF diagnosis.  If abnormal, will consider cardiology consult for further evaluation and potential heart catheterization. If normal, will pursue other differential etiologies including OSA, pulmonary etiology.  Weight down 4lbs since admission, appears to be diuresing appropriately. Will continue current regimen and await echo results.  - IV Lasix 40 mg BID - Transthoracic cardiac echocardiogram - Telemetry during diuresis, monitor daily electrolytes on diuresis - Daily Weights - Strict I/O's - aspirin 81mg  qhs  Elephantiasis nostra verrucosa  Bilateral LE Lymphedema: Has not seen wound clinic for the past three months.  Not on ABX.  - Diuresing as above - per wound care - Ammonium lactate 12% lotion  Normocytic Anemia of Chronic Disease: Unknown etiology. Ferritin normal.  Iron studies pattern consistent with AoCD.  Will consider causes of chronic inflammation, monitor Hgb/Hct, however at this time no further treatment and transfusion not indicated.  - daily CBC  T2DM: Last Ha1c per chart review in March 2018, was 14.  Ha1c is 9.3 on admission.  CBG's elevated. Patient currently takes 48U levemir BID for long acting. - resistant SSI - continue long acting regimen of levemir 48U bid  Pressure ulcer of upper posterior  thigh, right, stage III - Wound care per Wound Care service  Right Cheek Mass: States it has been present for 2-3 years.  Had it evaluated in 2015 which attending felt it was cyst vs blocked duct vs fatty mass. Nontender to palpation.  Ultrasound nonspecific and suggested further evaluation with CT/MR.  Will consider further evaluation. - Consider MR/CT of mass  Monilla Intertrigo of abdominal pannus and intergluteal cleft  - Nystatin powder bid  Prostatic Adenocarcinoma: Per patient, urologist is [facility], per their office his last visit was Dec 2017.  CT abdomen/pelvis negative for any obvious pathology concerning for metastatic cancer, however prostate was not entirely visualized.  This appears to not be concerning for acute process, recommend further outpatient work-up. - Outpatient follow-up warranted - continue flomax  Infestation by bed bug - Infestation policy of Hospital  Lives alone with limited help [facility] consultation - PT/OT eval and treat  Onychogryphosis of toes, Right > Left  Hyperlipidemia Well controlled. Cholesterol and ldl in normal range on last test in 2015. Lipid panel on admission notable only for low HDL.  - pravastatin 40mg  tablet daily  GERD - 40mg  protonix daily   FEN/GI: heart healthy, carb-modified diet PPx: Lovenox @ 0.5mg /kg  Disposition: pending   Subjective:  Feels better since admission. Is cold.   Objective: Temp:  [97.7 F (36.5 C)-98.3 F (36.8 C)] 98.3 F (36.8 C) (05/22 0743) Pulse Rate:  [58-95] 64 (05/22 0826) Resp:  [12-30] 14 (05/22 0826) BP: (98-160)/(49-79) 113/59 (05/22 0826) SpO2:  [92 %-100 %] 100 % (05/22 0743) FiO2 (%):  [35 %-50 %] 35 % (05/22 0824) Weight:  [335 lb 11.2 oz (  152.3 kg)-339 lb 11.7 oz (154.1 kg)] 335 lb 11.2 oz (152.3 kg) (05/22 0433) Physical Exam: General: morbidly obese male, pleasant, cooperative on exam, on BIPAP HEENT: firm, mobile, subcutaneous mass that is nontender to palpation  anterior to parotid on R cheek Cardiovascular: RRR, nl S1,S2, III/VI holosystolic blowing murmur heard best over the RUSB, JVD elevated to 10cm above clavicle, radial pulses equal and intact Respiratory: diminished breath sounds over bilateral mid and lower lobes. No crackles or ronchi. Normal WoB. Desaturated to 80's when asked to move during exam.  Abdomen: slightly tender to palpation, BS +. Erythema under abdominal pannus.  Extremities: 2+ Pitting edema when palpated long enough. Bilateral dermal changes over calves with scarring and consistent with stasis dermatitis.  RLE with skin changes and keratotic changes consistent with elephantiasis.   Laboratory: Recent Labs  Lab 05/19/18 0151 05/19/18 0201 05/20/18 0426  WBC 10.0  --  9.4  HGB 9.2* 9.9* 9.1*  HCT 30.4* 29.0* 30.7*  PLT 290  --  265   Recent Labs  Lab 05/19/18 0151 05/19/18 0201 05/19/18 0848 05/20/18 0426  NA 140 141  --  143  K 3.8 3.7  --  3.8  CL 105 101  --  101  CO2 28  --   --  31  BUN 17 20  --  15  CREATININE 0.99 0.90  --  0.91  CALCIUM 8.6*  --   --  8.4*  PROT  --   --  8.5*  --   BILITOT  --   --  0.5  --   ALKPHOS  --   --  120  --   ALT  --   --  12*  --   AST  --   --  17  --   GLUCOSE 176* 179*  --  87     Roche, Heidi A, Medical Student 05/20/2018, 8:44 AM MS4, [place] Intern pager: [phone], text pages welcome   I have personally seen and examined this patient with Heidi Roche and agree with the above note. The following is my additional documentation.   Physical Exam: General: lying in bed comfortable, NAD with non-toxic appearance HEENT: normocephalic, atraumatic, moist mucous membranes Neck: supple, JVD up to mandible Cardiovascular: regular rate and rhythm with systolic murmur Lungs: bibasilar crackles though limit due to body habitus with normal work of breathing on BiPAP Abdomen: morbid obesity, soft, non-tender, non-distended, normoactive bowel  sounds Skin: warm, dry, no rashes or lesions, cap refill < 2 seconds Extremities: warm and well perfused, normal tone, 3+ bilateral LE edema with chronic skin changes  Assessment/Plan [name] is a 77 y.o. male presenting with new onset progressive shortness of breath concerning for new onset heart failure.  PMH significant for diabetes, chronic lymphedema with elephantitis of right lower extremity, morbid obesity, anemia of chronic disease, prostatic adenocarcinoma with poor follow-up, hyperlipidemia, GERD.  Patient doing well on BiPAP. Says he is not SOB and denies CP.  1.  Shortness of breath  Hypoxia: Acute.  No history of ACS the patient with new hypoxia no on BiPAP.  Tolerating nasal cannula.  CXR without evidence of focal infiltrate.  CTA neg for PE.  BNP 43 on arrival the patient's morbid obesity may cause false negative.  Patient is on chronic PO Lasix 80 mg daily for lymphedema and is having adequate urine output since arrival now with foley.  Suspect there may be a component of OSA and OHS  given morbid obesity.  Plan to continue diuresis and monitor on telemetry with daily weights and strict I&O's.  Will continue IV Lasix 80 mg BID. ECHO pending.  2.  Elephantitis and bilateral lower lymphedema: Chronic.  No signs of secondary bacterial infection.  Continue ammonium lactate 12% lotion.  Wound care consulted.  Diuresing per above.  3.  Anemia of chronic disease: Checking iron studies.  Hemoglobin remained stable.  No signs of active bleeding.  4.  Diabetes type 2: No recent A1c.  Will check.  Home medications include Levemir 48 units twice daily.  Continue home regimen with resistance sliding scale.  5.  Prostatic adenocarcinoma: Has recent lack of follow-up with urology.  Will check PSA.  Obtaining CT pelvis and abdomen to evaluate for metastasis.  On Flomax.  Having appropriate urine output.  Dispo: Pending improvement of shortness of breath and work-up for possible new  onset heart failure while diuresing.    Harriet [place], [place], PGY-2

## 2018-05-20 NOTE — Progress Notes (Signed)
Pt taken off Bipap at this time, tolerating well. No distress noted. RT will monitor

## 2018-05-20 NOTE — Evaluation (Signed)
Occupational Therapy Evaluation Patient Details Name: [name] MRN: [id number] DOB: 01-22-1941 Today's Date: 05/20/2018    History of Present Illness Pt admitted with 3 days of progressive SOB and LE weeping, PE ruled out. PMH: DM, prostate cancer, B LE lymphedema, wounds on back side of both legs.   Clinical Impression   Pt can transfer to his motorized w/c with crutches independently and is modified independent using a reacher in ADL and IADL. Limited session today to sitting EOB due to lack of crutches for session appropriate for pt. Pt likely to progress well and be able to return home to his apartment. Pt with stable vital signs on 3L 02. Will follow.    Follow Up Recommendations  Home health OT    Equipment Recommendations  None recommended by OT    Recommendations for Other Services       Precautions / Restrictions Precautions Precautions: Fall Restrictions Weight Bearing Restrictions: No      Mobility Bed Mobility Overal bed mobility: Needs Assistance Bed Mobility: Supine to Sit;Sit to Supine     Supine to sit: Supervision;HOB elevated Sit to supine: Mod assist   General bed mobility comments: increased time, use of rail, assist for LEs back into bed  Transfers                 General transfer comment: deferred this visit, needs bariatric equipment    Balance Overall balance assessment: Needs assistance   Sitting balance-Leahy Scale: Good                                     ADL either performed or assessed with clinical judgement   ADL Overall ADL's : Needs assistance/impaired Eating/Feeding: Independent;Bed level   Grooming: Wash/dry hands;Wash/dry face;Sitting;Set up   Upper Body Bathing: Set up;Cueing for UE precautions   Lower Body Bathing: Bed level;Total assistance   Upper Body Dressing : Minimal assistance;Sitting   Lower Body Dressing: Total assistance;Bed level                 General ADL Comments:  Limited evaluation with lack of extra wide walker or crutches.     Vision Baseline Vision/History: Wears glasses Wears Glasses: At all times Patient Visual Report: No change from baseline       Perception     Praxis      Pertinent Vitals/Pain Pain Assessment: No/denies pain     Hand Dominance Right   Extremity/Trunk Assessment Upper Extremity Assessment Upper Extremity Assessment: Overall WFL for tasks assessed   Lower Extremity Assessment Lower Extremity Assessment: Defer to PT evaluation   Cervical / Trunk Assessment Cervical / Trunk Assessment: Other exceptions Cervical / Trunk Exceptions: obesity   Communication Communication Communication: No difficulties   Cognition Arousal/Alertness: Awake/alert Behavior During Therapy: WFL for tasks assessed/performed Overall Cognitive Status: Within Functional Limits for tasks assessed                                     General Comments       Exercises     Shoulder Instructions      Home Living Family/patient expects to be discharged to:: Private residence Living Arrangements: Alone Available Help at Discharge: Friend(s);Available PRN/intermittently;Family Type of Home: Apartment Home Access: Level entry     Home Layout: One level     Bathroom  Shower/Tub: Other (comment)(pt sponge bathes)   Bathroom Toilet: Handicapped height     Home Equipment: Wheelchair - power;Crutches;Adaptive equipment;Hospital bed Adaptive Equipment: Reacher Additional Comments: reports the leg rests of his w/c are in disrepair      Prior Functioning/Environment Level of Independence: Independent with assistive device(s)        Comments: transfers independently to motorized w/c, sponge bathes and dresses independently using reacher, friends get groceries, uses [employer] to get to grocery store        OT Problem List: Decreased activity tolerance;Impaired balance (sitting and/or standing);Decreased knowledge of use  of DME or AE;Pain;Obesity;Cardiopulmonary status limiting activity      OT Treatment/Interventions: Self-care/ADL training;DME and/or AE instruction;Balance training;Patient/family education;Therapeutic activities    OT Goals(Current goals can be found in the care plan section) Acute Rehab OT Goals Patient Stated Goal: to return home OT Goal Formulation: With patient Time For Goal Achievement: 06/03/18 Potential to Achieve Goals: Good ADL Goals Pt Will Perform Lower Body Bathing: with modified independence;with adaptive equipment;sit to/from stand;sitting/lateral leans Pt Will Perform Lower Body Dressing: with modified independence;sit to/from stand;sitting/lateral leans;with adaptive equipment Pt Will Transfer to Toilet: with modified independence;stand pivot transfer Additional ADL Goal #1: Pt will perform bed mobility modified independently in preparation for ADL. Additional ADL Goal #2: Pt will utilize energy conservation and breathing techniques during ADL independently.  OT Frequency: Min 2X/week   Barriers to D/C:            Co-evaluation              AM-PAC PT "6 Clicks" Daily Activity     Outcome Measure Help from another person eating meals?: None Help from another person taking care of personal grooming?: A Little Help from another person toileting, which includes using toliet, bedpan, or urinal?: A Lot Help from another person bathing (including washing, rinsing, drying)?: A Lot Help from another person to put on and taking off regular upper body clothing?: A Little Help from another person to put on and taking off regular lower body clothing?: Total 6 Click Score: 15   End of Session Equipment Utilized During Treatment: Oxygen(3L)  Activity Tolerance: Patient tolerated treatment well Patient left: in bed;with call bell/phone within reach;with bed alarm set  OT Visit Diagnosis: Unsteadiness on feet (R26.81)                Time: 4235-3614 OT Time Calculation  (min): 35 min Charges:  OT General Charges $OT Visit: 1 Visit OT Evaluation $OT Eval Moderate Complexity: 1 Mod OT Treatments $Self Care/Home Management : 8-22 mins G-Codes:     2018-06-19 Nestor Lewandowsky, OTR/L Pager: [phone]  Werner Lean, Haze Boyden 2018-06-19, 3:53 PM

## 2018-05-20 NOTE — Progress Notes (Signed)
Cc: Nestor Lewandowsky, RN        enoxaparin (LOVENOX) injection 75 mg $8.90.   Previous Messages    ----- Message -----  From: Carles Collet, RN  Sent: 05/20/2018  2:17 PM  To: Nestor Lewandowsky, RN, *  Subject: benefit check                   Please check   enoxaparin (LOVENOX) injection 75 mg  Dose: 0.5 mg/kg   Thank You

## 2018-05-20 NOTE — Consult Note (Signed)
   [facility] CM Inpatient Consult   05/20/2018  [name] June 23, 1941 [id number]  Referral received for follow up on bedbug issues. Patient assessed for needs.  Spoke with patient at room regarding [employer] services available for post hospital follow up. He endorses that he has [employer] plan.  He states he has limited support for home health benefits.  He states he lives alone and he has a daughter who "is maxed out and has a lot going on in her personal life."  Spoke with him regarding the issues for referral of the bedbugs.  Patient states he has had the apartment treated by [employer] and has been making payments on it.  He states that although they have treated his apartment the bugs are not all gone. He states he does not have his phone which has all of his contacts in it. He also states that he uses [employer] because it's the only reliable transportation he has access to right now.  [place] nurse indicates patient has not been the [facility] in months.  Currently, awaiting PT/OT assessments for disposition needs and transition needs.  Patient states he is not sure what's going to happen as far as his oxygen and if he is going to skilled rehab.  His primary care provider is Asiyah Cletis Media, MD at [facility].  This office is listed to provide the transition of care calls and follow up.  Will follow up with patient for progress, disposition and needs.  [name], RN BSN [facility] Liaison  [phone] business mobile phone Toll free office [phone]      For questions,  please contact:   [name], RN BSN [facility] Liaison  [phone] business mobile phone Toll free office [phone]

## 2018-05-20 NOTE — Discharge Summary (Signed)
[facility] Discharge Summary  Patient name: [name] Medical record number: [id number] Date of birth: Jan 26, 1941 Age: 77 y.o. Gender: male Date of Admission: 05/19/2018  Date of Discharge: 05/26/18  Admitting Physician: Blane Ohara McDiarmid, MD  Primary Care Provider: Tonette Bihari, MD Consultants: Wound Care  Indication for Hospitalization: Dyspnea concerning for CHF  Discharge Diagnoses/Problem List:  Heart Failure with preserved Ejection Fraction, Newly Diagnosed Elephantiasis nostra verrucosa  Bilateral LE Lymphedema Anemia of Chronic Disease T2DM Pressure ulcer of upper posterior thigh, right, stage III [place]illa Intertrigo of abdominal pannus and intergluteal cleft Prostatic Adenocarcinoma:  Right Cheek Mass Infestation by bed bug GERD HLD Onychogryphosis of toes, 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 presented to the hospital complaining of 3 days worsening dyspnea.  His presentation was concerning for pulmonary embolism (PE) vs new onset congestive heart failure.  CTA was negative for evidence of PE.  Given his history of prostatic adenocarcinoma without follow-up and vague abdominal pain, CT of abdomen and pelvis was ordered and was negative for signs of obvious pathology (see imaging results, below).  Patient underwent diuresis with symptomatic improvement and underwent echocardiogram on hospital day 3 which showed grade 2 diastolic dysfunction and LVEF 60-65%.  Patient was diagnosed with HFpEF as the etiology of his initial presentation with  respiratory distress. His weight upon discharge was 308 pounds. PT/OT evaluated patient and recommended CIR. CIR evaluated patient and recommended SNF. Patient adamantly refused SNF. He was discharged home with home O2 2L to keep saturations >90%. Home health PT, OT, and RN was ordered.   Patient was noted to have soft tissue mass of his right cheek which, per chart review, was present in 2015 and thought to be benign process without further workup.  Ultrasound was obtained, results below.  Was nontender and not acutely concerning.   Issues for Follow Up:  1. Soft tissue mass of the cheek- consider further diagnostic imaging with CT/MR 2. History of Prostatic adenocarcinoma- PSA elevated (see results below), further outpatient workup warranted 3. Newly diagnosed HFpEF- reevaluate current medical regimen for management and prevention of worsening disease 4. T2DM- patient had baseline insulin requirements change inpatient (decreased levemir dose, decreased novolog doses), reevaluate for effective glucose control in outpatient setting  5. Patient with bed bug infestation at time of admission, home health services will not come to house until proof of eradication is given  Significant Procedures: None  Significant Labs and Imaging:  Recent Labs  Lab 05/20/18 0426 05/21/18 0214 05/22/18 0532  WBC 9.4 7.9 6.7  HGB 9.1* 9.6* 10.0*  HCT 30.7* 32.8* 32.9*  PLT 265 311 299   Recent Labs  Lab 05/20/18 0426 05/21/18 0214 05/22/18 0532 05/24/18 0037  NA 143 142 139 136  K 3.8 3.6 4.2 4.0  CL 101 96* 91* 87*  CO2 31 36* 40* 38*  GLUCOSE 87 108* 83 137*  BUN 15 12 13 14   CREATININE 0.91 0.98 1.07 1.12  CALCIUM 8.4* 8.5* 8.4* 8.7*  MG  --   --   --  1.7   Prostatic Specific Antigen 0.00 - 4.00 ng/mL  12.94High     ECHOCARDIOGRAM (5/23): - Left ventricle: The cavity size was normal. There was mild   concentric hypertrophy. Systolic function was normal. The   estimated ejection fraction was  in the range of 60% to 65%. Wall   motion was normal; there were no regional wall motion   abnormalities. Features are consistent with a pseudonormal left   ventricular filling pattern, with concomitant abnormal relaxation   and increased filling pressure (grade 2 diastolic dysfunction).   Doppler parameters are consistent with high ventricular filling   pressure. - Aortic valve: Valve mobility was restricted. - Mitral valve: Calcified annulus. Mild focal calcification of the   anterior leaflet. - Left atrium: The atrium was mildly dilated. - Right ventricle: The cavity size was mildly dilated. Wall   thickness was normal. Systolic function was mildly reduced. - Tricuspid valve: There was trivial regurgitation. - Pulmonary arteries: Systolic pressure could not be accurately   estimated.   PORTABLE CHEST 1 VIEW (5/20) FINDINGS: Cardiomegaly with mild central congestion. Minimal atelectasis at the bases. No consolidation or effusion. No pneumothorax.  IMPRESSION: 1. Cardiomegaly with minimal central congestion. 2. Mild bibasilar atelectasis without focal airspace consolidation.   EXAM (5/20) CT ANGIOGRAPHY CHEST  CT ABDOMEN AND PELVIS WITH CONTRAST FINDINGS: CTA CHEST FINDINGS  Cardiovascular: Heart is enlarged. No pericardial effusion. Coronary artery calcification is evident. Atherosclerotic calcification is noted in the wall of the thoracic aorta. Prominence of the main pulmonary arteries suggests pulmonary arterial hypertension. No filling defects identified within the opacified pulmonary arteries.  Mediastinum/Nodes: No mediastinal lymphadenopathy. There is no hilar lymphadenopathy. The esophagus has normal imaging features. There is no axillary lymphadenopathy.  Lungs/Pleura: The central tracheobronchial airways are patent. Atelectasis or scarring noted in the posterior lower lobes bilaterally. No pulmonary edema or pleural effusion.  Musculoskeletal: Bone  windows reveal no worrisome lytic or sclerotic osseous lesions. Bilateral gynecomastia evident.  Review of the MIP images confirms the above findings.  CT ABDOMEN and PELVIS FINDINGS  Hepatobiliary: No focal abnormality within the liver parenchyma. Calcified gallstones evident. No intrahepatic or extrahepatic biliary dilation.  Pancreas: No focal mass lesion. No dilatation of the main duct. No intraparenchymal cyst. No peripancreatic edema.  Spleen: No splenomegaly. No focal mass lesion.  Adrenals/Urinary Tract: No adrenal nodule or mass. Kidneys unremarkable. Proximal and mid ureters show no dilatation. Distal ureters obscured by bilateral hip replacement. Posterior bladder obscured by streak artifact from posterior hip replacement.  Stomach/Bowel: Fundal diverticulum noted in the stomach. Stomach otherwise unremarkable. Duodenum is normally positioned as is the ligament of Treitz. Duodenal diverticulum evident. No small bowel wall thickening. No small bowel dilatation. The terminal ileum is normal. The appendix is not visualized, but there is no edema or inflammation in the region of the cecum. Diverticular changes are noted in the left colon. A portion of the sigmoid segment is obscured by streak artifact but no pericolonic edema or inflammation within the visualized portions of the colon.  Vascular/Lymphatic: There is abdominal aortic atherosclerosis without aneurysm. There is no gastrohepatic or hepatoduodenal ligament lymphadenopathy. No intraperitoneal or retroperitoneal lymphadenopathy. 12 mm short axis left external iliac lymph node is stable since prior study. Similar right external iliac node also unchanged. No groin lymphadenopathy.  Reproductive: Portions of the prostate gland obscured by streak artifact.  Other: No intraperitoneal free fluid.  Musculoskeletal: Small umbilical hernia contains only fat. Patient is status post bilateral hip  replacement with stable appearance of cortical thickening and trabecular coarsening in the bony pelvis suggesting  Paget's disease.  Review of the MIP images confirms the above findings.  IMPRESSION: 1. No CT evidence for acute pulmonary embolus. 2. Mild chronic atelectasis or scarring in the dependent lower lungs bilaterally. No other findings to explain the patient's history of shortness of breath. 3. Cholelithiasis. 4.  Aortic Atherosclerois (ICD10-170.0) 5. Gynecomastia.  EXAM: (5/20) ULTRASOUND OF HEAD/NECK SOFT TISSUES FINDINGS: Grayscale and color duplex ultrasound performed in the region clinical concern.  There is a homogeneously echo act soft tissue mass with lobulated borders hypoechoic to the adjacent soft tissues. No significant internal flow.  IMPRESSION: Nonspecific soft tissue mass in the region of clinical concern. Malignancy not excluded, and further evaluation with either contrast-enhanced CT or contrast-enhanced MR recommended.   Results/Tests Pending at Time of Discharge: none  Discharge Medications:  Allergies as of 05/26/2018      Reactions   Ace Inhibitors Cough   With Lisinopril.   Angiotensin Receptor Blockers Other (See Comments)   Caused excessive weight gain   Metformin Diarrhea      Medication List    STOP taking these medications   mupirocin ointment 2 % Commonly known as:  BACTROBAN   NOVOLOG FLEXPEN 100 UNIT/ML FlexPen Generic drug:  insulin aspart Replaced by:  insulin aspart 100 UNIT/ML injection   silver sulfADIAZINE 1 % cream Commonly known as:  SILVADENE     TAKE these medications   ammonium lactate 12 % lotion Commonly known as:  LAC-HYDRIN Apply topically every morning.   aspirin 81 MG chewable tablet Chew 81 mg by mouth every other day.   EASY TOUCH PEN NEEDLES 31G X 8 MM Misc Generic drug:  Insulin Pen Needle USE WITH FLEXPEN AS DIRECTED   esomeprazole 40 MG capsule Commonly known as:  NEXIUM TAKE 1  CAPSULE BY MOUTH EVERY DAY   FLOVENT HFA 220 MCG/ACT inhaler Generic drug:  fluticasone INHALE 1 PUFF INTO THE LUNGS TWICE DAILY   fluticasone 0.05 % cream Commonly known as:  CUTIVATE Apply 1 application topically 2 (two) times daily. Reported on 05/23/2016   furosemide 80 MG tablet Commonly known as:  LASIX Take 1 tablet (80 mg total) by mouth 2 (two) times daily. What changed:    medication strength  how much to take  when to take this   insulin aspart 100 UNIT/ML injection Commonly known as:  novoLOG Inject 0-9 Units into the skin 3 (three) times daily with meals. Replaces:  NOVOLOG FLEXPEN 100 UNIT/ML FlexPen   Insulin Detemir 100 UNIT/ML Pen Commonly known as:  LEVEMIR FLEXTOUCH Inject 20 Units into the skin daily. What changed:    how much to take  when to take this  Another medication with the same name was removed. Continue taking this medication, and follow the directions you see here.   latanoprost 0.005 % ophthalmic solution Commonly known as:  XALATAN INSTILL 1 DROP IN BOTH EYES EVERY EVENING FOR GLAUCOMA What changed:  Another medication with the same name was removed. Continue taking this medication, and follow the directions you see here.   metoprolol tartrate 100 MG tablet Commonly known as:  LOPRESSOR TAKE 1/2 TABLET BY MOUTH 2 TIMES DAILY.   mometasone 50 MCG/ACT nasal spray Commonly known as:  NASONEX INSTILL 2 SPRAYS INTO THE NOSE DAILY   nystatin powder Commonly known as:  MYCOSTATIN/NYSTOP Apply topically 2 (two) times daily.   ONE TOUCH DELICA LANCING DEV Misc 75 Units by Does not apply route 2 (two) times daily.   ONE TOUCH ULTRA TEST test  strip Generic drug:  glucose blood USE TO CHECK BLOOD SUGAR FOUR TIMES A DAY   ONE TOUCH ULTRA TEST test strip Generic drug:  glucose blood USE TO TEST BLOOD SUGAR TWICE DAILY   ONETOUCH DELICA LANCETS 14G Misc USE TO CHECK BLOOD SUGAR TWICE DAILY.   pravastatin 40 MG tablet Commonly known  as:  PRAVACHOL TAKE 1 TABLET BY MOUTH DAILY   PROAIR HFA 108 (90 Base) MCG/ACT inhaler Generic drug:  albuterol INHALE 2 PUFFS BY MOUTH EVERY 4 HOURS AS NEEDED FOR WHEEZING OR SHORTNESS OF BREATH   tamsulosin 0.4 MG Caps capsule Commonly known as:  FLOMAX TAKE 2 CAPSULES BY MOUTH EVERY MORNING   traMADol 50 MG tablet Commonly known as:  ULTRAM TAKE 1 TABLET BY MOUTH 2 TIMES A DAY AS NEEDED FOR PAIN What changed:  See the new instructions.   triamcinolone ointment 0.1 % Commonly known as:  KENALOG APPLY TO AFFECTED AREA TWICE DAILY AS NEEDED   triamterene-hydrochlorothiazide 37.5-25 MG tablet Commonly known as:  MAXZIDE-25 TAKE 1 TABLET BY MOUTH EVERY DAY FOR BLOOD PRESSURE.            Durable Medical Equipment  (From admission, onward)        Start     Ordered   05/26/18 1237  For home use only DME Walker rolling  Once    Question:  Patient needs a walker to treat with the following condition  Answer:  Ambulatory dysfunction   05/26/18 1236   05/26/18 1233  For home use only DME oxygen  Once    Comments:  O2 when ambulating  Question Answer Comment  Mode or (Route) Nasal cannula   Liters per Minute 2   Frequency Continuous (stationary and portable oxygen unit needed)   Oxygen conserving device Yes   Oxygen delivery system Gas      05/26/18 1232   05/22/18 1511  For home use only DME oxygen  Once    Question:  Oxygen delivery system  Answer:  Gas   05/22/18 1510      Discharge Instructions: Please refer to Patient Instructions section of EMR for full details.  Patient was counseled important signs and symptoms that should prompt return to medical care, changes in medications, dietary instructions, activity restrictions, and follow up appointments.   Follow-Up Appointments: Follow-up Information    Tonette Bihari, MD. Go on 05/28/2018.   Specialty:  Family Medicine Why:  at 3:45 pm Contact information: [place]  [id number] [phone]           Lucila Maine, DO PGY-2, [facility] 05/26/2018 2:13 PM

## 2018-05-20 NOTE — Accreditation Note (Addendum)
Hypoglycemic Event  CBG:68 Treatment:juice Symptoms: none  Follow-up CBG: Time:1150 CBG Result: 78  Possible Reasons for Event: pt did not eat breakfest  Comments/MD notified: Md paged.    Manus Rudd

## 2018-05-20 NOTE — Telephone Encounter (Signed)
Refill ultram 

## 2018-05-20 NOTE — Progress Notes (Signed)
PT Cancellation Note  Patient Details Name: [name] MRN: [id number] DOB: 1941/08/04   Cancelled Treatment:    Reason Eval/Treat Not Completed: Medical issues which prohibited therapy. Pt currently on bipap.   [place] 05/20/2018, 9:52 AM [place]

## 2018-05-21 ENCOUNTER — Inpatient Hospital Stay (HOSPITAL_COMMUNITY): Payer: PPO

## 2018-05-21 DIAGNOSIS — I517 Cardiomegaly: Secondary | ICD-10-CM

## 2018-05-21 DIAGNOSIS — R0602 Shortness of breath: Secondary | ICD-10-CM

## 2018-05-21 LAB — CBC
HCT: 32.8 % — ABNORMAL LOW (ref 39.0–52.0)
HEMOGLOBIN: 9.6 g/dL — AB (ref 13.0–17.0)
MCH: 25.3 pg — ABNORMAL LOW (ref 26.0–34.0)
MCHC: 29.3 g/dL — AB (ref 30.0–36.0)
MCV: 86.5 fL (ref 78.0–100.0)
Platelets: 311 10*3/uL (ref 150–400)
RBC: 3.79 MIL/uL — ABNORMAL LOW (ref 4.22–5.81)
RDW: 14.2 % (ref 11.5–15.5)
WBC: 7.9 10*3/uL (ref 4.0–10.5)

## 2018-05-21 LAB — GLUCOSE, CAPILLARY
GLUCOSE-CAPILLARY: 118 mg/dL — AB (ref 65–99)
GLUCOSE-CAPILLARY: 210 mg/dL — AB (ref 65–99)
GLUCOSE-CAPILLARY: 73 mg/dL (ref 65–99)
GLUCOSE-CAPILLARY: 82 mg/dL (ref 65–99)
Glucose-Capillary: 70 mg/dL (ref 65–99)
Glucose-Capillary: 107 mg/dL — ABNORMAL HIGH (ref 65–99)
Glucose-Capillary: 102 mg/dL — ABNORMAL HIGH (ref 65–99)
Glucose-Capillary: 76 mg/dL (ref 65–99)
Glucose-Capillary: 83 mg/dL (ref 65–99)

## 2018-05-21 LAB — BASIC METABOLIC PANEL
ANION GAP: 10 (ref 5–15)
BUN: 12 mg/dL (ref 6–20)
CALCIUM: 8.5 mg/dL — AB (ref 8.9–10.3)
CO2: 36 mmol/L — ABNORMAL HIGH (ref 22–32)
Chloride: 96 mmol/L — ABNORMAL LOW (ref 101–111)
Creatinine, Ser: 0.98 mg/dL (ref 0.61–1.24)
GFR calc Af Amer: >60 mL/min (ref >60)
GLUCOSE: 108 mg/dL — AB (ref 65–99)
Potassium: 3.6 mmol/L (ref 3.5–5.1)
SODIUM: 142 mmol/L (ref 135–145)

## 2018-05-21 LAB — ECHOCARDIOGRAM COMPLETE
HEIGHTINCHES: 72 in
WEIGHTICAEL: 5343.95 [oz_av]

## 2018-05-21 LAB — TROPONIN I
Troponin I: 0.03 ng/mL (ref <0.03)
Troponin I: 0.07 ng/mL (ref <0.03)

## 2018-05-21 MED ORDER — FUROSEMIDE 10 MG/ML IJ SOLN
60.0000 mg | Freq: Two times a day (BID) | INTRAMUSCULAR | Status: DC
  Administered 2018-05-21 – 2018-05-22 (×2): 60 mg via INTRAVENOUS
  Filled 2018-05-21 (×2): qty 6

## 2018-05-21 NOTE — Progress Notes (Addendum)
PRN Zofran given at start of shift for nausea.  Paged Teaching Service because patient states that he is dizzy tonight and that this is new for him. Patient also appears lethargic this evening, he states that the lethargy is new for him as well. Turned O2 from 4L to 2L as O2 saturation was 100% on 4L.   CBG 82 BP 119/59 MAP 75  Verbal to turn down O2 as long as patient sats above 95%. MD to be up to see patient tonight.   Update 2243: Doctor up to see patient. Patient states that he is feeling better at this time. Will continue to monitor. Down to 1L O2 sats 97%

## 2018-05-21 NOTE — Progress Notes (Addendum)
PT Cancellation Note  Patient Details Name: [name] MRN: [id number] DOB: 06/10/1941   Cancelled Treatment:    Reason Eval/Treat Not Completed: Patient declined, no reason specified. Left crutches in patient's room and will continue attempts.   [place] 05/21/2018, 4:33 PM [employer] PT [phone]

## 2018-05-21 NOTE — Progress Notes (Signed)
PT Cancellation Note  Patient Details Name: [name] MRN: [id number] DOB: [birth year]   Cancelled Treatment:    Reason Eval/Treat Not Completed: Other (comment). Pt reports he is not ready to get up right now. Pt feels he is close to or at baseline with mobility and that he will be able to return to prior living situation.   Shary Decamp [place] 05/21/2018, 9:50 AM  [place]

## 2018-05-21 NOTE — Progress Notes (Signed)
Patient declined to wear NIV this evening, states that it makes him very uncomfortable and does not think he can tolerate it. No distress noted, but understands he will go on it if he were to have a hard time breathing.

## 2018-05-21 NOTE — Progress Notes (Addendum)
Patient complained of dizziness.  BP= 142/67.  CBG=76.  Will encourage patient to eat pudding.  Patient then had large vomiting episode.  Will notify MD.     Ginger ale given.  Night shift RN Kathlee Nations to reassess.

## 2018-05-21 NOTE — Progress Notes (Signed)
  Echocardiogram 2D Echocardiogram has been performed.  Jannett Celestine 05/21/2018, 2:15 PM

## 2018-05-21 NOTE — Progress Notes (Addendum)
Family Medicine Teaching Service Daily Progress Note Intern Pager: [phone]  Patient name: [name] Medical record number: [id number] Date of birth: May 28, 1941 Age: 77 y.o. Gender: male  Primary Care Provider: Tonette Bihari, MD Consultants: none Code Status: FULL  Pt Overview and Major Events to Date:  5/20: patient admitted to teaching service   Assessment and Plan: [name] is a 77yo M presenting with 3 day history of progressively worsening shortness of breath.  PMH significant for T2DM, BLE lymphedema, elephantiasis nostra verrucosa of RLE.   Dyspnea: CTA without evidence of PE.  Will plan to diurese and workup for CHF.  Awaiting echo results for CHF diagnosis.  If abnormal, will consider cardiology consult for further evaluation and potential heart catheterization.  If normal, will pursue other differential etiologies including OSA, pulmonary etiology.  Put out 3L yesterday, appears to be diuresing appropriately. Will continue current regimen and await echo results.  - IV Lasix 40 mg BID - Transthoracic cardiac echocardiogram - Telemetry during diuresis, monitor daily electrolytes on diuresis - Daily Weights - Strict I/O's - aspirin 81mg  qhs  Chest Pain: Patient complained of L side CP overnight. Initial trop, EKG neg. Trop this AM up to 0.07, may be related to demand ischemia as patient denied NIV last night.  Repeat AM EKG notable for potential nonspecific T wave changes. Need echo to evaluate for structural changes, may need to consult cardiology is CP continues/worsens.  - Trend top q6 x3 - STAT trop, EKG if chest pain  Elephantiasis nostra verrucosa  Bilateral LE Lymphedema: Has not seen wound clinic for the past three months.  Not on ABX.  - Diuresing as above - per wound care - Ammonium lactate 12% lotion  Normocytic Anemia of Chronic Disease: Unknown etiology. Ferritin normal.  Iron studies pattern consistent with AoCD.  Will consider causes of chronic  inflammation, monitor Hgb/Hct, however at this time no further treatment and transfusion not indicated.  - daily CBC  T2DM: Last Ha1c per chart review in March 2018, was 14.  Ha1c is 9.3 on admission.  CBG's elevated. Patient currently takes 48U levemir BID for long acting. - resistant SSI - continue long acting regimen of levemir 48U bid  Pressure ulcer of upper posterior thigh, right, stage III - Wound care per Wound Care service  Monilla Intertrigo of abdominal pannus and intergluteal cleft  - Nystatin powder bid  Lives alone with limited help - [facility] consultation - OT recommends home health OT  Prostatic Adenocarcinoma: Per patient, urologist is [facility], per their office his last visit was Dec 2017.  CT abdomen/pelvis negative for any obvious pathology concerning for metastatic cancer, however prostate was not entirely visualized.  This appears to not be concerning for acute process, recommend further outpatient work-up.  - Outpatient follow-up warranted - continue flomax  Right Cheek Mass: States it has been present for 2-3 years.  Had it evaluated in 2015 which attending felt it was cyst vs blocked duct vs fatty mass. Nontender to palpation.  Ultrasound nonspecific and suggested further evaluation with CT/MR.  Appears to be stable at this time, will recommend further outpatient workup.   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 H/o Prostatic Adenocarcinoma: Flomax  FEN/GI: heart healthy, carb-modified diet PPx: Lovenox @ 0.5mg /kg  Disposition: pending   Subjective: Interval event: overnight complaint of chest pain, initial troponin and EKG  negative  Objective: Temp:  [98.2 F (36.8 C)-99 F (37.2 C)] 98.3 F (36.8 C) (05/23 0438) Pulse Rate:  [64-83] 81 (05/23 0438) Resp:  [14-16] 16 (05/22 1623) BP: (96-123)/(52-70)  110/70 (05/23 0438) SpO2:  [93 %-100 %] 100 % (05/23 0438) FiO2 (%):  [35 %] 35 % (05/22 0824) Weight:  [334 lb (151.5 kg)] 334 lb (151.5 kg) (05/23 0438) Physical Exam: General: morbidly obese male, cooperative on exam, on [place] HEENT: firm, mobile, subcutaneous mass that is nontender to palpation anterior to parotid on R cheek Cardiovascular: RRR, nl S1,S2, III/VI holosystolic blowing murmur heard best over the RUSB, JVD elevated to 5cm above clavicle, radial pulses equal and intact Respiratory: diminished breath sounds over bilateral mid and lower lobes. No crackles or ronchi. Normal WoB. Desaturated to 80's when asked to move during exam.  Abdomen: BS+, nontender to palpation Extremities: No pitting edema noted. Bilateral dermal changes over calves with scarring and consistent with stasis dermatitis.  RLE with skin changes and keratotic changes consistent with elephantiasis.   Laboratory: Recent Labs  Lab 05/19/18 0151 05/19/18 0201 05/20/18 0426 05/21/18 0214  WBC 10.0  --  9.4 7.9  HGB 9.2* 9.9* 9.1* 9.6*  HCT 30.4* 29.0* 30.7* 32.8*  PLT 290  --  265 311   Recent Labs  Lab 05/19/18 0151 05/19/18 0201 05/19/18 0848 05/20/18 0426 05/21/18 0214  NA 140 141  --  143 142  K 3.8 3.7  --  3.8 3.6  CL 105 101  --  101 96*  CO2 28  --   --  31 36*  BUN 17 20  --  15 12  CREATININE 0.99 0.90  --  0.91 0.98  CALCIUM 8.6*  --   --  8.4* 8.5*  PROT  --   --  8.5*  --   --   BILITOT  --   --  0.5  --   --   ALKPHOS  --   --  120  --   --   ALT  --   --  12*  --   --   AST  --   --  17  --   --   GLUCOSE 176* 179*  --  87 108*     Roche, Heidi A, Medical Student 05/21/2018, 7:56 AM MS4, [place] Intern pager: [phone], text pages welcome  I have personally seen and examined this patient with Heidi Roche and agree with the above note. The following is my additional documentation.   Physical Exam: General: lying in bed comfortably watching TV, NAD with  non-toxic appearance HEENT: normocephalic, atraumatic, moist mucous membranes Neck: supple, JVD present up to mid-neck Cardiovascular: regular rate and rhythm without murmurs, rubs, or gallops Lungs: clear to auscultation bilaterally with normal work of breathing on 4 L [place] Abdomen: morbidly obese, soft, non-tender, non-distended, normoactive bowel sounds Skin: warm, dry, no rashes or lesions, cap refill < 2 seconds Extremities: warm and well perfused, normal tone, chronic LE pitting with skin changes to right foot  Assessment/Plan Elvina Mattes Brownis a 76 y.o.malepresenting with new onset progressive shortness of breath concerning for new onset heart failure. PMH significant for diabetes, chronic lymphedema with elephantitis of right lower extremity, morbid obesity, anemia of chronic disease, prostatic adenocarcinoma with poor follow-up, hyperlipidemia, GERD.  Patient tolerating nasal cannula now.  Does endorse some chest pain which is reproducible on his left inferior rib cage radiating to his left scapula.  Patient denies  shortness of breath.  1.Shortness of breath  Hypoxia:Acute. Tolerating nasal cannula. CXR without evidence of focal infiltrate. CTA neg for PE.  BNP 43 on arrival the patient's morbid obesity may cause false negative. Patient is on chronic PO Lasix 80 mg daily for lymphedema and is having adequate urine output with 3.3 L within the last 24 hours.  Urine Foley in place. Suspect there may be a component of OSA and OHSgiven morbid obesity. Plan to continue diuresis and monitor on telemetry with daily weights and strict I&O's.  Will continue IV Lasix 80 mg BID. ECHO pending.  Patient does have new-onset left-sided chest pain though troponins and EKG have been unremarkable.  We will continue monitoring for signs of ACS.  Mild bump in troponin likely representing stress-induced.  2.Elephantitis and bilateral lower lymphedema:Chronic. No signs of secondary bacterial  infection. Continue ammonium lactate 12% lotion.Wound care consulted. Diuresing per above.  3.Iron deficiency anemia:Chronic.  Hemoglobin stable. Studies with low iron and saturation and appropriate ferritin.  No signs of active bleeding.  4.Diabetes type 2:Chronic.  Uncontrolled.  A1c 9.3. Home medications include Levemir 48 units twice daily. Continue home regimen with resistance sliding scale.  5.Prostatic adenocarcinoma:Has recent lack of follow-up with urology. Will check PSA. No signs of metastasis based on CT of pelvis and abdomen. On Flomax. Having appropriate urine output.  Dispo: Pending improvement of shortness of breath and work-up for possible new onset heart failure while diuresing.    Harriet [place], [place], PGY-2

## 2018-05-21 NOTE — Progress Notes (Signed)
Patient stating that he is having CP started 9/10, on the left side and radiating towards back area.  Placed patient on 4 L of O2 and EKG  Is being done and MD has been notified.    Per MD will be ordering some troponins and another EKG will be done in the morning.

## 2018-05-22 LAB — CBC
HCT: 32.9 % — ABNORMAL LOW (ref 39.0–52.0)
Hemoglobin: 10 g/dL — ABNORMAL LOW (ref 13.0–17.0)
MCH: 25.7 pg — ABNORMAL LOW (ref 26.0–34.0)
MCHC: 30.4 g/dL (ref 30.0–36.0)
MCV: 84.6 fL (ref 78.0–100.0)
PLATELETS: 299 10*3/uL (ref 150–400)
RBC: 3.89 MIL/uL — ABNORMAL LOW (ref 4.22–5.81)
RDW: 14 % (ref 11.5–15.5)
WBC: 6.7 10*3/uL (ref 4.0–10.5)

## 2018-05-22 LAB — GLUCOSE, CAPILLARY
GLUCOSE-CAPILLARY: 115 mg/dL — AB (ref 65–99)
GLUCOSE-CAPILLARY: 129 mg/dL — AB (ref 65–99)
GLUCOSE-CAPILLARY: 60 mg/dL — AB (ref 65–99)
GLUCOSE-CAPILLARY: 69 mg/dL (ref 65–99)
GLUCOSE-CAPILLARY: 85 mg/dL (ref 65–99)
Glucose-Capillary: 73 mg/dL (ref 65–99)
Glucose-Capillary: 56 mg/dL — ABNORMAL LOW (ref 65–99)
Glucose-Capillary: 72 mg/dL (ref 65–99)
Glucose-Capillary: 48 mg/dL — ABNORMAL LOW (ref 65–99)
Glucose-Capillary: 87 mg/dL (ref 65–99)

## 2018-05-22 LAB — BASIC METABOLIC PANEL
ANION GAP: 8 (ref 5–15)
BUN: 13 mg/dL (ref 6–20)
CALCIUM: 8.4 mg/dL — AB (ref 8.9–10.3)
CO2: 40 mmol/L — AB (ref 22–32)
CREATININE: 1.07 mg/dL (ref 0.61–1.24)
Chloride: 91 mmol/L — ABNORMAL LOW (ref 101–111)
GFR calc Af Amer: >60 mL/min (ref >60)
GLUCOSE: 83 mg/dL (ref 65–99)
Potassium: 4.2 mmol/L (ref 3.5–5.1)
Sodium: 139 mmol/L (ref 135–145)

## 2018-05-22 MED ORDER — FUROSEMIDE 80 MG PO TABS
80.0000 mg | ORAL_TABLET | Freq: Two times a day (BID) | ORAL | Status: DC
  Administered 2018-05-22 – 2018-05-27 (×10): 80 mg via ORAL
  Filled 2018-05-22 (×10): qty 1

## 2018-05-22 MED ORDER — INSULIN ASPART 100 UNIT/ML ~~LOC~~ SOLN
0.0000 [IU] | Freq: Three times a day (TID) | SUBCUTANEOUS | Status: DC
  Administered 2018-05-22 – 2018-05-23 (×2): 1 [IU] via SUBCUTANEOUS
  Administered 2018-05-23: 2 [IU] via SUBCUTANEOUS
  Administered 2018-05-24: 1 [IU] via SUBCUTANEOUS
  Administered 2018-05-24: 2 [IU] via SUBCUTANEOUS
  Administered 2018-05-24: 1 [IU] via SUBCUTANEOUS
  Administered 2018-05-25: 2 [IU] via SUBCUTANEOUS
  Administered 2018-05-25 – 2018-05-26 (×4): 1 [IU] via SUBCUTANEOUS
  Administered 2018-05-26 – 2018-05-27 (×4): 2 [IU] via SUBCUTANEOUS
  Administered 2018-05-28 (×3): 1 [IU] via SUBCUTANEOUS
  Administered 2018-05-29: 2 [IU] via SUBCUTANEOUS
  Administered 2018-05-29: 1 [IU] via SUBCUTANEOUS

## 2018-05-22 MED ORDER — INSULIN DETEMIR 100 UNIT/ML ~~LOC~~ SOLN
24.0000 [IU] | Freq: Two times a day (BID) | SUBCUTANEOUS | Status: DC
  Filled 2018-05-22 (×3): qty 0.24

## 2018-05-22 MED ORDER — DEXTROSE 50 % IV SOLN
INTRAVENOUS | Status: AC
  Administered 2018-05-22: 25 mL
  Filled 2018-05-22: qty 50

## 2018-05-22 NOTE — Progress Notes (Signed)
RT came by twice to do neb tx . Pt unavailable at both times. MD at the bedside first attempt. Second attempt pt getting a bath. Staff at the bedside

## 2018-05-22 NOTE — Care Management Note (Addendum)
Case Management Note  Patient Details  Name: [name] MRN: [id number] Date of Birth: 07-05-1941  Subjective/Objective:  Pt presented for Dyspnea- newly diagnoses CHF. PTA from home alone. He lives in the [place]. Per staff pt has bed bugs- CM did try to cal the [place] to see if the Apartment has been exterminated. Unable to get in touch with management- will not be back in until Wed of next week. So, unsure if treatment has began. Pt is refusing SNF- CM did reach out to physician earlier to see if they could place inpatient rehab consult. Dr stated that consult will not be placed because pt wants to return home.               Action/Plan: If home has not been exterminated no [place] will be able to make visits in the home. Pt has a motorized scooter that he uses in the Building. CM did reach out to MD in regards to situation. Daughter is at the bedside and she has questions for MD- CM did call and someone will come up to answer questions. Staff RN to see if pt qualifies for home 02 sitting (not ambulation). Weekend CM will need to f/u for DME needs and [facility]. If pt is transitioned home over the weekend- Pt can follow up from PCP Office for [place] once the apartment has been exterminated.    Expected Discharge Date:                  Expected Discharge Plan:  [place]  In-House Referral:  [facility]  Discharge planning Services  CM Consult  Post Acute Care Choice:  Home Health, Durable Medical Equipment Choice offered to:  Patient  DME Arranged:  Oxygen DME Agency:   HH Arranged:  RN, Disease Management, PT, OT, Nurse's Aide, Refused SNF, [facility] Agency:     Status of Service:  In process, will continue to follow  If discussed at Long Length of Stay Meetings, dates discussed:    Additional Comments:  Bethena Roys, RN 05/22/2018, 4:15 PM

## 2018-05-22 NOTE — Progress Notes (Signed)
Occupational Therapy Treatment Patient Details Name: [name] MRN: [id number] DOB: April 26, 1941 Today's Date: 05/22/2018    History of present illness [name] is a 77yo M presenting with 3 day history of progressively worsening shortness of breath.  PMH significant for T2DM, BLE lymphedema, elephantiasis nostra verrucosa of RLE.    OT comments  Pt with excellent effort. Able to clear buttocks from bed using sara stedy with 2 person assist and bed elevated, but not stand up. Pt very disappointed in his level of weakness. Pt continues to need assist for LEs back into bed. Aware he could not manage at home and agreeable to post acute rehab. Will continue to follow.  Follow Up Recommendations  CIR    Equipment Recommendations       Recommendations for Other Services      Precautions / Restrictions Precautions Precautions: Fall       Mobility Bed Mobility Overal bed mobility: Needs Assistance Bed Mobility: Supine to Sit;Sit to Supine     Supine to sit: Min assist Sit to supine: Mod assist   General bed mobility comments: assist to raise trunk, increased time, assist for B LEs back into bed  Transfers Overall transfer level: Needs assistance   Transfers: Sit to/from Stand Sit to Stand: From elevated surface         General transfer comment: attempted stand multiple times from elevated bed with use of sara stedy, pt able to clear his buttocks, but not stand    Balance     Sitting balance-Leahy Scale: Good                                     ADL either performed or assessed with clinical judgement   ADL Overall ADL's : Needs assistance/impaired     Grooming: Wash/dry hands;Wash/dry face;Sitting   Upper Body Bathing: Minimal assistance;Sitting       Upper Body Dressing : Minimal assistance;Sitting   Lower Body Dressing: Total assistance;Bed level                       Vision       Perception     Praxis       Cognition Arousal/Alertness: Awake/alert Behavior During Therapy: Flat affect Overall Cognitive Status: Within Functional Limits for tasks assessed                                          Exercises     Shoulder Instructions       General Comments      Pertinent Vitals/ Pain       Pain Assessment: No/denies pain  Home Living                                          Prior Functioning/Environment              Frequency  Min 2X/week        Progress Toward Goals  OT Goals(current goals can now be found in the care plan section)  Progress towards OT goals: Not progressing toward goals - comment(weakness)  Acute Rehab OT Goals Patient Stated Goal: to return home OT Goal Formulation: With patient Time For  Goal Achievement: 06/03/18 Potential to Achieve Goals: Good  Plan Discharge plan needs to be updated    Co-evaluation                 AM-PAC PT "6 Clicks" Daily Activity     Outcome Measure   Help from another person eating meals?: None Help from another person taking care of personal grooming?: A Little Help from another person toileting, which includes using toliet, bedpan, or urinal?: Total Help from another person bathing (including washing, rinsing, drying)?: A Lot Help from another person to put on and taking off regular upper body clothing?: A Little Help from another person to put on and taking off regular lower body clothing?: Total 6 Click Score: 14    End of Session Equipment Utilized During Treatment: Gait belt;Oxygen  OT Visit Diagnosis: Unsteadiness on feet (R26.81);Muscle weakness (generalized) (M62.81)   Activity Tolerance Patient limited by fatigue   Patient Left in bed;with call bell/phone within reach;with bed alarm set   Nurse Communication          Time: 0630-1601 OT Time Calculation (min): 45 min  Charges: OT General Charges $OT Visit: 1 Visit OT Treatments $Self Care/Home  Management : 8-22 mins $Therapeutic Activity: 23-37 mins  05/22/2018 Nestor Lewandowsky, OTR/L Pager: [phone]   Werner Lean Haze Boyden 05/22/2018, 3:14 PM

## 2018-05-22 NOTE — Progress Notes (Signed)
Inpatient Rehabilitation  Per PT request, patient was screened by Gunnar Fusi for appropriateness for an Inpatient Acute Rehab consult.  At this time we are recommending an Inpatient Rehab consult if patient/family in agreement with plan.  Please order if appropraite.    Carmelia Roller., CCC/SLP Admission Coordinator  [place]  Cell [phone]

## 2018-05-22 NOTE — Plan of Care (Signed)
Acute PT goals established. 

## 2018-05-22 NOTE — Progress Notes (Signed)
SATURATION QUALIFICATIONS: (This note is used to comply with regulatory documentation for home oxygen)  Patient Saturations on Room Air at Rest = NA%  Patient Saturations on Room Air while Ambulating = NA%  Patient Saturations on 1 Liters of oxygen while Ambulating = 85%  While pt was laying flat to be cleaned up on 1 L, O2 sat was 85%. After sitting back up again and taking deep breaths pt recovered after about 1 min to 95%. While trialing .5L O2, pt was consistently at 83% even after deep breaths and did not recover. O2 was then turned back up to 1L pt recovered to 96% after about 30 sec.

## 2018-05-22 NOTE — Progress Notes (Signed)
Hypoglycemic Event  CBG: 56  Treatment: 15 GM carbohydrate snack  Symptoms: None  Follow-up CBG: Time: 0431 CBG Result: 72  Possible Reasons for Event: Other: lantus dose need adjusting  Comments/MD notified: will pass on to day shift RN, encourage 15 GM snack     [name] A Kratos Ruscitti

## 2018-05-22 NOTE — Evaluation (Signed)
Physical Therapy Evaluation Patient Details Name: [name] MRN: [id number] DOB: 11/17/1941 Today's Date: 05/22/2018   History of Present Illness  [name] is a 77yo M presenting with 3 day history of progressively worsening shortness of breath.  PMH significant for T2DM, BLE lymphedema, elephantiasis nostra verrucosa of RLE.   Clinical Impression  Pt admitted with above complications. Pt currently with functional limitations due to the deficits listed below (see PT Problem List). Unable to transfer out of bed due to weakness. PTA patient using crutches to transfer from bed to motorized wheelchair. He requires significant assistance to return to bed and has very limited help from family and friends, living alone. Based on today's assessment I do not think pt would be able to safely mobilize at home without a high risk of falling. He also reports that his leg rests do not fit on his wheelchair and this resulted in his LE wounds, but he has been unable to contact the medical supply store to change his equipment. He also seems resistant to the idea of rehabilitation unfortunately but clearly has poor insight into his limitations and deficits. I suspect he would progress quickly with CIR if he is willing to undergo the process to assist him with returning to his PLOF. If he declines, SNF would be a safe option. Pt will benefit from skilled PT to increase their independence and safety with mobility to allow discharge to the venue listed below.     Follow Up Recommendations CIR    Equipment Recommendations  None recommended by PT    Recommendations for Other Services       Precautions / Restrictions Precautions Precautions: Fall Restrictions Weight Bearing Restrictions: No      Mobility  Bed Mobility Overal bed mobility: Needs Assistance Bed Mobility: Rolling;Sidelying to Sit;Sit to Supine Rolling: Supervision Sidelying to sit: Supervision Supine to sit: HOB  elevated;Supervision Sit to supine: Mod assist   General bed mobility comments: Required a significant amount of time and with moderate difficulty pt able to approach EOB without physical assist, yet using rails and verbal cues for technique. Mod assist to return to bed for LE support and to scoot backwards onto bed using bed pad. Rolls without physical assist using rails with VC.  Transfers Overall transfer level: Needs assistance Equipment used: 1 person hand held assist;Crutches Transfers: Sit to/from Stand Sit to Stand: Max assist;From elevated surface         General transfer comment: Attempted to stand multiple times but unsuccessful. Enviornment set up for pt to simulate common transfer technique that he uses at home with crutches but was unable. Also attempt with physical assist, elevated bed, and UE support on chair. Max VC for technique however pt too weak to perform this date.  Ambulation/Gait                Stairs            Wheelchair Mobility    Modified Rankin (Stroke Patients Only)       Balance Overall balance assessment: Needs assistance Sitting-balance support: No upper extremity supported Sitting balance-Leahy Scale: Good                                       Pertinent Vitals/Pain Pain Assessment: No/denies pain    Home Living Family/patient expects to be discharged to:: Private residence Living Arrangements: Alone Available Help at Discharge: Friend(s);Available  PRN/intermittently;Family Type of Home: Apartment Home Access: Elevator       Home Equipment: Wheelchair - power;Crutches;Adaptive equipment;Hospital bed Additional Comments: reports the leg rests of his w/c are in disrepair    Prior Function Level of Independence: Independent with assistive device(s)         Comments: transfers independently to motorized w/c, sponge bathes and dresses independently using reacher, friends get groceries, uses SCAT to get to  grocery store     Hand Dominance   Dominant Hand: Right    Extremity/Trunk Assessment   Upper Extremity Assessment Upper Extremity Assessment: Defer to OT evaluation    Lower Extremity Assessment Lower Extremity Assessment: Generalized weakness(Lt knee contracture, unable to flex fully)       Communication   Communication: No difficulties  Cognition Arousal/Alertness: Lethargic Behavior During Therapy: WFL for tasks assessed/performed Overall Cognitive Status: Impaired/Different from baseline Area of Impairment: Safety/judgement                         Safety/Judgement: Decreased awareness of safety;Decreased awareness of deficits            General Comments General comments (skin integrity, edema, etc.): SpO2 with poor waveform ranged 85-90 on room air, up to 95% on 1L supplemental O2 during session.    Exercises     Assessment/Plan    PT Assessment Patient needs continued PT services  PT Problem List Decreased strength;Decreased range of motion;Decreased activity tolerance;Decreased balance;Decreased mobility;Decreased cognition;Decreased knowledge of use of DME;Decreased knowledge of precautions;Decreased safety awareness;Cardiopulmonary status limiting activity;Obesity;Decreased skin integrity       PT Treatment Interventions DME instruction;Functional mobility training;Therapeutic activities;Therapeutic exercise;Balance training;Neuromuscular re-education;Patient/family education;Cognitive remediation    PT Goals (Current goals can be found in the Care Plan section)  Acute Rehab PT Goals Patient Stated Goal: to return home PT Goal Formulation: With patient Time For Goal Achievement: 06/05/18 Potential to Achieve Goals: Good    Frequency Min 3X/week   Barriers to discharge Decreased caregiver support lives alone    Co-evaluation               AM-PAC PT "6 Clicks" Daily Activity  Outcome Measure Difficulty turning over in bed  (including adjusting bedclothes, sheets and blankets)?: A Little Difficulty moving from lying on back to sitting on the side of the bed? : A Lot Difficulty sitting down on and standing up from a chair with arms (e.g., wheelchair, bedside commode, etc,.)?: Unable Help needed moving to and from a bed to chair (including a wheelchair)?: Total Help needed walking in hospital room?: Total Help needed climbing 3-5 steps with a railing? : Total 6 Click Score: 9    End of Session Equipment Utilized During Treatment: Gait belt;Oxygen Activity Tolerance: Other (comment)(Limite due to weakness.) Patient left: in bed;with call bell/phone within reach;with nursing/sitter in room Nurse Communication: Mobility status PT Visit Diagnosis: Muscle weakness (generalized) (M62.81);Difficulty in walking, not elsewhere classified (R26.2)    Time: 3382-5053 PT Time Calculation (min) (ACUTE ONLY): 41 min   Charges:   PT Evaluation $PT Eval High Complexity: 1 High PT Treatments $Therapeutic Activity: 23-37 mins   PT G Codes:        Elayne Snare, PT, DPT  Ellouise Newer 05/22/2018, 11:46 AM

## 2018-05-22 NOTE — Progress Notes (Addendum)
Hypoglycemic Event  CBG: 69   Treatment: 4 oz OJ Symptoms: None  Follow-up CBG: Time: 1236 CBG Result: 60  Follow-up CBG: Time: 1259 CBG Result: 73  Possible Reasons for Event: Lantus dose  Comments/MD notified:  Will pass on to day shift RN, will continue to monitor patient. Encouraged intake of regular soda to prevent hypoglycemia.    Zerina Hallinan A Dama Hedgepeth

## 2018-05-22 NOTE — Progress Notes (Addendum)
Family Medicine Teaching Service Daily Progress Note Intern Pager: [phone]  Patient name: [name] Medical record number: [id number] Date of birth: 01/30/1941 Age: 77 y.o. Gender: male  Primary Care Provider: Tonette Bihari, MD Consultants: none Code Status: FULL  Pt Overview and Major Events to Date:  5/20: patient admitted to teaching service 5/23: Echo notable for G2DD, EF 60-65%  Assessment and Plan: [name] is a 77yo M presenting with 3 day history of progressively worsening shortness of breath.  PMH significant for T2DM, BLE lymphedema, elephantiasis nostra verrucosa of RLE.   CHF, newly diagnosed: Echo: grade 2 diastolic dysfunction, LVED 60-65%.  This could be early stages of HFpEF, and/or pulmonary hypertension although unable to diagnose with the measurements from his echo. Put out 1.5L yesterday, appears to have slowed in diuresing. Scr up to 1.07 from 0.98 on admission, perhaps we have hit relatively dry weight for him.  Will plan to call this CHF and switch to increased PO lasix dose in anticipation of discharge.  Regarding discharge disposition, see below.  - Switch to PO Lasix 80 mg BID - CSW involved, appreciate assistance - D/c telemetry - Daily Weights - Strict I/O's - aspirin 81mg  qhs  Lives alone with limited help: Patient initially adamantly refusing inpatient rehab, however was eventually able to agree to discuss it with his daughter as the team feels strongly this would be beneficial given his newly diagnosed CHF.  Otherwise plan for home PT if patient is amenable. Regardless patient will need home oxygen.  - CSW consulted, appreciate recs - Evaluate oxygen requirements - [facility] consultation - OT recommends home health OT  Chest Pain, resolved: Patient had episode of chest pain with negative troponin x2. Has not recurred.   Elephantiasis nostra verrucosa  Bilateral LE Lymphedema: Has not seen wound clinic for the past three months.  Not on ABX.   - Diuresing as above - per wound care - Ammonium lactate 12% lotion  Anemia of Chronic Disease: Stable. Unknown etiology, could be secondary to possible CHF. - daily CBC  T2DM: Last Ha1c per chart review in March 2018, was 14.  Ha1c is 9.3 on admission.  CBG's elevated. Patient currently takes 48U levemir BID for long acting.  Has had some lows throughout admission, will decrease levemir.  - resistant SSI - Decrease levemir to 24U bid  Pressure ulcer of upper posterior thigh, right, stage III - Wound care per Wound Care service  Monilla Intertrigo of abdominal pannus and intergluteal cleft  - Nystatin powder bid  Prostatic Adenocarcinoma: Per patient, urologist is [facility], per their office his last visit was Dec 2017.  CT abdomen/pelvis negative for any obvious pathology concerning for metastatic cancer, however prostate was not entirely visualized.  This appears to not be concerning for acute process, recommend further outpatient work-up.  - Outpatient follow-up warranted - continue flomax  Right Cheek Mass: See prior notes.  Appears to be stable at this time, will recommend further outpatient workup.   Infestation by bed bug Appears to be resolved. Patient reports bed bugs "should be gone by now," although unclear if he actually had professional help come in. [facility] will investigate, appreciate assistance.   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  Objective: Temp:  [97.9 F (36.6 C)-99.8 F (37.7 C)] 99.8 F (37.7 C) (05/24 0745) Pulse Rate:  [69-79] 79 (05/24 0400) Resp:  [18-20] 20 (05/24 0400) BP: (110-130)/(54-72) 130/67 (05/24 0745) SpO2:   [93 %-100 %] 95 % (05/24 0745) Physical Exam: General: morbidly obese male, cooperative on exam, sitting comfortably with [place] off face HEENT: firm, mobile, subcutaneous mass that is nontender to palpation anterior to parotid on R cheek Cardiovascular: RRR, nl S1,S2, II/VI holosystolic blowing murmur heard best over the RUSB, radial pulses equal and intact Respiratory: diminished breath sounds over bilateral mid and lower lobes, likely limited by body habitus. No crackles or ronchi. Normal WoB.  Abdomen: BS+, nontender to palpation Extremities: Bilateral legs wrapped by wound care.  Laboratory: Recent Labs  Lab 05/20/18 0426 05/21/18 0214 05/22/18 0532  WBC 9.4 7.9 6.7  HGB 9.1* 9.6* 10.0*  HCT 30.7* 32.8* 32.9*  PLT 265 311 299   Recent Labs  Lab 05/19/18 0848 05/20/18 0426 05/21/18 0214 05/22/18 0532  NA  --  143 142 139  K  --  3.8 3.6 4.2  CL  --  101 96* 91*  CO2  --  31 36* 40*  BUN  --  15 12 13   CREATININE  --  0.91 0.98 1.07  CALCIUM  --  8.4* 8.5* 8.4*  PROT 8.5*  --   --   --   BILITOT 0.5  --   --   --   ALKPHOS 120  --   --   --   ALT 12*  --   --   --   AST 17  --   --   --   GLUCOSE  --  87 108* 83   - Left ventricle: The cavity size was normal. There was mild   concentric hypertrophy. Systolic function was normal. The   estimated ejection fraction was in the range of 60% to 65%. Wall   motion was normal; there were no regional wall motion   abnormalities. Features are consistent with a pseudonormal left   ventricular filling pattern, with concomitant abnormal relaxation   and increased filling pressure (grade 2 diastolic dysfunction).   Doppler parameters are consistent with high ventricular filling   pressure. - Aortic valve: Valve mobility was restricted. - Mitral valve: Calcified annulus. Mild focal calcification of the   anterior leaflet. - Left atrium: The atrium was mildly dilated. - Right ventricle: The cavity size was mildly dilated. Wall    thickness was normal. Systolic function was mildly reduced. - Tricuspid valve: There was trivial regurgitation. - Pulmonary arteries: Systolic pressure could not be accurately   estimated.  Roche, Sallyanne Havers, Medical Student 05/22/2018, 8:14 AM MS4, [place] Intern pager: [phone], text pages welcome    I have personally seen and examined this patient with [facility] Roche and agree with the above note. The following is my additional documentation.   Physical Exam: General: lying in bed comfortably, well developed, NAD with non-toxic appearance HEENT: normocephalic, atraumatic, moist mucous membranes Neck: supple, mild JVD Cardiovascular: regular rate and rhythm with systolic murmur Lungs: clear to auscultation bilaterally with normal work of breathing on 1 L [place] though [place] is not in proper place Abdomen: soft, non-tender, non-distended, normoactive bowel sounds Skin: warm, dry, no rashes or lesions, cap refill < 2 seconds Extremities: warm and well perfused, normal tone, chronic skin changes over 2+ LE edema bilaterally   Assessment/Plan Joel Mattes Brownis a 76 y.o.malepresenting with  new onset progressive shortness of breath concerning for new onset heart failure. PMH significant for diabetes, chronic lymphedema with elephantitis of right lower extremity, morbid obesity, anemia of chronic disease, prostatic adenocarcinoma with poor follow-up, hyperlipidemia, GERD.  Patient continues to tolerate minimal oxygen on nasal cannula.  Patient states chest pain resolved overnight.  He is still adamant about returning home following discharge.  He was agreeable to discuss with his daughter regarding SNF placement.  Patient has no other complaints.  1.Shortness of breath Hypoxia:Acute. Tolerating 1L [place] and intermittently drops to 87% on room air. CXR without evidence of focal infiltrate.CTA neg for PE.BNP 43 on arrival the patient's morbid obesity may cause false  negative. Patient is on chronicPOLasix80 mg dailyfor lymphedema and is having adequate urine output with 1.475 L within the last 24 hours.  Urine Foley in place. Suspect there may be a component of OSA and OHSgiven morbid obesity. Plan to continue diuresis and monitor on telemetry with daily weights and strict I&O's.Will switch to PO Lasix 80 mg twice daily. ECHO with EF 60-65% and G2DD.    Chest pain resolved with plateaued troponins and an unremarkable EKGs.  Suspect underlying etiology is diastolic heart failure and suspect pulmonary hypertension may be playing a role.  2.Elephantitis and bilateral lower lymphedema:Chronic. No signs of secondary bacterial infection. Continue ammonium lactate 12% lotion.Wound care consulted. Diuresing per above.  3.Iron deficiency anemia:Chronic.  Hemoglobin stable. Studies with low iron and saturation and appropriate ferritin.  No signs of active bleeding.  4.Diabetes type 2:Chronic.  Uncontrolled.  A1c 9.3. Home medications include Levemir 48 units twice daily. Continue home regimen with resistance sliding scale.  5.Prostatic adenocarcinoma:Has recent lack of follow-up with urology. Will check PSA. No signs of metastasis based on CT of pelvis and abdomen. On Flomax. Having appropriate urine output.  Dispo: Pending improvement of shortness of breath and work-up for possible new onset heart failure while diuresing.    Harriet [place]te, [place], PGY-2

## 2018-05-23 LAB — GLUCOSE, CAPILLARY
GLUCOSE-CAPILLARY: 132 mg/dL — AB (ref 65–99)
GLUCOSE-CAPILLARY: 83 mg/dL (ref 65–99)
Glucose-Capillary: 96 mg/dL (ref 65–99)
Glucose-Capillary: 97 mg/dL (ref 65–99)
Glucose-Capillary: 95 mg/dL (ref 65–99)
Glucose-Capillary: 142 mg/dL — ABNORMAL HIGH (ref 65–99)
Glucose-Capillary: 156 mg/dL — ABNORMAL HIGH (ref 65–99)
Glucose-Capillary: 126 mg/dL — ABNORMAL HIGH (ref 65–99)

## 2018-05-23 MED ORDER — INSULIN ASPART 100 UNIT/ML ~~LOC~~ SOLN: 0.0000 [IU] | Freq: Three times a day (TID) | SUBCUTANEOUS | 11 refills | Status: DC

## 2018-05-23 MED ORDER — AMMONIUM LACTATE 12 % EX LOTN: TOPICAL_LOTION | Freq: Every morning | CUTANEOUS | 0 refills | Status: DC

## 2018-05-23 MED ORDER — INSULIN DETEMIR 100 UNIT/ML ~~LOC~~ SOLN
20.0000 [IU] | Freq: Every day | SUBCUTANEOUS | Status: DC
  Administered 2018-05-24 – 2018-06-02 (×10): 20 [IU] via SUBCUTANEOUS
  Filled 2018-05-23 (×10): qty 0.2

## 2018-05-23 MED ORDER — INSULIN DETEMIR 100 UNIT/ML FLEXPEN: 20.0000 [IU] | PEN_INJECTOR | Freq: Every day | SUBCUTANEOUS | 11 refills | Status: DC

## 2018-05-23 MED ORDER — FUROSEMIDE 80 MG PO TABS: 80.0000 mg | ORAL_TABLET | Freq: Two times a day (BID) | ORAL | 0 refills | Status: DC

## 2018-05-23 MED ORDER — INSULIN DETEMIR 100 UNIT/ML ~~LOC~~ SOLN
15.0000 [IU] | Freq: Two times a day (BID) | SUBCUTANEOUS | Status: DC
  Administered 2018-05-23: 15 [IU] via SUBCUTANEOUS
  Filled 2018-05-23: qty 0.15

## 2018-05-23 MED ORDER — NYSTATIN 100000 UNIT/GM EX POWD: Freq: Two times a day (BID) | CUTANEOUS | 0 refills | Status: DC

## 2018-05-23 NOTE — Progress Notes (Signed)
0000 CBG 83 per Nurse Tech, has not transferred over to Epic yet

## 2018-05-23 NOTE — Progress Notes (Signed)
Teaching service updated for 14 beats of SVT. Patient asymptomatic. Will continue to monitor.

## 2018-05-23 NOTE — Progress Notes (Addendum)
Family Medicine Teaching Service Daily Progress Note Intern Pager: [phone]  Patient name: [name] Medical record number: [id number] Date of birth: 1941/10/31 Age: 77 y.o. Gender: male  Primary Care Provider: Tonette Bihari, MD Consultants: none Code Status: FULL  Pt Overview and Major Events to Date:  5/20: patient admitted to teaching service 5/23: Echo notable for G2DD, EF 60-65%  Assessment and Plan: [name] is a 77yo M presenting with 3 day history of progressively worsening shortness of breath.  PMH significant for T2DM, BLE lymphedema, elephantiasis nostra verrucosa of RLE.   Hypoxia: Likely secondary to CHF exacerbation (newly diagnosed). Echo: grade 2 diastolic dysfunction, LVED 60-65%.  This could be early stages of HFpEF, and/or pulmonary hypertension although unable to diagnose with the measurements from his echo. Put out 0.7L yesterday. Total diuresis of about 11L with slowing of diuresis so may be approaching dry weight. Suspect contribution of OSA and OHSgiven morbid obesity, as well.  - Transitioned to PO Lasix 80 mg BID on 5/24; will continue this today - CSW involved, appreciate assistance - Daily Weights - Strict I/O's - bmp in a.m. - will need supplemental O2 upon discharge  Lives alone with limited help: Patient agreeable to CIR after discussing with family. - CSW consulted, appreciate recs - Placed inpatient rehab consult 5/25 and coordinator said patient may be evaluated over the weekend but otherwise will be seen 5/27 [facility] consultation - OT recommends home health OT  Chest Pain, resolved: Patient had episode of chest pain with negative troponin x2. Has not recurred.  - aspirin 81mg  qhs  Elephantiasis nostra verrucosa  Bilateral LE Lymphedema: Has not seen wound clinic for the past three months.  Not on ABX.  - Diuresing as above - per wound care - Ammonium lactate 12% lotion  Anemia of Chronic Disease: Stable. Unknown etiology, could  be secondary to possible CHF.  T2DM: had CBG to 48 yesterday morning (5/24). CBGs in 70s-low 100s on levemir 24U BID (decreased from home 48U BID). Last Ha1c per chart review in March 2018, was 14.  Ha1c is 9.3 on admission.  - resistant SSI - Held last night's levemir  - Will decrease levemir to 20 U daily  Pressure ulcer of upper posterior thigh, right, stage III - Wound care per Wound Care service  Monilla Intertrigo of abdominal pannus and intergluteal cleft  - Nystatin powder bid  Prostatic Adenocarcinoma: Per patient, urologist is [facility], per their office his last visit was Dec 2017.  CT abdomen/pelvis negative for any obvious pathology concerning for metastatic cancer, however prostate was not entirely visualized.  This appears to not be concerning for acute process, recommend further outpatient work-up. PSA elevated at 12.94 on 5/21.  - Outpatient follow-up warranted - continue flomax  Right Cheek Mass: See prior notes.  Appears to be stable at this time, will recommend further outpatient workup.   Infestation by bed bug Appears to be resolved. Patient reports bed bugs "should be gone by now," although unclear if he actually had professional help come in. [facility] will investigate, appreciate assistance.   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 He is disappointed his lower legs are still draining serous fluid.   Objective: Temp:  [97.8 F (36.6 C)-99.8 F (37.7 C)]  97.8 F (36.6 C) (05/25 0447) Pulse Rate:  [70-83] 71 (05/25 0447) Resp:  [20-24] 22 (05/25 0447) BP: (107-130)/(50-67) 121/52 (05/25 0447) SpO2:  [90 %-96 %] 93 % (05/25 0447) Physical Exam: General:  morbidly obese male, eating breakfast in bed, [place] in place HEENT: firm, mobile, subcutaneous mass that is nontender to palpation anterior to parotid on R cheek Cardiovascular: RRR, nl L4,T6, 2/6 holosystolic murmur heard best over the RUSB Respiratory: decreased breath sounds, exam limited by body habitus. Normal WoB.  Abdomen: BS+, nontender to palpation Extremities: Bilateral lower legs wrapped by wound care. Woody edema of toes.  Laboratory: Recent Labs  Lab 05/20/18 0426 05/21/18 0214 05/22/18 0532  WBC 9.4 7.9 6.7  HGB 9.1* 9.6* 10.0*  HCT 30.7* 32.8* 32.9*  PLT 265 311 299   Recent Labs  Lab 05/19/18 0848 05/20/18 0426 05/21/18 0214 05/22/18 0532  NA  --  143 142 139  K  --  3.8 3.6 4.2  CL  --  101 96* 91*  CO2  --  31 36* 40*  BUN  --  15 12 13   CREATININE  --  0.91 0.98 1.07  CALCIUM  --  8.4* 8.5* 8.4*  PROT 8.5*  --   --   --   BILITOT 0.5  --   --   --   ALKPHOS 120  --   --   --   ALT 12*  --   --   --   AST 17  --   --   --   GLUCOSE  --  87 108* 83   - Left ventricle: The cavity size was normal. There was mild   concentric hypertrophy. Systolic function was normal. The   estimated ejection fraction was in the range of 60% to 65%. Wall   motion was normal; there were no regional wall motion   abnormalities. Features are consistent with a pseudonormal left   ventricular filling pattern, with concomitant abnormal relaxation   and increased filling pressure (grade 2 diastolic dysfunction).   Doppler parameters are consistent with high ventricular filling   pressure. - Aortic valve: Valve mobility was restricted. - Mitral valve: Calcified annulus. Mild focal calcification of the   anterior leaflet. - Left atrium: The atrium was mildly dilated. - Right ventricle: The cavity size was mildly dilated. Wall   thickness was normal. Systolic function was mildly reduced. - Tricuspid valve: There was trivial regurgitation. - Pulmonary arteries: Systolic pressure  could not be accurately   estimated.  Rogue Bussing, MD 05/23/2018, 5:38 AM PGY-3, [place] Intern pager: [phone], text pages welcome

## 2018-05-24 LAB — BASIC METABOLIC PANEL
Anion gap: 11 (ref 5–15)
BUN: 14 mg/dL (ref 6–20)
CHLORIDE: 87 mmol/L — AB (ref 101–111)
CO2: 38 mmol/L — AB (ref 22–32)
CREATININE: 1.12 mg/dL (ref 0.61–1.24)
Calcium: 8.7 mg/dL — ABNORMAL LOW (ref 8.9–10.3)
GFR calc Af Amer: >60 mL/min (ref >60)
GFR calc non Af Amer: >60 mL/min (ref >60)
GLUCOSE: 137 mg/dL — AB (ref 65–99)
Potassium: 4 mmol/L (ref 3.5–5.1)
Sodium: 136 mmol/L (ref 135–145)

## 2018-05-24 LAB — GLUCOSE, CAPILLARY
GLUCOSE-CAPILLARY: 127 mg/dL — AB (ref 65–99)
GLUCOSE-CAPILLARY: 115 mg/dL — AB (ref 65–99)
Glucose-Capillary: 129 mg/dL — ABNORMAL HIGH (ref 65–99)
Glucose-Capillary: 141 mg/dL — ABNORMAL HIGH (ref 65–99)
Glucose-Capillary: 149 mg/dL — ABNORMAL HIGH (ref 65–99)
Glucose-Capillary: 163 mg/dL — ABNORMAL HIGH (ref 65–99)

## 2018-05-24 LAB — MAGNESIUM: Magnesium: 1.7 mg/dL (ref 1.7–2.4)

## 2018-05-24 MED ORDER — POLYETHYLENE GLYCOL 3350 17 G PO PACK
17.0000 g | PACK | Freq: Every day | ORAL | Status: DC
  Administered 2018-05-24 – 2018-06-02 (×8): 17 g via ORAL
  Filled 2018-05-24 (×8): qty 1

## 2018-05-24 NOTE — Progress Notes (Signed)
Family Medicine Teaching Service Daily Progress Note Intern Pager: [phone]  Patient name: [name] Medical record number: [id number] Date of birth: April 26, 1941 Age: 77 y.o. Gender: male  Primary Care Provider: Tonette Bihari, MD Consultants: none Code Status: FULL  Pt Overview and Major Events to Date:  5/20: patient admitted to teaching service 5/23: Echo notable for G2DD, EF 60-65%  Assessment and Plan: Mickey Esguerra is a 77yo M presenting with 3 day history of progressively worsening shortness of breath.  PMH significant for T2DM, BLE lymphedema, elephantiasis nostra verrucosa of RLE.   Hypoxia: Improved. Likely secondary to CHF exacerbation (newly diagnosed). Echo: grade 2 diastolic dysfunction, LVED 60-65%. Suspect contribution of OSA and OHSgiven morbid obesity, as well. Net 5.9 L negative. - Brisk diuresis on PO Lasix 80 mg BID, continue - Daily Weights - Strict I/O's - monitor BMP - will need supplemental O2 upon discharge - remove Foley, place condom cath  Lives alone with limited help: Patient agreeable to CIR after discussing with family. - CSW consulted, appreciate recs - Placed inpatient rehab consult 5/25 and coordinator said patient may be evaluated over the weekend but otherwise will be seen 5/27 [facility] consultation - OT recommends home health OT  Chest Pain, resolved: Patient had episode of chest pain with negative troponin x2. Has not recurred.  - aspirin 81mg  qhs  Elephantiasis nostra verrucosa  Bilateral LE Lymphedema: Has not seen wound clinic for the past three months.  Not on ABX.  - Diuresing as above - per wound care - continue to apply Ammonium lactate 12% lotion  Anemia of Chronic Disease: Stable. Unknown etiology, could be secondary to possible CHF.  T2DM: Ha1c is 9.3 on admission.  CBG this AM 141 - sensitive SSI - continue decreased dose of levemir 20 U daily  Pressure ulcer of upper posterior thigh, right, stage III - Wound care  per Wound Care service  Monilla Intertrigo of abdominal pannus and intergluteal cleft  - Nystatin powder bid  Prostatic Adenocarcinoma: Per patient, urologist is [facility], per their office his last visit was Dec 2017.  CT abdomen/pelvis negative for any obvious pathology concerning for metastatic cancer, however prostate was not entirely visualized.  This appears to not be concerning for acute process, recommend further outpatient work-up. PSA elevated at 12.94 on 5/21.  - Outpatient follow-up warranted - continue flomax  Right Cheek Mass: See prior notes.  Appears to be stable at this time, will recommend further outpatient workup.   Infestation by bed bug Appears to be resolved. Patient reports bed bugs "should be gone by now," although unclear if he actually had professional help come in. [facility] will investigate, appreciate assistance.   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 Breathing is good. No other complaints.   Objective: Temp:  [98 F (36.7 C)-99.6 F (37.6 C)] 98 F (36.7 C) (05/26 0725) Pulse Rate:  [66-78] 71 (05/26 0725) Resp:  [15-17] 15 (05/26 0725) BP: (101-135)/(51-75) 123/66 (05/26 0725) SpO2:  [91 %-99 %] 99 % (05/26 0725) Weight:  [320 lb 12.8 oz (145.5 kg)] 320 lb 12.8 oz (145.5 kg) (05/26 0452) Physical Exam:  Gen: obese elderly gentleman sitting up in bed in NAD, [place] in place Heart: 3/6 systolic murmur Lungs: CTA  bilaterally, no increased work of breathing Abdomen: soft, non-tender, non-distended, +BS Extremities: legs wrapped bilaterally with +2 pitting edema and chronic skin changes Neuro: no focal deficits   Laboratory: Recent Labs  Lab  05/20/18 0426 05/21/18 0214 05/22/18 0532  WBC 9.4 7.9 6.7  HGB 9.1* 9.6* 10.0*  HCT 30.7* 32.8* 32.9*  PLT 265 311 299   Recent Labs  Lab 05/19/18 0848  05/21/18 0214 05/22/18 0532 05/24/18 0037  NA  --    < > 142 139 136  K  --    < > 3.6 4.2 4.0  CL  --    < > 96* 91* 87*  CO2  --    < > 36* 40* 38*  BUN  --    < > 12 13 14   CREATININE  --    < > 0.98 1.07 1.12  CALCIUM  --    < > 8.5* 8.4* 8.7*  PROT 8.5*  --   --   --   --   BILITOT 0.5  --   --   --   --   ALKPHOS 120  --   --   --   --   ALT 12*  --   --   --   --   AST 17  --   --   --   --   GLUCOSE  --    < > 108* 83 137*   < > = values in this interval not displayed.   ECHO: - Left ventricle: The cavity size was normal. There was mild   concentric hypertrophy. Systolic function was normal. The   estimated ejection fraction was in the range of 60% to 65%. Wall   motion was normal; there were no regional wall motion   abnormalities. Features are consistent with a pseudonormal left   ventricular filling pattern, with concomitant abnormal relaxation   and increased filling pressure (grade 2 diastolic dysfunction).   Doppler parameters are consistent with high ventricular filling   pressure. - Aortic valve: Valve mobility was restricted. - Mitral valve: Calcified annulus. Mild focal calcification of the   anterior leaflet. - Left atrium: The atrium was mildly dilated. - Right ventricle: The cavity size was mildly dilated. Wall   thickness was normal. Systolic function was mildly reduced. - Tricuspid valve: There was trivial regurgitation. - Pulmonary arteries: Systolic pressure could not be accurately   estimated.  Steve Rattler, DO 05/24/2018, 8:30 AM PGY-2, [place] Intern pager: [phone], text pages welcome

## 2018-05-24 NOTE — Progress Notes (Signed)
Family Medicine on call notified of 8 beats SVT. Mag level ordered.

## 2018-05-24 NOTE — Plan of Care (Signed)
  Problem: Education: Goal: Knowledge of General Education information will improve Outcome: Progressing   

## 2018-05-25 DIAGNOSIS — I872 Venous insufficiency (chronic) (peripheral): Secondary | ICD-10-CM

## 2018-05-25 DIAGNOSIS — Z602 Problems related to living alone: Secondary | ICD-10-CM

## 2018-05-25 DIAGNOSIS — I890 Lymphedema, not elsewhere classified: Secondary | ICD-10-CM

## 2018-05-25 DIAGNOSIS — E8779 Other fluid overload: Secondary | ICD-10-CM

## 2018-05-25 DIAGNOSIS — R0689 Other abnormalities of breathing: Secondary | ICD-10-CM

## 2018-05-25 DIAGNOSIS — R0600 Dyspnea, unspecified: Secondary | ICD-10-CM

## 2018-05-25 LAB — GLUCOSE, CAPILLARY
GLUCOSE-CAPILLARY: 124 mg/dL — AB (ref 65–99)
GLUCOSE-CAPILLARY: 189 mg/dL — AB (ref 65–99)
Glucose-Capillary: 123 mg/dL — ABNORMAL HIGH (ref 65–99)
Glucose-Capillary: 141 mg/dL — ABNORMAL HIGH (ref 65–99)
Glucose-Capillary: 144 mg/dL — ABNORMAL HIGH (ref 65–99)
Glucose-Capillary: 148 mg/dL — ABNORMAL HIGH (ref 65–99)

## 2018-05-25 NOTE — Care Management Note (Addendum)
Case Management Note  Patient Details  Name: [name] MRN: [id number] Date of Birth: March 29, 1941  Subjective/Objective:                    Action/Plan: CIR is recommending SNF. CM met with the patient and he currently is refusing SNF. CM called and spoke to the MD and he doesn't feel patient d/cing home is safe. MD to call and speak to patients daughter.  CM did inquire with the patient about [facility] agency and he would like to use [facility]. Dan with [facility] notified and accepted the referral.  Pt also needing home oxygen. Pt chose to use [facility] for this also. Dan made aware. Pt will need new qualifying oxygen sat. Charge RN made aware.  Pt asking to PTAR home when d/ced.   Addendum: (1610) spoke to patients daughter to see if she could help convince patient to attend SNF rehab prior to d/c home. She is to speak with him tonight and inform MD's in am. CM following.  Ivin Booty:: [phone] work                [phone] cell   Expected Discharge Date:                  Expected Discharge Plan:  [place]  In-House Referral:  [facility]  Discharge planning Services  CM Consult  Post Acute Care Choice:  Home Health, Durable Medical Equipment Choice offered to:  Patient  DME Arranged:  Oxygen DME Agency:  [place] Arranged:  RN, Disease Management, PT, OT, Refused SNF [place] Agency:  [place]  Status of Service:  In process, will continue to follow  If discussed at Long Length of Stay Meetings, dates discussed:    Additional Comments:  Pollie Friar, RN 05/25/2018, 2:06 PM

## 2018-05-25 NOTE — Progress Notes (Signed)
Physical Therapy Treatment Patient Details Name: [name] MRN: [id number] DOB: 1941-05-23 Today's Date: 05/25/2018    History of Present Illness [name] is a 77yo M presenting with 3 day history of progressively worsening shortness of breath.  PMH significant for T2DM, BLE lymphedema, elephantiasis nostra verrucosa of RLE.     PT Comments    Pt tolerated almost 1.5 hours up in recliner. Continue to recommend [facility]-SNF for post acute rehab. However pt refusing this so would maximize [facility] services.    Follow Up Recommendations  SNF(CIR did not accept)     Equipment Recommendations  Rolling walker with 5" wheels(bariatric)    Recommendations for Other Services       Precautions / Restrictions Precautions Precautions: Fall    Mobility  Bed Mobility Overal bed mobility: Needs Assistance Bed Mobility: Sit to Supine     Supine to sit: Supervision;HOB elevated Sit to supine: Mod assist   General bed mobility comments: Assist to bring LE's up into the bed  Transfers Overall transfer level: Needs assistance Equipment used: Rolling walker (2 wheeled) Transfers: Sit to/from [facility] Sit to Stand: +2 physical assistance;Mod assist Stand pivot transfers: +2 physical assistance;Min assist       General transfer comment: Assist to bring hips up from chair. Used bed pad under hips to bring hips up. Incr time to rise and move LUE from pushing up on armrest of chair to placing on walker. Slow pivotal steps from recliner to bed.   Ambulation/Gait                 Stairs             Wheelchair Mobility    Modified Rankin (Stroke Patients Only)       Balance Overall balance assessment: Needs assistance Sitting-balance support: Bilateral upper extremity supported;Feet supported Sitting balance-Leahy Scale: Fair     Standing balance support: Bilateral upper extremity supported Standing balance-Leahy Scale: Poor Standing balance  comment: walker and min assist for static standing                            Cognition Arousal/Alertness: Awake/alert Behavior During Therapy: Flat affect Overall Cognitive Status: Within Functional Limits for tasks assessed                                 General Comments: pt requires extensive period of time to consider therapist's requests      Exercises      General Comments        Pertinent Vitals/Pain Pain Assessment: No/denies pain    Home Living                      Prior Function            PT Goals (current goals can now be found in the care plan section) Acute Rehab PT Goals Patient Stated Goal: to return home Progress towards PT goals: Progressing toward goals    Frequency    Min 3X/week      PT Plan Discharge plan needs to be updated    Co-evaluation PT/OT/SLP Co-Evaluation/Treatment: Yes Reason for Co-Treatment: For patient/therapist safety PT goals addressed during session: Mobility/safety with mobility OT goals addressed during session: Strengthening/ROM      AM-PAC PT "6 Clicks" Daily Activity  Outcome Measure  Difficulty turning over in bed (including  adjusting bedclothes, sheets and blankets)?: A Little Difficulty moving from lying on back to sitting on the side of the bed? : A Lot Difficulty sitting down on and standing up from a chair with arms (e.g., wheelchair, bedside commode, etc,.)?: Unable Help needed moving to and from a bed to chair (including a wheelchair)?: A Lot Help needed walking in hospital room?: Total Help needed climbing 3-5 steps with a railing? : Total 6 Click Score: 10    End of Session Equipment Utilized During Treatment: Gait belt;Oxygen Activity Tolerance: Patient tolerated treatment well Patient left: with call bell/phone within reach;in bed;with nursing/sitter in room Nurse Communication: Mobility status PT Visit Diagnosis: Muscle weakness (generalized)  (M62.81);Difficulty in walking, not elsewhere classified (R26.2)     Time: 1212-1224 PT Time Calculation (min) (ACUTE ONLY): 12 min  Charges:  $Therapeutic Activity: 8-22 mins                    G Codes:       [facility] PT [place] 05/25/2018, 2:38 PM

## 2018-05-25 NOTE — Progress Notes (Signed)
Physical Therapy Treatment Patient Details Name: [name] MRN: [id number] DOB: 24-Jul-1941 Today's Date: 05/25/2018    History of Present Illness Dyllin Gulley is a 77yo M presenting with 3 day history of progressively worsening shortness of breath.  PMH significant for T2DM, BLE lymphedema, elephantiasis nostra verrucosa of RLE.     PT Comments    Pt making steady progress. Still requires assist for mobility including bed to chair transfer. CIR denied. Updated recommendation to SNF since pt lives alone and doesn't have assist needed for current level of function.   Follow Up Recommendations  SNF(CIR did not accept)     Equipment Recommendations  Rolling walker with 5" wheels(bariatric)    Recommendations for Other Services       Precautions / Restrictions Precautions Precautions: Fall    Mobility  Bed Mobility Overal bed mobility: Needs Assistance Bed Mobility: Supine to Sit     Supine to sit: Supervision;HOB elevated     General bed mobility comments: increased time, use of rail, HOB up  Transfers Overall transfer level: Needs assistance Equipment used: Rolling walker (2 wheeled) Transfers: Sit to/from [facility] Sit to Stand: From elevated surface;+2 physical assistance;Min assist Stand pivot transfers: +2 physical assistance;Min assist;From elevated surface       General transfer comment: Initially pt stood from EOB with Denna Haggard with Lt foot up on platform and rt food on ground per pt's insistance. Then attempted to stand with crutches but pt unable. Able to stand with bariatric RW and perform stand pivot from bed to recliner. Seat of recliner built up with multiple blankets in seat of chair.   Ambulation/Gait                 Stairs             Wheelchair Mobility    Modified Rankin (Stroke Patients Only)       Balance Overall balance assessment: Needs assistance Sitting-balance support: Bilateral upper extremity  supported;Feet supported Sitting balance-Leahy Scale: Fair     Standing balance support: Bilateral upper extremity supported Standing balance-Leahy Scale: Poor Standing balance comment: walker and min assist for static standing                            Cognition Arousal/Alertness: Awake/alert Behavior During Therapy: Flat affect Overall Cognitive Status: Within Functional Limits for tasks assessed                                 General Comments: pt requires extensive period of time to consider therapist's requests      Exercises      General Comments        Pertinent Vitals/Pain Pain Assessment: No/denies pain    Home Living                      Prior Function            PT Goals (current goals can now be found in the care plan section) Acute Rehab PT Goals Patient Stated Goal: to return home Progress towards PT goals: Progressing toward goals    Frequency    Min 3X/week      PT Plan Discharge plan needs to be updated    Co-evaluation PT/OT/SLP Co-Evaluation/Treatment: Yes Reason for Co-Treatment: For patient/therapist safety PT goals addressed during session: Mobility/safety with mobility OT goals addressed  during session: Strengthening/ROM      AM-PAC PT "6 Clicks" Daily Activity  Outcome Measure  Difficulty turning over in bed (including adjusting bedclothes, sheets and blankets)?: A Little Difficulty moving from lying on back to sitting on the side of the bed? : A Lot Difficulty sitting down on and standing up from a chair with arms (e.g., wheelchair, bedside commode, etc,.)?: Unable Help needed moving to and from a bed to chair (including a wheelchair)?: A Lot Help needed walking in hospital room?: Total Help needed climbing 3-5 steps with a railing? : Total 6 Click Score: 10    End of Session Equipment Utilized During Treatment: Gait belt;Oxygen Activity Tolerance: Patient tolerated treatment  well Patient left: in chair;with call bell/phone within reach Nurse Communication: Mobility status PT Visit Diagnosis: Muscle weakness (generalized) (M62.81);Difficulty in walking, not elsewhere classified (R26.2)     Time: 1015-1100 PT Time Calculation (min) (ACUTE ONLY): 45 min  Charges:  $Therapeutic Activity: 23-37 mins                    G Codes:       [facility] PT [place] 05/25/2018, 2:33 PM

## 2018-05-25 NOTE — Consult Note (Addendum)
Physical Medicine and Rehabilitation Consult Reason for Consult: Decreased functional mobility Referring Physician: Family medicine   HPI: [name] is a 77 y.o. right-handed male with history of morbid obesity with BMI 42.91, diabetes mellitus, hypertension, bilateral total hip replacement, prostate cancer and bilateral lower leg lymphedema of which she has been followed at the wound clinic in the past.  Per chart review patient lives alone.  One level home with level entry.  He transfers independently to a motorized wheelchair and perform sponge bathing and dresses independently using a reacher.  He does use crutches for some simple transfers and short step ambulation.  He has a Psychiatrist who works and very limited family.  Friends assist him with grocery shopping and he he also uses [employer] for transportation needs.  Presented 05/19/2018 with increasing shortness of breath.  Chest x-ray showed cardiomegaly with minimal central congestion.  Troponin 0 0.04.  CT angiogram of the chest no evidence of pulmonary emboli.  CT abdomen pelvis mild chronic atelectasis versus scarring in the dependent lower lungs bilaterally.  Echocardiogram with ejection fraction of 24% grade 2 diastolic dysfunction.  Patient placed on intravenous Lasix for diuresis.  Wound care nurse follow-up for lymphedema lower extremities.  Subcutaneous Lovenox for DVT prophylaxis.  Documentation of possible infestation of bedbugs placed on contact precautions.  Therapy evaluations completed with recommendations of physical medicine rehab consult.  Has been requiring 2 person assistance for transfers.  Review of Systems  Constitutional: Negative for chills and fever.  HENT: Negative for hearing loss.   Eyes: Negative for blurred vision and double vision.  Respiratory: Positive for shortness of breath.   Cardiovascular: Positive for chest pain and leg swelling. Negative for palpitations.  Gastrointestinal: Positive for  constipation. Negative for nausea and vomiting.       GERD  Genitourinary: Positive for urgency. Negative for hematuria.  Musculoskeletal: Positive for back pain, joint pain and myalgias.  Skin: Negative for rash.  All other systems reviewed and are negative.  Past Medical History:  Diagnosis Date  . COLON POLYP 02/26/2007   Qualifier: Diagnosis of  By: Erling Cruz  MD, MELISSA    . Complication of anesthesia   . Diabetes mellitus   . GERD (gastroesophageal reflux disease)   . Heart murmur    last 2D Echo -03/31/08  . HIATAL HERNIA WITH REFLUX 09/29/2006   Qualifier: Diagnosis of  By: Erling Cruz  MD, MELISSA    . HIP REPLACEMENT, BILATERAL, HX OF 05/12/2007   Qualifier: Diagnosis of  By: Erling Cruz  MD, MELISSA    . History of blood transfusion   . Hypertension   . OSTEOARTHRITIS 03/05/2007   Qualifier: Diagnosis of  By: Erling Cruz  MD, MELISSA    . PONV (postoperative nausea and vomiting)   . Prostate cancer ([place])   . SBO (small bowel obstruction) ([place]) 02/2017  . Seasonal allergies   . VENOUS INSUFFICIENCY, CHRONIC 02/26/2007   Qualifier: Diagnosis of  By: Erling Cruz  MD, MELISSA     Past Surgical History:  Procedure Laterality Date  . COLONOSCOPY    . COLONOSCOPY    . HIP ARTHROPLASTY     bil  . I&D EXTREMITY  08/14/2012   Procedure: IRRIGATION AND DEBRIDEMENT EXTREMITY;  Surgeon: Newt Minion, MD;  Location: [place];  Service: Orthopedics;  Laterality: Right;  Irrigation and Debridement Right tibia, Placement antibiotic beads  . I&D EXTREMITY Left 03/18/2014   Procedure: IRRIGATION AND DEBRIDEMENT EXTREMITY;  Surgeon: Newt Minion, MD;  Location: [place];  Service: Orthopedics;  Laterality: Left;  Debridement Left Calf Ulcer, Apply Theraskin and Wound VAC  . IRRIGATION AND DEBRIDEMENT ABSCESS Left 03/18/2014   DR DUDA  . LEG SURGERY     ROD for fracture  . LEG WOUND REPAIR / CLOSURE     Beads .  Skin wound  . SKIN GRAFT     right leg- from donor skin  . SKIN GRAFT  1976   R wrist   skin graft from left  thigh  . UPPER GASTROINTESTINAL ENDOSCOPY    . WISDOM TOOTH EXTRACTION    . WRIST FUSION     right wrist   Family History  Problem Relation Age of Onset  . Hypertension Mother    Social History:  reports that he has never smoked. He has never used smokeless tobacco. He reports that he does not drink alcohol or use drugs. Allergies:  Allergies  Allergen Reactions  . Ace Inhibitors Cough    With Lisinopril.  . Angiotensin Receptor Blockers Other (See Comments)    Caused excessive weight gain  . Metformin Diarrhea   Medications Prior to Admission  Medication Sig Dispense Refill  . aspirin 81 MG chewable tablet Chew 81 mg by mouth every other day.     . esomeprazole (NEXIUM) 40 MG capsule TAKE 1 CAPSULE BY MOUTH EVERY DAY 60 capsule 0  . FLOVENT HFA 220 MCG/ACT inhaler INHALE 1 PUFF INTO THE LUNGS TWICE DAILY 36 g 1  . fluticasone (CUTIVATE) 0.05 % cream Apply 1 application topically 2 (two) times daily. Reported on 05/23/2016    . furosemide (LASIX) 40 MG tablet TAKE 1 TABLET BY MOUTH TWICE DAILY. 60 tablet 2  . Insulin Detemir (LEVEMIR FLEXTOUCH) 100 UNIT/ML Pen Inject 48 Units into the skin 2 (two) times daily. 45 mL 10  . latanoprost (XALATAN) 0.005 % ophthalmic solution INSTILL 1 DROP IN BOTH EYES EVERY EVENING FOR GLAUCOMA 2.5 mL 1  . latanoprost (XALATAN) 0.005 % ophthalmic solution INSTILL 1 DROP IN BOTH EYES EVERY EVENING FOR GLAUCOMA 2.5 mL 2  . LEVEMIR FLEXTOUCH 100 UNIT/ML Pen INJECT 50 UNITS SUBCUTANEOUSLY TWICE DAILY 45 pen 5  . metoprolol tartrate (LOPRESSOR) 100 MG tablet TAKE 1/2 TABLET BY MOUTH 2 TIMES DAILY. 60 tablet 2  . mometasone (NASONEX) 50 MCG/ACT nasal spray INSTILL 2 SPRAYS INTO THE NOSE DAILY 17 g 2  . mupirocin ointment (BACTROBAN) 2 % Apply 1 application topically 2 (two) times daily. To legs    . NOVOLOG FLEXPEN 100 UNIT/ML FlexPen INJECT 45 UNITS SUBCUTANEOUSLY TWICE DAILY 51 pen 0  . pravastatin (PRAVACHOL) 40 MG tablet TAKE 1 TABLET BY MOUTH DAILY 90  tablet 3  . PROAIR HFA 108 (90 Base) MCG/ACT inhaler INHALE 2 PUFFS BY MOUTH EVERY 4 HOURS AS NEEDED FOR WHEEZING OR SHORTNESS OF BREATH 8.5 each 2  . silver sulfADIAZINE (SILVADENE) 1 % cream APPLY AS DIRECTED TO WOUND EVERY DAY. 50 g 0  . tamsulosin (FLOMAX) 0.4 MG CAPS capsule TAKE 2 CAPSULES BY MOUTH EVERY MORNING 60 capsule 0  . triamterene-hydrochlorothiazide (MAXZIDE-25) 37.5-25 MG tablet TAKE 1 TABLET BY MOUTH EVERY DAY FOR BLOOD PRESSURE. 30 tablet 2  . EASY TOUCH PEN NEEDLES 31G X 8 MM MISC USE WITH FLEXPEN AS DIRECTED 100 each 7  . Lancet Devices (ONE TOUCH DELICA LANCING DEV) MISC 75 Units by Does not apply route 2 (two) times daily. 1 each 0  . ONE TOUCH ULTRA TEST test strip USE TO CHECK BLOOD  SUGAR FOUR TIMES A DAY 100 each 11  . ONE TOUCH ULTRA TEST test strip USE TO TEST BLOOD SUGAR TWICE DAILY 100 each 10  . ONETOUCH DELICA LANCETS 73X MISC USE TO CHECK BLOOD SUGAR TWICE DAILY. 200 each 9    Home: Home Living Family/patient expects to be discharged to:: Private residence Living Arrangements: Alone Available Help at Discharge: Friend(s), Available PRN/intermittently, Family Type of Home: Apartment Home Access: Elevator Home Layout: One level Bathroom Shower/Tub: Other (comment)(sponge bath) Bathroom Toilet: Handicapped height Home Equipment: Wheelchair - power, [place] [facility] bed Adaptive Equipment: Reacher Additional Comments: reports the leg rests of his w/c are in disrepair  Functional History: Prior Function Level of Independence: Independent with assistive device(s) Comments: transfers independently to motorized w/c, sponge bathes and dresses independently using reacher, friends get groceries, uses [employer] to get to grocery store Functional Status:  Mobility: Bed Mobility Overal bed mobility: Needs Assistance Bed Mobility: Supine to Sit, Sit to Supine Rolling: Supervision Sidelying to sit: Supervision Supine to sit: Min assist Sit to  supine: Mod assist General bed mobility comments: assist to raise trunk, increased time, assist for B LEs back into bed Transfers Overall transfer level: Needs assistance Equipment used: 1 person hand held assist, Crutches Transfer via Lift Equipment: Stedy Transfers: Sit to/from Stand Sit to Stand: From elevated surface General transfer comment: attempted stand multiple times from elevated bed with use of sara stedy, pt able to clear his buttocks, but not stand      ADL: ADL Overall ADL's : Needs assistance/impaired Eating/Feeding: Independent, Bed level Grooming: Wash/dry hands, Wash/dry face, Sitting Upper Body Bathing: Minimal assistance, Sitting Lower Body Bathing: Bed level, Total assistance Upper Body Dressing : Minimal assistance, Sitting Lower Body Dressing: Total assistance, Bed level General ADL Comments: Limited evaluation with lack of extra wide walker or crutches.  Cognition: Cognition Overall Cognitive Status: Within Functional Limits for tasks assessed Orientation Level: Oriented X4 Cognition Arousal/Alertness: Awake/alert Behavior During Therapy: Flat affect Overall Cognitive Status: Within Functional Limits for tasks assessed Area of Impairment: Safety/judgement Safety/Judgement: Decreased awareness of safety, Decreased awareness of deficits  Blood pressure 116/63, pulse 76, temperature 98.3 F (36.8 C), temperature source Oral, resp. rate (!) 22, height 6' (1.829 m), weight (!) 143.5 kg (316 lb 6.4 oz), SpO2 92 %. Physical Exam  Vitals reviewed. HENT:  Head: Normocephalic.  Eyes:  Pupils sluggish but reactive to light  Neck: Normal range of motion. Neck supple. No thyromegaly present.  Cardiovascular: Normal rate and regular rhythm.  Respiratory:  Fair inspiratory effort  GI: Soft. Bowel sounds are normal. He exhibits no distension.  Neurological: He is alert.  Mood is flat but appropriate.  Follows simple commands.  Skin:  Bilateral lower  extremities are wrapped  Neuro:  Eyes without evidence of nystagmus  Tone is normal without evidence of spasticity Cerebellar exam shows no evidence of ataxia on finger nose finger or heel to shin testing No evidence of trunkal ataxia  Motor strength is 5/5 in bilateral deltoid, biceps, triceps, finger flexors and extensors, wrist flexors and extensors, 3- hip flexors,3- knee  extensors, ankle dorsiflexors,Trace toe flexors and extensors  Sensory exam is normal tolight touch in the upper and lower limbs       Results for orders placed or performed during the hospital encounter of 05/19/18 (from the past 24 hour(s))  Glucose, capillary     Status: Abnormal   Collection Time: 05/24/18  7:23 AM  Result Value Ref Range   Glucose-Capillary  141 (H) 65 - 99 mg/dL  Glucose, capillary     Status: Abnormal   Collection Time: 05/24/18 11:33 AM  Result Value Ref Range   Glucose-Capillary 163 (H) 65 - 99 mg/dL  Glucose, capillary     Status: Abnormal   Collection Time: 05/24/18  4:22 PM  Result Value Ref Range   Glucose-Capillary 127 (H) 65 - 99 mg/dL  Glucose, capillary     Status: Abnormal   Collection Time: 05/24/18  8:42 PM  Result Value Ref Range   Glucose-Capillary 115 (H) 65 - 99 mg/dL  Glucose, capillary     Status: Abnormal   Collection Time: 05/24/18 11:57 PM  Result Value Ref Range   Glucose-Capillary 129 (H) 65 - 99 mg/dL  Glucose, capillary     Status: Abnormal   Collection Time: 05/25/18  3:57 AM  Result Value Ref Range   Glucose-Capillary 124 (H) 65 - 99 mg/dL   No results found.   Assessment/Plan: Diagnosis: Deconditioning after exacerbation of diastolic CHF 1. Does the need for close, 24 hr/day medical supervision in concert with the patient's rehab needs make it unreasonable for this patient to be served in a less intensive setting? No 2. Co-Morbidities requiring supervision/potential complications: Chronic severe lymphedema bilateral lower extremities, type 2  diabetes 3. Due to bladder management, bowel management, safety, skin/wound care, disease management, medication administration, pain management and patient education, does the patient require 24 hr/day rehab nursing? Potentially 4. Does the patient require coordinated care of a physician, rehab nurse, PT, OT to address physical and functional deficits in the context of the above medical diagnosis(es)? Potentially Addressing deficits in the following areas: balance, endurance, locomotion, strength, transferring, bowel/bladder control, bathing, dressing, feeding, grooming, toileting and psychosocial support 5. Can the patient actively participate in an intensive therapy program of at least 3 hrs of therapy per day at least 5 days per week? Potentially 6. The potential for patient to make measurable gains while on inpatient rehab is fair 7. Anticipated functional outcomes upon discharge from inpatient rehab are min assist  with PT, min assist with OT, n/a with SLP. 8. Estimated rehab length of stay to reach the above functional goals is: NA 9. Anticipated D/C setting: SNF 10. Anticipated post D/C treatments: SNF 11. Overall Rehab/Functional Prognosis: fair  RECOMMENDATIONS: This patient's condition is appropriate for continued rehabilitative care in the following setting: SNF Patient has agreed to participate in recommended program. Potentially Note that insurance prior authorization may be required for reimbursement for recommended care.  Comment: Rehab goals would be at 24/7 min assist level which the patient does not have reportedly.  Had been having trouble managing at home,bed bug infestation,  not following up with wound care for chronic lower ext lymphedema/stasis changes prior to hospitalization.  "I have personally performed a face to face diagnostic evaluation of this patient.  Additionally, I have reviewed and concur with the physician assistant's documentation above."  Charlett Blake M.D. [facility] FAAPM&R (Sports Med, Neuromuscular Med) Diplomate Am Board of Electrodiagnostic Med  Elizabeth Sauer 05/25/2018

## 2018-05-25 NOTE — Progress Notes (Signed)
CSW noted CIR will not accept patient. Patient refused SNF and CM following for home health needs. CSW noted MD does not think patient safe for discharge home and will discuss with daughter. CSW to continue to follow for disposition needs  [name], [place]

## 2018-05-25 NOTE — Progress Notes (Signed)
Occupational Therapy Treatment Patient Details Name: [name] MRN: [id number] DOB: [birth year] Today's Date: 05/25/2018    History of present illness [name] is a 77yo M presenting with 3 day history of progressively worsening shortness of breath.  PMH significant for T2DM, BLE lymphedema, elephantiasis nostra verrucosa of RLE.    OT comments  Pt able to stand with sara stedy and stand and pivot to chair with +2 min assist and use of bariatric RW. Pt attempted to stand with crutches only, but unsuccessful. Requires extended period of time to evaluate situation and safety of equipment prior to mobilizing. Pt remained up in elevated chair at end of session. Updated d/c disposition to SNF.  Follow Up Recommendations  SNF    Equipment Recommendations  None recommended by OT    Recommendations for Other Services      Precautions / Restrictions Precautions Precautions: Fall       Mobility Bed Mobility Overal bed mobility: Needs Assistance Bed Mobility: Supine to Sit     Supine to sit: Supervision     General bed mobility comments: increased time, use of rail, HOB up  Transfers Overall transfer level: Needs assistance Equipment used: Rolling walker (2 wheeled) Transfers: Sit to/from [employer] Transfers Sit to Stand: From elevated surface;+2 physical assistance;Min assist Stand pivot transfers: +2 physical assistance;Min assist;From elevated surface       General transfer comment: pt able to stand with sara stedy and bariatric RW with +2 assist, but unable to stand with crutches. Assist to rise and steady. 4 folded quilts placed in bottom of recliner to elevate.    Balance Overall balance assessment: Needs assistance   Sitting balance-Leahy Scale: Fair     Standing balance support: Bilateral upper extremity supported Standing balance-Leahy Scale: Poor                             ADL either performed or assessed with clinical judgement    ADL                           Toilet Transfer: +2 for physical assistance;Minimal assistance;Stand-pivot;RW Toilet Transfer Details (indicate cue type and reason): simulated to chair           General ADL Comments: Pt stood x 1 from elevated bed with bari stedy. Went well, so attempted crutches with pt unable to stand. Eventually transferred with bari RW.     Vision       Perception     Praxis      Cognition Arousal/Alertness: Awake/alert Behavior During Therapy: Flat affect Overall Cognitive Status: Within Functional Limits for tasks assessed                                 General Comments: pt requires extensive period of time to consider therapist's requests        Exercises     Shoulder Instructions       General Comments      Pertinent Vitals/ Pain       Pain Assessment: No/denies pain  Home Living                                          Prior Functioning/Environment  Frequency  Min 2X/week        Progress Toward Goals  OT Goals(current goals can now be found in the care plan section)  Progress towards OT goals: Progressing toward goals  Acute Rehab OT Goals Patient Stated Goal: to return home OT Goal Formulation: With patient Time For Goal Achievement: 06/03/18 Potential to Achieve Goals: Good  Plan Discharge plan needs to be updated(CIR recommending SNF)    Co-evaluation    PT/OT/SLP Co-Evaluation/Treatment: Yes Reason for Co-Treatment: For patient/therapist safety   OT goals addressed during session: Strengthening/ROM      AM-PAC PT "6 Clicks" Daily Activity     Outcome Measure   Help from another person eating meals?: None Help from another person taking care of personal grooming?: A Little Help from another person toileting, which includes using toliet, bedpan, or urinal?: Total Help from another person bathing (including washing, rinsing, drying)?: A Lot Help  from another person to put on and taking off regular upper body clothing?: A Little Help from another person to put on and taking off regular lower body clothing?: Total 6 Click Score: 14    End of Session Equipment Utilized During Treatment: Rolling walker;Gait belt;Oxygen  OT Visit Diagnosis: Unsteadiness on feet (R26.81);Muscle weakness (generalized) (M62.81)   Activity Tolerance Patient tolerated treatment well   Patient Left in chair;with call bell/phone within reach   Nurse Communication Mobility status        Time: 1015-1100 OT Time Calculation (min): 45 min  Charges: OT General Charges $OT Visit: 1 Visit OT Treatments $Therapeutic Activity: 8-22 mins  05/25/2018 Nestor Lewandowsky, OTR/L Pager: [phone]   Werner Lean, Haze Boyden 05/25/2018, 1:18 PM

## 2018-05-25 NOTE — Progress Notes (Signed)
Inpatient Rehabilitation-Admissions Coordinator   Met with patient at the bedside as follow up from PM&R consult. AC discussed recommendations for rehab venue. Pt is aware that they are not a candidate for CIR at this time. AC has contacted SW regarding recommendation. CIR will sign off at this time. Please call for questions.   Jhonnie Garner, OTR/L  Rehab Admissions Coordinator  [phone] 05/25/2018 2:33 PM

## 2018-05-25 NOTE — Progress Notes (Signed)
Family Medicine Teaching Service Daily Progress Note Intern Pager: [phone]  Patient name: [name] Medical record number: [id number] Date of birth: [birth year] Age: 77 y.o. Gender: male  Primary Care Provider: Tonette Bihari, MD Consultants: none Code Status: FULL  Pt Overview and Major Events to Date:  5/20: patient admitted to teaching service 5/23: Echo notable for G2DD, EF 60-65%  Assessment and Plan: [name] is a 77yo M presenting with 3 day history of progressively worsening shortness of breath.  PMH significant for T2DM, BLE lymphedema, elephantiasis nostra verrucosa of RLE.   Hypoxia: Improved. Likely secondary to CHF exacerbation (newly diagnosed). Echo: grade 2 diastolic dysfunction, LVED 60-65%. Suspect contribution of OSA and OHSgiven morbid obesity, as well. Net 5.9 L negative. - continue PO Lasix 80 mg BID - Daily Weights - Strict I/O's - monitor BMP - will need supplemental O2 upon discharge  Lives alone with limited help: Patient agreeable to [name] after discussing with family. - CSW consulted, appreciate recs - follow up [name] consultation - OT recommends home health OT  Chest Pain, resolved: Patient had episode of chest pain with negative troponin x2. Has not recurred.  - aspirin 81mg  qhs  Elephantiasis nostra verrucosa  Bilateral LE Lymphedema: Has not seen wound clinic for the past three months.  Not on ABX.  - Diuresing as above - per wound care - continue to apply Ammonium lactate 12% lotion  Anemia of Chronic Disease: Stable. Unknown etiology, could be secondary to possible CHF.  T2DM: Ha1c is 9.3 on admission.  CBG this AM 137 - sensitive SSI - continue decreased dose of levemir 20 U daily  Pressure ulcer of upper posterior thigh, right, stage III - Wound care per Wound Care service  Monilla Intertrigo of abdominal pannus and intergluteal cleft  - Nystatin powder bid  Prostatic Adenocarcinoma: Per patient, urologist is  [name], per their office his last visit was Dec 2017.  CT abdomen/pelvis negative for any obvious pathology concerning for metastatic cancer, however prostate was not entirely visualized.  This appears to not be concerning for acute process, recommend further outpatient work-up. PSA elevated at 12.94 on 5/21.  - Outpatient follow-up warranted - continue flomax  Right Cheek Mass: See prior notes.  Appears to be stable at this time, will recommend further outpatient workup.   Infestation by bed bug Appears to be resolved. Patient reports bed bugs "should be gone by now," although unclear if he actually had professional help come in. [facility] will investigate, appreciate assistance.   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 Breathing is good. No other complaints.   Objective: Temp:  [97.9 F (36.6 C)-99 F (37.2 C)] 98.3 F (36.8 C) (05/27 0359) Pulse Rate:  [67-80] 76 (05/27 0359) Resp:  [15-22] 22 (05/27 0359) BP: (95-123)/(56-67) 116/63 (05/27 0359) SpO2:  [92 %-99 %] 92 % (05/27 0359) FiO2 (%):  [22 %] 22 % (05/26 1002) Weight:  [316 lb 6.4 oz (143.5 kg)] 316 lb 6.4 oz (143.5 kg) (05/27 0359) Physical Exam:  Gen: obese elderly gentleman sitting up in bed in NAD, [place] in place Heart: 3/6 systolic murmur Lungs: CTA bilaterally, no increased work of breathing Abdomen: soft, non-tender, non-distended, +BS Extremities: legs wrapped bilaterally with +2 pitting edema and chronic  skin changes Neuro: no focal deficits   Laboratory: Recent Labs  Lab 05/20/18 0426 05/21/18 0214 05/22/18 0532  WBC 9.4 7.9 6.7  HGB 9.1* 9.6* 10.0*  HCT 30.7* 32.8* 32.9*  PLT 265 311 299   Recent Labs  Lab 05/19/18 0848   05/21/18 0214 05/22/18 0532 05/24/18 0037  NA  --    < > 142 139 136  K  --    < > 3.6 4.2 4.0  CL  --    < > 96* 91* 87*  CO2  --    < > 36* 40* 38*  BUN  --    < > 12 13 14   CREATININE  --    < > 0.98 1.07 1.12  CALCIUM  --    < > 8.5* 8.4* 8.7*  PROT 8.5*  --   --   --   --   BILITOT 0.5  --   --   --   --   ALKPHOS 120  --   --   --   --   ALT 12*  --   --   --   --   AST 17  --   --   --   --   GLUCOSE  --    < > 108* 83 137*   < > = values in this interval not displayed.   ECHO: - Left ventricle: The cavity size was normal. There was mild   concentric hypertrophy. Systolic function was normal. The   estimated ejection fraction was in the range of 60% to 65%. Wall   motion was normal; there were no regional wall motion   abnormalities. Features are consistent with a pseudonormal left   ventricular filling pattern, with concomitant abnormal relaxation   and increased filling pressure (grade 2 diastolic dysfunction).   Doppler parameters are consistent with high ventricular filling   pressure. - Aortic valve: Valve mobility was restricted. - Mitral valve: Calcified annulus. Mild focal calcification of the   anterior leaflet. - Left atrium: The atrium was mildly dilated. - Right ventricle: The cavity size was mildly dilated. Wall   thickness was normal. Systolic function was mildly reduced. - Tricuspid valve: There was trivial regurgitation. - Pulmonary arteries: Systolic pressure could not be accurately   estimated.  Joel Rattler, DO 05/25/2018, 6:58 AM PGY-2, [place] Intern pager: [phone], text pages welcome

## 2018-05-26 DIAGNOSIS — I519 Heart disease, unspecified: Secondary | ICD-10-CM

## 2018-05-26 DIAGNOSIS — I5043 Acute on chronic combined systolic (congestive) and diastolic (congestive) heart failure: Secondary | ICD-10-CM

## 2018-05-26 DIAGNOSIS — I5189 Other ill-defined heart diseases: Secondary | ICD-10-CM

## 2018-05-26 LAB — GLUCOSE, CAPILLARY
GLUCOSE-CAPILLARY: 154 mg/dL — AB (ref 65–99)
Glucose-Capillary: 149 mg/dL — ABNORMAL HIGH (ref 65–99)
Glucose-Capillary: 182 mg/dL — ABNORMAL HIGH (ref 65–99)
Glucose-Capillary: 141 mg/dL — ABNORMAL HIGH (ref 65–99)
Glucose-Capillary: 128 mg/dL — ABNORMAL HIGH (ref 65–99)

## 2018-05-26 MED ORDER — POLYETHYLENE GLYCOL 3350 17 G PO PACK
17.0000 g | PACK | Freq: Once | ORAL | Status: AC
  Administered 2018-05-26: 17 g via ORAL
  Filled 2018-05-26: qty 1

## 2018-05-26 MED ORDER — SORBITOL 70 % SOLN
960.0000 mL | TOPICAL_OIL | Freq: Once | ORAL | Status: DC
  Filled 2018-05-26 (×2): qty 473

## 2018-05-26 NOTE — Care Management (Addendum)
1330 05-26-18 Jacqlyn Krauss, RN,BSN [phone] CM was able to contact daughter Gatha Mayer and she will bing clothes for patient prior to transfer home. Daughter is aware that pt will need PTAR. Home 02 will need to be delivered to Apartment prior to arrival. Daughter is aware to call [facility] to make arrangements for 02 PTA of patient. Oxygen will need to be in the home before PTAR is notified. Order for DME RW sent to [employer] as well. [facility] to provide [facility], RN,PT, OT, SW. Time of  02 delivery is between 4:00 pm and 8:00 pm per [facility]. Staff RN to call PTAR post 02 delivery at the after hours # [phone]. No further needs from CM at this time.    1132 05-26-18 Jacqlyn Krauss, RN,BSN [phone] CM did speak with patient and he will need PTAR transport home. CM will need to discuss with [facility] in regards to delivery to [place] 05-26-18 Jacqlyn Krauss, RN,BSN [phone] CSW did speak with patient and he is still declining SNF at this time. Plan will be for home with [place]. Previous CM did send referral to [facility] for RN, PT,OT- This CM did ask MD for [facility] CSW as well. [place] Liaison did speak with patient this am and per pt his home has been exterminated by [facility] will have to see documentation before the [facility] Staff will be able to provide services-Pt is aware. Staff RN to see if pt will need 02 prior to transition home. Staff RN states pt sat's decreased to 87% and increased to 95%. CM did speak with MD to make sure 02 orders are placed in Epic with Liter flow if still needed. No further needs from CM at this time.

## 2018-05-26 NOTE — Progress Notes (Signed)
Family Medicine Teaching Service Daily Progress Note Intern Pager: [phone]  Patient name: [name] Medical record number: [id number] Date of birth: [birth year] Age: 77 y.o. Gender: male  Primary Care Provider: Tonette Bihari, MD Consultants: none Code Status: FULL  Pt Overview and Major Events to Date:  5/20: patient admitted to teaching service 5/23: Echo notable for G2DD, EF 60-65%  Assessment and Plan: [name] is a 77yo M presenting with 3 day history of progressively worsening shortness of breath.  PMH significant for T2DM, BLE lymphedema, elephantiasis nostra verrucosa of RLE.   Hypoxia: stable on room air. Likely secondary to CHF exacerbation (newly diagnosed). Echo: grade 2 diastolic dysfunction, LVED 60-65%. Suspect contribution of OSA and OHSgiven morbid obesity, as well. Net 5.6 L negative. - continue PO Lasix 80 mg BID - Daily Weights - Strict I/O's - monitor BMP - will need supplemental O2 upon discharge  Lives alone with limited help: Patient agreeable to CIR after discussing with family. - CSW consulted, appreciate recs - follow up [place] consultation - OT recommends home health OT  Chest Pain, resolved: Patient had episode of chest pain with negative troponin x2. Has not recurred.  - aspirin 81mg  qhs  Elephantiasis nostra verrucosa  Bilateral LE Lymphedema: Has not seen wound clinic for the past three months.  Not on ABX.  - Diuresing as above - per wound care - continue to apply Ammonium lactate 12% lotion  Anemia of Chronic Disease: Stable. Unknown etiology, could be secondary to possible CHF.  T2DM: Ha1c is 9.3 on admission.  CBG this AM 137 - sensitive SSI - continue decreased dose of levemir 20 U daily  Pressure ulcer of upper posterior thigh, right, stage III - Wound care per Wound Care service  Monilla Intertrigo of abdominal pannus and intergluteal cleft  - Nystatin powder bid  Prostatic Adenocarcinoma: Per patient,  urologist is [facility], per their office his last visit was Dec 2017.  CT abdomen/pelvis negative for any obvious pathology concerning for metastatic cancer, however prostate was not entirely visualized.  This appears to not be concerning for acute process, recommend further outpatient work-up. PSA elevated at 12.94 on 5/21.  - Outpatient follow-up warranted - continue flomax  Right Cheek Mass: See prior notes.  Appears to be stable at this time, will recommend further outpatient workup.   Infestation by bed bug Appears to be resolved. Patient reports bed bugs "should be gone by now," although unclear if he actually had professional help come in. [facility] will investigate, appreciate assistance.   Chronic Medical Conditions managed on home medications: GERD: protonix HLD: pravastatin 40mg  Onychogryphosis of toes, R > L H/o Prostatic Adenocarcinoma: Flomax  FEN/GI: heart healthy, carb-modified diet PPx: Lovenox @ 0.5mg /kg  Disposition: Patient refused SNF and wants home w/ home health  Subjective: Patient feels improved and would like to go home  Objective: Temp:  [98 F (36.7 C)-99.2 F (37.3 C)] 98 F (36.7 C) (05/28 0743) Pulse Rate:  [75-84] 84 (05/28 0315) Resp:  [17-25] 25 (05/28 0743) BP: (98-115)/(57-68) 101/63 (05/28 0743) SpO2:  [94 %-100 %] 97 % (05/28 0920) Weight:  [308 lb 8 oz (139.9 kg)] 308 lb 8 oz (139.9 kg) (05/28 0315) Physical Exam:  Gen: obese elderly gentleman sitting up in bed in NAD, on room air Heart: RRR, 2/6 murmur Lungs: CTAB, no IWB Abdomen: soft, no TTP Extremities: legs wrapped bilaterally with +2 pitting edema and chronic skin changes Neuro: no focal deficits   Laboratory:  Recent Labs  Lab 05/20/18 0426 05/21/18 0214 05/22/18 0532  WBC 9.4 7.9 6.7  HGB 9.1* 9.6* 10.0*  HCT 30.7* 32.8* 32.9*  PLT 265 311 299   Recent Labs  Lab 05/21/18 0214 05/22/18 0532 05/24/18 0037  NA 142 139 136  K 3.6 4.2 4.0  CL 96* 91* 87*  CO2 36*  40* 38*  BUN 12 13 14   CREATININE 0.98 1.07 1.12  CALCIUM 8.5* 8.4* 8.7*  GLUCOSE 108* 83 137*   ECHO: - Left ventricle: The cavity size was normal. There was mild   concentric hypertrophy. Systolic function was normal. The   estimated ejection fraction was in the range of 60% to 65%. Wall   motion was normal; there were no regional wall motion   abnormalities. Features are consistent with a pseudonormal left   ventricular filling pattern, with concomitant abnormal relaxation   and increased filling pressure (grade 2 diastolic dysfunction).   Doppler parameters are consistent with high ventricular filling   pressure. - Aortic valve: Valve mobility was restricted. - Mitral valve: Calcified annulus. Mild focal calcification of the   anterior leaflet. - Left atrium: The atrium was mildly dilated. - Right ventricle: The cavity size was mildly dilated. Wall   thickness was normal. Systolic function was mildly reduced. - Tricuspid valve: There was trivial regurgitation. - Pulmonary arteries: Systolic pressure could not be accurately   estimated.  Sherene Sires, DO 05/26/2018, 9:48 AM PGY-1, [place] Intern pager: [phone], text pages welcome

## 2018-05-26 NOTE — Discharge Instructions (Signed)
You were admitted for heart failure and too much fluid which made you short of breath. We gave you medicine to help you pee out extra fluid. You will continue taking this at home.  Please review your new medications as they have been changed. Please decrease your levemir to 20 units once daily to avoid your sugars going too low.  Please follow up with Dr. Emmaline Life on Thursday to follow up on your hospital stay and ensure you are staying well.   Heart Failure Heart failure means your heart has trouble pumping blood. This makes it hard for your body to work well. Heart failure is usually a long-term (chronic) condition. You must take good care of yourself and follow your doctor's treatment plan. Follow these instructions at home:  Take your heart medicine as told by your doctor. ? Do not stop taking medicine unless your doctor tells you to. ? Do not skip any dose of medicine. ? Refill your medicines before they run out. ? Take other medicines only as told by your doctor or pharmacist.  Stay active if told by your doctor. The elderly and people with severe heart failure should talk with a doctor about physical activity.  Eat heart-healthy foods. Choose foods that are without trans fat and are low in saturated fat, cholesterol, and salt (sodium). This includes fresh or frozen fruits and vegetables, fish, lean meats, fat-free or low-fat dairy foods, whole grains, and high-fiber foods. Lentils and dried peas and beans (legumes) are also good choices.  Limit salt if told by your doctor.  Cook in a healthy way. Roast, grill, broil, bake, poach, steam, or stir-fry foods.  Limit fluids as told by your doctor.  Weigh yourself every morning. Do this after you pee (urinate) and before you eat breakfast. Write down your weight to give to your doctor.  Take your blood pressure and write it down if your doctor tells you to.  Ask your doctor how to check your pulse. Check your pulse as told.  Lose  weight if told by your doctor.  Stop smoking or chewing tobacco. Do not use gum or patches that help you quit without your doctor's approval.  Schedule and go to doctor visits as told.  Nonpregnant women should have no more than 1 drink a day. Men should have no more than 2 drinks a day. Talk to your doctor about drinking alcohol.  Stop illegal drug use.  Stay current with shots (immunizations).  Manage your health conditions as told by your doctor.  Learn to manage your stress.  Rest when you are tired.  If it is really hot outside: ? Avoid intense activities. ? Use air conditioning or fans, or get in a cooler place. ? Avoid caffeine and alcohol. ? Wear loose-fitting, lightweight, and light-colored clothing.  If it is really cold outside: ? Avoid intense activities. ? Layer your clothing. ? Wear mittens or gloves, a hat, and a scarf when going outside. ? Avoid alcohol.  Learn about heart failure and get support as needed.  Get help to maintain or improve your quality of life and your ability to care for yourself as needed. Contact a doctor if:  You gain weight quickly.  You are more short of breath than usual.  You cannot do your normal activities.  You tire easily.  You cough more than normal, especially with activity.  You have any or more puffiness (swelling) in areas such as your hands, feet, ankles, or belly (abdomen).  You cannot  sleep because it is hard to breathe.  You feel like your heart is beating fast (palpitations).  You get dizzy or light-headed when you stand up. Get help right away if:  You have trouble breathing.  There is a change in mental status, such as becoming less alert or not being able to focus.  You have chest pain or discomfort.  You faint. This information is not intended to replace advice given to you by your health care provider. Make sure you discuss any questions you have with your health care provider. Document Released:  09/24/2008 Document Revised: 05/23/2016 Document Reviewed: 02/01/2013 Elsevier Interactive Patient Education  2017 [employer].

## 2018-05-26 NOTE — Progress Notes (Signed)
Physical Therapy Treatment Patient Details Name: [name] MRN: [id number] DOB: September 08, 1941 Today's Date: 05/26/2018    History of Present Illness [name] is a 77yo M presenting with 3 day history of progressively worsening shortness of breath.  PMH significant for T2DM, BLE lymphedema, elephantiasis nostra verrucosa of RLE.     PT Comments    Pt admitted with above diagnosis. Pt currently with functional limitations due to the deficits listed below (see PT Problem List). Pt was able to transfer to recliner from bed with min guard assist.  Pt needs min assist for bed mobility.  Sats >90% today. Still needs short term SNF.  Will continue acute PT.  Pt will benefit from skilled PT to increase their independence and safety with mobility to allow discharge to the venue listed below.     Follow Up Recommendations  SNF     Equipment Recommendations  Rolling walker with 5" wheels(bariatric)    Recommendations for Other Services       Precautions / Restrictions Precautions Precautions: Fall Restrictions Weight Bearing Restrictions: No    Mobility  Bed Mobility Overal bed mobility: Needs Assistance Bed Mobility: Supine to Sit Rolling: Supervision Sidelying to sit: Min assist;Min guard Supine to sit: Supervision;HOB elevated;Min guard     General bed mobility comments: Pt encouraged to move self to EOB and he was successful with a slight assist   Transfers Overall transfer level: Needs assistance Equipment used: Rolling walker (2 wheeled) Transfers: Sit to/from [facility] Sit to Stand: +2 physical assistance;Min guard Stand pivot transfers: +2 physical assistance;Min guard       General transfer comment: Raised bed up to assist with sit to stand.  STeady overall coming to stand.  Slow pivotal steps from  bed to recliner.   Ambulation/Gait                 Stairs             Wheelchair Mobility    Modified Rankin (Stroke Patients  Only)       Balance Overall balance assessment: Needs assistance Sitting-balance support: Bilateral upper extremity supported;Feet supported Sitting balance-Leahy Scale: Fair     Standing balance support: Bilateral upper extremity supported Standing balance-Leahy Scale: Poor Standing balance comment: walker and min guard assist for static standing                            Cognition Arousal/Alertness: Awake/alert Behavior During Therapy: Flat affect Overall Cognitive Status: Within Functional Limits for tasks assessed Area of Impairment: Safety/judgement                         Safety/Judgement: Decreased awareness of safety;Decreased awareness of deficits     General Comments: pt requires extensive period of time to consider therapist's requests      Exercises General Exercises - Lower Extremity Ankle Circles/Pumps: AROM;Both;10 reps;Seated Heel Slides: AROM;Both;10 reps;Supine    General Comments General comments (skin integrity, edema, etc.): SpO2 >90% on RA      Pertinent Vitals/Pain Pain Assessment: No/denies pain    Home Living                      Prior Function            PT Goals (current goals can now be found in the care plan section) Acute Rehab PT Goals Patient Stated Goal: to return home  Progress towards PT goals: Progressing toward goals    Frequency    Min 3X/week      PT Plan Current plan remains appropriate    Co-evaluation              AM-PAC PT "6 Clicks" Daily Activity  Outcome Measure  Difficulty turning over in bed (including adjusting bedclothes, sheets and blankets)?: A Little Difficulty moving from lying on back to sitting on the side of the bed? : A Little Difficulty sitting down on and standing up from a chair with arms (e.g., wheelchair, bedside commode, etc,.)?: A Little Help needed moving to and from a bed to chair (including a wheelchair)?: A Little Help needed walking in  hospital room?: Total Help needed climbing 3-5 steps with a railing? : Total 6 Click Score: 14    End of Session Equipment Utilized During Treatment: Gait belt Activity Tolerance: Patient tolerated treatment well Patient left: with call bell/phone within reach;in chair Nurse Communication: Mobility status PT Visit Diagnosis: Muscle weakness (generalized) (M62.81);Difficulty in walking, not elsewhere classified (R26.2)     Time: 1030-1045 PT Time Calculation (min) (ACUTE ONLY): 15 min  Charges:  $Therapeutic Activity: 8-22 mins                    G Codes:       [name],PT Acute Rehabilitation [phone] (pager)    Denice Paradise 05/26/2018, 11:31 AM

## 2018-05-26 NOTE — Plan of Care (Signed)
  Problem: Clinical Measurements: Goal: Respiratory complications will improve Outcome: Progressing   

## 2018-05-26 NOTE — Progress Notes (Signed)
SATURATION QUALIFICATIONS: (This note is used to comply with regulatory documentation for home oxygen)  Patient Saturations on Room Air at Rest = 97%  Patient Saturations on Room Air while exerting with movement in bed = 87%  Patient Saturations on 2 Liters of oxygen while exerting = 95%  Please briefly explain why patient needs home oxygen: The patient during exertion dropped oxygen level.   Saddie Benders RN

## 2018-05-26 NOTE — Clinical Social Work Note (Signed)
Clinical Social Work Assessment  Patient Details  Name: [name] MRN: [id number] Date of Birth: 17-Apr-1941  Date of referral:  05/26/18               Reason for consult:  Facility Placement, Discharge Planning                Permission sought to share information with:    Permission granted to share information::  Yes, Verbal Permission Granted  Name::     Cheri Rous  Agency::     Relationship::  daughter  Contact Information:  [phone]  Housing/Transportation Living arrangements for the past 2 months:  Apartment Source of Information:  Patient Patient Interpreter Needed:  None Criminal Activity/Legal Involvement Pertinent to Current Situation/Hospitalization:  No - Comment as needed Significant Relationships:  Adult Children, Other Family Members Lives with:  Self Do you feel safe going back to the place where you live?  Yes Need for family participation in patient care:  No (Coment)  Care giving concerns: Patient from home alone. PT recommending SNF, but patient refusing.  Social Worker assessment / plan: CSW met with patient at bedside. Patient alert and oriented. CSW introduced self and role and discussed disposition planning. CSW reviewed PT recommendation for SNF. Patient declined SNF, stating he wants to go home. Patient lives in an apartment independently. His daughter lives in the area and helps him occasionally.   Patient uses an electic scooter and crutches at baseline. Patient indicated he is at baseline and feels he can manage well at home by himself. Patient agreeable to home health services. Patient reported his apartment has been treated for bed bugs and apartment complex will be able to furnish proof of the treatment to home health agency.  RNCM aware and following for home health needs. CSW signing off.  Employment status:  Retired Archivist) PT Recommendations:  [place] /  Referral to community resources:  [place], Other (Comment Required)(home health - refusing SNF)  Patient/Family's Response to care: Patient appreciative of care.  Patient/Family's Understanding of and Emotional Response to Diagnosis, Current Treatment, and Prognosis: Patient with understanding of condition. Refusing SNF but agreeable to home health care.  Emotional Assessment Appearance:  Appears stated age Attitude/Demeanor/Rapport:  Engaged Affect (typically observed):  Appropriate, Calm Orientation:  Oriented to Self, Oriented to Place, Oriented to  Time, Oriented to Situation Alcohol / Substance use:  Not Applicable Psych involvement (Current and /or in the community):  No (Comment)  Discharge Needs  Concerns to be addressed:  Discharge Planning Concerns, Care Coordination Readmission within the last 30 days:  No Current discharge risk:  Physical Impairment Barriers to Discharge:  Continued Medical Work up   Estanislado Emms, LCSW 05/26/2018, 10:26 AM

## 2018-05-27 LAB — BASIC METABOLIC PANEL
ANION GAP: 14 (ref 5–15)
BUN: 38 mg/dL — ABNORMAL HIGH (ref 6–20)
CHLORIDE: 81 mmol/L — AB (ref 101–111)
CO2: 36 mmol/L — AB (ref 22–32)
Calcium: 9 mg/dL (ref 8.9–10.3)
Creatinine, Ser: 2.33 mg/dL — ABNORMAL HIGH (ref 0.61–1.24)
GFR calc Af Amer: 29 mL/min — ABNORMAL LOW (ref >60)
GFR calc non Af Amer: 25 mL/min — ABNORMAL LOW (ref >60)
GLUCOSE: 194 mg/dL — AB (ref 65–99)
POTASSIUM: 4.3 mmol/L (ref 3.5–5.1)
Sodium: 131 mmol/L — ABNORMAL LOW (ref 135–145)

## 2018-05-27 LAB — CREATININE, SERUM
CREATININE: 2.34 mg/dL — AB (ref 0.61–1.24)
GFR calc Af Amer: 29 mL/min — ABNORMAL LOW (ref >60)
GFR calc non Af Amer: 25 mL/min — ABNORMAL LOW (ref >60)

## 2018-05-27 LAB — GLUCOSE, CAPILLARY
GLUCOSE-CAPILLARY: 107 mg/dL — AB (ref 65–99)
GLUCOSE-CAPILLARY: 152 mg/dL — AB (ref 65–99)
GLUCOSE-CAPILLARY: 197 mg/dL — AB (ref 65–99)
Glucose-Capillary: 147 mg/dL — ABNORMAL HIGH (ref 65–99)
Glucose-Capillary: 167 mg/dL — ABNORMAL HIGH (ref 65–99)
Glucose-Capillary: 176 mg/dL — ABNORMAL HIGH (ref 65–99)
Glucose-Capillary: 181 mg/dL — ABNORMAL HIGH (ref 65–99)

## 2018-05-27 MED ORDER — ENOXAPARIN SODIUM 30 MG/0.3ML ~~LOC~~ SOLN: 30.0000 mg | SUBCUTANEOUS | Status: DC

## 2018-05-27 MED ORDER — SODIUM CHLORIDE 0.9 % IV SOLN
INTRAVENOUS | Status: AC
  Administered 2018-05-27 – 2018-05-28 (×2): via INTRAVENOUS

## 2018-05-27 MED ORDER — SODIUM CHLORIDE 0.9 % IV SOLN: INTRAVENOUS | Status: DC

## 2018-05-27 NOTE — Care Management (Signed)
[id number] 05-27-18  CM received call from MD in regards to 02 delivery-(per daughter 52 had not been delivered). CM then called [facility] and spoke with Tabitha in regards to 02 at [facility]. Per Representative 02 arrived and no one was available to receive- note was left in system with [facility] at 8:30 pm. (unsure of what happened) CM then called daughter back and made her aware that she needs to call [facility] to reschedule delivery for 02. Ticket # [id number]. CM also made daughter aware that pt has no intensity to remain in the hospital and Insurance may send the patient a bill from 05-26-18 to 05-27-18. Daughter called this CM back @ [phone] to make her aware that new delivery time for 02 will be between (1-5) pm. CM did call Liaison onsite for [facility] to make them aware of situation and that pt is still hospitalized. No further needs from CM at this time. Graves[place]el[place], Ocie Cornfield RN, BSN Case Manager

## 2018-05-27 NOTE — Progress Notes (Signed)
Occupational Therapy Treatment Patient Details Name: [name] MRN: [id number] DOB: 16-Jan-1941 Today's Date: 05/27/2018    History of present illness Diante Barley is a 77yo M presenting with 3 day history of progressively worsening shortness of breath.  PMH significant for T2DM, BLE lymphedema, elephantiasis nostra verrucosa of RLE.    OT comments  Focus of session on educating pt in LB ADL using AE and bed mobility using leg lifter to return to supine. Pt continues to refuse SNF, although he is unsafe to return home alone. He reports he has adequate assistance at home.   Follow Up Recommendations  Home health OT(pt is refusing SNF)    Equipment Recommendations  (bariatric RW)    Recommendations for Other Services      Precautions / Restrictions Precautions Precautions: Fall       Mobility Bed Mobility Overal bed mobility: Needs Assistance Bed Mobility: Supine to Sit;Sit to Supine   Sidelying to sit: Supervision;HOB elevated   Sit to supine: Mod assist   General bed mobility comments: issued pt leg lifter and instructed in use  Transfers                      Balance Overall balance assessment: Needs assistance   Sitting balance-Leahy Scale: Fair                                     ADL either performed or assessed with clinical judgement   ADL Overall ADL's : Needs assistance/impaired               Lower Body Bathing Details (indicate cue type and reason): educated in use of long handled bath sponge     Lower Body Dressing: Moderate assistance;Sitting/lateral leans Lower Body Dressing Details (indicate cue type and reason): instructed in use of sock aide and practiced use of reacher                     Vision       Perception     Praxis      Cognition Arousal/Alertness: Awake/alert Behavior During Therapy: WFL for tasks assessed/performed Overall Cognitive Status: Within Functional Limits for tasks assessed                                 General Comments: pt requires extensive period of time to consider therapist's requests        Exercises     Shoulder Instructions       General Comments      Pertinent Vitals/ Pain       Pain Assessment: No/denies pain  Home Living                                          Prior Functioning/Environment              Frequency  Min 2X/week        Progress Toward Goals  OT Goals(current goals can now be found in the care plan section)  Progress towards OT goals: Progressing toward goals  Acute Rehab OT Goals Patient Stated Goal: to return home OT Goal Formulation: With patient Time For Goal Achievement: 06/03/18 Potential to Achieve Goals: Good  Plan Discharge plan  needs to be updated    Co-evaluation                 AM-PAC PT "6 Clicks" Daily Activity     Outcome Measure   Help from another person eating meals?: None Help from another person taking care of personal grooming?: A Little Help from another person toileting, which includes using toliet, bedpan, or urinal?: A Lot Help from another person bathing (including washing, rinsing, drying)?: A Lot Help from another person to put on and taking off regular upper body clothing?: None Help from another person to put on and taking off regular lower body clothing?: A Lot 6 Click Score: 17    End of Session    OT Visit Diagnosis: Unsteadiness on feet (R26.81);Muscle weakness (generalized) (M62.81)   Activity Tolerance Patient tolerated treatment well   Patient Left in bed;with call bell/phone within reach   Nurse Communication          Time: 3299-2426 OT Time Calculation (min): 40 min  Charges: OT General Charges $OT Visit: 1 Visit OT Treatments $Self Care/Home Management : 38-52 mins  05/27/2018 Nestor Lewandowsky, OTR/L Pager: [phone] Werner Lean, Haze Boyden 05/27/2018, 1:14 PM

## 2018-05-27 NOTE — Discharge Summary (Signed)
[facility] Discharge Summary  **UPDATED D/C SUMMARY AS PATIENT STAYED AFTER LAST SUMMARY WAS FILED**   Patient name: [name] record number: [id number] Date of birth: 08/08/1941 Age: 77 y.o. Gender: male Date of Admission: 05/19/2018  Date of Discharge: 05/29/18  Admitting Physician: Blane Ohara McDiarmid, MD  Primary Care Provider: Tonette Bihari, MD Consultants: Wound Care  Indication for Hospitalization: Dyspnea concerning for CHF  Discharge Diagnoses/Problem List:  Heart Failure with preserved Ejection Fraction, Newly Diagnosed Elephantiasis nostra verrucosa  Bilateral LE Lymphedema Anemia of Chronic Disease T2DM Pressure ulcer of upper posterior thigh, right, stage III Monilla Intertrigo of abdominal pannus and intergluteal cleft Prostatic Adenocarcinoma:  Right Cheek Mass Infestation by bed bug GERD HLD Onychogryphosis of toes, 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  Disposition: Home with home OT  Discharge Condition: Stable  Discharge Exam:  Gen: Alert and Oriented x 3, NAD HEENT: Normocephalic, atraumatic, PERRLA, EOMI CV: RRR, 2/6 systolic murmurs, normal S1, S2 split, +2 pulses dorsalis pedis bilaterally Resp: CTAB, no wheezing, rales, or rhonchi, comfortable work of breathing Abd: non-distended, non-tender, soft, +bs in all four quadrants MSK: Moves in all four extremities Ext: no clubbing, cyanosis, or +1 edema above leg wraps Skin: warm, dry, intact, no rashes  Brief Hospital Course:  [name] presented to the hospital complaining of 3 days worsening dyspnea.  His presentation was concerning for pulmonary embolism (PE) vs new onset congestive heart failure.  CTA was negative for evidence of PE.  Given his history of prostatic adenocarcinoma without follow-up and vague abdominal pain, CT of abdomen and pelvis was ordered and was negative for signs of obvious pathology (see imaging results, below).  Patient underwent diuresis with symptomatic  improvement and underwent echocardiogram on hospital day 3 which showed grade 2 diastolic dysfunction and LVEF 60-65%.  Patient was diagnosed with HFpEF as the etiology of his initial presentation with respiratory distress. His weight upon discharge was 308 pounds. PT/OT evaluated patient and recommended CIR. CIR evaluated patient and recommended SNF. Patient adamantly refused SNF. He was discharged home with home O2 2L to keep saturations >90%. Home health PT, OT, and RN was ordered.   Patient was noted to have soft tissue mass of his right cheek which, per chart review, was present in 2015 and thought to be benign process without further workup.  Ultrasound was obtained, results below.  Was nontender and not acutely concerning.   At discharge, patient complained of constipation and O2 had not been delivered yet.  Patient was given prune juice and miralax overnight with bowel movement in the AM.  Care management confirmed O2 delivery and patient was discharged to home.  Issues for Follow Up:  1. Soft tissue mass of the cheek- consider further diagnostic imaging with CT/MR 2. History of Prostatic adenocarcinoma- PSA elevated (see results below), further outpatient workup warranted 3. Newly diagnosed HFpEF- reevaluate current medical regimen for management and prevention of worsening disease 4. T2DM- patient had baseline insulin requirements change inpatient (decreased levemir dose, decreased novolog doses), he was discharged on 20 levemir daily and no sliding scale.  This will likely need to be titrated up as he likely returns to his home eating habits. 5. Patient with bed bug infestation at time of admission, home health services will not come to house until proof of eradication is given 6. Patient was d//c'd with home O2 7. Patient's dry weight is likely 312-315, he was diuresed too far to 308 right before discharge and developed AKI.  He was on triamterene/HCTZ and furosemide 80 during admission and l  restarted both of these on discharge due to concern for returning with another CHF. However, this should be monitored closely given recent AKI and hypotension. This may need to be titrated up. 8. Stopped Metoprolol due to hypotension during admission. Recheck BP at consider whether or not to restart as outpatient. If restarting recommend 12.5mg  starting dose. 9. Restarting Flomax at his original home dose on discharge  Significant Procedures: None  Significant Labs and Imaging:  Recent Labs  Lab 05/28/18 0401 05/29/18 0306  WBC 8.7 8.1  HGB 11.1* 10.0*  HCT 35.0* 32.5*  PLT 312 296   Recent Labs  Lab 05/24/18 0037 05/27/18 0703 05/27/18 1103 05/28/18 0401 05/29/18 0306  NA 136  --  131* 135 136  K 4.0  --  4.3 3.9 3.9  CL 87*  --  81* 85* 93*  CO2 38*  --  36* 35* 36*  GLUCOSE 137*  --  194* 134* 89  BUN 14  --  38* 42* 39*  CREATININE 1.12 2.34* 2.33* 2.39* 1.97*  CALCIUM 8.7*  --  9.0 9.0 8.7*  MG 1.7  --   --   --   --    Prostatic Specific Antigen 0.00 - 4.00 ng/mL 12.94High     ECHOCARDIOGRAM (5/23): - Left ventricle: The cavity size was normal. There was mild   concentric hypertrophy. Systolic function was normal. The   estimated ejection fraction was in the range of 60% to 65%. Wall   motion was normal; there were no regional wall motion   abnormalities. Features are consistent with a pseudonormal left   ventricular filling pattern, with concomitant abnormal relaxation   and increased filling pressure (grade 2 diastolic dysfunction).   Doppler parameters are consistent with high ventricular filling   pressure. - Aortic valve: Valve mobility was restricted. - Mitral valve: Calcified annulus. Mild focal calcification of the   anterior leaflet. - Left atrium: The atrium was mildly dilated. - Right ventricle: The cavity size was mildly dilated. Wall   thickness was normal. Systolic function was mildly reduced. - Tricuspid valve: There was trivial  regurgitation. - Pulmonary arteries: Systolic pressure could not be accurately   estimated.   PORTABLE CHEST 1 VIEW (5/20) FINDINGS: Cardiomegaly with mild central congestion. Minimal atelectasis at the bases. No consolidation or effusion. No pneumothorax.  IMPRESSION: 1. Cardiomegaly with minimal central congestion. 2. Mild bibasilar atelectasis without focal airspace consolidation.   EXAM (5/20) CT ANGIOGRAPHY CHEST  CT ABDOMEN AND PELVIS WITH CONTRAST FINDINGS: CTA CHEST FINDINGS  Cardiovascular: Heart is enlarged. No pericardial effusion. Coronary artery calcification is evident. Atherosclerotic calcification is noted in the wall of the thoracic aorta. Prominence of the main pulmonary arteries suggests pulmonary arterial hypertension. No filling defects identified within the opacified pulmonary arteries.  Mediastinum/Nodes: No mediastinal lymphadenopathy. There is no hilar lymphadenopathy. The esophagus has normal imaging features. There is no axillary lymphadenopathy.  Lungs/Pleura: The central tracheobronchial airways are patent. Atelectasis or scarring noted in the posterior lower lobes bilaterally. No pulmonary edema or pleural effusion.  Musculoskeletal: Bone windows reveal no worrisome lytic or sclerotic osseous lesions. Bilateral gynecomastia evident.  Review of the MIP images confirms the above findings.  CT ABDOMEN and PELVIS FINDINGS  Hepatobiliary: No focal abnormality within the liver parenchyma. Calcified gallstones evident. No intrahepatic or extrahepatic biliary dilation.  Pancreas: No focal mass lesion. No dilatation of the main duct. No intraparenchymal cyst. No peripancreatic edema.  Spleen: No splenomegaly. No focal mass lesion.  Adrenals/Urinary Tract: No adrenal nodule or mass. Kidneys unremarkable. Proximal and mid ureters show no dilatation. Distal ureters obscured by bilateral hip replacement. Posterior bladder obscured  by streak artifact from posterior hip replacement.  Stomach/Bowel: Fundal diverticulum noted in the stomach. Stomach otherwise unremarkable. Duodenum is normally positioned as is the ligament of Treitz. Duodenal diverticulum evident. No small bowel wall thickening. No small bowel dilatation. The terminal ileum is normal. The appendix is not visualized, but there is no edema or inflammation in the region of the cecum. Diverticular changes are noted in the left colon. A portion of the sigmoid segment is obscured by streak artifact but no pericolonic edema or inflammation within the visualized portions of the colon.  Vascular/Lymphatic: There is abdominal aortic atherosclerosis without aneurysm. There is no gastrohepatic or hepatoduodenal ligament lymphadenopathy. No intraperitoneal or retroperitoneal lymphadenopathy. 12 mm short axis left external iliac lymph node is stable since prior study. Similar right external iliac node also unchanged. No groin lymphadenopathy.  Reproductive: Portions of the prostate gland obscured by streak artifact.  Other: No intraperitoneal free fluid.  Musculoskeletal: Small umbilical hernia contains only fat. Patient is status post bilateral hip replacement with stable appearance of cortical thickening and trabecular coarsening in the bony pelvis suggesting Paget's disease.  Review of the MIP images confirms the above findings.  IMPRESSION: 1. No CT evidence for acute pulmonary embolus. 2. Mild chronic atelectasis or scarring in the dependent lower lungs bilaterally. No other findings to explain the patient's history of shortness of breath. 3. Cholelithiasis. 4.  Aortic Atherosclerois (ICD10-170.0) 5. Gynecomastia.  EXAM: (5/20) ULTRASOUND OF HEAD/NECK SOFT TISSUES FINDINGS: Grayscale and color duplex ultrasound performed in the region clinical concern.  There is a homogeneously echo act soft tissue mass with lobulated borders  hypoechoic to the adjacent soft tissues. No significant internal flow.  IMPRESSION: Nonspecific soft tissue mass in the region of clinical concern. Malignancy not excluded, and further evaluation with either contrast-enhanced CT or contrast-enhanced MR recommended.   Results/Tests Pending at Time of Discharge: none  Discharge Medications:  Allergies as of 05/29/2018      Reactions   Ace Inhibitors Cough   With Lisinopril.   Angiotensin Receptor Blockers Other (See Comments)   Caused excessive weight gain   Metformin Diarrhea      Medication List    STOP taking these medications   furosemide 40 MG tablet Commonly known as:  LASIX   mupirocin ointment 2 % Commonly known as:  BACTROBAN   NOVOLOG FLEXPEN 100 UNIT/ML FlexPen Generic drug:  insulin aspart   silver sulfADIAZINE 1 % cream Commonly known as:  SILVADENE   triamterene-hydrochlorothiazide 37.5-25 MG tablet Commonly known as:  MAXZIDE-25     TAKE these medications   ammonium lactate 12 % lotion Commonly known as:  LAC-HYDRIN Apply topically every morning.   aspirin 81 MG chewable tablet Chew 81 mg by mouth every other day.   EASY TOUCH PEN NEEDLES 31G X 8 MM Misc Generic drug:  Insulin Pen Needle USE WITH FLEXPEN AS DIRECTED   esomeprazole 40 MG capsule Commonly known as:  NEXIUM TAKE 1 CAPSULE BY MOUTH EVERY DAY   FLOVENT HFA 220 MCG/ACT inhaler Generic drug:  fluticasone INHALE 1 PUFF INTO THE LUNGS TWICE DAILY   fluticasone 0.05 % cream Commonly known as:  CUTIVATE Apply 1 application topically 2 (two) times daily. Reported on 05/23/2016   Insulin Detemir 100 UNIT/ML Pen Commonly known as:  LEVEMIR FLEXTOUCH  Inject 20 Units into the skin daily. What changed:    how much to take  when to take this  Another medication with the same name was removed. Continue taking this medication, and follow the directions you see here.   latanoprost 0.005 % ophthalmic solution Commonly known as:   XALATAN INSTILL 1 DROP IN BOTH EYES EVERY EVENING FOR GLAUCOMA What changed:  Another medication with the same name was removed. Continue taking this medication, and follow the directions you see here.   metoprolol tartrate 100 MG tablet Commonly known as:  LOPRESSOR TAKE 1/2 TABLET BY MOUTH 2 TIMES DAILY.   mometasone 50 MCG/ACT nasal spray Commonly known as:  NASONEX INSTILL 2 SPRAYS INTO THE NOSE DAILY   nystatin powder Commonly known as:  MYCOSTATIN/NYSTOP Apply topically 2 (two) times daily.   ONE TOUCH DELICA LANCING DEV Misc 75 Units by Does not apply route 2 (two) times daily.   ONE TOUCH ULTRA TEST test strip Generic drug:  glucose blood USE TO CHECK BLOOD SUGAR FOUR TIMES A DAY   ONE TOUCH ULTRA TEST test strip Generic drug:  glucose blood USE TO TEST BLOOD SUGAR TWICE DAILY   ONETOUCH DELICA LANCETS 89F Misc USE TO CHECK BLOOD SUGAR TWICE DAILY.   pravastatin 40 MG tablet Commonly known as:  PRAVACHOL TAKE 1 TABLET BY MOUTH DAILY   PROAIR HFA 108 (90 Base) MCG/ACT inhaler Generic drug:  albuterol INHALE 2 PUFFS BY MOUTH EVERY 4 HOURS AS NEEDED FOR WHEEZING OR SHORTNESS OF BREATH   tamsulosin 0.4 MG Caps capsule Commonly known as:  FLOMAX TAKE 2 CAPSULES BY MOUTH EVERY MORNING   traMADol 50 MG tablet Commonly known as:  ULTRAM TAKE 1 TABLET BY MOUTH 2 TIMES A DAY AS NEEDED FOR PAIN What changed:  See the new instructions.   triamcinolone ointment 0.1 % Commonly known as:  KENALOG APPLY TO AFFECTED AREA TWICE DAILY AS NEEDED            Durable Medical Equipment  (From admission, onward)        Start     Ordered   05/26/18 1237  For home use only DME Walker rolling  Once    Question:  Patient needs a walker to treat with the following condition  Answer:  Ambulatory dysfunction   05/26/18 1236   05/26/18 1233  For home use only DME oxygen  Once    Comments:  O2 when ambulating  Question Answer Comment  Mode or (Route) Nasal cannula   Liters  per Minute 2   Frequency Continuous (stationary and portable oxygen unit needed)   Oxygen conserving device Yes   Oxygen delivery system Gas      05/26/18 1232   05/22/18 1511  For home use only DME oxygen  Once    Question:  Oxygen delivery system  Answer:  Gas   05/22/18 1510      Discharge Instructions: Please refer to Patient Instructions section of EMR for full details.  Patient was counseled important signs and symptoms that should prompt return to medical care, changes in medications, dietary instructions, activity restrictions, and follow up appointments.   Follow-Up Appointments: Follow-up Information    Tonette Bihari, MD. Go on 06/08/2018.   Specialty:  Family Medicine Why:  10:30am Contact information: [place] [address] [phone]        [facility], [place] Follow up.   Specialty:  Home Health Services Why:  Registered Nurse, Physical Therapy,  Aide, Social Work.  Contact information: [address] [place] [address] [phone]        Verner Mould, MD. Go on 06/02/2018.   Specialty:  Family Medicine Why:  1:50pm Contact information: [place] [address] [phone]           Harolyn Rutherford, DO PGY-1, [facility] 06/02/2018 7:55 PM

## 2018-05-27 NOTE — Progress Notes (Signed)
Family Medicine Teaching Service Daily Progress Note Intern Pager: [phone]  Patient name: [name] Medical record number: [id number] Date of birth: December 15, 1941 Age: 77 y.o. Gender: male  Primary Care Provider: Tonette Bihari, MD Consultants: none Code Status: FULL  Pt Overview and Major Events to Date:  5/20: patient admitted to teaching service 5/23: Echo notable for G2DD, EF 60-65%  Assessment and Plan: [name] is a 77yo M presenting with 3 day history of progressively worsening shortness of breath.  PMH significant for T2DM, BLE lymphedema, elephantiasis nostra verrucosa of RLE.   AKI: Cr 2.34 from baseline ~1, last labs were the 26th and they had been stable.  No urinary complaints. -will redose lovenox if staying -will discuss IVF -holding furosemide  Hypoxia: stable, needs O2 when not at rest Likely secondary to CHF exacerbation (newly diagnosed). Echo: grade 2 diastolic dysfunction, LVED 60-65%. Suspect contribution of OSA and OHSgiven morbid obesity, as well. Net 5.6 L negative. - continue PO Lasix 80 mg BID - Daily Weights - Strict I/O's - monitor BMP - will need supplemental O2 upon discharge  Lives alone with limited help: Patient wants home health - CSW consulted, appreciate recs [facility] consultation  Chest Pain, resolved: Patient had episode of chest pain with negative troponin x2. Has not recurred.  - aspirin 81mg  qhs  Elephantiasis nostra verrucosa  Bilateral LE Lymphedema: Has not seen wound clinic for the past three months.  Not on ABX.  - Diuresing as above - per wound care - continue to apply Ammonium lactate 12% lotion  Anemia of Chronic Disease: Stable. Unknown etiology, could be secondary to possible CHF.  T2DM: Ha1c is 9.3 on admission.  CBG this AM 137 - sensitive SSI - continue decreased dose of levemir 20 U daily  Pressure ulcer of upper posterior thigh, right, stage III - Wound care per Wound Care service  Monilla Intertrigo  of abdominal pannus and intergluteal cleft  - Nystatin powder bid  Prostatic Adenocarcinoma: Per patient, urologist is [facility], per their office his last visit was Dec 2017.  CT abdomen/pelvis negative for any obvious pathology concerning for metastatic cancer, however prostate was not entirely visualized.  This appears to not be concerning for acute process, recommend further outpatient work-up. PSA elevated at 12.94 on 5/21.  - Outpatient follow-up warranted - continue flomax  Right Cheek Mass: See prior notes.  Appears to be stable at this time, will recommend further outpatient workup.   Infestation by bed bug Appears to be resolved. Patient reports bed bugs "should be gone by now," although unclear if he actually had professional help come in. [facility] will investigate, appreciate assistance.   Chronic Medical Conditions managed on home medications: GERD: protonix HLD: pravastatin 40mg  Onychogryphosis of toes, R > L H/o Prostatic Adenocarcinoma: Flomax   FEN/GI: heart healthy, carb-modified diet PPx: Lovenox @ 0.5mg /kg  Disposition: To home once AKI treatment planned, home O2 will deliver today 1-5pm  Subjective: Patient requesting to go home  Objective: Temp:  [97.8 F (36.6 C)-98.7 F (37.1 C)] 97.8 F (36.6 C) (05/29 0735) Pulse Rate:  [72-97] 82 (05/29 0815) Resp:  [15-22] 22 (05/29 0735) BP: (84-122)/(48-71) 95/58 (05/29 0815) SpO2:  [93 %-96 %] 94 % (05/29 0735) Physical Exam:  Gen: obese elderly gentleman sitting up, NAD Heart: RRR, 2/6 murmur Lungs: CTAB, no IWB Abdomen: soft, no TTP Extremities: legs wrapped bilaterally with +2 pitting edema and chronic skin changes Neuro: no focal deficits  Laboratory: Recent Labs  Lab  05/21/18 0214 05/22/18 0532  WBC 7.9 6.7  HGB 9.6* 10.0*  HCT 32.8* 32.9*  PLT 311 299   Recent Labs  Lab 05/21/18 0214 05/22/18 0532 05/24/18 0037 05/27/18 0703  NA 142 139 136  --   K 3.6 4.2 4.0  --   CL 96* 91* 87*  --    CO2 36* 40* 38*  --   BUN 12 13 14   --   CREATININE 0.98 1.07 1.12 2.34*  CALCIUM 8.5* 8.4* 8.7*  --   GLUCOSE 108* 83 137*  --    ECHO: - Left ventricle: The cavity size was normal. There was mild   concentric hypertrophy. Systolic function was normal. The   estimated ejection fraction was in the range of 60% to 65%. Wall   motion was normal; there were no regional wall motion   abnormalities. Features are consistent with a pseudonormal left   ventricular filling pattern, with concomitant abnormal relaxation   and increased filling pressure (grade 2 diastolic dysfunction).   Doppler parameters are consistent with high ventricular filling   pressure. - Aortic valve: Valve mobility was restricted. - Mitral valve: Calcified annulus. Mild focal calcification of the   anterior leaflet. - Left atrium: The atrium was mildly dilated. - Right ventricle: The cavity size was mildly dilated. Wall   thickness was normal. Systolic function was mildly reduced. - Tricuspid valve: There was trivial regurgitation. - Pulmonary arteries: Systolic pressure could not be accurately   estimated.  Joel Sires, DO 05/27/2018, 9:28 AM PGY-1, [place] Intern pager: [phone], text pages welcome

## 2018-05-28 ENCOUNTER — Inpatient Hospital Stay: Payer: PPO | Admitting: Internal Medicine

## 2018-05-28 DIAGNOSIS — N179 Acute kidney failure, unspecified: Secondary | ICD-10-CM

## 2018-05-28 LAB — BASIC METABOLIC PANEL
Anion gap: 15 (ref 5–15)
BUN: 42 mg/dL — AB (ref 6–20)
CO2: 35 mmol/L — ABNORMAL HIGH (ref 22–32)
CREATININE: 2.39 mg/dL — AB (ref 0.61–1.24)
Calcium: 9 mg/dL (ref 8.9–10.3)
Chloride: 85 mmol/L — ABNORMAL LOW (ref 101–111)
GFR calc Af Amer: 28 mL/min — ABNORMAL LOW (ref >60)
GFR, EST NON AFRICAN AMERICAN: 24 mL/min — AB (ref >60)
Glucose, Bld: 134 mg/dL — ABNORMAL HIGH (ref 65–99)
Potassium: 3.9 mmol/L (ref 3.5–5.1)
Sodium: 135 mmol/L (ref 135–145)

## 2018-05-28 LAB — CBC
HCT: 35 % — ABNORMAL LOW (ref 39.0–52.0)
Hemoglobin: 11.1 g/dL — ABNORMAL LOW (ref 13.0–17.0)
MCH: 25.5 pg — ABNORMAL LOW (ref 26.0–34.0)
MCHC: 31.7 g/dL (ref 30.0–36.0)
MCV: 80.3 fL (ref 78.0–100.0)
PLATELETS: 312 10*3/uL (ref 150–400)
RBC: 4.36 MIL/uL (ref 4.22–5.81)
RDW: 14.1 % (ref 11.5–15.5)
WBC: 8.7 10*3/uL (ref 4.0–10.5)

## 2018-05-28 LAB — GLUCOSE, CAPILLARY
GLUCOSE-CAPILLARY: 153 mg/dL — AB (ref 65–99)
GLUCOSE-CAPILLARY: 157 mg/dL — AB (ref 65–99)
GLUCOSE-CAPILLARY: 143 mg/dL — AB (ref 65–99)
Glucose-Capillary: 141 mg/dL — ABNORMAL HIGH (ref 65–99)
Glucose-Capillary: 122 mg/dL — ABNORMAL HIGH (ref 65–99)

## 2018-05-28 MED ORDER — SODIUM CHLORIDE 0.9 % IV SOLN
INTRAVENOUS | Status: AC
  Administered 2018-05-28: 12:00:00 via INTRAVENOUS

## 2018-05-28 MED ORDER — ALUM & MAG HYDROXIDE-SIMETH 200-200-20 MG/5ML PO SUSP
30.0000 mL | Freq: Four times a day (QID) | ORAL | Status: DC | PRN
  Administered 2018-05-28: 30 mL via ORAL
  Filled 2018-05-28: qty 30

## 2018-05-28 MED ORDER — ENOXAPARIN SODIUM 30 MG/0.3ML ~~LOC~~ SOLN
30.0000 mg | SUBCUTANEOUS | Status: DC
  Administered 2018-05-28 – 2018-05-29 (×2): 30 mg via SUBCUTANEOUS
  Filled 2018-05-28 (×2): qty 0.3

## 2018-05-28 MED ORDER — SODIUM CHLORIDE 0.9 % IV BOLUS
500.0000 mL | Freq: Once | INTRAVENOUS | Status: AC
  Administered 2018-05-28: 500 mL via INTRAVENOUS

## 2018-05-28 NOTE — Progress Notes (Addendum)
MD on call notified of pt having a bp of 85/52 & a oral temp of 99.1. Not sure if pt is septic or not but RN is holding pt's night time dose of lopressor d/t low bp. Will continue to monitor the pt & await new orders from the MD. Hoover Brunette, RN   bp rechecked & was 87/56. New orders for a bolus for the pt. Hoover Brunette, RN

## 2018-05-28 NOTE — Care Management Note (Addendum)
Case Management Note  Patient Details  Name: [name] MRN: [id number] Date of Birth: 1941-01-08  Subjective/Objective:  Plan will be to transition home with [facility] with [facility]. Pt was discussed in the [place]. [facility] RN will draw a BMET on patient  Monday and call result to PCP.                  Action/Plan: Aide added to orders. Please see below for disciplines. CM to call [facility] fr transport home on 05-29-18. 02 has been delivered to home.   Expected Discharge Date:  05/26/18               Expected Discharge Plan:  [place]  In-House Referral:  [facility]  Discharge planning Services  CM Consult  Post Acute Care Choice:  Home Health, Durable Medical Equipment Choice offered to:  Patient  DME Arranged:  Oxygen DME Agency:  [place]:  RN, Disease Management, PT, OT, Refused SNF, Nurse's Aide [place] Agency:  [place]  Status of Service:  Completed, signed off  If discussed at [place] of Stay Meetings, dates discussed:    Additional Comments: 1436 06-02-18 Jacqlyn Krauss, RN,BSN [phone] CM did call [facility] for transport home. 8 pickups assigned in [place] before they can pick up the patient. No further needs from CM at this time.     1443 06-01-18 Jacqlyn Krauss, RN,BSN [phone] Pt continues to decline to be transitioned to SNF- Per MD plan will not be to transition home 06-01-18. MD states that pt was over diuresed and needs a bolus today. CM did ask MD to make sure documentation is present to reflect why pt is needed to be in the hospital. CM will assist with transportation to home via [facility].  Bethena Roys, RN 05/28/2018, 11:07 AM

## 2018-05-28 NOTE — Progress Notes (Signed)
Family Medicine Teaching Service Daily Progress Note Intern Pager: [phone]  Patient name: [name] Medical record number: [id number] Date of birth: [birth year] Age: 77 y.o. Gender: male  Primary Care Provider: Tonette Bihari, MD Consultants: none Code Status: FULL  Pt Overview and Major Events to Date:  5/20: patient admitted to teaching service 5/23: Echo notable for G2DD, EF 60-65% 5/29 AKI found, impeding discharge  Assessment and Plan: [name] is a 77yo M presenting with 3 day history of progressively worsening shortness of breath.  PMH significant for T2DM, BLE lymphedema, elephantiasis nostra verrucosa of RLE.   AKI: Cr 2.39 for the last two days from baseline ~1, last normal labs were the 26th and they had been stable.  No urinary complaints. S/p ~848mls of NS.  Patient socially has difficulty with followup -will redose lovenox if staying -will discuss further IVF -holding furosemide  Hypoxia: stable, needs O2 when not at rest Likely secondary to CHF exacerbation (newly diagnosed). Echo: grade 2 diastolic dysfunction, LVED 60-65%. Suspect contribution of OSA and OHSgiven morbid obesity, as well. Net 5.6 L negative. - continue PO Lasix 80 mg BID - Daily Weights - Strict I/O's - monitor BMP - has home O2 delivered  Lives alone with limited help: Home health ordered  Chest Pain, resolved: Patient had episode of chest pain with negative troponin x2. Has not recurred.  - aspirin 81mg  qhs  Elephantiasis nostra verrucosa  Bilateral LE Lymphedema: stable. Has not seen wound clinic for the past three months.  Not on ABX.  - per wound care - continue to apply Ammonium lactate 12% lotion  Anemia of Chronic Disease: Stable. Unknown etiology, could be secondary to possible CHF.  T2DM: Ha1c is 9.3 on admission.  CBG this AM 137 - sensitive SSI - continue decreased dose of levemir 20 U daily  Pressure ulcer of upper posterior thigh, right, stage III - Wound  care per Wound Care service  Monilla Intertrigo of abdominal pannus and intergluteal cleft  - Nystatin powder bid  Prostatic Adenocarcinoma: Per patient, urologist is [facility], per their office his last visit was Dec 2017.  CT abdomen/pelvis negative for any obvious pathology concerning for metastatic cancer, however prostate was not entirely visualized.  This appears to not be concerning for acute process, recommend further outpatient work-up. PSA elevated at 12.94 on 5/21.  - Outpatient follow-up warranted - continue flomax  Right Cheek Mass: See prior notes.  Appears to be stable at this time, will recommend further outpatient workup.   Infestation by bed bug Appears to be resolved. Patient reports bed bugs "should be gone by now," although unclear if he actually had professional help come in. [facility] will investigate, appreciate assistance.   Chronic Medical Conditions managed on home medications: GERD: protonix HLD: pravastatin 40mg  Onychogryphosis of toes, R > L H/o Prostatic Adenocarcinoma: Flomax   FEN/GI: heart healthy, carb-modified diet PPx: reduced dose lovenox  Disposition: To home once AKI improving  Subjective: Comfortable with staying until medical team clears him, no urinary/abdominal complaints  Objective: Temp:  [97.8 F (36.6 C)-98.6 F (37 C)] 98.6 F (37 C) (05/30 0408) Pulse Rate:  [65-82] 73 (05/30 0408) Resp:  [15-22] 17 (05/30 0408) BP: (84-128)/(55-90) 128/62 (05/30 0408) SpO2:  [92 %-98 %] 95 % (05/30 0408) Weight:  [311 lb 1.1 oz (141.1 kg)] 311 lb 1.1 oz (141.1 kg) (05/30 0408) Physical Exam:  Gen: obese, NAD Heart: RRR, 2/6 mumur Lungs: CTAB, no IWB Abdomen: soft, no TTP Extremities: legs  wrapped, only 1+ edema at knee Neuro: no CN deficits noted  Laboratory: Recent Labs  Lab 05/22/18 0532 05/28/18 0401  WBC 6.7 8.7  HGB 10.0* 11.1*  HCT 32.9* 35.0*  PLT 299 312   Recent Labs  Lab 05/24/18 0037 05/27/18 0703 05/27/18 1103  05/28/18 0401  NA 136  --  131* 135  K 4.0  --  4.3 3.9  CL 87*  --  81* 85*  CO2 38*  --  36* 35*  BUN 14  --  38* 42*  CREATININE 1.12 2.34* 2.33* 2.39*  CALCIUM 8.7*  --  9.0 9.0  GLUCOSE 137*  --  194* 134*   ECHO: - Left ventricle: The cavity size was normal. There was mild   concentric hypertrophy. Systolic function was normal. The   estimated ejection fraction was in the range of 60% to 65%. Wall   motion was normal; there were no regional wall motion   abnormalities. Features are consistent with a pseudonormal left   ventricular filling pattern, with concomitant abnormal relaxation   and increased filling pressure (grade 2 diastolic dysfunction).   Doppler parameters are consistent with high ventricular filling   pressure. - Aortic valve: Valve mobility was restricted. - Mitral valve: Calcified annulus. Mild focal calcification of the   anterior leaflet. - Left atrium: The atrium was mildly dilated. - Right ventricle: The cavity size was mildly dilated. Wall   thickness was normal. Systolic function was mildly reduced. - Tricuspid valve: There was trivial regurgitation. - Pulmonary arteries: Systolic pressure could not be accurately   estimated.  Joel Sires, DO 05/28/2018, 6:53 AM PGY-1, [place] Intern pager: [phone], text pages welcome

## 2018-05-28 NOTE — Progress Notes (Signed)
Physical Therapy Treatment Patient Details Name: [name] MRN: [id number] DOB: 1941/06/15 Today's Date: 05/28/2018    History of Present Illness Waymon Laser is a 77yo M presenting with 3 day history of progressively worsening shortness of breath.  PMH significant for T2DM, BLE lymphedema, elephantiasis nostra verrucosa of RLE.     PT Comments    Pt admitted with above diagnosis. Pt currently with functional limitations due to the deficits listed below (see PT Problem List). Pt was able to come to eOB with supervision only.  Pt refused to get OOB as he said the recliner is uncomfortable.  Pt states he will manage fine at home.  PErformed some exercises.  Will follow acutely.  Pt will benefit from skilled PT to increase their independence and safety with mobility to allow discharge to the venue listed below.     Follow Up Recommendations  SNF     Equipment Recommendations  Rolling walker with 5" wheels(bariatric)    Recommendations for Other Services       Precautions / Restrictions Precautions Precautions: Fall Restrictions Weight Bearing Restrictions: No    Mobility  Bed Mobility Overal bed mobility: Needs Assistance Bed Mobility: Supine to Sit;Sit to Supine Rolling: Supervision Sidelying to sit: Supervision;HOB elevated Supine to sit: Supervision;HOB elevated;Min guard     General bed mobility comments: Pt was able to come to EOB with use of rails without assist.  Pt has hospital bed at home.  Once at EOB, pt wanted to sit and eat his lunch.  He refused to get into recliner as he said it is uncomfortable.  LEft pt sitting eOB and he will call when he wants to lie down.  Sats >90% on  RA.   Transfers                    Ambulation/Gait                 Stairs             Wheelchair Mobility    Modified Rankin (Stroke Patients Only)       Balance Overall balance assessment: Needs assistance Sitting-balance support: Bilateral upper  extremity supported;Feet supported Sitting balance-Leahy Scale: Fair                                      Cognition Arousal/Alertness: Awake/alert Behavior During Therapy: WFL for tasks assessed/performed Overall Cognitive Status: Within Functional Limits for tasks assessed Area of Impairment: Safety/judgement                         Safety/Judgement: Decreased awareness of safety;Decreased awareness of deficits     General Comments: pt requires extensive period of time to consider therapist's requests      Exercises General Exercises - Lower Extremity Ankle Circles/Pumps: AROM;Both;10 reps;Seated Long Arc Quad: AROM;Both;10 reps;Supine Heel Slides: AROM;Both;10 reps;Supine    General Comments        Pertinent Vitals/Pain Pain Assessment: No/denies pain    Home Living                      Prior Function            PT Goals (current goals can now be found in the care plan section) Acute Rehab PT Goals Patient Stated Goal: to return home Progress towards PT goals: Progressing toward goals  Frequency    Min 3X/week      PT Plan Current plan remains appropriate    Co-evaluation              AM-PAC PT "6 Clicks" Daily Activity  Outcome Measure  Difficulty turning over in bed (including adjusting bedclothes, sheets and blankets)?: A Little Difficulty moving from lying on back to sitting on the side of the bed? : A Little Difficulty sitting down on and standing up from a chair with arms (e.g., wheelchair, bedside commode, etc,.)?: A Little Help needed moving to and from a bed to chair (including a wheelchair)?: A Little Help needed walking in hospital room?: Total Help needed climbing 3-5 steps with a railing? : Total 6 Click Score: 14    End of Session Equipment Utilized During Treatment: Gait belt;Oxygen Activity Tolerance: Patient tolerated treatment well Patient left: with call bell/phone within reach;in  bed(sitting EOB) Nurse Communication: Mobility status PT Visit Diagnosis: Muscle weakness (generalized) (M62.81);Difficulty in walking, not elsewhere classified (R26.2)     Time: 0488-8916 PT Time Calculation (min) (ACUTE ONLY): 17 min  Charges:  $Therapeutic Activity: 8-22 mins                    G Codes:       Richie Bonanno,PT Acute Rehabilitation [phone] (pager)    Denice Paradise 05/28/2018, 2:32 PM

## 2018-05-29 LAB — GLUCOSE, CAPILLARY
GLUCOSE-CAPILLARY: 177 mg/dL — AB (ref 65–99)
GLUCOSE-CAPILLARY: 91 mg/dL (ref 65–99)
GLUCOSE-CAPILLARY: 99 mg/dL (ref 65–99)
Glucose-Capillary: 104 mg/dL — ABNORMAL HIGH (ref 65–99)
Glucose-Capillary: 143 mg/dL — ABNORMAL HIGH (ref 65–99)
Glucose-Capillary: 155 mg/dL — ABNORMAL HIGH (ref 65–99)

## 2018-05-29 LAB — CBC
HCT: 32.5 % — ABNORMAL LOW (ref 39.0–52.0)
Hemoglobin: 10 g/dL — ABNORMAL LOW (ref 13.0–17.0)
MCH: 25.3 pg — ABNORMAL LOW (ref 26.0–34.0)
MCHC: 30.8 g/dL (ref 30.0–36.0)
MCV: 82.1 fL (ref 78.0–100.0)
Platelets: 296 10*3/uL (ref 150–400)
RBC: 3.96 MIL/uL — ABNORMAL LOW (ref 4.22–5.81)
RDW: 14.3 % (ref 11.5–15.5)
WBC: 8.1 10*3/uL (ref 4.0–10.5)

## 2018-05-29 LAB — BASIC METABOLIC PANEL
Anion gap: 7 (ref 5–15)
BUN: 39 mg/dL — AB (ref 6–20)
CO2: 36 mmol/L — ABNORMAL HIGH (ref 22–32)
Calcium: 8.7 mg/dL — ABNORMAL LOW (ref 8.9–10.3)
Chloride: 93 mmol/L — ABNORMAL LOW (ref 101–111)
Creatinine, Ser: 1.97 mg/dL — ABNORMAL HIGH (ref 0.61–1.24)
GFR calc Af Amer: 36 mL/min — ABNORMAL LOW (ref >60)
GFR, EST NON AFRICAN AMERICAN: 31 mL/min — AB (ref >60)
GLUCOSE: 89 mg/dL (ref 65–99)
Potassium: 3.9 mmol/L (ref 3.5–5.1)
Sodium: 136 mmol/L (ref 135–145)

## 2018-05-29 MED ORDER — ENOXAPARIN SODIUM 40 MG/0.4ML ~~LOC~~ SOLN
40.0000 mg | SUBCUTANEOUS | Status: DC
  Administered 2018-05-30 – 2018-06-02 (×4): 40 mg via SUBCUTANEOUS
  Filled 2018-05-29 (×4): qty 0.4

## 2018-05-29 NOTE — Care Management Important Message (Signed)
Important Message  Patient Details  Name: [name] MRN: [id number] Date of Birth: Mar 02, 1941   Medicare Important Message Given:  Yes    Bethena Roys, RN 05/29/2018, 4:27 PM

## 2018-05-29 NOTE — Progress Notes (Signed)
Family Medicine Teaching Service Daily Progress Note Intern Pager: [phone]  Patient name: [name] Medical record number: [id number] Date of birth: 1941/07/10 Age: 77 y.o. Gender: male  Primary Care Provider: Tonette Bihari, MD Consultants: none Code Status: FULL  Pt Overview and Major Events to Date:  5/20: patient admitted to teaching service 5/23: Echo notable for G2DD, EF 60-65% 5/29 AKI found, impeding discharge  Assessment and Plan: [name] is a 77yo M presenting with 3 day history of progressively worsening shortness of breath.  PMH significant for T2DM, BLE lymphedema, elephantiasis nostra verrucosa of RLE.   AKI: Cr 2.39 for the last two days from baseline ~1, last normal labs were the 26th and they had been stable.  No urinary complaints. S/p ~843mls of NS.  Patient socially has difficulty with followup -po intake -holding furosemide -dry weight likely 312-315, diuresed down from 340  Hypoxia: stable, needs O2 when not at rest Likely secondary to CHF exacerbation (newly diagnosed). Echo: grade 2 diastolic dysfunction, LVED 60-65%. Suspect contribution of OSA and OHSgiven morbid obesity, as well.  - no lasix - Daily Weights (dry weight 312-315 likely) - Strict I/O's - monitor BMP - has home O2 delivered  Lives alone with limited help: Home health ordered  Elephantiasis nostra verrucosa  Bilateral LE Lymphedema: stable. Has not seen wound clinic for the past three months.  Not on ABX.  - per wound care - continue to apply Ammonium lactate 12% lotion  Anemia of Chronic Disease: Stable. Unknown etiology, could be secondary to possible CHF.  T2DM: Ha1c is 9.3 on admission.  CBG this AM 137 - sensitive SSI - continue decreased dose of levemir 20 U daily, will be discharge dose and can be titrated by pcp  Pressure ulcer of upper posterior thigh, right, stage III - Wound care per Wound Care service  Monilla Intertrigo of abdominal pannus and  intergluteal cleft  - Nystatin powder bid  Prostatic Adenocarcinoma: Per patient, urologist is [facility], per their office his last visit was Dec 2017.  CT abdomen/pelvis negative for any obvious pathology concerning for metastatic cancer, however prostate was not entirely visualized.  This appears to not be concerning for acute process, recommend further outpatient work-up. PSA elevated at 12.94 on 5/21.  - Outpatient follow-up warranted - continue flomax  Right Cheek Mass: See prior notes.  Appears to be stable at this time, will recommend further outpatient workup.   Infestation by bed bug Appears to be resolved. Patient reports bed bugs "should be gone by now," although unclear if he actually had professional help come in. [facility] will investigate, appreciate assistance.   Chronic Medical Conditions managed on home medications: GERD: protonix HLD: pravastatin 40mg  Onychogryphosis of toes, R > L H/o Prostatic Adenocarcinoma: Flomax   FEN/GI: heart healthy, carb-modified diet PPx: reduced dose lovenox  Disposition: To home likely 6/1  Subjective: Comfortable with plan of additional day  Objective: Temp:  [97.9 F (36.6 C)-99.1 F (37.3 C)] 97.9 F (36.6 C) (05/31 1142) Pulse Rate:  [65-78] 67 (05/31 1142) Resp:  [18] 18 (05/30 2031) BP: (85-125)/(53-64) 100/59 (05/31 1142) SpO2:  [90 %-96 %] 94 % (05/31 1142) Physical Exam:  Gen: obese, NAD Heart: RRR, 2/6 muumur, no pitting edema Lungs: CTAB, no IWB, no wheezes/crackles Abdomen: soft, no TTP Extremities: legs wrapped, no pitting edema Neuro: CN grossly intact Psych: AOx3, appropriate thought but somewhat confused by medications  Laboratory: Recent Labs  Lab 05/28/18 0401 05/29/18 0306  WBC 8.7 8.1  HGB 11.1* 10.0*  HCT 35.0* 32.5*  PLT 312 296   Recent Labs  Lab 05/27/18 1103 05/28/18 0401 05/29/18 0306  NA 131* 135 136  K 4.3 3.9 3.9  CL 81* 85* 93*  CO2 36* 35* 36*  BUN 38* 42* 39*  CREATININE  2.33* 2.39* 1.97*  CALCIUM 9.0 9.0 8.7*  GLUCOSE 194* 134* 89   ECHO: - Left ventricle: The cavity size was normal. There was mild   concentric hypertrophy. Systolic function was normal. The   estimated ejection fraction was in the range of 60% to 65%. Wall   motion was normal; there were no regional wall motion   abnormalities. Features are consistent with a pseudonormal left   ventricular filling pattern, with concomitant abnormal relaxation   and increased filling pressure (grade 2 diastolic dysfunction).   Doppler parameters are consistent with high ventricular filling   pressure. - Aortic valve: Valve mobility was restricted. - Mitral valve: Calcified annulus. Mild focal calcification of the   anterior leaflet. - Left atrium: The atrium was mildly dilated. - Right ventricle: The cavity size was mildly dilated. Wall   thickness was normal. Systolic function was mildly reduced. - Tricuspid valve: There was trivial regurgitation. - Pulmonary arteries: Systolic pressure could not be accurately   estimated.  Sherene Sires, DO 05/29/2018, 2:19 PM PGY-1, [place] Intern pager: [phone], text pages welcome

## 2018-05-30 DIAGNOSIS — I959 Hypotension, unspecified: Secondary | ICD-10-CM

## 2018-05-30 LAB — GLUCOSE, CAPILLARY
GLUCOSE-CAPILLARY: 152 mg/dL — AB (ref 65–99)
GLUCOSE-CAPILLARY: 155 mg/dL — AB (ref 65–99)
GLUCOSE-CAPILLARY: 152 mg/dL — AB (ref 65–99)
Glucose-Capillary: 142 mg/dL — ABNORMAL HIGH (ref 65–99)
Glucose-Capillary: 146 mg/dL — ABNORMAL HIGH (ref 65–99)
Glucose-Capillary: 195 mg/dL — ABNORMAL HIGH (ref 65–99)

## 2018-05-30 LAB — BASIC METABOLIC PANEL
Anion gap: 10 (ref 5–15)
BUN: 34 mg/dL — AB (ref 6–20)
CHLORIDE: 94 mmol/L — AB (ref 101–111)
CO2: 31 mmol/L (ref 22–32)
CREATININE: 1.39 mg/dL — AB (ref 0.61–1.24)
Calcium: 9.6 mg/dL (ref 8.9–10.3)
GFR calc Af Amer: 54 mL/min — ABNORMAL LOW (ref >60)
GFR calc non Af Amer: 47 mL/min — ABNORMAL LOW (ref >60)
GLUCOSE: 157 mg/dL — AB (ref 65–99)
POTASSIUM: 3.9 mmol/L (ref 3.5–5.1)
SODIUM: 135 mmol/L (ref 135–145)

## 2018-05-30 LAB — CBC
HEMATOCRIT: 35.3 % — AB (ref 39.0–52.0)
Hemoglobin: 10.9 g/dL — ABNORMAL LOW (ref 13.0–17.0)
MCH: 25.1 pg — ABNORMAL LOW (ref 26.0–34.0)
MCHC: 30.9 g/dL (ref 30.0–36.0)
MCV: 81.1 fL (ref 78.0–100.0)
PLATELETS: 309 10*3/uL (ref 150–400)
RBC: 4.35 MIL/uL (ref 4.22–5.81)
RDW: 14.2 % (ref 11.5–15.5)
WBC: 7 10*3/uL (ref 4.0–10.5)

## 2018-05-30 MED ORDER — METOPROLOL TARTRATE 25 MG PO TABS
25.0000 mg | ORAL_TABLET | Freq: Two times a day (BID) | ORAL | Status: DC
  Filled 2018-05-30: qty 1

## 2018-05-30 MED ORDER — INSULIN ASPART 100 UNIT/ML ~~LOC~~ SOLN
0.0000 [IU] | Freq: Three times a day (TID) | SUBCUTANEOUS | Status: DC
  Administered 2018-05-30 – 2018-05-31 (×3): 2 [IU] via SUBCUTANEOUS
  Administered 2018-05-31 (×2): 1 [IU] via SUBCUTANEOUS
  Administered 2018-06-01 (×3): 2 [IU] via SUBCUTANEOUS
  Administered 2018-06-02: 1 [IU] via SUBCUTANEOUS
  Administered 2018-06-02: 2 [IU] via SUBCUTANEOUS

## 2018-05-30 MED ORDER — INSULIN ASPART 100 UNIT/ML ~~LOC~~ SOLN: 0.0000 [IU] | Freq: Three times a day (TID) | SUBCUTANEOUS | Status: DC

## 2018-05-30 MED ORDER — INSULIN ASPART 100 UNIT/ML ~~LOC~~ SOLN: 0.0000 [IU] | Freq: Every day | SUBCUTANEOUS | Status: DC

## 2018-05-30 NOTE — Progress Notes (Signed)
Pt states he wanted to have something to help him have a bowel movement. Md on call paged who stated pt has a SMOG enema ordered. Pt states he will try prune juice and will try to get up in a little bit to try and have a bowel movement. Pt reminded he is getting daily miralax. Will cont to monitor pt.

## 2018-05-30 NOTE — Progress Notes (Addendum)
Family Medicine Teaching Service Daily Progress Note Intern Pager: [phone]  Patient name: CARYL MANAS Medical record number: [id number] Date of birth: 06/07/1941 Age: 77 y.o. Gender: male  Primary Care Provider: Tonette Bihari, MD Consultants: none Code Status: FULL  Pt Overview and Major Events to Date:  5/20: patient admitted to teaching service 5/23: Echo notable for G2DD, EF 60-65% 5/29 AKI found, impeding discharge 6/1: AKI resolved but has hypotension  Assessment and Plan: Bradd Merlos is a 77yo M presenting with 3 day history of progressively worsening shortness of breath.  PMH significant for T2DM, BLE lymphedema, elephantiasis nostra verrucosa of RLE.   AKI: Improving. Cr 2.39>1.97>1.39. Baseline Cr~1.  Likely prerenal etiology secondary to overdiuresis with Lasix for presumed CHF exacerbation. -Daily BMP -Holding furosemide and triamterene/hydrochlorothiazide -Hold off on IV fluids for now as he has had adequate p.o. intake -Patient can likely go home on June 2 if creatinine continues to improve  Hypotension: Likely secondary to over diuresis vs overtreating hypertension.  MAPs in the low 60s. -Vitals per floor protocol -We will decrease metoprolol from 50 mg twice daily to 25 mg twice daily -Continue to hold diuretics and antihypertensives as above  HFpEF with hypoxia: 5/23 Echo: grade 2 diastolic dysfunction, LVED 60-65%.  Weight 315 today.  Dry weight likely 312-315, diuresed down from 340 on admission. No hypoxia as of now, on room air. Suspect contribution of OSA and OHSgiven morbid obesity, as well.  -Holding diuresis as above - Daily Weights (dry weight 312-315 likely) - Strict I/O's - monitor BMP -Metoprolol as above - has home O2 delivered  T2DM: Hga1c is 9.3 on admission.  Home regimen is Levemir 48 units BID. Glucoses 140-150 at decreased Levemir dose. 3 units of SSI required over last 24 hours. - CBGS with meals and before bed - sensitive SSI -  Levemir 20 U daily, will be discharge dose and can be titrated by pcp  Lives alone with limited help: Recommended SNF placement, patient declined - Home health ordered  Elephantiasis nostra verrucosa  Bilateral LE Lymphedema: stable. Has not seen wound clinic for the past three months.  Not on ABX.  - per wound care - continue to apply Ammonium lactate 12% lotion  Anemia of Chronic Disease: Stable. Unknown etiology, could be secondary to possible CHF.  Pressure ulcer of upper posterior thigh, right, stage III - Wound care per Wound Care service  Monilla Intertrigo of abdominal pannus and intergluteal cleft  - Nystatin powder bid  Prostatic Adenocarcinoma: Per patient, urologist is [facility], per their office his last visit was Dec 2017.  CT abdomen/pelvis negative for any obvious pathology concerning for metastatic cancer, however prostate was not entirely visualized.  This appears to not be concerning for acute process, recommend further outpatient work-up. PSA elevated at 12.94 on 5/21.  - Outpatient follow-up warranted - continue flomax  Right Cheek Mass: See prior notes.  Appears to be stable at this time, will recommend further outpatient workup.   Infestation by bed bug Appears to be resolved. Patient reports bed bugs "should be gone by now," although unclear if he actually had professional help come in. [facility] will investigate, appreciate assistance.   Chronic Medical Conditions managed on home medications: GERD: protonix HLD: pravastatin 40mg  Onychogryphosis of toes, R > L H/o Prostatic Adenocarcinoma: Flomax   FEN/GI: heart healthy, carb-modified diet PPx: reduced dose lovenox  Disposition: Continue to monitor inpatient for hypotension and AKI  Subjective:  Patient notes that he feels well but  he is a little constipated.  His last bowel movement was 2 days ago and he feels like he needs to have another one.  Otherwise he is eating and drinking normally and has no  pain.  Objective: Temp:  [97.9 F (36.6 C)-98.9 F (37.2 C)] 98.9 F (37.2 C) (06/01 0752) Pulse Rate:  [62-81] 72 (06/01 0752) Resp:  [16] 16 (05/31 2014) BP: (85-125)/(50-65) 99/55 (06/01 0752) SpO2:  [91 %-100 %] 91 % (06/01 0752) Weight:  [315 lb 4.1 oz (143 kg)] 315 lb 4.1 oz (143 kg) (06/01 0500) Physical Exam:  Gen: obese, NAD, alert and oriented Heart: Regular rate and rhythm, normal S1-S2, no edema on ankles Lungs: Difficult secondary to body habitus although normal work of breathing clear to auscultation bilaterally Abdomen: soft, no TTP Extremities: legs wrapped, no pitting edema Psych: Normal mood and affect  Laboratory: Recent Labs  Lab 05/28/18 0401 05/29/18 0306 05/30/18 0707  WBC 8.7 8.1 7.0  HGB 11.1* 10.0* 10.9*  HCT 35.0* 32.5* 35.3*  PLT 312 296 309   Recent Labs  Lab 05/28/18 0401 05/29/18 0306 05/30/18 0707  NA 135 136 135  K 3.9 3.9 3.9  CL 85* 93* 94*  CO2 35* 36* 31  BUN 42* 39* 34*  CREATININE 2.39* 1.97* 1.39*  CALCIUM 9.0 8.7* 9.6  GLUCOSE 134* 89 157*   ECHO: - Left ventricle: The cavity size was normal. There was mild   concentric hypertrophy. Systolic function was normal. The   estimated ejection fraction was in the range of 60% to 65%. Wall   motion was normal; there were no regional wall motion   abnormalities. Features are consistent with a pseudonormal left   ventricular filling pattern, with concomitant abnormal relaxation   and increased filling pressure (grade 2 diastolic dysfunction).   Doppler parameters are consistent with high ventricular filling   pressure. - Aortic valve: Valve mobility was restricted. - Mitral valve: Calcified annulus. Mild focal calcification of the   anterior leaflet. - Left atrium: The atrium was mildly dilated. - Right ventricle: The cavity size was mildly dilated. Wall   thickness was normal. Systolic function was mildly reduced. - Tricuspid valve: There was trivial regurgitation. -  Pulmonary arteries: Systolic pressure could not be accurately   estimated.  Carlyle Dolly, MD 05/30/2018, 9:23 AM PGY-3, [place] Intern pager: [phone], text pages welcome

## 2018-05-30 NOTE — Progress Notes (Signed)
Spoke with pt who stated his oxygen equipment was delivered to his house earlier this week.

## 2018-05-30 NOTE — Progress Notes (Addendum)
NCM notified discharge coordinator Jermaine to check with RN to see if someone is at hospital that can take items with them prior to discharge- that looks like patient will transported by [facility] at discharge and if oxygen is required or DME equipment previously requested will need to be delivered to home; [facility] cannot transport tank or DME equipment for patient.

## 2018-05-31 LAB — BASIC METABOLIC PANEL
ANION GAP: 10 (ref 5–15)
BUN: 30 mg/dL — ABNORMAL HIGH (ref 6–20)
CALCIUM: 9.7 mg/dL (ref 8.9–10.3)
CO2: 31 mmol/L (ref 22–32)
Chloride: 95 mmol/L — ABNORMAL LOW (ref 101–111)
Creatinine, Ser: 1.36 mg/dL — ABNORMAL HIGH (ref 0.61–1.24)
GFR, EST AFRICAN AMERICAN: 56 mL/min — AB (ref >60)
GFR, EST NON AFRICAN AMERICAN: 48 mL/min — AB (ref >60)
GLUCOSE: 175 mg/dL — AB (ref 65–99)
POTASSIUM: 4.2 mmol/L (ref 3.5–5.1)
Sodium: 136 mmol/L (ref 135–145)

## 2018-05-31 LAB — CBC
HCT: 36.9 % — ABNORMAL LOW (ref 39.0–52.0)
HEMOGLOBIN: 11.4 g/dL — AB (ref 13.0–17.0)
MCH: 25.6 pg — ABNORMAL LOW (ref 26.0–34.0)
MCHC: 30.9 g/dL (ref 30.0–36.0)
MCV: 82.9 fL (ref 78.0–100.0)
Platelets: 302 10*3/uL (ref 150–400)
RBC: 4.45 MIL/uL (ref 4.22–5.81)
RDW: 14.4 % (ref 11.5–15.5)
WBC: 8.1 10*3/uL (ref 4.0–10.5)

## 2018-05-31 LAB — GLUCOSE, CAPILLARY
GLUCOSE-CAPILLARY: 148 mg/dL — AB (ref 65–99)
GLUCOSE-CAPILLARY: 151 mg/dL — AB (ref 65–99)
GLUCOSE-CAPILLARY: 144 mg/dL — AB (ref 65–99)
GLUCOSE-CAPILLARY: 145 mg/dL — AB (ref 65–99)

## 2018-05-31 MED ORDER — METOPROLOL TARTRATE 12.5 MG HALF TABLET
12.5000 mg | ORAL_TABLET | Freq: Two times a day (BID) | ORAL | Status: DC
  Administered 2018-05-31 – 2018-06-02 (×4): 12.5 mg via ORAL
  Filled 2018-05-31 (×5): qty 1

## 2018-05-31 NOTE — Progress Notes (Signed)
Dr Gwyndolyn Saxon notified of BP 105/62 and pulse of 68. Ok to hold Lopressor tonight due to low BP

## 2018-05-31 NOTE — Progress Notes (Signed)
Attempted to get pt up to bsc, pt unable to get up with assistance. Pt kept sliding. Pt stated he got up last night twice to have a bowel movement with minimal assistance. Md on call made aware. 02 sats dropped down to the mid 80's on room air. Pt denied any SOB. Pt's 02 sat currently low 90's on room air. Pt resting in bed. Will cont to monitor pt.

## 2018-05-31 NOTE — Progress Notes (Addendum)
Family Medicine Teaching Service Daily Progress Note Intern Pager: [phone]  Patient name: [name] Medical record number: [id number] Date of birth: 1941/12/19 Age: 77 y.o. Gender: male  Primary Care Provider: Tonette Bihari, MD Consultants: none Code Status: FULL  Pt Overview and Major Events to Date:  5/20: patient admitted to teaching service 5/23: Echo notable for G2DD, EF 60-65% 5/29 AKI found, impeding discharge 6/1: AKI resolved but has hypotension  Assessment and Plan: [name] is a 77yo M presenting with 3 day history of progressively worsening shortness of breath.  PMH significant for T2DM, BLE lymphedema, elephantiasis nostra verrucosa of RLE.   AKI: Improving. Cr 2.39>1.97>1.39 >1.36. Baseline Cr~1.  Likely prerenal etiology secondary to overdiuresis with Lasix for presumed CHF exacerbation. -Daily BMP -Holding furosemide and triamterene/hydrochlorothiazide -Hold off on IV fluids for now as he has had adequate p.o. intake  Hypotension, improving: Likely secondary to over diuresis vs overtreating hypertension. Blood pressure improved to 11/67 this morning, but evening metoprolol was held.  -Vitals per floor protocol -We will decrease metoprolol from 25 mg twice daily to 12.5 mg twice daily -Continue to hold diuretics and antihypertensives as above - since PT last saw patient on 5/30, will ambulate him with pulse ox to ensure patient does not have lightheadedness.   HFpEF with hypoxia: 5/23 Echo: grade 2 diastolic dysfunction, LVED 60-65%.  Weight 310 today.  Dry weight likely 312-315, diuresed down from 340 on admission. No hypoxia as of now, on room air. Suspect contribution of OSA and OHSgiven morbid obesity, as well.  - Holding diuresis as above - Daily Weights (dry weight 312-315 likely) - Strict I/O's - monitor BMP - Metoprolol as above - has home O2 delivered (of note, he is on room air here. Will ambulate with pulse ox)   T2DM: Hga1c is 9.3 on  admission.  Home regimen is Levemir 48 units BID.  4 units of SSI required over last 24 hours. - CBGS with meals and before bed - sensitive SSI - Levemir 20 U daily, will be discharge dose and can be titrated by pcp  Lives alone with limited help: Recommended SNF placement, patient declined - Home health ordered  Elephantiasis nostra verrucosa  Bilateral LE Lymphedema: stable. Has not seen wound clinic for the past three months.  Not on ABX.  - per wound care - continue to apply Ammonium lactate 12% lotion  Anemia of Chronic Disease: Stable. Unknown etiology, could be secondary to possible CHF.  Pressure ulcer of upper posterior thigh, right, stage III - Wound care per Wound Care service  Monilla Intertrigo of abdominal pannus and intergluteal cleft  - Nystatin powder bid  Prostatic Adenocarcinoma: Per patient, urologist is [facility], per their office his last visit was Dec 2017.  CT abdomen/pelvis negative for any obvious pathology concerning for metastatic cancer, however prostate was not entirely visualized.  This appears to not be concerning for acute process, recommend further outpatient work-up. PSA elevated at 12.94 on 5/21.  - Outpatient follow-up warranted - continue flomax  Right Cheek Mass: See prior notes.  Appears to be stable at this time, will recommend further outpatient workup.   Infestation by bed bug Appears to be resolved. Patient reports bed bugs "should be gone by now," although unclear if he actually had professional help come in. [facility] will investigate, appreciate assistance.   Chronic Medical Conditions managed on home medications: GERD: protonix HLD: pravastatin 40mg  Onychogryphosis of toes, R > L H/o Prostatic Adenocarcinoma: Flomax  FEN/GI: heart healthy, carb-modified diet PPx: reduced dose lovenox  Disposition: monitor today to ensure patient's blood pressure is stable with beta blocker   Subjective:  Reports he is doing okay. He got up to  the side of the bed and reported of some lightheadedness. No shortness of breath or chest pain.   Objective: Temp:  [98.1 F (36.7 C)-98.9 F (37.2 C)] 98.1 F (36.7 C) (06/02 0618) Pulse Rate:  [72-99] 74 (06/02 0618) Resp:  [16] 16 (06/01 2032) BP: (93-111)/(55-68) 111/67 (06/02 0618) SpO2:  [91 %-96 %] 96 % (06/02 0717) Weight:  [310 lb 13.6 oz (141 kg)] 310 lb 13.6 oz (141 kg) (06/02 0500) Physical Exam: Gen: obese, NAD, alert and oriented Heart: Regular rate and rhythm, normal Z3-A0, 2/6 systolic murmur.  Lungs: Difficult secondary to body habitus although normal work of breathing clear to auscultation bilaterally Abdomen: soft, no TTP Extremities: legs wrapped, some swelling noted in lower extremities bilaterally most consistent with lymphedema.  Psych: Normal mood and affect  Laboratory: Recent Labs  Lab 05/29/18 0306 05/30/18 0707 05/31/18 0405  WBC 8.1 7.0 8.1  HGB 10.0* 10.9* 11.4*  HCT 32.5* 35.3* 36.9*  PLT 296 309 302   Recent Labs  Lab 05/29/18 0306 05/30/18 0707 05/31/18 0405  NA 136 135 136  K 3.9 3.9 4.2  CL 93* 94* 95*  CO2 36* 31 31  BUN 39* 34* 30*  CREATININE 1.97* 1.39* 1.36*  CALCIUM 8.7* 9.6 9.7  GLUCOSE 89 157* 175*   ECHO: - Left ventricle: The cavity size was normal. There was mild   concentric hypertrophy. Systolic function was normal. The   estimated ejection fraction was in the range of 60% to 65%. Wall   motion was normal; there were no regional wall motion   abnormalities. Features are consistent with a pseudonormal left   ventricular filling pattern, with concomitant abnormal relaxation   and increased filling pressure (grade 2 diastolic dysfunction).   Doppler parameters are consistent with high ventricular filling   pressure. - Aortic valve: Valve mobility was restricted. - Mitral valve: Calcified annulus. Mild focal calcification of the   anterior leaflet. - Left atrium: The atrium was mildly dilated. - Right ventricle: The  cavity size was mildly dilated. Wall   thickness was normal. Systolic function was mildly reduced. - Tricuspid valve: There was trivial regurgitation. - Pulmonary arteries: Systolic pressure could not be accurately   estimated.  Smiley Houseman, MD 05/31/2018, 7:33 AM PGY-3, [place] Intern pager: [phone], text pages welcome

## 2018-05-31 NOTE — Progress Notes (Signed)
PT eval re-ordered d/t patient's on-going decreased mobility. Pt adamant that he will NOT go to SNF, he will go home as originally planned however politely stated that PT could come work with him today if they'd like. Spoke with PT who stated they would not be by to see patient today for a re-eval due to patient load for the weekend and that their recommendation for SNF would not change. Spoke with Resident, Dr. Shawna Orleans, who stated despite patient's refusal to go to SNF, he will still not be discharged today. Stated pt's metoprolol was held last night d/t low BP (which has been an on-going issue since admission) and his metoprolol was restarted at 12.5mg  this am - informed resident that patient's BP was stable after this morning's dose of metoprolol and patient wasn't having any issues related to his BP per his primary RN. Resident stated that regardless of the above, her attending did not want patient discharged today, she wanted to watch him on the lowest dose of metoprolol and see how he did overnight. Pt and primary RN updated.

## 2018-06-01 LAB — BASIC METABOLIC PANEL
ANION GAP: 8 (ref 5–15)
BUN: 28 mg/dL — ABNORMAL HIGH (ref 6–20)
CALCIUM: 9.3 mg/dL (ref 8.9–10.3)
CO2: 29 mmol/L (ref 22–32)
Chloride: 99 mmol/L — ABNORMAL LOW (ref 101–111)
Creatinine, Ser: 1.19 mg/dL (ref 0.61–1.24)
GFR calc Af Amer: >60 mL/min (ref >60)
GFR calc non Af Amer: 57 mL/min — ABNORMAL LOW (ref >60)
GLUCOSE: 132 mg/dL — AB (ref 65–99)
Potassium: 4.2 mmol/L (ref 3.5–5.1)
Sodium: 136 mmol/L (ref 135–145)

## 2018-06-01 LAB — GLUCOSE, CAPILLARY
GLUCOSE-CAPILLARY: 138 mg/dL — AB (ref 65–99)
Glucose-Capillary: 169 mg/dL — ABNORMAL HIGH (ref 65–99)
Glucose-Capillary: 234 mg/dL — ABNORMAL HIGH (ref 65–99)
Glucose-Capillary: 164 mg/dL — ABNORMAL HIGH (ref 65–99)

## 2018-06-01 MED ORDER — SODIUM CHLORIDE 0.9 % IV BOLUS
1000.0000 mL | Freq: Once | INTRAVENOUS | Status: AC
  Administered 2018-06-01: 1000 mL via INTRAVENOUS

## 2018-06-01 NOTE — Plan of Care (Signed)
  Problem: Activity: Goal: Risk for activity intolerance will decrease Outcome: Progressing   Problem: Education: Goal: Knowledge of General Education information will improve Outcome: Completed/Met   Problem: Nutrition: Goal: Adequate nutrition will be maintained Outcome: Completed/Met   Problem: Coping: Goal: Level of anxiety will decrease Outcome: Completed/Met

## 2018-06-01 NOTE — Progress Notes (Signed)
SATURATION QUALIFICATIONS: (This note is used to comply with regulatory documentation for home oxygen)  Patient Saturations on Room Air at Rest = 89%  Patient Saturations on Room Air while Transferring = 88%  Patient Saturations on 2 Liters of oxygen while transferrring = 94%  Please briefly explain why patient needs home oxygen:Pt desats below 90% on RA at rest and with activity.  [place] [phone] (pager)

## 2018-06-01 NOTE — Progress Notes (Signed)
Physical Therapy Treatment Patient Details Name: MARKISE HAYMER MRN: [id number] DOB: 1941/08/05 Today's Date: 06/01/2018    History of Present Illness Kenly Xiao is a 77yo M presenting with 3 day history of progressively worsening shortness of breath.  PMH significant for T2DM, BLE lymphedema, elephantiasis nostra verrucosa of RLE.     PT Comments    Pt admitted with above diagnosis. Pt currently with functional limitations due to balance and endurance deficits as well as strength deficits. Pt was able to transfer bed to recliner with supervision with bed raised up as pt states his bed raises at home.  Pt however could not get up from low recliner without +2 assist.  Pt states his power chair is taller at home and he will have not trouble.  Pt also states he can use his crutches.  Reinforced need for RW and pt then states that he will as hospital is getting him one.  Decr awareness of deficits and poor overall safety.  Will follow acutely.  Pt will benefit from skilled PT to increase their independence and safety with mobility to allow discharge to the venue listed below.     Follow Up Recommendations  SNF     Equipment Recommendations  Rolling walker with 5" wheels(bariatric)    Recommendations for Other Services       Precautions / Restrictions Precautions Precautions: Fall Restrictions Weight Bearing Restrictions: No    Mobility  Bed Mobility Overal bed mobility: Needs Assistance Bed Mobility: Supine to Sit;Sit to Supine Rolling: Supervision Sidelying to sit: Supervision;HOB elevated Supine to sit: Supervision;HOB elevated;Min guard     General bed mobility comments: Pt was able to come to EOB with use of rails without assist.  Pt has hospital bed at home. Sats >90% on  RA.   Transfers Overall transfer level: Needs assistance Equipment used: Rolling walker (2 wheeled) Transfers: Sit to/from [facility] Sit to Stand: Min guard;Mod assist;+2 physical  assistance Stand pivot transfers: Supervision       General transfer comment: Pt Raised bed up to assist with sit to stand.  STeady overall coming to stand from bed.  Slow pivotal steps from  bed to recliner without physical assist.  Pt sat in chair long enough for bed to be changed.  Pt was in recliner with two pillows to raise height.  Tried multiple attempts on his own and pt could not get up from low recliner.  Pt also tried with +1 assist.  It was only with +2 mmod assist that pt was able to stand from low chair due to he cannot get his left LE placed to where he can stand on it because he has decr flexion in that LE.  Once up from low recliner pt pivoted back to bed with supervision.  Pt states his power wheelchair is tall at home and he has no trouble with this at home.     Ambulation/Gait             General Gait Details: Pt states he only transfers at home   Stairs             Wheelchair Mobility    Modified Rankin (Stroke Patients Only)       Balance Overall balance assessment: Needs assistance Sitting-balance support: Bilateral upper extremity supported;Feet supported Sitting balance-Leahy Scale: Fair     Standing balance support: Bilateral upper extremity supported Standing balance-Leahy Scale: Poor Standing balance comment: walker and supervision for static standing  Cognition Arousal/Alertness: Awake/alert Behavior During Therapy: WFL for tasks assessed/performed Overall Cognitive Status: Within Functional Limits for tasks assessed Area of Impairment: Safety/judgement                         Safety/Judgement: Decreased awareness of safety;Decreased awareness of deficits     General Comments: pt requires extensive period of time to consider therapist's requests      Exercises General Exercises - Lower Extremity Ankle Circles/Pumps: AROM;Both;10 reps;Seated Long Arc Quad: AROM;Both;10 reps;Supine     General Comments General comments (skin integrity, edema, etc.): SpO2 88% and above throughout session      Pertinent Vitals/Pain Pain Assessment: No/denies pain    Home Living                      Prior Function            PT Goals (current goals can now be found in the care plan section) Acute Rehab PT Goals Patient Stated Goal: to return home Progress towards PT goals: Progressing toward goals    Frequency    Min 3X/week      PT Plan Current plan remains appropriate    Co-evaluation              AM-PAC PT "6 Clicks" Daily Activity  Outcome Measure  Difficulty turning over in bed (including adjusting bedclothes, sheets and blankets)?: None Difficulty moving from lying on back to sitting on the side of the bed? : None Difficulty sitting down on and standing up from a chair with arms (e.g., wheelchair, bedside commode, etc,.)?: A Lot Help needed moving to and from a bed to chair (including a wheelchair)?: A Little Help needed walking in hospital room?: Total Help needed climbing 3-5 steps with a railing? : Total 6 Click Score: 15    End of Session Equipment Utilized During Treatment: Gait belt Activity Tolerance: Patient tolerated treatment well Patient left: with call bell/phone within reach;in bed;with nursing/sitter in room(sitting EOB with NT setting pt up for bath) Nurse Communication: Mobility status PT Visit Diagnosis: Muscle weakness (generalized) (M62.81);Difficulty in walking, not elsewhere classified (R26.2)     Time: 0930-1020 PT Time Calculation (min) (ACUTE ONLY): 50 min  Charges:  $Therapeutic Activity: 38-52 mins                    G Codes:       Danaiya Steadman,PT Acute Rehabilitation [phone] (pager)    Denice Paradise 06/01/2018, 10:33 AM

## 2018-06-01 NOTE — Progress Notes (Signed)
Occupational Therapy Treatment Patient Details Name: [name] MRN: [id number] DOB: 04/20/1941 Today's Date: 06/01/2018    History of present illness [name] is a 77yo M presenting with 3 day history of progressively worsening shortness of breath.  PMH significant for T2DM, BLE lymphedema, elephantiasis nostra verrucosa of RLE.    OT comments  RN tech finished pts bath upon arrival; reports he assisted minimally with bathing. Pt able to perform functional mobility in room with min guard assist today; fatigues quickly. Reviewed energy conservation strategies and fall prevention strategies for home. D/c plan remains appropriate. Will continue to follow acutely.   Follow Up Recommendations  Home health OT(pt refusing SNF)    Equipment Recommendations  None recommended by OT    Recommendations for Other Services      Precautions / Restrictions Precautions Precautions: Fall Restrictions Weight Bearing Restrictions: No       Mobility Bed Mobility Overal bed mobility: Needs Assistance Bed Mobility: Sit to Supine  Sit to supine: Min assist   General bed mobility comments: Assist for LLE into bed.  Transfers Overall transfer level: Needs assistance Equipment used: Rolling walker (2 wheeled) Transfers: Sit to/from Stand Sit to Stand: Min guard;From elevated surface        General transfer comment: Good hand placement and technique with RW. Pt requires bed elevated to perform safe sit to stand.    Balance Overall balance assessment: Needs assistance Sitting-balance support: Feet supported;No upper extremity supported Sitting balance-Leahy Scale: Good     Standing balance support: Bilateral upper extremity supported Standing balance-Leahy Scale: Poor Standing balance comment: RW for support                           ADL either performed or assessed with clinical judgement   ADL Overall ADL's : Needs assistance/impaired     Grooming: Set  up;Sitting;Wash/dry face;Applying deodorant     Upper Body Bathing Details (indicate cue type and reason): RN tech assisting with bath upon arrival; reports she did all of UB bathing   Lower Body Bathing Details (indicate cue type and reason): RN tech assisting with bath upon arrival; reports she did all of LB bathing at EOB. Upper Body Dressing : Minimal assistance;Sitting Upper Body Dressing Details (indicate cue type and reason): to don gown     Toilet Transfer: Min guard;Ambulation;RW Toilet Transfer Details (indicate cue type and reason): Simulated by sit to stand from elevated EOB with functional mobility in room. Pt reports his power chair fits in the bathroom so he can transfer only if needed instead of walk to bathroom.         Functional mobility during ADLs: Min guard;Rolling walker General ADL Comments: Educated pt on energy conservaion strategies and home safety since pts plan is to return home following d/c.     Vision       Perception     Praxis      Cognition Arousal/Alertness: Awake/alert Behavior During Therapy: WFL for tasks assessed/performed Overall Cognitive Status: Within Functional Limits for tasks assessed Area of Impairment: Safety/judgement                         Safety/Judgement: Decreased awareness of safety;Decreased awareness of deficits             Exercises    Shoulder Instructions       General Comments     Pertinent Vitals/ Pain  Pain Assessment: No/denies pain  Home Living                                          Prior Functioning/Environment              Frequency  Min 2X/week        Progress Toward Goals  OT Goals(current goals can now be found in the care plan section)  Progress towards OT goals: Progressing toward goals  Acute Rehab OT Goals Patient Stated Goal: to return home OT Goal Formulation: With patient  Plan Discharge plan remains appropriate     Co-evaluation                 AM-PAC PT "6 Clicks" Daily Activity     Outcome Measure   Help from another person eating meals?: None Help from another person taking care of personal grooming?: A Little Help from another person toileting, which includes using toliet, bedpan, or urinal?: A Lot Help from another person bathing (including washing, rinsing, drying)?: A Lot Help from another person to put on and taking off regular upper body clothing?: A Little Help from another person to put on and taking off regular lower body clothing?: A Lot 6 Click Score: 16    End of Session Equipment Utilized During Treatment: Rolling walker  OT Visit Diagnosis: Unsteadiness on feet (R26.81);Muscle weakness (generalized) (M62.81)   Activity Tolerance Patient tolerated treatment well   Patient Left in bed;with call bell/phone within reach   Nurse Communication          Time: 2751-7001 OT Time Calculation (min): 35 min  Charges: OT General Charges $OT Visit: 1 Visit OT Treatments $Self Care/Home Management : 23-37 mins  Forrest Demuro A. Ulice Brilliant, M.S., OTR/L Acute Rehab Department: [phone]   Binnie Kand 06/01/2018, 11:05 AM

## 2018-06-01 NOTE — Progress Notes (Signed)
Family Medicine Teaching Service Daily Progress Note Intern Pager: [phone]  Patient name: [name] Medical record number: [id number] Date of birth: 1941/07/04 Age: 77 y.o. Gender: male  Primary Care Provider: Tonette Bihari, MD Consultants: none Code Status: FULL  Pt Overview and Major Events to Date:  5/20: patient admitted to teaching service 5/23: Echo notable for G2DD, EF 60-65% 5/29 AKI found, impeding discharge 6/1: AKI resolved but has hypotension  Assessment and Plan: [name] is a 77yo M presenting with 3 day history of progressively worsening shortness of breath.  PMH significant for T2DM, BLE lymphedema, elephantiasis nostra verrucosa of RLE.   AKI: Resolved. Cr 2.39>1.97>1.39 >1.36 > 1.19. Baseline Cr~1.  Likely prerenal etiology secondary to overdiuresis with Lasix for presumed CHF exacerbation. -Holding furosemide and triamterene/hydrochlorothiazide -Hold off on IV fluids for now as he has had adequate p.o. intake  Hypotension. Stable. Likely secondary to over diuresis vs overtreating hypertension. Blood pressure improved to 11/67 this morning, but evening metoprolol was held. SBP past 24hrs has been 93-105 DBP has been 58-62. 93/58 this am. Not medically stable for discharge today due to hypotension and medication adjustments need to be made and verified that it will not make his hypotension worse. Will give 1L bolus and monitor for improvement overnight. -Vitals per floor protocol - cont metoprolol 12.5mg  BID - restart furosemide 40mg  daily, down from twice daily on discharge -Continue to hold diuretics and antihypertensives as above  HFpEF with hypoxia: 5/23 Echo: grade 2 diastolic dysfunction, LVED 60-65%.  Weight 310 today.  Dry weight likely 312-315, diuresed down from 340 on admission. No hypoxia as of now, on room air. Suspect contribution of OSA and OHSgiven morbid obesity, as well.  - Holding diuresis as above Weight 309lbs this am - Daily  Weights (dry weight 312-315 likely) - Strict I/O's - monitor BMP - Metoprolol - has home O2 delivered (of note, he is on room air here. Will ambulate with pulse ox)   T2DM: Hga1c is 9.3 on admission.  CBGs range over past 24 hours have been 132-169. Home regimen is Levemir 48 units BID.  2 units of SSI required over last 24 hours. - CBGS with meals and before bed - sensitive SSI - Levemir 20 U daily, will be discharge dose and can be titrated by pcp  Lives alone with limited help: Recommended SNF placement, patient declined - Home health ordered  Elephantiasis nostra verrucosa  Bilateral LE Lymphedema: stable. Has not seen wound clinic for the past three months.  Not on ABX.  - per wound care - continue to apply Ammonium lactate 12% lotion  Anemia of Chronic Disease: Stable. Unknown etiology, could be secondary to possible CHF.  Pressure ulcer of upper posterior thigh, right, stage III - Wound care per Wound Care service  Monilla Intertrigo of abdominal pannus and intergluteal cleft  - Nystatin powder bid  Prostatic Adenocarcinoma: Per patient, urologist is [facility], per their office his last visit was Dec 2017.  CT abdomen/pelvis negative for any obvious pathology concerning for metastatic cancer, however prostate was not entirely visualized.  This appears to not be concerning for acute process, recommend further outpatient work-up. PSA elevated at 12.94 on 5/21.  - Outpatient follow-up warranted - hold flomax; restart at 1/2 home dose on discharge  Right Cheek Mass: See prior notes.  Appears to be stable at this time, will recommend further outpatient workup.   Infestation by bed bug Appears to be resolved. Patient reports bed bugs "should  be gone by now," although unclear if he actually had professional help come in. [facility] will investigate, appreciate assistance.   Chronic Medical Conditions managed on home medications: GERD: protonix HLD: pravastatin  40mg  Onychogryphosis of toes, R > L H/o Prostatic Adenocarcinoma: Flomax   FEN/GI: heart healthy, carb-modified diet PPx: reduced dose lovenox  Disposition: monitor today to ensure patient's blood pressure is stable with beta blocker   Subjective:  Reports he is doing well this am. He is sitting up in bed and is awake. He denies dizziness, lightheadedness, or confusion. No chest pain, SOB, or difficulty breathing. He has not been up and ambulating yet today.  Objective: Temp:  [97.9 F (36.6 C)-98.3 F (36.8 C)] 97.9 F (36.6 C) (06/03 0421) Pulse Rate:  [68-78] 74 (06/03 0421) Resp:  [16] 16 (06/02 2001) BP: (93-105)/(50-63) 93/58 (06/03 0421) SpO2:  [92 %-94 %] 92 % (06/03 0421) Weight:  [309 lb 8.4 oz (140.4 kg)] 309 lb 8.4 oz (140.4 kg) (06/03 0421) Physical Exam:  Gen: Alert and Oriented x 3, NAD HEENT: Normocephalic, atraumatic, PERRLA, EOMI CV: RRR, 2/6 systolic murmur, normal S1, S2 split, +2 pulses dorsalis pedis bilaterally Resp: CTAB, no wheezing, rales, or rhonchi, comfortable work of breathing Abd: obese, non-distended, non-tender, soft, +bs in all four quadrants MSK: Moves all four extremities Ext: no clubbing, cyanosis, +2 edema bilateral LE above leg wraps. Skin: warm, dry, intact, no rashes  Laboratory: Recent Labs  Lab 05/29/18 0306 05/30/18 0707 05/31/18 0405  WBC 8.1 7.0 8.1  HGB 10.0* 10.9* 11.4*  HCT 32.5* 35.3* 36.9*  PLT 296 309 302   Recent Labs  Lab 05/30/18 0707 05/31/18 0405 06/01/18 0340  NA 135 136 136  K 3.9 4.2 4.2  CL 94* 95* 99*  CO2 31 31 29   BUN 34* 30* 28*  CREATININE 1.39* 1.36* 1.19  CALCIUM 9.6 9.7 9.3  GLUCOSE 157* 175* 132*   ECHO: - Left ventricle: The cavity size was normal. There was mild   concentric hypertrophy. Systolic function was normal. The   estimated ejection fraction was in the range of 60% to 65%. Wall   motion was normal; there were no regional wall motion   abnormalities. Features are consistent  with a pseudonormal left   ventricular filling pattern, with concomitant abnormal relaxation   and increased filling pressure (grade 2 diastolic dysfunction).   Doppler parameters are consistent with high ventricular filling   pressure. - Aortic valve: Valve mobility was restricted. - Mitral valve: Calcified annulus. Mild focal calcification of the   anterior leaflet. - Left atrium: The atrium was mildly dilated. - Right ventricle: The cavity size was mildly dilated. Wall   thickness was normal. Systolic function was mildly reduced. - Tricuspid valve: There was trivial regurgitation. - Pulmonary arteries: Systolic pressure could not be accurately   estimated.  Nuala Alpha, DO 06/01/2018, 8:16 AM PGY-1, [place] Intern pager: [phone], text pages welcome

## 2018-06-02 ENCOUNTER — Ambulatory Visit: Payer: PPO | Admitting: Internal Medicine

## 2018-06-02 LAB — GLUCOSE, CAPILLARY
GLUCOSE-CAPILLARY: 148 mg/dL — AB (ref 65–99)
Glucose-Capillary: 167 mg/dL — ABNORMAL HIGH (ref 65–99)

## 2018-06-02 NOTE — Care Management Important Message (Signed)
Important Message  Patient Details  Name: [name] MRN: [id number] Date of Birth: 1941/09/24   Medicare Important Message Given:  Yes    Bethena Roys, RN 06/02/2018, 11:02 AM

## 2018-06-03 ENCOUNTER — Other Ambulatory Visit: Payer: Self-pay

## 2018-06-03 NOTE — Consult Note (Signed)
   [facility] CM Inpatient Consult   06/03/2018  [name] [birth year] [id number]  Late entry for 06/02/18 1600:  Met with the patient at the bedside. Patient states he feels he will manage alright at home.  He states that he has another phone and he has had PMI to complete the treatment for the bedbugs.  He states he is willing to have follow up after [place] has been in.  Will have [place] social worker as patient has been refusing SNF prior to home for rehab and [place] follow for COPD program patient wants calls and determine for home visits when home health has completed, has oxygen with [facility].  Consent form signed.  Please contact for questions:  [name], RN BSN [facility] Liaison  [phone] business mobile phone Toll free office [phone]

## 2018-06-04 ENCOUNTER — Emergency Department (HOSPITAL_COMMUNITY): Payer: PPO

## 2018-06-04 ENCOUNTER — Encounter (HOSPITAL_COMMUNITY): Payer: Self-pay | Admitting: Emergency Medicine

## 2018-06-04 ENCOUNTER — Other Ambulatory Visit: Payer: Self-pay

## 2018-06-04 ENCOUNTER — Inpatient Hospital Stay (HOSPITAL_COMMUNITY)
Admission: EM | Admit: 2018-06-04 | Discharge: 2018-06-08 | DRG: 193 | Disposition: A | Discharge status: Skilled Nursing Facility | Payer: PPO | Attending: Family Medicine | Admitting: Family Medicine

## 2018-06-04 ENCOUNTER — Other Ambulatory Visit: Payer: Self-pay | Admitting: *Deleted

## 2018-06-04 DIAGNOSIS — R079 Chest pain, unspecified: Secondary | ICD-10-CM | POA: Diagnosis not present

## 2018-06-04 DIAGNOSIS — Z96643 Presence of artificial hip joint, bilateral: Secondary | ICD-10-CM | POA: Diagnosis not present

## 2018-06-04 DIAGNOSIS — J181 Lobar pneumonia, unspecified organism: Principal | ICD-10-CM | POA: Diagnosis present

## 2018-06-04 DIAGNOSIS — T502X5A Adverse effect of carbonic-anhydrase inhibitors, benzothiadiazides and other diuretics, initial encounter: Secondary | ICD-10-CM | POA: Diagnosis not present

## 2018-06-04 DIAGNOSIS — E860 Dehydration: Secondary | ICD-10-CM

## 2018-06-04 DIAGNOSIS — Z8249 Family history of ischemic heart disease and other diseases of the circulatory system: Secondary | ICD-10-CM

## 2018-06-04 DIAGNOSIS — I5042 Chronic combined systolic (congestive) and diastolic (congestive) heart failure: Secondary | ICD-10-CM | POA: Diagnosis present

## 2018-06-04 DIAGNOSIS — Z7401 Bed confinement status: Secondary | ICD-10-CM | POA: Diagnosis not present

## 2018-06-04 DIAGNOSIS — I4891 Unspecified atrial fibrillation: Secondary | ICD-10-CM | POA: Diagnosis present

## 2018-06-04 DIAGNOSIS — I872 Venous insufficiency (chronic) (peripheral): Secondary | ICD-10-CM | POA: Diagnosis not present

## 2018-06-04 DIAGNOSIS — K219 Gastro-esophageal reflux disease without esophagitis: Secondary | ICD-10-CM | POA: Diagnosis present

## 2018-06-04 DIAGNOSIS — E861 Hypovolemia: Secondary | ICD-10-CM | POA: Diagnosis not present

## 2018-06-04 DIAGNOSIS — N179 Acute kidney failure, unspecified: Secondary | ICD-10-CM | POA: Diagnosis present

## 2018-06-04 DIAGNOSIS — J189 Pneumonia, unspecified organism: Secondary | ICD-10-CM

## 2018-06-04 DIAGNOSIS — Y95 Nosocomial condition: Secondary | ICD-10-CM | POA: Diagnosis present

## 2018-06-04 DIAGNOSIS — I890 Lymphedema, not elsewhere classified: Secondary | ICD-10-CM | POA: Diagnosis not present

## 2018-06-04 DIAGNOSIS — E119 Type 2 diabetes mellitus without complications: Secondary | ICD-10-CM | POA: Diagnosis not present

## 2018-06-04 DIAGNOSIS — E785 Hyperlipidemia, unspecified: Secondary | ICD-10-CM | POA: Diagnosis not present

## 2018-06-04 DIAGNOSIS — E65 Localized adiposity: Secondary | ICD-10-CM | POA: Diagnosis present

## 2018-06-04 DIAGNOSIS — B888 Other specified infestations: Secondary | ICD-10-CM | POA: Diagnosis present

## 2018-06-04 DIAGNOSIS — I110 Hypertensive heart disease with heart failure: Secondary | ICD-10-CM | POA: Diagnosis present

## 2018-06-04 DIAGNOSIS — R069 Unspecified abnormalities of breathing: Secondary | ICD-10-CM | POA: Diagnosis not present

## 2018-06-04 DIAGNOSIS — Z79899 Other long term (current) drug therapy: Secondary | ICD-10-CM

## 2018-06-04 DIAGNOSIS — Z8546 Personal history of malignant neoplasm of prostate: Secondary | ICD-10-CM

## 2018-06-04 DIAGNOSIS — N289 Disorder of kidney and ureter, unspecified: Secondary | ICD-10-CM | POA: Diagnosis not present

## 2018-06-04 DIAGNOSIS — H401130 Primary open-angle glaucoma, bilateral, stage unspecified: Secondary | ICD-10-CM | POA: Diagnosis not present

## 2018-06-04 DIAGNOSIS — H02854 Elephantiasis of left upper eyelid: Secondary | ICD-10-CM | POA: Diagnosis not present

## 2018-06-04 DIAGNOSIS — L89213 Pressure ulcer of right hip, stage 3: Secondary | ICD-10-CM | POA: Diagnosis not present

## 2018-06-04 DIAGNOSIS — E1142 Type 2 diabetes mellitus with diabetic polyneuropathy: Secondary | ICD-10-CM | POA: Diagnosis present

## 2018-06-04 DIAGNOSIS — B372 Candidiasis of skin and nail: Secondary | ICD-10-CM | POA: Diagnosis present

## 2018-06-04 DIAGNOSIS — R0789 Other chest pain: Secondary | ICD-10-CM | POA: Diagnosis not present

## 2018-06-04 DIAGNOSIS — I10 Essential (primary) hypertension: Secondary | ICD-10-CM | POA: Diagnosis not present

## 2018-06-04 DIAGNOSIS — L89892 Pressure ulcer of other site, stage 2: Secondary | ICD-10-CM | POA: Diagnosis not present

## 2018-06-04 DIAGNOSIS — M2550 Pain in unspecified joint: Secondary | ICD-10-CM | POA: Diagnosis not present

## 2018-06-04 DIAGNOSIS — Z6841 Body Mass Index (BMI) 40.0 and over, adult: Secondary | ICD-10-CM | POA: Diagnosis not present

## 2018-06-04 DIAGNOSIS — L304 Erythema intertrigo: Secondary | ICD-10-CM | POA: Diagnosis not present

## 2018-06-04 DIAGNOSIS — J309 Allergic rhinitis, unspecified: Secondary | ICD-10-CM | POA: Diagnosis not present

## 2018-06-04 DIAGNOSIS — B882 Other arthropod infestations: Secondary | ICD-10-CM

## 2018-06-04 DIAGNOSIS — Z794 Long term (current) use of insulin: Secondary | ICD-10-CM | POA: Diagnosis not present

## 2018-06-04 DIAGNOSIS — I509 Heart failure, unspecified: Secondary | ICD-10-CM | POA: Diagnosis not present

## 2018-06-04 DIAGNOSIS — D638 Anemia in other chronic diseases classified elsewhere: Secondary | ICD-10-CM | POA: Diagnosis present

## 2018-06-04 DIAGNOSIS — R112 Nausea with vomiting, unspecified: Secondary | ICD-10-CM | POA: Diagnosis not present

## 2018-06-04 DIAGNOSIS — R0602 Shortness of breath: Secondary | ICD-10-CM | POA: Diagnosis not present

## 2018-06-04 LAB — I-STAT CHEM 8, ED
BUN: 42 mg/dL — ABNORMAL HIGH (ref 6–20)
BUN: 46 mg/dL — AB (ref 6–20)
CALCIUM ION: 1.16 mmol/L (ref 1.15–1.40)
CHLORIDE: 99 mmol/L — AB (ref 101–111)
CREATININE: 3.1 mg/dL — AB (ref 0.61–1.24)
CREATININE: 2.7 mg/dL — AB (ref 0.61–1.24)
Calcium, Ion: 1.13 mmol/L — ABNORMAL LOW (ref 1.15–1.40)
Chloride: 99 mmol/L — ABNORMAL LOW (ref 101–111)
Glucose, Bld: 223 mg/dL — ABNORMAL HIGH (ref 65–99)
Glucose, Bld: 177 mg/dL — ABNORMAL HIGH (ref 65–99)
HCT: 34 % — ABNORMAL LOW (ref 39.0–52.0)
HEMATOCRIT: 41 % (ref 39.0–52.0)
HEMOGLOBIN: 13.9 g/dL (ref 13.0–17.0)
Hemoglobin: 11.6 g/dL — ABNORMAL LOW (ref 13.0–17.0)
POTASSIUM: 5 mmol/L (ref 3.5–5.1)
Potassium: 5 mmol/L (ref 3.5–5.1)
Sodium: 137 mmol/L (ref 135–145)
Sodium: 137 mmol/L (ref 135–145)
TCO2: 26 mmol/L (ref 22–32)
TCO2: 28 mmol/L (ref 22–32)

## 2018-06-04 LAB — COMPREHENSIVE METABOLIC PANEL
ALBUMIN: 2.5 g/dL — AB (ref 3.5–5.0)
ALT: 10 U/L — AB (ref 17–63)
AST: 11 U/L — AB (ref 15–41)
Alkaline Phosphatase: 86 U/L (ref 38–126)
Anion gap: 8 (ref 5–15)
BUN: 40 mg/dL — AB (ref 6–20)
CHLORIDE: 104 mmol/L (ref 101–111)
CO2: 25 mmol/L (ref 22–32)
CREATININE: 2.52 mg/dL — AB (ref 0.61–1.24)
Calcium: 8.5 mg/dL — ABNORMAL LOW (ref 8.9–10.3)
GFR calc Af Amer: 27 mL/min — ABNORMAL LOW (ref >60)
GFR calc non Af Amer: 23 mL/min — ABNORMAL LOW (ref >60)
Glucose, Bld: 188 mg/dL — ABNORMAL HIGH (ref 65–99)
POTASSIUM: 4.1 mmol/L (ref 3.5–5.1)
Sodium: 137 mmol/L (ref 135–145)
Total Bilirubin: 0.7 mg/dL (ref 0.3–1.2)
Total Protein: 7.2 g/dL (ref 6.5–8.1)

## 2018-06-04 LAB — CBC WITH DIFFERENTIAL/PLATELET
ABS IMMATURE GRANULOCYTES: 0.1 10*3/uL (ref 0.0–0.1)
Basophils Absolute: 0.1 10*3/uL (ref 0.0–0.1)
Basophils Relative: 1 %
Eosinophils Absolute: 0.1 10*3/uL (ref 0.0–0.7)
Eosinophils Relative: 1 %
HEMATOCRIT: 38.2 % — AB (ref 39.0–52.0)
HEMOGLOBIN: 11.7 g/dL — AB (ref 13.0–17.0)
Immature Granulocytes: 1 %
LYMPHS ABS: 1.2 10*3/uL (ref 0.7–4.0)
LYMPHS PCT: 12 %
MCH: 25.1 pg — AB (ref 26.0–34.0)
MCHC: 30.6 g/dL (ref 30.0–36.0)
MCV: 82 fL (ref 78.0–100.0)
MONO ABS: 0.9 10*3/uL (ref 0.1–1.0)
MONOS PCT: 8 %
NEUTROS ABS: 8.5 10*3/uL — AB (ref 1.7–7.7)
Neutrophils Relative %: 77 %
Platelets: 358 10*3/uL (ref 150–400)
RBC: 4.66 MIL/uL (ref 4.22–5.81)
RDW: 14.9 % (ref 11.5–15.5)
WBC: 10.8 10*3/uL — ABNORMAL HIGH (ref 4.0–10.5)

## 2018-06-04 LAB — LACTIC ACID, PLASMA
LACTIC ACID, VENOUS: 1 mmol/L (ref 0.5–1.9)
LACTIC ACID, VENOUS: 2.2 mmol/L — AB (ref 0.5–1.9)

## 2018-06-04 LAB — CBC
HEMATOCRIT: 32.5 % — AB (ref 39.0–52.0)
Hemoglobin: 10.1 g/dL — ABNORMAL LOW (ref 13.0–17.0)
MCH: 25.6 pg — ABNORMAL LOW (ref 26.0–34.0)
MCHC: 31.1 g/dL (ref 30.0–36.0)
MCV: 82.3 fL (ref 78.0–100.0)
PLATELETS: 283 10*3/uL (ref 150–400)
RBC: 3.95 MIL/uL — AB (ref 4.22–5.81)
RDW: 14.9 % (ref 11.5–15.5)
WBC: 10.7 10*3/uL — ABNORMAL HIGH (ref 4.0–10.5)

## 2018-06-04 LAB — BRAIN NATRIURETIC PEPTIDE: B NATRIURETIC PEPTIDE 5: 29.8 pg/mL (ref 0.0–100.0)

## 2018-06-04 LAB — GLUCOSE, CAPILLARY
Glucose-Capillary: 178 mg/dL — ABNORMAL HIGH (ref 65–99)
Glucose-Capillary: 165 mg/dL — ABNORMAL HIGH (ref 65–99)
Glucose-Capillary: 125 mg/dL — ABNORMAL HIGH (ref 65–99)

## 2018-06-04 LAB — TROPONIN I
Troponin I: <0.03 ng/mL (ref <0.03)
Troponin I: <0.03 ng/mL (ref <0.03)
Troponin I: <0.03 ng/mL (ref <0.03)

## 2018-06-04 LAB — I-STAT TROPONIN, ED: Troponin i, poc: 0.01 ng/mL (ref 0.00–0.08)

## 2018-06-04 LAB — STREP PNEUMONIAE URINARY ANTIGEN: Strep Pneumo Urinary Antigen: NEGATIVE

## 2018-06-04 MED ORDER — ONDANSETRON HCL 4 MG/2ML IJ SOLN: 4.0000 mg | Freq: Four times a day (QID) | INTRAMUSCULAR | Status: DC | PRN

## 2018-06-04 MED ORDER — SODIUM CHLORIDE 0.9 % IV SOLN
500.0000 mg | Freq: Once | INTRAVENOUS | Status: DC
  Filled 2018-06-04: qty 500

## 2018-06-04 MED ORDER — PRAVASTATIN SODIUM 40 MG PO TABS
40.0000 mg | ORAL_TABLET | Freq: Every day | ORAL | Status: DC
  Administered 2018-06-04 – 2018-06-07 (×4): 40 mg via ORAL
  Filled 2018-06-04 (×4): qty 1

## 2018-06-04 MED ORDER — ONDANSETRON HCL 4 MG PO TABS: 4.0000 mg | ORAL_TABLET | Freq: Four times a day (QID) | ORAL | Status: DC | PRN

## 2018-06-04 MED ORDER — SODIUM CHLORIDE 0.9 % IV SOLN: 500.0000 mg | INTRAVENOUS | Status: DC

## 2018-06-04 MED ORDER — SODIUM CHLORIDE 0.9 % IV SOLN: 2.0000 g | INTRAVENOUS | Status: DC

## 2018-06-04 MED ORDER — POLYETHYLENE GLYCOL 3350 17 G PO PACK: 17.0000 g | PACK | Freq: Every day | ORAL | Status: DC | PRN

## 2018-06-04 MED ORDER — IOPAMIDOL (ISOVUE-370) INJECTION 76%
INTRAVENOUS | Status: AC
  Filled 2018-06-04: qty 100

## 2018-06-04 MED ORDER — NYSTATIN 100000 UNIT/GM EX POWD
Freq: Two times a day (BID) | CUTANEOUS | Status: DC
  Administered 2018-06-04 – 2018-06-08 (×8): via TOPICAL
  Filled 2018-06-04 (×2): qty 15

## 2018-06-04 MED ORDER — VANCOMYCIN HCL IN DEXTROSE 1-5 GM/200ML-% IV SOLN
1000.0000 mg | Freq: Once | INTRAVENOUS | Status: AC
  Administered 2018-06-04: 1000 mg via INTRAVENOUS
  Filled 2018-06-04: qty 200

## 2018-06-04 MED ORDER — PANTOPRAZOLE SODIUM 40 MG PO TBEC
40.0000 mg | DELAYED_RELEASE_TABLET | Freq: Every day | ORAL | Status: DC
  Administered 2018-06-04 – 2018-06-08 (×5): 40 mg via ORAL
  Filled 2018-06-04 (×5): qty 1

## 2018-06-04 MED ORDER — LATANOPROST 0.005 % OP SOLN
1.0000 [drp] | Freq: Every day | OPHTHALMIC | Status: DC
  Administered 2018-06-04 – 2018-06-07 (×4): 1 [drp] via OPHTHALMIC
  Filled 2018-06-04 (×2): qty 2.5

## 2018-06-04 MED ORDER — ALBUTEROL SULFATE (2.5 MG/3ML) 0.083% IN NEBU: 3.0000 mL | INHALATION_SOLUTION | Freq: Four times a day (QID) | RESPIRATORY_TRACT | Status: DC | PRN

## 2018-06-04 MED ORDER — SODIUM CHLORIDE 0.9 % IV BOLUS
1000.0000 mL | Freq: Once | INTRAVENOUS | Status: AC
  Administered 2018-06-04: 1000 mL via INTRAVENOUS

## 2018-06-04 MED ORDER — ENOXAPARIN SODIUM 80 MG/0.8ML ~~LOC~~ SOLN
70.0000 mg | SUBCUTANEOUS | Status: DC
  Administered 2018-06-04 – 2018-06-07 (×4): 70 mg via SUBCUTANEOUS
  Filled 2018-06-04 (×4): qty 0.8

## 2018-06-04 MED ORDER — INSULIN ASPART 100 UNIT/ML ~~LOC~~ SOLN
0.0000 [IU] | Freq: Three times a day (TID) | SUBCUTANEOUS | Status: DC
  Administered 2018-06-04 (×2): 3 [IU] via SUBCUTANEOUS
  Administered 2018-06-05: 2 [IU] via SUBCUTANEOUS
  Administered 2018-06-05: 1 [IU] via SUBCUTANEOUS
  Administered 2018-06-06 – 2018-06-08 (×5): 2 [IU] via SUBCUTANEOUS
  Administered 2018-06-08: 3 [IU] via SUBCUTANEOUS

## 2018-06-04 MED ORDER — INSULIN DETEMIR 100 UNIT/ML ~~LOC~~ SOLN
10.0000 [IU] | Freq: Every day | SUBCUTANEOUS | Status: DC
  Administered 2018-06-04 – 2018-06-08 (×5): 10 [IU] via SUBCUTANEOUS
  Filled 2018-06-04 (×5): qty 0.1

## 2018-06-04 MED ORDER — ACETAMINOPHEN 325 MG PO TABS: 650.0000 mg | ORAL_TABLET | Freq: Four times a day (QID) | ORAL | Status: DC | PRN

## 2018-06-04 MED ORDER — TAMSULOSIN HCL 0.4 MG PO CAPS
0.8000 mg | ORAL_CAPSULE | Freq: Every morning | ORAL | Status: DC
  Administered 2018-06-04 – 2018-06-05 (×2): 0.8 mg via ORAL
  Filled 2018-06-04 (×2): qty 2

## 2018-06-04 MED ORDER — PIPERACILLIN-TAZOBACTAM 3.375 G IVPB 30 MIN
3.3750 g | Freq: Once | INTRAVENOUS | Status: AC
  Administered 2018-06-04: 3.375 g via INTRAVENOUS
  Filled 2018-06-04: qty 50

## 2018-06-04 MED ORDER — ACETAMINOPHEN 650 MG RE SUPP: 650.0000 mg | Freq: Four times a day (QID) | RECTAL | Status: DC | PRN

## 2018-06-04 MED ORDER — SODIUM CHLORIDE 0.9 % IV SOLN
1.0000 g | Freq: Once | INTRAVENOUS | Status: DC
  Filled 2018-06-04: qty 10

## 2018-06-04 MED ORDER — IOPAMIDOL (ISOVUE-370) INJECTION 76%
100.0000 mL | Freq: Once | INTRAVENOUS | Status: AC | PRN
  Administered 2018-06-04: 100 mL via INTRAVENOUS

## 2018-06-04 MED ORDER — SODIUM CHLORIDE 0.9 % IV SOLN
2.0000 g | INTRAVENOUS | Status: DC
  Administered 2018-06-04: 2 g via INTRAVENOUS
  Filled 2018-06-04 (×2): qty 2

## 2018-06-04 MED ORDER — FLUTICASONE PROPIONATE 50 MCG/ACT NA SUSP
1.0000 | Freq: Every day | NASAL | Status: DC
  Administered 2018-06-05 – 2018-06-08 (×4): 1 via NASAL
  Filled 2018-06-04: qty 16

## 2018-06-04 MED ORDER — ASPIRIN 81 MG PO CHEW
81.0000 mg | CHEWABLE_TABLET | ORAL | Status: DC
  Administered 2018-06-05 – 2018-06-07 (×2): 81 mg via ORAL
  Filled 2018-06-04 (×2): qty 1

## 2018-06-04 MED ORDER — FLUTICASONE PROPIONATE HFA 220 MCG/ACT IN AERO: 1.0000 | INHALATION_SPRAY | Freq: Two times a day (BID) | RESPIRATORY_TRACT | Status: DC

## 2018-06-04 MED ORDER — VANCOMYCIN HCL 10 G IV SOLR: 2000.0000 mg | INTRAVENOUS | Status: DC

## 2018-06-04 NOTE — ED Notes (Signed)
Pt taken to decon shower and washed off. Changed into new scrubs and all linen changed.

## 2018-06-04 NOTE — Consult Note (Signed)
   [facility] CM Inpatient Consult   06/04/2018  [name] 11-07-1941 [id number]    Made aware of re-hospitalization by [place] [facility] LCSW.  [name] readmitted back to the hospital prior [place] contact.  Discussed with inpatient RNCM above notes and that patient could benefit from SNF placement.   Attempted bedside visit. However, pharmacy tech was at bedside earlier.  Will follow up with [name] to discuss his plans. Apparently he came in with bedbugs as well. Also wondering whether [name] could benefit from a goals of care discussion due to his multiple co-morbidities.  Noted [name] declined SNF placement last hospitalization. He lives alone. He could benefit from SNF placement.    Marthenia Rolling, MSN-Ed, RN,BSN [facility] Liaison [phone]

## 2018-06-04 NOTE — Consult Note (Signed)
   [facility] CM Inpatient Consult   06/04/2018  [name] 06-14-1941 [id number]   [facility] Care Management follow up.  Spoke with [name] about his disposition plans. He endorses he lives alone. States he is considering SNF placement. However, he states " it would not have made a difference if I was in rehab, I would have been readmitted anyway because I have pneumonia".   Encouraged [name] to consider SNF placement if recommended.  Made inpatient RNCM aware of conversation with [name].   Marthenia Rolling, MSN-Ed, RN,BSN [facility] Liaison [phone]

## 2018-06-04 NOTE — Consult Note (Signed)
[place] Nurse wound consult note Reason for Consult: bilateral LE Lymphedema Long standing history of bilateral LE lymphedema with use of long term Juxta Light compression wraps at home and history of Unna's boots prior to that. He just left the hospital on 6/419.  He is not able to care for himself at home. Requiring staff to come in to clean him in the ED room after urinating on himself. The [facility] nurse team is familiar with the patient.   Wound type: Superficial weeping of the RLE with bilateral LE skin changes consistent with brawny fibrotic skin from chronic lymphedema. LLE does not appear to be weeping at the time of my assessment Pressure Injury POA: NA Measurement: NA Wound bed: superficial weeping, dry skin  Drainage (amount, consistency, odor) serous, musty odor from chronic skin changes particularly of the RLE Periwound: palpable pulses bilaterally  Dressing procedure/placement/frequency: Unna's boots bilaterally, change weekly. Orthopedic tech notified of orders.   Will need HHRN or SNF that can change Unna's boots weekly until he can obtain new compression stockings. Reports current Juxta Light stockings are "worn out".  Discussed POC with patient and bedside nurse.  Re consult if needed, will not follow at this time. Thanks  Cleve Paolillo [employer], RN,CWOCN, CNS, [place] [phone])

## 2018-06-04 NOTE — Progress Notes (Signed)
Pharmacy Antibiotic Note  [name] is a 77 y.o. male admitted on 06/04/2018 with pneumonia.  Pharmacy has been consulted for vancomycin and cefepime dosing. Pt is afebrile and WBC is WNL. SCr is elevated at 2.7. Pt has already received a dose of vancomycin and zosyn.   Plan: Vancomycin 1gm IV x 1 (to complete loading dose of 2gm) then 2gm IV Q48H Cefepime 2gm IV Q24h F/u renal fxn, C&S, clinical status and trough at SS  Height: 6' (182.9 cm) Weight: (!) 315 lb (142.9 kg) IBW/kg (Calculated) : 77.6  Temp (24hrs), Avg:98.5 F (36.9 C), Min:98.5 F (36.9 C), Max:98.5 F (36.9 C)  Recent Labs  Lab 05/29/18 0306 05/30/18 0707 05/31/18 0405 06/01/18 0340 06/04/18 0304 06/04/18 0322 06/04/18 0735  WBC 8.1 7.0 8.1  --  10.8*  --   --   CREATININE 1.97* 1.39* 1.36* 1.19  --  3.10* 2.70*    Estimated Creatinine Clearance: 33.1 mL/min (A) (by C-G formula based on SCr of 2.7 mg/dL (H)).    Allergies  Allergen Reactions  . Ace Inhibitors Cough    With Lisinopril.  . Angiotensin Receptor Blockers Other (See Comments)    Caused excessive weight gain  . Metformin Diarrhea    Antimicrobials this admission: Vanc 6/6>> Cefepime 6/6 >> Zosyn x 1 6/6  Dose adjustments this admission: N/A  Microbiology results: Pending  Thank you for allowing pharmacy to be a part of this patient's care.  Marquelle Balow, Rande Lawman 06/04/2018 9:52 AM

## 2018-06-04 NOTE — H&P (Addendum)
[facility] Admission History and Physical Service Pager: [phone]  Patient name: [name] Medical record number: [id number] Date of birth: Aug 05, 1941 Age: 77 y.o. Gender: male  Primary Care Provider: Tonette Bihari, MD Consultants: None  Code Status: Full   Chief Complaint: SOB  Assessment and Plan: [name] is a 77 y.o. male presenting with SOB. PMH is significant for prostate adenocarcinoma, anemia, BLE lymphadema, chronic non-healing wounds BLE, T2DM, elephantiasis nostras verrucosa RLE, impaired mobility, morbid obesity, hypertension, HLD  SOB likely due to HCAP: Presenting with shortness of breath following a recent discharge from the hospital on 6/3 -CTA positive for left lower lobe pneumonia and slight WBC 10.8.  PE ruled out with CTA.  BNP 29.8 + hypovolemic on exam making CHF unlikely cause -patient currently at dry weight of 315 lbs. EKG showing rate controlled Afib -thought not noted on telemetry this AM, initial troponin negative - will rule out ACS.  - Telemetry, attending Dr. Andria Frames  -  Received Vanc and Zosyn x 1 in the ED, continue cefepime and vanc for HCAP  -  Obtain strep pneumo, Legionella -  Blood cultures pending  -  Troponin x 3 + EKG in the AM  -  Obtain Lactic Acid  -  PT/OT tomorrow   AKI: (last Scr 1.19 at discharge 6/3)  SCr 3.10> 2.70 Likely prerenal given patient with AKI at discharge and hypovolemia on examination. Received 3 Liters in ED  - Continue oral hydration  - Hold blood pressure meds+ diuretics   Rate controlled Afib:  - Repeat EKG after hydration   CTA positive for thoracic aneurysm 4.6 cm  - Annual Scanning  - Outpatient referral to Cardiothoracic Surgery   HFpEF: 5/23 Echo: grade 2 diastolic dysfunction, LVED 60-65%.  Weight 315, at dry weight today. Suspect contribution of OSA and OHSgiven morbid obesity, as well.  - Holding diuresis as above - Daily weights  - Strict I/O's  T2DM:  Hga1c is 9.3. Levemir 20 units BID  - CBGS with meals and before bed - Moderate SSI, decreased Levermir 10 units BID  Elephantiasis nostra verrucosa  Bilateral LE Lymphedema: stable. - Per wound care   Pressure ulcer of upper posterior thigh, right, stage III - Wound care per Wound Care service  [place] Intertrigo of abdominal pannus and intergluteal cleft - Nystatin powder bid  Prostatic Adenocarcinoma: Per patient, urologist is [facility], per their office his last visit was Dec 2017.  CT abdomen/pelvis negative for any obvious pathology concerning for metastatic cancer, however prostate was not entirely visualized.  This appears to not be concerning for acute process, recommend further outpatient work-up. PSA elevated at 12.94 on 5/21.  - Outpatient follow-up warranted - continue flomax  Infestation by bed bug - Still infested with bed bugs per ED provider - Again would continue discussing patient going to a facility   FEN/GI: Carb modified diet  Prophylaxis: Lovenox   Disposition: Facility vs. Home   History of Present Illness:  [name] is a 77 y.o. male presenting with SOB last night. Patient was discharged from hospital about 3 days ago.  He states that initially he was doing okay at home.  Last night however he developed shortness of breath, he became nauseated.  Denied any chest pain or diaphoresis. He called EMS and was transported to the ED for evaluation.  Indicates that the shortness of breath was worse with activity. States he had been taking his lasix and other  medications. He indicates he has not been eating much due to limited appetite, drinking about 3-4 glasses of water a day. Patient denies any congestion or sputum production, fevers. Does indicate a dry nagging cough, does feel like he has been a bit more hoarse since discharge from the hospital but no sore throat.  Review Of Systems: Per HPI with the following additions:  Review of Systems   Constitutional: Negative for chills and fever.  HENT: Negative for congestion and sore throat.   Eyes: Negative for pain and discharge.  Respiratory: Positive for cough and shortness of breath. Negative for sputum production and wheezing.   Cardiovascular: Negative for chest pain and palpitations.  Gastrointestinal: Positive for nausea. Negative for abdominal pain, constipation, diarrhea and vomiting.  Genitourinary: Negative for dysuria and urgency.  Musculoskeletal: Negative for myalgias and neck pain.  Neurological: Negative for focal weakness and headaches.  Psychiatric/Behavioral: Negative for depression and suicidal ideas. The patient is not nervous/anxious.     Patient Active Problem List   Diagnosis Date Noted  . Hypotension   . Acute on chronic combined systolic and diastolic congestive heart failure ([place])   . Fluid overload 05/19/2018  . Elephantiasis nostra verrucosa 05/19/2018  . Pressure ulcer of upper thigh, right, stage III ([place]) 05/19/2018  . Intertrigo 05/19/2018  . Chronic venous insufficiency, Bilateral Legs 05/19/2018  . Elevated PSA, greater than or equal to 20 ng/ml 05/19/2018  . History of prostate surgery, Per Patient report, [facility] 05/19/2018  . Infestation by bed bug 05/19/2018  . Lives alone with limited help 05/19/2018  . Morbid obesity ([place]) 05/19/2018  . Dyspnea and respiratory abnormality 05/19/2018  . Systolic ejection murmur with radiation into Cartoids bilaterally 05/19/2018  . Paget's bone disease 05/19/2018  . Pulmonary hypertension ([place]) 05/19/2018  . Hypoxia   . AKI (acute kidney injury) ([place])   . Elephantiasis nostras 03/29/2016  . Impaired mobility 09/09/2014  . Diabetic peripheral neuropathy associated with type 2 diabetes mellitus ([place]) 02/07/2014  . Ulcer of left lower leg ([place]) 10/05/2012  . Anemia 05/21/2012  . Chronic leg pain 11/17/2011  . Chronic osteomyelitis of lower leg ([place]) 11/20/2010  . HLD (hyperlipidemia)  12/01/2008  . ADENOCARCINOMA, PROSTATE 05/24/2008  . DM type 2, uncontrolled, with neuropathy ([place]) 01/20/2008  . Morbid obesity with body mass index of 50.0-59.9 in adult ([place]) 03/05/2007  . Essential hypertension 03/05/2007    Past Medical History: Past Medical History:  Diagnosis Date  . COLON POLYP 02/26/2007   Qualifier: Diagnosis of  By: Erling Cruz  MD, MELISSA    . Complication of anesthesia   . Diabetes mellitus   . GERD (gastroesophageal reflux disease)   . Heart murmur    last 2D Echo -03/31/08  . HIATAL HERNIA WITH REFLUX 09/29/2006   Qualifier: Diagnosis of  By: Erling Cruz  MD, MELISSA    . HIP REPLACEMENT, BILATERAL, HX OF 05/12/2007   Qualifier: Diagnosis of  By: Erling Cruz  MD, MELISSA    . History of blood transfusion   . Hypertension   . OSTEOARTHRITIS 03/05/2007   Qualifier: Diagnosis of  By: Erling Cruz  MD, MELISSA    . PONV (postoperative nausea and vomiting)   . Prostate cancer ([place])   . SBO (small bowel obstruction) ([place]) 02/2017  . Seasonal allergies   . VENOUS INSUFFICIENCY, CHRONIC 02/26/2007   Qualifier: Diagnosis of  By: Erling Cruz  MD, MELISSA      Past Surgical History: Past Surgical History:  Procedure Laterality Date  . COLONOSCOPY    .  COLONOSCOPY    . HIP ARTHROPLASTY     bil  . I&D EXTREMITY  08/14/2012   Procedure: IRRIGATION AND DEBRIDEMENT EXTREMITY;  Surgeon: Newt Minion, MD;  Location: [facility];  Service: Orthopedics;  Laterality: Right;  Irrigation and Debridement Right tibia, Placement antibiotic beads  . I&D EXTREMITY Left 03/18/2014   Procedure: IRRIGATION AND DEBRIDEMENT EXTREMITY;  Surgeon: Newt Minion, MD;  Location: [facility];  Service: Orthopedics;  Laterality: Left;  Debridement Left Calf Ulcer, Apply Theraskin and Wound VAC  . IRRIGATION AND DEBRIDEMENT ABSCESS Left 03/18/2014   DR DUDA  . LEG SURGERY     ROD for fracture  . LEG WOUND REPAIR / CLOSURE     Beads .  Skin wound  . SKIN GRAFT     right leg- from donor skin  . SKIN GRAFT  1976   R wrist   skin  graft from left thigh  . UPPER GASTROINTESTINAL ENDOSCOPY    . WISDOM TOOTH EXTRACTION    . WRIST FUSION     right wrist    Social History: Social History   Tobacco Use  . Smoking status: Never Smoker  . Smokeless tobacco: Never Used  Substance Use Topics  . Alcohol use: No  . Drug use: No   Additional social history:  Please also refer to relevant sections of EMR.  Family History: Family History  Problem Relation Age of Onset  . Hypertension Mother     Allergies and Medications: Allergies  Allergen Reactions  . Ace Inhibitors Cough    With Lisinopril.  . Angiotensin Receptor Blockers Other (See Comments)    Caused excessive weight gain  . Metformin Diarrhea   No current facility-administered medications on file prior to encounter.    Current Outpatient Medications on File Prior to Encounter  Medication Sig Dispense Refill  . ammonium lactate (LAC-HYDRIN) 12 % lotion Apply topically every morning. 400 g 0  . aspirin 81 MG chewable tablet Chew 81 mg by mouth every other day.     Marland Kitchen EASY TOUCH PEN NEEDLES 31G X 8 MM MISC USE WITH FLEXPEN AS DIRECTED 100 each 7  . esomeprazole (NEXIUM) 40 MG capsule TAKE 1 CAPSULE BY MOUTH EVERY DAY 60 capsule 0  . FLOVENT HFA 220 MCG/ACT inhaler INHALE 1 PUFF INTO THE LUNGS TWICE DAILY 36 g 1  . fluticasone (CUTIVATE) 0.05 % cream Apply 1 application topically 2 (two) times daily. Reported on 05/23/2016    . furosemide (LASIX) 40 MG tablet TAKE 1 TABLET BY MOUTH TWICE DAILY. 60 tablet 2  . Insulin Detemir (LEVEMIR FLEXTOUCH) 100 UNIT/ML Pen Inject 20 Units into the skin daily. 15 mL 11  . Lancet Devices (ONE TOUCH DELICA LANCING DEV) MISC 75 Units by Does not apply route 2 (two) times daily. 1 each 0  . latanoprost (XALATAN) 0.005 % ophthalmic solution INSTILL 1 DROP IN BOTH EYES EVERY EVENING FOR GLAUCOMA 2.5 mL 2  . mometasone (NASONEX) 50 MCG/ACT nasal spray INSTILL 2 SPRAYS INTO THE NOSE DAILY 17 g 2  . nystatin (MYCOSTATIN/NYSTOP)  powder Apply topically 2 (two) times daily. 15 g 0  . ONE TOUCH ULTRA TEST test strip USE TO CHECK BLOOD SUGAR FOUR TIMES A DAY 100 each 11  . ONE TOUCH ULTRA TEST test strip USE TO TEST BLOOD SUGAR TWICE DAILY 100 each 10  . ONETOUCH DELICA LANCETS 51O MISC USE TO CHECK BLOOD SUGAR TWICE DAILY. 200 each 9  . pravastatin (PRAVACHOL) 40 MG tablet TAKE  1 TABLET BY MOUTH DAILY 90 tablet 3  . PROAIR HFA 108 (90 Base) MCG/ACT inhaler INHALE 2 PUFFS BY MOUTH EVERY 4 HOURS AS NEEDED FOR WHEEZING OR SHORTNESS OF BREATH 8.5 each 2  . tamsulosin (FLOMAX) 0.4 MG CAPS capsule TAKE 2 CAPSULES BY MOUTH EVERY MORNING 60 capsule 0  . traMADol (ULTRAM) 50 MG tablet TAKE 1 TABLET BY MOUTH 2 TIMES A DAY AS NEEDED FOR PAIN 40 tablet 0  . triamcinolone ointment (KENALOG) 0.1 % APPLY TO AFFECTED AREA TWICE DAILY AS NEEDED 80 g 0  . triamterene-hydrochlorothiazide (MAXZIDE-25) 37.5-25 MG tablet TAKE 1 TABLET BY MOUTH EVERY DAY FOR BLOOD PRESSURE. 30 tablet 2    Objective: BP 113/78   Pulse 85   Temp 98.5 F (36.9 C) (Oral)   Resp (!) 25   Ht 6' (1.829 m)   Wt (!) 315 lb (142.9 kg)   SpO2 100%   BMI 42.72 kg/m  Exam: Physical Exam  Constitutional: He is well-developed, well-nourished, and in no distress.  Currently on 3 Liters   HENT:  Head: Normocephalic and atraumatic.  Mouth/Throat: No oropharyngeal exudate.  Dry mucous membranes   Eyes: Pupils are equal, round, and reactive to light. Conjunctivae are normal.  Neck: Normal range of motion. Neck supple.  Cardiovascular: Normal rate and regular rhythm.  Systolic murmur   Pulmonary/Chest: Effort normal and breath sounds normal.  Abdominal: Soft. Bowel sounds are normal.  Musculoskeletal: Normal range of motion.  Bilateral legs with elephantiasis, papillomatosis skin  Neurological: He is alert.  Skin: Skin is warm.  Bed bug bites throughout    Labs and Imaging: CBC BMET  Recent Labs  Lab 06/04/18 0304 06/04/18 0735  WBC 10.8*  --   HGB 11.7*  11.6*  HCT 38.2* 34.0*  PLT 358  --    Recent Labs  Lab 06/01/18 0340 06/04/18 0735  NA 136 137  K 4.2 5.0  CL 99* 99*  CO2 29  --   BUN 28* 46*  CREATININE 1.19 2.70*  GLUCOSE 132* 177*  CALCIUM 9.3  --       Dg Chest 2 View  Result Date: 06/04/2018 CLINICAL DATA:  Acute onset of shortness of breath. EXAM: CHEST - 2 VIEW COMPARISON:  Chest radiograph performed 05/19/2018 FINDINGS: There is mild elevation of the left hemidiaphragm. Mild left basilar airspace opacity may reflect atelectasis. A small left pleural effusion is suspected. No pneumothorax is seen. The heart is normal in size. No acute osseous abnormalities are identified. There is chronic flattening of the left glenoid, with associated degenerative change at the left humeral head. IMPRESSION: Mild elevation of the left hemidiaphragm. Mild left basilar airspace opacity may reflect atelectasis. Suspect small left pleural effusion. Electronically Signed   By: Garald Balding M.D.   On: 06/04/2018 05:22   Ct Angio Chest Pe W And/or Wo Contrast  Result Date: 06/04/2018 CLINICAL DATA:  Acute onset of shortness of breath. EXAM: CT ANGIOGRAPHY CHEST WITH CONTRAST TECHNIQUE: Multidetector CT imaging of the chest was performed using the standard protocol during bolus administration of intravenous contrast. Multiplanar CT image reconstructions and MIPs were obtained to evaluate the vascular anatomy. CONTRAST:  73mL ISOVUE-370 IOPAMIDOL (ISOVUE-370) INJECTION 76% COMPARISON:  Chest radiograph performed earlier today at 4:57 a.m., and CTA of the chest performed 05/19/2018 FINDINGS: Cardiovascular:  There is no evidence of pulmonary embolus. There is aneurysmal dilatation of the ascending thoracic aorta to 4.6 cm in AP dimension, and mild calcification along the thoracic aorta and  proximal great vessels. The heart is normal in size. Scattered coronary artery calcifications are seen. Mediastinum/Nodes: The mediastinum is otherwise unremarkable. No  mediastinal lymphadenopathy is seen. No pericardial effusion is identified. The thyroid gland is unremarkable. No axillary lymphadenopathy is seen. Lungs/Pleura: Left lower lobe opacity is compatible with pneumonia. Minimal opacity is noted at the right lung base. No pleural effusion or pneumothorax is seen. No dominant mass is identified. Upper Abdomen: The visualized portions of the liver and spleen are unremarkable. Musculoskeletal: No acute osseous abnormalities are identified. The visualized musculature is unremarkable in appearance. Mild bilateral gynecomastia is noted. Review of the MIP images confirms the above findings. IMPRESSION: 1. No evidence of pulmonary embolus. 2. Left lower lobe pneumonia noted. Minimal opacity at the right lung base. 3. Aneurysmal dilatation of the ascending thoracic aorta to 4.6 cm in AP dimension. Ascending thoracic aortic aneurysm. Recommend semi-annual imaging followup by CTA or MRA and referral to cardiothoracic surgery if not already obtained. This recommendation follows 2010 ACCF/AHA/AATS/ACR/ASA/SCA/SCAI/SIR/STS/SVM Guidelines for the Diagnosis and Management of Patients With Thoracic Aortic Disease. Circulation. 2010; 121: G500-B704 4. Scattered coronary artery calcifications seen. 5. Mild bilateral gynecomastia noted. Electronically Signed   By: Garald Balding M.D.   On: 06/04/2018 06:21    Tonette Bihari, MD 06/04/2018, 8:41 AM PGY-3, [place] Intern pager: [phone], text pages welcome

## 2018-06-04 NOTE — ED Triage Notes (Signed)
BIB EMS from home, called out for SOB. Upon EMS arrival pt O2 tank was empty. Pt also states he wanted to come to hospital because his air conditioning was out. Pt is bed bound, multiple bed bugs present on pt.

## 2018-06-04 NOTE — Patient Outreach (Signed)
[facility]) Care Management  06/04/2018  HAEDEN HUDOCK 06/21/1941 [id number]   CSW made an initial attempt to try and contact patient today to perform the phone assessment, as well as assess and assist with social work needs and services, without success.  A HIPAA compliant message was left for patient on voicemail.  CSW is currently awaiting a return call.  CSW then noted that patient presented to the Emergency Department at [facility] this morning for Shortness of Breath.  After thorough review of patient's EMR (Electronic Medical Record) in Epic, [place] learned that patient wanted to return to the hospital because his portable oxygen tank was empty, his air conditioning system is not working in the home and he is completely bed bound and covered in bed bugs.   CSW sent an In [employer] to [employer] and [place], also with [facility], encouraging them to speak with patient about possible placement into a skilled nursing facility for rehabilitative services, upon discharge from the hospital.  CSW is willing to assist with this process and will make contact with the inpatient hospital social worker, once one has been assigned.  CSW will then follow patient once he is back into the community to assess and assist with discharge planning needs and services. Nat Christen, BSW, MSW, LCSW  Licensed Education officer, [facility]  Mailing [place] [address], [place], [place] [address] Physical Address-[address], [place],  [address] Toll Free Main # [phone] Fax # [phone] Cell # [phone]  Office # [phone] [email or web address]

## 2018-06-04 NOTE — ED Notes (Signed)
Difficulty with starting IV. Attempted by 2 nurses. IV team consulted

## 2018-06-04 NOTE — ED Provider Notes (Signed)
[facility] EMERGENCY DEPARTMENT Provider Note   CSN: [id number] Arrival date & time: 06/04/18  0244     History   Chief Complaint Chief Complaint  Patient presents with  . Shortness of Breath    HPI [name] is a 77 y.o. male.  The history is provided by the patient. No language interpreter was used.  Shortness of Breath  This is a recurrent problem. The average episode lasts 1 day. The problem occurs continuously.The current episode started yesterday. Associated symptoms include cough. Pertinent negatives include no fever, no headaches, no coryza, no rhinorrhea, no sore throat, no swollen glands, no ear pain, no neck pain, no sputum production, no hemoptysis, no wheezing, no PND, no orthopnea, no chest pain, no syncope, no vomiting, no abdominal pain, no rash, no leg pain, no leg swelling and no claudication. It is unknown what precipitated the problem. He has tried nothing (no air conditioning O2 is out) for the symptoms. The treatment provided no relief. He has had prior hospitalizations. Associated medical issues do not include DVT.    Past Medical History:  Diagnosis Date  . COLON POLYP 02/26/2007   Qualifier: Diagnosis of  By: Erling Cruz  MD, MELISSA    . Complication of anesthesia   . Diabetes mellitus   . GERD (gastroesophageal reflux disease)   . Heart murmur    last 2D Echo -03/31/08  . HIATAL HERNIA WITH REFLUX 09/29/2006   Qualifier: Diagnosis of  By: Erling Cruz  MD, MELISSA    . HIP REPLACEMENT, BILATERAL, HX OF 05/12/2007   Qualifier: Diagnosis of  By: Erling Cruz  MD, MELISSA    . History of blood transfusion   . Hypertension   . OSTEOARTHRITIS 03/05/2007   Qualifier: Diagnosis of  By: Erling Cruz  MD, MELISSA    . PONV (postoperative nausea and vomiting)   . Prostate cancer ([place])   . SBO (small bowel obstruction) ([place]) 02/2017  . Seasonal allergies   . VENOUS INSUFFICIENCY, CHRONIC 02/26/2007   Qualifier: Diagnosis of  By: Erling Cruz  MD, MELISSA      Patient Active  Problem List   Diagnosis Date Noted  . Hypotension   . Acute on chronic combined systolic and diastolic congestive heart failure ([place])   . Fluid overload 05/19/2018  . Elephantiasis nostra verrucosa 05/19/2018  . Pressure ulcer of upper thigh, right, stage III ([place]) 05/19/2018  . Intertrigo 05/19/2018  . Chronic venous insufficiency, Bilateral Legs 05/19/2018  . Elevated PSA, greater than or equal to 20 ng/ml 05/19/2018  . History of prostate surgery, Per Patient report, [facility] 05/19/2018  . Infestation by bed bug 05/19/2018  . Lives alone with limited help 05/19/2018  . Morbid obesity ([place]) 05/19/2018  . Dyspnea and respiratory abnormality 05/19/2018  . Systolic ejection murmur with radiation into Cartoids bilaterally 05/19/2018  . Paget's bone disease 05/19/2018  . Pulmonary hypertension ([place]) 05/19/2018  . Hypoxia   . AKI (acute kidney injury) ([place])   . Elephantiasis nostras 03/29/2016  . Impaired mobility 09/09/2014  . Diabetic peripheral neuropathy associated with type 2 diabetes mellitus ([place]) 02/07/2014  . Ulcer of left lower leg ([place]) 10/05/2012  . Anemia 05/21/2012  . Chronic leg pain 11/17/2011  . Chronic osteomyelitis of lower leg ([place]) 11/20/2010  . HLD (hyperlipidemia) 12/01/2008  . ADENOCARCINOMA, PROSTATE 05/24/2008  . DM type 2, uncontrolled, with neuropathy ([place]) 01/20/2008  . Morbid obesity with body mass index of 50.0-59.9 in adult ([place]) 03/05/2007  . Essential hypertension 03/05/2007  Past Surgical History:  Procedure Laterality Date  . COLONOSCOPY    . COLONOSCOPY    . HIP ARTHROPLASTY     bil  . I&D EXTREMITY  08/14/2012   Procedure: IRRIGATION AND DEBRIDEMENT EXTREMITY;  Surgeon: Newt Minion, MD;  Location: [facility];  Service: Orthopedics;  Laterality: Right;  Irrigation and Debridement Right tibia, Placement antibiotic beads  . I&D EXTREMITY Left 03/18/2014   Procedure: IRRIGATION AND DEBRIDEMENT EXTREMITY;  Surgeon: Newt Minion, MD;   Location: [facility];  Service: Orthopedics;  Laterality: Left;  Debridement Left Calf Ulcer, Apply Theraskin and Wound VAC  . IRRIGATION AND DEBRIDEMENT ABSCESS Left 03/18/2014   DR DUDA  . LEG SURGERY     ROD for fracture  . LEG WOUND REPAIR / CLOSURE     Beads .  Skin wound  . SKIN GRAFT     right leg- from donor skin  . SKIN GRAFT  1976   R wrist   skin graft from left thigh  . UPPER GASTROINTESTINAL ENDOSCOPY    . WISDOM TOOTH EXTRACTION    . WRIST FUSION     right wrist        Home Medications    Prior to Admission medications   Medication Sig Start Date End Date Taking? Authorizing Provider  ammonium lactate (LAC-HYDRIN) 12 % lotion Apply topically every morning. 05/24/18   Rogue Bussing, MD  aspirin 81 MG chewable tablet Chew 81 mg by mouth every other day.     [provider]  EASY TOUCH PEN NEEDLES 31G X 8 MM MISC USE WITH FLEXPEN AS DIRECTED 01/23/18   Mikell, Jeani Sow, MD  esomeprazole (NEXIUM) 40 MG capsule TAKE 1 CAPSULE BY MOUTH EVERY DAY 03/19/18   Mikell, Jeani Sow, MD  FLOVENT HFA 220 MCG/ACT inhaler INHALE 1 PUFF INTO THE LUNGS TWICE DAILY 10/02/17   Mikell, Jeani Sow, MD  fluticasone (CUTIVATE) 0.05 % cream Apply 1 application topically 2 (two) times daily. Reported on 05/23/2016    [provider]  furosemide (LASIX) 40 MG tablet TAKE 1 TABLET BY MOUTH TWICE DAILY. 04/18/18   Mikell, Jeani Sow, MD  Insulin Detemir (LEVEMIR FLEXTOUCH) 100 UNIT/ML Pen Inject 20 Units into the skin daily. 05/23/18   Rogue Bussing, MD  Lancet Devices (ONE TOUCH DELICA LANCING DEV) MISC 75 Units by Does not apply route 2 (two) times daily. 04/11/16   Rosemarie Ax, MD  latanoprost (XALATAN) 0.005 % ophthalmic solution INSTILL 1 DROP IN BOTH EYES EVERY EVENING FOR GLAUCOMA 02/19/18   Mikell, Jeani Sow, MD  mometasone (NASONEX) 50 MCG/ACT nasal spray INSTILL 2 SPRAYS INTO THE NOSE DAILY 03/19/18   Mikell, Jeani Sow, MD  nystatin  (MYCOSTATIN/NYSTOP) powder Apply topically 2 (two) times daily. 05/23/18   Rogue Bussing, MD  ONE [facility] ULTRA TEST test strip USE TO CHECK BLOOD SUGAR FOUR TIMES A DAY 09/22/15   Rosemarie Ax, MD  ONE [facility] ULTRA TEST test strip USE TO TEST BLOOD SUGAR TWICE DAILY 02/19/18   Mikell, Jeani Sow, MD  [facility] DELICA LANCETS 62I [place] USE TO CHECK BLOOD SUGAR TWICE DAILY. 08/07/17   Mikell, Jeani Sow, MD  pravastatin (PRAVACHOL) 40 MG tablet TAKE 1 TABLET BY MOUTH DAILY 12/26/17   Mikell, Jeani Sow, MD  PROAIR HFA 108 (90 Base) MCG/ACT inhaler INHALE 2 PUFFS BY MOUTH EVERY 4 HOURS AS NEEDED FOR WHEEZING OR SHORTNESS OF BREATH 04/18/18   Mikell, Jeani Sow, MD  tamsulosin (FLOMAX) 0.4 MG  CAPS capsule TAKE 2 CAPSULES BY MOUTH EVERY MORNING 04/18/18   Mikell, Jeani Sow, MD  traMADol (ULTRAM) 50 MG tablet TAKE 1 TABLET BY MOUTH 2 TIMES A DAY AS NEEDED FOR PAIN 05/21/18   Newt Minion, MD  triamcinolone ointment (KENALOG) 0.1 % APPLY TO AFFECTED AREA TWICE DAILY AS NEEDED 05/21/18   Newt Minion, MD  triamterene-hydrochlorothiazide (POEUMPN-36) 37.5-25 MG tablet TAKE 1 TABLET BY MOUTH EVERY DAY FOR BLOOD PRESSURE. 04/18/18   Mikell, Jeani Sow, MD    Family History Family History  Problem Relation Age of Onset  . Hypertension Mother     Social History Social History   Tobacco Use  . Smoking status: Never Smoker  . Smokeless tobacco: Never Used  Substance Use Topics  . Alcohol use: No  . Drug use: No     Allergies   Ace inhibitors; Angiotensin receptor blockers; and Metformin   Review of Systems Review of Systems  Constitutional: Negative for fever.  HENT: Negative for ear pain, rhinorrhea and sore throat.   Respiratory: Positive for cough and shortness of breath. Negative for hemoptysis, sputum production and wheezing.   Cardiovascular: Negative for chest pain, orthopnea, claudication, leg swelling, syncope and PND.  Gastrointestinal: Negative for abdominal  pain and vomiting.  Musculoskeletal: Negative for neck pain.  Skin: Negative for rash.  Neurological: Negative for headaches.     Physical Exam Updated Vital Signs BP 113/78   Pulse 85   Temp 98.5 F (36.9 C) (Oral)   Resp (!) 25   Ht 6' (1.829 m)   Wt (!) 142.9 kg (315 lb)   SpO2 100%   BMI 42.72 kg/m   Physical Exam  Constitutional: He is oriented to person, place, and time. He appears well-developed and well-nourished. No distress.  HENT:  Head: Normocephalic and atraumatic.  Mouth/Throat: No oropharyngeal exudate.  Eyes: Pupils are equal, round, and reactive to light. Conjunctivae are normal.  Neck: Normal range of motion. No JVD present.  Cardiovascular: Normal rate, regular rhythm, normal heart sounds and intact distal pulses.  Pulmonary/Chest: Effort normal and breath sounds normal. Tachypnea noted. No respiratory distress.  Abdominal: Soft. Bowel sounds are normal. He exhibits no mass. There is no tenderness. There is no rebound and no guarding.  Musculoskeletal: He exhibits edema.  Neurological: He is alert and oriented to person, place, and time. He displays normal reflexes.  Skin: Skin is warm and dry. Capillary refill takes less than 2 seconds.  Venous stasis, verrucous elephantiasis   Psychiatric: He has a normal mood and affect.     ED Treatments / Results  Labs (all labs ordered are listed, but only abnormal results are displayed) Results for orders placed or performed during the hospital encounter of 06/04/18  CBC with Differential/Platelet  Result Value Ref Range   WBC 10.8 (H) 4.0 - 10.5 K/uL   RBC 4.66 4.22 - 5.81 MIL/uL   Hemoglobin 11.7 (L) 13.0 - 17.0 g/dL   HCT 38.2 (L) 39.0 - 52.0 %   MCV 82.0 78.0 - 100.0 fL   MCH 25.1 (L) 26.0 - 34.0 pg   MCHC 30.6 30.0 - 36.0 g/dL   RDW 14.9 11.5 - 15.5 %   Platelets 358 150 - 400 K/uL   Neutrophils Relative % 77 %   Neutro Abs 8.5 (H) 1.7 - 7.7 K/uL   Lymphocytes Relative 12 %   Lymphs Abs 1.2 0.7 -  4.0 K/uL   Monocytes Relative 8 %   Monocytes Absolute 0.9  0.1 - 1.0 K/uL   Eosinophils Relative 1 %   Eosinophils Absolute 0.1 0.0 - 0.7 K/uL   Basophils Relative 1 %   Basophils Absolute 0.1 0.0 - 0.1 K/uL   Immature Granulocytes 1 %   Abs Immature Granulocytes 0.1 0.0 - 0.1 K/uL  Brain natriuretic peptide  Result Value Ref Range   B Natriuretic Peptide 29.8 0.0 - 100.0 pg/mL  I-Stat Chem 8, ED  Result Value Ref Range   Sodium 137 135 - 145 mmol/L   Potassium 5.0 3.5 - 5.1 mmol/L   Chloride 99 (L) 101 - 111 mmol/L   BUN 46 (H) 6 - 20 mg/dL   Creatinine, Ser 2.70 (H) 0.61 - 1.24 mg/dL   Glucose, Bld 177 (H) 65 - 99 mg/dL   Calcium, Ion 1.16 1.15 - 1.40 mmol/L   TCO2 28 22 - 32 mmol/L   Hemoglobin 11.6 (L) 13.0 - 17.0 g/dL   HCT 34.0 (L) 39.0 - 52.0 %  I-stat troponin, ED  Result Value Ref Range   Troponin i, poc 0.01 0.00 - 0.08 ng/mL   Comment 3           Dg Chest 2 View  Result Date: 06/04/2018 CLINICAL DATA:  Acute onset of shortness of breath. EXAM: CHEST - 2 VIEW COMPARISON:  Chest radiograph performed 05/19/2018 FINDINGS: There is mild elevation of the left hemidiaphragm. Mild left basilar airspace opacity may reflect atelectasis. A small left pleural effusion is suspected. No pneumothorax is seen. The heart is normal in size. No acute osseous abnormalities are identified. There is chronic flattening of the left glenoid, with associated degenerative change at the left humeral head. IMPRESSION: Mild elevation of the left hemidiaphragm. Mild left basilar airspace opacity may reflect atelectasis. Suspect small left pleural effusion. Electronically Signed   By: Garald Balding M.D.   On: 06/04/2018 05:22   Ct Angio Chest Pe W And/or Wo Contrast  Result Date: 06/04/2018 CLINICAL DATA:  Acute onset of shortness of breath. EXAM: CT ANGIOGRAPHY CHEST WITH CONTRAST TECHNIQUE: Multidetector CT imaging of the chest was performed using the standard protocol during bolus administration of  intravenous contrast. Multiplanar CT image reconstructions and MIPs were obtained to evaluate the vascular anatomy. CONTRAST:  48mL ISOVUE-370 IOPAMIDOL (ISOVUE-370) INJECTION 76% COMPARISON:  Chest radiograph performed earlier today at 4:57 a.m., and CTA of the chest performed 05/19/2018 FINDINGS: Cardiovascular:  There is no evidence of pulmonary embolus. There is aneurysmal dilatation of the ascending thoracic aorta to 4.6 cm in AP dimension, and mild calcification along the thoracic aorta and proximal great vessels. The heart is normal in size. Scattered coronary artery calcifications are seen. Mediastinum/Nodes: The mediastinum is otherwise unremarkable. No mediastinal lymphadenopathy is seen. No pericardial effusion is identified. The thyroid gland is unremarkable. No axillary lymphadenopathy is seen. Lungs/Pleura: Left lower lobe opacity is compatible with pneumonia. Minimal opacity is noted at the right lung base. No pleural effusion or pneumothorax is seen. No dominant mass is identified. Upper Abdomen: The visualized portions of the liver and spleen are unremarkable. Musculoskeletal: No acute osseous abnormalities are identified. The visualized musculature is unremarkable in appearance. Mild bilateral gynecomastia is noted. Review of the MIP images confirms the above findings. IMPRESSION: 1. No evidence of pulmonary embolus. 2. Left lower lobe pneumonia noted. Minimal opacity at the right lung base. 3. Aneurysmal dilatation of the ascending thoracic aorta to 4.6 cm in AP dimension. Ascending thoracic aortic aneurysm. Recommend semi-annual imaging followup by CTA or MRA and referral to  cardiothoracic surgery if not already obtained. This recommendation follows 2010 ACCF/AHA/AATS/ACR/ASA/SCA/SCAI/SIR/STS/SVM Guidelines for the Diagnosis and Management of Patients With Thoracic Aortic Disease. Circulation. 2010; 121: W431-V400 4. Scattered coronary artery calcifications seen. 5. Mild bilateral gynecomastia  noted. Electronically Signed   By: Garald Balding M.D.   On: 06/04/2018 06:21   Ct Angio Chest Pe W Or Wo Contrast  Result Date: 05/19/2018 CLINICAL DATA:  Increase shortness of breath with lower extremity edema. EXAM: CT ANGIOGRAPHY CHEST CT ABDOMEN AND PELVIS WITH CONTRAST TECHNIQUE: Multidetector CT imaging of the chest was performed using the standard protocol during bolus administration of intravenous contrast. Multiplanar CT image reconstructions and MIPs were obtained to evaluate the vascular anatomy. Multidetector CT imaging of the abdomen and pelvis was performed using the standard protocol during bolus administration of intravenous contrast. CONTRAST:  163mL ISOVUE-370 IOPAMIDOL (ISOVUE-370) INJECTION 76% COMPARISON:  Abdomen and pelvis CT 03/14/2017 FINDINGS: CTA CHEST FINDINGS Cardiovascular: Heart is enlarged. No pericardial effusion. Coronary artery calcification is evident. Atherosclerotic calcification is noted in the wall of the thoracic aorta. Prominence of the main pulmonary arteries suggests pulmonary arterial hypertension. No filling defects identified within the opacified pulmonary arteries. Mediastinum/Nodes: No mediastinal lymphadenopathy. There is no hilar lymphadenopathy. The esophagus has normal imaging features. There is no axillary lymphadenopathy. Lungs/Pleura: The central tracheobronchial airways are patent. Atelectasis or scarring noted in the posterior lower lobes bilaterally. No pulmonary edema or pleural effusion. Musculoskeletal: Bone windows reveal no worrisome lytic or sclerotic osseous lesions. Bilateral gynecomastia evident. Review of the MIP images confirms the above findings. CT ABDOMEN and PELVIS FINDINGS Hepatobiliary: No focal abnormality within the liver parenchyma. Calcified gallstones evident. No intrahepatic or extrahepatic biliary dilation. Pancreas: No focal mass lesion. No dilatation of the main duct. No intraparenchymal cyst. No peripancreatic edema. Spleen:  No splenomegaly. No focal mass lesion. Adrenals/Urinary Tract: No adrenal nodule or mass. Kidneys unremarkable. Proximal and mid ureters show no dilatation. Distal ureters obscured by bilateral hip replacement. Posterior bladder obscured by streak artifact from posterior hip replacement. Stomach/Bowel: Fundal diverticulum noted in the stomach. Stomach otherwise unremarkable. Duodenum is normally positioned as is the ligament of Treitz. Duodenal diverticulum evident. No small bowel wall thickening. No small bowel dilatation. The terminal ileum is normal. The appendix is not visualized, but there is no edema or inflammation in the region of the cecum. Diverticular changes are noted in the left colon. A portion of the sigmoid segment is obscured by streak artifact but no pericolonic edema or inflammation within the visualized portions of the colon. Vascular/Lymphatic: There is abdominal aortic atherosclerosis without aneurysm. There is no gastrohepatic or hepatoduodenal ligament lymphadenopathy. No intraperitoneal or retroperitoneal lymphadenopathy. 12 mm short axis left external iliac lymph node is stable since prior study. Similar right external iliac node also unchanged. No groin lymphadenopathy. Reproductive: Portions of the prostate gland obscured by streak artifact. Other: No intraperitoneal free fluid. Musculoskeletal: Small umbilical hernia contains only fat. Patient is status post bilateral hip replacement with stable appearance of cortical thickening and trabecular coarsening in the bony pelvis suggesting Paget's disease. Review of the MIP images confirms the above findings. IMPRESSION: 1. No CT evidence for acute pulmonary embolus. 2. Mild chronic atelectasis or scarring in the dependent lower lungs bilaterally. No other findings to explain the patient's history of shortness of breath. 3. Cholelithiasis. 4.  Aortic Atherosclerois (ICD10-170.0) 5. Gynecomastia. Electronically Signed   By: Misty Stanley M.D.    On: 05/19/2018 13:21   US Soft Tissue Head & Neck (non-thyroid)  Result Date: 05/19/2018 CLINICAL DATA:  77 year old male with a history of soft tissue mass EXAM: ULTRASOUND OF HEAD/NECK SOFT TISSUES TECHNIQUE: Ultrasound examination of the head and neck soft tissues was performed in the area of clinical concern. COMPARISON:  None. FINDINGS: Grayscale and color duplex ultrasound performed in the region clinical concern. There is a homogeneously echo act soft tissue mass with lobulated borders hypoechoic to the adjacent soft tissues. No significant internal flow. IMPRESSION: Nonspecific soft tissue mass in the region of clinical concern. Malignancy not excluded, and further evaluation with either contrast-enhanced CT or contrast-enhanced MR recommended. Electronically Signed   By: Corrie Mckusick D.O.   On: 05/19/2018 14:07   Ct Abdomen Pelvis W Contrast  Result Date: 05/19/2018 CLINICAL DATA:  Increase shortness of breath with lower extremity edema. EXAM: CT ANGIOGRAPHY CHEST CT ABDOMEN AND PELVIS WITH CONTRAST TECHNIQUE: Multidetector CT imaging of the chest was performed using the standard protocol during bolus administration of intravenous contrast. Multiplanar CT image reconstructions and MIPs were obtained to evaluate the vascular anatomy. Multidetector CT imaging of the abdomen and pelvis was performed using the standard protocol during bolus administration of intravenous contrast. CONTRAST:  164mL ISOVUE-370 IOPAMIDOL (ISOVUE-370) INJECTION 76% COMPARISON:  Abdomen and pelvis CT 03/14/2017 FINDINGS: CTA CHEST FINDINGS Cardiovascular: Heart is enlarged. No pericardial effusion. Coronary artery calcification is evident. Atherosclerotic calcification is noted in the wall of the thoracic aorta. Prominence of the main pulmonary arteries suggests pulmonary arterial hypertension. No filling defects identified within the opacified pulmonary arteries. Mediastinum/Nodes: No mediastinal lymphadenopathy. There is  no hilar lymphadenopathy. The esophagus has normal imaging features. There is no axillary lymphadenopathy. Lungs/Pleura: The central tracheobronchial airways are patent. Atelectasis or scarring noted in the posterior lower lobes bilaterally. No pulmonary edema or pleural effusion. Musculoskeletal: Bone windows reveal no worrisome lytic or sclerotic osseous lesions. Bilateral gynecomastia evident. Review of the MIP images confirms the above findings. CT ABDOMEN and PELVIS FINDINGS Hepatobiliary: No focal abnormality within the liver parenchyma. Calcified gallstones evident. No intrahepatic or extrahepatic biliary dilation. Pancreas: No focal mass lesion. No dilatation of the main duct. No intraparenchymal cyst. No peripancreatic edema. Spleen: No splenomegaly. No focal mass lesion. Adrenals/Urinary Tract: No adrenal nodule or mass. Kidneys unremarkable. Proximal and mid ureters show no dilatation. Distal ureters obscured by bilateral hip replacement. Posterior bladder obscured by streak artifact from posterior hip replacement. Stomach/Bowel: Fundal diverticulum noted in the stomach. Stomach otherwise unremarkable. Duodenum is normally positioned as is the ligament of Treitz. Duodenal diverticulum evident. No small bowel wall thickening. No small bowel dilatation. The terminal ileum is normal. The appendix is not visualized, but there is no edema or inflammation in the region of the cecum. Diverticular changes are noted in the left colon. A portion of the sigmoid segment is obscured by streak artifact but no pericolonic edema or inflammation within the visualized portions of the colon. Vascular/Lymphatic: There is abdominal aortic atherosclerosis without aneurysm. There is no gastrohepatic or hepatoduodenal ligament lymphadenopathy. No intraperitoneal or retroperitoneal lymphadenopathy. 12 mm short axis left external iliac lymph node is stable since prior study. Similar right external iliac node also unchanged. No  groin lymphadenopathy. Reproductive: Portions of the prostate gland obscured by streak artifact. Other: No intraperitoneal free fluid. Musculoskeletal: Small umbilical hernia contains only fat. Patient is status post bilateral hip replacement with stable appearance of cortical thickening and trabecular coarsening in the bony pelvis suggesting Paget's disease. Review of the MIP images confirms the above findings. IMPRESSION: 1. No CT evidence for acute  pulmonary embolus. 2. Mild chronic atelectasis or scarring in the dependent lower lungs bilaterally. No other findings to explain the patient's history of shortness of breath. 3. Cholelithiasis. 4.  Aortic Atherosclerois (ICD10-170.0) 5. Gynecomastia. Electronically Signed   By: Misty Stanley M.D.   On: 05/19/2018 13:21   Dg Chest Port 1 View  Result Date: 05/19/2018 CLINICAL DATA:  Shortness of breath EXAM: PORTABLE CHEST 1 VIEW COMPARISON:  05/19/2018 FINDINGS: Mild cardiomegaly and vascular congestion. Bibasilar atelectasis. No visible significant effusions or acute bony abnormality. IMPRESSION: Cardiomegaly, vascular congestion and bibasilar atelectasis. Electronically Signed   By: Rolm Baptise M.D.   On: 05/19/2018 19:23   Dg Chest Port 1 View  Result Date: 05/19/2018 CLINICAL DATA:  Shortness of breath with cough EXAM: PORTABLE CHEST 1 VIEW COMPARISON:  03/18/2014 FINDINGS: Cardiomegaly with mild central congestion. Minimal atelectasis at the bases. No consolidation or effusion. No pneumothorax. IMPRESSION: 1. Cardiomegaly with minimal central congestion. 2. Mild bibasilar atelectasis without focal airspace consolidation. Electronically Signed   By: Donavan Foil M.D.   On: 05/19/2018 01:29    EKG EKG Interpretation  Date/Time:  Thursday June 04 2018 04:22:53 EDT Ventricular Rate:  96 PR Interval:    QRS Duration: 114 QT Interval:  380 QTC Calculation: 481 R Axis:   -14 Text Interpretation:  Atrial fibrillation Borderline intraventricular  conduction delay Confirmed by Zaneta Lightcap ([id number]) on 06/04/2018 5:23:10 AM Also confirmed by Randal Buba, Tevita Gomer ([id number]), editor Hattie Perch [phone])  on 06/04/2018 6:38:31 AM   Radiology Dg Chest 2 View  Result Date: 06/04/2018 CLINICAL DATA:  Acute onset of shortness of breath. EXAM: CHEST - 2 VIEW COMPARISON:  Chest radiograph performed 05/19/2018 FINDINGS: There is mild elevation of the left hemidiaphragm. Mild left basilar airspace opacity may reflect atelectasis. A small left pleural effusion is suspected. No pneumothorax is seen. The heart is normal in size. No acute osseous abnormalities are identified. There is chronic flattening of the left glenoid, with associated degenerative change at the left humeral head. IMPRESSION: Mild elevation of the left hemidiaphragm. Mild left basilar airspace opacity may reflect atelectasis. Suspect small left pleural effusion. Electronically Signed   By: Garald Balding M.D.   On: 06/04/2018 05:22   Ct Angio Chest Pe W And/or Wo Contrast  Result Date: 06/04/2018 CLINICAL DATA:  Acute onset of shortness of breath. EXAM: CT ANGIOGRAPHY CHEST WITH CONTRAST TECHNIQUE: Multidetector CT imaging of the chest was performed using the standard protocol during bolus administration of intravenous contrast. Multiplanar CT image reconstructions and MIPs were obtained to evaluate the vascular anatomy. CONTRAST:  83mL ISOVUE-370 IOPAMIDOL (ISOVUE-370) INJECTION 76% COMPARISON:  Chest radiograph performed earlier today at 4:57 a.m., and CTA of the chest performed 05/19/2018 FINDINGS: Cardiovascular:  There is no evidence of pulmonary embolus. There is aneurysmal dilatation of the ascending thoracic aorta to 4.6 cm in AP dimension, and mild calcification along the thoracic aorta and proximal great vessels. The heart is normal in size. Scattered coronary artery calcifications are seen. Mediastinum/Nodes: The mediastinum is otherwise unremarkable. No mediastinal lymphadenopathy is seen.  No pericardial effusion is identified. The thyroid gland is unremarkable. No axillary lymphadenopathy is seen. Lungs/Pleura: Left lower lobe opacity is compatible with pneumonia. Minimal opacity is noted at the right lung base. No pleural effusion or pneumothorax is seen. No dominant mass is identified. Upper Abdomen: The visualized portions of the liver and spleen are unremarkable. Musculoskeletal: No acute osseous abnormalities are identified. The visualized musculature is unremarkable in appearance. Mild bilateral gynecomastia is  noted. Review of the MIP images confirms the above findings. IMPRESSION: 1. No evidence of pulmonary embolus. 2. Left lower lobe pneumonia noted. Minimal opacity at the right lung base. 3. Aneurysmal dilatation of the ascending thoracic aorta to 4.6 cm in AP dimension. Ascending thoracic aortic aneurysm. Recommend semi-annual imaging followup by CTA or MRA and referral to cardiothoracic surgery if not already obtained. This recommendation follows 2010 ACCF/AHA/AATS/ACR/ASA/SCA/SCAI/SIR/STS/SVM Guidelines for the Diagnosis and Management of Patients With Thoracic Aortic Disease. Circulation. 2010; 121: O115-B262 4. Scattered coronary artery calcifications seen. 5. Mild bilateral gynecomastia noted. Electronically Signed   By: Garald Balding M.D.   On: 06/04/2018 06:21    Procedures Procedures (including critical care time)  Medications Ordered in ED Medications  sodium chloride 0.9 % bolus 1,000 mL (has no administration in time range)  cefTRIAXone (ROCEPHIN) 1 g in sodium chloride 0.9 % 100 mL IVPB (has no administration in time range)  azithromycin (ZITHROMAX) 500 mg in sodium chloride 0.9 % 250 mL IVPB (has no administration in time range)  sodium chloride 0.9 % bolus 1,000 mL (has no administration in time range)  sodium chloride 0.9 % bolus 1,000 mL (1,000 mLs Intravenous New Bag/Given 06/04/18 0610)  iopamidol (ISOVUE-370) 76 % injection 100 mL (100 mLs Intravenous  Contrast Given 06/04/18 0601)   Will need hydration for several days as was CTed without creatinine and has renal insufficiency  MDM Reviewed: previous chart, nursing note and vitals Reviewed previous: labs Interpretation: ECG, x-ray and labs (No PE on CT new renal insufficiency ) Total time providing critical care: 30-74 minutes. This excludes time spent performing separately reportable procedures and services. Consults: admitting MD  CRITICAL CARE Performed by: Carlisle Beers Total critical care time: 60 minutes Critical care time was exclusive of separately billable procedures and treating other patients. Critical care was necessary to treat or prevent imminent or life-threatening deterioration. Critical care was time spent personally by me on the following activities: development of treatment plan with patient and/or surrogate as well as nursing, discussions with consultants, evaluation of patient's response to treatment, examination of patient, obtaining history from patient or surrogate, ordering and performing treatments and interventions, ordering and review of laboratory studies, ordering and review of radiographic studies, pulse oximetry and re-evaluation of patient's condition.  Safety zoned issue   Concerned about patient's beg bug insufficiency and renal issues conveyed to family practice    Final Clinical Impressions(s) / ED Diagnoses   Final diagnoses:  Community acquired pneumonia of left lower lobe of lung ([place])  Renal insufficiency       Danyle Boening, MD 06/04/18 0355

## 2018-06-04 NOTE — Care Management Note (Signed)
Case Management Note  Patient Details  Name: KARELL TUKES MRN: [id number] Date of Birth: 11/10/1941  Subjective/Objective:   From home alone, recent admit, he is active with [facility] for [facility], PT, OT, aide , also has home oxygen with [facility].  [facility] following also.  Patient has been refusing SNF recommendations.  presents with sob, pna, aki.   Await pt/ot eval. Per ED he still infested with bed bugs.                Action/Plan: NCM will follow for dc needs.   Expected Discharge Date:                  Expected Discharge Plan:  [place]  In-House Referral:  [facility]  Discharge planning Services  CM Consult  Post Acute Care Choice:  Home Health, Resumption of Svcs/PTA Provider Choice offered to:     DME Arranged:    DME Agency:     HH Arranged:  RN, Disease Management, PT, OT, Nurse's Aide [place] Agency:  [place]  Status of Service:  In process, will continue to follow  If discussed at Long Length of Stay Meetings, dates discussed:    Additional Comments:  Zenon Mayo, RN 06/04/2018, 10:28 AM

## 2018-06-05 LAB — CBC
HEMATOCRIT: 33.6 % — AB (ref 39.0–52.0)
HEMOGLOBIN: 10.3 g/dL — AB (ref 13.0–17.0)
MCH: 25.4 pg — ABNORMAL LOW (ref 26.0–34.0)
MCHC: 30.7 g/dL (ref 30.0–36.0)
MCV: 83 fL (ref 78.0–100.0)
Platelets: 263 10*3/uL (ref 150–400)
RBC: 4.05 MIL/uL — ABNORMAL LOW (ref 4.22–5.81)
RDW: 15 % (ref 11.5–15.5)
WBC: 8.1 10*3/uL (ref 4.0–10.5)

## 2018-06-05 LAB — BASIC METABOLIC PANEL
ANION GAP: 6 (ref 5–15)
BUN: 35 mg/dL — ABNORMAL HIGH (ref 6–20)
CHLORIDE: 104 mmol/L (ref 101–111)
CO2: 29 mmol/L (ref 22–32)
Calcium: 8.6 mg/dL — ABNORMAL LOW (ref 8.9–10.3)
Creatinine, Ser: 1.52 mg/dL — ABNORMAL HIGH (ref 0.61–1.24)
GFR calc Af Amer: 49 mL/min — ABNORMAL LOW (ref >60)
GFR, EST NON AFRICAN AMERICAN: 42 mL/min — AB (ref >60)
GLUCOSE: 101 mg/dL — AB (ref 65–99)
POTASSIUM: 4 mmol/L (ref 3.5–5.1)
Sodium: 139 mmol/L (ref 135–145)

## 2018-06-05 LAB — GLUCOSE, CAPILLARY
GLUCOSE-CAPILLARY: 147 mg/dL — AB (ref 65–99)
GLUCOSE-CAPILLARY: 138 mg/dL — AB (ref 65–99)
Glucose-Capillary: 106 mg/dL — ABNORMAL HIGH (ref 65–99)
Glucose-Capillary: 135 mg/dL — ABNORMAL HIGH (ref 65–99)

## 2018-06-05 MED ORDER — FUROSEMIDE 40 MG PO TABS
40.0000 mg | ORAL_TABLET | Freq: Two times a day (BID) | ORAL | Status: DC
  Administered 2018-06-06 – 2018-06-08 (×5): 40 mg via ORAL
  Filled 2018-06-05 (×5): qty 1

## 2018-06-05 MED ORDER — SODIUM CHLORIDE 0.9 % IV SOLN
2.0000 g | INTRAVENOUS | Status: DC
  Administered 2018-06-05 – 2018-06-08 (×4): 2 g via INTRAVENOUS
  Filled 2018-06-05 (×4): qty 2

## 2018-06-05 MED ORDER — TAMSULOSIN HCL 0.4 MG PO CAPS
0.4000 mg | ORAL_CAPSULE | Freq: Every morning | ORAL | Status: DC
  Administered 2018-06-06 – 2018-06-08 (×3): 0.4 mg via ORAL
  Filled 2018-06-05 (×3): qty 1
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
## 2018-06-05 NOTE — Progress Notes (Signed)
Orthopedic Tech Progress Note Patient Details:  [name] May 14, 1941 [id number]  Ortho Devices Type of Ortho Device: [name] boot Ortho Device/Splint Location: Bilateral unna boots    Post Interventions Patient Tolerated: Well Instructions Provided: Care of device   [name] 06/05/2018, 3:20 PM

## 2018-06-05 NOTE — Evaluation (Signed)
Occupational Therapy Evaluation Patient Details Name: [name] MRN: [id number] DOB: 16-Sep-1941 Today's Date: 06/05/2018    History of Present Illness 77 y.o. male presenting with SOB. PMH is significant for prostate adenocarcinoma, anemia, BLE lymphadema, chronic non-healing wounds BLE, T2DM, elephantiasis nostras verrucosa RLE, impaired mobility, morbid obesity, hypertension, HLD Presenting with shortness of breath following a recent discharge from the hospital on 6/3 -CTA positive for left lower lobe pneumonia and slight WBC 10.8.    Clinical Impression   Pt admitted with with the above diagnosis.  Pt currently with functional limitations due to the deficits listed below (see OT Problem List).  Pt will benefit from skilled OT to increase their safety and independence with ADL and functional mobility for ADL to facilitate discharge to venue listed below.   Discussion with pt regarding need for ST SNF and need to get stonger prior to DC home.     Follow Up Recommendations  SNF    Equipment Recommendations  None recommended by OT  OT spoke with RN and nursing sec. Ordering wide BSC for pt as well as wide walker.      Precautions / Restrictions Precautions Precautions: Fall Restrictions Weight Bearing Restrictions: No      Mobility Bed Mobility Overal bed mobility: Needs Assistance Bed Mobility: Supine to Sit     Supine to sit: Supervision;HOB elevated Sit to supine: Mod assist   General bed mobility comments: Assist for LLE into bed.  Transfers Overall transfer level: Needs assistance Equipment used: Rolling walker (2 wheeled) Transfers: Sit to/from Stand Sit to Stand: Min guard;From elevated surface         General transfer comment: standard RW in room and pt able to utilize by placing one leg inside and one leg outside, PT/OT blocked feet from sliding, pt with good powerup and steadying     Balance Overall balance assessment: Needs  assistance Sitting-balance support: Feet supported;No upper extremity supported Sitting balance-Leahy Scale: Good                                     ADL either performed or assessed with clinical judgement   ADL Overall ADL's : Needs assistance/impaired Eating/Feeding: Set up;Sitting   Grooming: Sitting   Upper Body Bathing: Minimal assistance;Sitting   Lower Body Bathing: Bed level;Total assistance   Upper Body Dressing : Minimal assistance;Sitting   Lower Body Dressing: Maximal assistance;Sitting/lateral leans;Cueing for sequencing;Cueing for safety                       Vision Baseline Vision/History: Wears glasses Wears Glasses: At all times Patient Visual Report: No change from baseline           Hand Dominance Right   Extremity/Trunk Assessment Upper Extremity Assessment Upper Extremity Assessment: (P) Defer to OT evaluation LUE Deficits / Details: longstanding shoulder weakness/limitations   Lower Extremity Assessment Lower Extremity Assessment: (P) LLE deficits/detail       Communication Communication Communication: No difficulties   Cognition Arousal/Alertness: Awake/alert Behavior During Therapy: WFL for tasks assessed/performed Overall Cognitive Status: Within Functional Limits for tasks assessed Area of Impairment: Safety/judgement                                   General Comments  VSS, Pt with large amount fo skin flaking off after diuresis last  week,             Home Living Family/patient expects to be discharged to:: Private residence Living Arrangements: Alone Available Help at Discharge: Friend(s);Available PRN/intermittently;Family Type of Home: Apartment Home Access: Elevator     Home Layout: One level     Bathroom Shower/Tub: Other (comment)(sponge bath)   Bathroom Toilet: Handicapped height     Home Equipment: Wheelchair - power;Crutches;Adaptive equipment;Hospital bed Adaptive  Equipment: Reacher Additional Comments: reports the leg rests of his w/c are in disrepair      Prior Functioning/Environment Level of Independence: Independent with assistive device(s)        Comments: transfers independently to motorized w/c, sponge bathes and dresses independently using reacher, friends get groceries, uses SCAT to get to grocery store        OT Problem List: Decreased activity tolerance;Impaired balance (sitting and/or standing);Decreased knowledge of use of DME or AE;Pain;Obesity;Cardiopulmonary status limiting activity      OT Treatment/Interventions: Self-care/ADL training;DME and/or AE instruction;Balance training;Patient/family education;Therapeutic activities    OT Goals(Current goals can be found in the care plan section) Acute Rehab OT Goals Patient Stated Goal: to return home OT Goal Formulation: With patient Time For Goal Achievement: 06/19/18 ADL Goals Pt Will Perform Grooming: with supervision;sitting Pt Will Perform Lower Body Dressing: with min assist;sitting/lateral leans;sit to/from stand Pt Will Transfer to Toilet: with min assist;ambulating Pt Will Perform Toileting - Clothing Manipulation and hygiene: with min assist;sit to/from stand  OT Frequency: Min 2X/week   Barriers to D/C:            Co-evaluation   Reason for Co-Treatment: For patient/therapist safety PT goals addressed during session: Mobility/safety with mobility        AM-PAC PT "6 Clicks" Daily Activity     Outcome Measure Help from another person eating meals?: None Help from another person taking care of personal grooming?: Total Help from another person toileting, which includes using toliet, bedpan, or urinal?: Total Help from another person bathing (including washing, rinsing, drying)?: Total Help from another person to put on and taking off regular upper body clothing?: A Little Help from another person to put on and taking off regular lower body clothing?:  Total 6 Click Score: 11   End of Session Equipment Utilized During Treatment: Rolling walker Nurse Communication: Mobility status  Activity Tolerance: Patient tolerated treatment well Patient left: in bed;with call bell/phone within reach  OT Visit Diagnosis: Unsteadiness on feet (R26.81)                Time: 3762-8315 OT Time Calculation (min): 35 min Charges:  OT General Charges $OT Visit: 1 Visit OT Evaluation $OT Eval Moderate Complexity: 1 Mod G-Codes:     Kari Baars, OT [phone]  Payton Mccallum D 06/05/2018, 1:12 PM

## 2018-06-05 NOTE — Progress Notes (Signed)
Family Medicine Teaching Service Daily Progress Note Intern Pager: [phone]  Patient name: [name] Medical record number: [id number] Date of birth: [birth year] Age: 77 y.o. Gender: male  Primary Care Provider: Tonette Bihari, MD Consultants: None Code Status: Full  Pt Overview and Major Events to Date:  [name] is a 77 y.o. male presenting with SOB. PMH is significant for prostate adenocarcinoma, anemia, BLE lymphadema, chronic non-healing wounds BLE, T2DM,elephantiasis nostras verrucosaRLE, impaired mobility, morbid obesity, hypertension, HLD  Assessment and Plan:   HCAP  Presenting with shortness of breath following a recent discharge from the hospital on 6/3. Now on 3L breathing comfortably. CTA positive for left lower lobe pneumonia and slight WBC 10.8.  PE ruled out with CTA.  BNP 29.8 and remains hypovolemic on exam making CHF unlikely cause. Dry weight of 315 lbs.  -  Discontinue vanc as MRSA PCR negative, continue cefepime for HCAP  -  F/u strep pneumo, Legionella -  Blood cultures pending  -  PT/OT recs pending - Daily CBC until leukocytosis resolves - incentive spirometry  AKI: Acute. 2.52 >1.52 Baseline Cr~1. 1.19 at discharge on 6/3. Likely prerenal etiology secondary to overdiuresis with Lasix for presumed CHF exacerbation and possible iatrogenic from contrast dye given for CT scan. - Restart Furosemide on 6/7 if Cr continues to improve - morning BMP - holding triamterene/hydrochlorothiazide - Hold off on IV fluids for now as he has had adequate p.o. intake  HTN. Stable. Is on HCTZ and Triameterene at home. Has had hypotension at last admissionLikely secondary to over diuresis vs overtreating hypertension. Blood pressure improved to 11/67 this morning, but evening metoprolol was held. SBP past 24hrs has been 94-108 DBP has been 56-69. 108/56 this am.  -Vitals per floor protocol - restart furosemide 40mg  daily on 6/7 - Holding other diuretics and  antihypertensives  HFpEF: 5/23 Echo: grade 2 diastolic dysfunction, LVED 60-65%.  Weight 321 today. Dry weight at discharge on 310lbs; normal dry weight 312-315. Suspect contribution of OSA and OHSgiven morbid obesity, as well.  - Restart Furosemide 6/7 - Daily Weights (dry weight 312-315 likely) - Strict I/O's - monitor BMP  T2DM: Hga1c is 9.3 on last admission.  CBGs range over past 24 hours have been 106-125. Home regimen is Levemir 48 units BID.  2 units of SSI required over last 24 hours. - CBGS with meals and before bed - sensitive SSI - Levemir 10 U daily, will be discharge dose and can be titrated by pcp  Lives alone with limited help: Recommended SNF placement, patient declined - Home health ordered  Elephantiasis nostra verrucosa  Bilateral LE Lymphedema: stable. Has not seen wound clinic for the past three months.  Not on ABX.  - per wound care - continue to apply Ammonium lactate 12% lotion  Anemia of Chronic Disease: Stable. Unknown etiology, could be secondary to possible CHF.  Pressure ulcer of upper posterior thigh, right, stage III - Wound care per Wound Care service  [place] Intertrigo of abdominal pannus and intergluteal cleft - Nystatin powder bid  Prostatic Adenocarcinoma: Per patient, urologist is [facility], per their office his last visit was Dec 2017.  CT abdomen/pelvis negative for any obvious pathology concerning for metastatic cancer, however prostate was not entirely visualized.  This appears to not be concerning for acute process, recommend further outpatient work-up. PSA elevated at 12.94 on 5/21.  - Outpatient follow-up warranted - decrease flomax dose to 1/2  Right Cheek Mass: See prior notes.  Appears  to be stable at this time, will recommend further outpatient workup.   Infestation by bed bug Appears to be resolved. Patient reports bed bugs "should be gone by now," although unclear if he actually had professional help come in.  [facility] will investigate, appreciate assistance.    FEN/GI: Carb modified diet PPx: Lovenox  Disposition: ALF vs Home  Subjective:  Patient states he is doing well this morning. He did not eat breakfast b/c he said he is not hungry. He denies any SOB but is on [place] and denies chest pain. He has no complaints except feeling fatigue and he has a non-productive.  Objective: Temp:  [97.5 F (36.4 C)-97.6 F (36.4 C)] 97.6 F (36.4 C) (06/06 2345) Pulse Rate:  [72-97] 74 (06/06 2345) Resp:  [16-25] 17 (06/06 2345) BP: (94-114)/(56-72) 108/56 (06/06 2345) SpO2:  [95 %-100 %] 100 % (06/06 2345) Weight:  [313 lb 11.4 oz (142.3 kg)-321 lb 14 oz (146 kg)] 321 lb 14 oz (146 kg) (06/07 0500) Physical Exam:  Gen: Alert and Oriented x 3, NAD HEENT: Normocephalic, atraumatic, PERRLA, EOMI CV: RRR, 3/6 systolic murmur, normal S1, S2 split Resp: decreased breath sounds throughout, no wheezing, rales, or rhonchi, comfortable work of breathing on  [place] 3L Abd: non-distended, non-tender, soft, +bs in all four quadrants MSK: Moves all four extremities Ext: no clubbing, cyanosis,  Skin: warm, dry, intact, no rashes, multiple bed bug bites over the arms, body, and legs, severe bilateral elephantiasis   Laboratory: Recent Labs  Lab 06/04/18 0304  06/04/18 0735 06/04/18 1012 06/05/18 0349  WBC 10.8*  --   --  10.7* 8.1  HGB 11.7*   < > 11.6* 10.1* 10.3*  HCT 38.2*   < > 34.0* 32.5* 33.6*  PLT 358  --   --  283 263   < > = values in this interval not displayed.   Recent Labs  Lab 06/01/18 0340  06/04/18 0735 06/04/18 1012 06/05/18 0349  NA 136   < > 137 137 139  K 4.2   < > 5.0 4.1 4.0  CL 99*   < > 99* 104 104  CO2 29  --   --  25 29  BUN 28*   < > 46* 40* 35*  CREATININE 1.19   < > 2.70* 2.52* 1.52*  CALCIUM 9.3  --   --  8.5* 8.6*  PROT  --   --   --  7.2  --   BILITOT  --   --   --  0.7  --   ALKPHOS  --   --   --  86  --   ALT  --   --   --  10*  --   AST  --   --   --  11*  --    GLUCOSE 132*   < > 177* 188* 101*   < > = values in this interval not displayed.   Toponin: <0.03 x 3 Lactic Acid: 1.0 > 2.2 Strep Pneumo: Neg Legionella: pending BCx: pending BNP: 29.8  Imaging/Diagnostic Tests: CXR IMPRESSION: Mild elevation of the left hemidiaphragm. Mild left basilar airspace opacity may reflect atelectasis. Suspect small left pleural effusion.  CTA Chest IMPRESSION: 1. No evidence of pulmonary embolus. 2. Left lower lobe pneumonia noted. Minimal opacity at the right lung base. 3. Aneurysmal dilatation of the ascending thoracic aorta to 4.6 cm in AP dimension. Ascending thoracic aortic aneurysm. Recommend semi-annual imaging followup by CTA or MRA and referral  to cardiothoracic surgery if not already obtained. This recommendation follows 2010 ACCF/AHA/AATS/ACR/ASA/SCA/SCAI/SIR/STS/SVM Guidelines for the Diagnosis and Management of Patients With Thoracic Aortic Disease. Circulation. 2010; 121: W258-N277 4. Scattered coronary artery calcifications seen. 5. Mild bilateral gynecomastia noted.  Nuala Alpha, DO 06/05/2018, 7:27 AM PGY-1, [place] Intern pager: [phone], text pages welcome

## 2018-06-06 LAB — BASIC METABOLIC PANEL
ANION GAP: 7 (ref 5–15)
BUN: 24 mg/dL — ABNORMAL HIGH (ref 6–20)
CO2: 27 mmol/L (ref 22–32)
Calcium: 8.6 mg/dL — ABNORMAL LOW (ref 8.9–10.3)
Chloride: 105 mmol/L (ref 101–111)
Creatinine, Ser: 0.93 mg/dL (ref 0.61–1.24)
Glucose, Bld: 138 mg/dL — ABNORMAL HIGH (ref 65–99)
Potassium: 4.3 mmol/L (ref 3.5–5.1)
SODIUM: 139 mmol/L (ref 135–145)

## 2018-06-06 LAB — CBC
HCT: 32 % — ABNORMAL LOW (ref 39.0–52.0)
HEMOGLOBIN: 9.5 g/dL — AB (ref 13.0–17.0)
MCH: 25.1 pg — ABNORMAL LOW (ref 26.0–34.0)
MCHC: 29.7 g/dL — ABNORMAL LOW (ref 30.0–36.0)
MCV: 84.7 fL (ref 78.0–100.0)
PLATELETS: 254 10*3/uL (ref 150–400)
RBC: 3.78 MIL/uL — AB (ref 4.22–5.81)
RDW: 14.8 % (ref 11.5–15.5)
WBC: 6.3 10*3/uL (ref 4.0–10.5)

## 2018-06-06 LAB — GLUCOSE, CAPILLARY
GLUCOSE-CAPILLARY: 124 mg/dL — AB (ref 65–99)
GLUCOSE-CAPILLARY: 140 mg/dL — AB (ref 65–99)
Glucose-Capillary: 129 mg/dL — ABNORMAL HIGH (ref 65–99)

## 2018-06-06 LAB — LEGIONELLA PNEUMOPHILA TOTAL AB: Legionella Pneumo Total Ab: <0.91 OD ratio (ref 0.00–0.90)

## 2018-06-06 NOTE — NC FL2 (Signed)
[place] LEVEL OF CARE SCREENING TOOL     IDENTIFICATION  Patient Name: [name] Birthdate: November 12, 1941 Sex: male Admission Date (Current Location): 06/04/2018  [facility] and [place] Number:  Herbalist and Address:  [facility] [place] [address] [place], [place] [address]      Provider Number: [id number]  Attending Physician Name and Address:  Zenia Resides, MD  Relative Name and Phone Number:  Cheri Rous, daughter, [phone]    Current Level of Care: Hospital Recommended Level of Care: [place] Prior Approval Number:    Date Approved/Denied:   PASRR Number:    Discharge Plan: SNF    Current Diagnoses: Patient Active Problem List   Diagnosis Date Noted  . Pneumonia 06/04/2018  . Dehydration   . HCAP (healthcare-associated pneumonia)   . Infestation by insect   . Hypotension   . Acute on chronic combined systolic and diastolic congestive heart failure ([place])   . Fluid overload 05/19/2018  . Elephantiasis nostra verrucosa 05/19/2018  . Pressure ulcer of upper thigh, right, stage III ([place]) 05/19/2018  . Intertrigo 05/19/2018  . Chronic venous insufficiency, Bilateral Legs 05/19/2018  . Elevated PSA, greater than or equal to 20 ng/ml 05/19/2018  . History of prostate surgery, Per Patient report, [facility] 05/19/2018  . Infestation by bed bug 05/19/2018  . Lives alone with limited help 05/19/2018  . Morbid obesity ([place]) 05/19/2018  . Dyspnea and respiratory abnormality 05/19/2018  . Systolic ejection murmur with radiation into Cartoids bilaterally 05/19/2018  . Paget's bone disease 05/19/2018  . Pulmonary hypertension ([place]) 05/19/2018  . Hypoxia   . AKI (acute kidney injury) ([place])   . Elephantiasis nostras 03/29/2016  . Impaired mobility 09/09/2014  . Diabetic peripheral neuropathy associated with type 2 diabetes mellitus ([place]) 02/07/2014  . Ulcer of left lower leg ([place]) 10/05/2012   . Anemia 05/21/2012  . Chronic leg pain 11/17/2011  . Chronic osteomyelitis of lower leg ([place]) 11/20/2010  . HLD (hyperlipidemia) 12/01/2008  . ADENOCARCINOMA, PROSTATE 05/24/2008  . DM type 2, uncontrolled, with neuropathy ([place]) 01/20/2008  . Morbid obesity with body mass index of 50.0-59.9 in adult ([place]) 03/05/2007  . Essential hypertension 03/05/2007    Orientation RESPIRATION BLADDER Height & Weight     Self, Time, Situation, Place  O2(2L nasal canula) Continent Weight: (!) 316 lb 5.8 oz (143.5 kg) Height:  6' (182.9 cm)  BEHAVIORAL SYMPTOMS/MOOD NEUROLOGICAL BOWEL NUTRITION STATUS      Continent Diet(see discharge summary)  AMBULATORY STATUS COMMUNICATION OF NEEDS Skin   Extensive Assist Verbally Skin abrasions, Other (Comment), PU Stage and Appropriate Care(abrasions and cracking on circumferential right and left legs; diabetic ulcers on R/L legs )   PU Stage 2 Dressing: (ulcer on medial sacrum with foam PRN dressing; ulcer on left thigh)                   Personal Care Assistance Level of Assistance  Bathing, Feeding, Dressing Bathing Assistance: Maximum assistance Feeding assistance: Independent Dressing Assistance: Maximum assistance     Functional Limitations Info  Sight, Hearing, Speech Sight Info: Adequate Hearing Info: Impaired Speech Info: Adequate    SPECIAL CARE FACTORS FREQUENCY  OT (By licensed OT), PT (By licensed PT)     PT Frequency: 5x week OT Frequency: 5x week            Contractures Contractures Info: Not present    Additional Factors Info  Code Status, Allergies,  Insulin Sliding Scale Code Status Info: Full Code Allergies Info: ACE INHIBITORS, ANGIOTENSIN RECEPTOR BLOCKERS, METFORMIN    Insulin Sliding Scale Info: insulin aspart (novoLOG) injection 0-15 Units 3x day; insulin detemir (LEVEMIR) injection 10 Units; insulin detemir (LEVEMIR) injection 10 Units daily       Current Medications (06/06/2018):  This is the current hospital  active medication list Current Facility-Administered Medications  Medication Dose Route Frequency Provider Last Rate Last Dose  . acetaminophen (TYLENOL) tablet 650 mg  650 mg Oral Q6H PRN Mikell, Jeani Sow, MD       Or  . acetaminophen (TYLENOL) suppository 650 mg  650 mg Rectal Q6H PRN Mikell, Jeani Sow, MD      . albuterol (PROVENTIL) (2.5 MG/3ML) 0.083% nebulizer solution 3 mL  3 mL Inhalation Q6H PRN Mikell, Jeani Sow, MD      . aspirin chewable tablet 81 mg  81 mg Oral QODAY Mikell, Jeani Sow, MD   81 mg at 06/05/18 1025  . ceFEPIme (MAXIPIME) 2 g in sodium chloride 0.9 % 100 mL IVPB  2 g Intravenous Q24H Reddick Bing, DO 200 mL/hr at 06/06/18 1353 2 g at 06/06/18 1353  . enoxaparin (LOVENOX) injection 70 mg  70 mg Subcutaneous Q24H Tonette Bihari, MD   70 mg at 06/05/18 2222  . fluticasone (FLONASE) 50 MCG/ACT nasal spray 1 spray  1 spray Each Nare Daily Mikell, Jeani Sow, MD   1 spray at 06/06/18 1249  . furosemide (LASIX) tablet 40 mg  40 mg Oral BID Kathrene Alu, MD   40 mg at 06/06/18 0929  . insulin aspart (novoLOG) injection 0-15 Units  0-15 Units Subcutaneous TID WC Tonette Bihari, MD   2 Units at 06/06/18 1300  . insulin detemir (LEVEMIR) injection 10 Units  10 Units Subcutaneous Daily Tonette Bihari, MD   10 Units at 06/06/18 [phone]  . latanoprost (XALATAN) 0.005 % ophthalmic solution 1 drop  1 drop Both Eyes QHS Mikell, Jeani Sow, MD   1 drop at 06/05/18 2223  . nystatin (MYCOSTATIN/NYSTOP) topical powder   Topical BID Mikell, Jeani Sow, MD      . ondansetron (ZOFRAN) tablet 4 mg  4 mg Oral Q6H PRN Mikell, Jeani Sow, MD       Or  . ondansetron (ZOFRAN) injection 4 mg  4 mg Intravenous Q6H PRN Mikell, Jeani Sow, MD      . pantoprazole (PROTONIX) EC tablet 40 mg  40 mg Oral Daily Tonette Bihari, MD   40 mg at 06/06/18 0929  . polyethylene glycol (MIRALAX / GLYCOLAX) packet 17 g  17 g Oral Daily PRN Mikell, Jeani Sow, MD       . pravastatin (PRAVACHOL) tablet 40 mg  40 mg Oral q1800 Tonette Bihari, MD   40 mg at 06/05/18 1645  . tamsulosin (FLOMAX) capsule 0.4 mg  0.4 mg Oral q morning - 10a Winfrey, Alcario Drought, MD   0.4 mg at 06/06/18 6237     Discharge Medications: Please see discharge summary for a list of discharge medications.  Relevant Imaging Results:  Relevant Lab Results:   Additional Information SS# [place], [place]

## 2018-06-06 NOTE — Social Work (Addendum)
CSW aware that pt will be interested in SNF placement at discharge. CSW will begin SNF process, and will be available to assist with disposition when pt is medically appropriate for discharge. CSW will initiate SNF authorization with [employer].  [place], [place] Work [phone]

## 2018-06-06 NOTE — Progress Notes (Signed)
Family Medicine Teaching Service Daily Progress Note Intern Pager: [phone]  Patient name: [name] Medical record number: [id number] Date of birth: 01/20/1941 Age: 77 y.o. Gender: male  Primary Care Provider: Tonette Bihari, MD Consultants: None Code Status: Full  Pt Overview and Major Events to Date:  6/06 Admitted for SOB d/t LLL HAP based on CTA  Assessment and Plan: RONI Huff is a 77 y.o. male presenting with SOB. PMH is significant for prostate adenocarcinoma, anemia, BLE lymphadema, chronic non-healing wounds BLE, T2DM,elephantiasis nostras verrucosaRLE, impaired mobility, morbid obesity, hypertension, HLD.  1.  Left lower lobe healthcare acquired pneumonia: Acute.  Likely source of SOB.  Diagnosed by CTA on presentation.  Remains afebrile on cefepime.  Leukocytosis now resolved.  Legionella and strep pneumo antigen negative. - Continue cefepime, day 3 of 7 - Monitor fever curve - Continuous pulse ox, incentive spirometry  2.  AKI: Resolved.  Likely prerenal secondary to diuresis with Lasix for presumed HF exacerbation and initiation of contrast dye with CT scan in ED.  Patient without oligouria.   - Continue Lasix 40 mg twice daily - Holding home HCTZ/triamterene - Strict I&O's, daily weights - Avoiding IVF given recent history of HF exacerbation  3.  HFpEF: Chronic.  Stable.  TTE c/w EF 60-65%, G2DD.  Weight down to dry weight at 316 lbs. - Plan per problem #2  4.  Hypertension: Chronic.  Remains normotensive. - Plan per problem #2  5.  Diabetes mellitus type 2: Chronic.  A1c 9.3 on last admission.  CBGs low to mid 100s.  Home medications include Levemir 48 mg twice daily. - Continue Levemir decreased dose, 10 units daily, sensitive sliding scale - CBGs AC/at bedtime  6.  Elephantitis nostra verrucosa  Bilateral LE lymphedema: Chronic.  Stable. - Imodium lactate 12% lotion - Diuresis per above  7.  Anemia of chronic disease: Chronic.  Stable.   No signs of active bleeding - Monitor for signs of bleeding  8.  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  Stable. - Wound care consulted, appreciate recommendations  9.  Monilial intertrigo of abdominal pannus and intergluteal cleft: Chronic.  Stable. - Nystatin powder twice daily  10.  Prostatic adenocarcinoma: Chronic.  Last visit with [facility] 11/2016.  CTA abdomen/pelvis negative for concerns of metastatic cancer however prostate not entirely visualized.  PSA elevated at 12.94 on 05/19/2018. - Follow-up outpatient with urology - Continue home Flomax at decreased dose 0.4 mg daily  11.  Right cheek mass: Chronic.  Stable.  Stable per ultrasound during prior admission on 04/2018. - Follow-up outpatient  12.  Bedbug infestation: Chronic.  Secondary to poor home environment.  Patient remains assistant to SNF placement. - Plan to continue encouraging patient to consider SNF - PT/OT consulted, appreciate recommendations, advising SNF, patient amenable  FEN/GI: Carb modified diet PPx: Lovenox  Disposition: Awaiting placement to SNF  Subjective:  Patient states he is doing well this morning.  Feels that his shortness of breath has improved.  Is hopeful that he will do better at a SNF.  Objective: Temp:  [98.3 F (36.8 C)-98.6 F (37 C)] 98.3 F (36.8 C) (06/08 0755) Pulse Rate:  [63-73] 68 (06/08 0755) Resp:  [15-18] 18 (06/08 0755) BP: (108-131)/(51-60) 108/60 (06/08 0755) SpO2:  [98 %-100 %] 98 % (06/08 0755) Weight:  [316 lb 5.8 oz (143.5 kg)] 316 lb 5.8 oz (143.5 kg) (06/08 0500) Physical Exam: General: lying in bed watching TV, comfortable, NAD  with non-toxic appearance HEENT: normocephalic, atraumatic, moist mucous membranes Neck: supple, non-tender without lymphadenopathy, no JVD Cardiovascular: regular rate and rhythm with 3/6 systolic murmur Lungs: clear to auscultation bilaterally with normal work of breathing on 2 L [place] Abdomen:  soft, non-tender, non-distended, normoactive bowel sounds Skin: warm, dry, no rashes or lesions, cap refill < 2 seconds Extremities: warm and well perfused, normal tone, chronic bilateral lower extremity edema with elephantitis  Laboratory: Recent Labs  Lab 06/04/18 1012 06/05/18 0349 06/06/18 0248  WBC 10.7* 8.1 6.3  HGB 10.1* 10.3* 9.5*  HCT 32.5* 33.6* 32.0*  PLT 283 263 254   Recent Labs  Lab 06/04/18 1012 06/05/18 0349 06/06/18 0248  NA 137 139 139  K 4.1 4.0 4.3  CL 104 104 105  CO2 25 29 27   BUN 40* 35* 24*  CREATININE 2.52* 1.52* 0.93  CALCIUM 8.5* 8.6* 8.6*  PROT 7.2  --   --   BILITOT 0.7  --   --   ALKPHOS 86  --   --   ALT 10*  --   --   AST 11*  --   --   GLUCOSE 188* 101* 138*   Toponin: <0.03 x 3 Lactic Acid: 1.0 > 2.2 Strep Pneumo: Neg Legionella: Neg BCx: neg BNP: 29.8  Imaging/Diagnostic Tests: CHEST - 2 VIEW (06/04/2018) Mild elevation of the left hemidiaphragm. Mild left basilar airspace opacity may reflect atelectasis. Suspect small left pleural effusion.  CT ANGIOGRAPHY CHEST WITH CONTRAST (06/04/2018) IMPRESSION: 1. No evidence of pulmonary embolus. 2. Left lower lobe pneumonia noted. Minimal opacity at the right lung base. 3. Aneurysmal dilatation of the ascending thoracic aorta to 4.6 cm in AP dimension. Ascending thoracic aortic aneurysm. Recommend semi-annual imaging followup by CTA or MRA and referral to cardiothoracic surgery if not already obtained. This recommendation follows 2010 ACCF/AHA/AATS/ACR/ASA/SCA/SCAI/SIR/STS/SVM Guidelines for the Diagnosis and Management of Patients With Thoracic Aortic Disease. Circulation. 2010; 121: P809-X833 4. Scattered coronary artery calcifications seen. 5. Mild bilateral gynecomastia noted.    Herrick Bing, DO 06/06/2018, 8:32 AM PGY-2, [place] Intern pager: [phone], text pages welcome

## 2018-06-07 LAB — BASIC METABOLIC PANEL
Anion gap: 7 (ref 5–15)
BUN: 16 mg/dL (ref 6–20)
CALCIUM: 8.6 mg/dL — AB (ref 8.9–10.3)
CO2: 26 mmol/L (ref 22–32)
CREATININE: 0.99 mg/dL (ref 0.61–1.24)
Chloride: 105 mmol/L (ref 101–111)
GFR calc Af Amer: >60 mL/min (ref >60)
Glucose, Bld: 115 mg/dL — ABNORMAL HIGH (ref 65–99)
Potassium: 4.7 mmol/L (ref 3.5–5.1)
Sodium: 138 mmol/L (ref 135–145)

## 2018-06-07 LAB — GLUCOSE, CAPILLARY
GLUCOSE-CAPILLARY: 169 mg/dL — AB (ref 65–99)
Glucose-Capillary: 142 mg/dL — ABNORMAL HIGH (ref 65–99)
Glucose-Capillary: 146 mg/dL — ABNORMAL HIGH (ref 65–99)
Glucose-Capillary: 120 mg/dL — ABNORMAL HIGH (ref 65–99)
Glucose-Capillary: 173 mg/dL — ABNORMAL HIGH (ref 65–99)

## 2018-06-07 LAB — CBC
HEMATOCRIT: 33.2 % — AB (ref 39.0–52.0)
Hemoglobin: 9.7 g/dL — ABNORMAL LOW (ref 13.0–17.0)
MCH: 25.1 pg — ABNORMAL LOW (ref 26.0–34.0)
MCHC: 29.2 g/dL — AB (ref 30.0–36.0)
MCV: 85.8 fL (ref 78.0–100.0)
PLATELETS: 255 10*3/uL (ref 150–400)
RBC: 3.87 MIL/uL — ABNORMAL LOW (ref 4.22–5.81)
RDW: 14.9 % (ref 11.5–15.5)
WBC: 6.8 10*3/uL (ref 4.0–10.5)

## 2018-06-07 MED ORDER — INSULIN ASPART 100 UNIT/ML ~~LOC~~ SOLN
0.0000 [IU] | Freq: Once | SUBCUTANEOUS | Status: AC
  Administered 2018-06-07: 3 [IU] via SUBCUTANEOUS

## 2018-06-07 NOTE — Progress Notes (Signed)
CSW following for discharge plan. Per MD note, patient remains amenable to SNF placement. Patient referral has been sent out; he has no bed offers at this time. Also, [employer] Authorization is still pending. Patient will need prior authorization as well as an accepted facility prior to transition to SNF at discharge.  CSW to continue to follow.  Laveda Abbe, [place] Clinical Social Worker [phone]

## 2018-06-07 NOTE — Progress Notes (Signed)
Family Medicine Teaching Service Daily Progress Note Intern Pager: [phone]  Patient name: [name] Medical record number: [id number] Date of birth: 07-22-1941 Age: 77 y.o. Gender: male  Primary Care Provider: Tonette Bihari, MD Consultants: None Code Status: Full  Pt Overview and Major Events to Date:  6/06 Admitted for SOB d/t LLL HAP based on CTA  Assessment and Plan: [name] is a 77 y.o. male presenting with SOB. PMH is significant for prostate adenocarcinoma, anemia, BLE lymphadema, chronic non-healing wounds BLE, T2DM,elephantiasis nostras verrucosaRLE, impaired mobility, morbid obesity, hypertension, HLD.  1.  Left lower lobe healthcare acquired pneumonia: Acute.  Likely source of SOB.  Diagnosed by CTA on presentation.  Remains afebrile on cefepime.  Leukocytosis now resolved.  Legionella and strep pneumo antigen negative. - Continue cefepime, day 4 of 7 - Monitor fever curve - Continuous pulse ox, incentive spirometry  2.  AKI: Resolved.  Likely prerenal secondary to diuresis with Lasix for presumed HF exacerbation and initiation of contrast dye with CT scan in ED.  Patient without oligouria.   - Continue Lasix 40 mg twice daily - Holding home HCTZ/triamterene - Strict I&O's, daily weights - Avoiding IVF given recent history of HF exacerbation  3.  HFpEF: Chronic.  Stable.  TTE c/w EF 60-65%, G2DD.  Weight down to dry weight at 316 lbs. - Plan per problem #2  4.  Hypertension: Chronic.  Remains normotensive. - Plan per problem #2  5.  Diabetes mellitus type 2: Chronic.  A1c 9.3 on last admission.  CBGs low to mid 100s.  Home medications include Levemir 48 mg twice daily. - Continue Levemir decreased dose, 10 units daily, sensitive sliding scale - CBGs AC/at bedtime  6.  Elephantitis nostra verrucosa  Bilateral LE lymphedema: Chronic.  Stable. - Imodium lactate 12% lotion - Diuresis per above  7.  Anemia of chronic disease: Chronic.  Stable.   No signs of active bleeding - Monitor for signs of bleeding  8.  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  Stable. - Wound care consulted, appreciate recommendations  9.  Monilial intertrigo of abdominal pannus and intergluteal cleft: Chronic.  Stable. - Nystatin powder twice daily  10.  Prostatic adenocarcinoma: Chronic.  [facility] visit with [facility] 11/2016.  CTA abdomen/pelvis negative for concerns of metastatic cancer however prostate not entirely visualized.  PSA elevated at 12.94 on 05/19/2018. - Follow-up outpatient with urology - Continue home Flomax at decreased dose 0.4 mg daily  11.  Right cheek mass: Chronic.  Stable.  Stable per ultrasound during prior admission on 04/2018. - Follow-up outpatient  12.  Bedbug infestation: Chronic.  Secondary to poor home environment.  Patient remains assistant to SNF placement. - Plan to continue encouraging patient to consider SNF - PT/OT consulted, appreciate recommendations, advising SNF, patient amenable  FEN/GI: Carb modified diet PPx: Lovenox  Disposition: Awaiting placement to SNF  Subjective:  Patient without complaints this morning. Denies SOB, CP, n/v/d.  Objective: Temp:  [97.8 F (36.6 C)-98.3 F (36.8 C)] 97.8 F (36.6 C) (06/09 0107) Pulse Rate:  [68-71] 71 (06/09 0107) Resp:  [14-18] 14 (06/09 0107) BP: (108-122)/(60-61) 122/60 (06/09 0107) SpO2:  [98 %-100 %] 100 % (06/09 0107) Physical Exam: General: comfortable, lying in bed asleep, NAD with non-toxic appearance HEENT: normocephalic, atraumatic, moist mucous membranes Neck: supple, non-tender without lymphadenopathy, no JVD Cardiovascular: regular rate and rhythm with 3/6 systolic murmur Lungs: clear to auscultation bilaterally with normal work of breathing on  2 L [place] Abdomen: soft, non-tender, non-distended, normoactive bowel sounds Skin: warm, dry, no rashes or lesions, cap refill < 2 seconds Extremities: warm and well  perfused, normal tone, chronic bilateral lower extremity edema with elephantitis   Laboratory: Recent Labs  Lab 06/05/18 0349 06/06/18 0248 06/07/18 0306  WBC 8.1 6.3 6.8  HGB 10.3* 9.5* 9.7*  HCT 33.6* 32.0* 33.2*  PLT 263 254 255   Recent Labs  Lab 06/04/18 1012 06/05/18 0349 06/06/18 0248 06/07/18 0306  NA 137 139 139 138  K 4.1 4.0 4.3 4.7  CL 104 104 105 105  CO2 25 29 27 26   BUN 40* 35* 24* 16  CREATININE 2.52* 1.52* 0.93 0.99  CALCIUM 8.5* 8.6* 8.6* 8.6*  PROT 7.2  --   --   --   BILITOT 0.7  --   --   --   ALKPHOS 86  --   --   --   ALT 10*  --   --   --   AST 11*  --   --   --   GLUCOSE 188* 101* 138* 115*   Toponin: <0.03 x 3 Lactic Acid: 1.0 > 2.2 Strep Pneumo: Neg Legionella: Neg BCx: neg BNP: 29.8  Imaging/Diagnostic Tests: CHEST - 2 VIEW (06/04/2018) Mild elevation of the left hemidiaphragm. Mild left basilar airspace opacity may reflect atelectasis. Suspect small left pleural effusion.  CT ANGIOGRAPHY CHEST WITH CONTRAST (06/04/2018) IMPRESSION: 1. No evidence of pulmonary embolus. 2. Left lower lobe pneumonia noted. Minimal opacity at the right lung base. 3. Aneurysmal dilatation of the ascending thoracic aorta to 4.6 cm in AP dimension. Ascending thoracic aortic aneurysm. Recommend semi-annual imaging followup by CTA or MRA and referral to cardiothoracic surgery if not already obtained. This recommendation follows 2010 ACCF/AHA/AATS/ACR/ASA/SCA/SCAI/SIR/STS/SVM Guidelines for the Diagnosis and Management of Patients With Thoracic Aortic Disease. Circulation. 2010; 121: S923-R007 4. Scattered coronary artery calcifications seen. 5. Mild bilateral gynecomastia noted.    Burgess Bing, DO 06/07/2018, 6:55 AM PGY-2, [place] Intern pager: [phone], text pages welcome

## 2018-06-08 ENCOUNTER — Ambulatory Visit: Payer: PPO | Admitting: Internal Medicine

## 2018-06-08 DIAGNOSIS — E785 Hyperlipidemia, unspecified: Secondary | ICD-10-CM | POA: Diagnosis not present

## 2018-06-08 DIAGNOSIS — J309 Allergic rhinitis, unspecified: Secondary | ICD-10-CM | POA: Diagnosis not present

## 2018-06-08 DIAGNOSIS — R0602 Shortness of breath: Secondary | ICD-10-CM | POA: Diagnosis not present

## 2018-06-08 DIAGNOSIS — H401130 Primary open-angle glaucoma, bilateral, stage unspecified: Secondary | ICD-10-CM | POA: Diagnosis not present

## 2018-06-08 DIAGNOSIS — J189 Pneumonia, unspecified organism: Secondary | ICD-10-CM | POA: Diagnosis not present

## 2018-06-08 DIAGNOSIS — I509 Heart failure, unspecified: Secondary | ICD-10-CM | POA: Diagnosis not present

## 2018-06-08 DIAGNOSIS — I10 Essential (primary) hypertension: Secondary | ICD-10-CM | POA: Diagnosis not present

## 2018-06-08 DIAGNOSIS — Z7401 Bed confinement status: Secondary | ICD-10-CM | POA: Diagnosis not present

## 2018-06-08 DIAGNOSIS — E119 Type 2 diabetes mellitus without complications: Secondary | ICD-10-CM | POA: Diagnosis not present

## 2018-06-08 DIAGNOSIS — L89892 Pressure ulcer of other site, stage 2: Secondary | ICD-10-CM | POA: Diagnosis not present

## 2018-06-08 DIAGNOSIS — H02854 Elephantiasis of left upper eyelid: Secondary | ICD-10-CM | POA: Diagnosis not present

## 2018-06-08 DIAGNOSIS — M2550 Pain in unspecified joint: Secondary | ICD-10-CM | POA: Diagnosis not present

## 2018-06-08 DIAGNOSIS — K219 Gastro-esophageal reflux disease without esophagitis: Secondary | ICD-10-CM | POA: Diagnosis not present

## 2018-06-08 LAB — CBC
HEMATOCRIT: 34.7 % — AB (ref 39.0–52.0)
HEMOGLOBIN: 10.6 g/dL — AB (ref 13.0–17.0)
MCH: 25.1 pg — ABNORMAL LOW (ref 26.0–34.0)
MCHC: 30.5 g/dL (ref 30.0–36.0)
MCV: 82.2 fL (ref 78.0–100.0)
Platelets: 253 10*3/uL (ref 150–400)
RBC: 4.22 MIL/uL (ref 4.22–5.81)
RDW: 14.6 % (ref 11.5–15.5)
WBC: 6.1 10*3/uL (ref 4.0–10.5)

## 2018-06-08 LAB — GLUCOSE, CAPILLARY
GLUCOSE-CAPILLARY: 131 mg/dL — AB (ref 65–99)
GLUCOSE-CAPILLARY: 195 mg/dL — AB (ref 65–99)
Glucose-Capillary: 143 mg/dL — ABNORMAL HIGH (ref 65–99)

## 2018-06-08 LAB — BASIC METABOLIC PANEL
ANION GAP: 7 (ref 5–15)
BUN: 13 mg/dL (ref 6–20)
CO2: 31 mmol/L (ref 22–32)
Calcium: 9.1 mg/dL (ref 8.9–10.3)
Chloride: 99 mmol/L — ABNORMAL LOW (ref 101–111)
Creatinine, Ser: 0.84 mg/dL (ref 0.61–1.24)
GFR calc Af Amer: >60 mL/min (ref >60)
Glucose, Bld: 134 mg/dL — ABNORMAL HIGH (ref 65–99)
POTASSIUM: 3.8 mmol/L (ref 3.5–5.1)
SODIUM: 137 mmol/L (ref 135–145)

## 2018-06-08 MED ORDER — POLYETHYLENE GLYCOL 3350 17 G PO PACK
17.0000 g | PACK | Freq: Every day | ORAL | Status: DC
  Administered 2018-06-08: 17 g via ORAL
  Filled 2018-06-08: qty 1

## 2018-06-08 MED ORDER — SENNOSIDES-DOCUSATE SODIUM 8.6-50 MG PO TABS
2.0000 | ORAL_TABLET | Freq: Two times a day (BID) | ORAL | Status: DC
  Administered 2018-06-08: 2 via ORAL
  Filled 2018-06-08: qty 2

## 2018-06-08 MED ORDER — CEPHALEXIN 500 MG PO TABS: 500.0000 mg | ORAL_TABLET | Freq: Four times a day (QID) | ORAL | 0 refills | Status: AC

## 2018-06-08 MED ORDER — BUDESONIDE 0.5 MG/2ML IN SUSP
0.5000 mg | Freq: Two times a day (BID) | RESPIRATORY_TRACT | Status: DC
  Filled 2018-06-08: qty 2

## 2018-06-08 NOTE — Clinical Social Work Placement (Signed)
   CLINICAL SOCIAL WORK PLACEMENT  NOTE  Date:  06/08/2018  Patient Details  Name: [name] MRN: [id number] Date of Birth: [birth year]  Clinical Social Work is seeking post-discharge placement for this patient at the [place] level of care (*CSW will initial, date and re-position this form in  chart as items are completed):  Yes   Patient/family provided with [place] Work Department's list of facilities offering this level of care within the geographic area requested by the patient (or if unable, by the patient's family).  Yes   Patient/family informed of their freedom to choose among providers that offer the needed level of care, that participate in Medicare, Medicaid or managed care program needed by the patient, have an available bed and are willing to accept the patient.  Yes   Patient/family informed of [facility]'s ownership interest in [facility] and [facility], as well as of the fact that they are under no obligation to receive care at these facilities.  PASRR submitted to EDS on 06/07/18     PASRR number received on 06/07/18     Existing PASRR number confirmed on       FL2 transmitted to all facilities in geographic area requested by pt/family on 06/07/18     FL2 transmitted to all facilities within larger geographic area on       Patient informed that his/her managed care company has contracts with or will negotiate with certain facilities, including the following:        Yes   Patient/family informed of bed offers received.  Patient chooses bed at [facility]     Physician recommends and patient chooses bed at      Patient to be transferred to [facility] on 06/08/18.  Patient to be transferred to facility by ptar     Patient family notified on 06/08/18 of transfer.  Name of family member notified:  Ivin Booty     PHYSICIAN Please sign FL2     Additional Comment:     _______________________________________________ Jorge Ny, LCSW 06/08/2018, 1:49 PM

## 2018-06-08 NOTE — Clinical Social Work Note (Signed)
Clinical Social Work Assessment  Patient Details  Name: [name] MRN: [id number] Date of Birth: 1941/01/05  Date of referral:  06/08/18               Reason for consult:  Facility Placement                Permission sought to share information with:  Facility Art therapist granted to share information::  Yes, Verbal Permission Granted  Name::        Agency::  SNF  Relationship::     Contact Information:     Housing/Transportation Living arrangements for the past 2 months:  Apartment Source of Information:  Patient Patient Interpreter Needed:  None Criminal Activity/Legal Involvement Pertinent to Current Situation/Hospitalization:  No - Comment as needed Significant Relationships:  Adult Children Lives with:  Self Do you feel safe going back to the place where you live?  No Need for family participation in patient care:  No (Coment)  Care giving concerns:  Pt lives at home alone- was just discharged home and ended up back in hospital after a few days- now understanding home might not be safe DC option at this time.   Social Worker assessment / plan:  CSW met with pt to discuss Pt recommendation for SNF.  Explained SNF and SNF referral process.  Patient somewhat familiar with SNF placement - states his wife was in SNF prior to her passing.  Employment status:  Retired Nurse, adult PT Recommendations:  [place] / Referral to community resources:  [place]  Patient/Family's Response to care:  Patient agreeable to short term SNF stay but is very set on only staying 1-2 weeks for treatment.  Patient/Family's Understanding of and Emotional Response to Diagnosis, Current Treatment, and Prognosis:  Pt seems to have good understanding of increased needs at this time- hopeful that he can return home soon as he values his independence.  Emotional Assessment Appearance:  Appears stated  age Attitude/Demeanor/Rapport:    Affect (typically observed):  Appropriate Orientation:  Oriented to Self, Oriented to Place, Oriented to Situation, Oriented to  Time Alcohol / Substance use:  Not Applicable Psych involvement (Current and /or in the community):  No (Comment)  Discharge Needs  Concerns to be addressed:  Care Coordination Readmission within the last 30 days:  Yes Current discharge risk:  Physical Impairment Barriers to Discharge:  Continued Medical Work up   [employer], LCSW 06/08/2018, 10:00 AM

## 2018-06-08 NOTE — Consult Note (Signed)
   [facility] CM Inpatient Consult   06/08/2018  AESON SAWYERS [birth year] [id number]   [facility] Care Management follow up.   Chart reviewed. Mr. Dewan discharged to [facility] SNF today 06/08/18.   Will update [facility] Community team.    Marthenia Rolling, [place], RN,BSN [facility] Liaison [phone]

## 2018-06-08 NOTE — Progress Notes (Signed)
Patient will discharge to [facility] Anticipated discharge date: 6/10 Family notified: Ivin Booty (pt dtr) Transportation by [facility]- scheduled for 3:30pm Report #: [id number]  CSW signing off.  Jorge Ny, LCSW Clinical Social Worker [phone]

## 2018-06-08 NOTE — Discharge Summary (Addendum)
[facility] Discharge Summary  Patient name: [name] Medical record number: [id number] Date of birth: [birth year] Age: 77 y.o. Gender: male Date of Admission: 06/04/2018  Date of Discharge: 06/08/2018 Admitting Physician: Zenia Resides, MD  Primary Care Provider: Tonette Bihari, MD Consultants: none  Indication for Hospitalization: hospital acquired pneumonia  Discharge Diagnoses/Problem List:  Hospital acquired pneumonia Acute Kidney injury Heart Failure with preserved ejection fraction Hypertension Type II diabetes mellitus Elaphantitis nostra verrucosa Anemia of chronic disease Stage III pressure injury   Disposition: snf  Discharge Condition: stable  Discharge Exam: Gen: Alert and Oriented x 3, NAD HEENT: Normocephalic, atraumatic, PERRLA, EOMI CV: RRR, 3/6 holosystolic murmur, normal S1, S2 split Resp: CTAB, no wheezing, rales, or rhonchi, comfortable work of breathing Abd: non-distended, non-tender, soft, +bs in all four quadrants Ext: no clubbing, cyanosis, or edema Skin: warm, dry, intact, no rashes, chronic bilateral lower ext edema with elephantitis  From Dr. Arlana Pouch note on 6/10  Brief Hospital Course:  77 year old male admitted on 6/6 for worsening shortness of breath, new infiltrate on CT. Patient had CTA without checking creatinine level in a known renal patient for unknown reasons.  Hospital Acquired PNA Patient with new infiltrate and treated as HAP 2/2 previous hospital stay. No clinical signs of sepsis, and little cough/fever. He was started on cefepime at admission which continued until his discharge on 6/10. He was switch to keflex 500mg  qd to complete a 7 day course. He was able to be continually weaned off of oxygen, down to his baseline of 2L.  AKI  hypotensionn Patient with acute kidney injury on admission. Thought to be due to a combination of over-diuresis and contrast induced. His home furosemide was  held until 6/7 and restarted. His hctz/triametere was held during admission and on discharge. He had persistent low-normal blood pressures in 852-778 systolic range during admission.   Placement issues Patient finally become amenable to snf despite resistance at last admission. Was discharged on 6/10 to snf.  Issues for Follow Up:  1. Ensure patient completes course of antibiotics 2. Monitor fever curve, respiratory status. ensure resolution of lung infection 3. HCTZ/Triameterne held on discharge due to low BP, consider restarting as outpt 4. Follow up fluid status, consider adding back hctz/trimaterene if overloaded 5. Refer to DC summary from 6/05 for other potential areas of follow up  Significant Procedures:  Significant Labs and Imaging:  Recent Labs  Lab 06/06/18 0248 06/07/18 0306 06/08/18 0745  WBC 6.3 6.8 6.1  HGB 9.5* 9.7* 10.6*  HCT 32.0* 33.2* 34.7*  PLT 254 255 253   Recent Labs  Lab 06/04/18 1012 06/05/18 0349 06/06/18 0248 06/07/18 0306 06/08/18 0745  NA 137 139 139 138 137  K 4.1 4.0 4.3 4.7 3.8  CL 104 104 105 105 99*  CO2 25 29 27 26 31   GLUCOSE 188* 101* 138* 115* 134*  BUN 40* 35* 24* 16 13  CREATININE 2.52* 1.52* 0.93 0.99 0.84  CALCIUM 8.5* 8.6* 8.6* 8.6* 9.1  ALKPHOS 86  --   --   --   --   AST 11*  --   --   --   --   ALT 10*  --   --   --   --   ALBUMIN 2.5*  --   --   --   --     Results/Tests Pending at Time of Discharge:  Discharge Medications:  Allergies as of 06/08/2018  Reactions   Ace Inhibitors Cough   With Lisinopril.   Angiotensin Receptor Blockers Other (See Comments)   Caused excessive weight gain   Metformin Diarrhea      Medication List    STOP taking these medications   ammonium lactate 12 % lotion Commonly known as:  LAC-HYDRIN   fluticasone 0.05 % cream Commonly known as:  CUTIVATE   silver sulfADIAZINE 1 % cream Commonly known as:  SILVADENE   triamcinolone ointment 0.1 % Commonly known as:  KENALOG    triamterene-hydrochlorothiazide 37.5-25 MG tablet Commonly known as:  MAXZIDE-25     TAKE these medications   aspirin 81 MG chewable tablet Chew 81 mg by mouth every other day.   Cephalexin 500 MG tablet Take 1 tablet (500 mg total) by mouth 4 (four) times daily for 3 days.   EASY TOUCH PEN NEEDLES 31G X 8 MM Misc Generic drug:  Insulin Pen Needle USE WITH FLEXPEN AS DIRECTED   esomeprazole 40 MG capsule Commonly known as:  NEXIUM TAKE 1 CAPSULE BY MOUTH EVERY DAY   FLOVENT HFA 220 MCG/ACT inhaler Generic drug:  fluticasone INHALE 1 PUFF INTO THE LUNGS TWICE DAILY   furosemide 40 MG tablet Commonly known as:  LASIX TAKE 1 TABLET BY MOUTH TWICE DAILY. What changed:    how much to take  how to take this  when to take this   insulin aspart 100 UNIT/ML injection Commonly known as:  novoLOG Inject 50 Units into the skin 2 (two) times daily.   Insulin Detemir 100 UNIT/ML Pen Commonly known as:  LEVEMIR FLEXTOUCH Inject 20 Units into the skin daily. What changed:    how much to take  when to take this   latanoprost 0.005 % ophthalmic solution Commonly known as:  XALATAN INSTILL 1 DROP IN BOTH EYES EVERY EVENING FOR GLAUCOMA   metoprolol tartrate 100 MG tablet Commonly known as:  LOPRESSOR Take 50 mg by mouth 2 (two) times daily.   mometasone 50 MCG/ACT nasal spray Commonly known as:  NASONEX INSTILL 2 SPRAYS INTO THE NOSE DAILY   ONE TOUCH DELICA LANCING DEV Misc 75 Units by Does not apply route 2 (two) times daily.   ONE TOUCH ULTRA TEST test strip Generic drug:  glucose blood USE TO CHECK BLOOD SUGAR FOUR TIMES A DAY   ONE TOUCH ULTRA TEST test strip Generic drug:  glucose blood USE TO TEST BLOOD SUGAR TWICE DAILY   ONETOUCH DELICA LANCETS 45Y Misc USE TO CHECK BLOOD SUGAR TWICE DAILY.   pravastatin 40 MG tablet Commonly known as:  PRAVACHOL TAKE 1 TABLET BY MOUTH DAILY   PROAIR HFA 108 (90 Base) MCG/ACT inhaler Generic drug:   albuterol INHALE 2 PUFFS BY MOUTH EVERY 4 HOURS AS NEEDED FOR WHEEZING OR SHORTNESS OF BREATH   tamsulosin 0.4 MG Caps capsule Commonly known as:  FLOMAX TAKE 2 CAPSULES BY MOUTH EVERY MORNING   traMADol 50 MG tablet Commonly known as:  ULTRAM TAKE 1 TABLET BY MOUTH 2 TIMES A DAY AS NEEDED FOR PAIN       Discharge Instructions: Please refer to Patient Instructions section of EMR for full details.  Patient was counseled important signs and symptoms that should prompt return to medical care, changes in medications, dietary instructions, activity restrictions, and follow up appointments.   Follow-Up Appointments:  Contact information for follow-up providers    Tonette Bihari, MD Follow up on 06/15/2018.   Specialty:  Family Medicine Why:  Please arrive for your  3:10PM appointment by 3:00. Contact information: [address] [place] [address] [phone]            Contact information for after-discharge care    Destination    [facility] .   Service:  Skilled Nursing Contact information: [address] [place]                  Guadalupe Dawn, MD 06/08/2018, 4:44 PM PGY-1, [place]

## 2018-06-08 NOTE — Care Management Important Message (Signed)
Important Message  Patient Details  Name: [name] MRN: [id number] Date of Birth: 1941/11/08   Medicare Important Message Given:  No  Precaution in place no IM given   Orbie Pyo 06/08/2018, 3:40 PM

## 2018-06-08 NOTE — Progress Notes (Signed)
Family Medicine Teaching Service Daily Progress Note Intern Pager: [phone]  Patient name: [name] Medical record number: [id number] Date of birth: 09/07/1941 Age: 77 y.o. Gender: male  Primary Care Provider: Tonette Bihari, MD Consultants: None Code Status: Full  Pt Overview and Major Events to Date:  6/06 Admitted for SOB d/t LLL HAP based on CTA  Assessment and Plan: [name] is a 77 y.o. male presenting with SOB. PMH is significant for prostate adenocarcinoma, anemia, BLE lymphadema, chronic non-healing wounds BLE, T2DM,elephantiasis nostras verrucosaRLE, impaired mobility, morbid obesity, hypertension, HLD.  1.  Left lower lobe hospital acquired pneumonia: Acute. Past 24hrs he has remained afebrile, sats >92% on room air and resolved leukocytosis. Likely source of SOB.  Diagnosed by CTA on presentation.  Remains afebrile on cefepime. Legionella and strep pneumo antigen negative. - Continue cefepime, day 5 of 7 - Monitor fever curve - Continuous pulse ox, incentive spirometry  2.  AKI: Resolved.  Scr 0.86. Likely prerenal secondary to diuresis with Lasix for presumed HF exacerbation and initiation of contrast dye with CT scan in ED.   - Continue Lasix 40 mg twice daily - Cont to hold home HCTZ/triamterene - Strict I&O's, daily weights - Avoiding IVF given recent history of HF exacerbation  3.  HFpEF: Chronic.  Stable.  TTE c/w EF 60-65%, G2DD.  Weight down to dry weight at 316 lbs. - Plan per problem #2  4.  Hypertension: Chronic.  Remains normotensive. Past 24hrs SBP range of 104-124 and DBP 62-72. - Plan per problem #2  5.  Diabetes mellitus type 2: Chronic.  A1c 9.3 on last admission.  CBGs low to mid 100s.  Home medications include Levemir 48 mg twice daily. Received 5U Novolog 6/9, 10U Determir. - Continue Levemir decreased dose, 10 units daily, sensitive sliding scale - CBGs AC/at bedtime  6.  Elephantitis nostra verrucosa  Bilateral LE  lymphedema: Chronic. Stable. - Imodium lactate 12% lotion - Diuresis per above  7.  Anemia of chronic disease: Chronic. Stable.  No signs of active bleeding - Monitor for signs of bleeding  8.  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  Stable. - Wound care consulted, appreciate recommendations  9.  Monilial intertrigo of abdominal pannus and intergluteal cleft: Chronic.  Stable. - Nystatin powder twice daily  10.  Prostatic adenocarcinoma: Chronic.  [facility] visit [facility] 11/2016.  CTA abdomen/pelvis negative for concerns of metastatic cancer however prostate not entirely visualized.  PSA elevated at 12.94 on 05/19/2018. - Follow-up outpatient with urology - Continue home Flomax at decreased dose 0.4 mg daily  11.  Right cheek mass: Chronic.  Stable.  Stable per ultrasound during prior admission on 04/2018. - Follow-up outpatient  12.  Bedbug infestation: Chronic.  Secondary to poor home environment.  Patient remains assistant to SNF placement. - Plan to send patient to SNF; CM states patient is awaitng approval and awaiting for SNF to have bed available - PT/OT consulted, appreciate recommendations, advising SNF, patient amenable  FEN/GI: Carb modified diet PPx: Lovenox  Disposition: Awaiting placement to SNF  Subjective:  Patient has no complaints this morning. He states he has a dry cough but otherwise feels fine. He has no shortness of breath and no chest pain. He remains amendable to going to SNF. He states he had a small BM yesterday without straining but did not go a lot and has had irregular bowel movements since admission. Informed patient I would start him on  a bowel regimen.  Objective: Temp:  [97.8 F (36.6 C)-98.8 F (37.1 C)] 97.8 F (36.6 C) (06/09 2300) Pulse Rate:  [64-68] 68 (06/09 2300) Resp:  [18-19] 18 (06/09 2300) BP: (104-124)/(62-72) 110/70 (06/09 2300) SpO2:  [97 %-100 %] 100 % (06/09 2300) Physical  Exam:  Gen: Alert and Oriented x 3, NAD HEENT: Normocephalic, atraumatic, PERRLA, EOMI CV: RRR, 3/6 holosystolic murmur, normal S1, S2 split Resp: CTAB, no wheezing, rales, or rhonchi, comfortable work of breathing Abd: non-distended, non-tender, soft, +bs in all four quadrants Ext: no clubbing, cyanosis, or edema Skin: warm, dry, intact, no rashes, chronic bilateral lower ext edema with elephantitis  Laboratory: Recent Labs  Lab 06/06/18 0248 06/07/18 0306 06/08/18 0745  WBC 6.3 6.8 6.1  HGB 9.5* 9.7* 10.6*  HCT 32.0* 33.2* 34.7*  PLT 254 255 253   Recent Labs  Lab 06/04/18 1012  06/06/18 0248 06/07/18 0306 06/08/18 0745  NA 137   < > 139 138 137  K 4.1   < > 4.3 4.7 3.8  CL 104   < > 105 105 99*  CO2 25   < > 27 26 31   BUN 40*   < > 24* 16 13  CREATININE 2.52*   < > 0.93 0.99 0.84  CALCIUM 8.5*   < > 8.6* 8.6* 9.1  PROT 7.2  --   --   --   --   BILITOT 0.7  --   --   --   --   ALKPHOS 86  --   --   --   --   ALT 10*  --   --   --   --   AST 11*  --   --   --   --   GLUCOSE 188*   < > 138* 115* 134*   < > = values in this interval not displayed.   Toponin: <0.03 x 3 Lactic Acid: 1.0 > 2.2 Strep Pneumo: Neg Legionella: Neg BCx: neg BNP: 29.8  Imaging/Diagnostic Tests: CHEST - 2 VIEW (06/04/2018) Mild elevation of the left hemidiaphragm. Mild left basilar airspace opacity may reflect atelectasis. Suspect small left pleural effusion.  CT ANGIOGRAPHY CHEST WITH CONTRAST (06/04/2018) IMPRESSION: 1. No evidence of pulmonary embolus. 2. Left lower lobe pneumonia noted. Minimal opacity at the right lung base. 3. Aneurysmal dilatation of the ascending thoracic aorta to 4.6 cm in AP dimension. Ascending thoracic aortic aneurysm. Recommend semi-annual imaging followup by CTA or MRA and referral to cardiothoracic surgery if not already obtained. This recommendation follows 2010 ACCF/AHA/AATS/ACR/ASA/SCA/SCAI/SIR/STS/SVM Guidelines for the Diagnosis and Management of Patients  With Thoracic Aortic Disease. Circulation. 2010; 121: N629-B284 4. Scattered coronary artery calcifications seen. 5. Mild bilateral gynecomastia noted.  Nuala Alpha, DO 06/08/2018, 6:44 AM PGY-1, [place] Intern pager: [phone], text pages welcome

## 2018-06-09 ENCOUNTER — Other Ambulatory Visit: Payer: Self-pay | Admitting: *Deleted

## 2018-06-09 ENCOUNTER — Encounter: Payer: Self-pay | Admitting: *Deleted

## 2018-06-09 ENCOUNTER — Ambulatory Visit: Payer: Self-pay | Admitting: *Deleted

## 2018-06-09 ENCOUNTER — Other Ambulatory Visit: Payer: Self-pay | Admitting: Licensed Clinical Social Worker

## 2018-06-09 LAB — CULTURE, BLOOD (ROUTINE X 2)
CULTURE: NO GROWTH
CULTURE: NO GROWTH

## 2018-06-09 NOTE — Patient Outreach (Signed)
[facility]) Care Management  06/09/2018  [name] 03/24/1941 [id number]    Pt discharged from the hospital setting to a SNF and will be followed by a social worker for discharge planning. This discipline will be closed from further assessment at this time. Will notify the primary provided of pt's disposition.   Raina Mina, RN Care Management Coordinator [place] Office [phone]

## 2018-06-09 NOTE — Patient Outreach (Signed)
[facility]) Care Management  [facility] Social Work  06/09/2018  [name] 1941-02-06 [id number]  Subjective:    Objective:   Encounter Medications:  Outpatient Encounter Medications as of 06/09/2018  Medication Sig  . aspirin 81 MG chewable tablet Chew 81 mg by mouth every other day.   . Cephalexin 500 MG tablet Take 1 tablet (500 mg total) by mouth 4 (four) times daily for 3 days.  Marland Kitchen EASY TOUCH PEN NEEDLES 31G X 8 MM MISC USE WITH FLEXPEN AS DIRECTED  . esomeprazole (NEXIUM) 40 MG capsule TAKE 1 CAPSULE BY MOUTH EVERY DAY  . FLOVENT HFA 220 MCG/ACT inhaler INHALE 1 PUFF INTO THE LUNGS TWICE DAILY  . furosemide (LASIX) 40 MG tablet TAKE 1 TABLET BY MOUTH TWICE DAILY. (Patient taking differently: TAKE 2 TABLET BY MOUTH TWICE DAILY.)  . insulin aspart (NOVOLOG) 100 UNIT/ML injection Inject 50 Units into the skin 2 (two) times daily.   . Insulin Detemir (LEVEMIR FLEXTOUCH) 100 UNIT/ML Pen Inject 20 Units into the skin daily. (Patient taking differently: Inject 50 Units into the skin 2 (two) times daily. )  . Lancet Devices (ONE TOUCH DELICA LANCING DEV) MISC 75 Units by Does not apply route 2 (two) times daily.  Marland Kitchen latanoprost (XALATAN) 0.005 % ophthalmic solution INSTILL 1 DROP IN BOTH EYES EVERY EVENING FOR GLAUCOMA  . metoprolol tartrate (LOPRESSOR) 100 MG tablet Take 50 mg by mouth 2 (two) times daily.  . mometasone (NASONEX) 50 MCG/ACT nasal spray INSTILL 2 SPRAYS INTO THE NOSE DAILY  . ONE TOUCH ULTRA TEST test strip USE TO CHECK BLOOD SUGAR FOUR TIMES A DAY  . ONE TOUCH ULTRA TEST test strip USE TO TEST BLOOD SUGAR TWICE DAILY  . ONETOUCH DELICA LANCETS 03T MISC USE TO CHECK BLOOD SUGAR TWICE DAILY.  . pravastatin (PRAVACHOL) 40 MG tablet TAKE 1 TABLET BY MOUTH DAILY  . PROAIR HFA 108 (90 Base) MCG/ACT inhaler INHALE 2 PUFFS BY MOUTH EVERY 4 HOURS AS NEEDED FOR WHEEZING OR SHORTNESS OF BREATH  . tamsulosin (FLOMAX) 0.4 MG CAPS capsule TAKE 2 CAPSULES BY MOUTH  EVERY MORNING  . traMADol (ULTRAM) 50 MG tablet TAKE 1 TABLET BY MOUTH 2 TIMES A DAY AS NEEDED FOR PAIN   No facility-administered encounter medications on file as of 06/09/2018.     Functional Status:  In your present state of health, do you have any difficulty performing the following activities: 06/04/2018 05/19/2018  Hearing? N N  Vision? N N  Difficulty concentrating or making decisions? N N  Walking or climbing stairs? Y Y  Dressing or bathing? Y Y  Doing errands, shopping? Y Y  Comment - -  Some recent data might be hidden    Fall/Depression Screening:  PHQ 2/9 Scores 05/08/2017 03/20/2017 11/25/2016 11/08/2016 11/08/2016 10/25/2016 04/11/2016  PHQ - 2 Score 0 0 0 0 0 0 0    Assessment:    CSW traveled to [facility]  and [facility] on 06/09/18.  CSW met with client on 06/09/18 at client's room at nursing center.  Client is receiving nursing care and physical therapy support. Client is hoping to receive care at nursing center for a few weeks and then hopes to return home when able with needed supports in place. Client had been residing alone previously at his home in [place], [place]. He has some family support. CSW encouraged client to communicate with facility social worker to finalize client discharge plans.  Client said he has a motorized wheelchair  at home.  Client has shower seat, reacher, walker, oxygen tanks, and sock aide.Client is working on standing and walking  short distances in physical therapy   He said he has skin breakdown on his left lower thigh and on his right lower thigh.  CSW gave client [facility] CSW card and encouraged client to call CSW as needed to discuss social work needs of client.  CSW thanked client for allowing CSW to visit client at nursing center on 06/09/18.  Plan:   CSW to call client in 2 weeks to assess discharge plans for client at that time.  Norva Riffle.Pritika Alvarez MSW, LCSW Licensed Clinical Social Worker [facility] Care  Management [phone]

## 2018-06-15 ENCOUNTER — Ambulatory Visit: Payer: PPO | Admitting: Internal Medicine

## 2018-06-22 ENCOUNTER — Other Ambulatory Visit: Payer: Self-pay | Admitting: Licensed Clinical Social Worker

## 2018-06-22 NOTE — Patient Outreach (Signed)
Assessment:  CSW spoke via phone with client CSW verified client identity. CSW received verbal permission from client for CSW to speak wihi client about client needs. Client said he is scheduled to discharge from facility on 06/26/18. He plans to return home upon discharge from nursing facility.  Client said his legs are healed well.  He said he is still receiving physical therapy as scheduled at facility. He said he has made transport arrangements for him to transport him on 06/26/18.  Client said he is eating well.  He said that he has a home health aide who assists him with care needs, as scheduled, in the home environment. He said he did not think home health physical therapy would be ordered for him at discharge.  CSW has talked with client about [facility] program services in nursing,  social work and pharmacy. Client has [facility] card and CSW encouraged client to call CSW as needed to discuss social work needs of client.  CSW thanked client for phone call with CSW on 06/22/18.   Plan:  CSW to call client on 06/26/18 to determine client discharge status at that time.  Norva Riffle.Deklen Popelka MSW, LCSW Licensed Clinical Social Worker [facility] Care Management [phone]

## 2018-06-26 ENCOUNTER — Other Ambulatory Visit: Payer: Self-pay | Admitting: Licensed Clinical Social Worker

## 2018-06-26 NOTE — Patient Outreach (Signed)
Assessment:  CSW spoke via phone with client on 06/26/18.  Client has been receiving care at [facility]. He has been receiving nursing care at facility. He has been receiving physical therapy sessions, as scheduled at facility. Client reported to [facility] on 06/26/18 that client plans to discharge from that nursing center on 06/26/18 in the afternoon. After discharge from nursing center, client plans to return to his home in [place], [place].  Client said that he has a home health aide who will help him as scheduled in the home.  He said he was not scheduled to receive home health physical therapy on his discharge from nursing facility. CSW [name] informed [name] on 06/26/18 that [facility] would close his CSW support to client on 06/26/18.  However, CSW [name] informed [name] that [name] would make a CSW referral for client in order for client to receive CSW support when he returns to his home in [place], [place]. Client agreed to this plan. CSW thanked client for phone call with CSW on 06/26/18. [name] was appreciative of call from [name].   Plan:  CSW [name] is closing his CSW services to client on 06/26/18.  CSW [name] to make a CSW referral for CSW to monitor client needs in home environment.  [name].[name] MSW, LCSW Licensed Clinical Social Worker [facility] Care Management [phone]

## 2018-06-27 DIAGNOSIS — S82209A Unspecified fracture of shaft of unspecified tibia, initial encounter for closed fracture: Secondary | ICD-10-CM | POA: Diagnosis not present

## 2018-06-27 DIAGNOSIS — R0609 Other forms of dyspnea: Secondary | ICD-10-CM | POA: Diagnosis not present

## 2018-06-27 DIAGNOSIS — R0689 Other abnormalities of breathing: Secondary | ICD-10-CM | POA: Diagnosis not present

## 2018-06-27 DIAGNOSIS — R269 Unspecified abnormalities of gait and mobility: Secondary | ICD-10-CM | POA: Diagnosis not present

## 2018-06-27 DIAGNOSIS — E1165 Type 2 diabetes mellitus with hyperglycemia: Secondary | ICD-10-CM | POA: Diagnosis not present

## 2018-06-27 DIAGNOSIS — J449 Chronic obstructive pulmonary disease, unspecified: Secondary | ICD-10-CM | POA: Diagnosis not present

## 2018-07-06 ENCOUNTER — Telehealth: Payer: Self-pay | Admitting: Family Medicine

## 2018-07-06 DIAGNOSIS — Z9181 History of falling: Secondary | ICD-10-CM | POA: Diagnosis not present

## 2018-07-06 DIAGNOSIS — G894 Chronic pain syndrome: Secondary | ICD-10-CM | POA: Diagnosis not present

## 2018-07-06 DIAGNOSIS — E785 Hyperlipidemia, unspecified: Secondary | ICD-10-CM | POA: Diagnosis not present

## 2018-07-06 DIAGNOSIS — I272 Pulmonary hypertension, unspecified: Secondary | ICD-10-CM | POA: Diagnosis not present

## 2018-07-06 DIAGNOSIS — Z8546 Personal history of malignant neoplasm of prostate: Secondary | ICD-10-CM | POA: Diagnosis not present

## 2018-07-06 DIAGNOSIS — I509 Heart failure, unspecified: Secondary | ICD-10-CM | POA: Diagnosis not present

## 2018-07-06 DIAGNOSIS — K5900 Constipation, unspecified: Secondary | ICD-10-CM | POA: Diagnosis not present

## 2018-07-06 DIAGNOSIS — Z794 Long term (current) use of insulin: Secondary | ICD-10-CM | POA: Diagnosis not present

## 2018-07-06 DIAGNOSIS — D649 Anemia, unspecified: Secondary | ICD-10-CM | POA: Diagnosis not present

## 2018-07-06 DIAGNOSIS — J309 Allergic rhinitis, unspecified: Secondary | ICD-10-CM | POA: Diagnosis not present

## 2018-07-06 DIAGNOSIS — I890 Lymphedema, not elsewhere classified: Secondary | ICD-10-CM | POA: Diagnosis not present

## 2018-07-06 DIAGNOSIS — I110 Hypertensive heart disease with heart failure: Secondary | ICD-10-CM | POA: Diagnosis not present

## 2018-07-06 DIAGNOSIS — E1142 Type 2 diabetes mellitus with diabetic polyneuropathy: Secondary | ICD-10-CM | POA: Diagnosis not present

## 2018-07-06 DIAGNOSIS — K219 Gastro-esophageal reflux disease without esophagitis: Secondary | ICD-10-CM | POA: Diagnosis not present

## 2018-07-06 DIAGNOSIS — M1990 Unspecified osteoarthritis, unspecified site: Secondary | ICD-10-CM | POA: Diagnosis not present

## 2018-07-06 DIAGNOSIS — H401130 Primary open-angle glaucoma, bilateral, stage unspecified: Secondary | ICD-10-CM | POA: Diagnosis not present

## 2018-07-06 NOTE — Telephone Encounter (Signed)
Left VM.  Harriet Butte, [place], PGY-3

## 2018-07-06 NOTE — Telephone Encounter (Signed)
Leigh Aurora a nurse with [facility] called asking to have orders placed for the pt for in home care.   She asked that orders are placed for :  Wound care for his legs. Aquacel 2 times a week for 9 weeks.   The best number to contact her at is [phone]

## 2018-07-07 ENCOUNTER — Other Ambulatory Visit: Payer: Self-pay | Admitting: Internal Medicine

## 2018-07-07 ENCOUNTER — Other Ambulatory Visit (INDEPENDENT_AMBULATORY_CARE_PROVIDER_SITE_OTHER): Payer: Self-pay | Admitting: Orthopedic Surgery

## 2018-07-09 NOTE — Telephone Encounter (Signed)
Refill

## 2018-07-09 NOTE — Telephone Encounter (Signed)
Ok refill? 

## 2018-07-10 ENCOUNTER — Other Ambulatory Visit: Payer: Self-pay | Admitting: *Deleted

## 2018-07-10 NOTE — Telephone Encounter (Signed)
Please review furosemide.  There are 2 different sigs. Dominyck Reser, Salome Spotted, CMA

## 2018-07-13 MED ORDER — FUROSEMIDE 40 MG PO TABS: 40.0000 mg | ORAL_TABLET | Freq: Two times a day (BID) | ORAL | 3 refills | Status: DC

## 2018-07-13 MED ORDER — NYSTATIN 100000 UNIT/GM EX POWD: Freq: Two times a day (BID) | CUTANEOUS | 0 refills | Status: AC

## 2018-07-27 DIAGNOSIS — S82209A Unspecified fracture of shaft of unspecified tibia, initial encounter for closed fracture: Secondary | ICD-10-CM | POA: Diagnosis not present

## 2018-07-27 DIAGNOSIS — E1165 Type 2 diabetes mellitus with hyperglycemia: Secondary | ICD-10-CM | POA: Diagnosis not present

## 2018-07-27 DIAGNOSIS — R0689 Other abnormalities of breathing: Secondary | ICD-10-CM | POA: Diagnosis not present

## 2018-07-27 DIAGNOSIS — R269 Unspecified abnormalities of gait and mobility: Secondary | ICD-10-CM | POA: Diagnosis not present

## 2018-07-27 DIAGNOSIS — J449 Chronic obstructive pulmonary disease, unspecified: Secondary | ICD-10-CM | POA: Diagnosis not present

## 2018-07-27 DIAGNOSIS — R0609 Other forms of dyspnea: Secondary | ICD-10-CM | POA: Diagnosis not present

## 2018-07-28 ENCOUNTER — Emergency Department (HOSPITAL_COMMUNITY): Payer: PPO

## 2018-07-28 ENCOUNTER — Other Ambulatory Visit: Payer: Self-pay

## 2018-07-28 ENCOUNTER — Inpatient Hospital Stay (HOSPITAL_COMMUNITY)
Admission: EM | Admit: 2018-07-28 | Discharge: 2018-08-07 | DRG: 871 | Disposition: A | Discharge status: Skilled Nursing Facility | Payer: PPO | Attending: Family Medicine | Admitting: Family Medicine

## 2018-07-28 DIAGNOSIS — C61 Malignant neoplasm of prostate: Secondary | ICD-10-CM | POA: Diagnosis present

## 2018-07-28 DIAGNOSIS — Z4682 Encounter for fitting and adjustment of non-vascular catheter: Secondary | ICD-10-CM | POA: Diagnosis not present

## 2018-07-28 DIAGNOSIS — Z6841 Body Mass Index (BMI) 40.0 and over, adult: Secondary | ICD-10-CM | POA: Diagnosis not present

## 2018-07-28 DIAGNOSIS — I890 Lymphedema, not elsewhere classified: Secondary | ICD-10-CM

## 2018-07-28 DIAGNOSIS — I519 Heart disease, unspecified: Secondary | ICD-10-CM | POA: Diagnosis present

## 2018-07-28 DIAGNOSIS — I5189 Other ill-defined heart diseases: Secondary | ICD-10-CM | POA: Diagnosis present

## 2018-07-28 DIAGNOSIS — E1142 Type 2 diabetes mellitus with diabetic polyneuropathy: Secondary | ICD-10-CM | POA: Diagnosis present

## 2018-07-28 DIAGNOSIS — I272 Pulmonary hypertension, unspecified: Secondary | ICD-10-CM | POA: Diagnosis present

## 2018-07-28 DIAGNOSIS — D638 Anemia in other chronic diseases classified elsewhere: Secondary | ICD-10-CM | POA: Diagnosis present

## 2018-07-28 DIAGNOSIS — N10 Acute tubulo-interstitial nephritis: Secondary | ICD-10-CM | POA: Diagnosis not present

## 2018-07-28 DIAGNOSIS — A021 Salmonella sepsis: Secondary | ICD-10-CM | POA: Diagnosis not present

## 2018-07-28 DIAGNOSIS — G8929 Other chronic pain: Secondary | ICD-10-CM | POA: Diagnosis present

## 2018-07-28 DIAGNOSIS — R6520 Severe sepsis without septic shock: Secondary | ICD-10-CM | POA: Diagnosis not present

## 2018-07-28 DIAGNOSIS — J9811 Atelectasis: Secondary | ICD-10-CM | POA: Diagnosis present

## 2018-07-28 DIAGNOSIS — R601 Generalized edema: Secondary | ICD-10-CM | POA: Diagnosis not present

## 2018-07-28 DIAGNOSIS — Z7982 Long term (current) use of aspirin: Secondary | ICD-10-CM

## 2018-07-28 DIAGNOSIS — Z794 Long term (current) use of insulin: Secondary | ICD-10-CM

## 2018-07-28 DIAGNOSIS — J189 Pneumonia, unspecified organism: Secondary | ICD-10-CM | POA: Diagnosis present

## 2018-07-28 DIAGNOSIS — Q219 Congenital malformation of cardiac septum, unspecified: Secondary | ICD-10-CM

## 2018-07-28 DIAGNOSIS — N179 Acute kidney failure, unspecified: Secondary | ICD-10-CM | POA: Diagnosis not present

## 2018-07-28 DIAGNOSIS — Z602 Problems related to living alone: Secondary | ICD-10-CM

## 2018-07-28 DIAGNOSIS — I447 Left bundle-branch block, unspecified: Secondary | ICD-10-CM | POA: Diagnosis not present

## 2018-07-28 DIAGNOSIS — E872 Acidosis: Secondary | ICD-10-CM | POA: Diagnosis not present

## 2018-07-28 DIAGNOSIS — R7989 Other specified abnormal findings of blood chemistry: Secondary | ICD-10-CM | POA: Diagnosis not present

## 2018-07-28 DIAGNOSIS — Z9989 Dependence on other enabling machines and devices: Secondary | ICD-10-CM

## 2018-07-28 DIAGNOSIS — L97929 Non-pressure chronic ulcer of unspecified part of left lower leg with unspecified severity: Secondary | ICD-10-CM | POA: Diagnosis present

## 2018-07-28 DIAGNOSIS — D630 Anemia in neoplastic disease: Secondary | ICD-10-CM | POA: Diagnosis present

## 2018-07-28 DIAGNOSIS — J181 Lobar pneumonia, unspecified organism: Secondary | ICD-10-CM | POA: Diagnosis not present

## 2018-07-28 DIAGNOSIS — I959 Hypotension, unspecified: Secondary | ICD-10-CM | POA: Diagnosis not present

## 2018-07-28 DIAGNOSIS — J9602 Acute respiratory failure with hypercapnia: Secondary | ICD-10-CM | POA: Diagnosis present

## 2018-07-28 DIAGNOSIS — I5043 Acute on chronic combined systolic (congestive) and diastolic (congestive) heart failure: Secondary | ICD-10-CM | POA: Diagnosis not present

## 2018-07-28 DIAGNOSIS — Z8249 Family history of ischemic heart disease and other diseases of the circulatory system: Secondary | ICD-10-CM

## 2018-07-28 DIAGNOSIS — N189 Chronic kidney disease, unspecified: Secondary | ICD-10-CM

## 2018-07-28 DIAGNOSIS — I472 Ventricular tachycardia: Secondary | ICD-10-CM | POA: Diagnosis not present

## 2018-07-28 DIAGNOSIS — R710 Precipitous drop in hematocrit: Secondary | ICD-10-CM | POA: Diagnosis present

## 2018-07-28 DIAGNOSIS — K8020 Calculus of gallbladder without cholecystitis without obstruction: Secondary | ICD-10-CM | POA: Diagnosis not present

## 2018-07-28 DIAGNOSIS — G4733 Obstructive sleep apnea (adult) (pediatric): Secondary | ICD-10-CM

## 2018-07-28 DIAGNOSIS — R531 Weakness: Secondary | ICD-10-CM | POA: Diagnosis not present

## 2018-07-28 DIAGNOSIS — R079 Chest pain, unspecified: Secondary | ICD-10-CM | POA: Diagnosis not present

## 2018-07-28 DIAGNOSIS — L89153 Pressure ulcer of sacral region, stage 3: Secondary | ICD-10-CM | POA: Diagnosis not present

## 2018-07-28 DIAGNOSIS — Z452 Encounter for adjustment and management of vascular access device: Secondary | ICD-10-CM

## 2018-07-28 DIAGNOSIS — I110 Hypertensive heart disease with heart failure: Secondary | ICD-10-CM | POA: Diagnosis present

## 2018-07-28 DIAGNOSIS — Z79899 Other long term (current) drug therapy: Secondary | ICD-10-CM

## 2018-07-28 DIAGNOSIS — Z8701 Personal history of pneumonia (recurrent): Secondary | ICD-10-CM

## 2018-07-28 DIAGNOSIS — E11649 Type 2 diabetes mellitus with hypoglycemia without coma: Secondary | ICD-10-CM | POA: Diagnosis not present

## 2018-07-28 DIAGNOSIS — I471 Supraventricular tachycardia: Secondary | ICD-10-CM | POA: Diagnosis not present

## 2018-07-28 DIAGNOSIS — J9601 Acute respiratory failure with hypoxia: Secondary | ICD-10-CM | POA: Diagnosis not present

## 2018-07-28 DIAGNOSIS — Z4659 Encounter for fitting and adjustment of other gastrointestinal appliance and device: Secondary | ICD-10-CM

## 2018-07-28 DIAGNOSIS — I462 Cardiac arrest due to underlying cardiac condition: Secondary | ICD-10-CM | POA: Diagnosis not present

## 2018-07-28 DIAGNOSIS — Z01818 Encounter for other preprocedural examination: Secondary | ICD-10-CM

## 2018-07-28 DIAGNOSIS — Z7401 Bed confinement status: Secondary | ICD-10-CM | POA: Diagnosis not present

## 2018-07-28 DIAGNOSIS — B372 Candidiasis of skin and nail: Secondary | ICD-10-CM | POA: Diagnosis not present

## 2018-07-28 DIAGNOSIS — E860 Dehydration: Secondary | ICD-10-CM | POA: Diagnosis present

## 2018-07-28 DIAGNOSIS — B49 Unspecified mycosis: Secondary | ICD-10-CM | POA: Diagnosis present

## 2018-07-28 DIAGNOSIS — M2550 Pain in unspecified joint: Secondary | ICD-10-CM | POA: Diagnosis not present

## 2018-07-28 DIAGNOSIS — A419 Sepsis, unspecified organism: Secondary | ICD-10-CM | POA: Diagnosis present

## 2018-07-28 DIAGNOSIS — R460 Very low level of personal hygiene: Secondary | ICD-10-CM | POA: Diagnosis present

## 2018-07-28 DIAGNOSIS — Z888 Allergy status to other drugs, medicaments and biological substances status: Secondary | ICD-10-CM

## 2018-07-28 DIAGNOSIS — Z96643 Presence of artificial hip joint, bilateral: Secondary | ICD-10-CM | POA: Diagnosis present

## 2018-07-28 DIAGNOSIS — B3749 Other urogenital candidiasis: Secondary | ICD-10-CM | POA: Diagnosis present

## 2018-07-28 DIAGNOSIS — J9690 Respiratory failure, unspecified, unspecified whether with hypoxia or hypercapnia: Secondary | ICD-10-CM

## 2018-07-28 DIAGNOSIS — L304 Erythema intertrigo: Secondary | ICD-10-CM | POA: Diagnosis present

## 2018-07-28 DIAGNOSIS — Z7409 Other reduced mobility: Secondary | ICD-10-CM | POA: Diagnosis present

## 2018-07-28 DIAGNOSIS — J810 Acute pulmonary edema: Secondary | ICD-10-CM | POA: Diagnosis not present

## 2018-07-28 DIAGNOSIS — E785 Hyperlipidemia, unspecified: Secondary | ICD-10-CM | POA: Diagnosis present

## 2018-07-28 DIAGNOSIS — I700 Atherosclerosis of aorta: Secondary | ICD-10-CM | POA: Diagnosis present

## 2018-07-28 DIAGNOSIS — R0902 Hypoxemia: Secondary | ICD-10-CM | POA: Diagnosis not present

## 2018-07-28 DIAGNOSIS — K219 Gastro-esophageal reflux disease without esophagitis: Secondary | ICD-10-CM | POA: Diagnosis present

## 2018-07-28 DIAGNOSIS — J9600 Acute respiratory failure, unspecified whether with hypoxia or hypercapnia: Secondary | ICD-10-CM | POA: Diagnosis not present

## 2018-07-28 DIAGNOSIS — I872 Venous insufficiency (chronic) (peripheral): Secondary | ICD-10-CM | POA: Diagnosis present

## 2018-07-28 DIAGNOSIS — I469 Cardiac arrest, cause unspecified: Secondary | ICD-10-CM | POA: Diagnosis not present

## 2018-07-28 HISTORY — DX: Other chronic osteomyelitis, unspecified tibia and fibula: M86.669

## 2018-07-28 HISTORY — DX: Other ill-defined heart diseases: I51.89

## 2018-07-28 HISTORY — DX: Heart disease, unspecified: I51.9

## 2018-07-28 HISTORY — DX: Type 2 diabetes mellitus with diabetic polyneuropathy: E11.42

## 2018-07-28 LAB — CBC WITH DIFFERENTIAL/PLATELET
Abs Immature Granulocytes: 0.2 10*3/uL — ABNORMAL HIGH (ref 0.0–0.1)
BASOS ABS: 0 10*3/uL (ref 0.0–0.1)
Basophils Relative: 0 %
EOS ABS: 0.1 10*3/uL (ref 0.0–0.7)
Eosinophils Relative: 0 %
HCT: 35.1 % — ABNORMAL LOW (ref 39.0–52.0)
Hemoglobin: 10.7 g/dL — ABNORMAL LOW (ref 13.0–17.0)
Immature Granulocytes: 1 %
Lymphocytes Relative: 2 %
Lymphs Abs: 0.5 10*3/uL — ABNORMAL LOW (ref 0.7–4.0)
MCH: 26 pg (ref 26.0–34.0)
MCHC: 30.5 g/dL (ref 30.0–36.0)
MCV: 85.4 fL (ref 78.0–100.0)
MONO ABS: 1.4 10*3/uL — AB (ref 0.1–1.0)
Monocytes Relative: 7 %
Neutro Abs: 18.7 10*3/uL — ABNORMAL HIGH (ref 1.7–7.7)
Neutrophils Relative %: 90 %
Platelets: 313 10*3/uL (ref 150–400)
RBC: 4.11 MIL/uL — AB (ref 4.22–5.81)
RDW: 16.2 % — AB (ref 11.5–15.5)
WBC: 20.9 10*3/uL — AB (ref 4.0–10.5)

## 2018-07-28 LAB — BLOOD GAS, ARTERIAL
Acid-base deficit: 0.2 mmol/L (ref 0.0–2.0)
BICARBONATE: 25.8 mmol/L (ref 20.0–28.0)
Drawn by: 347191
O2 Content: 2 L/min
O2 Saturation: 97.8 %
PATIENT TEMPERATURE: 98.2
PO2 ART: 118 mmHg — AB (ref 83.0–108.0)
pCO2 arterial: 57.1 mmHg — ABNORMAL HIGH (ref 32.0–48.0)
pH, Arterial: 7.277 — ABNORMAL LOW (ref 7.350–7.450)

## 2018-07-28 LAB — PROTIME-INR
INR: 1.3
Prothrombin Time: 16.1 seconds — ABNORMAL HIGH (ref 11.4–15.2)

## 2018-07-28 LAB — COMPREHENSIVE METABOLIC PANEL
ALT: 18 U/L (ref 0–44)
AST: 41 U/L (ref 15–41)
Albumin: 2.5 g/dL — ABNORMAL LOW (ref 3.5–5.0)
Alkaline Phosphatase: 115 U/L (ref 38–126)
Anion gap: 16 — ABNORMAL HIGH (ref 5–15)
BUN: 60 mg/dL — ABNORMAL HIGH (ref 8–23)
CO2: 22 mmol/L (ref 22–32)
CREATININE: 2.45 mg/dL — AB (ref 0.61–1.24)
Calcium: 8.8 mg/dL — ABNORMAL LOW (ref 8.9–10.3)
Chloride: 99 mmol/L (ref 98–111)
GFR calc non Af Amer: 24 mL/min — ABNORMAL LOW (ref >60)
GFR, EST AFRICAN AMERICAN: 27 mL/min — AB (ref >60)
Glucose, Bld: 204 mg/dL — ABNORMAL HIGH (ref 70–99)
Potassium: 4.7 mmol/L (ref 3.5–5.1)
Sodium: 137 mmol/L (ref 135–145)
Total Bilirubin: 1.5 mg/dL — ABNORMAL HIGH (ref 0.3–1.2)
Total Protein: 8.1 g/dL (ref 6.5–8.1)

## 2018-07-28 LAB — URINALYSIS, ROUTINE W REFLEX MICROSCOPIC
Bilirubin Urine: NEGATIVE
Glucose, UA: NEGATIVE mg/dL
KETONES UR: 5 mg/dL — AB
NITRITE: NEGATIVE
PH: 5 (ref 5.0–8.0)
Protein, ur: NEGATIVE mg/dL
Specific Gravity, Urine: 1.016 (ref 1.005–1.030)

## 2018-07-28 LAB — I-STAT CHEM 8, ED
BUN: 83 mg/dL — ABNORMAL HIGH (ref 8–23)
CALCIUM ION: 1.08 mmol/L — AB (ref 1.15–1.40)
CREATININE: 2.2 mg/dL — AB (ref 0.61–1.24)
Chloride: 101 mmol/L (ref 98–111)
GLUCOSE: 209 mg/dL — AB (ref 70–99)
HCT: 35 % — ABNORMAL LOW (ref 39.0–52.0)
HEMOGLOBIN: 11.9 g/dL — AB (ref 13.0–17.0)
Potassium: 5.4 mmol/L — ABNORMAL HIGH (ref 3.5–5.1)
Sodium: 137 mmol/L (ref 135–145)
TCO2: 26 mmol/L (ref 22–32)

## 2018-07-28 LAB — I-STAT CG4 LACTIC ACID, ED
Lactic Acid, Venous: 3.25 mmol/L (ref 0.5–1.9)
Lactic Acid, Venous: 1.74 mmol/L (ref 0.5–1.9)

## 2018-07-28 LAB — I-STAT TROPONIN, ED
TROPONIN I, POC: 0 ng/mL (ref 0.00–0.08)
TROPONIN I, POC: 0 ng/mL (ref 0.00–0.08)

## 2018-07-28 LAB — GLUCOSE, CAPILLARY: Glucose-Capillary: 190 mg/dL — ABNORMAL HIGH (ref 70–99)

## 2018-07-28 MED ORDER — PIPERACILLIN-TAZOBACTAM 3.375 G IVPB
3.3750 g | Freq: Three times a day (TID) | INTRAVENOUS | Status: DC
  Administered 2018-07-28 – 2018-07-29 (×4): 3.375 g via INTRAVENOUS
  Filled 2018-07-28 (×5): qty 50

## 2018-07-28 MED ORDER — SODIUM CHLORIDE 0.9 % IV BOLUS (SEPSIS)
1000.0000 mL | Freq: Once | INTRAVENOUS | Status: AC
  Administered 2018-07-28: 1000 mL via INTRAVENOUS

## 2018-07-28 MED ORDER — ENOXAPARIN SODIUM 30 MG/0.3ML ~~LOC~~ SOLN
30.0000 mg | SUBCUTANEOUS | Status: DC
  Administered 2018-07-28: 30 mg via SUBCUTANEOUS
  Filled 2018-07-28: qty 0.3

## 2018-07-28 MED ORDER — VANCOMYCIN HCL 10 G IV SOLR
1500.0000 mg | INTRAVENOUS | Status: DC
  Filled 2018-07-28: qty 1500

## 2018-07-28 MED ORDER — ACETAMINOPHEN 650 MG RE SUPP: 650.0000 mg | Freq: Four times a day (QID) | RECTAL | Status: DC | PRN

## 2018-07-28 MED ORDER — PIPERACILLIN-TAZOBACTAM 3.375 G IVPB: 3.3750 g | Freq: Three times a day (TID) | INTRAVENOUS | Status: DC

## 2018-07-28 MED ORDER — NYSTATIN 100000 UNIT/GM EX POWD
Freq: Two times a day (BID) | CUTANEOUS | Status: DC
  Administered 2018-07-28 – 2018-07-29 (×3): via TOPICAL
  Administered 2018-07-30: 1 g via TOPICAL
  Administered 2018-07-30 – 2018-08-07 (×16): via TOPICAL
  Filled 2018-07-28 (×2): qty 15

## 2018-07-28 MED ORDER — SODIUM CHLORIDE 0.9 % IV BOLUS (SEPSIS)
400.0000 mL | Freq: Once | INTRAVENOUS | Status: AC
  Administered 2018-07-28: 400 mL via INTRAVENOUS

## 2018-07-28 MED ORDER — VANCOMYCIN HCL 10 G IV SOLR
2500.0000 mg | Freq: Once | INTRAVENOUS | Status: AC
  Administered 2018-07-28: 2500 mg via INTRAVENOUS
  Filled 2018-07-28: qty 2500

## 2018-07-28 MED ORDER — PIPERACILLIN-TAZOBACTAM 3.375 G IVPB 30 MIN
3.3750 g | Freq: Once | INTRAVENOUS | Status: AC
  Administered 2018-07-28: 3.375 g via INTRAVENOUS
  Filled 2018-07-28: qty 50

## 2018-07-28 MED ORDER — ACETAMINOPHEN 325 MG PO TABS
650.0000 mg | ORAL_TABLET | Freq: Four times a day (QID) | ORAL | Status: DC | PRN
  Administered 2018-08-06 – 2018-08-07 (×2): 650 mg via ORAL
  Filled 2018-07-28 (×3): qty 2

## 2018-07-28 MED ORDER — INSULIN DETEMIR 100 UNIT/ML ~~LOC~~ SOLN
10.0000 [IU] | Freq: Two times a day (BID) | SUBCUTANEOUS | Status: DC
  Administered 2018-07-29 – 2018-07-30 (×3): 10 [IU] via SUBCUTANEOUS
  Filled 2018-07-28 (×3): qty 0.1

## 2018-07-28 MED ORDER — SODIUM CHLORIDE 0.9 % IV SOLN
INTRAVENOUS | Status: DC
  Administered 2018-07-28: 22:00:00 via INTRAVENOUS

## 2018-07-28 MED ORDER — FLUCONAZOLE IN SODIUM CHLORIDE 200-0.9 MG/100ML-% IV SOLN
200.0000 mg | INTRAVENOUS | Status: DC
  Administered 2018-07-28: 200 mg via INTRAVENOUS
  Filled 2018-07-28: qty 100

## 2018-07-28 MED ORDER — METOPROLOL TARTRATE 50 MG PO TABS
50.0000 mg | ORAL_TABLET | Freq: Two times a day (BID) | ORAL | Status: DC
  Administered 2018-07-28: 50 mg via ORAL
  Filled 2018-07-28: qty 1

## 2018-07-28 MED ORDER — VANCOMYCIN HCL IN DEXTROSE 1-5 GM/200ML-% IV SOLN
1000.0000 mg | Freq: Once | INTRAVENOUS | Status: DC
  Filled 2018-07-28: qty 200

## 2018-07-28 MED ORDER — BUDESONIDE 0.5 MG/2ML IN SUSP
0.5000 mg | Freq: Two times a day (BID) | RESPIRATORY_TRACT | Status: DC
  Administered 2018-07-29 – 2018-08-05 (×15): 0.5 mg via RESPIRATORY_TRACT
  Filled 2018-07-28 (×16): qty 2

## 2018-07-28 MED ORDER — SODIUM CHLORIDE 0.9 % IV SOLN
INTRAVENOUS | Status: AC
  Administered 2018-07-28: 23:00:00 via INTRAVENOUS

## 2018-07-28 MED ORDER — INSULIN ASPART 100 UNIT/ML ~~LOC~~ SOLN
0.0000 [IU] | Freq: Three times a day (TID) | SUBCUTANEOUS | Status: DC
  Administered 2018-07-29: 2 [IU] via SUBCUTANEOUS
  Administered 2018-07-29: 3 [IU] via SUBCUTANEOUS
  Administered 2018-07-29 – 2018-07-30 (×3): 2 [IU] via SUBCUTANEOUS
  Administered 2018-07-31: 3 [IU] via SUBCUTANEOUS
  Administered 2018-08-02 – 2018-08-03 (×4): 2 [IU] via SUBCUTANEOUS
  Administered 2018-08-03 – 2018-08-04 (×2): 3 [IU] via SUBCUTANEOUS

## 2018-07-28 MED ORDER — TAMSULOSIN HCL 0.4 MG PO CAPS
0.8000 mg | ORAL_CAPSULE | Freq: Every morning | ORAL | Status: DC
  Administered 2018-07-29 – 2018-08-07 (×9): 0.8 mg via ORAL
  Filled 2018-07-28 (×10): qty 2

## 2018-07-28 MED ORDER — PRAVASTATIN SODIUM 40 MG PO TABS
40.0000 mg | ORAL_TABLET | Freq: Every day | ORAL | Status: DC
  Administered 2018-07-28 – 2018-08-07 (×10): 40 mg via ORAL
  Filled 2018-07-28 (×11): qty 1

## 2018-07-28 MED ORDER — FLUTICASONE PROPIONATE HFA 220 MCG/ACT IN AERO: 1.0000 | INHALATION_SPRAY | Freq: Two times a day (BID) | RESPIRATORY_TRACT | Status: DC

## 2018-07-28 NOTE — ED Provider Notes (Signed)
[facility] EMERGENCY DEPARTMENT Provider Note   CSN: [id number] Arrival date & time: 07/28/18  1333     History   Chief Complaint Chief Complaint  Patient presents with  . Weakness    HPI [name] is a 77 y.o. male.  HPI Patient's daughter is main historian.  Patient lives independently at home but is significantly impaired.  He uses a wheelchair and does do transfers and to his chair to sleep.  He reports he is able to prepare food but cannot do much else for self-care.  She last saw him 4 days ago.  She reports at that time he seemed slightly ill but did not want to seek any medical care.  Worked over the weekend and went to check on him today.  Reports that he was extremely lethargic and could not get himself up out of his chair.  Patient is a very poor historian.  He is alert enough to deny any pain.  He however cannot specify any of the symptoms that of been developing.  Patient's daughter reports that other people live at the same apartment, said that they thought he was doing badly all weekend.  She reports that EMS had been called on Saturday but apparently he refused transport.  She reports she was not aware that that it happened until today. Past Medical History:  Diagnosis Date  . COLON POLYP 02/26/2007   Qualifier: Diagnosis of  By: Erling Cruz  MD, MELISSA    . Complication of anesthesia   . Diabetes mellitus   . GERD (gastroesophageal reflux disease)   . Heart murmur    last 2D Echo -03/31/08  . HIATAL HERNIA WITH REFLUX 09/29/2006   Qualifier: Diagnosis of  By: Erling Cruz  MD, MELISSA    . HIP REPLACEMENT, BILATERAL, HX OF 05/12/2007   Qualifier: Diagnosis of  By: Erling Cruz  MD, MELISSA    . History of blood transfusion   . Hypertension   . OSTEOARTHRITIS 03/05/2007   Qualifier: Diagnosis of  By: Erling Cruz  MD, MELISSA    . PONV (postoperative nausea and vomiting)   . Prostate cancer ([place])   . SBO (small bowel obstruction) ([place]) 02/2017  . Seasonal allergies   .  VENOUS INSUFFICIENCY, CHRONIC 02/26/2007   Qualifier: Diagnosis of  By: Erling Cruz  MD, MELISSA      Patient Active Problem List   Diagnosis Date Noted  . Sepsis ([place]) 07/28/2018  . Pneumonia 06/04/2018  . Dehydration   . HCAP (healthcare-associated pneumonia)   . Infestation by insect   . Hypotension   . Acute on chronic combined systolic and diastolic congestive heart failure ([place])   . Fluid overload 05/19/2018  . Elephantiasis nostra verrucosa 05/19/2018  . Pressure ulcer of upper thigh, right, stage III ([place]) 05/19/2018  . Intertrigo 05/19/2018  . Chronic venous insufficiency, Bilateral Legs 05/19/2018  . Elevated PSA, greater than or equal to 20 ng/ml 05/19/2018  . History of prostate surgery, Per Patient report, [facility] 05/19/2018  . Infestation by bed bug 05/19/2018  . Lives alone with limited help 05/19/2018  . Morbid obesity ([place]) 05/19/2018  . Dyspnea and respiratory abnormality 05/19/2018  . Systolic ejection murmur with radiation into Cartoids bilaterally 05/19/2018  . Paget's bone disease 05/19/2018  . Pulmonary hypertension ([place]) 05/19/2018  . Hypoxia   . AKI (acute kidney injury) ([place])   . Elephantiasis nostras 03/29/2016  . Impaired mobility 09/09/2014  . Diabetic peripheral neuropathy associated with type 2 diabetes mellitus ([place])  02/07/2014  . Ulcer of left lower leg ([place]) 10/05/2012  . Anemia 05/21/2012  . Chronic leg pain 11/17/2011  . Chronic osteomyelitis of lower leg ([employer]) 11/20/2010  . HLD (hyperlipidemia) 12/01/2008  . ADENOCARCINOMA, PROSTATE 05/24/2008  . DM type 2, uncontrolled, with neuropathy ([place]) 01/20/2008  . Morbid obesity with body mass index of 50.0-59.9 in adult ([place]) 03/05/2007  . Essential hypertension 03/05/2007    Past Surgical History:  Procedure Laterality Date  . COLONOSCOPY    . COLONOSCOPY    . HIP ARTHROPLASTY     bil  . I&D EXTREMITY  08/14/2012   Procedure: IRRIGATION AND DEBRIDEMENT EXTREMITY;  Surgeon: Newt Minion, MD;  Location: [facility];  Service: Orthopedics;  Laterality: Right;  Irrigation and Debridement Right tibia, Placement antibiotic beads  . I&D EXTREMITY Left 03/18/2014   Procedure: IRRIGATION AND DEBRIDEMENT EXTREMITY;  Surgeon: Newt Minion, MD;  Location: [facility];  Service: Orthopedics;  Laterality: Left;  Debridement Left Calf Ulcer, Apply Theraskin and Wound VAC  . IRRIGATION AND DEBRIDEMENT ABSCESS Left 03/18/2014   DR DUDA  . LEG SURGERY     ROD for fracture  . LEG WOUND REPAIR / CLOSURE     Beads .  Skin wound  . SKIN GRAFT     right leg- from donor skin  . SKIN GRAFT  1976   R wrist   skin graft from left thigh  . UPPER GASTROINTESTINAL ENDOSCOPY    . WISDOM TOOTH EXTRACTION    . WRIST FUSION     right wrist        Home Medications    Prior to Admission medications   Medication Sig Start Date End Date Taking? Authorizing Provider  aspirin 81 MG chewable tablet Chew 81 mg by mouth every other day.     [provider]  EASY TOUCH PEN NEEDLES 31G X 8 MM MISC USE WITH FLEXPEN AS DIRECTED 01/23/18   Mikell, Jeani Sow, MD  esomeprazole (NEXIUM) 40 MG capsule TAKE 1 CAPSULE BY MOUTH EVERY DAY 03/19/18   Mikell, Jeani Sow, MD  FLOVENT HFA 220 MCG/ACT inhaler INHALE 1 PUFF INTO THE LUNGS TWICE DAILY 10/02/17   Mikell, Jeani Sow, MD  furosemide (LASIX) 40 MG tablet Take 1 tablet (40 mg total) by mouth 2 (two) times daily. 07/13/18   [place] Bing, DO  insulin aspart (NOVOLOG) 100 UNIT/ML injection Inject 50 Units into the skin 2 (two) times daily.     [provider]  Insulin Detemir (LEVEMIR FLEXTOUCH) 100 UNIT/ML Pen Inject 20 Units into the skin daily. Patient taking differently: Inject 50 Units into the skin 2 (two) times daily.  05/23/18   [place] Bussing, MD  Lancet Devices (ONE TOUCH DELICA LANCING DEV) MISC 75 Units by Does not apply route 2 (two) times daily. 04/11/16   Rosemarie Ax, MD  latanoprost (XALATAN) 0.005 % ophthalmic  solution INSTILL 1 DROP IN BOTH EYES EVERY EVENING FOR GLAUCOMA 07/08/18   [place] Bing, DO  metoprolol tartrate (LOPRESSOR) 100 MG tablet Take 50 mg by mouth 2 (two) times daily.    [provider]  mometasone (NASONEX) 50 MCG/ACT nasal spray INSTILL 2 SPRAYS INTO THE NOSE DAILY 07/08/18   [place] Bing, DO  nystatin (MYCOSTATIN/NYSTOP) powder Apply topically 2 (two) times daily. 07/13/18    Bing, DO  ONE TOUCH ULTRA TEST test strip USE TO CHECK BLOOD SUGAR FOUR TIMES A DAY 09/22/15   Rosemarie Ax, MD  ONE TOUCH ULTRA TEST test strip USE TO TEST BLOOD SUGAR TWICE DAILY 02/19/18   Mikell, Jeani Sow, MD  [facility] DELICA LANCETS 88K MISC USE TO CHECK BLOOD SUGAR TWICE DAILY. 07/08/18   [place] Bing, DO  pravastatin (PRAVACHOL) 40 MG tablet TAKE 1 TABLET BY MOUTH DAILY 12/26/17   Mikell, Jeani Sow, MD  PROAIR HFA 108 [phone] Base) MCG/ACT inhaler INHALE 2 PUFFS BY MOUTH EVERY 4 HOURS AS NEEDED FOR WHEEZING OR SHORTNESS OF BREATH 04/18/18   Mikell, Jeani Sow, MD  tamsulosin (FLOMAX) 0.4 MG CAPS capsule TAKE 2 CAPSULES BY MOUTH EVERY MORNING 07/08/18   [place] Bing, DO  traMADol (ULTRAM) 50 MG tablet TAKE 1 TABLET BY MOUTH TWICE DAILY AS NEEDED FOR PAIN 07/09/18   Newt Minion, MD    Family History Family History  Problem Relation Age of Onset  . Hypertension Mother     Social History Social History   Tobacco Use  . Smoking status: Never Smoker  . Smokeless tobacco: Never Used  Substance Use Topics  . Alcohol use: No  . Drug use: No     Allergies   Ace inhibitors; Angiotensin receptor blockers; and Metformin   Review of Systems Review of Systems Cannot obtain review of systems level 5 caveat patient confusion. Physical Exam Updated Vital Signs BP 132/67   Pulse 95   Temp (!) 97.4 F (36.3 C) (Rectal)   Resp 16   Ht 6' (1.829 m)   Wt (!) 140.6 kg (310 lb)   SpO2 100%   BMI 42.04 kg/m   Physical Exam  Constitutional:  Patient is  very ill in appearance.  He appears confused and weak.  Significant central obesity and physical deconditioning.  HENT:  Dukas membranes are dry.  Normocephalic atraumatic.  Eyes:  Gaze is symmetric but patient does not follow commands for looking side to side or visual field testing.  Cardiovascular:  Borderline tachycardia.  No gross rub murmur gallop.  Pulmonary/Chest:  Anterior auscultation, no gross rail or rhonchi.  Mild to moderate increased work of breathing at rest.  Abdominal:  Abdomen is morbidly obese.  Patient does not endorse tenderness with palpation.  He however has severe, macerated and purulent candidal rash in all of the pannus folds under the abdomen and in the groin.  Foul-smelling.  Musculoskeletal:  Legs are extremely edematous.  They have prior [place] wrapping dressings.  Neurological:  Patient is very confused.  He predominantly is sleeping.  Can awaken to answer simple questions.  He does not follow commands.  Skin: Skin is warm and dry.     ED Treatments / Results  Labs (all labs ordered are listed, but only abnormal results are displayed) Labs Reviewed  CBC WITH DIFFERENTIAL/PLATELET - Abnormal; Notable for the following components:      Result Value   WBC 20.9 (*)    RBC 4.11 (*)    Hemoglobin 10.7 (*)    HCT 35.1 (*)    RDW 16.2 (*)    Neutro Abs 18.7 (*)    Lymphs Abs 0.5 (*)    Monocytes Absolute 1.4 (*)    Abs Immature Granulocytes 0.2 (*)    All other components within normal limits  COMPREHENSIVE METABOLIC PANEL - Abnormal; Notable for the following components:   Glucose, Bld 204 (*)    BUN 60 (*)    Creatinine, Ser 2.45 (*)    Calcium 8.8 (*)    Albumin 2.5 (*)    Total Bilirubin 1.5 (*)  GFR calc non Af Amer 24 (*)    GFR calc Af Amer 27 (*)    Anion gap 16 (*)    All other components within normal limits  URINALYSIS, ROUTINE W REFLEX MICROSCOPIC - Abnormal; Notable for the following components:   Color, Urine AMBER (*)     APPearance CLOUDY (*)    Hgb urine dipstick SMALL (*)    Ketones, ur 5 (*)    Leukocytes, UA SMALL (*)    Bacteria, UA FEW (*)    All other components within normal limits  PROTIME-INR - Abnormal; Notable for the following components:   Prothrombin Time 16.1 (*)    All other components within normal limits  I-STAT CHEM 8, ED - Abnormal; Notable for the following components:   Potassium 5.4 (*)    BUN 83 (*)    Creatinine, Ser 2.20 (*)    Glucose, Bld 209 (*)    Calcium, Ion 1.08 (*)    Hemoglobin 11.9 (*)    HCT 35.0 (*)    All other components within normal limits  I-STAT CG4 LACTIC ACID, ED - Abnormal; Notable for the following components:   Lactic Acid, Venous 3.25 (*)    All other components within normal limits  CULTURE, BLOOD (ROUTINE X 2)  CULTURE, BLOOD (ROUTINE X 2)  I-STAT TROPONIN, ED  I-STAT TROPONIN, ED  I-STAT CG4 LACTIC ACID, ED  I-STAT CG4 LACTIC ACID, ED  I-STAT CG4 LACTIC ACID, ED    EKG None  Radiology Dg Chest Port 1 View  Result Date: 07/28/2018 CLINICAL DATA:  Sepsis EXAM: PORTABLE CHEST 1 VIEW COMPARISON:  CT chest 06/04/2018, radiograph 06/04/2018, 05/19/2018 FINDINGS: Small left pleural effusion with lingular and left base opacity. Cardiomegaly with mild vascular congestion. No pneumothorax. IMPRESSION: 1. Cardiomegaly with vascular congestion 2. Probable small left effusion. Airspace disease at the lingula and left base may reflect atelectasis or pneumonia. Electronically Signed   By: Donavan Foil M.D.   On: 07/28/2018 16:37    Procedures Procedures (including critical care time) CRITICAL CARE Performed by: Charlesetta Shanks   Total critical care time: 30 minutes  Critical care time was exclusive of separately billable procedures and treating other patients.  Critical care was necessary to treat or prevent imminent or life-threatening deterioration.  Critical care was time spent personally by me on the following activities: development of  treatment plan with patient and/or surrogate as well as nursing, discussions with consultants, evaluation of patient's response to treatment, examination of patient, obtaining history from patient or surrogate, ordering and performing treatments and interventions, ordering and review of laboratory studies, ordering and review of radiographic studies, pulse oximetry and re-evaluation of patient's condition. Medications Ordered in ED Medications  piperacillin-tazobactam (ZOSYN) IVPB 3.375 g (has no administration in time range)  vancomycin (VANCOCIN) 1,500 mg in sodium chloride 0.9 % 500 mL IVPB (has no administration in time range)  fluconazole (DIFLUCAN) IVPB 200 mg (has no administration in time range)  piperacillin-tazobactam (ZOSYN) IVPB 3.375 g (0 g Intravenous Stopped 07/28/18 1653)  sodium chloride 0.9 % bolus 1,000 mL (0 mLs Intravenous Stopped 07/28/18 1731)    And  sodium chloride 0.9 % bolus 1,000 mL (0 mLs Intravenous Stopped 07/28/18 1732)    And  sodium chloride 0.9 % bolus 400 mL (0 mLs Intravenous Stopped 07/28/18 1824)  vancomycin (VANCOCIN) 2,500 mg in sodium chloride 0.9 % 500 mL IVPB (0 mg Intravenous Stopped 07/28/18 1859)     Initial Impression / Assessment and Plan /  ED Course  I have reviewed the triage vital signs and the nursing notes.  Pertinent labs & imaging results that were available during my care of the patient were reviewed by me and considered in my medical decision making (see chart for details).     Consult: Family medicine for admission.  Final Clinical Impressions(s) / ED Diagnoses   Final diagnoses:  Sepsis, due to unspecified organism ([place])  Candidal intertrigo  Recurrent pneumonia  Acute renal failure superimposed on chronic kidney disease, unspecified CKD stage, unspecified acute renal failure type [facility])   Patient presents as outlined above.  He is critically ill in appearance.  His blood pressure and heart rate are stable.  Patient has mild to  moderate increased work of breathing but is maintaining oxygen saturation at 100% on 2 L supplement.  Patient is 20,000 white count, hypothermia and leukocytosis.  Sepsis protocol initiated.  She has severe candidal rash with maceration in the groin folds.  Also, chest x-ray suggests pneumonia.  Patient shows signs of dehydration.  Plan for admission. ED Discharge Orders    None       Charlesetta Shanks, MD 07/28/18 Drema Halon

## 2018-07-28 NOTE — ED Notes (Signed)
Code SEPSIS activated @ 1544

## 2018-07-28 NOTE — ED Notes (Signed)
Helped get patient cleaned up put patient on the monitor patient is resting with call bell in reach

## 2018-07-28 NOTE — ED Triage Notes (Signed)
Per ems pt lives at home with very poor living conditions and takes care of himself. Pt stays in his chair and sleeps in his chair. EMS stated they go out and help him get into his chair. Pt has been weak x 3 days more than usual. Pt is alert oriented x 4 Pt stated he has been having more frequent urination and is very dark. Pt has severe skin break down on his bottom and the back of his legs. 158/74 106 hr, cbg 121

## 2018-07-28 NOTE — H&P (Addendum)
[facility] Admission History and Physical Service Pager: [phone]  Patient name: [name] Medical record number: [id number] Date of birth: 1941/05/20 Age: 77 y.o. Gender: male  Primary Care Provider: Hepzibah Bing, DO Consultants: none Code Status: Full (confirmed by patient in ED)  Chief Complaint: sepsis  Assessment and Plan: [name] is a 77 y.o. male presenting with weakness and lethargy. PMH is significant for prostate adenocarcinoma, HFpEF (EF 60-65%), anemia, BLE lymphadema, T2DM,elephantiasis nostras verrucosaRLE, impaired mobility, morbid obesity, hypertension, HLD, GERD.  Sepsis w/o shock 2/2 UTI/pneumonia/soft tissue infection  Somnolent Patient has experienced several days of increasing fatigue and weakness with no other significant symptoms.  On presentation his vitals were significant for tachycardia to 103 tachypnea to 22.  Chest x-ray was remarkable for vascular congestion in addition to small left effusion suspicious for atelectasis/pneumonia.  UA was positive for leukocyte esterase, negative nitrites, few bacteria, yeast present.  BCs: 20.9, lactic acid: 3.25 trended to 1.74.  Patient met sepsis criteria, so blood cultures were drawn. Vancomycin and Zosyn were started in the ED along with IV diflucan for intertrigo candidiasis. Possible sources of infection at this time include UTI, pneumonia, soft tissue infection from decubitus ulcer on posterior right thigh. Unable to evaluate ulcer on our physical exam. Patient received 2.4L bolus in ED of NS. Urine culture was added on to previous collection obtained prior to start of abx.  -Admit to stepdown, attending Dr. McDiarmid -Continue fluids at 100 mL per hour for 10 hours -Follow-up blood cultures, urine cultures  -Continue Vanc and Zosyn(7/30 - ), will plan to de-escalate as able -Monitor vitals -Up with assistance -Discontinue IV diflucan and will start with oral treatment    SOB  Respiratory Acidosis: New O2 requirement in ED for tachypnea. Initial ABG 7.27/57/118 with bicarb of 22 - consistent with respiratory acidosis. Patient intermittently sedated and hard to awaken. Sternal rub used in ED on our exam to wake patient, ED provider reporting that previously he was awake and interactive. Once awake, answers questions appropriately and follows commands.  -BiPAP qhs for mild respiratory acidosis, patient mentation improving after IVF - Repeat ABG after 1 hr BiPAP - May need OSA work up as outpatient, OHS may also be component  AKI Admission labs show a creatinine of 2.2 from a baseline of 0.8 -0.9. -Light fluid hydration 100 mL's per hour, s/p 2.4L NS bolus in ED -Avoid nephrotoxic agents -Monitor creatinine -Holding home PPI and Lasix   HFpEF, chronic-  Patient has a history of HFpEF (EF 60-65%) on admission patient was tachypneic with a new oxygen requirement of 2 L nasal cannula.  Chest x-ray shows increased vascular congestion with small left pleural effusion.  Home medications include metoprolol 100 mg twice daily, Lasix 40 mg. Dry weight from last admission appears to be 316 lb, patient was 310lb in the ED. Does not appear fluid overloaded on exam, but lung exam difficult due to patient's inability to corporate with exam and body habitus.  -Metoprolol 50 mg twice daily -Follow-up BNP -Strict I's and O's - Daily weights - Holding lasix in setting of AKI - Consider repeat ECHO for possible worsening of CHF  Decubitus ulcer posterior right thigh, previously stage III  Sacral Ulcer Per nursing note patient has significant skin breakdown and posterior thighs gluteal area with stage III ulcer present during last admission.  She was not able to be assessed on physical exam as patient was not participating in the physical  exam and we were not able to move him.  Is a possible source of infection contributing to his current sepsis. -Consult wound care  [place]  Intertrigo of abdominal pannus and intergluteal cleft Patient is morbidly obese and has a significant pannus in addition to history of intertrigo.  On exam today intertrigo appears erythematous and moist without pustules or obvious areas of angry inflammation or drainage. -Topical nystatin, unsure if patient has been complaint at home - s/p IV diflucan in ED x1, will switch to oral fluconazole if indicated  Chronic venous insufficiency  Elephantiasis nostras verrucosaRLE Patient has a significant history of venous insufficiency, no new findings at this time. On exam wraps had been applied to both legs.  -Maintain compression wraps - Wound care consult   Diabetes Patient has a history of type 2 diabetes with a previous A1c of 9.3 on 05/19/2018.  Home medications include Levemir 50? units twice daily, Novolog 50 units twice daily.  In the hospital so far have been in the 190s. -Moderate SSI -Monitor CBGs - Will start with Levemir 10 U BID, and can clarify home medications once patient is more alert and oriented  HTN Is on HCTZ and Triameterene, Lasix and metoprolol at home. Blood pressure 120/67 on admission.  - Vitals per floor protocol - Holding home Lasix, and HCTZ-triameterene in setting of AKI - Continue metoprolol for tachycardia and monitor BP  Anemia of chronic disease: Chronic. Stable. Hgb 10.7 on admission, BL ~10. No signs of active bleeding - Monitor for signs of bleeding - CBC in am  HLD Pravastatin 40 mg taken at home. -Continue home medication  GERD Esmeprazole at home. -holding PPI in the setting of AKI  Prostatic Adenocarcinoma Per chart review, urologist is [facility]. CT abdomen/pelvis negative for any obvious pathology concerning for metastatic cancer, however prostate was not entirely visualized. This appears to not be concerning for acute process, recommend further outpatient work-up. PSA elevated at 12.94 on 5/21.  - Outpatient follow-up warranted -  continue flomax  FEN/GI: Heart healthy Prophylaxis: Lovenox  Disposition: Admit to stepdown  History of Present Illness:  [name] is a 77 y.o. male presenting with lethargy. Daughter is main historian as patient is altered and unable to answer questions. Daughter reports that he has been feeling unwell for several days.  EMS was called for him on Saturday (7/27) when other apartment resident became concerned for him, but pt refused to be taken to the hospital. This morning daughter says that he could barely talk to her on the phone so she went to check on him and found that he had worsened. He could not dress or put himself in the wheelchair. He appeared significantly weakened compared to his baseline. She reports that he was really strong and self-sufficient prior to this. Daughter called EMS to have pt brought in.  In the ED, pt was difficult to arouse but responsive on arousal.  Pt did not provide a full history but did provided a basic review of systems.  Patient was most recently hospitalized on 06/04/2017 there is time he was treated for an AKI in addition to hospital-acquired pneumonia.  He has completed his course of antibiotics since that time.  Needs another wheelchair to be functional and has not gotten prescription from doctor.   Review Of Systems: Per HPI with the following additions:   Review of Systems  Constitutional: Positive for malaise/fatigue. Negative for chills and fever.  Respiratory: Negative for shortness of breath.  Cardiovascular: Negative for chest pain.  Gastrointestinal: Negative for abdominal pain, constipation, diarrhea, nausea and vomiting.  Genitourinary: Negative for dysuria.  Neurological: Positive for weakness.    Patient Active Problem List   Diagnosis Date Noted  . Sepsis ([place]) 07/28/2018  . Pneumonia 06/04/2018  . Dehydration   . HCAP (healthcare-associated pneumonia)   . Infestation by insect   . Hypotension   . Acute on chronic combined  systolic and diastolic congestive heart failure ([place])   . Fluid overload 05/19/2018  . Elephantiasis nostra verrucosa 05/19/2018  . Pressure ulcer of upper thigh, right, stage III ([place]) 05/19/2018  . Intertrigo 05/19/2018  . Chronic venous insufficiency, Bilateral Legs 05/19/2018  . Elevated PSA, greater than or equal to 20 ng/ml 05/19/2018  . History of prostate surgery, Per Patient report, [facility] 05/19/2018  . Infestation by bed bug 05/19/2018  . Lives alone with limited help 05/19/2018  . Morbid obesity ([place]) 05/19/2018  . Dyspnea and respiratory abnormality 05/19/2018  . Systolic ejection murmur with radiation into Cartoids bilaterally 05/19/2018  . Paget's bone disease 05/19/2018  . Pulmonary hypertension ([place]) 05/19/2018  . Hypoxia   . AKI (acute kidney injury) ([place])   . Elephantiasis nostras 03/29/2016  . Impaired mobility 09/09/2014  . Diabetic peripheral neuropathy associated with type 2 diabetes mellitus ([place]) 02/07/2014  . Ulcer of left lower leg ([place]) 10/05/2012  . Anemia 05/21/2012  . Chronic leg pain 11/17/2011  . Chronic osteomyelitis of lower leg ([place]) 11/20/2010  . HLD (hyperlipidemia) 12/01/2008  . ADENOCARCINOMA, PROSTATE 05/24/2008  . DM type 2, uncontrolled, with neuropathy ([place]) 01/20/2008  . Morbid obesity with body mass index of 50.0-59.9 in adult ([place]) 03/05/2007  . Essential hypertension 03/05/2007    Past Medical History: Past Medical History:  Diagnosis Date  . COLON POLYP 02/26/2007   Qualifier: Diagnosis of  By: Erling Cruz  MD, MELISSA    . Complication of anesthesia   . Diabetes mellitus   . GERD (gastroesophageal reflux disease)   . Heart murmur    last 2D Echo -03/31/08  . HIATAL HERNIA WITH REFLUX 09/29/2006   Qualifier: Diagnosis of  By: Erling Cruz  MD, MELISSA    . HIP REPLACEMENT, BILATERAL, HX OF 05/12/2007   Qualifier: Diagnosis of  By: Erling Cruz  MD, MELISSA    . History of blood transfusion   . Hypertension   . OSTEOARTHRITIS 03/05/2007    Qualifier: Diagnosis of  By: Erling Cruz  MD, MELISSA    . PONV (postoperative nausea and vomiting)   . Prostate cancer ([place])   . SBO (small bowel obstruction) ([place]) 02/2017  . Seasonal allergies   . VENOUS INSUFFICIENCY, CHRONIC 02/26/2007   Qualifier: Diagnosis of  By: Erling Cruz  MD, MELISSA      Past Surgical History: Past Surgical History:  Procedure Laterality Date  . COLONOSCOPY    . COLONOSCOPY    . HIP ARTHROPLASTY     bil  . I&D EXTREMITY  08/14/2012   Procedure: IRRIGATION AND DEBRIDEMENT EXTREMITY;  Surgeon: Newt Minion, MD;  Location: [facility];  Service: Orthopedics;  Laterality: Right;  Irrigation and Debridement Right tibia, Placement antibiotic beads  . I&D EXTREMITY Left 03/18/2014   Procedure: IRRIGATION AND DEBRIDEMENT EXTREMITY;  Surgeon: Newt Minion, MD;  Location: [facility];  Service: Orthopedics;  Laterality: Left;  Debridement Left Calf Ulcer, Apply Theraskin and Wound VAC  . IRRIGATION AND DEBRIDEMENT ABSCESS Left 03/18/2014   DR DUDA  . LEG SURGERY     ROD for  fracture  . LEG WOUND REPAIR / CLOSURE     Beads .  Skin wound  . SKIN GRAFT     right leg- from donor skin  . SKIN GRAFT  1976   R wrist   skin graft from left thigh  . UPPER GASTROINTESTINAL ENDOSCOPY    . WISDOM TOOTH EXTRACTION    . WRIST FUSION     right wrist    Social History: Social History   Tobacco Use  . Smoking status: Never Smoker  . Smokeless tobacco: Never Used  Substance Use Topics  . Alcohol use: No  . Drug use: No   Additional social history: Pt came from his apartment where he lives by himself.  Please also refer to relevant sections of EMR.  Family History: Family History  Problem Relation Age of Onset  . Hypertension Mother      Allergies and Medications: Allergies  Allergen Reactions  . Ace Inhibitors Cough    With Lisinopril.  . Angiotensin Receptor Blockers Other (See Comments)    Caused excessive weight gain  . Metformin Diarrhea   No current facility-administered  medications on file prior to encounter.    Current Outpatient Medications on File Prior to Encounter  Medication Sig Dispense Refill  . aspirin 81 MG chewable tablet Chew 81 mg by mouth every other day.     Marland Kitchen EASY TOUCH PEN NEEDLES 31G X 8 MM MISC USE WITH FLEXPEN AS DIRECTED 100 each 7  . esomeprazole (NEXIUM) 40 MG capsule TAKE 1 CAPSULE BY MOUTH EVERY DAY 60 capsule 0  . FLOVENT HFA 220 MCG/ACT inhaler INHALE 1 PUFF INTO THE LUNGS TWICE DAILY 36 g 1  . furosemide (LASIX) 40 MG tablet Take 1 tablet (40 mg total) by mouth 2 (two) times daily. (Patient taking differently: Take 80 mg by mouth 2 (two) times daily. ) 180 tablet 3  . insulin aspart (NOVOLOG) 100 UNIT/ML injection Inject 50 Units into the skin 2 (two) times daily.     . Insulin Detemir (LEVEMIR FLEXTOUCH) 100 UNIT/ML Pen Inject 20 Units into the skin daily. (Patient taking differently: Inject 50 Units into the skin 2 (two) times daily. ) 15 mL 11  . Lancet Devices (ONE TOUCH DELICA LANCING DEV) MISC 75 Units by Does not apply route 2 (two) times daily. 1 each 0  . latanoprost (XALATAN) 0.005 % ophthalmic solution INSTILL 1 DROP IN BOTH EYES EVERY EVENING FOR GLAUCOMA 2.5 mL 2  . metoprolol tartrate (LOPRESSOR) 100 MG tablet Take 50 mg by mouth 2 (two) times daily.    . mometasone (NASONEX) 50 MCG/ACT nasal spray INSTILL 2 SPRAYS INTO THE NOSE DAILY 17 g 1  . nystatin (MYCOSTATIN/NYSTOP) powder Apply topically 2 (two) times daily. 15 g 0  . ONE TOUCH ULTRA TEST test strip USE TO CHECK BLOOD SUGAR FOUR TIMES A DAY 100 each 11  . ONE TOUCH ULTRA TEST test strip USE TO TEST BLOOD SUGAR TWICE DAILY 100 each 10  . ONETOUCH DELICA LANCETS 65L MISC USE TO CHECK BLOOD SUGAR TWICE DAILY. 200 each 9  . pravastatin (PRAVACHOL) 40 MG tablet TAKE 1 TABLET BY MOUTH DAILY 90 tablet 3  . PROAIR HFA 108 (90 Base) MCG/ACT inhaler INHALE 2 PUFFS BY MOUTH EVERY 4 HOURS AS NEEDED FOR WHEEZING OR SHORTNESS OF BREATH 8.5 each 2  . tamsulosin (FLOMAX) 0.4 MG  CAPS capsule TAKE 2 CAPSULES BY MOUTH EVERY MORNING 60 capsule 0  . traMADol (ULTRAM) 50 MG tablet TAKE 1  TABLET BY MOUTH TWICE DAILY AS NEEDED FOR PAIN 40 tablet 0  . triamterene-hydrochlorothiazide (MAXZIDE-25) 37.5-25 MG tablet Take 1 tablet by mouth daily.      Objective: BP (!) 120/55 (BP Location: Right Arm)   Pulse 96   Temp 98.2 F (36.8 C) (Oral)   Resp 16   Ht 6' (1.829 m)   Wt (!) 331 lb 12.7 oz (150.5 kg)   SpO2 100%   BMI 45.00 kg/m   Exam: Physical Exam  Constitutional: He is oriented to person, place, and time. He appears well-developed and well-nourished.  Ill appearing man, difficult to arouse. Most of his history was obtained from his daughter before more significant efforts were made to wake him.  A&Ox3.  HENT:  Nose: Nose normal.  Mouth/Throat: No oropharyngeal exudate.  Eyes: Pupils are equal, round, and reactive to light. Conjunctivae and EOM are normal.  Arcus senilis   Cardiovascular: Normal rate and regular rhythm. Exam reveals no gallop and no friction rub.  Murmur (2/6 systolic) heard. Pulmonary/Chest:  Pt saturation 100% on 2 L [place].  Pt was not responsive during the physical exam and breath sounds were assess anteriorly.  No wheezing/stridor/crackles heard anteriorly.  Abdominal: Soft.  Pt has a significant pannus under which is a large area of intertrigo with erythema and moisture in addition to   Musculoskeletal: He exhibits edema.  Significant LE edema with skin discoloration and thickening bilaterally. No evidence of erythema, skin breakdown or discharge.  Lymphadenopathy:    He has no cervical adenopathy.  Neurological: He is alert and oriented to person, place, and time. No cranial nerve deficit.  AAOx3 when awake.  Difficult to arouse.  Pt sleeps soundly.  Skin: Rash noted. There is erythema.     Labs and Imaging: CBC BMET  Recent Labs  Lab 07/28/18 1713  WBC 20.9*  HGB 10.7*  HCT 35.1*  PLT 313   Recent Labs  Lab 07/28/18 1713   NA 137  K 4.7  CL 99  CO2 22  BUN 60*  CREATININE 2.45*  GLUCOSE 204*  CALCIUM 8.8*     Dg Chest Port 1 View  Result Date: 07/28/2018 CLINICAL DATA:  Sepsis EXAM: PORTABLE CHEST 1 VIEW COMPARISON:  CT chest 06/04/2018, radiograph 06/04/2018, 05/19/2018 FINDINGS: Small left pleural effusion with lingular and left base opacity. Cardiomegaly with mild vascular congestion. No pneumothorax. IMPRESSION: 1. Cardiomegaly with vascular congestion 2. Probable small left effusion. Airspace disease at the lingula and left base may reflect atelectasis or pneumonia. Electronically Signed   By: Donavan Foil M.D.   On: 07/28/2018 16:37    Matilde Haymaker, MD 07/28/2018, 9:46 PM PGY-1, [place] Intern pager: [phone], text pages welcome  FPTS Upper-Level Resident Addendum   I have independently interviewed and examined the patient. I have discussed the above with the original author and agree with their documentation. My edits for correction/addition/clarification are in purple. Please see also any attending notes.    [place] Jedediah Noda, DO PGY-2, [facility] 07/28/2018 10:52 PM  [place] Service pager: [phone] (text pages welcome through [place])

## 2018-07-28 NOTE — Progress Notes (Signed)
Pharmacy Antibiotic Note  [name] is a 77 y.o. male admitted on 07/28/2018 with sepsis.  Pharmacy has been consulted for vancomycin and Zosyn dosing. Pt is altered, afebrile, LA 3.25  Plan: Vancomycin 2500mg  IV once then 1500mg  every 24 hours.  Goal trough 15-20 mcg/mL. Zosyn 3.375g IV q8h (4 hour infusion). Monitor clinical progression and LOT  Height: 6' (182.9 cm) Weight: (Abnormal) 310 lb (140.6 kg) IBW/kg (Calculated) : 77.6  Temp (24hrs), Avg:97.6 F (36.4 C), Min:97.4 F (36.3 C), Max:97.8 F (36.6 C)  Recent Labs  Lab 07/28/18 1433 07/28/18 1507  CREATININE 2.20*  --   LATICACIDVEN  --  3.25*    Estimated Creatinine Clearance: 40.2 mL/min (A) (by C-G formula based on SCr of 2.2 mg/dL (H)).    Allergies  Allergen Reactions  . Ace Inhibitors Cough    With Lisinopril.  . Angiotensin Receptor Blockers Other (See Comments)    Caused excessive weight gain  . Metformin Diarrhea    Antimicrobials this admission: Zosyn 7/30>> Vancomycin 7/30>>  Thank you for allowing pharmacy to be a part of this patient's care.  [name] [name] 07/28/2018 4:09 PM

## 2018-07-29 ENCOUNTER — Inpatient Hospital Stay (HOSPITAL_COMMUNITY): Payer: PPO

## 2018-07-29 ENCOUNTER — Encounter (HOSPITAL_COMMUNITY): Payer: Self-pay

## 2018-07-29 ENCOUNTER — Other Ambulatory Visit: Payer: Self-pay | Admitting: *Deleted

## 2018-07-29 DIAGNOSIS — Z7409 Other reduced mobility: Secondary | ICD-10-CM

## 2018-07-29 DIAGNOSIS — L97929 Non-pressure chronic ulcer of unspecified part of left lower leg with unspecified severity: Secondary | ICD-10-CM

## 2018-07-29 DIAGNOSIS — R460 Very low level of personal hygiene: Secondary | ICD-10-CM | POA: Diagnosis present

## 2018-07-29 DIAGNOSIS — L89153 Pressure ulcer of sacral region, stage 3: Secondary | ICD-10-CM | POA: Diagnosis present

## 2018-07-29 DIAGNOSIS — L304 Erythema intertrigo: Secondary | ICD-10-CM

## 2018-07-29 DIAGNOSIS — I519 Heart disease, unspecified: Secondary | ICD-10-CM

## 2018-07-29 DIAGNOSIS — N10 Acute tubulo-interstitial nephritis: Secondary | ICD-10-CM

## 2018-07-29 DIAGNOSIS — I9589 Other hypotension: Secondary | ICD-10-CM

## 2018-07-29 DIAGNOSIS — I700 Atherosclerosis of aorta: Secondary | ICD-10-CM

## 2018-07-29 DIAGNOSIS — Z602 Problems related to living alone: Secondary | ICD-10-CM

## 2018-07-29 DIAGNOSIS — I872 Venous insufficiency (chronic) (peripheral): Secondary | ICD-10-CM

## 2018-07-29 DIAGNOSIS — R8281 Pyuria: Secondary | ICD-10-CM | POA: Insufficient documentation

## 2018-07-29 DIAGNOSIS — I890 Lymphedema, not elsewhere classified: Secondary | ICD-10-CM

## 2018-07-29 DIAGNOSIS — N390 Urinary tract infection, site not specified: Secondary | ICD-10-CM

## 2018-07-29 DIAGNOSIS — J9602 Acute respiratory failure with hypercapnia: Secondary | ICD-10-CM | POA: Diagnosis present

## 2018-07-29 DIAGNOSIS — D638 Anemia in other chronic diseases classified elsewhere: Secondary | ICD-10-CM

## 2018-07-29 DIAGNOSIS — B372 Candidiasis of skin and nail: Secondary | ICD-10-CM

## 2018-07-29 DIAGNOSIS — J189 Pneumonia, unspecified organism: Secondary | ICD-10-CM

## 2018-07-29 DIAGNOSIS — Z6841 Body Mass Index (BMI) 40.0 and over, adult: Secondary | ICD-10-CM

## 2018-07-29 DIAGNOSIS — R710 Precipitous drop in hematocrit: Secondary | ICD-10-CM

## 2018-07-29 DIAGNOSIS — N179 Acute kidney failure, unspecified: Secondary | ICD-10-CM

## 2018-07-29 DIAGNOSIS — A419 Sepsis, unspecified organism: Principal | ICD-10-CM

## 2018-07-29 DIAGNOSIS — R400 Somnolence: Secondary | ICD-10-CM | POA: Insufficient documentation

## 2018-07-29 DIAGNOSIS — E1142 Type 2 diabetes mellitus with diabetic polyneuropathy: Secondary | ICD-10-CM

## 2018-07-29 DIAGNOSIS — E860 Dehydration: Secondary | ICD-10-CM

## 2018-07-29 DIAGNOSIS — E872 Acidosis: Secondary | ICD-10-CM

## 2018-07-29 LAB — MRSA PCR SCREENING: MRSA by PCR: NEGATIVE

## 2018-07-29 LAB — BASIC METABOLIC PANEL
Anion gap: 11 (ref 5–15)
BUN: 48 mg/dL — AB (ref 8–23)
CHLORIDE: 103 mmol/L (ref 98–111)
CO2: 27 mmol/L (ref 22–32)
Calcium: 8.2 mg/dL — ABNORMAL LOW (ref 8.9–10.3)
Creatinine, Ser: 1.54 mg/dL — ABNORMAL HIGH (ref 0.61–1.24)
GFR calc Af Amer: 48 mL/min — ABNORMAL LOW (ref >60)
GFR calc non Af Amer: 41 mL/min — ABNORMAL LOW (ref >60)
GLUCOSE: 183 mg/dL — AB (ref 70–99)
POTASSIUM: 4.1 mmol/L (ref 3.5–5.1)
Sodium: 141 mmol/L (ref 135–145)

## 2018-07-29 LAB — BLOOD GAS, ARTERIAL
ACID-BASE EXCESS: 0.7 mmol/L (ref 0.0–2.0)
BICARBONATE: 26.5 mmol/L (ref 20.0–28.0)
DRAWN BY: 347191
Delivery systems: POSITIVE
EXPIRATORY PAP: 6
Inspiratory PAP: 12
O2 Content: 2 L/min
O2 Saturation: 97.5 %
PATIENT TEMPERATURE: 98
pCO2 arterial: 55.5 mmHg — ABNORMAL HIGH (ref 32.0–48.0)
pH, Arterial: 7.299 — ABNORMAL LOW (ref 7.350–7.450)
pO2, Arterial: 107 mmHg (ref 83.0–108.0)

## 2018-07-29 LAB — CBC
HEMATOCRIT: 28.5 % — AB (ref 39.0–52.0)
Hemoglobin: 8.5 g/dL — ABNORMAL LOW (ref 13.0–17.0)
MCH: 25.4 pg — ABNORMAL LOW (ref 26.0–34.0)
MCHC: 29.8 g/dL — ABNORMAL LOW (ref 30.0–36.0)
MCV: 85.1 fL (ref 78.0–100.0)
Platelets: 273 10*3/uL (ref 150–400)
RBC: 3.35 MIL/uL — ABNORMAL LOW (ref 4.22–5.81)
RDW: 15.9 % — AB (ref 11.5–15.5)
WBC: 20.1 10*3/uL — ABNORMAL HIGH (ref 4.0–10.5)

## 2018-07-29 LAB — STREP PNEUMONIAE URINARY ANTIGEN: STREP PNEUMO URINARY ANTIGEN: NEGATIVE

## 2018-07-29 LAB — GLUCOSE, CAPILLARY
GLUCOSE-CAPILLARY: 151 mg/dL — AB (ref 70–99)
GLUCOSE-CAPILLARY: 141 mg/dL — AB (ref 70–99)
Glucose-Capillary: 140 mg/dL — ABNORMAL HIGH (ref 70–99)
Glucose-Capillary: 112 mg/dL — ABNORMAL HIGH (ref 70–99)

## 2018-07-29 LAB — BRAIN NATRIURETIC PEPTIDE: B Natriuretic Peptide: 61.6 pg/mL (ref 0.0–100.0)

## 2018-07-29 LAB — D-DIMER, QUANTITATIVE: D-Dimer, Quant: 3.86 ug/mL-FEU — ABNORMAL HIGH (ref 0.00–0.50)

## 2018-07-29 MED ORDER — TECHNETIUM TO 99M ALBUMIN AGGREGATED
4.2000 | Freq: Once | INTRAVENOUS | Status: AC | PRN
  Administered 2018-07-29: 4.2 via INTRAVENOUS

## 2018-07-29 MED ORDER — ENOXAPARIN SODIUM 80 MG/0.8ML ~~LOC~~ SOLN
75.0000 mg | SUBCUTANEOUS | Status: DC
  Administered 2018-07-29 – 2018-07-31 (×3): 75 mg via SUBCUTANEOUS
  Filled 2018-07-29 (×2): qty 0.8
  Filled 2018-07-29: qty 0.75

## 2018-07-29 MED ORDER — SODIUM CHLORIDE 0.9 % IV SOLN
1750.0000 mg | INTRAVENOUS | Status: DC
  Filled 2018-07-29: qty 1750

## 2018-07-29 MED ORDER — UREA 10 % EX LOTN
TOPICAL_LOTION | Freq: Two times a day (BID) | CUTANEOUS | Status: DC
  Administered 2018-07-29: 1 via TOPICAL
  Administered 2018-07-29: 22:00:00 via TOPICAL
  Administered 2018-07-30: 1 via TOPICAL
  Administered 2018-07-30: 09:00:00 via TOPICAL
  Administered 2018-07-31: 1 via TOPICAL
  Administered 2018-07-31: 09:00:00 via TOPICAL
  Administered 2018-08-01: 1 via TOPICAL
  Administered 2018-08-01 – 2018-08-02 (×2): via TOPICAL
  Administered 2018-08-02: 1 via TOPICAL
  Administered 2018-08-03 – 2018-08-07 (×7): via TOPICAL
  Filled 2018-07-29 (×3): qty 237

## 2018-07-29 MED ORDER — TECHNETIUM TC 99M DIETHYLENETRIAME-PENTAACETIC ACID
31.0000 | Freq: Once | INTRAVENOUS | Status: AC | PRN
  Administered 2018-07-29: 31 via INTRAVENOUS

## 2018-07-29 MED ORDER — SODIUM CHLORIDE 0.9 % IV BOLUS
500.0000 mL | Freq: Once | INTRAVENOUS | Status: AC
  Administered 2018-07-29: 500 mL via INTRAVENOUS

## 2018-07-29 MED ORDER — SODIUM CHLORIDE 0.9 % IV SOLN: INTRAVENOUS | Status: AC

## 2018-07-29 NOTE — Progress Notes (Signed)
CSW consulted for placement but per [facility] pt adamantly refusing SNF at this time.    CSW signing off- please reconsult if needed  Jorge Ny, [place] Social Worker [phone]

## 2018-07-29 NOTE — Progress Notes (Signed)
Placed pt back on BIPAP due to increase WOB. Will continue to monitor as needed.

## 2018-07-29 NOTE — Progress Notes (Signed)
Family Medicine Teaching Service Daily Progress Note Intern Pager: [phone]  Patient name: [name] Medical record number: [id number] Date of birth: 1941/07/05 Age: 77 y.o. Gender: male  Primary Care Provider: Alma Bing, DO Consultants: CSW, WOC Code Status: full  Pt Overview and Major Events to Date:  Admitted 7/30   Assessment and Plan:  [name] is a 77 y.o. male who presented with weakness and lethargy on 7/30. PMH is significant for prostate adenocarcinoma, HFpEF (EF 60-65%), anemia, BLE lymphadema, T2DM,elephantiasis nostras verrucosaRLE, impaired mobility, morbid obesity, hypertension, HLD, GERD.  Sepsis w/o shock 2/2 UTI/pneumonia/soft tissue infection  - Chest x-ray was remarkable for vascular congestion in addition to small left effusion suspicious for atelectasis/pneumonia. -UA was positive for leukocyte esterase, negative nitrites, few bacteria, yeast present.    - urine appears to have improved color and consistency today. - Vancomycin (7/30-) - Meropenem IV (7/31-) -decubitus ulcer on posterior right thigh.   -patient states improvement in discomfort  -Continue fluids at 100 mL per hour -Follow-up blood cultures, urine cultures -WBC decreased to 13.8 -Monitor vitals -Discontinued IV diflucan started in ED and started topical   SOB  Respiratory Acidosis:  New O2 requirement in ED for tachypnea. Initial ABG 7.27/57/118 with bicarb of 22 - consistent with respiratory acidosis.  -acidosis resolved -BiPAP qhs - 3L O2 nasal cannula during day.  -O2 stats arouns 100  -repeat ABG showed improved O2 to 107, pCO2 to 55.5, and pH to 7.299 - May need OSA work up as outpatient, OHS may also be component  AKI Admission labs show a creatinine of 2.2 from a baseline of 0.8 -0.9. -Light fluid hydration 100 mL's per hour, s/p 2.4L NS bolus in ED -Avoid nephrotoxic agents -Monitor creatinine -Holding home PPI and Lasix   HFpEF chronic   -Metoprolol 50 mg twice daily -Follow-up BNP -Strict I's and O's - Daily weights - Holding lasix in setting of AKI - Consider repeat ECHO for possible worsening of CHF  Decubitus ulcer posterior right thigh, previously stage III  Sacral Ulcer Per nursing note patient has significant skin breakdown and posterior thighs gluteal area with stage III ulcer present during last admission. Is a possible source of infection contributing to his current sepsis. -Wound care is managing  Mon[place] Intertrigo of abdominal pannus and intergluteal cleft -improving -Topical nystatin - s/p IV diflucan in ED x1  Chronic venous insufficiency  Elephantiasis nostras verrucosaRLE Patient has a significant history of venous insufficiency, no new findings at this time. On exam wraps had been applied to both legs.  -Maintain compression wraps - Wound care consult   Diabetes Patient has a history of type 2 diabetes with a previous A1c of 9.3 on 05/19/2018.  Home medications include Levemir 50? units twice daily, Novolog 50 units twice daily.  In the hospital so far have been in the 190s. -Moderate SSI -Monitor CBGs - Levemir 10 U BID  HTN Is on HCTZ and Triameterene, Lasix and metoprolol at home. Blood pressure 120/67 on admission.  - Vitals per floor protocol - Holding home Lasix, and HCTZ-triameterene in setting of AKI - Continue metoprolol for tachycardia and monitor BP  Anemia of chronic disease Chronic. Stable.  Hgb 10.7 on admission, 8.3 today - Monitor for signs of bleeding - CBC in am  HLD Pravastatin 40 mg taken at home. -Continue home medication  GERD Esmeprazole at home. -holding PPI in the setting of AKI  Prostatic Adenocarcinoma Per chart review, urologist is [facility]. CT  abdomen/pelvis negative for any obvious pathology concerning for metastatic cancer, however prostate was not entirely visualized. This appears to not be concerning for acute process, recommend  further outpatient work-up. PSA elevated at 12.94 on 5/21.  - Outpatient follow-up warranted - continue flomax  FEN/GI: normal diet PPx: UNNA boots  Disposition: stable  Subjective:  Patient is stable and states he's doing well today. His main concern is his skin on his leg being uncomfortable from not moving. He is alert and able to answer questions appropriately. He states he is feeling better since yesterday. His blood pressure is holding stable around 100/50 since 0300 today. His O2 stats are holding steady around 100 on 3L O2 nasal cannula and BIPAP at night. He denies difficulty breathing. He is open to getting PT/OT during hospital stay and at home but is still refusing care at another facility following discharge. PT recommended not safe to go home and needs full time care.  Objective: Vitals:   07/30/18 0800 07/30/18 0906  BP: 106/60   Pulse: 63   Resp: 18   Temp: 97.7 F (36.5 C)   SpO2: 100% 100%    Physical Exam: General: well, obese Cardiovascular: regular rate and rhythm. Holosystolic murmer 4/6 heard best at right sternal border  Respiratory: CBTA Abdomen: nontender to palpation, obese, rash 2/2 fungal infection Extremities: edema bilateral lower extremities non-pitting. Both wrapped in [place] boots. Feet have foul smell and toe nails also have fungal infection appearance. Skin is intact but peeling.  Laboratory:  07/30/2018 Glucose- 91 WBC- 13.8 RBC- 3.21 hemoglobin 8.3 Creatinine 1.02 Calcium 8.1  Imaging/Diagnostic Tests: VQ scan negative for pulmonary embolism (7/31)  CT abdomen/pelvis (7/31) 1. Gas within the urinary bladder. Question infection or recent instrumentation. 2. Normal appearance of the appendix. 3. Cholelithiasis without evidence for cholecystitis. 4. Bibasilar airspace disease and effusions. While this may represent atelectasis, infection is not excluded.  CXRAY (7/30) 1. Cardiomegaly with vascular congestion 2. Probable small left  effusion. Airspace disease at the lingula and left base may reflect atelectasis or pneumonia.  Richarda Osmond, DO PGY-1, [place] Intern pager: [phone], text pages welcome

## 2018-07-29 NOTE — Progress Notes (Signed)
Inpatient Diabetes Program Recommendations  AACE/ADA: New Consensus Statement on Inpatient Glycemic Control (2015)  Target Ranges:  Prepandial:   less than 140 mg/dL      Peak postprandial:   less than 180 mg/dL (1-2 hours)      Critically ill patients:  140 - 180 mg/dL   Lab Results  Component Value Date   GLUCAP 151 (H) 07/29/2018   HGBA1C 9.3 (H) 05/19/2018    Review of Glycemic Control Results for [name], [name] (MRN [id number]) as of 07/29/2018 10:57  Ref. Range 06/08/2018 07:50 06/08/2018 12:07 07/28/2018 20:42 07/29/2018 07:19  Glucose-Capillary Latest Ref Range: 70 - 99 mg/dL 143 (H) 195 (H) 190 (H) 151 (H)   Diabetes history: Type 2 DM Outpatient Diabetes medications: Novolog 50 units BID, Levemir 50 units BID Current orders for Inpatient glycemic control: Novolog 0-15 units TID, Levemir 10 units BID  Inpatient Diabetes Program Recommendations:    Noted last A1C was from 04/2018, consider repeating A1C?  Noted current orders, which is significantly less than home regimen. However, blood sugars at this time are trending appropriately. Will continue to watch trends and see patient when appropriate.  Thanks, Bronson Curb, MSN, RNC-OB Diabetes Coordinator [phone] (8a-5p)

## 2018-07-29 NOTE — Progress Notes (Addendum)
Dr. Pilar Plate at [phone]. Patients blood pressure is now 97/42(58). Still will arouse to name. More drowsy then initial interview upon arrival to floor. Patient is going on hour 2 of BiPAP. Second ABG resulted. Dr. Pilar Plate will consult with team on next step and notify this nurse.   Bill RN   Dr. Pilar Plate and Dr. Enid Derry to bedside 0230. 500cc bolus started.    Update post 500cc bolus:  7841: Patient is still lethargic, but aroused with minor stimulation. Pt. Obeys verbal commands and is able to identify himself, location, and current year. BP: 103/47(62). HR: 66. RR on BiPAP: 18. O2: 100%. Will update FMTS. UOP this shift: 1593mL via foley.  English as a second language teacher

## 2018-07-29 NOTE — Progress Notes (Signed)
Spoke with radiologist on call.  Explained that we would like to evaluate this patient for abdominal pain and shortness of breath.  At this time, Cr is improving, but GFR 48 currently and baseline is >60.  Radiologist said that contrast could be use, but would risk further kidney injury at this time.  CT Abdomen/Pelvis could be performed with oral contrast without risk of kidney damage.  Will order D-dimer and consider CTA following results, otherwise will plan to order CTA when GFR further improves.  Will proceed with CT abdomen/pelvis with oral contrast.  [place] Constable, D.O.  PGY-1 Family Medicine  07/29/2018 11:40 AM

## 2018-07-29 NOTE — Care Management Note (Signed)
Case Management Note  Patient Details  Name: [name] MRN: [id number] Date of Birth: 1941/08/16  Subjective/Objective:    From [facility], was previously in [facility], per pt eval rec SNF.                  Action/Plan: NCM will follow for transition of care needs.   Expected Discharge Date:  08/04/18               Expected Discharge Plan:  Skilled Nursing Facility  In-House Referral:  Clinical Social Work  Discharge planning Services  CM Consult  Post Acute Care Choice:    Choice offered to:     DME Arranged:    DME Agency:     HH Arranged:    [place] Agency:     Status of Service:  In process, will continue to follow  If discussed at Long Length of Stay Meetings, dates discussed:    Additional Comments:  Zenon Mayo, RN 07/29/2018, 2:19 PM

## 2018-07-29 NOTE — Progress Notes (Signed)
RT took pt off of BIPAP dream station and placed pt on a V60 for BIPAP after second ABG results of:  PH 7.29 CO2 56 O2 107 HCO3 26.5  (not much improvement from first ABG)   Changed pt settings to: 16/6, rate 8, 24% FIO2, SPO2 of 100.   Rt will continue to monitor pt.

## 2018-07-29 NOTE — Progress Notes (Signed)
Called to room due to patient's low BP. Patient appears to be same level of consciousness on my exam as he was previously in ED and in room. Arousalable after sternal rub (has been consistent with my exam), then able to answer questions appropriately. Denies any pain or SOB. Will give 500 cc bolus due to low BP. Will continue to watch closely, and if patient does not begin to improve will consider consult to CCM.   [place] Nevaan Bunton, DO PGY-2, [place] [facility]

## 2018-07-29 NOTE — Consult Note (Signed)
[place] Nurse wound consult note Patient evaluated in [facility] 2W23, no family present Reason for Consult: MASD to buttocks, posterior thighs Wound type:MASD with fungal involvement. The patient's buttocks, posterior upper thighs are impacted by MASD.   Plan of care:  SizeWise bed with air mattress and anti-fungal powder. Use ONLY ONE Dermatherapy pad beneath patient's buttocks.  Do NOT use disposable pads. Monitor the wound area(s) for worsening of condition such as: Signs/symptoms of infection,  Increase in size,  Development of or worsening of odor, Development of pain, or increased pain at the affected locations.  Notify the medical team if any of these develop.  Thank you for the consult.  Discussed plan of care with the patient and bedside nurse.  [place] nurse will not follow at this time.  Please re-consult the [place] team if needed.  Val Riles, RN, MSN, CWOCN, CNS-BC, pager [phone]

## 2018-07-29 NOTE — Care Management Note (Addendum)
Case Management Note  Patient Details  Name: [name] MRN: [id number] Date of Birth: Aug 07, 1941  Subjective/Objective:  From [facility], was previously in [facility], per pt eval rec SNF , patient refusing. Daughter at bedside.  Patient states he would like to go home with [name] services, he chose [facility] , but they can not provide all the services he needs.  He state [name] would be ok to work with, referral made to [place] with [name] for [facility], [place], [place], [place] and Social Work. NCM informed patient that it will be better for him to consider the SNF, but he said he does not want to go to SNF.  Will need HH orders with face to face.  Also his motorized w/chair from [place] is not working.  He states he has an aide 1 to 4 on Mon  And Wed and from 1 to 3 on Fridays.                Action/Plan: NCM will follow for transition of care needs.   Expected Discharge Date:  08/04/18               Expected Discharge Plan:  Skilled Nursing Facility  In-House Referral:  Clinical Social Work  Discharge planning Services  CM Consult  Post Acute Care Choice:  Home Health Choice offered to:  Patient  DME Arranged:    DME Agency:     HH Arranged:  RN, Disease Management, PT, OT, Nurse's Aide, Social Work, Refused SNF [place] Agency:  [place]  Status of Service:  Completed, signed off  If discussed at [employer], dates discussed:    Additional Comments:  Zenon Mayo, RN 07/29/2018, 2:40 PM

## 2018-07-29 NOTE — Progress Notes (Signed)
Pharmacy Antibiotic Note  [name] is a 77 y.o. male admitted on 07/28/2018 with sepsis.  Pharmacy has been consulted to change Zosyn to [place].  Plan: Merrem 1g IV Q8H.  Height: 6' (182.9 cm) Weight: (!) 331 lb 12.7 oz (150.5 kg) IBW/kg (Calculated) : 77.6  Temp (24hrs), Avg:97.8 F (36.6 C), Min:97.6 F (36.4 C), Max:98 F (36.7 C)  Recent Labs  Lab 07/28/18 1433 07/28/18 1507 07/28/18 1713 07/28/18 1718 07/29/18 0228  WBC  --   --  20.9*  --  20.1*  CREATININE 2.20*  --  2.45*  --  1.54*  LATICACIDVEN  --  3.25*  --  1.74  --     Estimated Creatinine Clearance: 59.7 mL/min (A) (by C-G formula based on SCr of 1.54 mg/dL (H)).    Allergies  Allergen Reactions  . Ace Inhibitors Cough    With Lisinopril.  . Angiotensin Receptor Blockers Other (See Comments)    Caused excessive weight gain  . Metformin Diarrhea    Antimicrobials this admission: Vancomycin 7/30 >  Zosyn 7/30 > 7/31 Merrem >   Microbiology results: 7/30 Blood x 2 >> MRSA PCR negative  Thank you for allowing pharmacy to be a part of this patient's care.  Wynona Neat, PharmD, BCPS  07/29/2018 11:54 PM
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
## 2018-07-29 NOTE — Progress Notes (Signed)
RT placed pt on BiPAP dream station 12/6 with 2L O2 entrained per MD request. RT will get a gas in one hour per order. RT will continue to monitor pt.

## 2018-07-29 NOTE — Progress Notes (Signed)
FPTS Interim Progress Note  BP (!) 110/46 (BP Location: Left Arm)   Pulse 83   Temp 98 F (36.7 C) (Axillary)   Resp (!) 21   Ht 6' (1.829 m)   Wt (!) 331 lb 12.7 oz (150.5 kg)   SpO2 100%   BMI 45.00 kg/m    Ddimer 3.8, not high when adjusted for age but given patient's presentation/mobility/CXR still concerned for potential PE.  Ordered VQ scan given current AKI  Sherene Sires, DO 07/29/2018, 3:14 PM PGY-2, [place] Medicine Service pager [phone]

## 2018-07-29 NOTE — Progress Notes (Addendum)
Family Medicine Teaching Service Daily Progress Note Intern Pager: [phone]  Patient name: [name] Medical record number: [id number] Date of birth: [birth year] Age: 77 y.o. Gender: male  Primary Care Provider: Moonshine Bing, DO Consultants: None Code Status: Full  Pt Overview and Major Events to Date:  7/30 Admitted  Assessment and Plan: [name] is a 77 yo male who presented with weakness and lethardy.  PMH is significant for prostate adenocarcinoma, HFpEF (EF 60-65%), anemia, BLE lymphadema, T2DM, elephantiasis nostras verrucosa RLE, impaired mobility, morbid obesity, HTN, HLD, GERD.   Sepsis w/o shock 2/2 UTI/Pneumonia/Soft Tissue Infection Patient hypotensive overnight following dose of metoprolol.  Was given 2.9L fluids overnight.  Currently on 145mL/hr. BP this AM 103/47.  -f/u blood culture -f/u urine culture -cont Vanc and Zosyn (7/30- ) -consider changing to Meropenem if no improvement this afternoon -cont to monitor vitals -hold metoprolol - f/u PT/OT/Social Work - order urine strep pneumo and legionella  SOB with Respiratory Acidosis BiPAP overnight, mildly improved Respiratory Acidosis last PM.  On BiPAP this AM, satting at 100%.  No concern for AMS.  Patient A&Ox3. -OSA workup as outpatient - will order D-Dimer - consider CTA chest, will speak with radiology about contrast recommendations in the setting of AKI   AKI: Improving Cr this AM 1.54.  S/P 2.5L bolus and currently on IVF. -cont to monitor BMP -avoid nephrotoxic agents -hold home PPI and Lasix  HFpEF: Chronic EF 60-65%.  CXR vascular congestion with small L pleural effusion.  Dry weight 316, patient weighed as 310 yesterday in ED and 331 this AM.  BNP 61.6. Patient hypotensive overnight following dose of metoprolol.  Was given 2.9L fluids overnight.  Currently on 172mL/hr. This AM patient breathing well on BiPAP.  -hold metoprolol due to hypotension -cont to monitor fluid  status  Decubitus ulcer posterior right thigh, previously stage III, Sacral Ulcer Not examined as patient unable to move and thigh ulcer was bandaged.   -wound care  Abdominal Pain Patient complaining of RLQ abdominal pain.  States "something isn't right here."  TTP on exam of RUQ and RLQ.  No history of appendectomy.  WBC 20.1.   -consider CT abdomen and pelvis, will call radiology to discuss options in the setting of AKI  Monilla Intertrigo of abdominal pannus and tnergluteal cleft Stable, no erythema, nystatin on area.  No increased moisture noted. - cont to monitor -Topical nystatin -s/p IV diflucan in ED x1 -consider oral fluconazole as needed  Chronic venous insufficiency: Elephantisis nostras verrucosa RLE: Stable -wound care -maintain compression wraps  T2DM: Chronic  Unclear home dose of levemir.  A1c 9.3 on 5/21.  CBG this AM 151. -mSSI -Levemir 10u BID -will need to clarify home dose and home usage as patient's condition improves  HTN: Chronic  On HCTZ and Triameterene, Lasix, and metoprolol at home.  BP this AM 103/47. -cont to monitor BP -holding Lasix and HCTZ-triameterene in setting of AKI -hold metoprolol  Anemia of Chronic Disease: Chronic, Stable Hgb this AM 8.5, BL~10.  Likely decreased following fluid. -continue to monitor CBC  HLD: Chronic Pravastatin 40mg  at home. -cont home meds  GERD:Chronic On esomeprazole at home. -holding in setting of AKI  Prostatic Canal [facility]. No mets seen on CT abdomen/pelvis, prostate not well-visualized. - Outpatient f/u - continue flomax    FEN/GI: Heart healthy PPx: Lovenox  Disposition: pending clinical improvement  Subjective:  Patient complaining of shortness of breath, weakness, and RLQ abdominal pain.  Objective:  Temp:  [97.4 F (36.3 C)-98.2 F (36.8 C)] 98 F (36.7 C) (07/31 0721) Pulse Rate:  [51-104] 83 (07/31 0951) Resp:  [13-22] 21 (07/31 0721) BP: (81-143)/(38-78)  110/46 (07/31 0721) SpO2:  [94 %-100 %] 100 % (07/31 0951) FiO2 (%):  [24 %-28 %] 24 % (07/31 0718) Weight:  [310 lb (140.6 kg)-331 lb 12.7 oz (150.5 kg)] 331 lb 12.7 oz (150.5 kg) (07/31 0602)  Physical Exam: General: 77 y.o. male in NAD, on BiPAP, awake, sleepy, A&Ox3 Cardio: RRR 2/6 systolic murmur Lungs: Patient unable to sit up, lung exam limited. O2% 24, IPAP 16, EPAP 6 Abdomen: Soft, tender to palpation of RUQ and RLQ, significant pannus with large area of intertrigo with erythema, no moisture Skin: warm and dry Extremities: Significant LE edema with thickening and skin discoloration b/l.  Ace wrap on legs, Left foot with verrucous appearance  Laboratory: Recent Labs  Lab 07/28/18 1433 07/28/18 1713 07/29/18 0228  WBC  --  20.9* 20.1*  HGB 11.9* 10.7* 8.5*  HCT 35.0* 35.1* 28.5*  PLT  --  313 273   Recent Labs  Lab 07/28/18 1433 07/28/18 1713 07/29/18 0228  NA 137 137 141  K 5.4* 4.7 4.1  CL 101 99 103  CO2  --  22 27  BUN 83* 60* 48*  CREATININE 2.20* 2.45* 1.54*  CALCIUM  --  8.8* 8.2*  PROT  --  8.1  --   BILITOT  --  1.5*  --   ALKPHOS  --  115  --   ALT  --  18  --   AST  --  41  --   GLUCOSE 209* 204* 183*    Imaging/Diagnostic Tests: Dg Chest Port 1 View  Result Date: 07/28/2018 CLINICAL DATA:  Sepsis EXAM: PORTABLE CHEST 1 VIEW COMPARISON:  CT chest 06/04/2018, radiograph 06/04/2018, 05/19/2018 FINDINGS: Small left pleural effusion with lingular and left base opacity. Cardiomegaly with mild vascular congestion. No pneumothorax. IMPRESSION: 1. Cardiomegaly with vascular congestion 2. Probable small left effusion. Airspace disease at the lingula and left base may reflect atelectasis or pneumonia. Electronically Signed   By: Donavan Foil M.D.   On: 07/28/2018 16:37    Reinhart Saulters, Bernita Raisin, DO 07/29/2018, 11:21 AM PGY-1, [place] Intern pager: [phone], text pages welcome

## 2018-07-29 NOTE — Patient Outreach (Signed)
[facility]) Care Management  07/29/2018  KALIEF KATTNER [birth year] [id number]  Referral via [facility] Utilization Management Dep-Crystal Calloway: High risk member ; recent high utilizer of services. Has been receiving [place] services since discharge from [facility] 6/28. Lives alone in handicapped accessible apartment.  Per chart review; Patient readmitted 7/30 -MC2W-progressive care DX: Sepsis, recurrent pneumonia, acute renal failure on chronic kidney disease.  Previous admissions: 6/10-6/28.2019, 6/6-6/09/2018, 5/21-5/31/2019.  Plan: [facility] Liaison of admission Telephonic signing off.  Sherrin Daisy, RN BSN [place] Management Coordinator [facility] Care Management  [phone]

## 2018-07-30 LAB — CBC
HEMATOCRIT: 27.8 % — AB (ref 39.0–52.0)
Hemoglobin: 8.3 g/dL — ABNORMAL LOW (ref 13.0–17.0)
MCH: 25.9 pg — AB (ref 26.0–34.0)
MCHC: 29.9 g/dL — AB (ref 30.0–36.0)
MCV: 86.6 fL (ref 78.0–100.0)
PLATELETS: 260 10*3/uL (ref 150–400)
RBC: 3.21 MIL/uL — ABNORMAL LOW (ref 4.22–5.81)
RDW: 16.1 % — AB (ref 11.5–15.5)
WBC: 13.8 10*3/uL — ABNORMAL HIGH (ref 4.0–10.5)

## 2018-07-30 LAB — BASIC METABOLIC PANEL
ANION GAP: 6 (ref 5–15)
BUN: 30 mg/dL — ABNORMAL HIGH (ref 8–23)
CALCIUM: 8.1 mg/dL — AB (ref 8.9–10.3)
CHLORIDE: 110 mmol/L (ref 98–111)
CO2: 27 mmol/L (ref 22–32)
Creatinine, Ser: 1.02 mg/dL (ref 0.61–1.24)
GFR calc non Af Amer: >60 mL/min (ref >60)
GLUCOSE: 110 mg/dL — AB (ref 70–99)
POTASSIUM: 4.1 mmol/L (ref 3.5–5.1)
Sodium: 143 mmol/L (ref 135–145)

## 2018-07-30 LAB — URINE CULTURE: Culture: >=100000 — AB

## 2018-07-30 LAB — GLUCOSE, CAPILLARY
GLUCOSE-CAPILLARY: 123 mg/dL — AB (ref 70–99)
Glucose-Capillary: 91 mg/dL (ref 70–99)
Glucose-Capillary: 123 mg/dL — ABNORMAL HIGH (ref 70–99)
Glucose-Capillary: 99 mg/dL (ref 70–99)

## 2018-07-30 MED ORDER — SODIUM CHLORIDE 0.9 % IV SOLN
1.0000 g | Freq: Three times a day (TID) | INTRAVENOUS | Status: DC
  Administered 2018-07-30 – 2018-08-02 (×9): 1 g via INTRAVENOUS
  Filled 2018-07-30 (×11): qty 1

## 2018-07-30 MED ORDER — VANCOMYCIN HCL 10 G IV SOLR
1500.0000 mg | Freq: Two times a day (BID) | INTRAVENOUS | Status: DC
  Administered 2018-07-30 – 2018-08-01 (×4): 1500 mg via INTRAVENOUS
  Filled 2018-07-30 (×6): qty 1500

## 2018-07-30 MED ORDER — GLUCERNA SHAKE PO LIQD
237.0000 mL | Freq: Two times a day (BID) | ORAL | Status: DC
  Administered 2018-07-30 – 2018-07-31 (×2): 237 mL via ORAL

## 2018-07-30 MED ORDER — FLUCONAZOLE 100 MG PO TABS
800.0000 mg | ORAL_TABLET | Freq: Once | ORAL | Status: AC
  Administered 2018-07-30: 800 mg via ORAL
  Filled 2018-07-30: qty 8

## 2018-07-30 MED ORDER — FLUCONAZOLE 200 MG PO TABS
400.0000 mg | ORAL_TABLET | Freq: Every day | ORAL | Status: DC
  Administered 2018-07-31: 400 mg via ORAL
  Filled 2018-07-30: qty 2
  Filled 2018-07-30: qty 4

## 2018-07-30 MED ORDER — INSULIN DETEMIR 100 UNIT/ML ~~LOC~~ SOLN
5.0000 [IU] | Freq: Two times a day (BID) | SUBCUTANEOUS | Status: DC
  Administered 2018-07-30 – 2018-07-31 (×3): 5 [IU] via SUBCUTANEOUS
  Filled 2018-07-30 (×4): qty 0.05

## 2018-07-30 MED ORDER — SODIUM CHLORIDE 0.9 % IV SOLN
INTRAVENOUS | Status: DC
  Administered 2018-07-30: via INTRAVENOUS

## 2018-07-30 NOTE — Evaluation (Signed)
Occupational Therapy Evaluation Patient Details Name: [name] MRN: [id number] DOB: 1941/09/06 Today's Date: 07/30/2018    History of Present Illness 77 yo male who presented with weakness and lethargy.  PMH is significant for prostate adenocarcinoma, HFpEF (EF 60-65%), anemia, BLE lymphadema, T2DM, elephantiasis nostras verrucosa RLE, impaired mobility, morbid obesity, HTN, HLD, GERD. Admitted with sepsis, respiratory acidosis and AKI.    Clinical Impression   Pt with significant decline in function and safety with ADLs and ADL mobility with decreased strength, balance and endurance. Pt requires extensive assist with ADLs and +2-3 assist with bed mobility. Pt with very unrealistic ideas about going home after acute d/c vs to SNF for ST rehab. Pt would benefit from acute OT services to address impairments to maximize level of function and safety    Follow Up Recommendations  SNF    Equipment Recommendations  Other (comment)(TBD at next venue of care)    Recommendations for Other Services       Precautions / Restrictions Precautions Precautions: Fall Restrictions Weight Bearing Restrictions: No      Mobility Bed Mobility Overal bed mobility: Needs Assistance Bed Mobility: Rolling Rolling: Max assist;+2 for physical assistance;+2 for safety/equipment         General bed mobility comments: unable to sit EOB  Transfers                 General transfer comment: NT    Balance                                           ADL either performed or assessed with clinical judgement   ADL Overall ADL's : Needs assistance/impaired     Grooming: Wash/dry hands;Wash/dry face;Bed level;Supervision/safety;Set up   Upper Body Bathing: Minimal assistance;Bed level   Lower Body Bathing: Bed level;Total assistance   Upper Body Dressing : Minimal assistance;Bed level   Lower Body Dressing: Total assistance   Toilet Transfer: (NT) Toilet Transfer  Details (indicate cue type and reason): would need +2 total A Toileting- Clothing Manipulation and Hygiene: Total assistance               Vision Baseline Vision/History: Wears glasses Wears Glasses: At all times Patient Visual Report: No change from baseline       Perception     Praxis      Pertinent Vitals/Pain Pain Assessment: Faces Faces Pain Scale: Hurts a little bit Pain Location: stomach Pain Descriptors / Indicators: Grimacing;Guarding Pain Intervention(s): Limited activity within patient's tolerance;Monitored during session     Hand Dominance Right   Extremity/Trunk Assessment Upper Extremity Assessment Upper Extremity Assessment: Generalized weakness   Lower Extremity Assessment Lower Extremity Assessment: Defer to PT evaluation       Communication Communication Communication: No difficulties   Cognition Arousal/Alertness: Awake/alert Behavior During Therapy: WFL for tasks assessed/performed Overall Cognitive Status: Within Functional Limits for tasks assessed Area of Impairment: Safety/judgement                         Safety/Judgement: Decreased awareness of safety;Decreased awareness of deficits     General Comments: adament about going home despite mobility issues   General Comments       Exercises     Shoulder Instructions      Home Living Family/patient expects to be discharged to:: Private residence Living Arrangements: Alone Available Help at  Discharge: Family;Friend(s);Available PRN/intermittently Type of Home: Apartment Home Access: Elevator     Home Layout: One level     Bathroom Shower/Tub: Teacher, early years/pre: Handicapped height     Home Equipment: Wheelchair - power;Crutches;Adaptive equipment;Hospital bed;Tub bench Adaptive Equipment: Reacher Additional Comments: reports that his w/c remains broken and use of w/c has resulted in wounds on the backs of his legs       Prior  Functioning/Environment Level of Independence: Independent with assistive device(s)        Comments: transfers independently to motorized w/c, sponge bathes and dresses independently using reacher, friends get groceries, uses [employer] to get to grocery store        OT Problem List: Decreased activity tolerance;Impaired balance (sitting and/or standing);Decreased knowledge of use of DME or AE;Pain;Obesity;Cardiopulmonary status limiting activity      OT Treatment/Interventions: Self-care/ADL training;DME and/or AE instruction;Balance training;Patient/family education;Therapeutic activities    OT Goals(Current goals can be found in the care plan section) Acute Rehab OT Goals Patient Stated Goal: to return home OT Goal Formulation: With patient Time For Goal Achievement: 08/13/18 Potential to Achieve Goals: Fair ADL Goals Pt Will Perform Grooming: with min guard assist;with supervision;with set-up;sitting Pt Will Perform Upper Body Bathing: with min guard assist;with supervision;with set-up;sitting Pt Will Perform Lower Body Bathing: with max assist;with mod assist;sitting/lateral leans Pt Will Perform Upper Body Dressing: with min guard assist;with supervision;with set-up;sitting Pt Will Transfer to Toilet: with max assist;with +2 assist;squat pivot transfer;stand pivot transfer;bedside commode  OT Frequency: Min 2X/week   Barriers to D/C:            Co-evaluation              AM-PAC PT "6 Clicks" Daily Activity     Outcome Measure Help from another person eating meals?: None Help from another person taking care of personal grooming?: A Little Help from another person toileting, which includes using toliet, bedpan, or urinal?: Total Help from another person bathing (including washing, rinsing, drying)?: Total Help from another person to put on and taking off regular upper body clothing?: A Little Help from another person to put on and taking off regular lower body clothing?:  Total 6 Click Score: 13   End of Session Nurse Communication: Mobility status;Other (comment)(pt unrealistic insisting on returning home)  Activity Tolerance: Patient limited by fatigue Patient left: in bed;with call bell/phone within reach  OT Visit Diagnosis: Unsteadiness on feet (R26.81);Other abnormalities of gait and mobility (R26.89);Muscle weakness (generalized) (M62.81);Pain Pain - part of body: (stomach)                Time: 2841-3244 OT Time Calculation (min): 20 min Charges:  OT General Charges $OT Visit: 1 Visit OT Evaluation $OT Eval Moderate Complexity: 1 Mod    Britt Bottom 07/30/2018, 12:17 PM

## 2018-07-30 NOTE — Consult Note (Addendum)
   [facility] CM Inpatient Consult   07/30/2018  JYAIR KIRALY [birth year] [id number]    Spoke with Mr. Brach at bedside along with inpatient RNCM.  Mr. Norem continues to adamantly refuse any type of placement. Inquired about long term placement and whether he has Medicaid. Mr. Gongaware reports he does not have Medicaid and is opposed to considering long term placement even if he had long term Medicaid.  Mr. Hires states he has aide assistance for a total of 8 hrs a week 3 hr aide assistance from 1-4 on Mondays and Wednesdays and from 1-3 on Fridays.   He states he has "friends" in his apartment complex that assist with food. However, he does not have anyone who can stay with him 24/7.   Discussed [facility] Care Management follow up. He is agreeable and [facility] active consent remains on file. Will await to make appropriate [place] referrals once disposition plans are clear. Right now, Mr. Bruington does not have a safe discharge plan.  Discussed with inpatient RNCM that Mr. Boeckman could benefit from a palliative care consult/ goals of care discussion as well due to frequent hospitalizations and co-morbities.  Will continue to follow.   Marthenia Rolling, MSN-Ed, RN,BSN [facility] Liaison [phone]

## 2018-07-30 NOTE — Evaluation (Signed)
Clinical/Bedside Swallow Evaluation Patient Details  Name: [name] MRN: [id number] Date of Birth: May 06, 1941  Today's Date: 07/30/2018 Time: SLP Start Time (ACUTE ONLY): 1435 SLP Stop Time (ACUTE ONLY): 1448 SLP Time Calculation (min) (ACUTE ONLY): 13 min  Past Medical History:  Past Medical History:  Diagnosis Date  . Chronic osteomyelitis of lower leg (HCC) 11/20/2010   Chronic osteoarthritis of right lower leg-10/03/10 note by Dr. Harvie Heck scanned of this visit under media tab. Dr. Sharol Given states that this is chronic osteomyelitis, only cure would be amputation. Patient was not agreeable to this at his visit with Dr. Sharol Given. Patient elected to apply Bactroban cream, Dr. Sharol Given and time of that visit also prescribe doxycycline. Dr. Sharol Given stay the patient she come to the [facility] 02/26/2007   Qualifier: Diagnosis of  By: Erling Cruz  MD, Silas    . Complication of anesthesia   . Diabetes mellitus   . Diabetic peripheral neuropathy associated with type 2 diabetes mellitus ([place]) 02/07/2014  . GERD (gastroesophageal reflux disease)   . Grade II diastolic dysfunction   . Heart murmur    last 2D Echo -03/31/08  . HIATAL HERNIA WITH REFLUX 09/29/2006   Qualifier: Diagnosis of  By: Erling Cruz  MD, MELISSA    . HIP REPLACEMENT, BILATERAL, HX OF 05/12/2007   Qualifier: Diagnosis of  By: Erling Cruz  MD, MELISSA    . History of blood transfusion   . Hypertension   . OSTEOARTHRITIS 03/05/2007   Qualifier: Diagnosis of  By: Erling Cruz  MD, MELISSA    . PONV (postoperative nausea and vomiting)   . Prostate cancer ([place])   . SBO (small bowel obstruction) ([place]) 02/2017  . Seasonal allergies   . VENOUS INSUFFICIENCY, CHRONIC 02/26/2007   Qualifier: Diagnosis of  By: Erling Cruz  MD, MELISSA     Past Surgical History:  Past Surgical History:  Procedure Laterality Date  . COLONOSCOPY    . COLONOSCOPY    . HIP ARTHROPLASTY     bil  . I&D EXTREMITY  08/14/2012   Procedure: IRRIGATION AND DEBRIDEMENT EXTREMITY;  Surgeon: Newt Minion, MD;  Location: [place];  Service: Orthopedics;  Laterality: Right;  Irrigation and Debridement Right tibia, Placement antibiotic beads  . I&D EXTREMITY Left 03/18/2014   Procedure: IRRIGATION AND DEBRIDEMENT EXTREMITY;  Surgeon: Newt Minion, MD;  Location: [place];  Service: Orthopedics;  Laterality: Left;  Debridement Left Calf Ulcer, Apply Theraskin and Wound VAC  . IRRIGATION AND DEBRIDEMENT ABSCESS Left 03/18/2014   DR DUDA  . LEG SURGERY     ROD for fracture  . LEG WOUND REPAIR / CLOSURE     Beads .  Skin wound  . SKIN GRAFT     right leg- from donor skin  . SKIN GRAFT  1976   R wrist   skin graft from left thigh  . UPPER GASTROINTESTINAL ENDOSCOPY    . WISDOM TOOTH EXTRACTION    . WRIST FUSION     right wrist   HPI:  77 yo male who presented with weakness and lethargy.  PMH is significant for prostate adenocarcinoma, HFpEF (EF 60-65%), anemia, BLE lymphadema, T2DM, elephantiasis nostras verrucosa RLE, impaired mobility, morbid obesity, HTN, HLD, GERD. Admitted with sepsis, respiratory acidosis and AKI.    Assessment / Plan / Recommendation Clinical Impression  Pt presents with no overt s/s of dysphagia or aspiration when consuming regular diet textures and thin liquids via straw. Pt consumed regular graham cracker with thin water  via straw with no change in WOB, O2 sats or vitals. No indication at bedside that current diet is not appropriate. SLP spoke with nurse who supports current diet and hasn't observed any overt s/s of aspiration. ST to sign off at this time.  SLP Visit Diagnosis: Dysphagia, unspecified (R13.10)    Aspiration Risk  Mild aspiration risk;No limitations    Diet Recommendation Regular;Thin liquid   Liquid Administration via: Straw;Cup Medication Administration: Whole meds with liquid Supervision: Patient able to self feed Compensations: Minimize environmental distractions;Slow rate;Small sips/bites Postural Changes: Seated upright at 90 degrees     Other  Recommendations Oral Care Recommendations: Oral care BID   Follow up Recommendations None      Frequency and Duration            Prognosis        Swallow Study   General Date of Onset: 07/28/18 HPI: 77 yo male who presented with weakness and lethargy.  PMH is significant for prostate adenocarcinoma, HFpEF (EF 60-65%), anemia, BLE lymphadema, T2DM, elephantiasis nostras verrucosa RLE, impaired mobility, morbid obesity, HTN, HLD, GERD. Admitted with sepsis, respiratory acidosis and AKI.  Type of Study: Bedside Swallow Evaluation Previous Swallow Assessment: none in chart Diet Prior to this Study: Regular;Thin liquids Temperature Spikes Noted: No Respiratory Status: Nasal cannula(2.5 liters) History of Recent Intubation: No Behavior/Cognition: Alert;Cooperative;Pleasant mood Oral Cavity Assessment: Within Functional Limits Oral Care Completed by SLP: No Oral Cavity - Dentition: Adequate natural dentition Vision: Functional for self-feeding Self-Feeding Abilities: Able to feed self Patient Positioning: Upright in bed Baseline Vocal Quality: Normal;Low vocal intensity Volitional Cough: Strong Volitional Swallow: Able to elicit    Oral/Motor/Sensory Function Overall Oral Motor/Sensory Function: Within functional limits   Ice Chips Ice chips: Not tested   Thin Liquid Thin Liquid: Within functional limits Presentation: Self Fed;Straw    Nectar Thick Nectar Thick Liquid: Not tested   Honey Thick Honey Thick Liquid: Not tested   Puree Puree: Not tested   Solid     Solid: Within functional limits Presentation: Self Fed      Bethenny Losee 07/30/2018,2:50 PM

## 2018-07-30 NOTE — Progress Notes (Signed)
Patient with continued hypotension. Will continue IVF at 126mL/hr, and will broaden coverage for ESBL with meropenem. Patient still mentating appropriately when aroused. Will continue to monitor closely.   [place] Renn Dirocco, DO PGY-2, [place] [facility]

## 2018-07-30 NOTE — Progress Notes (Signed)
FPTS Interim Progress Note  S:update   O: BP (!) 107/55 (BP Location: Left Arm)   Pulse 88   Temp 97.7 F (36.5 C) (Oral)   Resp 19   Ht 6' (1.829 m)   Wt (!) 331 lb 12.7 oz (150.5 kg)   SpO2 100%   BMI 45.00 kg/m     A/P:  Sepsis 2/2 infection of possible causes -pneumonia- possible: breath sounds clear bilateral, CXRAY showed signs of atelectasis or pneumonia, nurse reports increased work of breathing. -UTI- positive for yeast on culture. Foley was D/C and patient was given urinal -stage 3 sacral ulcer: managed by wound care and patient states improving condition  SOB -O2 stats were 100% on 3L, and planned to wean as tolerated -O2 decreased to 2.5L and patient still stat at 100% -nurse reports the patient O2 stats drop to low 80s if the patient is reclined back. Patient states he had no difficulty breathing except when they were rolling him around to change his bedding -  Diposition -patient has worsening health and cannot live safely alone but refuses admittance to a facility. -consult CSW again to get wheelchair -consult palliative care -  Diabetes -blood sugars in the 90s/100s -discontinue levemir 5 BID  HTN -discontinue metoprolol -watch for angina -re listen to heart for [place]    Richarda Osmond, DO 07/30/2018, 1:38 PM PGY-1, [place] Medicine Service pager [phone]

## 2018-07-30 NOTE — Progress Notes (Signed)
The patient was unavailable for nebulizer treatment this morning. X3. Will attempt to revisit patient.

## 2018-07-30 NOTE — Progress Notes (Signed)
CSW met with pt to try and rediscuss possibility of SNF.  Pt adamantly against going to SNF at DC- had negative experience at [facility] after previous admission and is not agreeable to returning to that SNF or an alternative option.  Pt states he has aid 8hr/wk, 3day/wk- CSW inquired how could he care for himself while they are not there and he could not give specific answers- keeps bringing up that he was able to care for himself prior to Friday  Pt not agreeable to CSW faxing out to SNF even as a back up option  CSW signing off at this time- please reconsult if needed  Jorge Ny, [place] Social Worker [phone]

## 2018-07-30 NOTE — Progress Notes (Signed)
Pharmacy Antibiotic Note  [name] is a 77 y.o. male admitted on 07/28/2018 with sepsis.  Pharmacy has been consulted to change Zosyn to [place].  Scr much improved  Plan: Meropenem 1g IV Q8H. Increase Vancomycin to 1500 mg iv Q 12  Height: 6' (182.9 cm) Weight: (!) 331 lb 12.7 oz (150.5 kg) IBW/kg (Calculated) : 77.6  Temp (24hrs), Avg:97.7 F (36.5 C), Min:97.6 F (36.4 C), Max:97.7 F (36.5 C)  Recent Labs  Lab 07/28/18 1433 07/28/18 1507 07/28/18 1713 07/28/18 1718 07/29/18 0228 07/30/18 0233  WBC  --   --  20.9*  --  20.1* 13.8*  CREATININE 2.20*  --  2.45*  --  1.54* 1.02  LATICACIDVEN  --  3.25*  --  1.74  --   --     Estimated Creatinine Clearance: 90.2 mL/min (by C-G formula based on SCr of 1.02 mg/dL).    Allergies  Allergen Reactions  . Ace Inhibitors Cough    With Lisinopril.  . Angiotensin Receptor Blockers Other (See Comments)    Caused excessive weight gain  . Metformin Diarrhea    Antimicrobials this admission: Vancomycin 7/30 >  Zosyn 7/30 > 7/31 Merrem >   Microbiology results: 7/30 Blood x 2 >> MRSA PCR negative  Thank you for allowing pharmacy to be a part of this patient's care. Anette Guarneri, PharmD [phone]  07/30/2018 9:16 AM

## 2018-07-30 NOTE — Progress Notes (Addendum)
Initial Nutrition Assessment  DOCUMENTATION CODES:   Morbid obesity  INTERVENTION:    Glucerna Shake po BID, each supplement provides 220 kcal and 10 grams of protein  NUTRITION DIAGNOSIS:   Increased nutrient needs related to chronic illness, wound healing as evidenced by estimated needs  GOAL:   Patient will meet greater than or equal to 90% of their needs  MONITOR:   PO intake, Supplement acceptance, Labs, Skin, Weight trends, I & O's  REASON FOR ASSESSMENT:   Low Braden  ASSESSMENT:   77 y.o. Male who presented with weakness and lethargy on 7/30. PMH is significant for prostate adenocarcinoma, HFpEF (EF 60-65%), anemia, BLE lymphadema, T2DM, elephantiasis nostras verrucosa RLE, impaired mobility, morbid obesity, hypertension, HLD, GERD.  RD briefly spoke with pt at bedside. He is a poor historian.  RN reports pt only consumed his yogurt and juice for breakfast. Pt stated he does drink oral nutrition supplements at home.  Low braden score places pt at risk for further skin breakdown. SLP consulted for possible recurrent aspiration pneumonia. Labs and medications reviewed. CBG's [id number].  Of note patient is wheelchair bound.  Care management consulted for new bariatric wheelchair.  NUTRITION - FOCUSED PHYSICAL EXAM:  Unable to complete at this time.  Diet Order:   Diet Order           Diet heart healthy/carb modified Room service appropriate? Yes; Fluid consistency: Thin  Diet effective now         EDUCATION NEEDS:   No education needs have been identified at this time  Skin:  Skin Assessment: Skin Integrity Issues: Skin Integrity Issues:: Other (Comment) Other: MASD to thigh & sacrum  Last BM:  7/31  Height:   Ht Readings from Last 1 Encounters:  07/28/18 6' (1.829 m)   Weight:   Wt Readings from Last 1 Encounters:  07/30/18 (!) 331 lb 12.7 oz (150.5 kg)   BMI:  Body mass index is 45 kg/m.  Estimated Nutritional Needs:   Kcal:   1800-2000  Protein:  100-115 gm  Fluid:  1.8-2.0 L/day  [name], RD, LDN Pager #: [phone] After-Hours Pager #: [phone]

## 2018-07-31 ENCOUNTER — Inpatient Hospital Stay (HOSPITAL_COMMUNITY): Payer: PPO | Admitting: Certified Registered Nurse Anesthetist

## 2018-07-31 ENCOUNTER — Inpatient Hospital Stay (HOSPITAL_COMMUNITY): Payer: PPO

## 2018-07-31 DIAGNOSIS — I469 Cardiac arrest, cause unspecified: Secondary | ICD-10-CM

## 2018-07-31 DIAGNOSIS — Z515 Encounter for palliative care: Secondary | ICD-10-CM

## 2018-07-31 LAB — CBC
HCT: 30.5 % — ABNORMAL LOW (ref 39.0–52.0)
HCT: 31.2 % — ABNORMAL LOW (ref 39.0–52.0)
Hemoglobin: 8.9 g/dL — ABNORMAL LOW (ref 13.0–17.0)
Hemoglobin: 9 g/dL — ABNORMAL LOW (ref 13.0–17.0)
MCH: 25.7 pg — ABNORMAL LOW (ref 26.0–34.0)
MCH: 25.6 pg — AB (ref 26.0–34.0)
MCHC: 29.2 g/dL — ABNORMAL LOW (ref 30.0–36.0)
MCHC: 28.8 g/dL — AB (ref 30.0–36.0)
MCV: 88.2 fL (ref 78.0–100.0)
MCV: 88.9 fL (ref 78.0–100.0)
PLATELETS: 294 10*3/uL (ref 150–400)
Platelets: 178 10*3/uL (ref 150–400)
RBC: 3.46 MIL/uL — ABNORMAL LOW (ref 4.22–5.81)
RBC: 3.51 MIL/uL — ABNORMAL LOW (ref 4.22–5.81)
RDW: 16.4 % — AB (ref 11.5–15.5)
RDW: 16.3 % — ABNORMAL HIGH (ref 11.5–15.5)
WBC: 11.7 10*3/uL — ABNORMAL HIGH (ref 4.0–10.5)
WBC: 11 10*3/uL — ABNORMAL HIGH (ref 4.0–10.5)

## 2018-07-31 LAB — LACTIC ACID, PLASMA: LACTIC ACID, VENOUS: 1.2 mmol/L (ref 0.5–1.9)

## 2018-07-31 LAB — GLUCOSE, CAPILLARY
Glucose-Capillary: 73 mg/dL (ref 70–99)
Glucose-Capillary: 156 mg/dL — ABNORMAL HIGH (ref 70–99)
Glucose-Capillary: 148 mg/dL — ABNORMAL HIGH (ref 70–99)

## 2018-07-31 LAB — BLOOD GAS, ARTERIAL
ACID-BASE DEFICIT: 0.3 mmol/L (ref 0.0–2.0)
Acid-Base Excess: 2.2 mmol/L — ABNORMAL HIGH (ref 0.0–2.0)
BICARBONATE: 27 mmol/L (ref 20.0–28.0)
Bicarbonate: 27.9 mmol/L (ref 20.0–28.0)
DRAWN BY: 44137
Drawn by: 23703
FIO2: 100
FIO2: 100
O2 SAT: 96.5 %
O2 Saturation: 99.6 %
PATIENT TEMPERATURE: 98.6
PATIENT TEMPERATURE: 97.9
PCO2 ART: 57.2 mmHg — AB (ref 32.0–48.0)
PEEP: 5 cmH2O
PH ART: 7.308 — AB (ref 7.350–7.450)
RATE: 18 resp/min
VT: 620 mL
pCO2 arterial: 73.4 mmHg (ref 32.0–48.0)
pH, Arterial: 7.191 — CL (ref 7.350–7.450)
pO2, Arterial: 103 mmHg (ref 83.0–108.0)
pO2, Arterial: 485 mmHg — ABNORMAL HIGH (ref 83.0–108.0)

## 2018-07-31 LAB — POCT I-STAT 3, ART BLOOD GAS (G3+)
ACID-BASE EXCESS: 3 mmol/L — AB (ref 0.0–2.0)
ACID-BASE EXCESS: 3 mmol/L — AB (ref 0.0–2.0)
Bicarbonate: 30.7 mmol/L — ABNORMAL HIGH (ref 20.0–28.0)
Bicarbonate: 31.1 mmol/L — ABNORMAL HIGH (ref 20.0–28.0)
O2 Saturation: 99 %
O2 Saturation: 99 %
PH ART: 7.282 — AB (ref 7.350–7.450)
PO2 ART: 154 mmHg — AB (ref 83.0–108.0)
PO2 ART: 156 mmHg — AB (ref 83.0–108.0)
Patient temperature: 97.7
Patient temperature: 98.9
TCO2: 33 mmol/L — ABNORMAL HIGH (ref 22–32)
TCO2: 33 mmol/L — ABNORMAL HIGH (ref 22–32)
pCO2 arterial: 65.1 mmHg (ref 32.0–48.0)
pCO2 arterial: 65.9 mmHg (ref 32.0–48.0)
pH, Arterial: 7.279 — ABNORMAL LOW (ref 7.350–7.450)

## 2018-07-31 LAB — BASIC METABOLIC PANEL
Anion gap: 6 (ref 5–15)
Anion gap: 15 (ref 5–15)
BUN: 20 mg/dL (ref 8–23)
BUN: 13 mg/dL (ref 8–23)
CALCIUM: 8.5 mg/dL — AB (ref 8.9–10.3)
CHLORIDE: 110 mmol/L (ref 98–111)
CO2: 29 mmol/L (ref 22–32)
CO2: 23 mmol/L (ref 22–32)
CREATININE: 0.74 mg/dL (ref 0.61–1.24)
Calcium: 8.4 mg/dL — ABNORMAL LOW (ref 8.9–10.3)
Chloride: 105 mmol/L (ref 98–111)
Creatinine, Ser: 0.77 mg/dL (ref 0.61–1.24)
GFR calc Af Amer: >60 mL/min (ref >60)
GFR calc Af Amer: >60 mL/min (ref >60)
GFR calc non Af Amer: >60 mL/min (ref >60)
GLUCOSE: 151 mg/dL — AB (ref 70–99)
Glucose, Bld: 80 mg/dL (ref 70–99)
Potassium: 4.1 mmol/L (ref 3.5–5.1)
Potassium: 4.2 mmol/L (ref 3.5–5.1)
Sodium: 145 mmol/L (ref 135–145)
Sodium: 143 mmol/L (ref 135–145)

## 2018-07-31 LAB — LEGIONELLA PNEUMOPHILA SEROGP 1 UR AG: L. PNEUMOPHILA SEROGP 1 UR AG: NEGATIVE

## 2018-07-31 LAB — TROPONIN I: Troponin I: 0.03 ng/mL (ref <0.03)

## 2018-07-31 MED ORDER — FENTANYL CITRATE (PF) 100 MCG/2ML IJ SOLN: 50.0000 ug | INTRAMUSCULAR | Status: DC | PRN

## 2018-07-31 MED ORDER — FENTANYL CITRATE (PF) 100 MCG/2ML IJ SOLN
100.0000 ug | Freq: Once | INTRAMUSCULAR | Status: AC
  Administered 2018-07-31: 100 ug via INTRAVENOUS

## 2018-07-31 MED ORDER — MIDAZOLAM HCL 2 MG/2ML IJ SOLN
1.0000 mg | INTRAMUSCULAR | Status: DC | PRN
  Administered 2018-07-31: 1 mg via INTRAVENOUS
  Filled 2018-07-31 (×2): qty 2

## 2018-07-31 MED ORDER — DEXMEDETOMIDINE HCL IN NACL 400 MCG/100ML IV SOLN
0.0000 ug/kg/h | INTRAVENOUS | Status: DC
  Administered 2018-07-31: 1 ug/kg/h via INTRAVENOUS
  Filled 2018-07-31 (×2): qty 100

## 2018-07-31 MED ORDER — FENTANYL CITRATE (PF) 100 MCG/2ML IJ SOLN
INTRAMUSCULAR | Status: AC
  Administered 2018-07-31: 100 ug via INTRAVENOUS
  Filled 2018-07-31: qty 2

## 2018-07-31 MED ORDER — SODIUM CHLORIDE 0.9 % IV SOLN
INTRAVENOUS | Status: DC
  Administered 2018-07-31: 13:00:00 via INTRAVENOUS

## 2018-07-31 MED ORDER — GERHARDT'S BUTT CREAM
TOPICAL_CREAM | Freq: Three times a day (TID) | CUTANEOUS | Status: DC
  Administered 2018-07-31: 17:00:00 via TOPICAL
  Administered 2018-08-01: 1 via TOPICAL
  Administered 2018-08-01 (×2): via TOPICAL
  Administered 2018-08-01 – 2018-08-02 (×2): 1 via TOPICAL
  Administered 2018-08-02 – 2018-08-07 (×16): via TOPICAL
  Filled 2018-07-31: qty 1

## 2018-07-31 MED ORDER — FENTANYL CITRATE (PF) 100 MCG/2ML IJ SOLN
INTRAMUSCULAR | Status: AC
  Administered 2018-07-31: 50 ug via INTRAVENOUS
  Filled 2018-07-31: qty 2

## 2018-07-31 MED ORDER — ORAL CARE MOUTH RINSE
15.0000 mL | Freq: Two times a day (BID) | OROMUCOSAL | Status: DC
  Administered 2018-07-31 – 2018-08-07 (×13): 15 mL via OROMUCOSAL

## 2018-07-31 MED ORDER — DOCUSATE SODIUM 50 MG/5ML PO LIQD: 100.0000 mg | Freq: Two times a day (BID) | ORAL | Status: DC | PRN

## 2018-07-31 MED ORDER — FENTANYL CITRATE (PF) 100 MCG/2ML IJ SOLN
50.0000 ug | INTRAMUSCULAR | Status: DC | PRN
  Administered 2018-07-31: 50 ug via INTRAVENOUS
  Filled 2018-07-31 (×2): qty 2

## 2018-07-31 MED ORDER — MIDAZOLAM HCL 2 MG/2ML IJ SOLN
INTRAMUSCULAR | Status: AC
  Administered 2018-07-31: 2 mg via INTRAVENOUS
  Filled 2018-07-31: qty 2

## 2018-07-31 MED ORDER — CHLORHEXIDINE GLUCONATE 0.12 % MT SOLN
15.0000 mL | Freq: Two times a day (BID) | OROMUCOSAL | Status: DC
  Administered 2018-07-31 – 2018-08-07 (×12): 15 mL via OROMUCOSAL
  Filled 2018-07-31 (×10): qty 15

## 2018-07-31 MED ORDER — FENTANYL CITRATE (PF) 100 MCG/2ML IJ SOLN
50.0000 ug | Freq: Once | INTRAMUSCULAR | Status: AC
  Administered 2018-07-31: 50 ug via INTRAVENOUS

## 2018-07-31 MED ORDER — MIDAZOLAM HCL 2 MG/2ML IJ SOLN: 1.0000 mg | INTRAMUSCULAR | Status: DC | PRN

## 2018-07-31 MED ORDER — FAMOTIDINE IN NACL 20-0.9 MG/50ML-% IV SOLN
20.0000 mg | Freq: Two times a day (BID) | INTRAVENOUS | Status: DC
  Administered 2018-07-31 (×2): 20 mg via INTRAVENOUS
  Filled 2018-07-31 (×3): qty 50

## 2018-07-31 MED ORDER — FENTANYL CITRATE (PF) 100 MCG/2ML IJ SOLN: 12.5000 ug | INTRAMUSCULAR | Status: DC | PRN

## 2018-07-31 MED ORDER — MIDAZOLAM HCL 2 MG/2ML IJ SOLN
2.0000 mg | Freq: Once | INTRAMUSCULAR | Status: AC
  Administered 2018-07-31: 2 mg via INTRAVENOUS

## 2018-07-31 MED ORDER — IOPAMIDOL (ISOVUE-370) INJECTION 76%
INTRAVENOUS | Status: AC
  Administered 2018-07-31: 100 mL
  Filled 2018-07-31: qty 100

## 2018-07-31 MED FILL — Medication: Qty: 1 | Status: AC

## 2018-07-31 NOTE — Progress Notes (Signed)
Orthopedic Tech Progress Note Patient Details:  [name] [birth year] [id number]  Patient ID: [name], male   DOB: [birth year], 77 y.o.   MRN: [id number]   [name] 07/31/2018, 5:24 PMOut of unna boot wrap

## 2018-07-31 NOTE — Progress Notes (Signed)
RT obtained ABG and reported critical values to RN.

## 2018-07-31 NOTE — Consult Note (Signed)
[facility] Nurse wound consult note: Patient seen post code [place]  Reason for Consult:Bilateral LEs (venous insufficiency) and buttocks (MASD). Wound type:Venous insufficiency, MASD Pressure Injury POA: NA Measurement: Left LE >right LE for open areas. Largest area on left LE measures 4cm x 2.2cm x 0.1cm  Wound WEX:HBZJI red wound bed with serous exudate and peeling skin.  Hyperkeratotic tissue on bilateral Feet. Drainage (amount, consistency, odor) serous Periwound:dry, flaking Dressing procedure/placement/frequency: recommend ortho tech for bilateral Unna's boot placement once weekly and moisturizing with house emollient prior to those applications.  Gerhart's Butt cream is recommended for the buttocks and medial thighs for MASD.  Patient is on a mattress replacement with low air loss feature for persons of bariatric proportions.  Bilateral Heel boots are provided.  [place] nursing team will not follow, but will remain available to this patient, the nursing and medical teams.  Please re-consult if needed. Thanks, Maudie Flakes, MSN, RN, [place], Arther Abbott  Pager# [phone]

## 2018-07-31 NOTE — Procedures (Signed)
Extubation Procedure Note  Patient Details:   Name: [name] DOB: [birth year] MRN: [id number]   Airway Documentation:    Vent end date: 07/31/18 Vent end time: 1711   Evaluation  O2 sats: stable throughout Complications: No apparent complications Patient did tolerate procedure well. Bilateral Breath Sounds: Clear, Diminished   Yes   Positive cuff leak noted.  Pt placed on 4 L [place] with humidity, no stridor noted. Pt able to reach 375 using incentive spirometer.  Bayard Beaver 07/31/2018, 5:19 PM

## 2018-07-31 NOTE — Progress Notes (Signed)
Family Medicine Teaching Service Daily Progress Note Intern Pager: [phone]  Patient name: [name] Medical record number: [id number] Date of birth: 09/21/1941 Age: 77 y.o. Gender: male  Primary Care Provider: [place] Bing, DO  Code Status: Full Consultations: PT/OT, WC, CSW Admission date: 7/30  Pt Overview and Major Events to Date:  2018-08-08 patient coded during PT evaluation around 11:00. After 2 minutes chest compressions and 1 dose of epi patient developed ROSC.  Patient was intubated and referred to CCM.  ABG showed pH 7.191, PCO2 73.4  Subjective: Patient was doing this well this morning at first encounter.  Discontinued his oxygen and was still satting well to 100.  Patient tolerating meds well.  Overnight he had Foley removed and possible urinary retention.  Bladder scan showed 160-170 mL.   Patient has condom catheter on.  He denies any feeling of retention of urination.  Being treated with Diflucan for yeast urinary infection.  Urine output has been sufficient since admission.  Creatinine levels continue to improve last reading 0.77.  Wound care is on board to care for sacral wound and feet.  Removing Unna boots today to evaluate feet.  Patient was made aware that palliative care will be consulting him.  Discussed heart murmur with patient who endorses it is a congenital septal defect.  He has never seen a heart doctor.   Objective: Temp:  [97.7 F (36.5 C)-98.8 F (37.1 C)] 97.7 F (36.5 C) 08/09/2023 0419) Pulse Rate:  [51-92] 76 August 09, 2023 0419) Resp:  [18-22] 22 (08/01 2323) BP: (95-136)/(32-62) 95/62 09-Aug-2023 0419) SpO2:  [95 %-100 %] 100 % 2023-08-09 0419)  Physical Exam: General: Sitting up breathing and speaking well with no dyspnea. Cardiovascular: Regular rate and rhythm.  4 out of 6 holosystolic murmur. Respiratory: Clear bilaterally Abdomen: Obese Extremities: Edema in lower and upper extremities  Assessment and Plan:  Sepsis w/o shock 2/2 UTI/pneumonia/soft  tissue infection  -Chest x-ray was remarkable for vascular congestion in addition to small left effusion suspicious for atelectasis/pneumonia. -UA was positive for leukocyte esterase, negative nitrites, few bacteria, yeast present.              - urine appears to have improved color and consistency today. - Vancomycin (7/30-) - Meropenem IV (7/31-) -decubitus ulcer on posterior right thigh.              -patient states improvement in discomfort  -Blood cultures negative urine cultures positive for yeast  -Treat for yeast with Diflucan p.o. -WBC decreased to 11.7 -Monitor vitals -Continue to follow in ICU  SOB  Respiratory Acidosis:  New O2 requirement in ED for tachypnea. Initial ABG 7.27/57/118 with bicarb of 22 - consistent with respiratory acidosis.  -acidosis resolved -BiPAP qhs -Patient was at 5 L overnight.  O2 was decreased and removed this morning patient still stated 100% O2 - May need OSA work up as outpatient, OHS may also be component -Continue to follow in ICU  AKI Admission labs show a creatinine of 2.2 from a baseline of 0.8 -0.9. -Last creatinine was 0.77 -Avoid nephrotoxic agents -Monitor creatinine -Holding home PPI and Lasix   HFpEF chronic  -Metoprolol 50 mg twice daily -Follow-up BNP -Strict I's and O's - Daily weights - Holding lasix in setting of AKI - Consider repeat ECHO for possible worsening of CHF -Patient went into cardiac arrest today. ROSC achieved after 2 minutes compression and 1 dose of epi.  -Continue to follow progress in ICU  Decubitus ulcer posterior right thigh,  previously stage III  Sacral Ulcer Per nursing note patient has significant skin breakdown and posterior thighs gluteal area with stage III ulcer present during last admission.Is a possible source of infection contributing to his current sepsis. -Wound care is managing  [place] Intertrigo of abdominal pannus and intergluteal cleft -improving -Topical nystatin -  s/p IV diflucan in ED x1  Chronic venous insufficiency  Elephantiasis nostras verrucosaRLE Patient has a significant history of venous insufficiency, no new findings at this time. On exam wraps had been applied to both legs.  -Maintain compression wraps - Wound care consult   Diabetes Patient has a history of type 2 diabetes with a previous A1c of 9.3 on 05/19/2018. Home medications include Levemir 50? units twicedaily, Novolog 50 units twice daily. In the hospital so far have been in the 190s. -Moderate SSI -Monitor CBGs - Levemir 10 U BID  HTN Is on HCTZ and Triameterene, Lasix and metoprolol at home. Blood pressure 120/67 on admission.  - Vitals per floor protocol - Holding home Lasix, and HCTZ-triameterene in setting of AKI - Continue metoprolol for tachycardia and monitor BP  Anemia of chronic disease Chronic. Stable.  Hgb 10.7 on admission, 8.3 today - Monitor for signs of bleeding - CBC in am  HLD Pravastatin 40 mg taken at home. -Continue home medication  GERD Esmeprazole at home. -holding PPI in the setting of AKI  Prostatic Adenocarcinoma Per chart review, urologist is [facility]. CT abdomen/pelvis negative for any obvious pathology concerning for metastatic cancer, however prostate was not entirely visualized. This appears to not be concerning for acute process, recommend further outpatient work-up. PSA elevated at 12.94 on 5/21.  - Outpatient follow-up warranted - continue flomax  FEN/GI: normal diet PPx: UNNA boots.  remove to care for [facility]  Disposition: Unstable  Laboratory: Recent Labs  Lab 07/29/18 0228 07/30/18 0233 07/31/18 0419  WBC 20.1* 13.8* 11.7*  HGB 8.5* 8.3* 8.9*  HCT 28.5* 27.8* 30.5*  PLT 273 260 294   Recent Labs  Lab 07/28/18 1713 07/29/18 0228 07/30/18 0233 07/31/18 0419  NA 137 141 143 145  K 4.7 4.1 4.1 4.1  CL 99 103 110 110  CO2 22 27 27 29   BUN 60* 48* 30* 20  CREATININE 2.45* 1.54* 1.02 0.77    CALCIUM 8.8* 8.2* 8.1* 8.4*  PROT 8.1  --   --   --   BILITOT 1.5*  --   --   --   ALKPHOS 115  --   --   --   ALT 18  --   --   --   AST 41  --   --   --   GLUCOSE 204* 183* 110* 80  ABG pH 7.191 PCO2 73.4   Imaging/Diagnostic Tests: CT angio chest 8/02-negative for pulmonary embolism.  Bilateral pleural effusions and bibasilar atelectasis.  NG tip in distal esophagus.  Endotracheal tube in good position.  Aortic atherosclerosis.   Richarda Osmond, DO 07/31/2018, 6:57 AM PGY-1, [place] Intern pager: [phone], text pages welcome

## 2018-07-31 NOTE — Code Documentation (Signed)
  Patient Name: [name]   MRN: [id number]   Date of Birth/ Sex: [birth year] , male      Admission Date: 07/28/2018  Attending Provider: McDiarmid, Blane Ohara, MD  Primary Diagnosis: Sepsis [facility])   Indication: Pt was in his usual state of health until this this morning at just before 11am. He was returning from working with physical therapy when he collapsed on his bed and became unresponsive. He was found to be in PEA arrest. Code blue was subsequently called. At the time of arrival on scene, ACLS protocol was underway.   Technical Description:  - CPR performance duration:  20 minutes  - Was defibrillation or cardioversion used? No  - Was external pacer placed? No  - Was patient intubated pre/post CPR? Yes - post   Medications Administered: Y = Yes; Blank = No Amiodarone    Atropine    Calcium    Epinephrine  Y  Lidocaine    Magnesium    Norepinephrine    Phenylephrine    Sodium bicarbonate    Vasopressin     Post CPR evaluation:  - Final Status - Was patient successfully resuscitated ? Yes - What is current rhythm? Sinus tachcyardia - What is current hemodynamic status? HD stable, SBP >160 with palpable pulse  Miscellaneous Information:  - Labs sent, including: Standard CODE  - Primary team notified?  Yes-arrived shortly after code  - Family Notified? Yes  - Additional notes/ transfer status:  Primary team at bedside. Pt currently being intubated. CCM to be contacted by primary.      Davonn Flanery, DO  07/31/2018, 11:17 AM Internal Medicine Resident PGY3

## 2018-07-31 NOTE — Consult Note (Signed)
Cardiology Consultation:   Patient ID: [name]; [id number]; 12-06-1941   Admit date: 07/28/2018 Date of Consult: 07/31/2018  Primary Care Provider: LeRoy Bing, DO Primary Cardiologist: No primary care provider on file.    Patient Profile:   [name] is a 77 y.o. male with a hx of cardiac arrest who is being seen today for the evaluation of bradycardia at the request of Dr. Lake Bells.  History of Present Illness:   [name] is a 77 yo man with PMH osteomyelitis, diabetes, and chronic diastolic heart failure who had a cardiac arrest today. Please see PCCM consult note for history on his full hospitalization for sepsis beginning on 7/30.   He is now extubated and though tachypneic able to give some history. He reports that he had a mask on that was "too low" and felt very short of breath. He couldn't speak but was trying to wave his hands to let someone know. He felt like he was choking, and then he doesn't remember anything.  I reviewed his telemetry extensively. At 10:55 AM he was in sinus tachycardia in the 100-110s. Shortly after, he was in a normal sinus rhythm at 52 BPM. There is artifact shortly after that makes the pattern of p waves difficult to follow, but his QRS interval remains unchanged. Around 11:01 AM, his waveforms suggest he was undergoing CPR. The QRS appears under the artifact and appears unchanged. At 11:04 AM he has what appears to have VT and then SVT. He then returns to a sinus tachycardia. No pauses or significant bradycardia seen.  Past Medical History:  Diagnosis Date  . Chronic osteomyelitis of lower leg (HCC) 11/20/2010   Chronic osteoarthritis of right lower leg-10/03/10 note by Dr. Harvie Heck scanned of this visit under media tab. Dr. Sharol Given states that this is chronic osteomyelitis, only cure would be amputation. Patient was not agreeable to this at his visit with Dr. Sharol Given. Patient elected to apply Bactroban cream, Dr. Sharol Given and time of that visit  also prescribe doxycycline. Dr. Sharol Given stay the patient she come to the [facility] 02/26/2007   Qualifier: Diagnosis of  By: Erling Cruz  MD, [place]    . Complication of anesthesia   . Diabetes mellitus   . Diabetic peripheral neuropathy associated with type 2 diabetes mellitus ([place]) 02/07/2014  . GERD (gastroesophageal reflux disease)   . Grade II diastolic dysfunction   . Heart murmur    last 2D Echo -03/31/08  . HIATAL HERNIA WITH REFLUX 09/29/2006   Qualifier: Diagnosis of  By: Erling Cruz  MD, MELISSA    . HIP REPLACEMENT, BILATERAL, HX OF 05/12/2007   Qualifier: Diagnosis of  By: Erling Cruz  MD, MELISSA    . History of blood transfusion   . Hypertension   . OSTEOARTHRITIS 03/05/2007   Qualifier: Diagnosis of  By: Erling Cruz  MD, MELISSA    . PONV (postoperative nausea and vomiting)   . Prostate cancer ([place])   . SBO (small bowel obstruction) ([place]) 02/2017  . Seasonal allergies   . VENOUS INSUFFICIENCY, CHRONIC 02/26/2007   Qualifier: Diagnosis of  By: Erling Cruz  MD, MELISSA      Past Surgical History:  Procedure Laterality Date  . COLONOSCOPY    . COLONOSCOPY    . HIP ARTHROPLASTY     bil  . I&D EXTREMITY  08/14/2012   Procedure: IRRIGATION AND DEBRIDEMENT EXTREMITY;  Surgeon: Newt Minion, MD;  Location: [facility];  Service: Orthopedics;  Laterality: Right;  Irrigation  and Debridement Right tibia, Placement antibiotic beads  . I&D EXTREMITY Left 03/18/2014   Procedure: IRRIGATION AND DEBRIDEMENT EXTREMITY;  Surgeon: Newt Minion, MD;  Location: [facility];  Service: Orthopedics;  Laterality: Left;  Debridement Left Calf Ulcer, Apply Theraskin and Wound VAC  . IRRIGATION AND DEBRIDEMENT ABSCESS Left 03/18/2014   DR DUDA  . LEG SURGERY     ROD for fracture  . LEG WOUND REPAIR / CLOSURE     Beads .  Skin wound  . SKIN GRAFT     right leg- from donor skin  . SKIN GRAFT  1976   R wrist   skin graft from left thigh  . UPPER GASTROINTESTINAL ENDOSCOPY    . WISDOM TOOTH EXTRACTION    . WRIST FUSION     right wrist      Home Medications:  Prior to Admission medications   Medication Sig Start Date End Date Taking? Authorizing Provider  aspirin 81 MG chewable tablet Chew 81 mg by mouth every other day.     [provider]  EASY TOUCH PEN NEEDLES 31G X 8 MM MISC USE WITH FLEXPEN AS DIRECTED 01/23/18   Mikell, Jeani Sow, MD  esomeprazole (NEXIUM) 40 MG capsule TAKE 1 CAPSULE BY MOUTH EVERY DAY 03/19/18   Mikell, Jeani Sow, MD  FLOVENT HFA 220 MCG/ACT inhaler INHALE 1 PUFF INTO THE LUNGS TWICE DAILY 10/02/17   Mikell, Jeani Sow, MD  furosemide (LASIX) 40 MG tablet Take 1 tablet (40 mg total) by mouth 2 (two) times daily. Patient taking differently: Take 80 mg by mouth 2 (two) times daily.  07/13/18   [place] Bing, DO  insulin aspart (NOVOLOG) 100 UNIT/ML injection Inject 50 Units into the skin 2 (two) times daily.     [provider]  Insulin Detemir (LEVEMIR FLEXTOUCH) 100 UNIT/ML Pen Inject 20 Units into the skin daily. Patient taking differently: Inject 50 Units into the skin 2 (two) times daily.  05/23/18   [place] Bussing, MD  Lancet Devices (ONE TOUCH DELICA LANCING DEV) MISC 75 Units by Does not apply route 2 (two) times daily. 04/11/16   Rosemarie Ax, MD  latanoprost (XALATAN) 0.005 % ophthalmic solution INSTILL 1 DROP IN BOTH EYES EVERY EVENING FOR GLAUCOMA 07/08/18   [place] Bing, DO  metoprolol tartrate (LOPRESSOR) 100 MG tablet Take 50 mg by mouth 2 (two) times daily.    [provider]  mometasone (NASONEX) 50 MCG/ACT nasal spray INSTILL 2 SPRAYS INTO THE NOSE DAILY 07/08/18   Lynndyl Bing, DO  nystatin (MYCOSTATIN/NYSTOP) powder Apply topically 2 (two) times daily. 07/13/18   [place] Bing, DO  ONE TOUCH ULTRA TEST test strip USE TO CHECK BLOOD SUGAR FOUR TIMES A DAY 09/22/15   Rosemarie Ax, MD  ONE [facility] ULTRA TEST test strip USE TO TEST BLOOD SUGAR TWICE DAILY 02/19/18   Mikell, Jeani Sow, MD  [facility] DELICA LANCETS 84Z MISC USE  TO CHECK BLOOD SUGAR TWICE DAILY. 07/08/18   [place] Bing, DO  pravastatin (PRAVACHOL) 40 MG tablet TAKE 1 TABLET BY MOUTH DAILY 12/26/17   Mikell, Jeani Sow, MD  PROAIR HFA 108 [phone] Base) MCG/ACT inhaler INHALE 2 PUFFS BY MOUTH EVERY 4 HOURS AS NEEDED FOR WHEEZING OR SHORTNESS OF BREATH 04/18/18   Mikell, Jeani Sow, MD  tamsulosin (FLOMAX) 0.4 MG CAPS capsule TAKE 2 CAPSULES BY MOUTH EVERY MORNING 07/08/18    Bing, DO  traMADol (ULTRAM) 50 MG tablet TAKE 1 TABLET  BY MOUTH TWICE DAILY AS NEEDED FOR PAIN 07/09/18   Newt Minion, MD  triamterene-hydrochlorothiazide (MAXZIDE-25) 37.5-25 MG tablet Take 1 tablet by mouth daily. 07/01/18   [provider]    Inpatient Medications: Scheduled Meds: . budesonide (PULMICORT) nebulizer solution  0.5 mg Nebulization BID  . chlorhexidine  15 mL Mouth Rinse BID  . enoxaparin (LOVENOX) injection  75 mg Subcutaneous Q24H  . fluconazole  400 mg Oral Daily  . Gerhardt's butt cream   Topical TID  . insulin aspart  0-15 Units Subcutaneous TID WC  . insulin detemir  5 Units Subcutaneous BID  . mouth rinse  15 mL Mouth Rinse q12n4p  . nystatin   Topical BID  . pravastatin  40 mg Oral Daily  . tamsulosin  0.8 mg Oral q morning - 10a  . urea   Topical BID   Continuous Infusions: . sodium chloride Stopped (07/31/18 1235)  . famotidine (PEPCID) IV 20 mg (07/31/18 1818)  . meropenem (MERREM) IV Stopped (07/31/18 1235)  . vancomycin Stopped (07/31/18 1104)   PRN Meds: acetaminophen **OR** acetaminophen, fentaNYL (SUBLIMAZE) injection  Allergies:    Allergies  Allergen Reactions  . Ace Inhibitors Cough    With Lisinopril.  . Angiotensin Receptor Blockers Other (See Comments)    Caused excessive weight gain  . Metformin Diarrhea    Social History:   Social History   Socioeconomic History  . Marital status: Widowed    Spouse name: Not on file  . Number of children: 4  . Years of education: 96  . Highest education level:  Not on file  Occupational History  . Occupation: retired- Administrator  Social Needs  . Financial resource strain: Not on file  . Food insecurity:    Worry: Not on file    Inability: Not on file  . Transportation needs:    Medical: Not on file    Non-medical: Not on file  Tobacco Use  . Smoking status: Never Smoker  . Smokeless tobacco: Never Used  Substance and Sexual Activity  . Alcohol use: No  . Drug use: No  . Sexual activity: Not on file  Lifestyle  . Physical activity:    Days per week: Not on file    Minutes per session: Not on file  . Stress: Not on file  Relationships  . Social connections:    Talks on phone: Not on file    Gets together: Not on file    Attends religious service: Not on file    Active member of club or organization: Not on file    Attends meetings of clubs or organizations: Not on file    Relationship status: Not on file  . Intimate partner violence:    Fear of current or ex partner: Not on file    Emotionally abused: Not on file    Physically abused: Not on file    Forced sexual activity: Not on file  Other Topics Concern  . Not on file  Social History Narrative   Health Care POA:    Emergency Contact: daughter, Cheri Rous [phone]   End of Life Plan: gave pt AD pamphlet   Who lives with you: self   Any pets: none   Diet: pt has a varied diet of protein, starch and vegetables.    Exercise: Pt does not have regular exercise routine.  We discussed starting a regular chair exercise program.   Seatbelts: Pt reports wearing seatbelt when in vehicles.  Hobbies: watching car races          Family History:    Family History  Problem Relation Age of Onset  . Hypertension Mother      ROS:  Please see the history of present illness.  Denies chest pain, palpitations. Feels very SOB. Able to give brief yes/no answers due to tachypnea, but denies other significant complaints. All other ROS reviewed and negative.     Physical Exam/Data:    Vitals:   07/31/18 1641 07/31/18 1700 07/31/18 1713 07/31/18 1800  BP: (!) 105/53 134/81 (!) 134/91 (!) 109/54  Pulse: 75 85 (!) 110 (!) 40  Resp:  20 18 17   Temp:      TempSrc:      SpO2: 100% 93% 94% (!) 80%  Weight:      Height:        Intake/Output Summary (Last 24 hours) at 07/31/2018 1922 Last data filed at 07/31/2018 1818 Gross per 24 hour  Intake 1541.68 ml  Output 600 ml  Net 941.68 ml   Filed Weights   07/28/18 2038 07/29/18 0602 07/30/18 0632  Weight: (!) 331 lb 12.7 oz (150.5 kg) (!) 331 lb 12.7 oz (150.5 kg) (!) 331 lb 12.7 oz (150.5 kg)   Body mass index is 45 kg/m.  General:  Well nourished, well developed, appears tachypneic and can only give brief responses. HEENT: Normocephalic, [place] in place Lymph: no adenopathy Neck: no JVD appreciated, difficult due to body habitus Endocrine:  No thryomegaly Vascular: No carotid bruits; FA pulses 2+ bilaterally without bruits  Cardiac:  normal S1, S2; RRR; no murmur Lungs:  Coarse bilaterally Abd: soft, nontender, no hepatomegaly  Ext: chronic lower extremity skin changes, no pitting edema Musculoskeletal:  No deformities, moves all limbs independently Skin: warm and dry  Neuro:  no focal abnormalities noted Psych:  Normal affect   EKG:  The EKG was personally reviewed and demonstrates:  Sinus rhythm with PACs Telemetry:  Telemetry was personally reviewed and demonstrates:  Sinus tachycardia on interview  Relevant CV Studies: Echo 05/21/18 Left ventricle: The cavity size was normal. There was mild   concentric hypertrophy. Systolic function was normal. The   estimated ejection fraction was in the range of 60% to 65%. Wall   motion was normal; there were no regional wall motion   abnormalities. Features are consistent with a pseudonormal left   ventricular filling pattern, with concomitant abnormal relaxation   and increased filling pressure (grade 2 diastolic dysfunction).   Doppler parameters are consistent with  high ventricular filling   pressure. - Aortic valve: Valve mobility was restricted. - Mitral valve: Calcified annulus. Mild focal calcification of the   anterior leaflet. - Left atrium: The atrium was mildly dilated. - Right ventricle: The cavity size was mildly dilated. Wall   thickness was normal. Systolic function was mildly reduced. - Tricuspid valve: There was trivial regurgitation. - Pulmonary arteries: Systolic pressure could not be accurately   estimated.  Laboratory Data:  Chemistry Recent Labs  Lab 07/29/18 0228 07/30/18 0233 07/31/18 0419  NA 141 143 145  K 4.1 4.1 4.1  CL 103 110 110  CO2 27 27 29   GLUCOSE 183* 110* 80  BUN 48* 30* 20  CREATININE 1.54* 1.02 0.77  CALCIUM 8.2* 8.1* 8.4*  GFRNONAA 41* >60 >60  GFRAA 48* >60 >60  ANIONGAP 11 6 6     Recent Labs  Lab 07/28/18 1713  PROT 8.1  ALBUMIN 2.5*  AST 41  ALT 18  ALKPHOS 115  BILITOT 1.5*   Hematology Recent Labs  Lab 07/29/18 0228 07/30/18 0233 07/31/18 0419  WBC 20.1* 13.8* 11.7*  RBC 3.35* 3.21* 3.46*  HGB 8.5* 8.3* 8.9*  HCT 28.5* 27.8* 30.5*  MCV 85.1 86.6 88.2  MCH 25.4* 25.9* 25.7*  MCHC 29.8* 29.9* 29.2*  RDW 15.9* 16.1* 16.4*  PLT 273 260 294   Cardiac EnzymesNo results for input(s): TROPONINI in the last 168 hours.  Recent Labs  Lab 07/28/18 1432 07/28/18 1446  TROPIPOC 0.00 0.00    BNP Recent Labs  Lab 07/28/18 1713  BNP 61.6    DDimer  Recent Labs  Lab 07/29/18 1126  DDIMER 3.86*    Radiology/Studies:  Ct Abdomen Pelvis Wo Contrast  Result Date: 07/29/2018 CLINICAL DATA:  Right lower and upper quadrant abdominal pain. Appendicitis suspected. EXAM: CT ABDOMEN AND PELVIS WITHOUT CONTRAST TECHNIQUE: Multidetector CT imaging of the abdomen and pelvis was performed following the standard protocol without IV contrast. COMPARISON:  CT abdomen and pelvis 05/19/2018. FINDINGS: Lower chest: Right lower lobe airspace disease is noted. This likely reflects atelectasis.  Heart is mildly enlarged. No pleural or pericardial effusion is present. The left lung base is incompletely visualized. Left lower lobe airspace disease is present. Hepatobiliary: Liver is unremarkable. Multiple radiopaque gallstones are present. No significant inflammatory change low gallbladder. The common bile duct is within normal limits. Pancreas: Unremarkable. No pancreatic ductal dilatation or surrounding inflammatory changes. Spleen: Normal in size without focal abnormality. Adrenals/Urinary Tract: Adrenal glands are within normal limits bilaterally. Kidneys and ureters are unremarkable. There is no stone or mass lesion. No hydronephrosis is present. Urinary bladder is partially obscured by metal artifact. There appears to be gas within the urinary bladder. Question instrumentation. Stomach/Bowel: Stomach and duodenum are within normal limits. Small bowel is unremarkable. Terminal ileum is within normal limits. The appendix is visualized and normal. Ascending and transverse colon are within normal limits. Descending and sigmoid colon are normal. Vascular/Lymphatic: Atherosclerotic calcifications are present in the aorta and branch vessels without aneurysm. Reproductive: Unremarkable. Other: Minimal fluid is present about the free edge of the liver. No other significant free fluid is present. There is no significant ventral hernia. Musculoskeletal: Vacuum disc is present at L4-5. Vertebral body heights are maintained. Pelvis is partially obscured by artifact from bilateral total hip arthroplasties. IMPRESSION: 1. Gas within the urinary bladder. Question infection or recent instrumentation. 2. Normal appearance of the appendix. 3. Cholelithiasis without evidence for cholecystitis. 4. Bibasilar airspace disease and effusions. While this may represent atelectasis, infection is not excluded. Electronically Signed   By: San Morelle M.D.   On: 07/29/2018 18:42   Ct Angio Chest Pe W Or Wo  Contrast  Result Date: 07/31/2018 CLINICAL DATA:  Cardiac arrest EXAM: CT ANGIOGRAPHY CHEST WITH CONTRAST TECHNIQUE: Multidetector CT imaging of the chest was performed using the standard protocol during bolus administration of intravenous contrast. Multiplanar CT image reconstructions and MIPs were obtained to evaluate the vascular anatomy. CONTRAST:  164mL ISOVUE-370 IOPAMIDOL (ISOVUE-370) INJECTION 76% COMPARISON:  Chest 07/31/2018, CT chest 05/19/2018 FINDINGS: Cardiovascular: Negative for pulmonary embolism. Heart size upper normal. Mild coronary calcification. Atherosclerotic thoracic aorta. Aorta not adequately opacified to evaluate for dissection. Negative for pericardial effusion Mediastinum/Nodes: Negative for mass or adenopathy. The patient is intubated. NG tip distal esophagus not entering the stomach. Lungs/Pleura: Small bilateral effusions with bibasilar atelectasis left greater than right. Upper lobes clear bilaterally. Upper Abdomen: No acute abnormality Musculoskeletal: Negative Review of the MIP images confirms the  above findings. IMPRESSION: Negative for pulmonary embolism Mild coronary calcification Bilateral pleural effusions and bibasilar atelectasis NG tip in the distal esophagus.  Endotracheal tube in good position. Aortic Atherosclerosis (ICD10-I70.0). Electronically Signed   By: Franchot Gallo M.D.   On: 07/31/2018 14:37   Nm Pulmonary Perf And Vent  Result Date: 07/29/2018 CLINICAL DATA:  Weakness, lethargy, elevated D-dimer EXAM: NUCLEAR MEDICINE VENTILATION - PERFUSION LUNG SCAN TECHNIQUE: Ventilation images were obtained in multiple projections using inhaled aerosol Tc-12m DTPA. Perfusion images were obtained in multiple projections after intravenous injection of Tc-20m-MAA. RADIOPHARMACEUTICALS:  4.2 mCi of Tc-44m DTPA aerosol inhalation and 31.2 mCi Tc54m-MAA IV COMPARISON:  None. FINDINGS: Ventilation: Mildly heterogeneous ventilation without suspicious ventilation defect.  Perfusion: No wedge shaped peripheral perfusion defects to suggest acute pulmonary embolism. Recent chest radiograph demonstrates bibasilar atelectasis and possible small left pleural effusion. IMPRESSION: Negative for pulmonary embolism. Electronically Signed   By: Julian Hy M.D.   On: 07/29/2018 19:29   Dg Chest Port 1 View  Result Date: 07/31/2018 CLINICAL DATA:  Intubation. EXAM: PORTABLE CHEST 1 VIEW COMPARISON:  07/28/2018. FINDINGS: Endotracheal tube noted with tip 5.1 cm above the carina. NG tube noted, its distal tip is difficult to discern due to overlying leads and pads. The distal tip may be in the distal esophagus, repeat image can be obtained. Cardiomegaly with pulmonary venous congestion. Low lung volumes with mild bibasilar atelectasis. No prominent pleural effusion. No pneumothorax. IMPRESSION: 1. Endotracheal tube noted with tip 5.1 cm above the carina. NG tube noted, its tip is difficult to discern due to overlying leads and pads. Distal NG tube tip may be in the distal esophagus, repeat image can be obtained. 2. Cardiomegaly with pulmonary venous congestion. Low lung volumes with mild bibasilar atelectasis. Electronically Signed   By: Marcello Moores  Register   On: 07/31/2018 12:11   Dg Chest Port 1 View  Result Date: 07/28/2018 CLINICAL DATA:  Sepsis EXAM: PORTABLE CHEST 1 VIEW COMPARISON:  CT chest 06/04/2018, radiograph 06/04/2018, 05/19/2018 FINDINGS: Small left pleural effusion with lingular and left base opacity. Cardiomegaly with mild vascular congestion. No pneumothorax. IMPRESSION: 1. Cardiomegaly with vascular congestion 2. Probable small left effusion. Airspace disease at the lingula and left base may reflect atelectasis or pneumonia. Electronically Signed   By: Donavan Foil M.D.   On: 07/28/2018 16:37   Dg Abd Portable 1v  Result Date: 07/31/2018 CLINICAL DATA:  Orogastric tube placement. EXAM: PORTABLE ABDOMEN - 1 VIEW COMPARISON:  CT scan of July 29, 2018. FINDINGS:  Moderate gastric distention is noted. No large or small bowel dilatation is noted. Distal tip of orogastric tube is not well visualized as it may be at top of field-of-view. IMPRESSION: Moderate gastric distention. Distal tip of orogastric tube is not well visualized, but may be present at top of field of view. Repeat radiograph performed at higher level is recommended. Electronically Signed   By: Marijo Conception, M.D.   On: 07/31/2018 12:14    Assessment and Plan:   1. Cardiac arrest, concern for bradycardic arrest -did not have any pauses or block on telemetry. Did have sinus bradycardia and possibly VT vs. Artifact during code, but no ST elevations after the code or preceding arrhythmias prior to the code. Telemetry suggests a PEA arrest.  No acute indication for invasive cardiac evaluation. Can repeat echo if questions remain about his cardiac status.  [facility] will sign off, but if there are additional questions please contact us.   Medication Recommendations:  No change. Other recommendations (labs, testing, etc):  none Follow up as an outpatient:  As needed  For questions or updates, please contact [place] Please consult [email or web address] for contact info under Cardiology/STEMI.   Signed, Buford Dresser, MD  07/31/2018 7:22 PM

## 2018-07-31 NOTE — Progress Notes (Signed)
RT assisted in transport of pt on vent to CT and returned to 2M05. Pt's vitals remained stable throughout the trip.

## 2018-07-31 NOTE — Progress Notes (Signed)
1055: patient unresponsive post physical therapy session. Code blue called by physical therapy. ACLS commenced. Rapid, respiratory, MD at bedside.   1155: patient transferred to 2M05. Daughter called and notified of patients condition and new room. All questions answered. Report called to [facility].

## 2018-07-31 NOTE — Progress Notes (Signed)
Physical Therapy Treatment Patient Details Name: [name] MRN: [id number] DOB: [birth year] Today's Date: 07/31/2018    History of Present Illness 77 yo male who presented with weakness and lethargy.  PMH is significant for prostate adenocarcinoma, HFpEF (EF 60-65%), anemia, BLE lymphadema, T2DM, elephantiasis nostras verrucosa RLE, impaired mobility, morbid obesity, HTN, HLD, GERD. Admitted with sepsis, respiratory acidosis and AKI.     PT Comments    Patient progressed during session as was able to stand and take steps with walker with assist.  Seemed medically stable until returning to supine and struggling for air then stopped breathing and code was called.  Still feel he will need SNF level rehab.  PT to follow as appropriate.    Follow Up Recommendations  SNF     Equipment Recommendations  None recommended by PT    Recommendations for Other Services       Precautions / Restrictions Precautions Precautions: Fall Restrictions Weight Bearing Restrictions: No    Mobility  Bed Mobility Overal bed mobility: Needs Assistance Bed Mobility: Supine to Sit;Sit to Supine Rolling: +2 for physical assistance;Max assist   Supine to sit: Max assist;+2 for physical assistance Sit to supine: Max assist;+2 for physical assistance   General bed mobility comments: unable to roll so assisted with legs off bed and to lift trunk upright, to supine assist for legs and trunk  Transfers Overall transfer level: Needs assistance Equipment used: Rolling walker (2 wheeled) Transfers: Sit to/from Stand Sit to Stand: Max assist;+2 physical assistance;From elevated surface         General transfer comment: increased time, momentum and lifting assist of 2  Ambulation/Gait Ambulation/Gait assistance: Mod assist;+2 physical assistance Gait Distance (Feet): 1 Feet Assistive device: Rolling walker (2 wheeled) Gait Pattern/deviations: Step-to pattern;Wide base of support     General Gait  Details: side steps to [facility] with RW and assist   Stairs             Wheelchair Mobility    Modified Rankin (Stroke Patients Only)       Balance Overall balance assessment: Needs assistance Sitting-balance support: Feet supported;Bilateral upper extremity supported Sitting balance-Leahy Scale: Poor Sitting balance - Comments: heavy UE support while sitting EOB several minutes waiting for wide RW (painful on bottom to sit EOB so lifting himself up with his arms)   Standing balance support: Bilateral upper extremity supported Standing balance-Leahy Scale: Poor Standing balance comment: UE support on walker and assist for standing; stood for several minutes while NT's assisting with hygiene as pt incontinent of stool in standing and was soiled in bed.                            Cognition Arousal/Alertness: Awake/alert Behavior During Therapy: WFL for tasks assessed/performed Overall Cognitive Status: Within Functional Limits for tasks assessed                                        Exercises General Exercises - Lower Extremity Heel Slides: AAROM;10 reps;5 reps;Both;Supine    General Comments General comments (skin integrity, edema, etc.): VSS Throughout treatment until pt c/o SOB upon return to supine so HOB was elevated, pt continued to look uncomfortable, preparations made to scoot him up in bed, but then pt unresponsive and then not breathing so NT pulled code button and code team arrived and began CPR  as HR down to 30's.      Pertinent Vitals/Pain Faces Pain Scale: Hurts even more Pain Location: buttock wound Pain Descriptors / Indicators: Grimacing;Guarding Pain Intervention(s): Monitored during session;Repositioned;Limited activity within patient's tolerance    Home Living                      Prior Function            PT Goals (current goals can now be found in the care plan section) Progress towards PT goals: PT to  reassess next treatment;Progressing toward goals(progressed during treatment, but coded end of treatment)    Frequency    Min 2X/week      PT Plan Current plan remains appropriate    Co-evaluation              AM-PAC PT "6 Clicks" Daily Activity  Outcome Measure  Difficulty turning over in bed (including adjusting bedclothes, sheets and blankets)?: Unable Difficulty moving from lying on back to sitting on the side of the bed? : Unable Difficulty sitting down on and standing up from a chair with arms (e.g., wheelchair, bedside commode, etc,.)?: Unable Help needed moving to and from a bed to chair (including a wheelchair)?: A Lot Help needed walking in hospital room?: A Lot Help needed climbing 3-5 steps with a railing? : Total 6 Click Score: 8    End of Session Equipment Utilized During Treatment: Gait belt Activity Tolerance: Treatment limited secondary to medical complications (Comment)(coded once back in supine) Patient left: in bed;Other (comment)(with code team)   PT Visit Diagnosis: Muscle weakness (generalized) (M62.81);Other abnormalities of gait and mobility (R26.89);Difficulty in walking, not elsewhere classified (R26.2)     Time: 1020-1100 PT Time Calculation (min) (ACUTE ONLY): 40 min  Charges:  $Therapeutic Exercise: 8-22 mins $Therapeutic Activity: 23-37 mins                     [place], [place] [phone] 07/31/2018    Reginia Naas 07/31/2018, 12:19 PM

## 2018-07-31 NOTE — Progress Notes (Addendum)
Consult received 07/30/18 for [place]. Went to see pt and he was in middle of a code. Per PT, he was just working with PT, verbalized need to sit down, then laid down and became unresponsive. Pt has been electively intubated and transferred to ICU. Palliative medicine to FU over the weekend for initial [place] consultation. Thank you, Romona Curls, ANP

## 2018-07-31 NOTE — Progress Notes (Signed)
[facility] PCCM  CT angiogram OK Remains hemodynamically stable Oxygenation is OK Passing SBT  Plan extubation Cardiology consult: was this a bradycardic event?  Roselie Awkward, MD [place] P[facility]M Pager: [phone] Cell: [phone] After 3pm or if no response, call [phone]

## 2018-07-31 NOTE — Progress Notes (Signed)
   07/31/18 1100  Clinical Encounter Type  Visited With Health care provider  Visit Type Critical Care;Code  Referral From Nurse  Consult/Referral To Chaplain   Responded to Code Blue.  Medical Team involved.  Called and left messages for his daughter. Chaplain Hassell Done will stay and support the family.  Chaplain Katherene Ponto

## 2018-07-31 NOTE — Consult Note (Signed)
PULMONARY / CRITICAL CARE MEDICINE   Name: [name] MRN: [id number] DOB: [birth year]    ADMISSION DATE:  07/28/2018 CONSULTATION DATE:  07/31/2018  REFERRING MD:  Dr. McDiarmid  CHIEF COMPLAINT:  Cardiac arrest  HISTORY OF PRESENT ILLNESS:   77 y/o male with a history of osteomyelitis and diabetes and diastolic heart failure presented from a SNF on 7/30 with lethargy and was presumed to have sepsis in the ER based on a lactic acidosis and WBC greater than 20K.  He was also noted to have a new O2 requirement.  He had AKI on admission.  He was given IV antibiotics and fluids and his lab parameters improved.  Because of his increased O2 requirements and his elevated D-dimer he had a V/Q scan performed which was negative.  There were many possible causes of sepsis including possible pneumonia, wound infection and a UTI.  On the morning of 8/2 he was feeling well and was sitting up talking to the rounding team.  However later he got up with physical therapy and suddenly became incontinent of bowel and bladder and became unresponsive and bradycardic.  CPR was initiated and he received 2 minutes of chest compressions and 1 amp of epinephrine.  He recovered and was responsive but still hypoxemic and confused so he was electively intubated.  PCCM was consulted for transfer to the ICU.   PAST MEDICAL HISTORY :  He  has a past medical history of Chronic osteomyelitis of lower leg ([place]) (11/20/2010), COLON POLYP (2/83/1517), Complication of anesthesia, Diabetes mellitus, Diabetic peripheral neuropathy associated with type 2 diabetes mellitus ([place]) (02/07/2014), GERD (gastroesophageal reflux disease), Grade II diastolic dysfunction, Heart murmur, HIATAL HERNIA WITH REFLUX (09/29/2006), HIP REPLACEMENT, BILATERAL, HX OF (05/12/2007), History of blood transfusion, Hypertension, OSTEOARTHRITIS (03/05/2007), PONV (postoperative nausea and vomiting), Prostate cancer ([place]), SBO (small bowel obstruction) ([place]) (02/2017),  Seasonal allergies, and VENOUS INSUFFICIENCY, CHRONIC (02/26/2007).  PAST SURGICAL HISTORY: He  has a past surgical history that includes Hip Arthroplasty; Leg wound repair / closure; Skin graft; Skin graft (1976); Wrist fusion; Colonoscopy; Upper gastrointestinal endoscopy; Leg Surgery; Wisdom tooth extraction; I&D extremity (08/14/2012); Colonoscopy; Irrigation and debridement abscess (Left, 03/18/2014); and I&D extremity (Left, 03/18/2014).  Allergies  Allergen Reactions  . Ace Inhibitors Cough    With Lisinopril.  . Angiotensin Receptor Blockers Other (See Comments)    Caused excessive weight gain  . Metformin Diarrhea    No current facility-administered medications on file prior to encounter.    Current Outpatient Medications on File Prior to Encounter  Medication Sig  . aspirin 81 MG chewable tablet Chew 81 mg by mouth every other day.   Marland Kitchen EASY TOUCH PEN NEEDLES 31G X 8 MM MISC USE WITH FLEXPEN AS DIRECTED  . esomeprazole (NEXIUM) 40 MG capsule TAKE 1 CAPSULE BY MOUTH EVERY DAY  . FLOVENT HFA 220 MCG/ACT inhaler INHALE 1 PUFF INTO THE LUNGS TWICE DAILY  . furosemide (LASIX) 40 MG tablet Take 1 tablet (40 mg total) by mouth 2 (two) times daily. (Patient taking differently: Take 80 mg by mouth 2 (two) times daily. )  . insulin aspart (NOVOLOG) 100 UNIT/ML injection Inject 50 Units into the skin 2 (two) times daily.   . Insulin Detemir (LEVEMIR FLEXTOUCH) 100 UNIT/ML Pen Inject 20 Units into the skin daily. (Patient taking differently: Inject 50 Units into the skin 2 (two) times daily. )  . Lancet Devices (ONE TOUCH DELICA LANCING DEV) MISC 75 Units by Does not apply route 2 (two) times daily.  Marland Kitchen  latanoprost (XALATAN) 0.005 % ophthalmic solution INSTILL 1 DROP IN BOTH EYES EVERY EVENING FOR GLAUCOMA  . metoprolol tartrate (LOPRESSOR) 100 MG tablet Take 50 mg by mouth 2 (two) times daily.  . mometasone (NASONEX) 50 MCG/ACT nasal spray INSTILL 2 SPRAYS INTO THE NOSE DAILY  . nystatin  (MYCOSTATIN/NYSTOP) powder Apply topically 2 (two) times daily.  . ONE TOUCH ULTRA TEST test strip USE TO CHECK BLOOD SUGAR FOUR TIMES A DAY  . ONE TOUCH ULTRA TEST test strip USE TO TEST BLOOD SUGAR TWICE DAILY  . ONETOUCH DELICA LANCETS 56E MISC USE TO CHECK BLOOD SUGAR TWICE DAILY.  . pravastatin (PRAVACHOL) 40 MG tablet TAKE 1 TABLET BY MOUTH DAILY  . PROAIR HFA 108 (90 Base) MCG/ACT inhaler INHALE 2 PUFFS BY MOUTH EVERY 4 HOURS AS NEEDED FOR WHEEZING OR SHORTNESS OF BREATH  . tamsulosin (FLOMAX) 0.4 MG CAPS capsule TAKE 2 CAPSULES BY MOUTH EVERY MORNING  . traMADol (ULTRAM) 50 MG tablet TAKE 1 TABLET BY MOUTH TWICE DAILY AS NEEDED FOR PAIN  . triamterene-hydrochlorothiazide (MAXZIDE-25) 37.5-25 MG tablet Take 1 tablet by mouth daily.    FAMILY HISTORY/SOCIAL HISTORY/REVIEW OF SYSTEMS:   Cannot obtain due to intubation .  SUBJECTIVE:  As above  VITAL SIGNS: BP 120/67   Pulse 100   Temp (!) 97.5 F (36.4 C) (Oral)   Resp 20   Ht 6' (1.829 m)   Wt (!) 150.5 kg (331 lb 12.7 oz)   SpO2 98%   BMI 45.00 kg/m   HEMODYNAMICS:    VENTILATOR SETTINGS: Vent Mode: PRVC FiO2 (%):  [100 %] 100 % Set Rate:  [18 bmp] 18 bmp Vt Set:  [332 mL] 620 mL PEEP:  [5 cmH20] 5 cmH20 Plateau Pressure:  [16 cmH20] 16 cmH20  INTAKE / OUTPUT: I/O last 3 completed shifts: In: 2679 [P.O.:437; I.V.:1278; IV Piggyback:963.9] Out: 9518 [Urine:1275]  PHYSICAL EXAMINATION:  General:  In bed on vent HENT: NCAT ETT in place PULM: CTA B, vent supported breathing CV: RRR, systolic murmur RUSB GI: BS+, soft, nontender MSK: normal bulk and tone Neuro: awake on vent, follows commands    LABS:  BMET Recent Labs  Lab 07/29/18 0228 07/30/18 0233 07/31/18 0419  NA 141 143 145  K 4.1 4.1 4.1  CL 103 110 110  CO2 27 27 29   BUN 48* 30* 20  CREATININE 1.54* 1.02 0.77  GLUCOSE 183* 110* 80    Electrolytes Recent Labs  Lab 07/29/18 0228 07/30/18 0233 07/31/18 0419  CALCIUM 8.2* 8.1*  8.4*    CBC Recent Labs  Lab 07/29/18 0228 07/30/18 0233 07/31/18 0419  WBC 20.1* 13.8* 11.7*  HGB 8.5* 8.3* 8.9*  HCT 28.5* 27.8* 30.5*  PLT 273 260 294    Coag's Recent Labs  Lab 07/28/18 1713  INR 1.30    Sepsis Markers Recent Labs  Lab 07/28/18 1507 07/28/18 1718  LATICACIDVEN 3.25* 1.74    ABG Recent Labs  Lab 07/28/18 2149 07/29/18 0139 07/31/18 1121  PHART 7.277* 7.299* 7.191*  PCO2ART 57.1* 55.5* 73.4*  PO2ART 118* 107 103    Liver Enzymes Recent Labs  Lab 07/28/18 1713  AST 41  ALT 18  ALKPHOS 115  BILITOT 1.5*  ALBUMIN 2.5*    Cardiac Enzymes No results for input(s): TROPONINI, PROBNP in the last 168 hours.  Glucose Recent Labs  Lab 07/29/18 2327 07/30/18 0803 07/30/18 1219 07/30/18 1714 07/30/18 2100 07/31/18 0839  GLUCAP 112* 91 123* 123* 99 73    Imaging Dg Chest  Port 1 View  Result Date: 07/31/2018 CLINICAL DATA:  Intubation. EXAM: PORTABLE CHEST 1 VIEW COMPARISON:  07/28/2018. FINDINGS: Endotracheal tube noted with tip 5.1 cm above the carina. NG tube noted, its distal tip is difficult to discern due to overlying leads and pads. The distal tip may be in the distal esophagus, repeat image can be obtained. Cardiomegaly with pulmonary venous congestion. Low lung volumes with mild bibasilar atelectasis. No prominent pleural effusion. No pneumothorax. IMPRESSION: 1. Endotracheal tube noted with tip 5.1 cm above the carina. NG tube noted, its tip is difficult to discern due to overlying leads and pads. Distal NG tube tip may be in the distal esophagus, repeat image can be obtained. 2. Cardiomegaly with pulmonary venous congestion. Low lung volumes with mild bibasilar atelectasis. Electronically Signed   By: Marcello Moores  Register   On: 07/31/2018 12:11   Dg Abd Portable 1v  Result Date: 07/31/2018 CLINICAL DATA:  Orogastric tube placement. EXAM: PORTABLE ABDOMEN - 1 VIEW COMPARISON:  CT scan of July 29, 2018. FINDINGS: Moderate gastric  distention is noted. No large or small bowel dilatation is noted. Distal tip of orogastric tube is not well visualized as it may be at top of field-of-view. IMPRESSION: Moderate gastric distention. Distal tip of orogastric tube is not well visualized, but may be present at top of field of view. Repeat radiograph performed at higher level is recommended. Electronically Signed   By: Marijo Conception, M.D.   On: 07/31/2018 12:14     STUDIES:  04/2018 TTE: LVEF 60-65% grade 2 DD, mild LVH, LAE, RV midly dilated, RV systolic function reduced 7/31 V/Q scan negative   CULTURES: 7/30 Urine culture > 100k yeast 7/30 blood culture >   ANTIBIOTICS: 7/30 fluconazole >  7/30 zosyn x2 7/30 vanc >   SIGNIFICANT EVENTS: 8/2 2 minute cardiac arrest  LINES/TUBES: 8/2 ETT   DISCUSSION: 77 y/o male with multiple medical problems admitted for presumed sepsis (source uncertain) had a cardiac arrest on 8/2.  Suspect either PE or bradycardia.    ASSESSMENT / PLAN:  PULMONARY A: Acute respiratory failure with hypoxemia Left lower lobe atelectasis Doubt pneumonia P:   Full mechanical vent support VAP prevention Daily WUA/SBT CT angiogram chest now Continue prophylactic heparin for now  CARDIOVASCULAR A:  Cardiac arrest> suspect PE or bradycardia induced;  P:  Tele CT angiogram now If CT negative, then check Echo, consult cardiology Check Troponin, 12 lead EKG now  RENAL A:   AKI resolved P:   Monitor BMET and UOP Replace electrolytes as needed   GASTROINTESTINAL A:   No acute issues P:   Pepcid for stress ulcer prophylaxis  HEMATOLOGIC A:   Anemia of chronic disease Elevated d-dimer P:  Continue prophylactic lovenox  INFECTIOUS A:   Sepsis: UTI? Wound infection? Doubt pneumonia P:   Continue IV antibiotics for now, monitor cultures  ENDOCRINE A:   DM2   P:   SSI > change to q4h  NEUROLOGIC A:   No acute issues P:   RASS goal: -2 Precedex, fentanyl versed  per PAD protocol   FAMILY  - Updates: sisters updated bedside  - Inter-disciplinary family meet or Palliative Care meeting due by:  day 7  CC time 45 minutes  Roselie Awkward, MD [place] PCCM Pager: [phone] Cell: [phone] After 3pm or if no response, call [phone]   07/31/2018, 12:19 PM

## 2018-07-31 NOTE — Progress Notes (Signed)
Nutrition Follow-up  DOCUMENTATION CODES:   Morbid obesity  INTERVENTION:   If unable to extubate patient within the next 24 hours, recommend start TF:  Vital High Protein at 75 ml/h (1800 ml per day)  Pro-stat 30 ml TID  Provides 2100 kcal, 203 gm protein, 1505 ml free water daily  NUTRITION DIAGNOSIS:   Inadequate oral intake related to inability to eat as evidenced by NPO status.  Ongoing  GOAL:   Provide needs based on ASPEN/SCCM guidelines  Unmet  MONITOR:   Vent status, Labs, I & O's, Skin  REASON FOR ASSESSMENT:   Ventilator    ASSESSMENT:   77 y.o. Male who presented with weakness and lethargy on 7/30. PMH is significant for prostate adenocarcinoma, HFpEF (EF 60-65%), anemia, BLE lymphadema, T2DM, elephantiasis nostras verrucosa RLE, impaired mobility, morbid obesity, hypertension, HLD, GERD.  Patient developed acute respiratory failure after having cardiac arrest earlier today. Transferred to the ICU and intubated this morning.   Patient is currently intubated on ventilator support MV: 10.6 L/min Temp (24hrs), Avg:98.1 F (36.7 C), Min:97.5 F (36.4 C), Max:98.8 F (37.1 C)   Labs reviewed. CBG: 73 this morning Medications reviewed and include Novolog, Levemir, Flomax.   Palliative Care team following.  Diet Order:   Diet Order           Diet NPO time specified  Diet effective now          EDUCATION NEEDS:   No education needs have been identified at this time  Skin:  Skin Assessment: Skin Integrity Issues: Skin Integrity Issues:: Stage II Stage II: L thigh Other: MASD to thigh & sacrum  Last BM:  7/31  Height:   Ht Readings from Last 1 Encounters:  07/31/18 6' (1.829 m)    Weight:   Wt Readings from Last 1 Encounters:  07/30/18 (!) 331 lb 12.7 oz (150.5 kg)    Ideal Body Weight:  80.9 kg  BMI:  Body mass index is 45 kg/m.  Estimated Nutritional Needs:   Kcal:  0600-4599  Protein:  202 gm  Fluid:  2  L    Molli Barrows, RD, LDN, [place] Pager [phone] After Hours Pager [phone]

## 2018-08-01 LAB — BASIC METABOLIC PANEL
ANION GAP: 7 (ref 5–15)
BUN: 11 mg/dL (ref 8–23)
CO2: 29 mmol/L (ref 22–32)
Calcium: 8.3 mg/dL — ABNORMAL LOW (ref 8.9–10.3)
Chloride: 107 mmol/L (ref 98–111)
Creatinine, Ser: 0.64 mg/dL (ref 0.61–1.24)
GFR calc Af Amer: >60 mL/min (ref >60)
GLUCOSE: 119 mg/dL — AB (ref 70–99)
POTASSIUM: 4.1 mmol/L (ref 3.5–5.1)
Sodium: 143 mmol/L (ref 135–145)

## 2018-08-01 LAB — CBC
HEMATOCRIT: 29.2 % — AB (ref 39.0–52.0)
HEMOGLOBIN: 8.3 g/dL — AB (ref 13.0–17.0)
MCH: 25.4 pg — ABNORMAL LOW (ref 26.0–34.0)
MCHC: 28.4 g/dL — AB (ref 30.0–36.0)
MCV: 89.3 fL (ref 78.0–100.0)
Platelets: 279 10*3/uL (ref 150–400)
RBC: 3.27 MIL/uL — ABNORMAL LOW (ref 4.22–5.81)
RDW: 16.3 % — AB (ref 11.5–15.5)
WBC: 11.1 10*3/uL — ABNORMAL HIGH (ref 4.0–10.5)

## 2018-08-01 LAB — TROPONIN I
Troponin I: 0.07 ng/mL (ref <0.03)
Troponin I: 0.07 ng/mL (ref <0.03)

## 2018-08-01 LAB — GLUCOSE, CAPILLARY
GLUCOSE-CAPILLARY: 102 mg/dL — AB (ref 70–99)
GLUCOSE-CAPILLARY: 72 mg/dL (ref 70–99)
GLUCOSE-CAPILLARY: 92 mg/dL (ref 70–99)
Glucose-Capillary: 92 mg/dL (ref 70–99)
Glucose-Capillary: 97 mg/dL (ref 70–99)
Glucose-Capillary: 90 mg/dL (ref 70–99)
Glucose-Capillary: 94 mg/dL (ref 70–99)

## 2018-08-01 LAB — BLOOD GAS, ARTERIAL
ACID-BASE EXCESS: 5.1 mmol/L — AB (ref 0.0–2.0)
BICARBONATE: 30.8 mmol/L — AB (ref 20.0–28.0)
DELIVERY SYSTEMS: POSITIVE
DRAWN BY: 24513
EXPIRATORY PAP: 4
FIO2: 40
INSPIRATORY PAP: 18
Mode: POSITIVE
O2 SAT: 99 %
PO2 ART: 157 mmHg — AB (ref 83.0–108.0)
Patient temperature: 98.6
RATE: 14 resp/min
pCO2 arterial: 60.5 mmHg — ABNORMAL HIGH (ref 32.0–48.0)
pH, Arterial: 7.327 — ABNORMAL LOW (ref 7.350–7.450)

## 2018-08-01 MED ORDER — FUROSEMIDE 10 MG/ML IJ SOLN
40.0000 mg | Freq: Four times a day (QID) | INTRAMUSCULAR | Status: AC
  Administered 2018-08-01 (×2): 40 mg via INTRAVENOUS
  Filled 2018-08-01 (×2): qty 4

## 2018-08-01 MED ORDER — ENOXAPARIN SODIUM 40 MG/0.4ML ~~LOC~~ SOLN
40.0000 mg | SUBCUTANEOUS | Status: DC
  Administered 2018-08-01: 40 mg via SUBCUTANEOUS
  Filled 2018-08-01: qty 0.4

## 2018-08-01 MED ORDER — IPRATROPIUM-ALBUTEROL 0.5-2.5 (3) MG/3ML IN SOLN: 3.0000 mL | RESPIRATORY_TRACT | Status: DC | PRN

## 2018-08-01 NOTE — Progress Notes (Signed)
Patient taken off of bipap at 1525 and placed on 2L nasal cannula and is currently tolerating well.  Will continue to monitor.

## 2018-08-01 NOTE — Consult Note (Signed)
PULMONARY / CRITICAL CARE MEDICINE   Name: [name] MRN: [id number] DOB: 06-30-1941    ADMISSION DATE:  07/28/2018 CONSULTATION DATE:  07/31/2018  REFERRING MD:  Dr. McDiarmid  CHIEF COMPLAINT:  Cardiac arrest  HISTORY OF PRESENT ILLNESS:   77 y/o male with a history of osteomyelitis and diabetes and diastolic heart failure presented from a SNF on 7/30 with lethargy and was presumed to have sepsis in the ER based on a lactic acidosis and WBC greater than 20K.  He was also noted to have a new O2 requirement.  He had AKI on admission.  He was given IV antibiotics and fluids and his lab parameters improved.  Because of his increased O2 requirements and his elevated D-dimer he had a V/Q scan performed which was negative.  There were many possible causes of sepsis including possible pneumonia, wound infection and a UTI.  On the morning of 8/2 he was feeling well and was sitting up talking to the rounding team.  However later he got up with physical therapy and suddenly became incontinent of bowel and bladder and became unresponsive and bradycardic.  CPR was initiated and he received 2 minutes of chest compressions and 1 amp of epinephrine.  He recovered and was responsive but still hypoxemic and confused so he was electively intubated.  PCCM was consulted for transfer to the ICU.     SUBJECTIVE:  Pt w/ brief card arrest 8/2 , intubated and extubated,  Alert and following commands this am .  Cards consult felt PEA , rec echo  Remains on BIPAP support , unable to tolerate off due to increased wob , and hypoxia .    VITAL SIGNS: BP (!) 115/51   Pulse 92   Temp 97.8 F (36.6 C) (Oral)   Resp 14   Ht 6' (1.829 m)   Wt (!) 331 lb 12.7 oz (150.5 kg)   SpO2 100%   BMI 45.00 kg/m   HEMODYNAMICS:    VENTILATOR SETTINGS: Vent Mode: BIPAP FiO2 (%):  [40 %] 40 % Set Rate:  [10 bmp-18 bmp] 14 bmp Vt Set:  [620 mL] 620 mL PEEP:  [4 cmH20-5 cmH20] 4 cmH20 Pressure Support:  [5 cmH20] 5  cmH20 Plateau Pressure:  [20 IHW38-88 cmH20] 20 cmH20  INTAKE / OUTPUT: I/O last 3 completed shifts: In: 2401.7 [P.O.:350; I.V.:193; IV Piggyback:1858.7] Out: 800 [Urine:800]  PHYSICAL EXAMINATION:  General: Sitting up in bed on BiPAP HENT: NCAT, dry mucosa PULM: Decreased breath sounds in the bases no wheezing CV: RRR, positive systolic murmur GI: Obese, positive bowel sounds MSK: General deconditioning EXT : Significant venous insufficiency and stasis dermatitis changes of the lower extremities Neuro: Awake alert and following commands    LABS:  BMET Recent Labs  Lab 07/31/18 0419 07/31/18 1855 08/01/18 0117  NA 145 143 143  K 4.1 4.2 4.1  CL 110 105 107  CO2 29 23 29   BUN 20 13 11   CREATININE 0.77 0.74 0.64  GLUCOSE 80 151* 119*    Electrolytes Recent Labs  Lab 07/31/18 0419 07/31/18 1855 08/01/18 0117  CALCIUM 8.4* 8.5* 8.3*    CBC Recent Labs  Lab 07/31/18 0419 07/31/18 1855 08/01/18 0117  WBC 11.7* 11.0* 11.1*  HGB 8.9* 9.0* 8.3*  HCT 30.5* 31.2* 29.2*  PLT 294 178 279    Coag's Recent Labs  Lab 07/28/18 1713  INR 1.30    Sepsis Markers Recent Labs  Lab 07/28/18 1507 07/28/18 1718 07/31/18 1855  LATICACIDVEN 3.25* 1.74  1.2    ABG Recent Labs  Lab 07/31/18 2210 07/31/18 2348 08/01/18 0553  PHART 7.279* 7.282* 7.327*  PCO2ART 65.9* 65.1* 60.5*  PO2ART 156.0* 154.0* 157*    Liver Enzymes Recent Labs  Lab 07/28/18 1713  AST 41  ALT 18  ALKPHOS 115  BILITOT 1.5*  ALBUMIN 2.5*    Cardiac Enzymes Recent Labs  Lab 07/31/18 1855 08/01/18 0117 08/01/18 0726  TROPONINI 0.03* 0.07* 0.07*    Glucose Recent Labs  Lab 07/31/18 1529 07/31/18 1927 07/31/18 2336 08/01/18 0313 08/01/18 0721 08/01/18 1046  GLUCAP 156* 148* 102* 92 72 97    Imaging Ct Angio Chest Pe W Or Wo Contrast  Result Date: 07/31/2018 CLINICAL DATA:  Cardiac arrest EXAM: CT ANGIOGRAPHY CHEST WITH CONTRAST TECHNIQUE: Multidetector CT imaging  of the chest was performed using the standard protocol during bolus administration of intravenous contrast. Multiplanar CT image reconstructions and MIPs were obtained to evaluate the vascular anatomy. CONTRAST:  125mL ISOVUE-370 IOPAMIDOL (ISOVUE-370) INJECTION 76% COMPARISON:  Chest 07/31/2018, CT chest 05/19/2018 FINDINGS: Cardiovascular: Negative for pulmonary embolism. Heart size upper normal. Mild coronary calcification. Atherosclerotic thoracic aorta. Aorta not adequately opacified to evaluate for dissection. Negative for pericardial effusion Mediastinum/Nodes: Negative for mass or adenopathy. The patient is intubated. NG tip distal esophagus not entering the stomach. Lungs/Pleura: Small bilateral effusions with bibasilar atelectasis left greater than right. Upper lobes clear bilaterally. Upper Abdomen: No acute abnormality Musculoskeletal: Negative Review of the MIP images confirms the above findings. IMPRESSION: Negative for pulmonary embolism Mild coronary calcification Bilateral pleural effusions and bibasilar atelectasis NG tip in the distal esophagus.  Endotracheal tube in good position. Aortic Atherosclerosis (ICD10-I70.0). Electronically Signed   By: Franchot Gallo M.D.   On: 07/31/2018 14:37     STUDIES:  04/2018 TTE: LVEF 60-65% grade 2 DD, mild LVH, LAE, RV midly dilated, RV systolic function reduced 7/31 V/Q scan negative 8/2 CTA Neg PE , small bilateral effusions   CULTURES: 7/30 Urine culture > 100k yeast 7/30 blood culture >   ANTIBIOTICS: 7/30 fluconazole >  7/30 zosyn x2 7/30 vanc >   SIGNIFICANT EVENTS: 8/2 2 minute cardiac arrest  LINES/TUBES: 8/2 ETT >8/2   DISCUSSION: 77 y/o male with multiple medical problems admitted for presumed sepsis (source uncertain) had a cardiac arrest on 8/2.  Neg for PE . Possible secondary to PEA arrest.   ASSESSMENT / PLAN:  PULMONARY A: Acute respiratory failure with hypoxemia Left lower lobe atelectasis Doubt pneumonia P:    BIPAP support  Cont pulmicort neb  Add duoneb As needed     CARDIOVASCULAR A:  Cardiac arrest 8/2 > suspect PE or bradycardia induced;  Cards consult 8/2 , felt PEA arrest , no invasive eval indicated. Repeat echo suggested.  P:  Tele Check echo    RENAL A:   AKI resolved P:   Monitor BMET and UOP Replace electrolytes as needed   GASTROINTESTINAL A:   No apparent issues  P:   Pepcid for stress ulcer prophylaxis  HEMATOLOGIC A:   Anemia of chronic disease Elevated d-dimer>CTA neg for PE  P:  Would change back to Lovenox DVT prophylaxis   INFECTIOUS A:   Sepsis: UTI? Wound infection? Doubt pneumonia BC ngtd , Urine yeast .  P:   Continue IV antibiotics -Merrem  D/c Vanc and Diflucan   ENDOCRINE A:   DM2   Hypoglycemia  P:   SSI  D/c levemir for now    NEUROLOGIC A:   Alert post  arrest  P:   Avoid sedating rx as able    FAMILY  - Updates: pt updated 8/3   - Inter-disciplinary family meet or Palliative Care meeting due by:  day 7  Nikita Surman NP-C  [facility]  [phone]    08/01/2018, 12:16 PM

## 2018-08-01 NOTE — Progress Notes (Signed)
Patient was taken off of bipap at 09:50 AM by RN and placed on 2L nasal cannula and is currently tolerating well.  Will continue to monitor.

## 2018-08-01 NOTE — Progress Notes (Signed)
Patient placed back on bipap due to RN having to lay patient flat and patient was not able to tolerate laying flat.  Patient currently is tolerating well.  Will continue to monitor.

## 2018-08-01 NOTE — Progress Notes (Signed)
Spoke to Dr. Lake Bells who had just finished a  [place] meeting with pt, pt's son and daughter. At this point, after to speaking with Dr. Lake Bells, palliative medicine to sign off and CCM will re consult if goals change. Thank you, Romona Curls, ANP

## 2018-08-01 NOTE — Progress Notes (Signed)
Pt unable to tolerate w/o BIPAP, decompensated quciklly w/ HOB < 30 degrees. Became increasingly more tachypneic even before position change; RX and MD made aware of inability to give POs.

## 2018-08-02 ENCOUNTER — Inpatient Hospital Stay (HOSPITAL_COMMUNITY): Payer: PPO

## 2018-08-02 DIAGNOSIS — A021 Salmonella sepsis: Secondary | ICD-10-CM

## 2018-08-02 LAB — CBC
HEMATOCRIT: 31.9 % — AB (ref 39.0–52.0)
HEMOGLOBIN: 9.1 g/dL — AB (ref 13.0–17.0)
MCH: 25.3 pg — ABNORMAL LOW (ref 26.0–34.0)
MCHC: 28.5 g/dL — ABNORMAL LOW (ref 30.0–36.0)
MCV: 88.9 fL (ref 78.0–100.0)
Platelets: 344 10*3/uL (ref 150–400)
RBC: 3.59 MIL/uL — AB (ref 4.22–5.81)
RDW: 16.2 % — AB (ref 11.5–15.5)
WBC: 10.5 10*3/uL (ref 4.0–10.5)

## 2018-08-02 LAB — GLUCOSE, CAPILLARY
GLUCOSE-CAPILLARY: 86 mg/dL (ref 70–99)
Glucose-Capillary: 121 mg/dL — ABNORMAL HIGH (ref 70–99)
Glucose-Capillary: 123 mg/dL — ABNORMAL HIGH (ref 70–99)
Glucose-Capillary: 134 mg/dL — ABNORMAL HIGH (ref 70–99)

## 2018-08-02 LAB — CULTURE, BLOOD (ROUTINE X 2)
Culture: NO GROWTH
Culture: NO GROWTH
SPECIAL REQUESTS: ADEQUATE
Special Requests: ADEQUATE

## 2018-08-02 LAB — BASIC METABOLIC PANEL
ANION GAP: 10 (ref 5–15)
BUN: 7 mg/dL — ABNORMAL LOW (ref 8–23)
CALCIUM: 8.7 mg/dL — AB (ref 8.9–10.3)
CO2: 33 mmol/L — AB (ref 22–32)
Chloride: 101 mmol/L (ref 98–111)
Creatinine, Ser: 0.7 mg/dL (ref 0.61–1.24)
GFR calc non Af Amer: >60 mL/min (ref >60)
Glucose, Bld: 91 mg/dL (ref 70–99)
Potassium: 3.9 mmol/L (ref 3.5–5.1)
Sodium: 144 mmol/L (ref 135–145)

## 2018-08-02 LAB — PHOSPHORUS: PHOSPHORUS: 2.5 mg/dL (ref 2.5–4.6)

## 2018-08-02 LAB — ECHOCARDIOGRAM COMPLETE
Height: 72 in
Weight: 5308.68 oz

## 2018-08-02 LAB — MAGNESIUM: Magnesium: 1.5 mg/dL — ABNORMAL LOW (ref 1.7–2.4)

## 2018-08-02 MED ORDER — FUROSEMIDE 10 MG/ML IJ SOLN
40.0000 mg | Freq: Four times a day (QID) | INTRAMUSCULAR | Status: AC
  Administered 2018-08-02 (×2): 40 mg via INTRAVENOUS
  Filled 2018-08-02 (×2): qty 4

## 2018-08-02 MED ORDER — ENOXAPARIN SODIUM 80 MG/0.8ML ~~LOC~~ SOLN
75.0000 mg | SUBCUTANEOUS | Status: DC
  Administered 2018-08-02 – 2018-08-06 (×5): 75 mg via SUBCUTANEOUS
  Filled 2018-08-02 (×2): qty 0.8
  Filled 2018-08-02: qty 0.75
  Filled 2018-08-02: qty 0.8
  Filled 2018-08-02: qty 0.75
  Filled 2018-08-02: qty 0.8

## 2018-08-02 NOTE — Progress Notes (Signed)
  Brief summary of ICU course  S: Pt was transferred to CCM on 8/2 for sudden cardiac arrest lasting 2 minutes before ROSC. Patient intubated and on ventilation for hypoxemia and AMS. Initial significant labs performed: -troponins 0.03 -ABG 1 hour s/p arrest: pH 7.191, pCO2 73.4 -lactic acid 1.2 Significant imaging performed: -CXRAY- cardiomegaly with pulmonary venous congestion, low lung volumes, mild bibasilar atelectasis -CT angio chest- negative for PE, bilateral pleural effusions, bibasilar atelectasis Patient was extubated and on BiPAP x2 days. Patient was taken off BiPAP and started on 4L [place] with poor toleration x1 day. Pt weaned to 2L [place] on 8/4 and tolerated well. Patient remained hemodynamically stable throughout stay. Cardiology consult amd had concern for bradycardia with possible VT on telemetry.  Dietary consult made diet recommendations. PT consult made recommendations to discharge to SNF at discharge. Wound care consult made recommendations. Palliative care consult spoke to patient and the service was denied at this time. Etiology of cardiac arrest still uncertain. Pending echo results with suspect of pulmonary HTN. We will resume primary care on 8/5.  O: BP 135/62   Pulse 87   Temp 98.1 F (36.7 C) (Oral)   Resp 19   Ht 6' (1.829 m)   Wt (!) 331 lb 12.7 oz (150.5 kg)   SpO2 96%   BMI 45.00 kg/m     Richarda Osmond, DO 08/02/2018, 2:39 PM PGY-1, [facility] pager [phone]

## 2018-08-02 NOTE — Progress Notes (Signed)
  Echocardiogram 2D Echocardiogram has been performed.  [name] 08/02/2018, 1:54 PM

## 2018-08-02 NOTE — Progress Notes (Signed)
PULMONARY / CRITICAL CARE MEDICINE   Name: [name] MRN: [id number] DOB: 31-May-1941    ADMISSION DATE:  07/28/2018 CONSULTATION DATE:  07/31/2018  REFERRING MD:  Dr. McDiarmid  CHIEF COMPLAINT:  Cardiac arrest  HISTORY OF PRESENT ILLNESS:   77 y/o male with a history of osteomyelitis and diabetes and diastolic heart failure presented from a SNF on 7/30 with lethargy and was presumed to have sepsis in the ER based on a lactic acidosis and WBC greater than 20K.  He was also noted to have a new O2 requirement.  He had AKI on admission.  He was given IV antibiotics and fluids and his lab parameters improved.  Because of his increased O2 requirements and his elevated D-dimer he had a V/Q scan performed which was negative.  There were many possible causes of sepsis including possible pneumonia, wound infection and a UTI.  On the morning of 8/2 he was feeling well and was sitting up talking to the rounding team.  However later he got up with physical therapy and suddenly became incontinent of bowel and bladder and became unresponsive and bradycardic.  CPR was initiated and he received 2 minutes of chest compressions and 1 amp of epinephrine.  He recovered and was responsive but still hypoxemic and confused so he was electively intubated.  PCCM was consulted for transfer to the ICU.     SUBJECTIVE:  Diuresed well On 2L [place] Off BIPAP Says his appetite is better    VITAL SIGNS: BP 114/60 (BP Location: Left Arm)   Pulse 88   Temp 97.8 F (36.6 C) (Oral)   Resp 15   Ht 6' (1.829 m)   Wt (!) 150.5 kg (331 lb 12.7 oz)   SpO2 98%   BMI 45.00 kg/m   HEMODYNAMICS:    VENTILATOR SETTINGS: Vent Mode: BIPAP FiO2 (%):  [40 %] 40 % Set Rate:  [14 bmp] 14 bmp PEEP:  [4 cmH20] 4 cmH20  INTAKE / OUTPUT: I/O last 3 completed shifts: In: 1750.1 [I.V.:250; IV Piggyback:1500.1] Out: 2125 [Urine:2125]  PHYSICAL EXAMINATION:  General:  Resting comfortably in bed HENT: NCAT OP clear PULM:  Crackles bases B, normal effort CV: RRR, no mgr GI: BS+, soft, nontender Derm: leg edema/dressings noted Neuro: awake, alert, no distress, MAEW   LABS:  BMET Recent Labs  Lab 07/31/18 0419 07/31/18 1855 08/01/18 0117  NA 145 143 143  K 4.1 4.2 4.1  CL 110 105 107  CO2 29 23 29   BUN 20 13 11   CREATININE 0.77 0.74 0.64  GLUCOSE 80 151* 119*    Electrolytes Recent Labs  Lab 07/31/18 0419 07/31/18 1855 08/01/18 0117  CALCIUM 8.4* 8.5* 8.3*    CBC Recent Labs  Lab 07/31/18 1855 08/01/18 0117 08/02/18 0605  WBC 11.0* 11.1* 10.5  HGB 9.0* 8.3* 9.1*  HCT 31.2* 29.2* 31.9*  PLT 178 279 344    Coag's Recent Labs  Lab 07/28/18 1713  INR 1.30    Sepsis Markers Recent Labs  Lab 07/28/18 1507 07/28/18 1718 07/31/18 1855  LATICACIDVEN 3.25* 1.74 1.2    ABG Recent Labs  Lab 07/31/18 2210 07/31/18 2348 08/01/18 0553  PHART 7.279* 7.282* 7.327*  PCO2ART 65.9* 65.1* 60.5*  PO2ART 156.0* 154.0* 157*    Liver Enzymes Recent Labs  Lab 07/28/18 1713  AST 41  ALT 18  ALKPHOS 115  BILITOT 1.5*  ALBUMIN 2.5*    Cardiac Enzymes Recent Labs  Lab 07/31/18 1855 08/01/18 0117 08/01/18 0726  TROPONINI  0.03* 0.07* 0.07*    Glucose Recent Labs  Lab 08/01/18 0721 08/01/18 1046 08/01/18 1522 08/01/18 1954 08/01/18 2223 08/02/18 0715  GLUCAP 72 97 90 92 94 86    Imaging Dg Chest Port 1 View  Result Date: 08/02/2018 CLINICAL DATA:  Respiratory failure. EXAM: PORTABLE CHEST 1 VIEW COMPARISON:  07/31/2018 FINDINGS: Endotracheal and enteric tubes have been removed. The cardiac silhouette is mildly enlarged. There is persistent pulmonary vascular congestion with increasing perihilar and basilar opacities bilaterally. Small bilateral pleural effusions persist. No pneumothorax is identified. IMPRESSION: 1. Interval extubation. 2. Small bilateral pleural effusions with increasing perihilar and basilar opacities likely reflecting worsening edema and  atelectasis. Electronically Signed   By: Logan Bores M.D.   On: 08/02/2018 07:18     STUDIES:  04/2018 TTE: LVEF 60-65% grade 2 DD, mild LVH, LAE, RV midly dilated, RV systolic function reduced 7/31 V/Q scan negative 8/2 CTA Neg PE , small bilateral effusions   CULTURES: 7/30 Urine culture > 100k yeast 7/30 blood culture >   ANTIBIOTICS: 7/30 fluconazole > 8/3 7/30 zosyn x2 7/31 meropenem > 8/4 7/30 vanc > 8/3  SIGNIFICANT EVENTS: 8/2 2 minute cardiac arrest, extubated  LINES/TUBES: 8/2 ETT >8/2   DISCUSSION: 77 y/o male with multiple medical problems admitted for presumed sepsis (source uncertain) had a cardiac arrest on 8/2.  Neg for PE . Possible secondary to PEA arrest but etiology uncertain. Had heart failure 8/4  ASSESSMENT / PLAN:  PULMONARY A: Acute respiratory failure with hypoxemia due to acute pulmonary edema Left lower lobe atelectasis Doubt pneumonia Suspect obesity hypoventilation syndrome P:   Continue diuresis today Continue O2 to maintain O2 saturation 90% CPAP qHS, needs outpatient sleep study  CARDIOVASCULAR A:  Cardiac arrest 8/2 > suspect PE or bradycardia induced;  Cards consult 8/2 , felt PEA arrest, uncertain etiology, no invasive eval indicated. Repeat echo suggested.  Acute diastolic heart failure ? Pulmonary hypertension P:  Tele Repeat echo today Continue diuresis Cardiac diet   RENAL A:   AKI resolved P:   Monitor BMET and UOP Replace electrolytes as needed  GASTROINTESTINAL A:   No acute issues P:   pepcid for stress ulcer prophylaxis Advance diet  HEMATOLOGIC A:   Anemia of chronic disease Elevated d-dimer>CTA neg for PE  P:  lovenox for dvt prevention  INFECTIOUS A:   Sepsis: UTI? Wound infection? > no clear evidence at this point, has received 5 days of antibiotics Doubt pneumonia BC ngtd , Urine yeast Chronic wounds legs .  P:   Stop antibiotics and monitor for fever, WBC Continue wound  care  ENDOCRINE A:   DM2   Hypoglycemia  P:   SSI Monitor glucose   NEUROLOGIC A:   Alert post arrest  P:   Avoid sedating medicines   FAMILY  - Updates: updated family 8/3 and 8/4  - Inter-disciplinary family meet or Palliative Care meeting due by:  day 7  Roselie Awkward, MD [place] Pager: [phone] Cell: [phone] After 3pm or if no response, call [phone]   08/02/2018, 8:50 AM

## 2018-08-02 NOTE — Progress Notes (Signed)
RT note: patient resting comfortably on 2L nasal cannula this AM with no distress.  Bipap currently not indicated at this time.  Will continue to monitor and assess for bipap needs.

## 2018-08-02 NOTE — Progress Notes (Signed)
FPTS Social Note  Patient is continuity patient at [facility]. Following along and will resume care when patient is stable for transfer out of ICU. Appreciate excellent care from CCM.   Bufford Lope, DO 08/02/2018, 7:42 AM PGY-3, [facility] Service pager [phone]

## 2018-08-03 ENCOUNTER — Inpatient Hospital Stay (HOSPITAL_COMMUNITY): Payer: PPO

## 2018-08-03 DIAGNOSIS — R601 Generalized edema: Secondary | ICD-10-CM

## 2018-08-03 DIAGNOSIS — Z9989 Dependence on other enabling machines and devices: Secondary | ICD-10-CM

## 2018-08-03 DIAGNOSIS — B49 Unspecified mycosis: Secondary | ICD-10-CM | POA: Diagnosis present

## 2018-08-03 DIAGNOSIS — G4733 Obstructive sleep apnea (adult) (pediatric): Secondary | ICD-10-CM

## 2018-08-03 DIAGNOSIS — J810 Acute pulmonary edema: Secondary | ICD-10-CM

## 2018-08-03 LAB — BASIC METABOLIC PANEL
Anion gap: 6 (ref 5–15)
BUN: 5 mg/dL — AB (ref 8–23)
CHLORIDE: 98 mmol/L (ref 98–111)
CO2: 39 mmol/L — ABNORMAL HIGH (ref 22–32)
CREATININE: 0.67 mg/dL (ref 0.61–1.24)
Calcium: 8.3 mg/dL — ABNORMAL LOW (ref 8.9–10.3)
GFR calc Af Amer: >60 mL/min (ref >60)
GFR calc non Af Amer: >60 mL/min (ref >60)
GLUCOSE: 175 mg/dL — AB (ref 70–99)
POTASSIUM: 3.4 mmol/L — AB (ref 3.5–5.1)
SODIUM: 143 mmol/L (ref 135–145)

## 2018-08-03 LAB — GLUCOSE, CAPILLARY
GLUCOSE-CAPILLARY: 147 mg/dL — AB (ref 70–99)
GLUCOSE-CAPILLARY: 171 mg/dL — AB (ref 70–99)
Glucose-Capillary: 150 mg/dL — ABNORMAL HIGH (ref 70–99)
Glucose-Capillary: 152 mg/dL — ABNORMAL HIGH (ref 70–99)

## 2018-08-03 LAB — C DIFFICILE QUICK SCREEN W PCR REFLEX
C DIFFICILE (CDIFF) TOXIN: NEGATIVE
C DIFFICLE (CDIFF) ANTIGEN: NEGATIVE
C Diff interpretation: NOT DETECTED

## 2018-08-03 MED ORDER — ENSURE ENLIVE PO LIQD
237.0000 mL | Freq: Two times a day (BID) | ORAL | Status: DC
  Administered 2018-08-03 – 2018-08-07 (×3): 237 mL via ORAL

## 2018-08-03 MED ORDER — METOPROLOL TARTRATE 12.5 MG HALF TABLET
12.5000 mg | ORAL_TABLET | Freq: Two times a day (BID) | ORAL | Status: DC
  Administered 2018-08-03 – 2018-08-07 (×9): 12.5 mg via ORAL
  Filled 2018-08-03 (×9): qty 1

## 2018-08-03 MED ORDER — POTASSIUM CHLORIDE CRYS ER 20 MEQ PO TBCR
40.0000 meq | EXTENDED_RELEASE_TABLET | Freq: Three times a day (TID) | ORAL | Status: DC
  Administered 2018-08-03: 40 meq via ORAL
  Filled 2018-08-03: qty 2

## 2018-08-03 MED ORDER — MAGNESIUM SULFATE 2 GM/50ML IV SOLN
2.0000 g | Freq: Once | INTRAVENOUS | Status: AC
  Administered 2018-08-03: 2 g via INTRAVENOUS
  Filled 2018-08-03: qty 50

## 2018-08-03 MED ORDER — FUROSEMIDE 10 MG/ML IJ SOLN
40.0000 mg | Freq: Three times a day (TID) | INTRAMUSCULAR | Status: AC
  Administered 2018-08-03 (×2): 40 mg via INTRAVENOUS
  Filled 2018-08-03 (×2): qty 4

## 2018-08-03 NOTE — Progress Notes (Signed)
PULMONARY / CRITICAL CARE MEDICINE   Name: [name] MRN: [id number] DOB: 12-09-1941    ADMISSION DATE:  07/28/2018 CONSULTATION DATE:  07/31/2018  REFERRING MD:  Dr. McDiarmid  CHIEF COMPLAINT:  Cardiac arrest  HISTORY OF PRESENT ILLNESS:   77 y/o male with a history of osteomyelitis and diabetes and diastolic heart failure presented from a SNF on 7/30 with lethargy and was presumed to have sepsis in the ER based on a lactic acidosis and WBC greater than 20K.  He was also noted to have a new O2 requirement.  He had AKI on admission.  He was given IV antibiotics and fluids and his lab parameters improved.  Because of his increased O2 requirements and his elevated D-dimer he had a V/Q scan performed which was negative.  There were many possible causes of sepsis including possible pneumonia, wound infection and a UTI.  On the morning of 8/2 he was feeling well and was sitting up talking to the rounding team.  However later he got up with physical therapy and suddenly became incontinent of bowel and bladder and became unresponsive and bradycardic.  CPR was initiated and he received 2 minutes of chest compressions and 1 amp of epinephrine.  He recovered and was responsive but still hypoxemic and confused so he was electively intubated.  PCCM was consulted for transfer to the ICU.   SUBJECTIVE:  No events overnight, no new complaints  VITAL SIGNS: BP (!) 108/57   Pulse 88   Temp 98.1 F (36.7 C) (Oral)   Resp 18   Ht 6' (1.829 m)   Wt (!) 150.5 kg (331 lb 12.7 oz)   SpO2 100%   BMI 45.00 kg/m   HEMODYNAMICS:    VENTILATOR SETTINGS:    INTAKE / OUTPUT: I/O last 3 completed shifts: In: 1040 [P.O.:580; I.V.:360; IV Piggyback:100] Out: 7341 [Urine:4175]  PHYSICAL EXAMINATION:  General:  Chronically ill appearing male, NAD HENT: [place]/AT, PERRL, EOM-I and MMM PULM: Bibasilar crackles noted CV: RRR, Nl S1/S2 and -M/R/G GI: Soft, NT, ND and +BS Derm: leg edema/dressings  noted Neuro: awake, alert, no distress, MAEW Ext: 2+ edema and -tenderness  LABS:  BMET Recent Labs  Lab 08/01/18 0117 08/02/18 0605 08/03/18 0328  NA 143 144 143  K 4.1 3.9 3.4*  CL 107 101 98  CO2 29 33* 39*  BUN 11 7* 5*  CREATININE 0.64 0.70 0.67  GLUCOSE 119* 91 175*   Electrolytes Recent Labs  Lab 08/01/18 0117 08/02/18 0605 08/03/18 0328  CALCIUM 8.3* 8.7* 8.3*  MG  --  1.5*  --   PHOS  --  2.5  --    CBC Recent Labs  Lab 07/31/18 1855 08/01/18 0117 08/02/18 0605  WBC 11.0* 11.1* 10.5  HGB 9.0* 8.3* 9.1*  HCT 31.2* 29.2* 31.9*  PLT 178 279 344   Coag's Recent Labs  Lab 07/28/18 1713  INR 1.30    Sepsis Markers Recent Labs  Lab 07/28/18 1507 07/28/18 1718 07/31/18 1855  LATICACIDVEN 3.25* 1.74 1.2    ABG Recent Labs  Lab 07/31/18 2210 07/31/18 2348 08/01/18 0553  PHART 7.279* 7.282* 7.327*  PCO2ART 65.9* 65.1* 60.5*  PO2ART 156.0* 154.0* 157*   Liver Enzymes Recent Labs  Lab 07/28/18 1713  AST 41  ALT 18  ALKPHOS 115  BILITOT 1.5*  ALBUMIN 2.5*   Cardiac Enzymes Recent Labs  Lab 07/31/18 1855 08/01/18 0117 08/01/18 0726  TROPONINI 0.03* 0.07* 0.07*   Glucose Recent Labs  Lab 08/01/18  2223 08/02/18 0715 08/02/18 1127 08/02/18 1622 08/02/18 2304 08/03/18 0721  GLUCAP 94 86 121* 123* 134* 150*   Imaging Dg Chest Port 1 View  Result Date: 08/03/2018 CLINICAL DATA:  Hypoxia EXAM: PORTABLE CHEST 1 VIEW COMPARISON:  August 02, 2018 FINDINGS: There is airspace consolidation in both lower lobes, somewhat more on the left than on the right with pleural effusions bilaterally, larger on the left than on the right. Heart is borderline enlarged with pulmonary vascularity normal. No adenopathy evident. There is arthropathy in the shoulders with superior migration of the left humeral head and remodeling of the left humeral head. IMPRESSION: Consolidation in both lung bases, likely enlarged part due to atelectasis, although  superimposed pneumonia in the bases must be of concern. There are pleural effusions in the bases, larger on the left than on the right. There is stable cardiomegaly. 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 Electronically Signed   By: Lowella Grip III M.D.   On: 08/03/2018 07:09   STUDIES:  04/2018 TTE: LVEF 60-65% grade 2 DD, mild LVH, LAE, RV midly dilated, RV systolic function reduced 7/31 V/Q scan negative 8/2 CTA Neg PE , small bilateral effusions  CULTURES: 7/30 Urine culture > 100k yeast 7/30 blood culture >   ANTIBIOTICS: 7/30 fluconazole > 8/3 7/30 zosyn x2 7/31 meropenem > 8/4 7/30 vanc > 8/3  SIGNIFICANT EVENTS: 8/2 2 minute cardiac arrest, extubated  LINES/TUBES: 8/2 ETT >8/2   I reviewed CXR myself, pulmonary edema noted  DISCUSSION: 77 y/o male with multiple medical problems admitted for presumed sepsis (source uncertain) had a cardiac arrest on 8/2.  Neg for PE . Possible secondary to PEA arrest but etiology uncertain. Had heart failure 8/4  ASSESSMENT / PLAN:  PULMONARY A: Acute respiratory failure with hypoxemia due to acute pulmonary edema Left lower lobe atelectasis Doubt pneumonia Suspect obesity hypoventilation syndrome P:   Gentle diureses today Titrate O2 for sat of 88-92% CPAP at night, will need a sleep study as outpatient  CARDIOVASCULAR A:  Cardiac arrest 8/2 > suspect PE or bradycardia induced;  Cards consult 8/2 , felt PEA arrest, uncertain etiology, no invasive eval indicated. Repeat echo suggested.  Acute diastolic heart failure ? Pulmonary hypertension P:  Tele monitoring Echo with significant reduction in EF, start low dose beta blockers today Continue diuresis Cardiac diet   RENAL A:   AKI resolved P:   Monitor BMET and UOP Replace electrolytes as needed Lasix 40 mg IV x2  doses  GASTROINTESTINAL A:   No acute issues P:   Pepcid Diet as ordered  HEMATOLOGIC A:   Anemia of chronic disease Elevated d-dimer>CTA neg for PE  P:  Lovenox for dvt prevention  INFECTIOUS A:   Sepsis: UTI? Wound infection? > no clear evidence at this point, has received 5 days of antibiotics Doubt pneumonia BC ngtd , Urine yeast Chronic wounds legs .  P:   Stop antibiotics and monitor for fever, WBC Continue wound care  ENDOCRINE A:   DM2   Hypoglycemia  P:   CBG SSI Monitor glucose  NEUROLOGIC A:   Alert post arrest  P:   Avoid sedating medicines  FAMILY  - Updates: Patient updated bedside, transfer to tele and back to FPTS with PCCM off 8/6.  - Inter-disciplinary family meet or Palliative Care meeting due by:  day 7  Discussed with [place] resident and  bedside RN  Rush Farmer, M.D. [facility] Pulmonary/Critical Care Medicine. Pager: [phone]. After hours pager: [phone].  08/03/2018, 9:32 AM

## 2018-08-03 NOTE — Progress Notes (Signed)
Progress Note  Patient Name: [name] Date of Encounter: 08/03/2018  Primary Cardiologist:  Joel Dresser, MD   Subjective   [name] is a 77 year old gentleman with a history of diabetes, chronic diastolic congestive heart failure who we originally saw in cardiac consultation on August 2 following a a PEA cardiac arrest.  The patient had been admitted with sepsis, respiratory failure with acidosis, pneumonia  The patient was seen by [name] following his PEA arrest.  He remembers becoming very short of breath.  He remembers trying to wait for someone to get their attention but then passed out. [name] reviewed the telemetry monitors in great detail and did not find anything to cause an episode of syncope.  He did have some nonsustained ventricular tachycardia during the code.  The patient remains quite weak. Echocardiogram performed yesterday reveals a fall and his ejection fraction With a current ejection fraction of 30 to 35%. Echocardiogram on May 21, 2018 reveals normal left ventricular systolic function with an EF of 60 to 65% with grade 2 diastolic dysfunction.    Inpatient Medications    Scheduled Meds: . budesonide (PULMICORT) nebulizer solution  0.5 mg Nebulization BID  . chlorhexidine  15 mL Mouth Rinse BID  . enoxaparin (LOVENOX) injection  75 mg Subcutaneous Q24H  . feeding supplement (ENSURE ENLIVE)  237 mL Oral BID BM  . furosemide  40 mg Intravenous Q8H  . Gerhardt's butt cream   Topical TID  . insulin aspart  0-15 Units Subcutaneous TID WC  . mouth rinse  15 mL Mouth Rinse q12n4p  . metoprolol tartrate  12.5 mg Oral BID  . nystatin   Topical BID  . potassium chloride  40 mEq Oral TID  . pravastatin  40 mg Oral Daily  . tamsulosin  0.8 mg Oral q morning - 10a  . urea   Topical BID   Continuous Infusions: . sodium chloride Stopped (08/03/18 0747)   PRN Meds: acetaminophen **OR** acetaminophen, ipratropium-albuterol   Vital  Signs    Vitals:   08/03/18 0818 08/03/18 1035 08/03/18 1100 08/03/18 1120  BP:  (!) 151/74  (!) 141/76  Pulse:  92 94 93  Resp:  (!) 21 (!) 22 20  Temp:      TempSrc:      SpO2: 100% 95% 100% 98%  Weight:      Height:        Intake/Output Summary (Last 24 hours) at 08/03/2018 1447 Last data filed at 08/03/2018 1200 Gross per 24 hour  Intake 559.96 ml  Output 1175 ml  Net -615.04 ml   Filed Weights   07/28/18 2038 07/29/18 0602 07/30/18 7622  Weight: (!) 331 lb 12.7 oz (150.5 kg) (!) 331 lb 12.7 oz (150.5 kg) (!) 331 lb 12.7 oz (150.5 kg)    Telemetry    NSR  - Personally Reviewed  ECG     NSR  - Personally Reviewed  Physical Exam    GEN:  Elderly gentleman, appears to be generally weak..   Neck: No JVD Cardiac: RRR, 2/6 systolic murmur radiating to the upper right sternal border. Respiratory: Clear to auscultation bilaterally. GI: Soft, nontender, non-distended  MS: No edema; No deformity. Neuro:  Nonfocal  Psych: Normal affect   Labs    Chemistry Recent Labs  Lab 07/28/18 1713  08/01/18 0117 08/02/18 0605 08/03/18 0328  NA 137   < > 143 144 143  K 4.7   < > 4.1 3.9 3.4*  CL  99   < > 107 101 98  CO2 22   < > 29 33* 39*  GLUCOSE 204*   < > 119* 91 175*  BUN 60*   < > 11 7* 5*  CREATININE 2.45*   < > 0.64 0.70 0.67  CALCIUM 8.8*   < > 8.3* 8.7* 8.3*  PROT 8.1  --   --   --   --   ALBUMIN 2.5*  --   --   --   --   AST 41  --   --   --   --   ALT 18  --   --   --   --   ALKPHOS 115  --   --   --   --   BILITOT 1.5*  --   --   --   --   GFRNONAA 24*   < > >60 >60 >60  GFRAA 27*   < > >60 >60 >60  ANIONGAP 16*   < > 7 10 6    < > = values in this interval not displayed.     Hematology Recent Labs  Lab 07/31/18 1855 08/01/18 0117 08/02/18 0605  WBC 11.0* 11.1* 10.5  RBC 3.51* 3.27* 3.59*  HGB 9.0* 8.3* 9.1*  HCT 31.2* 29.2* 31.9*  MCV 88.9 89.3 88.9  MCH 25.6* 25.4* 25.3*  MCHC 28.8* 28.4* 28.5*  RDW 16.3* 16.3* 16.2*  PLT 178 279 344     Cardiac Enzymes Recent Labs  Lab 07/31/18 1855 08/01/18 0117 08/01/18 0726  TROPONINI 0.03* 0.07* 0.07*    Recent Labs  Lab 07/28/18 1432 07/28/18 1446  TROPIPOC 0.00 0.00     BNP Recent Labs  Lab 07/28/18 1713  BNP 61.6     DDimer  Recent Labs  Lab 07/29/18 1126  DDIMER 3.86*     Radiology    Dg Chest Port 1 View  Result Date: 08/03/2018 CLINICAL DATA:  Hypoxia EXAM: PORTABLE CHEST 1 VIEW COMPARISON:  August 02, 2018 FINDINGS: There is airspace consolidation in both lower lobes, somewhat more on the left than on the right with pleural effusions bilaterally, larger on the left than on the right. Heart is borderline enlarged with pulmonary vascularity normal. No adenopathy evident. There is arthropathy in the shoulders with superior migration of the left humeral head and remodeling of the left humeral head. IMPRESSION: Consolidation in both lung bases, likely enlarged part due to atelectasis, although superimposed pneumonia in the bases must be of concern. There are pleural effusions in the bases, larger on the left than on the right. There is stable cardiomegaly. 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 Electronically Signed   By: [name] III M.D.   On: 08/03/2018 07:09   Dg Chest Port 1 View  Result Date: 08/02/2018 CLINICAL DATA:  Respiratory failure. EXAM: PORTABLE CHEST 1 VIEW COMPARISON:  07/31/2018 FINDINGS: Endotracheal and enteric tubes have been removed. The cardiac silhouette is mildly enlarged. There is persistent pulmonary vascular congestion with increasing perihilar and basilar opacities bilaterally. Small bilateral pleural effusions persist. No pneumothorax is identified. IMPRESSION: 1. Interval extubation. 2. Small bilateral pleural effusions with increasing perihilar and basilar opacities likely reflecting worsening edema and  atelectasis. Electronically Signed   By: [name] M.D.   On: 08/02/2018 07:18    Cardiac Studies     Patient Profile     77  y.o. male with a history of acute on chronic diastolic congestive heart failure, osteomyelitis, diabetes.  He presented with respiratory distress with lactic acidosis and an elevated white blood cell count.  He was presumed to have pneumonia and sepsis.  He had a PEA arrest on admission. Follow-up echocardiogram reveals reduction in left ventricular systolic function compared to his previous echocardiogram in May.  We are asked to see him again today for to comment on this reduction of LV function.  Assessment & Plan    1.  Acute on chronic combined systolic and diastolic congestive heart failure: The patient's ejection fraction has fallen since his previous echocardiogram in May.  His troponin levels following his admission and PEA arrest were negative for acute coronary syndrome.  I suspect that his reduced left ventricular systolic function is related to sepsis/sepsis syndrome.  He is not having any angina.  He is extremely weak and is a very poor candidate for heart catheterization at this time.  He has had several recent episodes of acute renal insufficiency although his creatinine has largely returned to normal at this point.    At this point I would hold off on doing any additional ischemic evaluation.  My suspicion is that his left ventricular function will improve as he generally improves from a sepsis standpoint.  He is not a good candidate for heart catheterization at this point given his generalized weakness and multiple medical issues 1 of which is recent acute renal insufficiency.  I would allow him to heal up and then he can see [name] in  the office and then a decision can be made at that point as to what the appropriate work-up would be at that time.  [facility] will sign off.   Medication Recommendations:  Continue supportive care    Other recommendations (labs, testing, etc):   Follow up as an outpatient:  With [name] at the [facility] office.   For questions or updates, please contact [place] [place] Please consult [email or web address] for contact info under Cardiology/STEMI.      Signed, Mertie Moores, MD  08/03/2018, 2:47 PM

## 2018-08-03 NOTE — Progress Notes (Signed)
Occupational Therapy Treatment Patient Details Name: [name] MRN: [id number] DOB: [birth year] Today's Date: 08/03/2018    History of present illness 77 yo male who presented with weakness and lethargy.  PMH is significant for prostate adenocarcinoma, HFpEF (EF 60-65%), anemia, BLE lymphadema, T2DM, elephantiasis nostras verrucosa RLE, impaired mobility, morbid obesity, HTN, HLD, GERD. Admitted with sepsis, respiratory acidosis and AKI. On 8/2 pt became unresponsive and code blue was initiated at end of PT session.    OT comments  Pt seen with PT.  Pt only agreeable to EOB activity this date. He required max A +2 to move to EOB and mod A to maintain EOB sitting.   Upon return to supine, 02 sats decreased to 69% and required 4L supplemental 02 to return to mid to upper 90s.  Currently, pt requires mod - total A for ADLs at bed level, and requires +2 max A for bed mobility only.   Has not been out of bed since Friday.  He is unsafe to return home, will need SNF.   Follow Up Recommendations  SNF    Equipment Recommendations  None recommended by OT    Recommendations for Other Services      Precautions / Restrictions Precautions Precautions: Fall Restrictions Weight Bearing Restrictions: No       Mobility Bed Mobility Overal bed mobility: Needs Assistance Bed Mobility: Supine to Sit;Sit to Supine     Supine to sit: Max assist;+2 for physical assistance Sit to supine: Max assist;+2 for physical assistance   General bed mobility comments: Assist provided for LE advancement towards EOB, as well as to reach for railing and elevate trunk to full sitting position. Bed pad utilized to assist with scooting/positioning EOB.   Transfers                 General transfer comment: Pt unable to attempt due to fatigue     Balance Overall balance assessment: Needs assistance Sitting-balance support: Feet supported;Bilateral upper extremity supported Sitting balance-Leahy Scale:  Poor Sitting balance - Comments: Pt relying on UE support while sitting EOB. Feet sliding and therapists provided support at knees to assist in scooting backwards. Pt with loss of sitting balance posteriorly wich scoot back.                                    ADL either performed or assessed with clinical judgement   ADL Overall ADL's : Needs assistance/impaired             Lower Body Bathing: Maximal assistance;Bed level   Upper Body Dressing : Minimal assistance;Sitting   Lower Body Dressing: Total assistance;Bed level   Toilet Transfer: Total assistance Toilet Transfer Details (indicate cue type and reason): unable to attempt this date  Toileting- Clothing Manipulation and Hygiene: Total assistance;Bed level       Functional mobility during ADLs: Maximal assistance;+2 for physical assistance;+2 for safety/equipment(bed mobility only )       Vision       Perception     Praxis      Cognition Arousal/Alertness: Awake/alert Behavior During Therapy: Flat affect Overall Cognitive Status: Within Functional Limits for tasks assessed                                          Exercises  Shoulder Instructions       General Comments Pt with DOE 4/4 once returned to supine with sats dropping into low 69%.  02 sats recovered to mid 90s on 4L supplemental 02.     Pertinent Vitals/ Pain       Pain Assessment: Faces Faces Pain Scale: Hurts a little bit Pain Location: multiple wounds Pain Descriptors / Indicators: Grimacing;Guarding Pain Intervention(s): Monitored during session;Repositioned  Home Living                                          Prior Functioning/Environment              Frequency  Min 2X/week        Progress Toward Goals  OT Goals(current goals can now be found in the care plan section)  Progress towards OT goals: Progressing toward goals  Acute Rehab OT Goals Patient Stated Goal: "I  want to feel better."  Plan Discharge plan remains appropriate    Co-evaluation    PT/OT/SLP Co-Evaluation/Treatment: Yes Reason for Co-Treatment: Complexity of the patient's impairments (multi-system involvement);For patient/therapist safety;To address functional/ADL transfers PT goals addressed during session: Mobility/safety with mobility OT goals addressed during session: ADL's and self-care;Strengthening/ROM      AM-PAC PT "6 Clicks" Daily Activity     Outcome Measure   Help from another person eating meals?: None Help from another person taking care of personal grooming?: A Little Help from another person toileting, which includes using toliet, bedpan, or urinal?: Total Help from another person bathing (including washing, rinsing, drying)?: A Lot Help from another person to put on and taking off regular upper body clothing?: A Lot Help from another person to put on and taking off regular lower body clothing?: Total 6 Click Score: 13    End of Session Equipment Utilized During Treatment: Oxygen  OT Visit Diagnosis: Unsteadiness on feet (R26.81);Other abnormalities of gait and mobility (R26.89);Muscle weakness (generalized) (M62.81);Pain   Activity Tolerance Patient limited by fatigue   Patient Left in bed;with call bell/phone within reach   Nurse Communication Mobility status        Time: 8325-4982 OT Time Calculation (min): 24 min  Charges: OT General Charges $OT Visit: 1 Visit OT Treatments $Therapeutic Activity: 8-22 mins  [facility], OTR/L [phone]    Lucille Passy M 08/03/2018, 2:17 PM

## 2018-08-03 NOTE — Progress Notes (Signed)
Orthopedic Tech Progress Note Patient Details:  [name] February 21, 1941 [id number]  Patient ID: [name], male   DOB: 07-24-1941, 77 y.o.   MRN: [id number]   [name] 08/03/2018, 7:30 AMOut of unna boot wrap.

## 2018-08-03 NOTE — Care Management (Addendum)
Pt informed CM that he is not sure if he has had his current wheelchair for 5 years - pt to check.  Insurance will only cover a new wheelchair every 5 years  CM and CSW discussed discharge plans with pt. Both resources explained the SNF recommendation and the concern with pt returning to the [facility] with current mobility status and lack of 24 hour assistance.  Pt lives alone however stated that his neighbors are available for periodic assistance if needed.   Pt in agreeable for CSW to fax him out to SNFs.

## 2018-08-03 NOTE — Progress Notes (Signed)
Nutrition Follow-up  DOCUMENTATION CODES:   Morbid obesity  INTERVENTION:  - Will order Ensure Enlive BID, each supplement provides 350 kcal and 20 grams of protein. - Continue to encourage PO intakes.  NUTRITION DIAGNOSIS:   Inadequate oral intake related to decreased appetite as evidenced by per patient/family report, meal completion < 50%  -revised  GOAL:   Patient will meet greater than or equal to 90% of their needs -unmet  MONITOR:   PO intake, Supplement acceptance, Weight trends, Labs, Skin  ASSESSMENT:   77 y.o. Male who presented with weakness and lethargy on 7/30. PMH is significant for prostate adenocarcinoma, HFpEF (EF 60-65%), anemia, BLE lymphadema, T2DM, elephantiasis nostras verrucosa RLE, impaired mobility, morbid obesity, hypertension, HLD, GERD.  Patient was intubated and subsequently extubated on 8/2; s/p cardiac arrest with associated acute respiratory failure on that date. Diet advanced from NPO to Heart Healthy, thin liquids on 8/4 at 9:05 AM. Per flow sheet, patient consumed 25% or breakfast yesterday AM.  He states that for breakfast this AM he had coffee and bites each of pancakes and yogurt. Reports appetite is decreased but slowly improving. He denies abdominal pain/pressure except when abdomen is pressed, denies nausea, denies chewing or swallowing difficulties or pain with these activities. Encouraged patient to eat as well as possible to maintain muscle.  Medications reviewed; 40 mg IV Lasix x2 doses yesterday and x2 doses today, sliding scale Novolog, 2 g IV Mg sulfate x1 run today, 40 mEq oral KCl x2 doses today. Labs reviewed; CBG: 150 mg/dL this AM, K: 3.4 mmol/L, BUN: 5 mg/dL, Ca: 8.3 mg/dL.      Diet Order:   Diet Order           Diet Heart Room service appropriate? Yes; Fluid consistency: Thin  Diet effective now          EDUCATION NEEDS:   No education needs have been identified at this time  Skin:  Skin Assessment: Skin  Integrity Issues: Skin Integrity Issues:: Stage II Stage II: L thigh, sacrum Other: MASD to thigh & sacrum  Last BM:  8/4  Height:   Ht Readings from Last 1 Encounters:  07/31/18 6' (1.829 m)    Weight:   Wt Readings from Last 1 Encounters:  07/30/18 (!) 331 lb 12.7 oz (150.5 kg)    Ideal Body Weight:  80.9 kg  BMI:  Body mass index is 45 kg/m.  Estimated Nutritional Needs:   Kcal:  2000-2200   Protein:  110-120 grams  Fluid:  2 L     Jarome Matin, MS, RD, LDN, [facility] Inpatient Clinical Dietitian Pager # [phone] After hours/weekend pager # [phone]

## 2018-08-03 NOTE — Progress Notes (Signed)
Physical Therapy Treatment Patient Details Name: [name] MRN: [id number] DOB: [birth year] Today's Date: 08/03/2018    History of Present Illness 77 yo male who presented with weakness and lethargy.  PMH is significant for prostate adenocarcinoma, HFpEF (EF 60-65%), anemia, BLE lymphadema, T2DM, elephantiasis nostras verrucosa RLE, impaired mobility, morbid obesity, HTN, HLD, GERD. Admitted with sepsis, respiratory acidosis and AKI. On 8/2 pt became unresponsive and code blue was initiated at end of PT session.     PT Comments    Pt progressing slowly towards physical therapy goals. Reports feeling weak today and only willing to sit EOB with therapy. Noted decreased truncal control in sitting, with posterior lean and sudden LOB posteriorly with shift backwards onto the bed. Pt on RA upon arrival and O2 sats 99-100%. After transition to/from EOB sats dropped as low as 69%. 4L/min supplemental O2 donned and sats increased to upper 90's within 2 minutes. 4L/min O2 left on at end of session per RN.  Will continue to follow and progress as able per POC.   Follow Up Recommendations  SNF     Equipment Recommendations  None recommended by PT    Recommendations for Other Services       Precautions / Restrictions Precautions Precautions: Fall Restrictions Weight Bearing Restrictions: No    Mobility  Bed Mobility Overal bed mobility: Needs Assistance Bed Mobility: Supine to Sit;Sit to Supine     Supine to sit: Max assist;+2 for physical assistance Sit to supine: Max assist;+2 for physical assistance   General bed mobility comments: Assist provided for LE advancement towards EOB, as well as to reach for railing and elevate trunk to full sitting position. Bed pad utilized to assist with scooting/positioning EOB.   Transfers                       Wheelchair Mobility    Modified Rankin (Stroke Patients Only)       Balance Overall balance assessment: Needs  assistance Sitting-balance support: Feet supported;Bilateral upper extremity supported Sitting balance-Leahy Scale: Poor Sitting balance - Comments: Pt relying on UE support while sitting EOB. Feet sliding and therapists provided support at knees to assist in scooting backwards. Pt with loss of sitting balance posteriorly wich scoot back.                                     Cognition Arousal/Alertness: Awake/alert Behavior During Therapy: Flat affect Overall Cognitive Status: Within Functional Limits for tasks assessed                                        Exercises      General Comments        Pertinent Vitals/Pain Pain Assessment: Faces Faces Pain Scale: Hurts a little bit Pain Location: multiple wounds Pain Descriptors / Indicators: Grimacing;Guarding Pain Intervention(s): Monitored during session    Home Living                      Prior Function            PT Goals (current goals can now be found in the care plan section) Acute Rehab PT Goals Patient Stated Goal: "I want to feel better." PT Goal Formulation: With patient Time For Goal Achievement: 08/12/18 Potential to Achieve Goals:  Fair Progress towards PT goals: Not progressing toward goals - comment(Mobility decreased this session)    Frequency    Min 2X/week      PT Plan Current plan remains appropriate    Co-evaluation PT/OT/SLP Co-Evaluation/Treatment: Yes Reason for Co-Treatment: Complexity of the patient's impairments (multi-system involvement);For patient/therapist safety;To address functional/ADL transfers PT goals addressed during session: Mobility/safety with mobility;Balance        AM-PAC PT "6 Clicks" Daily Activity  Outcome Measure  Difficulty turning over in bed (including adjusting bedclothes, sheets and blankets)?: Unable Difficulty moving from lying on back to sitting on the side of the bed? : Unable Difficulty sitting down on and  standing up from a chair with arms (e.g., wheelchair, bedside commode, etc,.)?: Unable Help needed moving to and from a bed to chair (including a wheelchair)?: Total Help needed walking in hospital room?: Total Help needed climbing 3-5 steps with a railing? : Total 6 Click Score: 6    End of Session Equipment Utilized During Treatment: Oxygen Activity Tolerance: Patient limited by fatigue Patient left: in bed;with call bell/phone within reach Nurse Communication: Mobility status PT Visit Diagnosis: Muscle weakness (generalized) (M62.81);Other abnormalities of gait and mobility (R26.89);Difficulty in walking, not elsewhere classified (R26.2)     Time: 1610-9604 PT Time Calculation (min) (ACUTE ONLY): 39 min  Charges:  $Therapeutic Activity: 23-37 mins                     Rolinda Roan, PT, DPT Acute Rehabilitation Services Pager: [place] 08/03/2018, 1:56 PM

## 2018-08-03 NOTE — Progress Notes (Signed)
Family Medicine Teaching Service Daily Progress Note Intern Pager: [phone]  Patient name: [name] Medical record number: [id number] Date of birth: 1941-12-23 Age: 77 y.o. Gender: male  Primary Care Provider: Ennis Bing, DO  Code Status: full Consultations: CSW, WOC, PT/OT, respiratory Admission date: 7/30  Pt Overview and Major Events to Date:  We are resuming primary care of this patient today after stay in ICU s/p cardiac arrest on 8/2.  Subjective: Overnight- patient was stable. He refused to use the CPAP because he feels that he is breathing well and doesn't use his at home. We discussed the benefits of using the CPAP and he agreed to try it tonight. Today- patient is doing well and is conversing better than I have seen before. He is not in acute pain or respiratory distress. He is breathing room air and sat at time of exam was 98-99 so I turned off his oxygen- [place] was not being used. Patient complained of mild and diffuse abdominal pain that was worsened with movement and palpation. He states that he has been eating and drinking well with no nausea or vomiting. He has had diarrhea for at least a day. I will get a Cdiff study.   Objective: Temp:  [98 F (36.7 C)-98.6 F (37 C)] 98.1 F (36.7 C) (08/05 0723) Pulse Rate:  [81-98] 88 (08/05 0800) Resp:  [11-28] 18 (08/05 0800) BP: (100-137)/(56-118) 108/57 (08/05 0200) SpO2:  [95 %-100 %] 100 % (08/05 0818)  Physical Exam: General: obese, non-toxic. Appears well and in NAD Cardiovascular: RRR, 4/6 holosystolic murmer Respiratory: CTAB Abdomen: obese, soft, no masses. Mildly tender to palpation over central abdomen diffusely. Extremities:unchanged from previous exams: poor skin condition-hard, peeling, foul odor. No ulcers or rashes noted. Significant swelling on bilateral legs which is chronic  Assessment and Plan:  Sepsis w/o shock 2/2 UTI/pneumonia/soft tissue infection  Somnolent -resolved -discontinued  antibiotics in ICU -repeat CBC in morning  -Monitor vitals -Up with assistance  SOB  Respiratory Acidosis: -resolved -pt 02 stats are 98-100 ORA  -encouraged use of CPAP at night  AKI Admission labs show a creatinine of 2.2 from a baseline of 0.8 -0.9. -Avoid nephrotoxic agents -Monitor creatinine -Holding home PPI and Lasix   HFpEF, -no longer p -chronic -possible exacerbation leading to arrest -acuteness has resolved, but cautious watching  Patient has a history of HFpEF (EF 60-65%) Chest x-ray shows increased vascular congestion with small left pleural effusion.  Home medications include metoprolol 100 mg twice daily, Lasix 40 mg. -Metoprolol 50 mg twice daily -Follow-up BNP -Strict I's and O's - Daily weights - Holding lasix in setting of AKI - Echo done yesterday showed:  - Left ventricle: The cavity size was normal. Systolic function was   moderately to severely reduced. The estimated ejection fraction   was in the range of 30% to 35%. Severe hypokinesis of the   mid-apicalanteroseptal, anterior, and apical myocardium. Doppler   parameters are consistent with abnormal left ventricular   relaxation (grade 1 diastolic dysfunction).  - Aortic valve: Valve mobility was mildly restricted.  - Mitral valve: Mildly calcified annulus.  - Left atrium: The atrium was moderately dilated.  -consult Cardiology   Decubitus ulcer posterior right thigh, previously stage III  Sacral Ulcer Per nursing note patient has significant skin breakdown and posterior thighs gluteal area with stage III ulcer present during last admission.   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 -Topical nystatin  Chronic venous insufficiency  Elephantiasis nostras verrucosaRLE Patient  has a significant history of venous insufficiency, no new findings at this time. -removed UNNA boots for inspection from wound care and routine care daily recommended   Diabetes Patient has a history of type 2 diabetes with a previous A1c of 9.3 on 05/19/2018.  Home medications include Levemir 50? units twice daily, Novolog 50 units twice daily.  In the hospital so far have been in the 190s. -Moderate SSI -Monitor CBGs  HTN Is on HCTZ and Triameterene, Lasix and metoprolol at home. Blood pressure 120/67 on admission.  - Vitals per floor protocol - Holding home Lasix, and HCTZ-triameterene in setting of AKI - Continue metoprolol for tachycardia and monitor BP  Anemia of chronic disease: -Chronic. Stable.  -Hgb 10.7 on admission, 9.1 yesterday - Monitor for signs of bleeding - CBC in am  HLD Pravastatin 40 mg taken at home. -Continue home medication  GERD Esmeprazole at home. -holding PPI in the setting of AKI  Prostatic Adenocarcinoma Per chart review, urologist is [facility]. CT abdomen/pelvis negative for any obvious pathology concerning for metastatic cancer, however prostate was not entirely visualized. This appears to not be concerning for acute process, recommend further outpatient work-up. PSA elevated at 12.94 on 5/21.  - Outpatient follow-up warranted - continue flomax  Fluids/diet: normal as tolerated PPx: lovenox   Disposition: cautious stable    Laboratory: Recent Labs  Lab 07/31/18 1855 08/01/18 0117 08/02/18 0605  WBC 11.0* 11.1* 10.5  HGB 9.0* 8.3* 9.1*  HCT 31.2* 29.2* 31.9*  PLT 178 279 344   Recent Labs  Lab 07/28/18 1713  08/01/18 0117 08/02/18 0605 08/03/18 0328  NA 137   < > 143 144 143  K 4.7   < > 4.1 3.9 3.4*  CL 99   < > 107 101 98  CO2 22   < > 29 33* 39*  BUN 60*   < > 11 7* 5*  CREATININE 2.45*   < > 0.64 0.70 0.67  CALCIUM 8.8*   < > 8.3* 8.7* 8.3*  PROT 8.1  --   --   --   --   BILITOT 1.5*  --   --   --   --    ALKPHOS 115  --   --   --   --   ALT 18  --   --   --   --   AST 41  --   --   --   --   GLUCOSE 204*   < > 119* 91 175*   < > = values in this interval not displayed.    C diff pending Phos 2.5 Mag 1.5  Imaging/Diagnostic Tests: 2D echo  - Left ventricle: The cavity size was normal. Systolic function was   moderately to severely reduced. The estimated ejection fraction   was in the range of 30% to 35%. Severe hypokinesis of the   mid-apicalanteroseptal, anterior, and apical myocardium. Doppler   parameters are consistent with abnormal left ventricular   relaxation (grade 1 diastolic dysfunction).  - Aortic valve: Valve mobility was mildly restricted.  - Mitral valve: Mildly calcified annulus.  - Left atrium: The atrium was moderately dilated.   Richarda Osmond, DO 08/03/2018, 9:14 AM PGY-1, [place] Intern pager: [phone], text pages welcome

## 2018-08-03 NOTE — Discharge Summary (Addendum)
[facility] Discharge Summary  Patient name: [name] Medical record number: [id number] Date of birth: August 09, 1941 Age: 77 y.o. Gender: male Date of Admission: 07/28/2018  Date of Discharge: 8/8 Admitting Physician: Blane Ohara McDiarmid, MD  Primary Care Provider: Olla Bing, DO Consultations: cardiology, pulmonology  Indication for Hospitalization: weakness  Discharge Diagnoses/Problem List:  Assessment and Plan:  Sepsis w/o shock 2/2 UTI/pneumonia/soft tissue infection  Somnolent -resolved -discontinued antibiotics in ICU -repeat CBC in morning  -Monitor vitals -Up with assistance  SOB  Respiratory Acidosis: -resolved -pt 02 stats are 98-100 ORA  -encouraged use of CPAP at night  AKI -resolved Admission labs show a creatinine of 2.2 from a baseline of 0.8 -0.9. -Avoid nephrotoxic agents -Monitor creatinine -Holding home PPI and Lasix   HFrEF -chronic -possible exacerbation leading to arrest -acuteness has resolved, but cautious watching  Patient has a history of HFpEF (EF 60-65%)Chest x-ray shows increased vascular congestion with small left pleural effusion. -Strict I's and O's - Echo showed:             - Left ventricle: The cavity size was normal. Systolic function was moderately to severely reduced. The estimated ejection fraction was in the range of 30% to 35%. Severe hypokinesis of the mid-apicalanteroseptal, anterior, and apical myocardium. Doppler parameters are consistent with abnormal left ventricular relaxation (grade 1 diastolic dysfunction).             - Aortic valve: Valve mobility was mildly restricted.             - Mitral valve: Mildly calcified annulus.             - Left atrium: The atrium was moderately dilated.       -refer Cardiology outpatient  Decubitus ulcer posterior right thigh, previously stage III  Sacral Ulcer - recommended routine care and is no longer following  Monilla  Intertrigo of abdominal pannus and intergluteal cleft -chronic, improved Patient is morbidly obese and has a significant pannus in addition to history of intertrigo. -Topical nystatin  Chronic venous insufficiency  Elephantiasis nostras verrucosaRLE Patient has a significant history of venous insufficiency, no new findings at this time. -removed UNNA boots for inspection from wound care and routine care daily recommended   Diabetes Patient has a history of type 2 diabetes with a previous A1c of 9.3 on 05/19/2018. Home medications include Levemir 50? units twicedaily, Novolog 50 units twice daily. In the hospital so far have been in the 190s. -Moderate SSI -Monitor CBGs  HTN Is on HCTZ and Triameterene, Lasix and metoprolol at home. Blood pressure 120/67 on admission.  - Vitals per floor protocol - Holding home Lasix, and HCTZ-triameterene in setting of AKI - Continue metoprolol for tachycardia and monitor BP  Anemia of chronic disease: -Chronic. Stable.  -Hgb 10.7 on admission, 9.1 yesterday - Monitor for signs of bleeding - CBC in am  HLD Pravastatin 40 mg taken at home. -Continue home medication  GERD Esmeprazole at home. -holding PPI in the setting of AKI  Prostatic Adenocarcinoma Per chart review, urologist is [facility]. CT abdomen/pelvis negative for any obvious pathology concerning for metastatic cancer, however prostate was not entirely visualized. This appears to not be concerning for acute process, recommend further outpatient work-up. PSA elevated at 12.94 on 5/21.  - Outpatient follow-up warranted - continue flomax  Disposition: discharge to SNF  Discharge Condition: stable  Discharge Exam:  General: resting comfortably, NAD Heart: rrr, 4/6 holosystolic murmer which is  unchanged since admission Lungs: CTAB, stable ORA Extremities: skin changes of legs from chronic swelling  Brief Hospital Course:  Pt admitted for increased weakness. And  was diagnosed with sepsis. CT, xray,  Was given vanc, zosyn. Switched zosyn to merapenem.  Blood pressure stabilized. Pt was transferred to CCM on 8/2 for sudden cardiac arrest lasting 2 minutes before ROSC. Patient intubated and on ventilation for hypoxemia and AMS. Initial significant labs performed: -troponins 0.03 -ABG 1 hour s/p arrest: pH 7.191, pCO2 73.4 -lactic acid 1.2 Significant imaging performed: -CXRAY- cardiomegaly with pulmonary venous congestion, low lung volumes, mild bibasilar atelectasis -CT angio chest- negative for PE, bilateral pleural effusions, bibasilar atelectasis Patient was extubated and on BiPAP x2 days. Cardiology consult and had concern for bradycardia with possible VT on telemetry. Patient was taken off BiPAP and started on 4L [place] with poor toleration x1 day. Pt weaned to room air and tolerated well. Patient remained hemodynamically stable throughout stay.  Dietary consult made diet recommendations. PT consult made recommendations to discharge to SNF at discharge. Wound care consult made recommendations. Palliative care consult spoke to patient and the service was denied at this time. Etiology of cardiac arrest still uncertain. We resumed primary care on 8/5. Patient remained stable and was cleared for discharge to SNF.  Issues for Follow Up:  1. Monitor heart function with repeat echo- follow up with cardiology 2. Monitor respiratory status 3. Regular follow up for chronic conditions 4. Wound care  Significant Procedures:  Echo showed HFrEF of 30-35%  Significant Labs and Imaging:  Recent Labs  Lab 07/31/18 1855 08/01/18 0117 08/02/18 0605  WBC 11.0* 11.1* 10.5  HGB 9.0* 8.3* 9.1*  HCT 31.2* 29.2* 31.9*  PLT 178 279 344   Recent Labs  Lab 07/28/18 1713  07/31/18 0419 07/31/18 1855 08/01/18 0117 08/02/18 0605 08/03/18 0328  NA 137   < > 145 143 143 144 143  K 4.7   < > 4.1 4.2 4.1 3.9 3.4*  CL 99   < > 110 105 107 101 98  CO2 22    < > 29 23 29  33* 39*  GLUCOSE 204*   < > 80 151* 119* 91 175*  BUN 60*   < > 20 13 11  7* 5*  CREATININE 2.45*   < > 0.77 0.74 0.64 0.70 0.67  CALCIUM 8.8*   < > 8.4* 8.5* 8.3* 8.7* 8.3*  MG  --   --   --   --   --  1.5*  --   PHOS  --   --   --   --   --  2.5  --   ALKPHOS 115  --   --   --   --   --   --   AST 41  --   --   --   --   --   --   ALT 18  --   --   --   --   --   --   ALBUMIN 2.5*  --   --   --   --   --   --    < > = values in this interval not displayed.    Results/Tests Pending at Time of Discharge: none  Discharge Medications:  Allergies as of 08/07/2018      Reactions   Ace Inhibitors Cough   With Lisinopril.   Angiotensin Receptor Blockers Other (See Comments)   Caused excessive weight gain   Metformin  Diarrhea      Medication List    STOP taking these medications   EASY TOUCH PEN NEEDLES 31G X 8 MM Misc Generic drug:  Insulin Pen Needle   esomeprazole 40 MG capsule Commonly known as:  NEXIUM   furosemide 40 MG tablet Commonly known as:  LASIX   insulin aspart 100 UNIT/ML injection Commonly known as:  novoLOG   Insulin Detemir 100 UNIT/ML Pen Commonly known as:  LEVEMIR   ONE TOUCH DELICA LANCING DEV Misc   ONE TOUCH ULTRA TEST test strip Generic drug:  glucose blood   ONETOUCH DELICA LANCETS 40J Misc   traMADol 50 MG tablet Commonly known as:  ULTRAM   triamterene-hydrochlorothiazide 37.5-25 MG tablet Commonly known as:  MAXZIDE-25     TAKE these medications   aspirin 81 MG chewable tablet Chew 81 mg by mouth every other day.   ferrous sulfate 325 (65 FE) MG tablet Take 1 tablet (325 mg total) by mouth 2 (two) times daily with a meal.   FLOVENT HFA 220 MCG/ACT inhaler Generic drug:  fluticasone INHALE 1 PUFF INTO THE LUNGS TWICE DAILY   Gerhardt's butt cream Crea Apply 1 application topically 3 (three) times daily.   latanoprost 0.005 % ophthalmic solution Commonly known as:  XALATAN INSTILL 1 DROP IN BOTH EYES EVERY EVENING  FOR GLAUCOMA   losartan 25 MG tablet Commonly known as:  COZAAR Take 1 tablet (25 mg total) by mouth daily. Start taking on:  08/08/2018   metoprolol tartrate 25 MG tablet Commonly known as:  LOPRESSOR Take 0.5 tablets (12.5 mg total) by mouth 2 (two) times daily. What changed:    medication strength  how much to take   mometasone 50 MCG/ACT nasal spray Commonly known as:  NASONEX INSTILL 2 SPRAYS INTO THE NOSE DAILY   nystatin powder Commonly known as:  MYCOSTATIN/NYSTOP Apply topically 2 (two) times daily.   pravastatin 40 MG tablet Commonly known as:  PRAVACHOL TAKE 1 TABLET BY MOUTH DAILY   PROAIR HFA 108 (90 Base) MCG/ACT inhaler Generic drug:  albuterol INHALE 2 PUFFS BY MOUTH EVERY 4 HOURS AS NEEDED FOR WHEEZING OR SHORTNESS OF BREATH   tamsulosin 0.4 MG Caps capsule Commonly known as:  FLOMAX TAKE 2 CAPSULES BY MOUTH EVERY MORNING   torsemide 20 MG tablet Commonly known as:  DEMADEX Take 2 tablets (40 mg total) by mouth daily. Start taking on:  08/08/2018   urea 10 % lotion Apply topically 2 (two) times daily.       Discharge Instructions: Please refer to Patient Instructions section of EMR for full details.  Patient was counseled important signs and symptoms that should prompt return to medical care, changes in medications, dietary instructions, activity restrictions, and follow up appointments.   Follow-Up Appointments: SNF -will need follow up with cardiology to monitor heart function  Richarda Osmond, DO 08/03/2018, 5:29 PM PGY-1, [place]

## 2018-08-03 NOTE — Progress Notes (Signed)
Pt. Refused cpap. RT explained the benefit of wearing a cpap to the pt. RT also informed the pt. To notify if he changes his mind.

## 2018-08-03 NOTE — Progress Notes (Addendum)
Patient arrived to unit in bari bed. Pt alert and oriented, placed on telemetry monitoring. Unna boots in place bilaterally. Pt oriented to call bell and bed controls.  1830 - Patient had BM, stool sample sent. Stool loose, Lona. Foam dressings to pt's buttocks replaced after soap and water cleanse. Pt also has crack to midline of natal cleft. This was cleaned as well.

## 2018-08-04 LAB — CBC
HCT: 28.8 % — ABNORMAL LOW (ref 39.0–52.0)
Hemoglobin: 8.6 g/dL — ABNORMAL LOW (ref 13.0–17.0)
MCH: 25.3 pg — ABNORMAL LOW (ref 26.0–34.0)
MCHC: 29.9 g/dL — ABNORMAL LOW (ref 30.0–36.0)
MCV: 84.7 fL (ref 78.0–100.0)
PLATELETS: 299 10*3/uL (ref 150–400)
RBC: 3.4 MIL/uL — ABNORMAL LOW (ref 4.22–5.81)
RDW: 15.9 % — AB (ref 11.5–15.5)
WBC: 8.5 10*3/uL (ref 4.0–10.5)

## 2018-08-04 LAB — BASIC METABOLIC PANEL
Anion gap: 8 (ref 5–15)
BUN: 5 mg/dL — ABNORMAL LOW (ref 8–23)
CALCIUM: 8.1 mg/dL — AB (ref 8.9–10.3)
CO2: 40 mmol/L — ABNORMAL HIGH (ref 22–32)
CREATININE: 0.71 mg/dL (ref 0.61–1.24)
Chloride: 95 mmol/L — ABNORMAL LOW (ref 98–111)
GFR calc Af Amer: >60 mL/min (ref >60)
Glucose, Bld: 155 mg/dL — ABNORMAL HIGH (ref 70–99)
Potassium: 3.6 mmol/L (ref 3.5–5.1)
SODIUM: 143 mmol/L (ref 135–145)

## 2018-08-04 LAB — GLUCOSE, CAPILLARY
GLUCOSE-CAPILLARY: 135 mg/dL — AB (ref 70–99)
Glucose-Capillary: 153 mg/dL — ABNORMAL HIGH (ref 70–99)
Glucose-Capillary: 141 mg/dL — ABNORMAL HIGH (ref 70–99)
Glucose-Capillary: 128 mg/dL — ABNORMAL HIGH (ref 70–99)

## 2018-08-04 MED ORDER — TORSEMIDE 20 MG PO TABS
40.0000 mg | ORAL_TABLET | Freq: Every day | ORAL | Status: DC
  Administered 2018-08-04 – 2018-08-07 (×4): 40 mg via ORAL
  Filled 2018-08-04 (×4): qty 2

## 2018-08-04 MED ORDER — LOSARTAN POTASSIUM 50 MG PO TABS
25.0000 mg | ORAL_TABLET | Freq: Every day | ORAL | Status: DC
  Administered 2018-08-04 – 2018-08-07 (×4): 25 mg via ORAL
  Filled 2018-08-04 (×4): qty 1

## 2018-08-04 MED ORDER — INSULIN ASPART 100 UNIT/ML ~~LOC~~ SOLN
0.0000 [IU] | Freq: Three times a day (TID) | SUBCUTANEOUS | Status: DC
  Administered 2018-08-04 – 2018-08-05 (×3): 2 [IU] via SUBCUTANEOUS

## 2018-08-04 MED ORDER — LOPERAMIDE HCL 2 MG PO CAPS: 2.0000 mg | ORAL_CAPSULE | ORAL | Status: DC | PRN

## 2018-08-04 MED ORDER — FERROUS SULFATE 325 (65 FE) MG PO TABS
325.0000 mg | ORAL_TABLET | Freq: Two times a day (BID) | ORAL | Status: DC
  Administered 2018-08-04 – 2018-08-07 (×7): 325 mg via ORAL
  Filled 2018-08-04 (×7): qty 1

## 2018-08-04 NOTE — Progress Notes (Signed)
Family Medicine Teaching Service Daily Progress Note Intern Pager: [phone]  Patient name: [name] Medical record number: [id number] Date of birth: 09/11/1941 Age: 77 y.o. Gender: male  Primary Care Provider:  Bing, DO  Code Status: full Consultations: CSW, WOC, PT/OT, respiratory, cardiology Admission date: 7/30  Pt Overview and Major Events to Date:  ICU s/p cardiac arrest on 8/2 We resumed primary care of this patient 8/5  Subjective: Overnight- patient was stable. He tried to use CPAP but did not like the mask so did not wear. Today- patient is doing well and is pleasant.  He states that he has been looking into going to SNF.  And is open to that idea for post discharge care. he is not in acute pain or respiratory distress. He is breathing 1 L nasal cannula and sat at time of exam was 98-99. Patient complained of continued mild and diffuse abdominal pain that was worsened with movement and palpation.  Describes it as a deep soreness 8 out of 10 on the pain scale.  He denies any desire for pain medication.  He continues to have diarrhea.  He would like an Imodium.  His C. difficile study came back negative. he states that he has been eating and drinking well with no nausea or vomiting. We had a discussion about cardiology recommendations.  Cardiology does not recommend catheterization at this time due to decreased cardiac function and output.  Cards recommends follow-up outpatient to monitor cardiac function.  Objective: Temp:  [97.8 F (36.6 C)-99.1 F (37.3 C)] 98.2 F (36.8 C) (08/06 0758) Pulse Rate:  [74-103] 74 (08/06 0758) Resp:  [15-22] 20 (08/06 0758) BP: (98-151)/(56-77) 136/65 (08/06 0758) SpO2:  [93 %-100 %] 98 % (08/06 0824)  Physical Exam: General: obese, non-toxic. Appears well and in NAD Cardiovascular: RRR, 4/6 holosystolic murmer Respiratory: CTAB Abdomen: obese, soft, no masses. Mildly tender to palpation over central abdomen  diffusely. Extremities:unchanged from previous exams: poor skin condition-hard, peeling, foul odor. No ulcers or rashes noted. Significant swelling on bilateral legs which is chronic  Assessment and Plan:  Sepsis w/o shock 2/2 UTI/pneumonia/soft tissue infection  Somnolent -resolved -discontinued antibiotics in ICU -repeat CBC in morning  -Monitor vitals -Up with assistance  SOB  Respiratory Acidosis: -resolved -Positional.  Worsened when laying in supine position. -pt 02 stats are 98-100 on nasal cannula 1 L -Denies use of CPAP at night  AKI -Resolved Admission labs show a creatinine of 2.2 from a baseline of 0.8 -0.9. -Avoid nephrotoxic agents -Monitor creatinine -Holding home PPI -Give 1 dose of Lasix yesterday  HFpEF, -no longer p -chronic -possible exacerbation leading to arrest -acuteness has resolved, but cautious watching  Patient has a history of HFpEF (EF 60-65%) Chest x-ray shows increased vascular congestion with small left pleural effusion.  Home medications include metoprolol 100 mg twice daily, Lasix 40 mg. -Metoprolol 50 mg twice daily -Follow-up BNP -Strict I's and O's - Daily weights -New Echo showed:  - Left ventricle: The cavity size was normal. Systolic function was   moderately to severely reduced. The estimated ejection fraction   was in the range of 30% to 35%. Severe hypokinesis of the   mid-apicalanteroseptal, anterior, and apical myocardium. Doppler   parameters are consistent with abnormal left ventricular   relaxation (grade 1 diastolic dysfunction).  - Aortic valve: Valve mobility was mildly restricted.  - Mitral valve: Mildly calcified annulus.  - Left atrium: The atrium was moderately dilated.  -Cardiology recommended waiting for catheterization  due to low health function and admission.  Recommended patient follow-up outpatient to continue to monitor heart function.  They predict function well improve.  Decubitus ulcer posterior  right thigh, previously stage III  Sacral Ulcer Per nursing note patient has significant skin breakdown and posterior thighs gluteal area with stage III ulcer present during last admission.   -Wound care was consulted- recommended routine care and is no longer following -Note from wound care states that dressing was changed yesterday and sores are healing well.  Monilla Intertrigo of abdominal pannus and intergluteal cleft -chronic, improved Patient is morbidly obese and has a significant pannus in addition to history of intertrigo. -Topical nystatin  Chronic venous insufficiency  Elephantiasis nostras verrucosaRLE Patient has a significant history of venous insufficiency, no new findings at this time. -removed UNNA boots for inspection from wound care and routine care daily recommended   Diabetes Patient has a history of type 2 diabetes with a previous A1c of 9.3 on 05/19/2018.  Home medications include Levemir 50? units twice daily, Novolog 50 units twice daily.  In the hospital so far have been in the 190s. -Moderate SSI -Monitor CBGs  HTN Is on HCTZ and Triameterene, Lasix and metoprolol at home. Blood pressure 120/67 on admission.  - Vitals per floor protocol - Holding home HCTZ-triameterene in setting of AKI  -Consider restarting - Continue metoprolol for tachycardia and monitor BP  Anemia of chronic disease: -Chronic.  Worsening -Hgb 10.7 on admission, 8.6 today -Fecal occult blood test - Monitor for signs of bleeding - CBC in am  HLD Pravastatin 40 mg taken at home. -Continue home medication  GERD Esmeprazole at home. -holding PPI in the setting of AKI  Prostatic Adenocarcinoma Per chart review, urologist is [facility]. CT abdomen/pelvis negative for any obvious pathology concerning for metastatic cancer, however prostate was not entirely visualized. This appears to not be concerning for acute process, recommend further outpatient work-up. PSA  elevated at 12.94 on 5/21.  - Outpatient follow-up warranted - continue flomax  Fluids/diet: normal as tolerated PPx: lovenox   Disposition: cautious stable    Laboratory: Recent Labs  Lab 08/01/18 0117 08/02/18 0605 08/04/18 0518  WBC 11.1* 10.5 8.5  HGB 8.3* 9.1* 8.6*  HCT 29.2* 31.9* 28.8*  PLT 279 344 299   Recent Labs  Lab 07/28/18 1713  08/02/18 0605 08/03/18 0328 08/04/18 0518  NA 137   < > 144 143 143  K 4.7   < > 3.9 3.4* 3.6  CL 99   < > 101 98 95*  CO2 22   < > 33* 39* 40*  BUN 60*   < > 7* 5* 5*  CREATININE 2.45*   < > 0.70 0.67 0.71  CALCIUM 8.8*   < > 8.7* 8.3* 8.1*  PROT 8.1  --   --   --   --   BILITOT 1.5*  --   --   --   --   ALKPHOS 115  --   --   --   --   ALT 18  --   --   --   --   AST 41  --   --   --   --   GLUCOSE 204*   < > 91 175* 155*   < > = values in this interval not displayed.    C diff pending Phos 2.5 Mag 1.5  Imaging/Diagnostic Tests: 2D echo  - Left ventricle: The cavity size was normal. Systolic function was  moderately to severely reduced. The estimated ejection fraction   was in the range of 30% to 35%. Severe hypokinesis of the   mid-apicalanteroseptal, anterior, and apical myocardium. Doppler   parameters are consistent with abnormal left ventricular   relaxation (grade 1 diastolic dysfunction).  - Aortic valve: Valve mobility was mildly restricted.  - Mitral valve: Mildly calcified annulus.  - Left atrium: The atrium was moderately dilated.   Richarda Osmond, DO 08/04/2018, 9:17 AM PGY-1, [place] Intern pager: [phone], text pages welcome

## 2018-08-04 NOTE — Progress Notes (Signed)
RT went to place pt on CPAP for the night. Pt states he does not want to wear it.

## 2018-08-04 NOTE — Progress Notes (Signed)
RT came to place CPAP on patient. RT set up CPap placed it on patient. Patient not able to tolerate mask. RT removed CPAP. Patient refuse to wear CPAP tonight.

## 2018-08-04 NOTE — Progress Notes (Signed)
Orthopedic Tech Progress Note Patient Details:  KYLAND NO 08/25/1941 [id number]  Ortho Devices Type of Ortho Device: [name] boot Ortho Device/Splint Location: Bilateral unna boots wraps  Ortho Device/Splint Interventions: Application   Post Interventions Patient Tolerated: Well Instructions Provided: Care of device   [name] 08/04/2018, 7:26 PM

## 2018-08-04 NOTE — Consult Note (Signed)
   [facility] CM Inpatient Consult   08/04/2018  KLARK VANDERHOEF 1941-02-01 [id number]   [facility] Care Management follow up.   Mr. Chaffin now on telemetry unit. Spoke with inpatient RNCM and inpatient LCSW to make aware [place] Management is following. Will make appropriate [facility] Care Management referrals once disposition is known.   Marthenia Rolling, MSN-Ed, RN,BSN [facility] Liaison [phone]

## 2018-08-05 LAB — COMPREHENSIVE METABOLIC PANEL
ALBUMIN: 1.9 g/dL — AB (ref 3.5–5.0)
ALT: 12 U/L (ref 0–44)
AST: 14 U/L — AB (ref 15–41)
Alkaline Phosphatase: 67 U/L (ref 38–126)
Anion gap: 13 (ref 5–15)
BILIRUBIN TOTAL: 0.9 mg/dL (ref 0.3–1.2)
BUN: 13 mg/dL (ref 8–23)
CHLORIDE: 90 mmol/L — AB (ref 98–111)
CO2: 38 mmol/L — ABNORMAL HIGH (ref 22–32)
Calcium: 8.1 mg/dL — ABNORMAL LOW (ref 8.9–10.3)
Creatinine, Ser: 0.78 mg/dL (ref 0.61–1.24)
GFR calc Af Amer: >60 mL/min (ref >60)
GLUCOSE: 151 mg/dL — AB (ref 70–99)
POTASSIUM: 3.6 mmol/L (ref 3.5–5.1)
Sodium: 141 mmol/L (ref 135–145)
TOTAL PROTEIN: 6.2 g/dL — AB (ref 6.5–8.1)

## 2018-08-05 LAB — GLUCOSE, CAPILLARY
GLUCOSE-CAPILLARY: 140 mg/dL — AB (ref 70–99)
GLUCOSE-CAPILLARY: 192 mg/dL — AB (ref 70–99)
GLUCOSE-CAPILLARY: 227 mg/dL — AB (ref 70–99)
GLUCOSE-CAPILLARY: 145 mg/dL — AB (ref 70–99)

## 2018-08-05 LAB — CBC
HCT: 28.6 % — ABNORMAL LOW (ref 39.0–52.0)
Hemoglobin: 8.7 g/dL — ABNORMAL LOW (ref 13.0–17.0)
MCH: 25.4 pg — ABNORMAL LOW (ref 26.0–34.0)
MCHC: 30.4 g/dL (ref 30.0–36.0)
MCV: 83.6 fL (ref 78.0–100.0)
PLATELETS: 298 10*3/uL (ref 150–400)
RBC: 3.42 MIL/uL — ABNORMAL LOW (ref 4.22–5.81)
RDW: 15.8 % — AB (ref 11.5–15.5)
WBC: 8.8 10*3/uL (ref 4.0–10.5)

## 2018-08-05 LAB — OCCULT BLOOD X 1 CARD TO LAB, STOOL: Fecal Occult Bld: POSITIVE — AB

## 2018-08-05 MED ORDER — CALCIUM CARBONATE ANTACID 500 MG PO CHEW
1.0000 | CHEWABLE_TABLET | Freq: Once | ORAL | Status: AC
  Administered 2018-08-06: 200 mg via ORAL
  Filled 2018-08-05: qty 1

## 2018-08-05 MED ORDER — GI COCKTAIL ~~LOC~~
30.0000 mL | Freq: Once | ORAL | Status: AC
  Administered 2018-08-05: 30 mL via ORAL
  Filled 2018-08-05: qty 30

## 2018-08-05 NOTE — NC FL2 (Signed)
[place] LEVEL OF CARE SCREENING TOOL     IDENTIFICATION  Patient Name: [name] Birthdate: June 25, 1941 Sex: male Admission Date (Current Location): 07/28/2018  [facility] and [place] Number:  Herbalist and Address:  [facility], [place] [address] [place], [place] [address]      Provider Number: [id number]  Attending Physician Name and Address:  Lind Covert, MD  Relative Name and Phone Number:  Ivin Booty, daughter, [phone]    Current Level of Care: Hospital Recommended Level of Care: [place] Prior Approval Number:    Date Approved/Denied:   PASRR Number: [id number]  Discharge Plan: SNF    Current Diagnoses: Patient Active Problem List   Diagnosis Date Noted  . Possible OSA; on empiric CPAP 08/03/2018  . Fungus present in urine 08/03/2018  . Abdominal aortic atherosclerosis ([place]) 07/29/2018  . Pyuria 07/29/2018  . Sacral decubitus ulcer, stage III ([place]) 07/29/2018  . Somnolence 07/29/2018  . Acute respiratory acidosis 07/29/2018  . Poor personal hygiene 07/29/2018  . Hemoglobin decreased 07/29/2018  . Sepsis ([place]) 07/28/2018  . Pneumonia 06/04/2018  . Dehydration   . Possible HCAP (healthcare-associated pneumonia)   . Hypotension   . Grade II diastolic dysfunction   . Fluid overload 05/19/2018  . Elephantiasis nostra verrucosa 05/19/2018  . Pressure ulcer of upper thigh, right, stage III ([place]) 05/19/2018  . Candidal intertrigo 05/19/2018  . Chronic venous insufficiency, Bilateral Legs 05/19/2018  . Elevated PSA, greater than or equal to 20 ng/ml 05/19/2018  . History of prostate surgery, Per Patient report, [facility] 05/19/2018  . Infestation by bed bug 05/19/2018  . Lives alone with limited help 05/19/2018  . Morbid obesity ([place]) 05/19/2018  . Dyspnea and respiratory abnormality 05/19/2018  . Systolic ejection murmur with radiation into Cartoids bilaterally 05/19/2018  .  Paget's bone disease 05/19/2018  . Pulmonary hypertension ([place]) 05/19/2018  . Hypoxia   . AKI (acute kidney injury) ([place])   . Impaired mobility 09/09/2014  . Ulcer of left lower leg ([place]) 10/05/2012  . Palliative care encounter 10/05/2012  . Anemia of chronic disease 05/21/2012  . Chronic leg pain 11/17/2011  . HLD (hyperlipidemia) 12/01/2008  . ADENOCARCINOMA, PROSTATE 05/24/2008  . Morbid obesity with body mass index of 50.0-59.9 in adult ([place]) 03/05/2007  . Essential hypertension 03/05/2007    Orientation RESPIRATION BLADDER Height & Weight     Self, Time, Situation, Place  O2(Nasal cannula 1L) Continent Weight: (!) 150.5 kg (331 lb 12.7 oz) Height:  6' (182.9 cm)  BEHAVIORAL SYMPTOMS/MOOD NEUROLOGICAL BOWEL NUTRITION STATUS      Continent Diet(Please see DC Summary)  AMBULATORY STATUS COMMUNICATION OF NEEDS Skin   Extensive Assist Verbally PU Stage and Appropriate Care(Stage II on Sacrum and thigh)                       Personal Care Assistance Level of Assistance  Bathing, Feeding, Dressing Bathing Assistance: Maximum assistance Feeding assistance: Independent Dressing Assistance: Limited assistance     Functional Limitations Info  Sight, Hearing, Speech Sight Info: Impaired Hearing Info: Adequate Speech Info: Adequate    SPECIAL CARE FACTORS FREQUENCY  PT (By licensed PT), OT (By licensed OT)     PT Frequency: 5x/week OT Frequency: 3x/week            Contractures Contractures Info: Not present    Additional Factors Info  Code Status, Allergies, Insulin Sliding Scale Code Status Info: Full Allergies  Info: Ace Inhibitors, Angiotensin Receptor Blockers, Metformin   Insulin Sliding Scale Info: 3x daily with meals       Current Medications (08/05/2018):  This is the current hospital active medication list Current Facility-Administered Medications  Medication Dose Route Frequency Provider Last Rate Last Dose  . 0.9 %  sodium chloride infusion    Intravenous Continuous Juanito Doom, MD   Stopped at 08/03/18 [phone]  . acetaminophen (TYLENOL) tablet 650 mg  650 mg Oral Q6H PRN Shirley, [place], DO       Or  . acetaminophen (TYLENOL) suppository 650 mg  650 mg Rectal Q6H PRN Shirley, [place], DO      . budesonide (PULMICORT) nebulizer solution 0.5 mg  0.5 mg Nebulization BID Pierce, Dwayne A, RPH   0.5 mg at 08/05/18 1224  . chlorhexidine (PERIDEX) 0.12 % solution 15 mL  15 mL Mouth Rinse BID Simonne Maffucci B, MD   15 mL at 08/05/18 0828  . enoxaparin (LOVENOX) injection 75 mg  75 mg Subcutaneous Q24H Harvel Quale, RPH   75 mg at 08/04/18 2104  . feeding supplement (ENSURE ENLIVE) (ENSURE ENLIVE) liquid 237 mL  237 mL Oral BID BM Simonne Maffucci B, MD   237 mL at 08/05/18 0833  . ferrous sulfate tablet 325 mg  325 mg Oral BID WC Rory Percy, DO   325 mg at 08/05/18 8250  . Gerhardt's butt cream   Topical TID Simonne Maffucci B, MD      . insulin aspart (novoLOG) injection 0-15 Units  0-15 Units Subcutaneous TID WC Lind Covert, MD   2 Units at 08/05/18 [phone]  . ipratropium-albuterol (DUONEB) 0.5-2.5 (3) MG/3ML nebulizer solution 3 mL  3 mL Nebulization Q4H PRN Parrett, Tammy S, NP      . loperamide (IMODIUM) capsule 2 mg  2 mg Oral PRN Ouida Sills, Chelsey L, DO      . losartan (COZAAR) tablet 25 mg  25 mg Oral Daily Rory Percy, DO   25 mg at 08/05/18 4888  . MEDLINE mouth rinse  15 mL Mouth Rinse q12n4p Simonne Maffucci B, MD   15 mL at 08/04/18 1246  . metoprolol tartrate (LOPRESSOR) tablet 12.5 mg  12.5 mg Oral BID Rush Farmer, MD   12.5 mg at 08/05/18 9169  . nystatin (MYCOSTATIN/NYSTOP) topical powder   Topical BID Shirley, [place], DO      . pravastatin (PRAVACHOL) tablet 40 mg  40 mg Oral Daily Enid Derry, [place], DO   40 mg at 08/05/18 0829  . tamsulosin (FLOMAX) capsule 0.8 mg  0.8 mg Oral q morning - 10a Enid Derry, [place], DO   0.8 mg at 08/05/18 4503  . torsemide (DEMADEX) tablet 40 mg  40 mg Oral Daily  Rory Percy, DO   40 mg at 08/05/18 0829  . urea 10 % lotion   Topical BID McDiarmid, Blane Ohara, MD         Discharge Medications: Please see discharge summary for a list of discharge medications.  Relevant Imaging Results:  Relevant Lab Results:   Additional Information SS# [id number]   On an air [place] Jaclynn Laumann, LCSWA

## 2018-08-05 NOTE — Progress Notes (Signed)
[facility] unable to accept patient. CSW encouraged patient to review the SNF list and pick another facility. Patient gave CSW permission to contact his daughter, Ivin Booty, to help with SNF choice. CSW left her a voicemail. CSW contacted insurance company to begin [facility]. Patient was recently at [facility], so may go to medical review.   Percell Locus Raynah Gomes LCSW [phone]

## 2018-08-05 NOTE — Progress Notes (Signed)
Patient ID: [name], male   DOB: Jun 05, 1941, 77 y.o.   MRN: [id number]  Evaluated patient at bedside for complaints of abdominal pain.  Patient states the non-radiating epigastric and right upper quadrant abdominal pain started earlier this afternoon, after having 2 cups of orange juice and taking his iron pill, and has further progressed since.  He is unable to characterize the pain, states it "just hurts".  He notes increased pain when he presses on it.  He had a similar pain a few days ago, and received Maalox with improvement.  Patient does note a history of heartburn, states this feels somewhat similar.  Denies any nausea, vomiting, change in bowel movements, hematochezia/melena, CP, SOB, dysuria, or loss of appetite. Last BM yesterday, normal consistency and color.  Blood pressure (!) 124/58, pulse 73, temperature 97.9 F (36.6 C), temperature source Oral, resp. rate 19, height 6' (1.829 m), weight (!) 331 lb 12.7 oz (150.5 kg), SpO2 92 %.  General: Older gentleman of large body habitus, no acute distress.  Chest: Lungs clear, cardiac regular rate and rhythm, unchanged murmur. No increased WOB.  Abdomen: Soft, non-distended, tender to epigastric and RUQ.  Normoactive BS x4.  Unable to test Murphy's due to body habitus.  No rebounding or guarding. Skin: No rash or skin changes noted on abdomen   Likely abdominal irritation from iron pill vs GERD.  Will provide a GI cocktail and follow-up for resolution of symptoms.  Darrelyn Hillock, DO

## 2018-08-05 NOTE — Progress Notes (Addendum)
Family Medicine Teaching Service Daily Progress Note Intern Pager: [phone]  Patient name: [name] Medical record number: [id number] Date of birth: 12-22-1941 Age: 77 y.o. Gender: male  Primary Care Provider: [place] Bing, DO  Code Status: full Consultations: CSW, WOC, PT/OT, respiratory, cardiology Admission date: 7/30  Pt Overview and Major Events to Date:  ICU s/p cardiac arrest on 8/2 No clear cause was found. Will follow up with cards We resumed primary care of this patient 8/5  Subjective: Overnight- patient was stable. Today-patient was confused when I saw him.  He was orientated to self and year.  When I asked him if he was ready to leave the hospital he said "I do not want to go to hospital". He did not know that he was in the hospital.  When I oriented him to being in hospital he was able to state that he was at [name]. He mentioned a barbecue downstairs multiple times during our interaction.  When I asked him how much oxygen he was on overnight he stated "what oxygen I do not have any oxygen?" At that time he did have a nasal cannula on with 2 L.  I oriented him to the nasal cannula being in his nose with his hands. He was very pleasant and cooperative.  He did not complain of any concerns.  I ordered a CBC and CMP. His confusion improved for subsequent providers who examined him in the next hour or so.  We had planned to discharge [name] today, however, he was not approved for he facility he chose ([facility]) so CSW is working with him and his daughter to find another facility. She is confident that he will be approved by tomorrow so we can discharge him then.  We will follow-up with SW.  Objective: Temp:  [98.4 F (36.9 C)-99.1 F (37.3 C)] 99 F (37.2 C) (08/07 0826) Pulse Rate:  [59-88] 88 (08/07 0826) Resp:  [18-20] 19 (08/07 0404) BP: (112-154)/(49-68) 112/57 (08/07 0826) SpO2:  [91 %-100 %] 100 % (08/07 0826)  Physical Exam: General: obese,  non-toxic. Appears well and in NAD Cardiovascular: RRR, 4/6 holosystolic murmer Respiratory: CTAB Abdomen: obese, soft, no masses.  Extremities:unchanged from previous exams: poor skin condition-hard, peeling, foul odor. No ulcers or rashes noted. Significant swelling on bilateral legs which is chronic Psych: He appeared confused and did not answer all questions appropriately.  Was oriented to self and year.  Did not realize he was in the hospital.   Assessment and Plan:  Delirum -new onset this morning on first visit -CBC and CMET did not show acute changes -oxygen saturation and vitals remained stable -patient improved rapidly in orientation for subsequent exams from other providers -possibly just disorientation from waking up and in dream state and exacerbated by moving several rooms recently? -continue to monitor -morning CBC and CMET -morning ABG if the delirium continues or returns  Sepsis w/o shock 2/2 UTI/pneumonia/soft tissue infection  Somnolent -resolved -discontinued antibiotics in ICU -repeat CBC in morning  -Monitor vitals -Up with assistance  SOB  Respiratory Acidosis: -resolved -Positional.  Worsened when laying in supine position. -pt 02 stats are 98-100 on room air -d/c oxygen order -d/c Pulmicort -restart all home medications MDI -Denies use of CPAP at night  AKI -Resolved Admission labs show a creatinine of 2.2 from a baseline of 0.8 -0.9. -Avoid nephrotoxic agents -Monitor creatinine -Holding home PPI -Give 1 dose of Lasix yesterday  HFpEF, -no longer p -chronic -possible exacerbation leading  to arrest -acuteness has resolved, but cautious watching  Patient has a history of HFpEF (EF 60-65%) Chest x-ray shows increased vascular congestion with small left pleural effusion.  Home medications include metoprolol 100 mg twice daily, Lasix 40 mg. -Metoprolol 50 mg twice daily -Follow-up BNP -Strict I's and O's -New Echo showed:  - Left  ventricle: The cavity size was normal. Systolic function was   moderately to severely reduced. The estimated ejection fraction   was in the range of 30% to 35%. Severe hypokinesis of the   mid-apicalanteroseptal, anterior, and apical myocardium. Doppler   parameters are consistent with abnormal left ventricular   relaxation (grade 1 diastolic dysfunction).  - Aortic valve: Valve mobility was mildly restricted.  - Mitral valve: Mildly calcified annulus.  - Left atrium: The atrium was moderately dilated.  -Cardiology recommended waiting for catheterization due to low health function and admission.  Recommended patient follow-up outpatient to continue to monitor heart function.  They predict function will improve.  Decubitus ulcer posterior right thigh, previously stage III  Sacral Ulcer Per nursing note patient has significant skin breakdown and posterior thighs gluteal area with stage III ulcer present during last admission.   -Wound care was consulted- recommended routine care and is no longer following -Note from wound care states that dressing was changed yesterday and sores are healing well.  [place] Intertrigo of abdominal pannus and intergluteal cleft -chronic, improved Patient is morbidly obese and has a significant pannus in addition to history of intertrigo. -Topical nystatin  Chronic venous insufficiency  Elephantiasis nostras verrucosaRLE Patient has a significant history of venous insufficiency, no new findings at this time. -removed UNNA boots for inspection from wound care and routine care daily recommended   Diabetes Patient has a history of type 2 diabetes with a previous A1c of 9.3 on 05/19/2018.  -stable, well controlled without home medication -recommend not continuing home meds upon discharge -Monitor CBGs daily  HTN Is on HCTZ and Triameterene, Lasix and metoprolol at home. Blood pressure 120/67 on admission.  - Vitals per floor protocol - Holding home  HCTZ-triameterene in setting of AKI  -Consider restarting - Continue metoprolol for tachycardia and monitor BP  Anemia of chronic disease: -Chronic.  Worsening -Hgb 10.7 on admission, 8.6 today -Fecal occult blood test - Monitor for signs of bleeding - CBC in am  HLD Pravastatin 40 mg taken at home. -Continue home medication  GERD Esmeprazole at home. -holding PPI in the setting of AKI  Prostatic Adenocarcinoma Per chart review, urologist is [facility]. CT abdomen/pelvis negative for any obvious pathology concerning for metastatic cancer, however prostate was not entirely visualized. This appears to not be concerning for acute process, recommend further outpatient work-up. PSA elevated at 12.94 on 5/21.  - Outpatient follow-up warranted - continue flomax  Fluids/diet: normal as tolerated PPx: lovenox   Disposition: stable Discharge to [facility]   Laboratory: Recent Labs  Lab 08/01/18 0117 08/02/18 0605 08/04/18 0518  WBC 11.1* 10.5 8.5  HGB 8.3* 9.1* 8.6*  HCT 29.2* 31.9* 28.8*  PLT 279 344 299   Recent Labs  Lab 08/02/18 0605 08/03/18 0328 08/04/18 0518  NA 144 143 143  K 3.9 3.4* 3.6  CL 101 98 95*  CO2 33* 39* 40*  BUN 7* 5* 5*  CREATININE 0.70 0.67 0.71  CALCIUM 8.7* 8.3* 8.1*  GLUCOSE 91 175* 155*    C diff neg Phos 2.5 Mag 1.5  Imaging/Diagnostic Tests: 2D echo  - Left ventricle: The cavity  size was normal. Systolic function was   moderately to severely reduced. The estimated ejection fraction   was in the range of 30% to 35%. Severe hypokinesis of the   mid-apicalanteroseptal, anterior, and apical myocardium. Doppler   parameters are consistent with abnormal left ventricular   relaxation (grade 1 diastolic dysfunction).  - Aortic valve: Valve mobility was mildly restricted.  - Mitral valve: Mildly calcified annulus.  - Left atrium: The atrium was moderately dilated.   Richarda Osmond, DO 08/05/2018, 9:33 AM PGY-1, [place] Intern pager: [phone], text pages welcome

## 2018-08-05 NOTE — Clinical Social Work Note (Signed)
Clinical Social Work Assessment  Patient Details  Name: [name] MRN: [id number] Date of Birth: 1941-07-26  Date of referral:  08/05/18               Reason for consult:  Facility Placement                Permission sought to share information with:  Facility Sport and exercise psychologist, Family Supports Permission granted to share information::  Yes, Verbal Permission Granted  Name::     Airline pilot::  SNFs  Relationship::  Daughter  Contact Information:  [phone]  Housing/Transportation Living arrangements for the past 2 months:  [place], [place] of Information:  Patient, Adult Children Patient Interpreter Needed:  None Criminal Activity/Legal Involvement Pertinent to Current Situation/Hospitalization:  No - Comment as needed Significant Relationships:  Adult Children Lives with:  Self Do you feel safe going back to the place where you live?  No Need for family participation in patient care:  Yes (Comment)  Care giving concerns:  CSW received consult for possible SNF placement at time of discharge. CSW spoke with patient regarding PT recommendation of SNF placement at time of discharge. Patient reported that he realizes that he cannot go home in his current condition and is agreeable to SNF placement at time of discharge. CSW to continue to follow and assist with discharge planning needs.   Social Worker assessment / plan:  CSW spoke with patient concerning possibility of rehab at [facility] before returning home.  Employment status:  Retired Nurse, adult PT Recommendations:  [place] / Referral to community resources:  [place]  Patient/Family's Response to care:  Patient recognizes need for rehab before returning home and is agreeable to a SNF in [place]. Patient reported preference for [place]. He does not want to go back to [place], where he was in  June. CSW explained that insurance will need to be pre-authorized.   Patient/Family's Understanding of and Emotional Response to Diagnosis, Current Treatment, and Prognosis:  Patient/family is realistic regarding therapy needs and expressed being hopeful for SNF placement. Patient expressed understanding of CSW role and discharge process as well as medical condition. No questions/concerns about plan or treatment.    Emotional Assessment Appearance:  Appears stated age Attitude/Demeanor/Rapport:  Guarded Affect (typically observed):  Accepting, Appropriate, Quiet Orientation:  Oriented to Self, Oriented to Place, Oriented to  Time, Oriented to Situation Alcohol / Substance use:  Not Applicable Psych involvement (Current and /or in the community):  No (Comment)  Discharge Needs  Concerns to be addressed:  Care Coordination Readmission within the last 30 days:  No Current discharge risk:  None Barriers to Discharge:  Continued Medical Work up   [employer], [place] 08/05/2018, 10:42 AM

## 2018-08-06 DIAGNOSIS — J9601 Acute respiratory failure with hypoxia: Secondary | ICD-10-CM

## 2018-08-06 LAB — CBC
HEMATOCRIT: 29.2 % — AB (ref 39.0–52.0)
HEMOGLOBIN: 9.1 g/dL — AB (ref 13.0–17.0)
MCH: 25.7 pg — ABNORMAL LOW (ref 26.0–34.0)
MCHC: 31.2 g/dL (ref 30.0–36.0)
MCV: 82.5 fL (ref 78.0–100.0)
Platelets: 278 10*3/uL (ref 150–400)
RBC: 3.54 MIL/uL — AB (ref 4.22–5.81)
RDW: 15.7 % — ABNORMAL HIGH (ref 11.5–15.5)
WBC: 7.7 10*3/uL (ref 4.0–10.5)

## 2018-08-06 LAB — COMPREHENSIVE METABOLIC PANEL
ALBUMIN: 2 g/dL — AB (ref 3.5–5.0)
ALT: 11 U/L (ref 0–44)
AST: 13 U/L — AB (ref 15–41)
Alkaline Phosphatase: 66 U/L (ref 38–126)
Anion gap: 9 (ref 5–15)
BILIRUBIN TOTAL: 0.7 mg/dL (ref 0.3–1.2)
BUN: 10 mg/dL (ref 8–23)
CO2: 39 mmol/L — ABNORMAL HIGH (ref 22–32)
CREATININE: 0.77 mg/dL (ref 0.61–1.24)
Calcium: 8.1 mg/dL — ABNORMAL LOW (ref 8.9–10.3)
Chloride: 91 mmol/L — ABNORMAL LOW (ref 98–111)
GFR calc Af Amer: >60 mL/min (ref >60)
GLUCOSE: 158 mg/dL — AB (ref 70–99)
Potassium: 3.4 mmol/L — ABNORMAL LOW (ref 3.5–5.1)
Sodium: 139 mmol/L (ref 135–145)
TOTAL PROTEIN: 6.6 g/dL (ref 6.5–8.1)

## 2018-08-06 LAB — LIPASE, BLOOD: LIPASE: 22 U/L (ref 11–51)

## 2018-08-06 MED ORDER — POTASSIUM CHLORIDE CRYS ER 20 MEQ PO TBCR
40.0000 meq | EXTENDED_RELEASE_TABLET | Freq: Once | ORAL | Status: AC
  Administered 2018-08-06: 40 meq via ORAL
  Filled 2018-08-06: qty 2

## 2018-08-06 MED ORDER — FAMOTIDINE 20 MG PO TABS
20.0000 mg | ORAL_TABLET | Freq: Once | ORAL | Status: AC
  Administered 2018-08-06: 20 mg via ORAL
  Filled 2018-08-06: qty 1

## 2018-08-06 NOTE — Progress Notes (Signed)
Pt ref CPAP, resting comfortably, will cont to monitor.

## 2018-08-06 NOTE — Progress Notes (Signed)
Patient refused CPAP for the night  

## 2018-08-06 NOTE — Progress Notes (Signed)
Physical Therapy Treatment Patient Details Name: [name] MRN: [id number] DOB: [birth year] Today's Date: 08/06/2018    History of Present Illness 77 yo male who presented with weakness and lethargy.  PMH is significant for prostate adenocarcinoma, HFpEF (EF 60-65%), anemia, BLE lymphadema, T2DM, elephantiasis nostras verrucosa RLE, impaired mobility, morbid obesity, HTN, HLD, GERD. Admitted with sepsis, respiratory acidosis and AKI. On 8/2 pt became unresponsive and code blue was initiated at end of PT session.     PT Comments    Patient requires assistance for all bed mobility with use of rails. Pt is unable to lay flat due to feeling like he cannot breath. SpO2 >93% throughout session on RN. Pt declined attempt of lateral scoot OOB to drop arm recliner or sit to stand transfer without bariatric RW. Will take bariatric RW next session for further mobility progression. Continue to progress as tolerated with anticipated d/c to SNF for further skilled PT services.     Follow Up Recommendations  SNF     Equipment Recommendations  None recommended by PT    Recommendations for Other Services OT consult     Precautions / Restrictions Precautions Precautions: Fall Restrictions Weight Bearing Restrictions: No    Mobility  Bed Mobility Overal bed mobility: Needs Assistance Bed Mobility: Supine to Sit;Sit to Supine Rolling: +2 for physical assistance;Mod assist   Supine to sit: Mod assist;+2 for safety/equipment;HOB elevated Sit to supine: Mod assist;+2 for safety/equipment   General bed mobility comments: assistance required to roll side to side and more difficulty with rolling toward R side; use of rails for all bed mobility; assistance needed to bring L LE to EOB and to elevate trunk into sitting  Transfers                 General transfer comment: pt declined attempting lateral scoot EOB to drop arm recliner or sit to stand without bariatric RW  Ambulation/Gait                  Stairs             Wheelchair Mobility    Modified Rankin (Stroke Patients Only)       Balance Overall balance assessment: Needs assistance Sitting-balance support: Feet supported;No upper extremity supported Sitting balance-Leahy Scale: Fair                                      Cognition Arousal/Alertness: Awake/alert Behavior During Therapy: Flat affect Overall Cognitive Status: Within Functional Limits for tasks assessed                                        Exercises General Exercises - Lower Extremity Ankle Circles/Pumps: Both;20 reps;Seated Long Arc Quad: Right;20 reps;Seated(L knee ROM limitations )    General Comments General comments (skin integrity, edema, etc.): pt on RA throughout session and SpO2 >93%; L proximal calf bleeding through bandage and RN notified      Pertinent Vitals/Pain Pain Assessment: Faces Faces Pain Scale: Hurts a little bit Pain Location: L LE Pain Descriptors / Indicators: Guarding Pain Intervention(s): Limited activity within patient's tolerance;Monitored during session;Repositioned    Home Living                      Prior Function  PT Goals (current goals can now be found in the care plan section) Acute Rehab PT Goals PT Goal Formulation: With patient Time For Goal Achievement: 08/12/18 Potential to Achieve Goals: Fair Progress towards PT goals: Progressing toward goals    Frequency    Min 2X/week      PT Plan Current plan remains appropriate    Co-evaluation              AM-PAC PT "6 Clicks" Daily Activity  Outcome Measure  Difficulty turning over in bed (including adjusting bedclothes, sheets and blankets)?: Unable Difficulty moving from lying on back to sitting on the side of the bed? : A Lot Difficulty sitting down on and standing up from a chair with arms (e.g., wheelchair, bedside commode, etc,.)?: Unable Help needed  moving to and from a bed to chair (including a wheelchair)?: Total Help needed walking in hospital room?: Total Help needed climbing 3-5 steps with a railing? : Total 6 Click Score: 7    End of Session   Activity Tolerance: Patient limited by fatigue Patient left: in bed;with call bell/phone within reach Nurse Communication: Mobility status PT Visit Diagnosis: Muscle weakness (generalized) (M62.81);Other abnormalities of gait and mobility (R26.89);Difficulty in walking, not elsewhere classified (R26.2)     Time: 7858-8502 PT Time Calculation (min) (ACUTE ONLY): 33 min  Charges:  $Therapeutic Activity: 23-37 mins                     Earney Navy, PTA Pager: [phone]     Darliss Cheney 08/06/2018, 11:12 AM

## 2018-08-06 NOTE — Progress Notes (Signed)
Family Medicine Teaching Service Daily Progress Note Intern Pager: [phone]  Patient name: [name] Medical record number: [id number] Date of birth: January 15, 1941 Age: 77 y.o. Gender: male  Primary Care Provider: Leetonia Bing, DO  Code Status: full Consultations: CSW, WOC, PT/OT, respiratory, cardiology Admission date: 7/30  Pt Overview and Major Events to Date:  ICU s/p cardiac arrest on 8/2 No clear cause was found. HF worsened to EF 30-35%. We resumed primary care of this patient 8/5.  Subjective: Overnight- patient was stable. He complained of the intermittent right sided abdominal pain that he has had the past few days. He was given a GI cocktail which helped him the most. He was also given tums which did not help as much. Today-patient was not confused. He was well oriented to place and time. He was not on oxygen and stated he has no difficulty breathing. He is not currently experiencing any abdominal pain. He states that he is sleeping well, and eating well.  We had planned to discharge [name] yesterday, however, he was not approved for the facility he chose ([name]) so CSW is working with him and his daughter to find another facility. She is confident that he will be approved by today so we can discharge him then.  We will follow-up with SW.  Objective: Temp:  [97.8 F (36.6 C)-99 F (37.2 C)] 97.8 F (36.6 C) (08/08 0423) Pulse Rate:  [62-88] 72 (08/08 0423) Resp:  [18-19] 19 (08/08 0423) BP: (112-142)/(57-78) 142/78 (08/08 0423) SpO2:  [92 %-100 %] 93 % (08/08 0423)  Physical Exam: General: obese, non-toxic. Appears well and in NAD Cardiovascular: RRR, 4/6 holosystolic murmer Respiratory: CTAB Abdomen: obese, soft, no masses.  Extremities:unchanged from previous exams: poor skin condition-hard, peeling, foul odor. No ulcers or rashes noted. Significant swelling on bilateral legs which is chronic Psych: He appeared confused and did not answer all questions  appropriately.  Was oriented to self and year.  Did not realize he was in the hospital.   Assessment and Plan:  Sepsis w/o shock 2/2 UTI/pneumonia/soft tissue infection  Somnolent -resolved -discontinued antibiotics in ICU -repeat CBC in morning  -Monitor vitals -Up with assistance  SOB  Respiratory Acidosis: -resolved -Positional.  Worsened when laying in supine position. -pt 02 stats are 98-100 on room air -d/c oxygen order -d/c Pulmicort -restart all home medications MDI -Denies use of CPAP at night -recommend outpatient pft follow up  AKI -Resolved Admission labs show a creatinine of 2.2 from a baseline of 0.8 -0.9. -Avoid nephrotoxic agents -Monitor creatinine -Holding home PPI  HFpEF, -no longer p -chronic -possible exacerbation leading to arrest -acuteness has resolved, but cautious watching  Patient has a history of HFpEF (EF 60-65%) Chest x-ray shows increased vascular congestion with small left pleural effusion.  Home medications include metoprolol 100 mg twice daily, Lasix 40 mg. -Metoprolol 50 mg twice daily -Follow-up BNP -Strict I's and O's -New Echo showed:  - Left ventricle: The cavity size was normal. Systolic function was   moderately to severely reduced. The estimated ejection fraction   was in the range of 30% to 35%. Severe hypokinesis of the   mid-apicalanteroseptal, anterior, and apical myocardium. Doppler   parameters are consistent with abnormal left ventricular   relaxation (grade 1 diastolic dysfunction).  - Aortic valve: Valve mobility was mildly restricted.  - Mitral valve: Mildly calcified annulus.  - Left atrium: The atrium was moderately dilated.  -Cardiology recommended waiting for catheterization due to low health function  and admission.  Recommended patient follow-up outpatient to continue to monitor heart function.  They predict function will improve.  Decubitus ulcer posterior right thigh, previously stage III  Sacral  Ulcer Per nursing note patient has significant skin breakdown and posterior thighs gluteal area with stage III ulcer present during last admission.   -Wound care was consulted- recommended routine care and is no longer following -Note from wound care states that dressing was changed yesterday and sores are healing well.  Monilla Intertrigo of abdominal pannus and intergluteal cleft -chronic, improved Patient is morbidly obese and has a significant pannus in addition to history of intertrigo. -Topical nystatin  Chronic venous insufficiency  Elephantiasis nostras verrucosaRLE Patient has a significant history of venous insufficiency, no new findings at this time. -removed UNNA boots for inspection from wound care and routine care daily recommended   Diabetes Patient has a history of type 2 diabetes with a previous A1c of 9.3 on 05/19/2018.  -stable, well controlled without home medication -recommend not continuing home meds upon discharge -Monitor CBGs daily  Delirium -resolved  HTN Is on HCTZ and Triameterene, Lasix and metoprolol at home. Blood pressure 120/67 on admission.  - Vitals per floor protocol - Holding home HCTZ-triameterene in setting of AKI  -Consider restarting - Continue metoprolol for tachycardia and monitor BP  Anemia of chronic disease: -Chronic.  Worsening -Hgb 10.7 on admission, 8.6 today -Fecal occult blood test positive - Monitor for signs of bleeding - CBC in am -follow up with GI outpatient for intermittent abdominal discomfort resolved with GI cocktail and FOBT+  HLD Pravastatin 40 mg taken at home. -Continue home medication  GERD Esmeprazole at home. -holding PPI in the setting of AKI  Prostatic Adenocarcinoma Per chart review, urologist is [name]. CT abdomen/pelvis negative for any obvious pathology concerning for metastatic cancer, however prostate was not entirely visualized. This appears to not be concerning for acute  process, recommend further outpatient work-up. PSA elevated at 12.94 on 5/21.  - Outpatient follow-up warranted - continue flomax  Fluids/diet: normal as tolerated PPx: lovenox   Disposition: stable Discharge to SNF. Patient was denied for [facility] facility. He is currently looking at other facilities. Insurance requires a medical review to approve SNF due to his recent use of medical resources. CSW is following up on this and we will have to argue the need for SNF after discharge in order for him to get approval for these facilities.   Laboratory: Recent Labs  Lab 08/04/18 0518 08/05/18 0915 08/06/18 0554  WBC 8.5 8.8 7.7  HGB 8.6* 8.7* 9.1*  HCT 28.8* 28.6* 29.2*  PLT 299 298 278   Recent Labs  Lab 08/04/18 0518 08/05/18 0915 08/06/18 0554  NA 143 141 139  K 3.6 3.6 3.4*  CL 95* 90* 91*  CO2 40* 38* 39*  BUN 5* 13 10  CREATININE 0.71 0.78 0.77  CALCIUM 8.1* 8.1* 8.1*  PROT  --  6.2* 6.6  BILITOT  --  0.9 0.7  ALKPHOS  --  67 66  ALT  --  12 11  AST  --  14* 13*  GLUCOSE 155* 151* 158*    C diff neg Phos 2.5 Mag 1.5 FOBT positive  Imaging/Diagnostic Tests: 2D echo  - Left ventricle: The cavity size was normal. Systolic function was   moderately to severely reduced. The estimated ejection fraction   was in the range of 30% to 35%. Severe hypokinesis of the   mid-apicalanteroseptal, anterior, and apical myocardium. Doppler  parameters are consistent with abnormal left ventricular   relaxation (grade 1 diastolic dysfunction).  - Aortic valve: Valve mobility was mildly restricted.  - Mitral valve: Mildly calcified annulus.  - Left atrium: The atrium was moderately dilated.   Richarda Osmond, DO 08/06/2018, 7:38 AM PGY-1, [place] Intern pager: [phone], text pages welcome

## 2018-08-06 NOTE — Progress Notes (Signed)
CSW spoke with patient's daughter to see if they had chosen a rehab facility. She stated that his first preference is [facility], but CSW alerted her that they did not have beds this week. Patient is deciding between [facility] and [facility]. [facility] contacted CSW to state that patient was under medical review due to patient recently being in rehab and will not have a decision today. CSW updated MD and patient's daughter.   Percell Locus Jamill Wetmore LCSW [phone]

## 2018-08-07 DIAGNOSIS — G4733 Obstructive sleep apnea (adult) (pediatric): Secondary | ICD-10-CM

## 2018-08-07 DIAGNOSIS — Z9989 Dependence on other enabling machines and devices: Secondary | ICD-10-CM

## 2018-08-07 LAB — CBC
HCT: 30.1 % — ABNORMAL LOW (ref 39.0–52.0)
Hemoglobin: 9.4 g/dL — ABNORMAL LOW (ref 13.0–17.0)
MCH: 26 pg (ref 26.0–34.0)
MCHC: 31.2 g/dL (ref 30.0–36.0)
MCV: 83.1 fL (ref 78.0–100.0)
PLATELETS: 258 10*3/uL (ref 150–400)
RBC: 3.62 MIL/uL — ABNORMAL LOW (ref 4.22–5.81)
RDW: 15.9 % — ABNORMAL HIGH (ref 11.5–15.5)
WBC: 8.2 10*3/uL (ref 4.0–10.5)

## 2018-08-07 LAB — COMPREHENSIVE METABOLIC PANEL
ALBUMIN: 2.2 g/dL — AB (ref 3.5–5.0)
ALT: 10 U/L (ref 0–44)
AST: 13 U/L — AB (ref 15–41)
Alkaline Phosphatase: 74 U/L (ref 38–126)
Anion gap: 14 (ref 5–15)
BUN: 9 mg/dL (ref 8–23)
CHLORIDE: 91 mmol/L — AB (ref 98–111)
CO2: 35 mmol/L — ABNORMAL HIGH (ref 22–32)
CREATININE: 0.95 mg/dL (ref 0.61–1.24)
Calcium: 8.4 mg/dL — ABNORMAL LOW (ref 8.9–10.3)
GFR calc Af Amer: >60 mL/min (ref >60)
GLUCOSE: 144 mg/dL — AB (ref 70–99)
Potassium: 3.9 mmol/L (ref 3.5–5.1)
Sodium: 140 mmol/L (ref 135–145)
Total Bilirubin: 0.7 mg/dL (ref 0.3–1.2)
Total Protein: 6.8 g/dL (ref 6.5–8.1)

## 2018-08-07 LAB — GLUCOSE, CAPILLARY
GLUCOSE-CAPILLARY: 155 mg/dL — AB (ref 70–99)
Glucose-Capillary: 145 mg/dL — ABNORMAL HIGH (ref 70–99)
Glucose-Capillary: 162 mg/dL — ABNORMAL HIGH (ref 70–99)
Glucose-Capillary: 152 mg/dL — ABNORMAL HIGH (ref 70–99)

## 2018-08-07 MED ORDER — FERROUS SULFATE 325 (65 FE) MG PO TABS: 325.0000 mg | ORAL_TABLET | Freq: Two times a day (BID) | ORAL | 0 refills | Status: AC

## 2018-08-07 MED ORDER — GERHARDT'S BUTT CREAM
1.0000 | TOPICAL_CREAM | Freq: Three times a day (TID) | CUTANEOUS | 0 refills | Status: DC
Start: 2018-08-07 — End: 2018-10-01

## 2018-08-07 MED ORDER — LOSARTAN POTASSIUM 25 MG PO TABS: 25.0000 mg | ORAL_TABLET | Freq: Every day | ORAL | 0 refills | Status: DC

## 2018-08-07 MED ORDER — TORSEMIDE 20 MG PO TABS: 40.0000 mg | ORAL_TABLET | Freq: Every day | ORAL | 0 refills | Status: DC

## 2018-08-07 MED ORDER — UREA 10 % EX LOTN: TOPICAL_LOTION | Freq: Two times a day (BID) | CUTANEOUS | 0 refills | Status: AC

## 2018-08-07 MED ORDER — METOPROLOL TARTRATE 25 MG PO TABS: 12.5000 mg | ORAL_TABLET | Freq: Two times a day (BID) | ORAL | 0 refills | Status: AC

## 2018-08-07 NOTE — Progress Notes (Signed)
Patient has insurance approval to go to [facility] (only available bed). Patient, Daughter, and MD aware.   Percell Locus Jessamyn Watterson LCSW [phone]

## 2018-08-07 NOTE — Clinical Social Work Placement (Signed)
   CLINICAL SOCIAL WORK PLACEMENT  NOTE  Date:  08/07/2018  Patient Details  Name: [name] MRN: [id number] Date of Birth: 1941-11-15  Clinical Social Work is seeking post-discharge placement for this patient at the [place] level of care (*CSW will initial, date and re-position this form in  chart as items are completed):  Yes   Patient/family provided with [place] Work Department's list of facilities offering this level of care within the geographic area requested by the patient (or if unable, by the patient's family).  Yes   Patient/family informed of their freedom to choose among providers that offer the needed level of care, that participate in Medicare, Medicaid or managed care program needed by the patient, have an available bed and are willing to accept the patient.  Yes   Patient/family informed of [facility]'s ownership interest in [facility] and [facility], as well as of the fact that they are under no obligation to receive care at these facilities.  PASRR submitted to EDS on       PASRR number received on       Existing PASRR number confirmed on 08/05/18     FL2 transmitted to all facilities in geographic area requested by pt/family on 08/05/18     FL2 transmitted to all facilities within larger geographic area on       Patient informed that his/her managed care company has contracts with or will negotiate with certain facilities, including the following:        Yes   Patient/family informed of bed offers received.  Patient chooses bed at [facility]     Physician recommends and patient chooses bed at      Patient to be transferred to [facility] on 08/07/18.  Patient to be transferred to facility by PTAR     Patient family notified on 08/07/18 of transfer.  Name of family member notified:  Daughter, Ivin Booty     PHYSICIAN       Additional Comment:     _______________________________________________ Benard Halsted, [place] 08/07/2018, 4:13 PM

## 2018-08-07 NOTE — Progress Notes (Signed)
Occupational Therapy Treatment Patient Details Name: [name] MRN: [id number] DOB: 09/24/1941 Today's Date: 08/07/2018    History of present illness 77 yo male who presented with weakness and lethargy.  PMH is significant for prostate adenocarcinoma, HFpEF (EF 60-65%), anemia, BLE lymphadema, T2DM, elephantiasis nostras verrucosa RLE, impaired mobility, morbid obesity, HTN, HLD, GERD. Admitted with sepsis, respiratory acidosis and AKI. On 8/2 pt became unresponsive and code blue was initiated at end of PT session.    OT comments  Pt progressing towards acute OT goals. Focus of session was simulating toilet transfer (EOB>recliner). Pt sat EOB several minutes prior to initiating transfer. SPT with +2 mod - max A utilized. Pt using momentum and elevated bed height to power-up to standing position. Pt incontinent of stool during transfer. Pt reporting dizziness once sitting in recliner, eyes mostly closed but responsive to questions. Dizziness improved somewhat with time but bp assessed at 75/50 so utilized maxisky to return pt to supine position in bed. Pt with nurse and 2 NTs present in room at end of OT session. D/c plan remains appropriate.     Follow Up Recommendations  SNF    Equipment Recommendations  Other (comment)(TBD next venue)    Recommendations for Other Services      Precautions / Restrictions Precautions Precautions: Fall Precaution Comments: watch bp Restrictions Weight Bearing Restrictions: No       Mobility Bed Mobility Overal bed mobility: Needs Assistance Bed Mobility: Supine to Sit     Supine to sit: Mod assist;+2 for safety/equipment;HOB elevated     General bed mobility comments: assist to advance LLE over EOB (mattress edging). Pt using bed rail and therapist arm to powerup to EOB position. Therapist also giving some trunk assist.   Transfers Overall transfer level: Needs assistance Equipment used: Rolling walker (2 wheeled) Transfers: Sit to/from  [facility] Sit to Stand: Max assist;+2 physical assistance;From elevated surface Stand pivot transfers: Mod assist;Max assist;+2 physical assistance;From elevated surface       General transfer comment: Pt stood and pivoted to recliner with +2 mod-max A. Incontinent of stool during transfer. Dizzy once sitting in recliner, eyes mostly closed but responsive. Pt reported dizziness improved somewhat. BP assessed at 75/50. Utilized maxisky to return pt to supine position in bed.     Balance Overall balance assessment: Needs assistance Sitting-balance support: Feet supported;No upper extremity supported Sitting balance-Leahy Scale: Fair     Standing balance support: Bilateral upper extremity supported Standing balance-Leahy Scale: Poor Standing balance comment:                             ADL either performed or assessed with clinical judgement   ADL Overall ADL's : Needs assistance/impaired                         Toilet Transfer: Maximal assistance;+2 for physical assistance;Stand-pivot;RW;Requires wide/bariatric Toilet Transfer Details (indicate cue type and reason): simulated with EOB>recliner. Would need bariatric rw and 3n1.            General ADL Comments: Pt completed bed mobility and sat EOB several minutes before completing SPT EOB>recliner. Pt bp assessed at 75/50. Utilized lift equipment to return pt to supine in bed.      Vision       Perception     Praxis      Cognition Arousal/Alertness: Awake/alert Behavior During Therapy: Flat affect Overall Cognitive Status: Within  Functional Limits for tasks assessed Area of Impairment: Safety/judgement                         Safety/Judgement: Decreased awareness of safety;Decreased awareness of deficits              Exercises     Shoulder Instructions       General Comments Incontinent of stool during transfer. Dizzy once in recliner, improved somewhat with  time but bp assessed at 75/50 so utlized maxisky to return pt to supine position in bed. 2 NTs and nurse present at end of OT session.    Pertinent Vitals/ Pain       Pain Assessment: Faces Faces Pain Scale: Hurts a little bit Pain Location: stomach,  Pain Descriptors / Indicators: Aching Pain Intervention(s): Monitored during session;Repositioned  Home Living                                          Prior Functioning/Environment              Frequency  Min 2X/week        Progress Toward Goals  OT Goals(current goals can now be found in the care plan section)  Progress towards OT goals: Progressing toward goals  Acute Rehab OT Goals Patient Stated Goal: "I want to feel better." OT Goal Formulation: With patient Time For Goal Achievement: 08/13/18 Potential to Achieve Goals: Fair ADL Goals Pt Will Perform Grooming: with min guard assist;with supervision;with set-up;sitting Pt Will Perform Upper Body Bathing: with min guard assist;with supervision;with set-up;sitting Pt Will Perform Lower Body Bathing: with max assist;with mod assist;sitting/lateral leans Pt Will Perform Upper Body Dressing: with min guard assist;with supervision;with set-up;sitting Pt Will Transfer to Toilet: with max assist;with +2 assist;squat pivot transfer;stand pivot transfer;bedside commode  Plan Discharge plan remains appropriate    Co-evaluation                 AM-PAC PT "6 Clicks" Daily Activity     Outcome Measure   Help from another person eating meals?: None Help from another person taking care of personal grooming?: A Little Help from another person toileting, which includes using toliet, bedpan, or urinal?: Total Help from another person bathing (including washing, rinsing, drying)?: A Lot Help from another person to put on and taking off regular upper body clothing?: A Lot Help from another person to put on and taking off regular lower body clothing?:  Total 6 Click Score: 13    End of Session Equipment Utilized During Treatment: Oxygen  OT Visit Diagnosis: Unsteadiness on feet (R26.81);Other abnormalities of gait and mobility (R26.89);Muscle weakness (generalized) (M62.81);Pain   Activity Tolerance Patient limited by fatigue;Other (comment)(drop in bp )   Patient Left in bed;with call bell/phone within reach;with nursing/sitter in room   Nurse Communication Mobility status;Other (comment)(bleeding from posterior LLE, low bp + dizziness)        Time: 4174-0814 OT Time Calculation (min): 62 min  Charges: OT General Charges $OT Visit: 1 Visit OT Treatments $Self Care/Home Management : 53-67 mins     Hortencia Pilar 08/07/2018, 11:34 AM

## 2018-08-07 NOTE — Progress Notes (Signed)
Patient discharge teaching given to patient and daughter, including activity, diet, follow-up appoints, and medications. Patient verbalized understanding of all discharge instructions. IV access was d/c'd. Vitals are stable. Skin is intact except as charted in most recent assessments. Pt to be escorted out by [facility], to be transferred to [facility].

## 2018-08-07 NOTE — Progress Notes (Signed)
Called report to [place], Therapist, sports at [facility].  Awaiting PTAR to transfer patient.

## 2018-08-07 NOTE — Progress Notes (Signed)
Patient will DC to: [facility] Anticipated DC date: 08/07/18 Family notified: Daughter, Clinical cytogeneticist by: Bess Kinds   Per MD patient ready for DC to [facility]. RN, patient, patient's family, and facility notified of DC. Discharge Summary sent to facility. RN given number for report [phone]). DC packet on chart. Ambulance transport requested for patient.   CSW signing off.  Cedric Fishman, LCSW Clinical Social Worker [phone]

## 2018-08-08 DIAGNOSIS — R1084 Generalized abdominal pain: Secondary | ICD-10-CM | POA: Diagnosis not present

## 2018-08-08 DIAGNOSIS — L03031 Cellulitis of right toe: Secondary | ICD-10-CM | POA: Diagnosis not present

## 2018-08-08 DIAGNOSIS — K219 Gastro-esophageal reflux disease without esophagitis: Secondary | ICD-10-CM | POA: Diagnosis not present

## 2018-08-08 DIAGNOSIS — J9602 Acute respiratory failure with hypercapnia: Secondary | ICD-10-CM | POA: Diagnosis not present

## 2018-08-08 DIAGNOSIS — D72829 Elevated white blood cell count, unspecified: Secondary | ICD-10-CM | POA: Diagnosis not present

## 2018-08-08 DIAGNOSIS — D649 Anemia, unspecified: Secondary | ICD-10-CM | POA: Diagnosis not present

## 2018-08-08 DIAGNOSIS — F05 Delirium due to known physiological condition: Secondary | ICD-10-CM | POA: Diagnosis not present

## 2018-08-08 DIAGNOSIS — J9621 Acute and chronic respiratory failure with hypoxia: Secondary | ICD-10-CM | POA: Diagnosis not present

## 2018-08-08 DIAGNOSIS — R197 Diarrhea, unspecified: Secondary | ICD-10-CM | POA: Diagnosis not present

## 2018-08-08 DIAGNOSIS — R571 Hypovolemic shock: Secondary | ICD-10-CM | POA: Diagnosis not present

## 2018-08-08 DIAGNOSIS — J9811 Atelectasis: Secondary | ICD-10-CM | POA: Diagnosis not present

## 2018-08-08 DIAGNOSIS — L97229 Non-pressure chronic ulcer of left calf with unspecified severity: Secondary | ICD-10-CM | POA: Diagnosis not present

## 2018-08-08 DIAGNOSIS — N179 Acute kidney failure, unspecified: Secondary | ICD-10-CM | POA: Diagnosis not present

## 2018-08-08 DIAGNOSIS — K922 Gastrointestinal hemorrhage, unspecified: Secondary | ICD-10-CM | POA: Diagnosis not present

## 2018-08-08 DIAGNOSIS — E872 Acidosis: Secondary | ICD-10-CM | POA: Diagnosis not present

## 2018-08-08 DIAGNOSIS — I10 Essential (primary) hypertension: Secondary | ICD-10-CM | POA: Diagnosis not present

## 2018-08-08 DIAGNOSIS — E440 Moderate protein-calorie malnutrition: Secondary | ICD-10-CM | POA: Diagnosis not present

## 2018-08-08 DIAGNOSIS — R0682 Tachypnea, not elsewhere classified: Secondary | ICD-10-CM | POA: Diagnosis not present

## 2018-08-08 DIAGNOSIS — I82611 Acute embolism and thrombosis of superficial veins of right upper extremity: Secondary | ICD-10-CM | POA: Diagnosis not present

## 2018-08-08 DIAGNOSIS — A0472 Enterocolitis due to Clostridium difficile, not specified as recurrent: Secondary | ICD-10-CM | POA: Diagnosis not present

## 2018-08-08 DIAGNOSIS — Z515 Encounter for palliative care: Secondary | ICD-10-CM | POA: Diagnosis not present

## 2018-08-08 DIAGNOSIS — I5023 Acute on chronic systolic (congestive) heart failure: Secondary | ICD-10-CM | POA: Diagnosis not present

## 2018-08-08 DIAGNOSIS — R578 Other shock: Secondary | ICD-10-CM | POA: Diagnosis not present

## 2018-08-08 DIAGNOSIS — E870 Hyperosmolality and hypernatremia: Secondary | ICD-10-CM | POA: Diagnosis not present

## 2018-08-08 DIAGNOSIS — I2609 Other pulmonary embolism with acute cor pulmonale: Secondary | ICD-10-CM | POA: Diagnosis not present

## 2018-08-08 DIAGNOSIS — I82621 Acute embolism and thrombosis of deep veins of right upper extremity: Secondary | ICD-10-CM | POA: Diagnosis not present

## 2018-08-08 DIAGNOSIS — B351 Tinea unguium: Secondary | ICD-10-CM | POA: Diagnosis not present

## 2018-08-08 DIAGNOSIS — R42 Dizziness and giddiness: Secondary | ICD-10-CM | POA: Diagnosis not present

## 2018-08-08 DIAGNOSIS — M6281 Muscle weakness (generalized): Secondary | ICD-10-CM | POA: Diagnosis not present

## 2018-08-08 DIAGNOSIS — R0602 Shortness of breath: Secondary | ICD-10-CM | POA: Diagnosis not present

## 2018-08-08 DIAGNOSIS — Z7409 Other reduced mobility: Secondary | ICD-10-CM | POA: Diagnosis not present

## 2018-08-08 DIAGNOSIS — I5021 Acute systolic (congestive) heart failure: Secondary | ICD-10-CM | POA: Diagnosis not present

## 2018-08-08 DIAGNOSIS — E8779 Other fluid overload: Secondary | ICD-10-CM | POA: Diagnosis not present

## 2018-08-08 DIAGNOSIS — E1165 Type 2 diabetes mellitus with hyperglycemia: Secondary | ICD-10-CM | POA: Diagnosis not present

## 2018-08-08 DIAGNOSIS — I872 Venous insufficiency (chronic) (peripheral): Secondary | ICD-10-CM | POA: Diagnosis not present

## 2018-08-08 DIAGNOSIS — A419 Sepsis, unspecified organism: Secondary | ICD-10-CM | POA: Diagnosis not present

## 2018-08-08 DIAGNOSIS — R031 Nonspecific low blood-pressure reading: Secondary | ICD-10-CM | POA: Diagnosis not present

## 2018-08-08 DIAGNOSIS — E119 Type 2 diabetes mellitus without complications: Secondary | ICD-10-CM | POA: Diagnosis not present

## 2018-08-08 DIAGNOSIS — G9341 Metabolic encephalopathy: Secondary | ICD-10-CM | POA: Diagnosis not present

## 2018-08-08 DIAGNOSIS — E860 Dehydration: Secondary | ICD-10-CM | POA: Diagnosis not present

## 2018-08-08 DIAGNOSIS — R109 Unspecified abdominal pain: Secondary | ICD-10-CM | POA: Diagnosis not present

## 2018-08-08 DIAGNOSIS — E871 Hypo-osmolality and hyponatremia: Secondary | ICD-10-CM | POA: Diagnosis not present

## 2018-08-08 DIAGNOSIS — I959 Hypotension, unspecified: Secondary | ICD-10-CM | POA: Diagnosis not present

## 2018-08-08 DIAGNOSIS — D62 Acute posthemorrhagic anemia: Secondary | ICD-10-CM | POA: Diagnosis not present

## 2018-08-08 DIAGNOSIS — J9622 Acute and chronic respiratory failure with hypercapnia: Secondary | ICD-10-CM | POA: Diagnosis not present

## 2018-08-08 DIAGNOSIS — I739 Peripheral vascular disease, unspecified: Secondary | ICD-10-CM | POA: Diagnosis not present

## 2018-08-08 DIAGNOSIS — I2699 Other pulmonary embolism without acute cor pulmonale: Secondary | ICD-10-CM | POA: Diagnosis not present

## 2018-08-08 DIAGNOSIS — I82C11 Acute embolism and thrombosis of right internal jugular vein: Secondary | ICD-10-CM | POA: Diagnosis not present

## 2018-08-08 DIAGNOSIS — L8992 Pressure ulcer of unspecified site, stage 2: Secondary | ICD-10-CM | POA: Diagnosis not present

## 2018-08-08 DIAGNOSIS — I82B11 Acute embolism and thrombosis of right subclavian vein: Secondary | ICD-10-CM | POA: Diagnosis not present

## 2018-08-08 DIAGNOSIS — L89153 Pressure ulcer of sacral region, stage 3: Secondary | ICD-10-CM | POA: Diagnosis not present

## 2018-08-08 DIAGNOSIS — R5381 Other malaise: Secondary | ICD-10-CM | POA: Diagnosis not present

## 2018-08-08 DIAGNOSIS — K264 Chronic or unspecified duodenal ulcer with hemorrhage: Secondary | ICD-10-CM | POA: Diagnosis not present

## 2018-08-08 DIAGNOSIS — K921 Melena: Secondary | ICD-10-CM | POA: Diagnosis not present

## 2018-08-08 DIAGNOSIS — J9601 Acute respiratory failure with hypoxia: Secondary | ICD-10-CM | POA: Diagnosis not present

## 2018-08-08 DIAGNOSIS — M86661 Other chronic osteomyelitis, right tibia and fibula: Secondary | ICD-10-CM | POA: Diagnosis not present

## 2018-08-08 DIAGNOSIS — I5043 Acute on chronic combined systolic (congestive) and diastolic (congestive) heart failure: Secondary | ICD-10-CM | POA: Diagnosis not present

## 2018-08-08 DIAGNOSIS — S37009A Unspecified injury of unspecified kidney, initial encounter: Secondary | ICD-10-CM | POA: Diagnosis not present

## 2018-08-08 DIAGNOSIS — K269 Duodenal ulcer, unspecified as acute or chronic, without hemorrhage or perforation: Secondary | ICD-10-CM | POA: Diagnosis not present

## 2018-08-08 DIAGNOSIS — R748 Abnormal levels of other serum enzymes: Secondary | ICD-10-CM | POA: Diagnosis not present

## 2018-08-08 DIAGNOSIS — R601 Generalized edema: Secondary | ICD-10-CM | POA: Diagnosis not present

## 2018-08-08 DIAGNOSIS — Z6841 Body Mass Index (BMI) 40.0 and over, adult: Secondary | ICD-10-CM | POA: Diagnosis not present

## 2018-08-10 ENCOUNTER — Other Ambulatory Visit: Payer: Self-pay | Admitting: *Deleted

## 2018-08-10 DIAGNOSIS — R197 Diarrhea, unspecified: Secondary | ICD-10-CM | POA: Diagnosis not present

## 2018-08-10 DIAGNOSIS — R42 Dizziness and giddiness: Secondary | ICD-10-CM | POA: Diagnosis not present

## 2018-08-10 DIAGNOSIS — R031 Nonspecific low blood-pressure reading: Secondary | ICD-10-CM | POA: Diagnosis not present

## 2018-08-10 DIAGNOSIS — I10 Essential (primary) hypertension: Secondary | ICD-10-CM | POA: Diagnosis not present

## 2018-08-10 NOTE — Consult Note (Signed)
[facility] Care Management follow up.  Chart reviewed. Noted Mr. [name] discharged to [facility] on 08/07/18.   Will make referral to [place] for follow up while at [facility].   Marthenia Rolling, MSN-Ed, RN,BSN [facility] Liaison [phone]

## 2018-08-11 ENCOUNTER — Telehealth: Payer: Self-pay | Admitting: Family Medicine

## 2018-08-11 DIAGNOSIS — L89153 Pressure ulcer of sacral region, stage 3: Secondary | ICD-10-CM | POA: Diagnosis not present

## 2018-08-11 DIAGNOSIS — E119 Type 2 diabetes mellitus without complications: Secondary | ICD-10-CM | POA: Diagnosis not present

## 2018-08-11 DIAGNOSIS — I5043 Acute on chronic combined systolic (congestive) and diastolic (congestive) heart failure: Secondary | ICD-10-CM | POA: Diagnosis not present

## 2018-08-11 DIAGNOSIS — I10 Essential (primary) hypertension: Secondary | ICD-10-CM | POA: Diagnosis not present

## 2018-08-11 NOTE — Telephone Encounter (Signed)
Reviewed FMLA form and placed in PCP's box for completion. No other action was required by clinic staff.  Ozella Almond, [place]

## 2018-08-11 NOTE — Telephone Encounter (Signed)
Patient's daughter brought FMLA form to be filled out by pcp. Patient has not been seen in office since 05/08/17. Called left v/msg for daughter in regards to this.

## 2018-08-12 ENCOUNTER — Other Ambulatory Visit: Payer: Self-pay | Admitting: *Deleted

## 2018-08-12 NOTE — Patient Outreach (Signed)
[facility]) Care Management  08/12/2018  [name] 11-06-1941 [id number]   Met with [name] dcp at facility. Informed them that patient is active with [facility] care management for community services upon discharge. [facility] LCSW, [name] will follow patient while at facility.  They report patient does have some nursing and wound care not just therapy and this may affect his length of stay.   RNCM will collaborate with [facility] UM and CM care team as indicated.   [name]. Joel Purser, RN, BSN, [place] [phone]) Business Cell  [phone]) Toll Free Office

## 2018-08-12 NOTE — Patient Outreach (Signed)
[facility]) Care Management  08/12/2018  [name] [birth year] [id number]   CSW received referral from [name], [name] that patient discharged [facility] on 08/07/18. Patient was previously at [facility] in June following hospitalization for CHF & discharged home with home health. Patient was readmitted to [facility] on 07/28/18 with sepsis secondary to UTI/pneumonia/soft tissue infection. Patient then discharged [facility] on 08/07/18. [facility] consent was signed with [name] 06/02/18. CSW met with patient at [facility] (room #: 112) who reports that he lives alone in handicapped accessible independent living facility ([facility] on [address] with an electrical wheelchair, crutches, rolling walker & hospital bed. Patient reports that he is looking forward to returning home eventually, but feels that he will be here for a few more weeks before he regains his strength. CSW will follow-up with patient in 2 weeks.    [name], LCSW [facility]  Clinical Social Worker cell #: [phone]

## 2018-08-12 NOTE — Telephone Encounter (Signed)
Daughter stated she received a call yesterday about FMLA form. She understood the messge to be the form could not be filled out. Please advise

## 2018-08-14 NOTE — Telephone Encounter (Signed)
LMOVM of dgt that form was ready for pickup. Fleeger, Salome Spotted, CMA

## 2018-08-18 DIAGNOSIS — R5381 Other malaise: Secondary | ICD-10-CM | POA: Diagnosis not present

## 2018-08-18 DIAGNOSIS — R0602 Shortness of breath: Secondary | ICD-10-CM | POA: Diagnosis not present

## 2018-08-18 DIAGNOSIS — E860 Dehydration: Secondary | ICD-10-CM | POA: Diagnosis not present

## 2018-08-18 DIAGNOSIS — R031 Nonspecific low blood-pressure reading: Secondary | ICD-10-CM | POA: Diagnosis not present

## 2018-08-19 ENCOUNTER — Inpatient Hospital Stay (HOSPITAL_COMMUNITY)
Admission: EM | Admit: 2018-08-19 | Discharge: 2018-10-01 | DRG: 981 | Disposition: A | Discharge status: Skilled Nursing Facility | Payer: PPO | Attending: Family Medicine | Admitting: Family Medicine

## 2018-08-19 ENCOUNTER — Telehealth: Payer: Self-pay

## 2018-08-19 ENCOUNTER — Encounter (HOSPITAL_COMMUNITY): Payer: Self-pay | Admitting: Emergency Medicine

## 2018-08-19 ENCOUNTER — Other Ambulatory Visit: Payer: Self-pay

## 2018-08-19 ENCOUNTER — Emergency Department (HOSPITAL_COMMUNITY): Payer: PPO

## 2018-08-19 DIAGNOSIS — E876 Hypokalemia: Secondary | ICD-10-CM | POA: Diagnosis not present

## 2018-08-19 DIAGNOSIS — I82611 Acute embolism and thrombosis of superficial veins of right upper extremity: Secondary | ICD-10-CM | POA: Diagnosis not present

## 2018-08-19 DIAGNOSIS — Z888 Allergy status to other drugs, medicaments and biological substances status: Secondary | ICD-10-CM

## 2018-08-19 DIAGNOSIS — E440 Moderate protein-calorie malnutrition: Secondary | ICD-10-CM | POA: Diagnosis not present

## 2018-08-19 DIAGNOSIS — I10 Essential (primary) hypertension: Secondary | ICD-10-CM | POA: Diagnosis not present

## 2018-08-19 DIAGNOSIS — E872 Acidosis: Secondary | ICD-10-CM | POA: Diagnosis present

## 2018-08-19 DIAGNOSIS — D62 Acute posthemorrhagic anemia: Secondary | ICD-10-CM | POA: Diagnosis present

## 2018-08-19 DIAGNOSIS — G4733 Obstructive sleep apnea (adult) (pediatric): Secondary | ICD-10-CM | POA: Diagnosis present

## 2018-08-19 DIAGNOSIS — Z4659 Encounter for fitting and adjustment of other gastrointestinal appliance and device: Secondary | ICD-10-CM

## 2018-08-19 DIAGNOSIS — R5383 Other fatigue: Secondary | ICD-10-CM

## 2018-08-19 DIAGNOSIS — R578 Other shock: Secondary | ICD-10-CM | POA: Diagnosis not present

## 2018-08-19 DIAGNOSIS — A0472 Enterocolitis due to Clostridium difficile, not specified as recurrent: Secondary | ICD-10-CM | POA: Diagnosis not present

## 2018-08-19 DIAGNOSIS — R110 Nausea: Secondary | ICD-10-CM | POA: Diagnosis not present

## 2018-08-19 DIAGNOSIS — R601 Generalized edema: Secondary | ICD-10-CM | POA: Diagnosis not present

## 2018-08-19 DIAGNOSIS — R269 Unspecified abnormalities of gait and mobility: Secondary | ICD-10-CM | POA: Diagnosis present

## 2018-08-19 DIAGNOSIS — M86661 Other chronic osteomyelitis, right tibia and fibula: Secondary | ICD-10-CM | POA: Diagnosis present

## 2018-08-19 DIAGNOSIS — R0689 Other abnormalities of breathing: Secondary | ICD-10-CM | POA: Diagnosis not present

## 2018-08-19 DIAGNOSIS — I82621 Acute embolism and thrombosis of deep veins of right upper extremity: Secondary | ICD-10-CM | POA: Diagnosis not present

## 2018-08-19 DIAGNOSIS — Z66 Do not resuscitate: Secondary | ICD-10-CM | POA: Diagnosis not present

## 2018-08-19 DIAGNOSIS — R0602 Shortness of breath: Secondary | ICD-10-CM | POA: Diagnosis not present

## 2018-08-19 DIAGNOSIS — R197 Diarrhea, unspecified: Secondary | ICD-10-CM | POA: Diagnosis not present

## 2018-08-19 DIAGNOSIS — G9341 Metabolic encephalopathy: Secondary | ICD-10-CM | POA: Diagnosis not present

## 2018-08-19 DIAGNOSIS — Z96643 Presence of artificial hip joint, bilateral: Secondary | ICD-10-CM | POA: Diagnosis present

## 2018-08-19 DIAGNOSIS — E8779 Other fluid overload: Secondary | ICD-10-CM | POA: Diagnosis not present

## 2018-08-19 DIAGNOSIS — J9811 Atelectasis: Secondary | ICD-10-CM | POA: Diagnosis not present

## 2018-08-19 DIAGNOSIS — L97229 Non-pressure chronic ulcer of left calf with unspecified severity: Secondary | ICD-10-CM | POA: Diagnosis present

## 2018-08-19 DIAGNOSIS — Z515 Encounter for palliative care: Secondary | ICD-10-CM | POA: Diagnosis not present

## 2018-08-19 DIAGNOSIS — J9602 Acute respiratory failure with hypercapnia: Secondary | ICD-10-CM | POA: Diagnosis not present

## 2018-08-19 DIAGNOSIS — F05 Delirium due to known physiological condition: Secondary | ICD-10-CM | POA: Diagnosis not present

## 2018-08-19 DIAGNOSIS — I82B11 Acute embolism and thrombosis of right subclavian vein: Secondary | ICD-10-CM | POA: Diagnosis not present

## 2018-08-19 DIAGNOSIS — R109 Unspecified abdominal pain: Secondary | ICD-10-CM | POA: Diagnosis not present

## 2018-08-19 DIAGNOSIS — N179 Acute kidney failure, unspecified: Secondary | ICD-10-CM

## 2018-08-19 DIAGNOSIS — M79606 Pain in leg, unspecified: Secondary | ICD-10-CM | POA: Diagnosis not present

## 2018-08-19 DIAGNOSIS — I82C11 Acute embolism and thrombosis of right internal jugular vein: Secondary | ICD-10-CM | POA: Diagnosis not present

## 2018-08-19 DIAGNOSIS — N400 Enlarged prostate without lower urinary tract symptoms: Secondary | ICD-10-CM | POA: Diagnosis present

## 2018-08-19 DIAGNOSIS — D649 Anemia, unspecified: Secondary | ICD-10-CM | POA: Diagnosis not present

## 2018-08-19 DIAGNOSIS — R509 Fever, unspecified: Secondary | ICD-10-CM

## 2018-08-19 DIAGNOSIS — M6281 Muscle weakness (generalized): Secondary | ICD-10-CM | POA: Diagnosis not present

## 2018-08-19 DIAGNOSIS — I8291 Chronic embolism and thrombosis of unspecified vein: Secondary | ICD-10-CM | POA: Diagnosis not present

## 2018-08-19 DIAGNOSIS — K269 Duodenal ulcer, unspecified as acute or chronic, without hemorrhage or perforation: Secondary | ICD-10-CM | POA: Diagnosis not present

## 2018-08-19 DIAGNOSIS — R571 Hypovolemic shock: Secondary | ICD-10-CM | POA: Diagnosis present

## 2018-08-19 DIAGNOSIS — I5043 Acute on chronic combined systolic (congestive) and diastolic (congestive) heart failure: Secondary | ICD-10-CM | POA: Diagnosis not present

## 2018-08-19 DIAGNOSIS — D72829 Elevated white blood cell count, unspecified: Secondary | ICD-10-CM

## 2018-08-19 DIAGNOSIS — R0609 Other forms of dyspnea: Secondary | ICD-10-CM | POA: Diagnosis not present

## 2018-08-19 DIAGNOSIS — E119 Type 2 diabetes mellitus without complications: Secondary | ICD-10-CM | POA: Diagnosis not present

## 2018-08-19 DIAGNOSIS — J9690 Respiratory failure, unspecified, unspecified whether with hypoxia or hypercapnia: Secondary | ICD-10-CM | POA: Diagnosis not present

## 2018-08-19 DIAGNOSIS — Z86010 Personal history of colonic polyps: Secondary | ICD-10-CM

## 2018-08-19 DIAGNOSIS — K2950 Unspecified chronic gastritis without bleeding: Secondary | ICD-10-CM | POA: Diagnosis not present

## 2018-08-19 DIAGNOSIS — J9622 Acute and chronic respiratory failure with hypercapnia: Secondary | ICD-10-CM | POA: Diagnosis not present

## 2018-08-19 DIAGNOSIS — L8990 Pressure ulcer of unspecified site, unspecified stage: Secondary | ICD-10-CM | POA: Diagnosis not present

## 2018-08-19 DIAGNOSIS — I2699 Other pulmonary embolism without acute cor pulmonale: Secondary | ICD-10-CM | POA: Diagnosis not present

## 2018-08-19 DIAGNOSIS — E870 Hyperosmolality and hypernatremia: Secondary | ICD-10-CM | POA: Diagnosis present

## 2018-08-19 DIAGNOSIS — Z8546 Personal history of malignant neoplasm of prostate: Secondary | ICD-10-CM

## 2018-08-19 DIAGNOSIS — Z7951 Long term (current) use of inhaled steroids: Secondary | ICD-10-CM

## 2018-08-19 DIAGNOSIS — I255 Ischemic cardiomyopathy: Secondary | ICD-10-CM | POA: Diagnosis present

## 2018-08-19 DIAGNOSIS — J9621 Acute and chronic respiratory failure with hypoxia: Secondary | ICD-10-CM | POA: Diagnosis not present

## 2018-08-19 DIAGNOSIS — S82209A Unspecified fracture of shaft of unspecified tibia, initial encounter for closed fracture: Secondary | ICD-10-CM | POA: Diagnosis not present

## 2018-08-19 DIAGNOSIS — J90 Pleural effusion, not elsewhere classified: Secondary | ICD-10-CM | POA: Diagnosis not present

## 2018-08-19 DIAGNOSIS — K264 Chronic or unspecified duodenal ulcer with hemorrhage: Secondary | ICD-10-CM | POA: Diagnosis not present

## 2018-08-19 DIAGNOSIS — L89153 Pressure ulcer of sacral region, stage 3: Secondary | ICD-10-CM | POA: Diagnosis present

## 2018-08-19 DIAGNOSIS — R1084 Generalized abdominal pain: Secondary | ICD-10-CM | POA: Diagnosis not present

## 2018-08-19 DIAGNOSIS — D638 Anemia in other chronic diseases classified elsewhere: Secondary | ICD-10-CM | POA: Diagnosis present

## 2018-08-19 DIAGNOSIS — E871 Hypo-osmolality and hyponatremia: Secondary | ICD-10-CM | POA: Diagnosis not present

## 2018-08-19 DIAGNOSIS — Z7401 Bed confinement status: Secondary | ICD-10-CM

## 2018-08-19 DIAGNOSIS — I878 Other specified disorders of veins: Secondary | ICD-10-CM | POA: Diagnosis present

## 2018-08-19 DIAGNOSIS — Z4682 Encounter for fitting and adjustment of non-vascular catheter: Secondary | ICD-10-CM | POA: Diagnosis not present

## 2018-08-19 DIAGNOSIS — Z8674 Personal history of sudden cardiac arrest: Secondary | ICD-10-CM

## 2018-08-19 DIAGNOSIS — Z452 Encounter for adjustment and management of vascular access device: Secondary | ICD-10-CM

## 2018-08-19 DIAGNOSIS — J9600 Acute respiratory failure, unspecified whether with hypoxia or hypercapnia: Secondary | ICD-10-CM

## 2018-08-19 DIAGNOSIS — E1165 Type 2 diabetes mellitus with hyperglycemia: Secondary | ICD-10-CM | POA: Diagnosis not present

## 2018-08-19 DIAGNOSIS — E860 Dehydration: Secondary | ICD-10-CM | POA: Diagnosis not present

## 2018-08-19 DIAGNOSIS — I5021 Acute systolic (congestive) heart failure: Secondary | ICD-10-CM | POA: Diagnosis not present

## 2018-08-19 DIAGNOSIS — K314 Gastric diverticulum: Secondary | ICD-10-CM | POA: Diagnosis present

## 2018-08-19 DIAGNOSIS — K8020 Calculus of gallbladder without cholecystitis without obstruction: Secondary | ICD-10-CM | POA: Diagnosis not present

## 2018-08-19 DIAGNOSIS — I2609 Other pulmonary embolism with acute cor pulmonale: Secondary | ICD-10-CM | POA: Diagnosis not present

## 2018-08-19 DIAGNOSIS — R42 Dizziness and giddiness: Secondary | ICD-10-CM | POA: Diagnosis not present

## 2018-08-19 DIAGNOSIS — T82594A Other mechanical complication of infusion catheter, initial encounter: Secondary | ICD-10-CM

## 2018-08-19 DIAGNOSIS — E1169 Type 2 diabetes mellitus with other specified complication: Secondary | ICD-10-CM | POA: Diagnosis present

## 2018-08-19 DIAGNOSIS — K2980 Duodenitis without bleeding: Secondary | ICD-10-CM | POA: Diagnosis not present

## 2018-08-19 DIAGNOSIS — L280 Lichen simplex chronicus: Secondary | ICD-10-CM | POA: Diagnosis present

## 2018-08-19 DIAGNOSIS — J189 Pneumonia, unspecified organism: Secondary | ICD-10-CM

## 2018-08-19 DIAGNOSIS — M79609 Pain in unspecified limb: Secondary | ICD-10-CM | POA: Diagnosis not present

## 2018-08-19 DIAGNOSIS — E878 Other disorders of electrolyte and fluid balance, not elsewhere classified: Secondary | ICD-10-CM | POA: Diagnosis present

## 2018-08-19 DIAGNOSIS — I872 Venous insufficiency (chronic) (peripheral): Secondary | ICD-10-CM | POA: Diagnosis present

## 2018-08-19 DIAGNOSIS — I272 Pulmonary hypertension, unspecified: Secondary | ICD-10-CM | POA: Diagnosis present

## 2018-08-19 DIAGNOSIS — I700 Atherosclerosis of aorta: Secondary | ICD-10-CM | POA: Diagnosis present

## 2018-08-19 DIAGNOSIS — E8770 Fluid overload, unspecified: Secondary | ICD-10-CM | POA: Diagnosis not present

## 2018-08-19 DIAGNOSIS — K449 Diaphragmatic hernia without obstruction or gangrene: Secondary | ICD-10-CM | POA: Diagnosis present

## 2018-08-19 DIAGNOSIS — R0682 Tachypnea, not elsewhere classified: Secondary | ICD-10-CM

## 2018-08-19 DIAGNOSIS — R5381 Other malaise: Secondary | ICD-10-CM | POA: Diagnosis present

## 2018-08-19 DIAGNOSIS — Z6841 Body Mass Index (BMI) 40.0 and over, adult: Secondary | ICD-10-CM

## 2018-08-19 DIAGNOSIS — R609 Edema, unspecified: Secondary | ICD-10-CM | POA: Diagnosis not present

## 2018-08-19 DIAGNOSIS — Z79891 Long term (current) use of opiate analgesic: Secondary | ICD-10-CM

## 2018-08-19 DIAGNOSIS — E2740 Unspecified adrenocortical insufficiency: Secondary | ICD-10-CM | POA: Diagnosis present

## 2018-08-19 DIAGNOSIS — L89892 Pressure ulcer of other site, stage 2: Secondary | ICD-10-CM | POA: Diagnosis present

## 2018-08-19 DIAGNOSIS — Z9989 Dependence on other enabling machines and devices: Secondary | ICD-10-CM | POA: Diagnosis not present

## 2018-08-19 DIAGNOSIS — Z981 Arthrodesis status: Secondary | ICD-10-CM

## 2018-08-19 DIAGNOSIS — K219 Gastro-esophageal reflux disease without esophagitis: Secondary | ICD-10-CM | POA: Diagnosis present

## 2018-08-19 DIAGNOSIS — L8992 Pressure ulcer of unspecified site, stage 2: Secondary | ICD-10-CM | POA: Diagnosis not present

## 2018-08-19 DIAGNOSIS — I959 Hypotension, unspecified: Secondary | ICD-10-CM | POA: Diagnosis not present

## 2018-08-19 DIAGNOSIS — Z7982 Long term (current) use of aspirin: Secondary | ICD-10-CM

## 2018-08-19 DIAGNOSIS — I351 Nonrheumatic aortic (valve) insufficiency: Secondary | ICD-10-CM | POA: Diagnosis not present

## 2018-08-19 DIAGNOSIS — I5023 Acute on chronic systolic (congestive) heart failure: Secondary | ICD-10-CM | POA: Diagnosis not present

## 2018-08-19 DIAGNOSIS — I110 Hypertensive heart disease with heart failure: Secondary | ICD-10-CM | POA: Diagnosis present

## 2018-08-19 DIAGNOSIS — K922 Gastrointestinal hemorrhage, unspecified: Secondary | ICD-10-CM | POA: Diagnosis not present

## 2018-08-19 DIAGNOSIS — Z8249 Family history of ischemic heart disease and other diseases of the circulatory system: Secondary | ICD-10-CM

## 2018-08-19 DIAGNOSIS — K9420 Gastrostomy complication, unspecified: Secondary | ICD-10-CM | POA: Diagnosis not present

## 2018-08-19 DIAGNOSIS — R4182 Altered mental status, unspecified: Secondary | ICD-10-CM | POA: Diagnosis not present

## 2018-08-19 DIAGNOSIS — R579 Shock, unspecified: Secondary | ICD-10-CM | POA: Diagnosis not present

## 2018-08-19 DIAGNOSIS — K921 Melena: Secondary | ICD-10-CM | POA: Diagnosis not present

## 2018-08-19 DIAGNOSIS — J302 Other seasonal allergic rhinitis: Secondary | ICD-10-CM | POA: Diagnosis present

## 2018-08-19 DIAGNOSIS — Z7409 Other reduced mobility: Secondary | ICD-10-CM | POA: Diagnosis not present

## 2018-08-19 DIAGNOSIS — R008 Other abnormalities of heart beat: Secondary | ICD-10-CM | POA: Diagnosis not present

## 2018-08-19 DIAGNOSIS — R748 Abnormal levels of other serum enzymes: Secondary | ICD-10-CM | POA: Diagnosis not present

## 2018-08-19 DIAGNOSIS — J9601 Acute respiratory failure with hypoxia: Secondary | ICD-10-CM

## 2018-08-19 DIAGNOSIS — Z8619 Personal history of other infectious and parasitic diseases: Secondary | ICD-10-CM

## 2018-08-19 DIAGNOSIS — E1142 Type 2 diabetes mellitus with diabetic polyneuropathy: Secondary | ICD-10-CM | POA: Diagnosis present

## 2018-08-19 DIAGNOSIS — I440 Atrioventricular block, first degree: Secondary | ICD-10-CM | POA: Diagnosis present

## 2018-08-19 DIAGNOSIS — R140 Abdominal distension (gaseous): Secondary | ICD-10-CM | POA: Diagnosis not present

## 2018-08-19 DIAGNOSIS — J449 Chronic obstructive pulmonary disease, unspecified: Secondary | ICD-10-CM | POA: Diagnosis not present

## 2018-08-19 DIAGNOSIS — E785 Hyperlipidemia, unspecified: Secondary | ICD-10-CM | POA: Diagnosis present

## 2018-08-19 DIAGNOSIS — M7989 Other specified soft tissue disorders: Secondary | ICD-10-CM | POA: Diagnosis not present

## 2018-08-19 DIAGNOSIS — M2550 Pain in unspecified joint: Secondary | ICD-10-CM | POA: Diagnosis not present

## 2018-08-19 DIAGNOSIS — R031 Nonspecific low blood-pressure reading: Secondary | ICD-10-CM | POA: Diagnosis not present

## 2018-08-19 DIAGNOSIS — K2970 Gastritis, unspecified, without bleeding: Secondary | ICD-10-CM | POA: Diagnosis present

## 2018-08-19 DIAGNOSIS — I82891 Chronic embolism and thrombosis of other specified veins: Secondary | ICD-10-CM | POA: Diagnosis not present

## 2018-08-19 HISTORY — DX: Osteitis deformans of other bones: M88.88

## 2018-08-19 LAB — C DIFFICILE QUICK SCREEN W PCR REFLEX
C DIFFICILE (CDIFF) TOXIN: NEGATIVE
C Diff antigen: POSITIVE — AB

## 2018-08-19 LAB — COMPREHENSIVE METABOLIC PANEL
ALK PHOS: 65 U/L (ref 38–126)
ALT: 8 U/L (ref 0–44)
AST: 9 U/L — AB (ref 15–41)
Albumin: 2.1 g/dL — ABNORMAL LOW (ref 3.5–5.0)
Anion gap: 13 (ref 5–15)
BILIRUBIN TOTAL: 0.3 mg/dL (ref 0.3–1.2)
BUN: 109 mg/dL — AB (ref 8–23)
CALCIUM: 8.1 mg/dL — AB (ref 8.9–10.3)
CO2: 34 mmol/L — ABNORMAL HIGH (ref 22–32)
CREATININE: 2.15 mg/dL — AB (ref 0.61–1.24)
Chloride: 96 mmol/L — ABNORMAL LOW (ref 98–111)
GFR calc Af Amer: 32 mL/min — ABNORMAL LOW (ref >60)
GFR calc non Af Amer: 28 mL/min — ABNORMAL LOW (ref >60)
Glucose, Bld: 205 mg/dL — ABNORMAL HIGH (ref 70–99)
POTASSIUM: 3.9 mmol/L (ref 3.5–5.1)
Sodium: 143 mmol/L (ref 135–145)
TOTAL PROTEIN: 6.4 g/dL — AB (ref 6.5–8.1)

## 2018-08-19 LAB — CBC WITH DIFFERENTIAL/PLATELET
BASOS ABS: 0 10*3/uL (ref 0.0–0.1)
BASOS PCT: 0 %
EOS PCT: 3 %
Eosinophils Absolute: 0.3 10*3/uL (ref 0.0–0.7)
HEMATOCRIT: 21.3 % — AB (ref 39.0–52.0)
Hemoglobin: 6.7 g/dL — CL (ref 13.0–17.0)
LYMPHS PCT: 12 %
Lymphs Abs: 1.5 10*3/uL (ref 0.7–4.0)
MCH: 26.4 pg (ref 26.0–34.0)
MCHC: 31.5 g/dL (ref 30.0–36.0)
MCV: 83.9 fL (ref 78.0–100.0)
MONO ABS: 0.4 10*3/uL (ref 0.1–1.0)
MONOS PCT: 4 %
NEUTROS ABS: 10.3 10*3/uL — AB (ref 1.7–7.7)
Neutrophils Relative %: 81 %
PLATELETS: 311 10*3/uL (ref 150–400)
RBC: 2.54 MIL/uL — ABNORMAL LOW (ref 4.22–5.81)
RDW: 17.1 % — AB (ref 11.5–15.5)
WBC: 12.6 10*3/uL — ABNORMAL HIGH (ref 4.0–10.5)

## 2018-08-19 LAB — CBC
HCT: 25.1 % — ABNORMAL LOW (ref 39.0–52.0)
HEMOGLOBIN: 8 g/dL — AB (ref 13.0–17.0)
MCH: 26.7 pg (ref 26.0–34.0)
MCHC: 31.9 g/dL (ref 30.0–36.0)
MCV: 83.7 fL (ref 78.0–100.0)
Platelets: 136 10*3/uL — ABNORMAL LOW (ref 150–400)
RBC: 3 MIL/uL — ABNORMAL LOW (ref 4.22–5.81)
RDW: 16 % — ABNORMAL HIGH (ref 11.5–15.5)
WBC: 14.6 10*3/uL — AB (ref 4.0–10.5)

## 2018-08-19 LAB — I-STAT CHEM 8, ED
BUN: 111 mg/dL — AB (ref 8–23)
CREATININE: 2.1 mg/dL — AB (ref 0.61–1.24)
Calcium, Ion: 1.08 mmol/L — ABNORMAL LOW (ref 1.15–1.40)
Chloride: 91 mmol/L — ABNORMAL LOW (ref 98–111)
Glucose, Bld: 202 mg/dL — ABNORMAL HIGH (ref 70–99)
HEMATOCRIT: 25 % — AB (ref 39.0–52.0)
HEMOGLOBIN: 8.5 g/dL — AB (ref 13.0–17.0)
Potassium: 3.7 mmol/L (ref 3.5–5.1)
Sodium: 141 mmol/L (ref 135–145)
TCO2: 35 mmol/L — AB (ref 22–32)

## 2018-08-19 LAB — MRSA PCR SCREENING: MRSA BY PCR: NEGATIVE

## 2018-08-19 LAB — I-STAT CG4 LACTIC ACID, ED: Lactic Acid, Venous: 1.2 mmol/L (ref 0.5–1.9)

## 2018-08-19 LAB — LIPASE, BLOOD: Lipase: 21 U/L (ref 11–51)

## 2018-08-19 LAB — PREPARE RBC (CROSSMATCH)

## 2018-08-19 LAB — CLOSTRIDIUM DIFFICILE BY PCR, REFLEXED: CDIFFPCR: POSITIVE — AB

## 2018-08-19 LAB — CBG MONITORING, ED: Glucose-Capillary: 148 mg/dL — ABNORMAL HIGH (ref 70–99)

## 2018-08-19 LAB — PROTIME-INR
INR: 1.18
Prothrombin Time: 14.9 seconds (ref 11.4–15.2)

## 2018-08-19 LAB — POC OCCULT BLOOD, ED: Fecal Occult Bld: POSITIVE — AB

## 2018-08-19 MED ORDER — SODIUM CHLORIDE 0.9 % IV BOLUS
1000.0000 mL | Freq: Once | INTRAVENOUS | Status: AC
  Administered 2018-08-19: 1000 mL via INTRAVENOUS

## 2018-08-19 MED ORDER — SODIUM CHLORIDE 0.9 % IV SOLN
10.0000 mL/h | Freq: Once | INTRAVENOUS | Status: AC
  Administered 2018-08-19: 10 mL/h via INTRAVENOUS

## 2018-08-19 MED ORDER — PANTOPRAZOLE SODIUM 40 MG IV SOLR
40.0000 mg | Freq: Once | INTRAVENOUS | Status: AC
  Administered 2018-08-19: 40 mg via INTRAVENOUS
  Filled 2018-08-19: qty 40

## 2018-08-19 MED ORDER — SODIUM CHLORIDE 0.9 % IV SOLN
INTRAVENOUS | Status: DC
  Administered 2018-08-19 – 2018-08-21 (×5): via INTRAVENOUS

## 2018-08-19 MED ORDER — ORAL CARE MOUTH RINSE
15.0000 mL | Freq: Two times a day (BID) | OROMUCOSAL | Status: DC
  Administered 2018-08-20 – 2018-10-01 (×45): 15 mL via OROMUCOSAL

## 2018-08-19 MED ORDER — SODIUM CHLORIDE 0.9% IV SOLUTION
Freq: Once | INTRAVENOUS | Status: AC
  Administered 2018-08-19: 16:00:00 via INTRAVENOUS

## 2018-08-19 MED ORDER — LACTATED RINGERS IV BOLUS
1000.0000 mL | Freq: Once | INTRAVENOUS | Status: AC
  Administered 2018-08-19: 1000 mL via INTRAVENOUS

## 2018-08-19 MED ORDER — PANTOPRAZOLE SODIUM 40 MG IV SOLR
40.0000 mg | Freq: Two times a day (BID) | INTRAVENOUS | Status: DC
  Administered 2018-08-19 – 2018-08-20 (×3): 40 mg via INTRAVENOUS
  Filled 2018-08-19 (×4): qty 40

## 2018-08-19 MED ORDER — SODIUM CHLORIDE 0.9 % IV SOLN: 250.0000 mL | INTRAVENOUS | Status: DC | PRN

## 2018-08-19 MED ORDER — CHLORHEXIDINE GLUCONATE 0.12 % MT SOLN
15.0000 mL | Freq: Two times a day (BID) | OROMUCOSAL | Status: DC
  Administered 2018-08-19 – 2018-10-01 (×76): 15 mL via OROMUCOSAL
  Filled 2018-08-19 (×72): qty 15

## 2018-08-19 MED ORDER — VANCOMYCIN 50 MG/ML ORAL SOLUTION
125.0000 mg | Freq: Four times a day (QID) | ORAL | Status: AC
  Administered 2018-08-19 – 2018-08-29 (×38): 125 mg via ORAL
  Filled 2018-08-19 (×43): qty 2.5

## 2018-08-19 NOTE — Telephone Encounter (Signed)
Larene Beach with [facility] calling to follow up on order sent to Dr. Yisroel Ramming on 08/02/68 for an O2 certificate of medical necessity. Calling to verify was received- and if not please return her call so she can resend at [phone]. Fax number to send form [phone] Wallace Cullens, RN

## 2018-08-19 NOTE — Progress Notes (Signed)
eLink Physician-Brief Progress Note Patient Name: [name] DOB: 02-14-1941 MRN: [id number]   Date of Service  08/19/2018  HPI/Events of Note  Hypotension - BP = 69/37 with MAP = 51.   eICU Interventions  Will order: 1. Bolus with 0.9 NaCl 1 liter IV over 1 hour now.  2. CBC now.      Intervention Category Major Interventions: Hypotension - evaluation and management  [name] Cornelia Copa 08/19/2018, 8:27 PM

## 2018-08-19 NOTE — ED Notes (Signed)
Date and time results received: 08/19/18 1550  Test: ETOH  Critical Value: 6.7 Name of Provider Notified: Dr. Ronnald Nian  Orders Received? Or Actions Taken?: Will continue to monitor and await for new orders.

## 2018-08-19 NOTE — Telephone Encounter (Signed)
LMOVM for shannon to fax order again. Deseree Kennon Holter, CMA

## 2018-08-19 NOTE — Telephone Encounter (Signed)
Not yet received.  [name], [place], PGY-3

## 2018-08-19 NOTE — ED Notes (Signed)
ED Provider at bedside. 

## 2018-08-19 NOTE — ED Provider Notes (Signed)
[facility] DEPT Provider Note   CSN: [id number] Arrival date & time: 08/19/18  1433     History   Chief Complaint Chief Complaint  Patient presents with  . Rectal Bleeding    HPI [name] is a 76 y.o. male.  The history is provided by the patient and the EMS personnel.  Rectal Bleeding  Quality:  Black and tarry Amount:  Moderate Timing:  Constant Chronicity:  New Context: not diarrhea and not hemorrhoids   Context comment:  Patient found to have low pressure and dark stools at living facility today, patient with possible diagnosis of Cdiff today. Similar prior episodes: no   Relieved by:  Nothing Worsened by:  Nothing Associated symptoms: dizziness and light-headedness   Associated symptoms: no abdominal pain, no epistaxis, no fever, no hematemesis, no loss of consciousness, no recent illness and no vomiting   Risk factors: no liver disease     Past Medical History:  Diagnosis Date  . Chronic osteomyelitis of lower leg (HCC) 11/20/2010   Chronic osteoarthritis of right lower leg-10/03/10 note by Dr. Harvie Heck scanned of this visit under media tab. Dr. Sharol Given states that this is chronic osteomyelitis, only cure would be amputation. Patient was not agreeable to this at his visit with Dr. Sharol Given. Patient elected to apply Bactroban cream, Dr. Sharol Given and time of that visit also prescribe doxycycline. Dr. Sharol Given stay the patient she come to the [place] 02/26/2007   Qualifier: Diagnosis of  By: Erling Cruz  MD, [place]    . Complication of anesthesia   . Diabetes mellitus   . Diabetic peripheral neuropathy associated with type 2 diabetes mellitus ([place]) 02/07/2014  . GERD (gastroesophageal reflux disease)   . Grade II diastolic dysfunction   . Heart murmur    last 2D Echo -03/31/08  . HIATAL HERNIA WITH REFLUX 09/29/2006   Qualifier: Diagnosis of  By: Erling Cruz  MD, MELISSA    . HIP REPLACEMENT, BILATERAL, HX OF 05/12/2007   Qualifier: Diagnosis of  By:  Erling Cruz  MD, MELISSA    . History of blood transfusion   . Hypertension   . OSTEOARTHRITIS 03/05/2007   Qualifier: Diagnosis of  By: Erling Cruz  MD, MELISSA    . PONV (postoperative nausea and vomiting)   . Prostate cancer ([place])   . SBO (small bowel obstruction) ([place]) 02/2017  . Seasonal allergies   . VENOUS INSUFFICIENCY, CHRONIC 02/26/2007   Qualifier: Diagnosis of  By: Erling Cruz  MD, MELISSA      Patient Active Problem List   Diagnosis Date Noted  . Acute GI bleeding 08/19/2018  . Acute respiratory failure with hypoxemia ([place])   . Possible OSA; on empiric CPAP 08/03/2018  . Fungus present in urine 08/03/2018  . Abdominal aortic atherosclerosis ([place]) 07/29/2018  . Pyuria 07/29/2018  . Sacral decubitus ulcer, stage III ([place]) 07/29/2018  . Somnolence 07/29/2018  . Acute respiratory acidosis 07/29/2018  . Poor personal hygiene 07/29/2018  . Hemoglobin decreased 07/29/2018  . Sepsis ([place]) 07/28/2018  . Pneumonia 06/04/2018  . Dehydration   . Possible HCAP (healthcare-associated pneumonia)   . Hypotension   . Grade II diastolic dysfunction   . Fluid overload 05/19/2018  . Elephantiasis nostra verrucosa 05/19/2018  . Pressure ulcer of upper thigh, right, stage III ([place]) 05/19/2018  . Candidal intertrigo 05/19/2018  . Chronic venous insufficiency, Bilateral Legs 05/19/2018  . Elevated PSA, greater than or equal to 20 ng/ml 05/19/2018  . History of prostate surgery, Per  Patient report, [facility] 05/19/2018  . Infestation by bed bug 05/19/2018  . Lives alone with limited help 05/19/2018  . Morbid obesity ([place]) 05/19/2018  . Dyspnea and respiratory abnormality 05/19/2018  . Systolic ejection murmur with radiation into Cartoids bilaterally 05/19/2018  . Paget's bone disease 05/19/2018  . Pulmonary hypertension ([place]) 05/19/2018  . Hypoxia   . AKI (acute kidney injury) ([place])   . Impaired mobility 09/09/2014  . Ulcer of left lower leg ([place]) 10/05/2012  . Palliative care encounter  10/05/2012  . Anemia of chronic disease 05/21/2012  . Chronic leg pain 11/17/2011  . HLD (hyperlipidemia) 12/01/2008  . ADENOCARCINOMA, PROSTATE 05/24/2008  . Morbid obesity with body mass index of 50.0-59.9 in adult ([place]) 03/05/2007  . Essential hypertension 03/05/2007    Past Surgical History:  Procedure Laterality Date  . COLONOSCOPY    . COLONOSCOPY    . HIP ARTHROPLASTY     bil  . I&D EXTREMITY  08/14/2012   Procedure: IRRIGATION AND DEBRIDEMENT EXTREMITY;  Surgeon: Newt Minion, MD;  Location: [facility];  Service: Orthopedics;  Laterality: Right;  Irrigation and Debridement Right tibia, Placement antibiotic beads  . I&D EXTREMITY Left 03/18/2014   Procedure: IRRIGATION AND DEBRIDEMENT EXTREMITY;  Surgeon: Newt Minion, MD;  Location: [facility];  Service: Orthopedics;  Laterality: Left;  Debridement Left Calf Ulcer, Apply Theraskin and Wound VAC  . IRRIGATION AND DEBRIDEMENT ABSCESS Left 03/18/2014   DR DUDA  . LEG SURGERY     ROD for fracture  . LEG WOUND REPAIR / CLOSURE     Beads .  Skin wound  . SKIN GRAFT     right leg- from donor skin  . SKIN GRAFT  1976   R wrist   skin graft from left thigh  . UPPER GASTROINTESTINAL ENDOSCOPY    . WISDOM TOOTH EXTRACTION    . WRIST FUSION     right wrist        Home Medications    Prior to Admission medications   Medication Sig Start Date End Date Taking? Authorizing Provider  aspirin 81 MG chewable tablet Chew 81 mg by mouth every other day.    Yes [provider]  collagenase (SANTYL) ointment Apply 1 application topically daily.   Yes [provider]  ferrous sulfate 325 (65 FE) MG tablet Take 1 tablet (325 mg total) by mouth 2 (two) times daily with a meal. 08/07/18  Yes Anderson, Chelsey L, DO  FLOVENT HFA 220 MCG/ACT inhaler INHALE 1 PUFF INTO THE LUNGS TWICE DAILY Patient taking differently: Inhale 1 puff into the lungs 2 (two) times daily.  10/02/17  Yes Mikell, Jeani Sow, MD  fluticasone (FLONASE) 50  MCG/ACT nasal spray Place 2 sprays into both nostrils daily.   Yes [provider]  Infant Care Products (DERMACLOUD) CREA Apply 1 application topically 3 (three) times daily.   Yes [provider]  latanoprost (XALATAN) 0.005 % ophthalmic solution INSTILL 1 DROP IN BOTH EYES EVERY EVENING FOR GLAUCOMA Patient taking differently: Place 1 drop into both eyes at bedtime.  07/08/18  Yes Nebo Bing, DO  losartan (COZAAR) 25 MG tablet Take 1 tablet (25 mg total) by mouth daily. 08/08/18  Yes Anderson, Chelsey L, DO  metoprolol tartrate (LOPRESSOR) 25 MG tablet Take 0.5 tablets (12.5 mg total) by mouth 2 (two) times daily. 08/07/18  Yes Anderson, Chelsey L, DO  nystatin (MYCOSTATIN/NYSTOP) powder Apply topically 2 (two) times daily. 07/13/18  Yes [place] Bing, DO  pravastatin (PRAVACHOL) 40 MG tablet TAKE 1 TABLET BY MOUTH DAILY 12/26/17  Yes Mikell, Jeani Sow, MD  Probiotic Product (PROBIOTIC PO) Take 2 capsules by mouth daily.   Yes [provider]  tamsulosin (FLOMAX) 0.4 MG CAPS capsule TAKE 2 CAPSULES BY MOUTH EVERY MORNING Patient taking differently: Take 0.8 mg by mouth daily.  07/08/18  Yes Eastman Bing, DO  torsemide (DEMADEX) 20 MG tablet Take 2 tablets (40 mg total) by mouth daily. 08/08/18  Yes Anderson, Chelsey L, DO  traMADol (ULTRAM) 50 MG tablet Take 50 mg by mouth every 6 (six) hours as needed for moderate pain or severe pain.   Yes [provider]  urea 10 % lotion Apply topically 2 (two) times daily. 08/07/18  Yes Anderson, Chelsey L, DO  Vancomycin HCl (FIRVANQ) 50 MG/ML SOLR Take 2.5 mLs by mouth 4 (four) times daily.   Yes [provider]  Hydrocortisone (GERHARDT'S BUTT CREAM) CREA Apply 1 application topically 3 (three) times daily. Patient not taking: Reported on 08/19/2018 08/07/18   Doristine Mango L, DO  mometasone (NASONEX) 50 MCG/ACT nasal spray INSTILL 2 SPRAYS INTO THE NOSE DAILY Patient not taking: Reported on  08/19/2018 07/08/18   [place] Bing, DO  PROAIR HFA 108 (90 Base) MCG/ACT inhaler INHALE 2 PUFFS BY MOUTH EVERY 4 HOURS AS NEEDED FOR WHEEZING OR SHORTNESS OF BREATH Patient taking differently: Inhale 2 puffs into the lungs every 4 (four) hours as needed for wheezing or shortness of breath.  04/18/18   Tonette Bihari, MD    Family History Family History  Problem Relation Age of Onset  . Hypertension Mother     Social History Social History   Tobacco Use  . Smoking status: Never Smoker  . Smokeless tobacco: Never Used  Substance Use Topics  . Alcohol use: No  . Drug use: No     Allergies   Ace inhibitors; Angiotensin receptor blockers; and Metformin   Review of Systems Review of Systems  Constitutional: Negative for chills and fever.  HENT: Negative for ear pain, nosebleeds and sore throat.   Eyes: Negative for pain and visual disturbance.  Respiratory: Negative for cough and shortness of breath.   Cardiovascular: Negative for chest pain and palpitations.  Gastrointestinal: Positive for blood in stool and hematochezia. Negative for abdominal pain, hematemesis and vomiting.  Genitourinary: Negative for dysuria and hematuria.  Musculoskeletal: Negative for arthralgias and back pain.  Skin: Negative for color change and rash.  Neurological: Positive for dizziness and light-headedness. Negative for seizures, loss of consciousness and syncope.  All other systems reviewed and are negative.    Physical Exam Updated Vital Signs  ED Triage Vitals  Enc Vitals Group     BP 08/19/18 1443 (!) 74/46     Pulse --      Resp 08/19/18 1443 18     Temp --      Temp src --      SpO2 08/19/18 1442 100 %     Weight --      Height --      Head Circumference --      Peak Flow --      Pain Score 08/19/18 1445 0     Pain Loc --      Pain Edu? --      Excl. in [place]? --     Physical Exam  Constitutional: He is oriented to person, place, and time. He appears well-developed and  well-nourished. He appears distressed.  HENT:  Head: Normocephalic and atraumatic.  Mouth/Throat: No oropharyngeal exudate.  Eyes: Pupils are equal, round, and reactive to light. Conjunctivae and EOM are normal.  Neck: Normal range of motion. Neck supple.  Cardiovascular: Normal rate, regular rhythm, normal heart sounds and intact distal pulses.  No murmur heard. Pulmonary/Chest: Effort normal and breath sounds normal. No respiratory distress. He has no wheezes.  Abdominal: Soft. Bowel sounds are normal. He exhibits no distension. There is no tenderness.  Genitourinary: Rectal exam shows guaiac positive stool (melena on exam).  Musculoskeletal: Normal range of motion. He exhibits no edema.  Neurological: He is alert and oriented to person, place, and time.  Skin: Skin is warm and dry.  Psychiatric: He has a normal mood and affect.  Nursing note and vitals reviewed.    ED Treatments / Results  Labs (all labs ordered are listed, but only abnormal results are displayed) Labs Reviewed  C DIFFICILE QUICK SCREEN W PCR REFLEX - Abnormal; Notable for the following components:      Result Value   C Diff antigen POSITIVE (*)    All other components within normal limits  COMPREHENSIVE METABOLIC PANEL - Abnormal; Notable for the following components:   Chloride 96 (*)    CO2 34 (*)    Glucose, Bld 205 (*)    BUN 109 (*)    Creatinine, Ser 2.15 (*)    Calcium 8.1 (*)    Total Protein 6.4 (*)    Albumin 2.1 (*)    AST 9 (*)    GFR calc non Af Amer 28 (*)    GFR calc Af Amer 32 (*)    All other components within normal limits  CBC WITH DIFFERENTIAL/PLATELET - Abnormal; Notable for the following components:   WBC 12.6 (*)    RBC 2.54 (*)    Hemoglobin 6.7 (*)    HCT 21.3 (*)    RDW 17.1 (*)    Neutro Abs 10.3 (*)    All other components within normal limits  CBG MONITORING, ED - Abnormal; Notable for the following components:   Glucose-Capillary 148 (*)    All other components  within normal limits  POC OCCULT BLOOD, ED - Abnormal; Notable for the following components:   Fecal Occult Bld POSITIVE (*)    All other components within normal limits  I-STAT CHEM 8, ED - Abnormal; Notable for the following components:   Chloride 91 (*)    BUN 111 (*)    Creatinine, Ser 2.10 (*)    Glucose, Bld 202 (*)    Calcium, Ion 1.08 (*)    TCO2 35 (*)    Hemoglobin 8.5 (*)    HCT 25.0 (*)    All other components within normal limits  URINE CULTURE  CULTURE, BLOOD (ROUTINE X 2)  CULTURE, BLOOD (ROUTINE X 2)  CLOSTRIDIUM DIFFICILE BY PCR, REFLEXED  LIPASE, BLOOD  PROTIME-INR  URINALYSIS, ROUTINE W REFLEX MICROSCOPIC  CBC  BASIC METABOLIC PANEL  I-STAT CG4 LACTIC ACID, ED  TYPE AND SCREEN  PREPARE RBC (CROSSMATCH)  ABO/RH    EKG None  Radiology Dg Chest Portable 1 View  Result Date: 08/19/2018 CLINICAL DATA:  Hypotension.  Rectal bleeding. EXAM: PORTABLE CHEST 1 VIEW COMPARISON:  08/03/2018 and 05/19/2018 FINDINGS: Heart size and pulmonary vascularity are normal. The lungs are clear. Slight chronic elevation of the left hemidiaphragm with slight chronic atelectasis at the left lung base. No acute bone abnormality. Severe left glenohumeral joint arthritis. Findings consistent with chronic complete  rotator cuff tears bilaterally. IMPRESSION: No acute abnormalities. Chronic elevation of the left hemidiaphragm with slight chronic atelectasis at the left lung base. Electronically Signed   By: Lorriane Shire M.D.   On: 08/19/2018 16:10    Procedures .Critical Care Performed by: Lennice Sites, DO Authorized by: Lennice Sites, DO   Critical care provider statement:    Critical care time (minutes):  65   Critical care was necessary to treat or prevent imminent or life-threatening deterioration of the following conditions:  Circulatory failure   Critical care was time spent personally by me on the following activities:  Blood draw for specimens, development of treatment  plan with patient or surrogate, discussions with consultants, discussions with primary provider, evaluation of patient's response to treatment, examination of patient, ordering and performing treatments and interventions, ordering and review of laboratory studies, ordering and review of radiographic studies, pulse oximetry, re-evaluation of patient's condition and review of old charts   I assumed direction of critical care for this patient from another provider in my specialty: no     (including critical care time)  Angiocath insertion Performed by: Lennice Sites  Consent: Verbal consent obtained. Risks and benefits: risks, benefits and alternatives were discussed Time out: Immediately prior to procedure a "time out" was called to verify the correct patient, procedure, equipment, support staff and site/side marked as required.  Preparation: Patient was prepped and draped in the usual sterile fashion.  Vein Location: LUE x 2 IVs  Ultrasound Guided  Gauge: 18  Normal blood return and flush without difficulty Patient tolerance: Patient tolerated the procedure well with no immediate complications.   Medications Ordered in ED Medications  0.9 %  sodium chloride infusion (has no administration in time range)  0.9 %  sodium chloride infusion (has no administration in time range)  vancomycin (VANCOCIN) 50 mg/mL oral solution 125 mg (has no administration in time range)  pantoprazole (PROTONIX) injection 40 mg (has no administration in time range)  0.9 %  sodium chloride infusion (Manually program via Guardrails IV Fluids) ( Intravenous Stopped 08/19/18 1608)  pantoprazole (PROTONIX) injection 40 mg (40 mg Intravenous Given 08/19/18 1541)  0.9 %  sodium chloride infusion (10 mL/hr Intravenous New Bag/Given 08/19/18 1610)  lactated ringers bolus 1,000 mL (0 mLs Intravenous Stopped 08/19/18 1710)     Initial Impression / Assessment and Plan / ED Course  I have reviewed the triage vital signs  and the nursing notes.  Pertinent labs & imaging results that were available during my care of the patient were reviewed by me and considered in my medical decision making (see chart for details).     DEHAVEN SINE is a 77 year old male with history of chronic osteomyelitis, bedbound, diabetes, history of colon polyp who presents to the ED with hypotension and melena.  Patient with hypotension and elevated heart rate upon arrival.  Patient with gross melena on exam.  Patient found to be hypotensive and with dark stools at nursing care facility today.  Patient has some shortness of breath but denies any chest pain.  Patient denies any abdominal pain.  Patient appears chronically ill.  Exam is overall unremarkable except for melena.  There is a concern for possible C. difficile infection.  Patient given hypotension and melena given emergency release blood.  Initial hemoglobin was 6.7.  IV fluids were started and patient given IV Protonix.   Patient had EKG that showed sinus rhythm.  No acute ST changes.  Patient with improvement of blood pressure  following IV fluids and 1 unit of packed red blood cells.  The ICU was consulted and will admit the patient for care.  Dr. Collene Mares with gastroenterology was consulted and will likely perform endoscopy tomorrow morning as long as patient remains hemodynamically stable.  Patient started on oral vancomycin for possible C. difficile.  Patient had 2 large-bore IVs placed by myself under ultrasound guidance.  Patient also with elevated creatinine from baseline.  Otherwise no significant electrolyte abnormality.  INR within normal limits.  Patient is not on any blood thinners.  Chest x-ray showed no signs of pneumonia, pneumothorax, pleural effusion.  Patient admitted to the ICU for GI bleed and hemodynamically stable at time of transfer to the ICU team.  Final Clinical Impressions(s) / ED Diagnoses   Final diagnoses:  Gastrointestinal hemorrhage, unspecified  gastrointestinal hemorrhage type  Hemorrhagic shock ([place])  AKI (acute kidney injury) [facility])    ED Discharge Orders    None       Lennice Sites, DO 08/19/18 1844

## 2018-08-19 NOTE — ED Notes (Signed)
ED Provider at bedside attempting IV access 

## 2018-08-19 NOTE — ED Notes (Signed)
Pt has multiple skins issues on sacrum and skins tears; pt came with two heel protectant's on from facility; Pt has multiple abrasions and wounds on BLE

## 2018-08-19 NOTE — ED Triage Notes (Signed)
Pt arrived via [facility] from [facility]. They stated pt had rectal bleeding and was hypotensive 10A systolic Rehab put in SQ butterfly needle got about 500 of NS fluid. Pt complains of no pain or dizziness Pt has cdiff

## 2018-08-19 NOTE — H&P (Signed)
PULMONARY / CRITICAL CARE MEDICINE   Name: [name] MRN: [id number] DOB: [birth year]    ADMISSION DATE:  08/19/2018 CONSULTATION DATE: 08/19/2018  REFERRING MD: Dr. Ronnald Nian  CHIEF COMPLAINT: GI bleed, melanotic stools  HISTORY OF PRESENT ILLNESS:   Patient was brought into the hospital secondary to noting black tarry stools, EMS was activated Has no diarrhea, no hemorrhoids Currently being treated for C. difficile colitis Noted to be hypotensive No aggravating or relieving factors Denies any abdominal pain No history of liver disease Not taking any blood thinners  He was recently hospitalized for sepsis Treated with course of antibiotics Did have a cardiac arrest on 07/31/2018-lasted 2 minutes He was ventilated at the time  PAST MEDICAL HISTORY :  He  has a past medical history of Chronic osteomyelitis of lower leg ([place]) (11/20/2010), COLON POLYP (5/99/3570), Complication of anesthesia, Diabetes mellitus, Diabetic peripheral neuropathy associated with type 2 diabetes mellitus ([place]) (02/07/2014), GERD (gastroesophageal reflux disease), Grade II diastolic dysfunction, Heart murmur, HIATAL HERNIA WITH REFLUX (09/29/2006), HIP REPLACEMENT, BILATERAL, HX OF (05/12/2007), History of blood transfusion, Hypertension, OSTEOARTHRITIS (03/05/2007), PONV (postoperative nausea and vomiting), Prostate cancer ([place]), SBO (small bowel obstruction) ([place]) (02/2017), Seasonal allergies, and VENOUS INSUFFICIENCY, CHRONIC (02/26/2007).  PAST SURGICAL HISTORY: He  has a past surgical history that includes Hip Arthroplasty; Leg wound repair / closure; Skin graft; Skin graft (1976); Wrist fusion; Colonoscopy; Upper gastrointestinal endoscopy; Leg Surgery; Wisdom tooth extraction; I&D extremity (08/14/2012); Colonoscopy; Irrigation and debridement abscess (Left, 03/18/2014); and I&D extremity (Left, 03/18/2014).  Allergies  Allergen Reactions  . Ace Inhibitors Cough    With Lisinopril.  . Angiotensin Receptor  Blockers Other (See Comments)    Caused excessive weight gain  . Metformin Diarrhea    No current facility-administered medications on file prior to encounter.    Current Outpatient Medications on File Prior to Encounter  Medication Sig  . aspirin 81 MG chewable tablet Chew 81 mg by mouth every other day.   . collagenase (SANTYL) ointment Apply 1 application topically daily.  . ferrous sulfate 325 (65 FE) MG tablet Take 1 tablet (325 mg total) by mouth 2 (two) times daily with a meal.  . FLOVENT HFA 220 MCG/ACT inhaler INHALE 1 PUFF INTO THE LUNGS TWICE DAILY (Patient taking differently: Inhale 1 puff into the lungs 2 (two) times daily. )  . fluticasone (FLONASE) 50 MCG/ACT nasal spray Place 2 sprays into both nostrils daily.  . Infant Care Products (DERMACLOUD) CREA Apply 1 application topically 3 (three) times daily.  Marland Kitchen latanoprost (XALATAN) 0.005 % ophthalmic solution INSTILL 1 DROP IN BOTH EYES EVERY EVENING FOR GLAUCOMA (Patient taking differently: Place 1 drop into both eyes at bedtime. )  . losartan (COZAAR) 25 MG tablet Take 1 tablet (25 mg total) by mouth daily.  . metoprolol tartrate (LOPRESSOR) 25 MG tablet Take 0.5 tablets (12.5 mg total) by mouth 2 (two) times daily.  Marland Kitchen nystatin (MYCOSTATIN/NYSTOP) powder Apply topically 2 (two) times daily.  . pravastatin (PRAVACHOL) 40 MG tablet TAKE 1 TABLET BY MOUTH DAILY  . Probiotic Product (PROBIOTIC PO) Take 2 capsules by mouth daily.  . tamsulosin (FLOMAX) 0.4 MG CAPS capsule TAKE 2 CAPSULES BY MOUTH EVERY MORNING (Patient taking differently: Take 0.8 mg by mouth daily. )  . torsemide (DEMADEX) 20 MG tablet Take 2 tablets (40 mg total) by mouth daily.  . traMADol (ULTRAM) 50 MG tablet Take 50 mg by mouth every 6 (six) hours as needed for moderate pain or severe pain.  Marland Kitchen  urea 10 % lotion Apply topically 2 (two) times daily.  . Vancomycin HCl (FIRVANQ) 50 MG/ML SOLR Take 2.5 mLs by mouth 4 (four) times daily.  . Hydrocortisone  (GERHARDT'S BUTT CREAM) CREA Apply 1 application topically 3 (three) times daily. (Patient not taking: Reported on 08/19/2018)  . mometasone (NASONEX) 50 MCG/ACT nasal spray INSTILL 2 SPRAYS INTO THE NOSE DAILY (Patient not taking: Reported on 08/19/2018)  . PROAIR HFA 108 (90 Base) MCG/ACT inhaler INHALE 2 PUFFS BY MOUTH EVERY 4 HOURS AS NEEDED FOR WHEEZING OR SHORTNESS OF BREATH (Patient taking differently: Inhale 2 puffs into the lungs every 4 (four) hours as needed for wheezing or shortness of breath. )    FAMILY HISTORY:  His family history includes Hypertension in his mother.  SOCIAL HISTORY: He  reports that he has never smoked. He has never used smokeless tobacco. He reports that he does not drink alcohol or use drugs.  REVIEW OF SYSTEMS:   Review of Systems  Gastrointestinal: Positive for blood in stool and melena.  Neurological: Positive for dizziness.  All other systems reviewed and are negative.    SUBJECTIVE:  States he lacks energy Denies any pain or discomfort Feels slightly better compared to when he initially came into the ED   VITAL SIGNS: BP (!) 88/50   Pulse 83   Temp (!) 96.8 F (36 C) (Axillary)   Resp 18   SpO2 100%   INTAKE / OUTPUT: No intake/output data recorded.  PHYSICAL EXAMINATION: General: Elderly gentleman, does not appear to be in distress  neuro: Alert and oriented x3, moving extremities HEENT: Moist oral mucosa Cardiovascular: S1-S2 appreciated, no murmur Lungs: Decreased air movement at the bases bilaterally Abdomen: Bowel sounds appreciated Skin: Skin is warm and dry Skin changes of chronic venous stasis  Stool was guaiac positive  LABS:  BMET Recent Labs  Lab 08/19/18 1451 08/19/18 1527  NA 143 141  K 3.9 3.7  CL 96* 91*  CO2 34*  --   BUN 109* 111*  CREATININE 2.15* 2.10*  GLUCOSE 205* 202*    Electrolytes Recent Labs  Lab 08/19/18 1451  CALCIUM 8.1*    CBC Recent Labs  Lab 08/19/18 1517 08/19/18 1527   WBC 12.6*  --   HGB 6.7* 8.5*  HCT 21.3* 25.0*  PLT 311  --     Coag's Recent Labs  Lab 08/19/18 1517  INR 1.18    Sepsis Markers Recent Labs  Lab 08/19/18 1527  LATICACIDVEN 1.20    ABG No results for input(s): PHART, PCO2ART, PO2ART in the last 168 hours.  Liver Enzymes Recent Labs  Lab 08/19/18 1451  AST 9*  ALT 8  ALKPHOS 65  BILITOT 0.3  ALBUMIN 2.1*    Cardiac Enzymes No results for input(s): TROPONINI, PROBNP in the last 168 hours.  Glucose Recent Labs  Lab 08/19/18 1448  GLUCAP 148*    Imaging Dg Chest Portable 1 View  Result Date: 08/19/2018 CLINICAL DATA:  Hypotension.  Rectal bleeding. EXAM: PORTABLE CHEST 1 VIEW COMPARISON:  08/03/2018 and 05/19/2018 FINDINGS: Heart size and pulmonary vascularity are normal. The lungs are clear. Slight chronic elevation of the left hemidiaphragm with slight chronic atelectasis at the left lung base. No acute bone abnormality. Severe left glenohumeral joint arthritis. Findings consistent with chronic complete rotator cuff tears bilaterally. IMPRESSION: No acute abnormalities. Chronic elevation of the left hemidiaphragm with slight chronic atelectasis at the left lung base. Electronically Signed   By: Lorriane Shire M.D.  On: 08/19/2018 16:10     STUDIES:   Chest x-ray reviewed by myself showing clear lungs, left hemidiaphragm elevation  CULTURES: Blood cultures drawn-results pending  SIGNIFICANT EVENTS: He has received 1 packed red cells, receiving second unit at present  LINES/TUBES: 2 18-gauge left upper extremity IV lines  DISCUSSION: Elderly gentleman being treated for C. difficile colitis Noted to have melanotic stools Brought into the hospital for further evaluation He was hypotensive, low hematocrit  He was recently hospitalized treated with courses of antibiotics  Was treated for sepsis secondary to UTI/pneumonia/soft tissue infection discharged to [facility] skilled nursing  facility on 08/07/2018  ASSESSMENT / PLAN:  PULMONARY A: Stable respiratory status Hypoxia  P:   Continue oxygen supplementation  CARDIOVASCULAR A:  History of systolic heart failure Hypotension History of diastolic dysfunction P:  Cautious fluid resuscitation Receiving second unit of blood transfusion at present  RENAL A:   Acute kidney injury P:   Cautious fluid resuscitation Continue to monitor electrolytes  GASTROINTESTINAL A:   GI bleed C. difficile infection  P:   Transfusion GI service consulted  HEMATOLOGIC A:   Anemia of blood loss P:  Cautious transfusion  INFECTIOUS A:   Mild leukocytosis P:   Follow cultures  NEUROLOGIC A:   Mentating appropriately P:   RASS goal: 0-1    FAMILY  - Updates: Discussed with daughter at bedside  Anemia due to blood loss Severe C. difficile colitis  Isolation for C. difficile colitis Start on vancomycin 125 every 6  C. difficile was negative on 08/03/2018 Now antigen positive  ccm time spent evaluating and treating patient 35 min  Pulmonary and [place] PNTI:[id number]  08/19/2018, 5:33 PM

## 2018-08-19 NOTE — ED Notes (Signed)
ED TO INPATIENT HANDOFF REPORT  Name/Age/Gender [name] 77 y.o. male  Code Status    Code Status Orders  (From admission, onward)         Start     Ordered   08/19/18 1808  Full code  Continuous     08/19/18 1810        Code Status History    Date Active Date Inactive Code Status Order ID Comments User Context   07/28/2018 2114 08/08/2018 0226 Full Code [id number]  Shirley, [place], DO Inpatient   06/04/2018 0917 06/08/2018 1914 Full Code [id number]  Tonette Bihari, MD ED   05/19/2018 [phone] 06/02/2018 2118 Full Code [id number]  Guadalupe Dawn, MD ED   03/15/2017 0147 03/19/2017 2344 Full Code [id number]  Mayo, Pete Pelt, MD Inpatient   03/18/2014 1556 03/22/2014 1824 Full Code [id number]  Newt Minion, MD Inpatient   08/14/2012 1504 08/17/2012 1929 Full Code [id number]  Clayburn Pert, RN Inpatient    Advance Directive Documentation     Most Recent Value  Type of Advance Directive  Healthcare Power of Attorney  Pre-existing out of facility DNR order (yellow form or pink MOST form)  -  "MOST" Form in Place?  -      Home/SNF/Other Nursing Home  Chief Complaint Bloody Stool  Level of Care/Admitting Diagnosis ED Disposition    ED Disposition Condition [place]: [facility] [100102]  Level of Care: ICU [6]  Diagnosis: Acute GI bleeding [182993]  Admitting Physician: Laurin Coder [[id number]]  Attending Physician: Laurin Coder [[id number]]  Estimated length of stay: past midnight tomorrow  Certification:: I certify this patient will need inpatient services for at least 2 midnights  PT Class (Do Not Modify): Inpatient [101]  PT Acc Code (Do Not Modify): Private [1]       Medical History Past Medical History:  Diagnosis Date  . Chronic osteomyelitis of lower leg ([facility]) 11/20/2010   Chronic osteoarthritis of right lower leg-10/03/10 note by Dr. Harvie Heck scanned of this visit under media tab. Dr. Sharol Given states that this is  chronic osteomyelitis, only cure would be amputation. Patient was not agreeable to this at his visit with Dr. Sharol Given. Patient elected to apply Bactroban cream, Dr. Sharol Given and time of that visit also prescribe doxycycline. Dr. Sharol Given stay the patient she come to the [facility] 02/26/2007   Qualifier: Diagnosis of  By: Erling Cruz  MD, [place]    . Complication of anesthesia   . Diabetes mellitus   . Diabetic peripheral neuropathy associated with type 2 diabetes mellitus ([place]) 02/07/2014  . GERD (gastroesophageal reflux disease)   . Grade II diastolic dysfunction   . Heart murmur    last 2D Echo -03/31/08  . HIATAL HERNIA WITH REFLUX 09/29/2006   Qualifier: Diagnosis of  By: Erling Cruz  MD, MELISSA    . HIP REPLACEMENT, BILATERAL, HX OF 05/12/2007   Qualifier: Diagnosis of  By: Erling Cruz  MD, MELISSA    . History of blood transfusion   . Hypertension   . OSTEOARTHRITIS 03/05/2007   Qualifier: Diagnosis of  By: Erling Cruz  MD, MELISSA    . PONV (postoperative nausea and vomiting)   . Prostate cancer ([place])   . SBO (small bowel obstruction) ([place]) 02/2017  . Seasonal allergies   . VENOUS INSUFFICIENCY, CHRONIC 02/26/2007   Qualifier: Diagnosis of  By: Erling Cruz  MD, MELISSA      Allergies Allergies  Allergen Reactions  .  Ace Inhibitors Cough    With Lisinopril.  . Angiotensin Receptor Blockers Other (See Comments)    Caused excessive weight gain  . Metformin Diarrhea    IV Location/Drains/Wounds Patient Lines/Drains/Airways Status   Active Line/Drains/Airways    Name:   Placement date:   Placement time:   Site:   Days:   Peripheral IV 08/19/18 Left;Upper Arm   08/19/18    1520    Arm   less than 1   External Urinary Catheter   08/19/18    1536    -   less than 1   Incision 08/14/12 Leg Right   08/14/12    1149     2196   Incision (Closed) 03/18/14 Leg Left   03/18/14    1410     1615   Pressure Injury 05/19/18 Stage II -  Partial thickness loss of dermis presenting as a shallow open ulcer with a red, pink wound bed  without slough.   05/19/18    2134     92   Pressure Injury Stage II -  Partial thickness loss of dermis presenting as a shallow open ulcer with a red, pink wound bed without slough.   -    -               Labs/Imaging Results for orders placed or performed during the hospital encounter of 08/19/18 (from the past 48 hour(s))  POC CBG, ED     Status: Abnormal   Collection Time: 08/19/18  2:48 PM  Result Value Ref Range   Glucose-Capillary 148 (H) 70 - 99 mg/dL  Comprehensive metabolic panel     Status: Abnormal   Collection Time: 08/19/18  2:51 PM  Result Value Ref Range   Sodium 143 135 - 145 mmol/L   Potassium 3.9 3.5 - 5.1 mmol/L   Chloride 96 (L) 98 - 111 mmol/L   CO2 34 (H) 22 - 32 mmol/L   Glucose, Bld 205 (H) 70 - 99 mg/dL   BUN 109 (H) 8 - 23 mg/dL    Comment: RESULTS CONFIRMED BY MANUAL DILUTION   Creatinine, Ser 2.15 (H) 0.61 - 1.24 mg/dL   Calcium 8.1 (L) 8.9 - 10.3 mg/dL   Total Protein 6.4 (L) 6.5 - 8.1 g/dL   Albumin 2.1 (L) 3.5 - 5.0 g/dL   AST 9 (L) 15 - 41 U/L   ALT 8 0 - 44 U/L   Alkaline Phosphatase 65 38 - 126 U/L   Total Bilirubin 0.3 0.3 - 1.2 mg/dL   GFR calc non Af Amer 28 (L) >60 mL/min   GFR calc Af Amer 32 (L) >60 mL/min    Comment: (NOTE) The eGFR has been calculated using the CKD EPI equation. This calculation has not been validated in all clinical situations. eGFR's persistently <60 mL/min signify possible Chronic Kidney Disease.    Anion gap 13 5 - 15    Comment: Performed at [facility], [place] [address] [place], [place] [address]  CBC with Differential     Status: Abnormal   Collection Time: 08/19/18  3:17 PM  Result Value Ref Range   WBC 12.6 (H) 4.0 - 10.5 K/uL   RBC 2.54 (L) 4.22 - 5.81 MIL/uL   Hemoglobin 6.7 (LL) 13.0 - 17.0 g/dL    Comment: REPEATED TO VERIFY CRITICAL RESULT CALLED TO, READ BACK BY AND VERIFIED WITH: J.INMAN AT 1550 ON 08/19/18 BY N.THOMPSON    HCT 21.3 (L) 39.0 - 52.0 %  MCV 83.9 78.0 -  100.0 fL   MCH 26.4 26.0 - 34.0 pg   MCHC 31.5 30.0 - 36.0 g/dL   RDW 17.1 (H) 11.5 - 15.5 %   Platelets 311 150 - 400 K/uL   Neutrophils Relative % 81 %   Neutro Abs 10.3 (H) 1.7 - 7.7 K/uL   Lymphocytes Relative 12 %   Lymphs Abs 1.5 0.7 - 4.0 K/uL   Monocytes Relative 4 %   Monocytes Absolute 0.4 0.1 - 1.0 K/uL   Eosinophils Relative 3 %   Eosinophils Absolute 0.3 0.0 - 0.7 K/uL   Basophils Relative 0 %   Basophils Absolute 0.0 0.0 - 0.1 K/uL    Comment: Performed at [facility], [place] [address] [place], [place] [address]  Lipase, blood     Status: None   Collection Time: 08/19/18  3:17 PM  Result Value Ref Range   Lipase 21 11 - 51 U/L    Comment: Performed at [facility], [place] [address], [place], [place] [address]  Protime-INR     Status: None   Collection Time: 08/19/18  3:17 PM  Result Value Ref Range   Prothrombin Time 14.9 11.4 - 15.2 seconds   INR 1.18     Comment: Performed at [facility], [place] [address] [place], [place] [address]  Type and screen [place]     Status: None (Preliminary result)   Collection Time: 08/19/18  3:21 PM  Result Value Ref Range   ABO/RH(D) O POS    Antibody Screen NEG    Sample Expiration 08/22/2018    Unit Number [id number]    Blood Component Type RBC LR PHER2    Unit division 00    Status of Unit ISSUED    Unit tag comment VERBAL ORDERS PER DR CURATOLO    Transfusion Status OK TO TRANSFUSE    Crossmatch Result COMPATIBLE    Unit Number [id number]    Blood Component Type RED CELLS,LR    Unit division 00    Status of Unit ISSUED    Transfusion Status OK TO TRANSFUSE    Crossmatch Result      Compatible Performed at [facility], [place] [address] [place], [place] [address]   Prepare RBC     Status: None   Collection Time: 08/19/18  3:21 PM  Result Value Ref Range   Order Confirmation      ORDER PROCESSED BY BLOOD  BANK Performed at [place] [address], [place], [place] [address]   ABO/Rh     Status: None (Preliminary result)   Collection Time: 08/19/18  3:21 PM  Result Value Ref Range   ABO/RH(D)      O POS Performed at [facility], [place] [address], [place],  [address]   I-Stat Chem 8, ED     Status: Abnormal   Collection Time: 08/19/18  3:27 PM  Result Value Ref Range   Sodium 141 135 - 145 mmol/L   Potassium 3.7 3.5 - 5.1 mmol/L   Chloride 91 (L) 98 - 111 mmol/L   BUN 111 (H) 8 - 23 mg/dL   Creatinine, Ser 2.10 (H) 0.61 - 1.24 mg/dL   Glucose, Bld 202 (H) 70 - 99 mg/dL   Calcium, Ion 1.08 (L) 1.15 - 1.40 mmol/L   TCO2 35 (H) 22 - 32 mmol/L   Hemoglobin 8.5 (L) 13.0 - 17.0 g/dL   HCT 25.0 (  L) 39.0 - 52.0 %  I-Stat CG4 Lactic Acid, ED     Status: None   Collection Time: 08/19/18  3:27 PM  Result Value Ref Range   Lactic Acid, Venous 1.20 0.5 - 1.9 mmol/L  POC occult blood, ED     Status: Abnormal   Collection Time: 08/19/18  3:43 PM  Result Value Ref Range   Fecal Occult Bld POSITIVE (A) NEGATIVE  C difficile quick scan w PCR reflex     Status: Abnormal   Collection Time: 08/19/18  3:55 PM  Result Value Ref Range   C Diff antigen POSITIVE (A) NEGATIVE   C Diff toxin NEGATIVE NEGATIVE   C Diff interpretation Results are indeterminate. See PCR results.     Comment: Performed at [facility], [place] [address] [place], [place] [address]   Dg Chest Portable 1 View  Result Date: 08/19/2018 CLINICAL DATA:  Hypotension.  Rectal bleeding. EXAM: PORTABLE CHEST 1 VIEW COMPARISON:  08/03/2018 and 05/19/2018 FINDINGS: Heart size and pulmonary vascularity are normal. The lungs are clear. Slight chronic elevation of the left hemidiaphragm with slight chronic atelectasis at the left lung base. No acute bone abnormality. Severe left glenohumeral joint arthritis. Findings consistent with chronic complete rotator cuff tears  bilaterally. IMPRESSION: No acute abnormalities. Chronic elevation of the left hemidiaphragm with slight chronic atelectasis at the left lung base. Electronically Signed   By: Lorriane Shire M.D.   On: 08/19/2018 16:10    Pending Labs Unresulted Labs (From admission, onward)    Start     Ordered   08/20/18 0500  CBC  Tomorrow morning,   R     08/19/18 1810   08/20/18 1017  Basic metabolic panel  Tomorrow morning,   R     08/19/18 1810   08/19/18 1555  C. Diff by PCR, Reflexed  Once,   STAT     08/19/18 1555   08/19/18 1541  Blood culture (routine x 2)  BLOOD CULTURE X 2,   STAT     08/19/18 1540   08/19/18 1538  Urinalysis, Routine w reflex microscopic  Once,   R     08/19/18 1537   08/19/18 1538  Urine culture  STAT,   STAT     08/19/18 1537          Vitals/Pain Today's Vitals   08/19/18 1828 08/19/18 1829 08/19/18 1830 08/19/18 1831  BP:      Pulse:    90  Resp: 17 15 (!) 21 (!) 21  Temp:      TempSrc:      SpO2:    100%  PainSc:        Isolation Precautions Contact precautions  Medications Medications  0.9 %  sodium chloride infusion (has no administration in time range)  0.9 %  sodium chloride infusion (has no administration in time range)  vancomycin (VANCOCIN) 50 mg/mL oral solution 125 mg (has no administration in time range)  pantoprazole (PROTONIX) injection 40 mg (has no administration in time range)  0.9 %  sodium chloride infusion (Manually program via Guardrails IV Fluids) ( Intravenous Stopped 08/19/18 1608)  pantoprazole (PROTONIX) injection 40 mg (40 mg Intravenous Given 08/19/18 1541)  0.9 %  sodium chloride infusion (10 mL/hr Intravenous New Bag/Given 08/19/18 1610)  lactated ringers bolus 1,000 mL (0 mLs Intravenous Stopped 08/19/18 1710)    Mobility non-ambulatory

## 2018-08-19 NOTE — ED Notes (Signed)
Pt has condom cath on. Was not able to insert foley cath.

## 2018-08-19 NOTE — ED Notes (Signed)
Pt refused 2nd IV 

## 2018-08-19 NOTE — ED Notes (Signed)
Bed: ER15 Expected date:  Expected time:  Means of arrival:  Comments: EMS/bloody stool

## 2018-08-20 DIAGNOSIS — L8990 Pressure ulcer of unspecified site, unspecified stage: Secondary | ICD-10-CM | POA: Diagnosis present

## 2018-08-20 LAB — BASIC METABOLIC PANEL
ANION GAP: 9 (ref 5–15)
BUN: 100 mg/dL — ABNORMAL HIGH (ref 8–23)
CHLORIDE: 104 mmol/L (ref 98–111)
CO2: 33 mmol/L — ABNORMAL HIGH (ref 22–32)
Calcium: 8 mg/dL — ABNORMAL LOW (ref 8.9–10.3)
Creatinine, Ser: 1.83 mg/dL — ABNORMAL HIGH (ref 0.61–1.24)
GFR calc Af Amer: 39 mL/min — ABNORMAL LOW (ref >60)
GFR, EST NON AFRICAN AMERICAN: 34 mL/min — AB (ref >60)
GLUCOSE: 158 mg/dL — AB (ref 70–99)
POTASSIUM: 3.7 mmol/L (ref 3.5–5.1)
Sodium: 146 mmol/L — ABNORMAL HIGH (ref 135–145)

## 2018-08-20 LAB — URINALYSIS, ROUTINE W REFLEX MICROSCOPIC
Bilirubin Urine: NEGATIVE
Glucose, UA: NEGATIVE mg/dL
Ketones, ur: NEGATIVE mg/dL
Leukocytes, UA: NEGATIVE
Nitrite: NEGATIVE
PROTEIN: NEGATIVE mg/dL
Specific Gravity, Urine: 1.014 (ref 1.005–1.030)
pH: 5 (ref 5.0–8.0)

## 2018-08-20 LAB — CBC
HEMATOCRIT: 25.1 % — AB (ref 39.0–52.0)
HEMOGLOBIN: 8 g/dL — AB (ref 13.0–17.0)
MCH: 26.8 pg (ref 26.0–34.0)
MCHC: 31.9 g/dL (ref 30.0–36.0)
MCV: 84.2 fL (ref 78.0–100.0)
PLATELETS: 227 10*3/uL (ref 150–400)
RBC: 2.98 MIL/uL — AB (ref 4.22–5.81)
RDW: 16.6 % — ABNORMAL HIGH (ref 11.5–15.5)
WBC: 13.2 10*3/uL — AB (ref 4.0–10.5)

## 2018-08-20 LAB — ABO/RH: ABO/RH(D): O POS

## 2018-08-20 LAB — PREPARE RBC (CROSSMATCH)

## 2018-08-20 MED ORDER — SODIUM CHLORIDE 0.9 % IV SOLN
INTRAVENOUS | Status: AC
  Administered 2018-08-20: 13:00:00 via INTRAVENOUS

## 2018-08-20 MED ORDER — SODIUM CHLORIDE 0.9% IV SOLUTION
Freq: Once | INTRAVENOUS | Status: AC
  Administered 2018-08-20: 17:00:00 via INTRAVENOUS

## 2018-08-20 NOTE — Progress Notes (Signed)
PULMONARY / CRITICAL CARE MEDICINE   Name: [name] MRN: [id number] DOB: 1941/12/29    ADMISSION DATE:  08/19/2018 CONSULTATION DATE: 08/19/2018  REFERRING MD: Dr. Ronnald Nian CHIEF COMPLAINT: GI bleed, melanotic stools  HISTORY OF PRESENT ILLNESS:   Patient was brought into the hospital secondary to noting black tarry stools, EMS was activated Had no diarrhea, no hemorrhoids Noted to be hypotensive No aggravating or relieving factors Denies any abdominal pain No history of liver disease Not taking any blood thinners  He was recently hospitalized for sepsis Treated with course of antibiotics Did have a cardiac arrest on 07/31/2018-lasted 2 minutes He was ventilated at the time  PAST MEDICAL HISTORY :  He  has a past medical history of Chronic osteomyelitis of lower leg ([place]) (11/20/2010), COLON POLYP (2/84/1324), Complication of anesthesia, Diabetes mellitus, Diabetic peripheral neuropathy associated with type 2 diabetes mellitus ([place]) (02/07/2014), GERD (gastroesophageal reflux disease), Grade II diastolic dysfunction, Heart murmur, HIATAL HERNIA WITH REFLUX (09/29/2006), HIP REPLACEMENT, BILATERAL, HX OF (05/12/2007), History of blood transfusion, Hypertension, OSTEOARTHRITIS (03/05/2007), PONV (postoperative nausea and vomiting), Prostate cancer ([place]), SBO (small bowel obstruction) ([place]) (02/2017), Seasonal allergies, and VENOUS INSUFFICIENCY, CHRONIC (02/26/2007).  PAST SURGICAL HISTORY: He  has a past surgical history that includes Hip Arthroplasty; Leg wound repair / closure; Skin graft; Skin graft (1976); Wrist fusion; Colonoscopy; Upper gastrointestinal endoscopy; Leg Surgery; Wisdom tooth extraction; I&D extremity (08/14/2012); Colonoscopy; Irrigation and debridement abscess (Left, 03/18/2014); and I&D extremity (Left, 03/18/2014).  Allergies  Allergen Reactions  . Ace Inhibitors Cough    With Lisinopril.  . Angiotensin Receptor Blockers Other (See Comments)    Caused excessive  weight gain  . Metformin Diarrhea    No current facility-administered medications on file prior to encounter.    Current Outpatient Medications on File Prior to Encounter  Medication Sig  . aspirin 81 MG chewable tablet Chew 81 mg by mouth every other day.   . collagenase (SANTYL) ointment Apply 1 application topically daily.  . ferrous sulfate 325 (65 FE) MG tablet Take 1 tablet (325 mg total) by mouth 2 (two) times daily with a meal.  . FLOVENT HFA 220 MCG/ACT inhaler INHALE 1 PUFF INTO THE LUNGS TWICE DAILY (Patient taking differently: Inhale 1 puff into the lungs 2 (two) times daily. )  . fluticasone (FLONASE) 50 MCG/ACT nasal spray Place 2 sprays into both nostrils daily.  . Infant Care Products (DERMACLOUD) CREA Apply 1 application topically 3 (three) times daily.  Marland Kitchen latanoprost (XALATAN) 0.005 % ophthalmic solution INSTILL 1 DROP IN BOTH EYES EVERY EVENING FOR GLAUCOMA (Patient taking differently: Place 1 drop into both eyes at bedtime. )  . losartan (COZAAR) 25 MG tablet Take 1 tablet (25 mg total) by mouth daily.  . metoprolol tartrate (LOPRESSOR) 25 MG tablet Take 0.5 tablets (12.5 mg total) by mouth 2 (two) times daily.  Marland Kitchen nystatin (MYCOSTATIN/NYSTOP) powder Apply topically 2 (two) times daily.  . pravastatin (PRAVACHOL) 40 MG tablet TAKE 1 TABLET BY MOUTH DAILY  . Probiotic Product (PROBIOTIC PO) Take 2 capsules by mouth daily.  . tamsulosin (FLOMAX) 0.4 MG CAPS capsule TAKE 2 CAPSULES BY MOUTH EVERY MORNING (Patient taking differently: Take 0.8 mg by mouth daily. )  . torsemide (DEMADEX) 20 MG tablet Take 2 tablets (40 mg total) by mouth daily.  . traMADol (ULTRAM) 50 MG tablet Take 50 mg by mouth every 6 (six) hours as needed for moderate pain or severe pain.  . urea 10 % lotion Apply topically 2 (two)  times daily.  . Vancomycin HCl (FIRVANQ) 50 MG/ML SOLR Take 2.5 mLs by mouth 4 (four) times daily.  . Hydrocortisone (GERHARDT'S BUTT CREAM) CREA Apply 1 application topically 3  (three) times daily. (Patient not taking: Reported on 08/19/2018)  . mometasone (NASONEX) 50 MCG/ACT nasal spray INSTILL 2 SPRAYS INTO THE NOSE DAILY (Patient not taking: Reported on 08/19/2018)  . PROAIR HFA 108 (90 Base) MCG/ACT inhaler INHALE 2 PUFFS BY MOUTH EVERY 4 HOURS AS NEEDED FOR WHEEZING OR SHORTNESS OF BREATH (Patient taking differently: Inhale 2 puffs into the lungs every 4 (four) hours as needed for wheezing or shortness of breath. )    FAMILY HISTORY:  His family history includes Hypertension in his mother.  SOCIAL HISTORY: He  reports that he has never smoked. He has never used smokeless tobacco. He reports that he does not drink alcohol or use drugs.  REVIEW OF SYSTEMS:   Review of Systems  Constitutional: Negative for fever.  HENT: Negative.   Eyes: Negative.   Respiratory: Negative.  Negative for cough and shortness of breath.   Cardiovascular: Negative.  Negative for chest pain and palpitations.  Gastrointestinal: Positive for blood in stool. Negative for abdominal pain.  Genitourinary: Negative.   Musculoskeletal: Negative.   Skin: Negative.   Neurological: Positive for weakness.  Psychiatric/Behavioral: Negative.      SUBJECTIVE:  Uneventful night Feels a little bit better Still feels fatigued Denies any abdominal pain or discomfort   VITAL SIGNS: BP (!) 105/41   Pulse 95   Temp 98.2 F (36.8 C) (Oral)   Resp 20   Ht 6' (1.829 m)   Wt 129.1 kg   SpO2 100%   BMI 38.60 kg/m   INTAKE / OUTPUT: I/O last 3 completed shifts: In: 2378.4 [I.V.:853.4; Blood:732.1; IV Piggyback:792.9] Out: -   PHYSICAL EXAMINATION: General: Elderly gentleman does not appear to be in distress, obese Neuro: Alert oriented x3, moving all extremities HEENT: Moist oral mucosa Cardiovascular: S1-S2 appreciated Lungs: Clear breath sounds, reduced at the bases Abdomen: No tenderness, bowel sounds appreciated Skin: Skin is warm and dry, venous stasis  LABS:  BMET Recent  Labs  Lab 08/19/18 1451 08/19/18 1527 08/20/18 0335  NA 143 141 146*  K 3.9 3.7 3.7  CL 96* 91* 104  CO2 34*  --  33*  BUN 109* 111* 100*  CREATININE 2.15* 2.10* 1.83*  GLUCOSE 205* 202* 158*    Electrolytes Recent Labs  Lab 08/19/18 1451 08/20/18 0335  CALCIUM 8.1* 8.0*    CBC Recent Labs  Lab 08/19/18 1517 08/19/18 1527 08/19/18 2052 08/20/18 0335  WBC 12.6*  --  14.6* 13.2*  HGB 6.7* 8.5* 8.0* 8.0*  HCT 21.3* 25.0* 25.1* 25.1*  PLT 311  --  136* 227    Coag's Recent Labs  Lab 08/19/18 1517  INR 1.18    Sepsis Markers Recent Labs  Lab 08/19/18 1527  LATICACIDVEN 1.20    ABG No results for input(s): PHART, PCO2ART, PO2ART in the last 168 hours.  Liver Enzymes Recent Labs  Lab 08/19/18 1451  AST 9*  ALT 8  ALKPHOS 65  BILITOT 0.3  ALBUMIN 2.1*    Cardiac Enzymes No results for input(s): TROPONINI, PROBNP in the last 168 hours.  Glucose Recent Labs  Lab 08/19/18 1448  GLUCAP 148*    Imaging Dg Chest Portable 1 View  Result Date: 08/19/2018 CLINICAL DATA:  Hypotension.  Rectal bleeding. EXAM: PORTABLE CHEST 1 VIEW COMPARISON:  08/03/2018 and 05/19/2018 FINDINGS: Heart size  and pulmonary vascularity are normal. The lungs are clear. Slight chronic elevation of the left hemidiaphragm with slight chronic atelectasis at the left lung base. No acute bone abnormality. Severe left glenohumeral joint arthritis. Findings consistent with chronic complete rotator cuff tears bilaterally. IMPRESSION: No acute abnormalities. Chronic elevation of the left hemidiaphragm with slight chronic atelectasis at the left lung base. Electronically Signed   By: Lorriane Shire M.D.   On: 08/19/2018 16:10     STUDIES:  X-rays unremarkable  CULTURES: Blood cultures negative to date  ANTIBIOTICS: On oral vancomycin  SIGNIFICANT EVENTS: H&H has been stable Stool is still melanotic  LINES/TUBES: 2 peripheral lines  DISCUSSION: Elderly gentleman with  melanotic stools, C. difficile colitis, GI bleed Has remained hemodynamically stable H&H is stable Post 2 units of transfusion  ASSESSMENT / PLAN:  PULMONARY A: Stable respiratory status Hypoxemia P:   Continue oxygen supplementation Wean as tolerated  CARDIOVASCULAR A:  History of systolic heart failure Hypertension Diastolic heart failure P:  Continue cautious resuscitation Status post 2 unit transfusion   RENAL A:   Acute kidney injury P:   Cautious fluid resuscitation Continue to monitor electrolytes  GASTROINTESTINAL A:   GI bleed C. difficile infection P:   As a transfusions GI evaluation pending Continue PPI  HEMATOLOGIC A:   Chronic anemia Anemia of acute blood loss P:  Continue to monitor  INFECTIOUS A:   Leukocytosis P:   We will continue to follow cultures Cultures negative so far   NEUROLOGIC A:   Alert and oriented, mentating well P:   RASS goal: 0-1  Will plan to transfer to hospitalist service as he has remained hemodynamically stable Continue treatment for C. difficile colitis Monitor H&H   Pulmonary and [place] Pager: [phone]  08/20/2018, 8:54 AM

## 2018-08-20 NOTE — Care Management Note (Signed)
Case Management Note  Patient Details  Name: [name] MRN: [id number] Date of Birth: 1941-01-04  Subjective/Objective:                  GI bleed, melanotic stools recently hospitalized for sepsis o2-Panacea/ iv ns at 100cc/hrs. Hypotensive/ hgb 8.0 rec'd one unit of prbcs. Action/Plan: From snf [facility] Will follow for cm needs. Expected Discharge Date:  (unknown)               Expected Discharge Plan:  Skilled Nursing Facility  In-House Referral:  Clinical Social Work  Discharge planning Services  CM Consult  Post Acute Care Choice:    Choice offered to:     DME Arranged:    DME Agency:     HH Arranged:    [place] Agency:     Status of Service:  In process, will continue to follow  If discussed at Long Length of Stay Meetings, dates discussed:    Additional Comments:  Leeroy Cha, RN 08/20/2018, 9:23 AM

## 2018-08-20 NOTE — Consult Note (Signed)
Referring Provider:  Dr. Ander Slade (PCCM) Primary Care Physician:   Bing, DO Primary Gastroenterologist:  None (previously seen by Dr. Teena Irani)  Reason for Consultation:  GI bleeding  HPI: [name] is a 77 y.o. male with possible remote h/o "ulcer" in [place] many years ago, s/p negative egd 2006 (Dr. Amedeo Plenty, done for dysphagia sx, bx's neg for H. Pylori at that time), admitted last night with hypotension and dark tarry stools, confirmed to be Hemoccult positive, with a drop in hemoglobin from 9.4 to a level of 6.8 over the past several weeks, also with a marked increase in BUN, which rose from a baseline of 9 several weeks ago to a level of 109, with just a moderate rise in creatinine during that same interval.    The patient is on daily aspirin but denies antecedent dyspeptic symptomatology.  He was not on PPI therapy prior to admission.  Since admission, the patient has been hemodynamically stable, without further stools, and with his hemoglobin remaining stable at 8.0 following transfusion.  Of note, the patient was admitted with sepsis several weeks ago and had a 2-minute cardiac arrest.  He does have multiple medical problems including diastolic heart dysfunction, diabetes, history of prostate cancer, and chronic lower extremity venous insufficiency.   Past Medical History:  Diagnosis Date  . Chronic osteomyelitis of lower leg ([facility]) 11/20/2010   Chronic osteoarthritis of right lower leg-10/03/10 note by Dr. Harvie Heck scanned of this visit under media tab. Dr. Sharol Given states that this is chronic osteomyelitis, only cure would be amputation. Patient was not agreeable to this at his visit with Dr. Sharol Given. Patient elected to apply Bactroban cream, Dr. Sharol Given and time of that visit also prescribe doxycycline. Dr. Sharol Given stay the patient she come to the [facility] 02/26/2007   Qualifier: Diagnosis of  By: Erling Cruz  MD, [place]    . Complication of anesthesia   . Diabetes mellitus    . Diabetic peripheral neuropathy associated with type 2 diabetes mellitus ([place]) 02/07/2014  . GERD (gastroesophageal reflux disease)   . Grade II diastolic dysfunction   . Heart murmur    last 2D Echo -03/31/08  . HIATAL HERNIA WITH REFLUX 09/29/2006   Qualifier: Diagnosis of  By: Erling Cruz  MD, MELISSA    . HIP REPLACEMENT, BILATERAL, HX OF 05/12/2007   Qualifier: Diagnosis of  By: Erling Cruz  MD, MELISSA    . History of blood transfusion   . Hypertension   . OSTEOARTHRITIS 03/05/2007   Qualifier: Diagnosis of  By: Erling Cruz  MD, MELISSA    . PONV (postoperative nausea and vomiting)   . Prostate cancer ([place])   . SBO (small bowel obstruction) ([place]) 02/2017  . Seasonal allergies   . VENOUS INSUFFICIENCY, CHRONIC 02/26/2007   Qualifier: Diagnosis of  By: Erling Cruz  MD, MELISSA      Past Surgical History:  Procedure Laterality Date  . COLONOSCOPY    . COLONOSCOPY    . HIP ARTHROPLASTY     bil  . I&D EXTREMITY  08/14/2012   Procedure: IRRIGATION AND DEBRIDEMENT EXTREMITY;  Surgeon: Newt Minion, MD;  Location: [facility];  Service: Orthopedics;  Laterality: Right;  Irrigation and Debridement Right tibia, Placement antibiotic beads  . I&D EXTREMITY Left 03/18/2014   Procedure: IRRIGATION AND DEBRIDEMENT EXTREMITY;  Surgeon: Newt Minion, MD;  Location: [facility];  Service: Orthopedics;  Laterality: Left;  Debridement Left Calf Ulcer, Apply Theraskin and Wound VAC  . IRRIGATION AND  DEBRIDEMENT ABSCESS Left 03/18/2014   DR DUDA  . LEG SURGERY     ROD for fracture  . LEG WOUND REPAIR / CLOSURE     Beads .  Skin wound  . SKIN GRAFT     right leg- from donor skin  . SKIN GRAFT  1976   R wrist   skin graft from left thigh  . UPPER GASTROINTESTINAL ENDOSCOPY    . WISDOM TOOTH EXTRACTION    . WRIST FUSION     right wrist    Prior to Admission medications   Medication Sig Start Date End Date Taking? Authorizing Provider  aspirin 81 MG chewable tablet Chew 81 mg by mouth every other day.    Yes [provider]   collagenase (SANTYL) ointment Apply 1 application topically daily.   Yes [provider]  ferrous sulfate 325 (65 FE) MG tablet Take 1 tablet (325 mg total) by mouth 2 (two) times daily with a meal. 08/07/18  Yes Anderson, Chelsey L, DO  FLOVENT HFA 220 MCG/ACT inhaler INHALE 1 PUFF INTO THE LUNGS TWICE DAILY Patient taking differently: Inhale 1 puff into the lungs 2 (two) times daily.  10/02/17  Yes Mikell, Jeani Sow, MD  fluticasone (FLONASE) 50 MCG/ACT nasal spray Place 2 sprays into both nostrils daily.   Yes [provider]  Infant Care Products (DERMACLOUD) CREA Apply 1 application topically 3 (three) times daily.   Yes [provider]  latanoprost (XALATAN) 0.005 % ophthalmic solution INSTILL 1 DROP IN BOTH EYES EVERY EVENING FOR GLAUCOMA Patient taking differently: Place 1 drop into both eyes at bedtime.  07/08/18  Yes [place] Bing, DO  losartan (COZAAR) 25 MG tablet Take 1 tablet (25 mg total) by mouth daily. 08/08/18  Yes Anderson, Chelsey L, DO  metoprolol tartrate (LOPRESSOR) 25 MG tablet Take 0.5 tablets (12.5 mg total) by mouth 2 (two) times daily. 08/07/18  Yes Anderson, Chelsey L, DO  nystatin (MYCOSTATIN/NYSTOP) powder Apply topically 2 (two) times daily. 07/13/18  Yes Massena Bing, DO  pravastatin (PRAVACHOL) 40 MG tablet TAKE 1 TABLET BY MOUTH DAILY 12/26/17  Yes Mikell, Jeani Sow, MD  Probiotic Product (PROBIOTIC PO) Take 2 capsules by mouth daily.   Yes [provider]  tamsulosin (FLOMAX) 0.4 MG CAPS capsule TAKE 2 CAPSULES BY MOUTH EVERY MORNING Patient taking differently: Take 0.8 mg by mouth daily.  07/08/18  Yes Kiefer Bing, DO  torsemide (DEMADEX) 20 MG tablet Take 2 tablets (40 mg total) by mouth daily. 08/08/18  Yes Anderson, Chelsey L, DO  traMADol (ULTRAM) 50 MG tablet Take 50 mg by mouth every 6 (six) hours as needed for moderate pain or severe pain.   Yes [provider]  urea 10 % lotion Apply topically 2  (two) times daily. 08/07/18  Yes Anderson, Chelsey L, DO  Vancomycin HCl (FIRVANQ) 50 MG/ML SOLR Take 2.5 mLs by mouth 4 (four) times daily.   Yes [provider]  Hydrocortisone (GERHARDT'S BUTT CREAM) CREA Apply 1 application topically 3 (three) times daily. Patient not taking: Reported on 08/19/2018 08/07/18   Doristine Mango L, DO  mometasone (NASONEX) 50 MCG/ACT nasal spray INSTILL 2 SPRAYS INTO THE NOSE DAILY Patient not taking: Reported on 08/19/2018 07/08/18   [place] Bing, DO  PROAIR HFA 108 (90 Base) MCG/ACT inhaler INHALE 2 PUFFS BY MOUTH EVERY 4 HOURS AS NEEDED FOR WHEEZING OR SHORTNESS OF BREATH Patient taking differently: Inhale 2 puffs into the lungs every 4 (four) hours  as needed for wheezing or shortness of breath.  04/18/18   Tonette Bihari, MD    Current Facility-Administered Medications  Medication Dose Route Frequency Provider Last Rate Last Dose  . 0.9 %  sodium chloride infusion  250 mL Intravenous PRN Olalere, Adewale A, MD      . 0.9 %  sodium chloride infusion   Intravenous Continuous Olalere, Adewale A, MD 100 mL/hr at 08/20/18 0533    . chlorhexidine (PERIDEX) 0.12 % solution 15 mL  15 mL Mouth Rinse BID Olalere, Adewale A, MD   15 mL at 08/20/18 0902  . MEDLINE mouth rinse  15 mL Mouth Rinse q12n4p Olalere, Adewale A, MD      . pantoprazole (PROTONIX) injection 40 mg  40 mg Intravenous Q12H Olalere, Adewale A, MD   40 mg at 08/20/18 0902  . vancomycin (VANCOCIN) 50 mg/mL oral solution 125 mg  125 mg Oral QID Olalere, Adewale A, MD   125 mg at 08/20/18 0900    Allergies as of 08/19/2018 - Review Complete 08/19/2018  Allergen Reaction Noted  . Ace inhibitors Cough 03/05/2007  . Angiotensin receptor blockers Other (See Comments) 03/05/2007  . Metformin Diarrhea 03/05/2007    Family History  Problem Relation Age of Onset  . Hypertension Mother     Social History   Socioeconomic History  . Marital status: Widowed    Spouse name: Not on file   . Number of children: 4  . Years of education: 39  . Highest education level: Not on file  Occupational History  . Occupation: retired- Administrator  Social Needs  . Financial resource strain: Not hard at all  . Food insecurity:    Worry: Never true    Inability: Never true  . Transportation needs:    Medical: No    Non-medical: No  Tobacco Use  . Smoking status: Never Smoker  . Smokeless tobacco: Never Used  Substance and Sexual Activity  . Alcohol use: No  . Drug use: No  . Sexual activity: Not on file  Lifestyle  . Physical activity:    Days per week: 0 days    Minutes per session: 0 min  . Stress: Not at all  Relationships  . Social connections:    Talks on phone: More than three times a week    Gets together: More than three times a week    Attends religious service: Never    Active member of club or organization: No    Attends meetings of clubs or organizations: Not on file    Relationship status: Widowed  . Intimate partner violence:    Fear of current or ex partner: Not on file    Emotionally abused: Not on file    Physically abused: Not on file    Forced sexual activity: Not on file  Other Topics Concern  . Not on file  Social History Narrative   Health Care POA:    Emergency Contact: daughter, Cheri Rous [phone]   End of Life Plan: gave pt AD pamphlet   Who lives with you: self   Any pets: none   Diet: pt has a varied diet of protein, starch and vegetables.    Exercise: Pt does not have regular exercise routine.  We discussed starting a regular chair exercise program.   Seatbelts: Pt reports wearing seatbelt when in vehicles.    Hobbies: watching car races          Review of Systems: See history of  present illness.  Physical Exam: Vital signs in last 24 hours: Temp:  [96.8 F (36 C)-98.2 F (36.8 C)] 98.2 F (36.8 C) (08/22 0800) Pulse Rate:  [28-132] 94 (08/22 1114) Resp:  [12-33] 23 (08/22 1114) BP: (53-130)/(32-78) 100/72 (08/22  1114) SpO2:  [6 %-100 %] 97 % (08/22 1114) Weight:  [126 kg-129.1 kg] 129.1 kg (08/22 0500) Last BM Date: 08/20/18  This is a pleasant, somewhat chronically ill appearing elderly African-American male lying in bed, no acute distress, perhaps some mild dyspnea, anicteric, without frank pallor, skin warm, cognitively intact, no overt focal neurologic deficits, decreased breath sounds anteriorly bilaterally, cardiac exam unremarkable, abdomen obese but without mass-effect or tenderness, lower extremities with chronic edema (wrapped).  Intake/Output from previous day: 08/21 0701 - 08/22 0700 In: 2378.4 [I.V.:853.4; Blood:732.1; IV Piggyback:792.9] Out: -  Intake/Output this shift: No intake/output data recorded.  Lab Results: Recent Labs    08/19/18 1517 08/19/18 1527 08/19/18 2052 08/20/18 0335  WBC 12.6*  --  14.6* 13.2*  HGB 6.7* 8.5* 8.0* 8.0*  HCT 21.3* 25.0* 25.1* 25.1*  PLT 311  --  136* 227   BMET Recent Labs    08/19/18 1451 08/19/18 1527 08/20/18 0335  NA 143 141 146*  K 3.9 3.7 3.7  CL 96* 91* 104  CO2 34*  --  33*  GLUCOSE 205* 202* 158*  BUN 109* 111* 100*  CREATININE 2.15* 2.10* 1.83*  CALCIUM 8.1*  --  8.0*   LFT Recent Labs    08/19/18 1451  PROT 6.4*  ALBUMIN 2.1*  AST 9*  ALT 8  ALKPHOS 65  BILITOT 0.3   PT/INR Recent Labs    08/19/18 1517  LABPROT 14.9  INR 1.18    Studies/Results: Dg Chest Portable 1 View  Result Date: 08/19/2018 CLINICAL DATA:  Hypotension.  Rectal bleeding. EXAM: PORTABLE CHEST 1 VIEW COMPARISON:  08/03/2018 and 05/19/2018 FINDINGS: Heart size and pulmonary vascularity are normal. The lungs are clear. Slight chronic elevation of the left hemidiaphragm with slight chronic atelectasis at the left lung base. No acute bone abnormality. Severe left glenohumeral joint arthritis. Findings consistent with chronic complete rotator cuff tears bilaterally. IMPRESSION: No acute abnormalities. Chronic elevation of the left  hemidiaphragm with slight chronic atelectasis at the left lung base. Electronically Signed   By: Lorriane Shire M.D.   On: 08/19/2018 16:10    Impression: 1.  Subacute GI bleeding characterized by melenic stools, now apparently quiescent 2.  Posthemorrhagic anemia, acute 3.  Azotemia, acute  Plan: I discussed the rationale and purpose of endoscopic evaluation with the patient but he declines the procedure at this time.  He is willing to consider it if his condition should change.  Therefore, I would advocate continuation of current management with observation and IV PPI therapy.  I will allow a clear liquid diet today, with the thought of advancing it tomorrow to a heart healthy diet if the patient remains without evidence of further significant bleeding.  In the event of acute or destabilizing or ongoing hemorrhage, endoscopic evaluation would be very reasonable and I think the patient would be agreeable in that circumstance.   LOS: 1 day   [place] V  08/20/2018, 11:41 AM   Pager [phone] If no answer or after 5 PM call [phone]

## 2018-08-20 NOTE — Consult Note (Addendum)
   [facility] CM Inpatient Consult   08/20/2018  [name] 12-03-1941 [id number]   [name] is active with [facility] services. He has been followed by [facility] LCSW while he was at [facility].    [name] could also benefit from goals of care/palliative care consult due to frequent hospitalizations and ongoing decline.   Chart reviewed. Noted [name] is currently in ICU at [facility]. Will continue to follow for disposition plans and progression.  Will make inpatient RNCM aware of above notes.    Marthenia Rolling, MSN-Ed, RN,BSN [facility] Liaison [phone]

## 2018-08-20 NOTE — Consult Note (Signed)
[place] Nurse wound consult note Evaluation of patient completed in [place] with the assistance of the primary nightshift and dayshift RNs. Reason for Consult: Venous stasis ulcers bilateral lower legs Wound type: Venous stasis to right shin.  This wound measures 0.8 cm x 0.8 cm and is 100% covered in yellow slough.  There is an unstagable wound to plantar, lateral surface of left heel.  This wound measures 2.2 cm x 2.6 cm and is 100% covered in yellow/[place] slough.  I do not detect an odor to any wound area. There is a large fluid filled area on both lower legs just below the knees.   There area chronic skin changes associated with venous stasis and lichenification particularly of the right foot. Dressing procedure/placement/frequency: Foam dressing over wounds and any draining areas, then unna boots bilaterally.  To be changed every 5 days. Monitor the wound area(s) for worsening of condition such as: Signs/symptoms of infection,  Increase in size,  Development of or worsening of odor, Development of pain, or increased pain at the affected locations.  Notify the medical team if any of these develop.  Thank you for the consult.  Discussed plan of care with the patient and bedside nurse.  [place] nurse will not follow at this time.  Please re-consult the [place] team if needed.  Val Riles, RN, MSN, CWOCN, CNS-BC, pager [phone]

## 2018-08-21 DIAGNOSIS — I5021 Acute systolic (congestive) heart failure: Secondary | ICD-10-CM

## 2018-08-21 DIAGNOSIS — L8992 Pressure ulcer of unspecified site, stage 2: Secondary | ICD-10-CM

## 2018-08-21 LAB — GLUCOSE, CAPILLARY
GLUCOSE-CAPILLARY: 139 mg/dL — AB (ref 70–99)
Glucose-Capillary: 149 mg/dL — ABNORMAL HIGH (ref 70–99)
Glucose-Capillary: 198 mg/dL — ABNORMAL HIGH (ref 70–99)

## 2018-08-21 LAB — TYPE AND SCREEN
ABO/RH(D): O POS
ANTIBODY SCREEN: NEGATIVE
Unit division: 0
Unit division: 0
Unit division: 0

## 2018-08-21 LAB — BPAM RBC
BLOOD PRODUCT EXPIRATION DATE: 201909142359
BLOOD PRODUCT EXPIRATION DATE: 201909202359
Blood Product Expiration Date: 201909212359
ISSUE DATE / TIME: 201908211629
ISSUE DATE / TIME: 201908211514
ISSUE DATE / TIME: 201908221725
UNIT TYPE AND RH: 5100
Unit Type and Rh: 5100
Unit Type and Rh: 9500

## 2018-08-21 LAB — URINE CULTURE

## 2018-08-21 LAB — HEMOGLOBIN AND HEMATOCRIT, BLOOD
HCT: 25.6 % — ABNORMAL LOW (ref 39.0–52.0)
Hemoglobin: 8.2 g/dL — ABNORMAL LOW (ref 13.0–17.0)

## 2018-08-21 MED ORDER — INSULIN ASPART 100 UNIT/ML ~~LOC~~ SOLN
0.0000 [IU] | Freq: Three times a day (TID) | SUBCUTANEOUS | Status: DC
  Administered 2018-08-21 – 2018-08-22 (×3): 1 [IU] via SUBCUTANEOUS
  Administered 2018-08-22: 2 [IU] via SUBCUTANEOUS
  Administered 2018-08-23 (×2): 1 [IU] via SUBCUTANEOUS
  Administered 2018-08-24: 2 [IU] via SUBCUTANEOUS
  Administered 2018-08-24: 1 [IU] via SUBCUTANEOUS
  Administered 2018-08-24: 2 [IU] via SUBCUTANEOUS
  Administered 2018-08-25: 1 [IU] via SUBCUTANEOUS
  Administered 2018-08-25: 2 [IU] via SUBCUTANEOUS
  Administered 2018-08-25: 1 [IU] via SUBCUTANEOUS
  Administered 2018-08-26: 2 [IU] via SUBCUTANEOUS
  Administered 2018-08-26: 1 [IU] via SUBCUTANEOUS

## 2018-08-21 MED ORDER — FAMOTIDINE 20 MG PO TABS
20.0000 mg | ORAL_TABLET | Freq: Every day | ORAL | Status: DC
  Administered 2018-08-21 – 2018-08-26 (×6): 20 mg via ORAL
  Filled 2018-08-21 (×6): qty 1

## 2018-08-21 MED ORDER — PRAVASTATIN SODIUM 40 MG PO TABS
40.0000 mg | ORAL_TABLET | Freq: Every day | ORAL | Status: DC
  Administered 2018-08-21 – 2018-08-25 (×5): 40 mg via ORAL
  Filled 2018-08-21 (×5): qty 1

## 2018-08-21 MED ORDER — SODIUM CHLORIDE 0.45 % IV SOLN
INTRAVENOUS | Status: DC
  Administered 2018-08-21 – 2018-08-25 (×3): via INTRAVENOUS

## 2018-08-21 NOTE — Progress Notes (Signed)
Patient to transfer to 4E 1416 report given to receiving nurse Pam, all questions answered at this time.  Pt. VSS with no s/s of distress noted.  RN requested holding patient until a low airloss bed can be placed in patients room.

## 2018-08-21 NOTE — Progress Notes (Addendum)
PROGRESS NOTE    [name]  MLY:[id number] DOB: February 05, 1941 DOA: 08/19/2018 PCP: [place] Bing, DO    Brief Narrative:  77 yo male who was admitted due to melena and hypotension.  He does have the significant past medical history for chronic osteomyelitis, systolic heart failure, type 2 diabetes mellitus, GERD, hiatal hernia and hypertension.  He was currently treated for C. difficile colitis, and noticed at home melanotic stools.  Recent hospitalization for sepsis (urine/ pneumonia/ soft tissue), cardiac arrest August, 2, 2019.  On his initial physical examination he was hypotensive 80/50, heart rate 83, temperature 96.8, respiratory rate 18, oxygen saturation 100%, moist mucous membranes, lungs clear to auscultation bilaterally, decreased air movement, heart S1-S2 present rhythmic, no gallops, rubs or murmurs, the abdomen was soft nontender, positive bowel sounds, no lower extremity edema.  Sodium 143, potassium 3.9, chloride 96, bicarb 34, glucose 205, BUN 109, creatinine 2.15, white count 12.6, hemoglobin 6.7, hematocrit 21.3, platelets 311, urine analysis specific gravity 1.014, 0-5 white cells.  Chest x ray with left pleural effusion with associated atelectasis.  EKG sinus tachycardia, left axis deviation, precordial V2 to V6 new T wave inversions.   Patient was admitted to the intensive care unit with a working diagnosis of hypovolemic shock due to acute blood loss anemia complicated by acute kidney injury.  Assessment & Plan:   Active Problems:   Acute GI bleeding   Pressure injury of skin   1. Hypovolemic shock due to GI fluid losses and acute blood loss anemia due to lower GI bleed. Patient has remained hemodynamically stable this am, few episodic hypotension while sleeping. He remains high risk for recurrent shock, will continue IV fluids and blood pressure monitoring. Hb this am 8.2 with Hct 25,6 after total of 3 units PRBC transfusion. Continue hb and hct monitoring. No signs  of further bleeding today. Will continue to hold on aspirin. Blood pressure 111 to 121 mmHg, continue to have tachypnea 20 to 30 bpm.   2. C  Diff colitis/ diarrhea (present on admission). Continue enteral vancomycin, will advance diet as tolerated, no nausea or vomiting, improved appetite and abdominal pain. Change pantoprazole IV to po famotidine.   3. Pre renal - AKI with hyperNatremia and hyperchloremia. Renal function with serum cr at 1,83 with Na at 146, cl 104. K at 3,7 and serum bicarbonate at 33. Will changed fluid to half normal saline to prevent worsening hypernatremia and hyperchloremia. Follow renal panel in am. Urine output over last 24 hours 1,100 ml.    4. Anemia of chronic disease with iron deficiency anemia. Will check iron levels, hold on oral Fe sulfate for now.   5. Systolic heart failure/ chronic, EF 30 to 35%/ ischemic cardiomyopathy (not with exacerbation). Will continue to hold on losartan, metoprolol and torsemide. EKG with new precordial T wave inversions, will repeat ekg today. No chest pain. No signs of volume overload. Systolic blood pressure 812 to 121.   6. Chronic venous insufficiency with pressure decubitus ulcers (present on admission). Multiple stage 2 ulcers, present on admission, unna boots in place, continue local wound care, will need snf at discharge. Nutrition and physical therapy consultations.   7. Dyslipidemia. Resume pravastatin.   8. T2DM. Will add insulin sliding scale for glucose cover and monitoring, further calculation of insulin requirements.   9. Multiple stage 2 ulcers on bilateral legs and sacrum, present on admission.   DVT prophylaxis: unna boots   Code Status:  Full  Family Communication: no family  at the bedside  Disposition Plan/ discharge barriers:  Transfer to telemetry    Consultants:   GI   Procedures:     Antimicrobials:   Oral Vancomycin     Subjective: Patient is feeling better, but continue to be very weak and  deconditioned, diarrhea is persistent but decreased intensity, no nausea or vomiting, improved appetite.   Objective: Vitals:   08/21/18 0300 08/21/18 0400 08/21/18 0500 08/21/18 0600  BP: (!) 87/52 (!) 99/45 (!) 102/54 (!) 111/51  Pulse: 98 92 94 93  Resp: (!) 22 (!) 22 (!) 30 (!) 22  Temp:  97.9 F (36.6 C)    TempSrc:  Axillary    SpO2: 100% 94% 96% 99%  Weight:   132.8 kg   Height:        Intake/Output Summary (Last 24 hours) at 08/21/2018 0801 Last data filed at 08/21/2018 0600 Gross per 24 hour  Intake 2016.29 ml  Output 1100 ml  Net 916.29 ml   Filed Weights   08/19/18 2000 08/20/18 0500 08/21/18 0500  Weight: 126 kg 129.1 kg 132.8 kg    Examination:   General:. Deconditioned and ill looking appearing  Neurology: Awake and alert, non focal  E ENT: positive pallor, no icterus, oral mucosa moist Cardiovascular: No JVD. S1-S2 present, rhythmic, no gallops, rubs, or murmurs. +++ bilateral lower extremity edema/ unna boots in place.  Pulmonary: decreased breath sounds bilaterally at bases, adequate air movement, no wheezing, rhonchi or rales. Gastrointestinal. Abdomen protuberant with no organomegaly, non tender, no rebound or guarding Skin. No rashes Musculoskeletal: no joint deformities     Data Reviewed: I have personally reviewed following labs and imaging studies  CBC: Recent Labs  Lab 08/19/18 1517 08/19/18 1527 08/19/18 2052 08/20/18 0335 08/21/18 0734  WBC 12.6*  --  14.6* 13.2*  --   NEUTROABS 10.3*  --   --   --   --   HGB 6.7* 8.5* 8.0* 8.0* 8.2*  HCT 21.3* 25.0* 25.1* 25.1* 25.6*  MCV 83.9  --  83.7 84.2  --   PLT 311  --  136* 227  --    Basic Metabolic Panel: Recent Labs  Lab 08/19/18 1451 08/19/18 1527 08/20/18 0335  NA 143 141 146*  K 3.9 3.7 3.7  CL 96* 91* 104  CO2 34*  --  33*  GLUCOSE 205* 202* 158*  BUN 109* 111* 100*  CREATININE 2.15* 2.10* 1.83*  CALCIUM 8.1*  --  8.0*   GFR: Estimated Creatinine Clearance: 46.9 mL/min  (A) (by C-G formula based on SCr of 1.83 mg/dL (H)). Liver Function Tests: Recent Labs  Lab 08/19/18 1451  AST 9*  ALT 8  ALKPHOS 65  BILITOT 0.3  PROT 6.4*  ALBUMIN 2.1*   Recent Labs  Lab 08/19/18 1517  LIPASE 21   No results for input(s): AMMONIA in the last 168 hours. Coagulation Profile: Recent Labs  Lab 08/19/18 1517  INR 1.18   Cardiac Enzymes: No results for input(s): CKTOTAL, CKMB, CKMBINDEX, TROPONINI in the last 168 hours. BNP (last 3 results) No results for input(s): PROBNP in the last 8760 hours. HbA1C: No results for input(s): HGBA1C in the last 72 hours. CBG: Recent Labs  Lab 08/19/18 1448  GLUCAP 148*   Lipid Profile: No results for input(s): CHOL, HDL, LDLCALC, TRIG, CHOLHDL, LDLDIRECT in the last 72 hours. Thyroid Function Tests: No results for input(s): TSH, T4TOTAL, FREET4, T3FREE, THYROIDAB in the last 72 hours. Anemia Panel: No results for input(s): VITAMINB12,  FOLATE, FERRITIN, TIBC, IRON, RETICCTPCT in the last 72 hours.    Radiology Studies: I have reviewed all of the imaging during this hospital visit personally     Scheduled Meds: . chlorhexidine  15 mL Mouth Rinse BID  . mouth rinse  15 mL Mouth Rinse q12n4p  . pantoprazole  40 mg Intravenous Q12H  . vancomycin  125 mg Oral QID   Continuous Infusions: . sodium chloride    . sodium chloride 100 mL/hr at 08/21/18 0509     LOS: 2 days        Tawni Millers, MD [facility] Pager [phone]

## 2018-08-21 NOTE — Care Management Note (Signed)
Case Management Note  Patient Details  Name: [name] MRN: [id number] Date of Birth: 19-Nov-1941  Subjective/Objective:                    Action/Plan:   Expected Discharge Date:  (unknown)               Expected Discharge Plan:  Skilled Nursing Facility  In-House Referral:  Clinical Social Work  Discharge planning Services  CM Consult  Post Acute Care Choice:    Choice offered to:     DME Arranged:    DME Agency:     HH Arranged:    [place] Agency:     Status of Service:  In process, will continue to follow  If discussed at Long Length of Stay Meetings, dates discussed:    Additional Comments:  Leeroy Cha, RN 08/21/2018, 9:07 AM

## 2018-08-21 NOTE — Progress Notes (Signed)
Hemoglobin stable.  No significant diarrhea.  Agree with dietary advancement.  When it comes time for discharge, I would recommend that the patient be maintained on antipeptic therapy (H2 blocker, such as famotidine, is reasonable in view of his coexisting C. difficile infection) for a period of 2 months, to be on the safe side.  Famotidine 40 mg once daily would be an adequate dosage.    If ongoing therapy with ulcerogenic medication, such as low-dose aspirin, is felt to be clinically very important in this patient, it could be resumed with reasonable safety beginning 2 to 4 weeks from now, but in that case, I would recommend switching from H2 blocker therapy to PPI therapy as prophylaxis (H2 blockers have not been shown to reliably prevent gastric ulceration in the setting of ulcerogenic medications).  We will sign off.  Please call if you have questions or we can be of further assistance in this patient's care.  Cleotis Nipper, M.D. Pager [phone] If no answer or after 5 PM call [phone]

## 2018-08-21 NOTE — Clinical Social Work Note (Addendum)
Clinical Social Work Assessment  Patient Details  Name: [name] MRN: [id number] Date of Birth: 1941/05/04  Date of referral:                  Reason for consult:                   Permission sought to share information with:    Permission granted to share information::     Name::      [name]  Agency::    SNF-[facility]  Relationship::   Daughter  Contact Information:    [phone]  Housing/Transportation Living arrangements for the past 2 months:   Single Family/ SNF facility  Source of Information:   Patient and daughter Patient Interpreter Needed:   None Criminal Activity/Legal Involvement Pertinent to Current Situation/Hospitalization:    Significant Relationships:   Daughter  Lives with:   Self Do you feel safe going back to the place where you live?   Yes Need for family participation in patient care:   Yes  Care giving concerns:  Patient admitted for GI Bleed, melanotic stools.  SNF placement at discharge.  No concerns from the patient or daughter. Patient will discharge back to [facility] to continue short rehab and wound care.   Per notes the patient is followed by [facility] the Community.   Social Worker assessment / plan:  Assessment was also completed on 8/9. Patient admitted from [facility]. CSW will assist with patient discharge back to [facility] at discharge. CSW explain insurance will need to be authorize therapy at SNF again.  Patient will need FL2 completed.  Plan: SNF placement.   Employment status:   Retired Teacher, music PT Recommendations:   SNF placement Information / Referral to community resources:   None  Patient/Family's Response to care: Agreeable and Responding to care.   Patient/Family's Understanding of and Emotional Response to Diagnosis, Current Treatment, and Prognosis:  Patient is learning about his diagnosis and asking questions about his care. Patient  deferred to his daughter for understanding as well.   Emotional Assessment Appearance:   Appears stated Age Attitude/Demeanor/Rapport:   Pleasant Affect (typically observed):    Orientation:   Oriented Self, Oriented Place, Oriented Time,  Oriented Situation Alcohol / Substance use:    Not Applicable  Psych involvement (Current and /or in the community):   No  Discharge Needs  Concerns to be addressed:   Care Coordination Readmission within the last 30 days:   Yes Current discharge risk:   Dependent with mobility Barriers to Discharge:   Continued Medical Work Up/Insurance Authorization    [name], [place] 08/21/2018, 12:33 PM

## 2018-08-21 NOTE — Progress Notes (Signed)
Initial Nutrition Assessment  DOCUMENTATION CODES:   Non-severe (moderate) malnutrition in context of chronic illness, Obesity unspecified  INTERVENTION:  - Will order Magic cup TID with meals, each supplement provides 290 kcal and 9 grams of protein - Will order daily multivitamin with minerals.  - Continue to encourage PO intakes.   NUTRITION DIAGNOSIS:   Moderate Malnutrition related to chronic illness(chronic osteomyelitis) as evidenced by mild fat depletion, moderate muscle depletion.  GOAL:   Patient will meet greater than or equal to 90% of their needs  MONITOR:   PO intake, Supplement acceptance, Weight trends, Labs, Skin  REASON FOR ASSESSMENT:   Consult Assessment of nutrition requirement/status  ASSESSMENT:   77 yo male who was admitted due to melena and hypotension, hypovolemic shock d/t acute blood loss anemia complicated by AKI. He has PMH significant for He chronic osteomyelitis, CHF, type 2 DM, GERD, hiatal hernia, and HTN.  He was currently treated for C. difficile colitis, and noticed at home melanotic stools. Recent hospitalization for sepsis and cardiac arrest 07/31/18.  Patient was on CLD for breakfast and reports that he had a bowl each of chicken and beef broth, an [place] ice, and a cup of coffee. Diet advanced from CLD to Heart Healthy/Carb Modified at 8:44 AM. Patient is aware of diet advancement and plans to try solid foods at lunch. This RD saw patient at [facility] on 8/5. Patient states after d/c from [facility] he went to [facility] for rehab. He reports the food was okay but his appetite has been poor. He was not provided with oral nutrition supplements at rehab, but has tried Ensure in the past and it "ran right through" him. He likes ice cream and is interested in orange cream Magic Cup. Patient denies abdominal pain or nausea associated with diarrhea. He states that diarrhea is ongoing at this time.   Per chart review, current weight is 292 lb and  weight on 6/8 was 316 lb. This indicates 24 lb weight loss (7.5% body weight) in the past 2.5 months; significant for time frame.   Medications reviewed; 20 mg oral Pepcid/day, sliding scale Novolog. Labs reviewed; Na: 146 mmol/L, BUN: 100 mg/dL, creatinine: 1.83 mg/dL, Ca: 8 mg/dL, GFR: 39 mL/min. IVF: 1/2 NS @ 50 mL/hr.        NUTRITION - FOCUSED PHYSICAL EXAM:    Most Recent Value  Orbital Region  No depletion  Upper Arm Region  Mild depletion  Thoracic and Lumbar Region  No depletion  Buccal Region  Mild depletion  Temple Region  No depletion  Clavicle Bone Region  Moderate depletion  Clavicle and Acromion Bone Region  Moderate depletion  Scapular Bone Region  No depletion  Dorsal Hand  No depletion  Patellar Region  No depletion  Anterior Thigh Region  No depletion  Posterior Calf Region  Unable to assess  Edema (RD Assessment)  Mild  Hair  Reviewed  Eyes  Reviewed  Mouth  Reviewed  Skin  Reviewed  Nails  Reviewed       Diet Order:   Diet Order            Diet heart healthy/carb modified Room service appropriate? Yes; Fluid consistency: Thin  Diet effective now              EDUCATION NEEDS:   No education needs have been identified at this time  Skin:  Skin Assessment: Skin Integrity Issues: Skin Integrity Issues:: Unstageable Stage II: L heel, L thigh, coccyx, sacrum  Unstageable: full thickness to R toe  Last BM:  8/23  Height:   Ht Readings from Last 1 Encounters:  08/19/18 6' (1.829 m)    Weight:   Wt Readings from Last 1 Encounters:  08/21/18 132.8 kg    Ideal Body Weight:  80.91 kg  BMI:  Body mass index is 39.71 kg/m.  Estimated Nutritional Needs:   Kcal:  5868-2574 (15-17 kcal/kg)  Protein:  120-130 grams  Fluid:  >/= 2 L/day     Jarome Matin, MS, RD, LDN, [facility] Inpatient Clinical Dietitian Pager # [phone] After hours/weekend pager # [phone]

## 2018-08-21 NOTE — Care Management Note (Signed)
Case Management Note  Patient Details  Name: [name] MRN: [id number] Date of Birth: 02/24/1941  Subjective/Objective:                  Acute GI bleeding   Pressure injury of skin  Action/Plan: Transferring to telemetry  Expected Discharge Date:  (unknown)               Expected Discharge Plan:  Skilled Nursing Facility  In-House Referral:  Clinical Social Work  Discharge planning Services  CM Consult  Post Acute Care Choice:    Choice offered to:     DME Arranged:    DME Agency:     HH Arranged:    [place] Agency:     Status of Service:  In process, will continue to follow  If discussed at Long Length of Stay Meetings, dates discussed:    Additional Comments:  Leeroy Cha, RN 08/21/2018, 8:57 AM

## 2018-08-22 DIAGNOSIS — R578 Other shock: Secondary | ICD-10-CM | POA: Diagnosis not present

## 2018-08-22 LAB — CBC WITH DIFFERENTIAL/PLATELET
BASOS PCT: 0 %
Basophils Absolute: 0 10*3/uL (ref 0.0–0.1)
EOS ABS: 0.7 10*3/uL (ref 0.0–0.7)
Eosinophils Relative: 7 %
HEMATOCRIT: 25.5 % — AB (ref 39.0–52.0)
Hemoglobin: 8 g/dL — ABNORMAL LOW (ref 13.0–17.0)
LYMPHS ABS: 1.7 10*3/uL (ref 0.7–4.0)
Lymphocytes Relative: 17 %
MCH: 28.2 pg (ref 26.0–34.0)
MCHC: 31.4 g/dL (ref 30.0–36.0)
MCV: 89.8 fL (ref 78.0–100.0)
MONO ABS: 0.7 10*3/uL (ref 0.1–1.0)
MONOS PCT: 7 %
NEUTROS ABS: 7.1 10*3/uL (ref 1.7–7.7)
Neutrophils Relative %: 69 %
Platelets: 230 10*3/uL (ref 150–400)
RBC: 2.84 MIL/uL — ABNORMAL LOW (ref 4.22–5.81)
RDW: 17.5 % — AB (ref 11.5–15.5)
WBC: 10.3 10*3/uL (ref 4.0–10.5)

## 2018-08-22 LAB — BASIC METABOLIC PANEL
Anion gap: 5 (ref 5–15)
BUN: 39 mg/dL — AB (ref 8–23)
CALCIUM: 8.6 mg/dL — AB (ref 8.9–10.3)
CO2: 31 mmol/L (ref 22–32)
CREATININE: 0.89 mg/dL (ref 0.61–1.24)
Chloride: 112 mmol/L — ABNORMAL HIGH (ref 98–111)
GFR calc non Af Amer: >60 mL/min (ref >60)
Glucose, Bld: 145 mg/dL — ABNORMAL HIGH (ref 70–99)
Potassium: 4 mmol/L (ref 3.5–5.1)
Sodium: 148 mmol/L — ABNORMAL HIGH (ref 135–145)

## 2018-08-22 LAB — IRON AND TIBC
Iron: 29 ug/dL — ABNORMAL LOW (ref 45–182)
Saturation Ratios: 16 % — ABNORMAL LOW (ref 17.9–39.5)
TIBC: 177 ug/dL — AB (ref 250–450)
UIBC: 148 ug/dL

## 2018-08-22 LAB — GLUCOSE, CAPILLARY
GLUCOSE-CAPILLARY: 119 mg/dL — AB (ref 70–99)
GLUCOSE-CAPILLARY: 160 mg/dL — AB (ref 70–99)
GLUCOSE-CAPILLARY: 169 mg/dL — AB (ref 70–99)
Glucose-Capillary: 125 mg/dL — ABNORMAL HIGH (ref 70–99)

## 2018-08-22 LAB — TRANSFERRIN: TRANSFERRIN: 126 mg/dL — AB (ref 180–329)

## 2018-08-22 LAB — FERRITIN: FERRITIN: 176 ng/mL (ref 24–336)

## 2018-08-22 MED ORDER — FERROUS SULFATE 325 (65 FE) MG PO TBEC: 325.0000 mg | DELAYED_RELEASE_TABLET | Freq: Three times a day (TID) | ORAL | 3 refills | Status: DC

## 2018-08-22 MED ORDER — TRAMADOL HCL 50 MG PO TABS: 50.0000 mg | ORAL_TABLET | Freq: Four times a day (QID) | ORAL | 0 refills | Status: DC | PRN

## 2018-08-22 MED ORDER — VANCOMYCIN 50 MG/ML ORAL SOLUTION: 125.0000 mg | Freq: Four times a day (QID) | ORAL | 1 refills | Status: DC

## 2018-08-22 MED ORDER — FAMOTIDINE 20 MG PO TABS: 40.0000 mg | ORAL_TABLET | Freq: Every day | ORAL | 1 refills | Status: AC

## 2018-08-22 NOTE — NC FL2 (Signed)
[place] LEVEL OF CARE SCREENING TOOL     IDENTIFICATION  Patient Name: [name] Birthdate: 03-Jun-1941 Sex: male Admission Date (Current Location): 08/19/2018  [facility] and [place] Number:  Herbalist and Address:  [facility],  [place] [address] [place], [place]      Provider Number: [id number]  Attending Physician Name and Address:  Georgette Shell, MD  Relative Name and Phone Number:  Cheri Rous: [phone]    Current Level of Care: Hospital Recommended Level of Care: [place] Prior Approval Number:    Date Approved/Denied:   PASRR Number: [id number]  Discharge Plan: SNF    Current Diagnoses: Patient Active Problem List   Diagnosis Date Noted  . Hemorrhagic shock ([place])   . Pressure injury of skin 08/20/2018  . Gastrointestinal hemorrhage 08/19/2018  . Acute respiratory failure with hypoxemia ([place])   . Possible OSA; on empiric CPAP 08/03/2018  . Fungus present in urine 08/03/2018  . Abdominal aortic atherosclerosis ([place]) 07/29/2018  . Pyuria 07/29/2018  . Sacral decubitus ulcer, stage III ([place]) 07/29/2018  . Somnolence 07/29/2018  . Acute respiratory acidosis 07/29/2018  . Poor personal hygiene 07/29/2018  . Hemoglobin decreased 07/29/2018  . Sepsis ([place]) 07/28/2018  . Pneumonia 06/04/2018  . Dehydration   . Possible HCAP (healthcare-associated pneumonia)   . Hypotension   . Grade II diastolic dysfunction   . Fluid overload 05/19/2018  . Elephantiasis nostra verrucosa 05/19/2018  . Pressure ulcer of upper thigh, right, stage III ([place]) 05/19/2018  . Candidal intertrigo 05/19/2018  . Chronic venous insufficiency, Bilateral Legs 05/19/2018  . Elevated PSA, greater than or equal to 20 ng/ml 05/19/2018  . History of prostate surgery, Per Patient report, [facility] 05/19/2018  . Infestation by bed bug 05/19/2018  . Lives alone with limited help 05/19/2018  . Morbid obesity ([place])  05/19/2018  . Dyspnea and respiratory abnormality 05/19/2018  . Systolic ejection murmur with radiation into Cartoids bilaterally 05/19/2018  . Paget's bone disease 05/19/2018  . Pulmonary hypertension ([place]) 05/19/2018  . Hypoxia   . AKI (acute kidney injury) ([place])   . Impaired mobility 09/09/2014  . Ulcer of left lower leg ([place]) 10/05/2012  . Palliative care encounter 10/05/2012  . Anemia of chronic disease 05/21/2012  . Chronic leg pain 11/17/2011  . HLD (hyperlipidemia) 12/01/2008  . ADENOCARCINOMA, PROSTATE 05/24/2008  . Morbid obesity with body mass index of 50.0-59.9 in adult ([place]) 03/05/2007  . Essential hypertension 03/05/2007    Orientation RESPIRATION BLADDER Height & Weight     Self, Situation, Place, Time  (2L) External catheter Weight: (!) 333 lb (151 kg)(New bed weight. RN notified ) Height:  6' (182.9 cm)  BEHAVIORAL SYMPTOMS/MOOD NEUROLOGICAL BOWEL NUTRITION STATUS      Incontinent Diet(Regular)  AMBULATORY STATUS COMMUNICATION OF NEEDS Skin   Extensive Assist Verbally Other (Comment)(Heel ulcer)                       Personal Care Assistance Level of Assistance  Bathing, Feeding, Dressing Bathing Assistance: Maximum assistance Feeding assistance: Independent Dressing Assistance: Limited assistance     Functional Limitations Info  Sight, Hearing, Speech Sight Info: Adequate Hearing Info: Adequate Speech Info: Adequate    SPECIAL CARE FACTORS FREQUENCY  PT (By licensed PT), OT (By licensed OT)     PT Frequency: 5x/week OT Frequency: 5x/week            Contractures Contractures Info: Not present  Additional Factors Info  Code Status, Allergies Code Status Info: Full Allergies Info: Ace Inhibitors, Angiotensin Receptor Blockers, Metformin           Current Medications (08/22/2018):  This is the current hospital active medication list Current Facility-Administered Medications  Medication Dose Route Frequency Provider Last Rate Last  Dose  . 0.45 % sodium chloride infusion   Intravenous Continuous Arrien, Jimmy Picket, MD 50 mL/hr at 08/22/18 0200    . 0.9 %  sodium chloride infusion  250 mL Intravenous PRN Olalere, Adewale A, MD      . chlorhexidine (PERIDEX) 0.12 % solution 15 mL  15 mL Mouth Rinse BID Olalere, Adewale A, MD   15 mL at 08/21/18 2300  . famotidine (PEPCID) tablet 20 mg  20 mg Oral Daily Arrien, Jimmy Picket, MD   20 mg at 08/22/18 0950  . insulin aspart (novoLOG) injection 0-9 Units  0-9 Units Subcutaneous TID WC Arrien, Jimmy Picket, MD   1 Units at 08/22/18 [phone]  . MEDLINE mouth rinse  15 mL Mouth Rinse q12n4p Olalere, Adewale A, MD   15 mL at 08/21/18 1150  . pravastatin (PRAVACHOL) tablet 40 mg  40 mg Oral q1800 Arrien, Jimmy Picket, MD   40 mg at 08/21/18 1755  . vancomycin (VANCOCIN) 50 mg/mL oral solution 125 mg  125 mg Oral QID Olalere, Adewale A, MD   125 mg at 08/22/18 8003     Discharge Medications: Please see discharge summary for a list of discharge medications.  Relevant Imaging Results:  Relevant Lab Results:   Additional Information SSN: [id number]  Pricilla Holm, LCSW

## 2018-08-22 NOTE — Discharge Summary (Addendum)
Physician Discharge Summary  [name] QAS:[id number] DOB: November 02, 1941 DOA: 08/19/2018  PCP: [place] Bing, DO  Admit date: 08/19/2018 Discharge date: 08/24/2018 Admitted From:SNF Disposition: SNF  Recommendations for Outpatient Follow-up:  1. Follow up with PCP in 1-2 weeks 2. Please obtain BMP/CBC in one week  Home HealthNONE Equipment/Devices:NONE Discharge Condition: STABLE CODE STATUS:FULL Diet recommendation: CARDIAC Brief/Interim Summary:77 yo male who was admitted due to melena and hypotension.  He does have the significant past medical history for chronic osteomyelitis, systolic heart failure, type 2 diabetes mellitus, GERD, hiatal hernia and hypertension.  He was currently treated for C. difficile colitis, and noticed at home melanotic stools.  Recent hospitalization for sepsis (urine/ pneumonia/ soft tissue), cardiac arrest August, 2, 2019.  On his initial physical examination he was hypotensive 80/50, heart rate 83, temperature 96.8, respiratory rate 18, oxygen saturation 100%, moist mucous membranes, lungs clear to auscultation bilaterally, decreased air movement, heart S1-S2 present rhythmic, no gallops, rubs or murmurs, the abdomen was soft nontender, positive bowel sounds, no lower extremity edema.  Sodium 143, potassium 3.9, chloride 96, bicarb 34, glucose 205, BUN 109, creatinine 2.15, white count 12.6, hemoglobin 6.7, hematocrit 21.3, platelets 311, urine analysis specific gravity 1.014, 0-5 white cells.  Chest x ray with left pleural effusion with associated atelectasis.  EKG sinus tachycardia, left axis deviation, precordial V2 to V6 new T wave inversions.   Patient was admitted to the intensive care unit with a working diagnosis of hypovolemic shock due to acute blood loss anemia complicated by acute kidney injury.  Discharge Diagnoses:  Active Problems:   Acute GI bleeding   Pressure injury of skin  1. Hypovolemic shock due to GI fluid losses and acute blood  loss anemia due to lower GI bleed. Patient has remained hemodynamically stable this amHb this am 8.0 with Hct 25,6 after total of 3 units PRBC transfusion. No signs of further bleeding today. Will continue to hold on aspirin. Blood pressure 111 to 121 mmHg.CONTINUE FAMOTIDINE 40 MG DAILY.IF ASPIRIN HAS TO BE STARTED AT A LATER TIME DC FAMOTIDINE AND START PPI.MAY RESTART ASPIRIN IN  2 TO 4 WEEKS IF CLINICALLY NECESSARY.  Losartan, metoprolol, torsemide has been on hold during this hospital stay due to soft blood pressure.  2. C  Diff colitis/ diarrhea (present on admission). Continue enteral vancomycin, will advance diet as tolerated, no nausea or vomiting, improved appetite and abdominal pain. Change pantoprazole IV to po famotidine.   3. Pre renal - AKI with hyperNatremia and hyperchloremia-ENCOURAGE PO FLUIDS.VERY HIGH RISKTO GET DEHYDRATED.CREATININE 0.89 ON DC.   4. Anemia of chronic disease with iron deficiency anemia.  Iron level low at 29 we will start him on iron sulfate.  5. Systolic heart failure/ chronic, EF 30 to 35%/ ischemic cardiomyopathy (not with exacerbation). Will continue to hold on losartan, metoprolol and torsemide. EKG with new precordial T wave inversions, will repeat ekg today. No chest pain. No signs of volume overload. Systolic blood pressure 798 to 121.   6. Chronic venous insufficiency with pressure decubitus ulcers (present on admission). Multiple stage 2 ulcers, present on admission, unna boots in place, continue local wound care at the skilled nursing facility.  7. Dyslipidemia. Resume pravastatin.   8. T2DM-blood sugars have been stable here without any insulin.  Discharge Instructions  Discharge Instructions    Call MD for:  persistant dizziness or light-headedness   Complete by:  As directed    Call MD for:  persistant nausea and vomiting  Complete by:  As directed    Call MD for:  redness, tenderness, or signs of infection (pain, swelling, redness,  odor or green/yellow discharge around incision site)   Complete by:  As directed    Diet - low sodium heart healthy   Complete by:  As directed    Increase activity slowly   Complete by:  As directed      Allergies as of 08/22/2018      Reactions   Ace Inhibitors Cough   With Lisinopril.   Angiotensin Receptor Blockers Other (See Comments)   Caused excessive weight gain   Metformin Diarrhea      Medication List    STOP taking these medications   aspirin 81 MG chewable tablet   Gerhardt's butt cream Crea   losartan 25 MG tablet Commonly known as:  COZAAR   metoprolol tartrate 25 MG tablet Commonly known as:  LOPRESSOR   mometasone 50 MCG/ACT nasal spray Commonly known as:  NASONEX   torsemide 20 MG tablet Commonly known as:  DEMADEX     TAKE these medications   DERMACLOUD Crea Apply 1 application topically 3 (three) times daily.   famotidine 20 MG tablet Commonly known as:  PEPCID Take 2 tablets (40 mg total) by mouth daily. Start taking on:  08/23/2018   ferrous sulfate 325 (65 FE) MG tablet Take 1 tablet (325 mg total) by mouth 2 (two) times daily with a meal.   FLONASE 50 MCG/ACT nasal spray Generic drug:  fluticasone Place 2 sprays into both nostrils daily.   FLOVENT HFA 220 MCG/ACT inhaler Generic drug:  fluticasone INHALE 1 PUFF INTO THE LUNGS TWICE DAILY What changed:  See the new instructions.   latanoprost 0.005 % ophthalmic solution Commonly known as:  XALATAN INSTILL 1 DROP IN BOTH EYES EVERY EVENING FOR GLAUCOMA What changed:  See the new instructions.   nystatin powder Commonly known as:  MYCOSTATIN/NYSTOP Apply topically 2 (two) times daily.   pravastatin 40 MG tablet Commonly known as:  PRAVACHOL TAKE 1 TABLET BY MOUTH DAILY   PROAIR HFA 108 (90 Base) MCG/ACT inhaler Generic drug:  albuterol INHALE 2 PUFFS BY MOUTH EVERY 4 HOURS AS NEEDED FOR WHEEZING OR SHORTNESS OF BREATH What changed:  See the new instructions.   PROBIOTIC  PO Take 2 capsules by mouth daily.   SANTYL ointment Generic drug:  collagenase Apply 1 application topically daily.   tamsulosin 0.4 MG Caps capsule Commonly known as:  FLOMAX TAKE 2 CAPSULES BY MOUTH EVERY MORNING What changed:  when to take this   traMADol 50 MG tablet Commonly known as:  ULTRAM Take 1 tablet (50 mg total) by mouth every 6 (six) hours as needed for moderate pain or severe pain.   urea 10 % lotion Apply topically 2 (two) times daily.   vancomycin 50 mg/mL  oral solution Commonly known as:  VANCOCIN Take 2.5 mLs (125 mg total) by mouth 4 (four) times daily.       Allergies  Allergen Reactions  . Ace Inhibitors Cough    With Lisinopril.  . Angiotensin Receptor Blockers Other (See Comments)    Caused excessive weight gain  . Metformin Diarrhea    Consultations: DR Cristina Gong  Procedures/Studies: Ct Abdomen Pelvis Wo Contrast  Result Date: 07/29/2018 CLINICAL DATA:  Right lower and upper quadrant abdominal pain. Appendicitis suspected. EXAM: CT ABDOMEN AND PELVIS WITHOUT CONTRAST TECHNIQUE: Multidetector CT imaging of the abdomen and pelvis was performed following the standard protocol without IV contrast.  COMPARISON:  CT abdomen and pelvis 05/19/2018. FINDINGS: Lower chest: Right lower lobe airspace disease is noted. This likely reflects atelectasis. Heart is mildly enlarged. No pleural or pericardial effusion is present. The left lung base is incompletely visualized. Left lower lobe airspace disease is present. Hepatobiliary: Liver is unremarkable. Multiple radiopaque gallstones are present. No significant inflammatory change low gallbladder. The common bile duct is within normal limits. Pancreas: Unremarkable. No pancreatic ductal dilatation or surrounding inflammatory changes. Spleen: Normal in size without focal abnormality. Adrenals/Urinary Tract: Adrenal glands are within normal limits bilaterally. Kidneys and ureters are unremarkable. There is no stone or  mass lesion. No hydronephrosis is present. Urinary bladder is partially obscured by metal artifact. There appears to be gas within the urinary bladder. Question instrumentation. Stomach/Bowel: Stomach and duodenum are within normal limits. Small bowel is unremarkable. Terminal ileum is within normal limits. The appendix is visualized and normal. Ascending and transverse colon are within normal limits. Descending and sigmoid colon are normal. Vascular/Lymphatic: Atherosclerotic calcifications are present in the aorta and branch vessels without aneurysm. Reproductive: Unremarkable. Other: Minimal fluid is present about the free edge of the liver. No other significant free fluid is present. There is no significant ventral hernia. Musculoskeletal: Vacuum disc is present at L4-5. Vertebral body heights are maintained. Pelvis is partially obscured by artifact from bilateral total hip arthroplasties. IMPRESSION: 1. Gas within the urinary bladder. Question infection or recent instrumentation. 2. Normal appearance of the appendix. 3. Cholelithiasis without evidence for cholecystitis. 4. Bibasilar airspace disease and effusions. While this may represent atelectasis, infection is not excluded. Electronically Signed   By: San Morelle M.D.   On: 07/29/2018 18:42   Ct Angio Chest Pe W Or Wo Contrast  Result Date: 07/31/2018 CLINICAL DATA:  Cardiac arrest EXAM: CT ANGIOGRAPHY CHEST WITH CONTRAST TECHNIQUE: Multidetector CT imaging of the chest was performed using the standard protocol during bolus administration of intravenous contrast. Multiplanar CT image reconstructions and MIPs were obtained to evaluate the vascular anatomy. CONTRAST:  178mL ISOVUE-370 IOPAMIDOL (ISOVUE-370) INJECTION 76% COMPARISON:  Chest 07/31/2018, CT chest 05/19/2018 FINDINGS: Cardiovascular: Negative for pulmonary embolism. Heart size upper normal. Mild coronary calcification. Atherosclerotic thoracic aorta. Aorta not adequately opacified to  evaluate for dissection. Negative for pericardial effusion Mediastinum/Nodes: Negative for mass or adenopathy. The patient is intubated. NG tip distal esophagus not entering the stomach. Lungs/Pleura: Small bilateral effusions with bibasilar atelectasis left greater than right. Upper lobes clear bilaterally. Upper Abdomen: No acute abnormality Musculoskeletal: Negative Review of the MIP images confirms the above findings. IMPRESSION: Negative for pulmonary embolism Mild coronary calcification Bilateral pleural effusions and bibasilar atelectasis NG tip in the distal esophagus.  Endotracheal tube in good position. Aortic Atherosclerosis (ICD10-I70.0). Electronically Signed   By: Franchot Gallo M.D.   On: 07/31/2018 14:37   Nm Pulmonary Perf And Vent  Result Date: 07/29/2018 CLINICAL DATA:  Weakness, lethargy, elevated D-dimer EXAM: NUCLEAR MEDICINE VENTILATION - PERFUSION LUNG SCAN TECHNIQUE: Ventilation images were obtained in multiple projections using inhaled aerosol Tc-77m DTPA. Perfusion images were obtained in multiple projections after intravenous injection of Tc-5m-MAA. RADIOPHARMACEUTICALS:  4.2 mCi of Tc-53m DTPA aerosol inhalation and 31.2 mCi Tc53m-MAA IV COMPARISON:  None. FINDINGS: Ventilation: Mildly heterogeneous ventilation without suspicious ventilation defect. Perfusion: No wedge shaped peripheral perfusion defects to suggest acute pulmonary embolism. Recent chest radiograph demonstrates bibasilar atelectasis and possible small left pleural effusion. IMPRESSION: Negative for pulmonary embolism. Electronically Signed   By: Julian Hy M.D.   On: 07/29/2018 19:29  Dg Chest Portable 1 View  Result Date: 08/19/2018 CLINICAL DATA:  Hypotension.  Rectal bleeding. EXAM: PORTABLE CHEST 1 VIEW COMPARISON:  08/03/2018 and 05/19/2018 FINDINGS: Heart size and pulmonary vascularity are normal. The lungs are clear. Slight chronic elevation of the left hemidiaphragm with slight chronic atelectasis  at the left lung base. No acute bone abnormality. Severe left glenohumeral joint arthritis. Findings consistent with chronic complete rotator cuff tears bilaterally. IMPRESSION: No acute abnormalities. Chronic elevation of the left hemidiaphragm with slight chronic atelectasis at the left lung base. Electronically Signed   By: Lorriane Shire M.D.   On: 08/19/2018 16:10   Dg Chest Port 1 View  Result Date: 08/03/2018 CLINICAL DATA:  Hypoxia EXAM: PORTABLE CHEST 1 VIEW COMPARISON:  August 02, 2018 FINDINGS: There is airspace consolidation in both lower lobes, somewhat more on the left than on the right with pleural effusions bilaterally, larger on the left than on the right. Heart is borderline enlarged with pulmonary vascularity normal. No adenopathy evident. There is arthropathy in the shoulders with superior migration of the left humeral head and remodeling of the left humeral head. IMPRESSION: Consolidation in both lung bases, likely enlarged part due to atelectasis, although superimposed pneumonia in the bases must be of concern. There are pleural effusions in the bases, larger on the left than on the right. There is stable cardiomegaly. 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 Electronically Signed   By: Lowella Grip III M.D.   On: 08/03/2018 07:09   Dg Chest Port 1 View  Result Date: 08/02/2018 CLINICAL DATA:  Respiratory failure. EXAM: PORTABLE CHEST 1 VIEW COMPARISON:  07/31/2018 FINDINGS: Endotracheal and enteric tubes have been removed. The cardiac silhouette is mildly enlarged. There is persistent pulmonary vascular congestion with increasing perihilar and basilar opacities bilaterally. Small bilateral pleural effusions persist. No pneumothorax is identified. IMPRESSION: 1. Interval extubation. 2. Small bilateral pleural effusions with increasing perihilar and basilar  opacities likely reflecting worsening edema and atelectasis. Electronically Signed   By: Logan Bores M.D.   On: 08/02/2018 07:18   Dg Chest Port 1 View  Result Date: 07/31/2018 CLINICAL DATA:  Intubation. EXAM: PORTABLE CHEST 1 VIEW COMPARISON:  07/28/2018. FINDINGS: Endotracheal tube noted with tip 5.1 cm above the carina. NG tube noted, its distal tip is difficult to discern due to overlying leads and pads. The distal tip may be in the distal esophagus, repeat image can be obtained. Cardiomegaly with pulmonary venous congestion. Low lung volumes with mild bibasilar atelectasis. No prominent pleural effusion. No pneumothorax. IMPRESSION: 1. Endotracheal tube noted with tip 5.1 cm above the carina. NG tube noted, its tip is difficult to discern due to overlying leads and pads. Distal NG tube tip may be in the distal esophagus, repeat image can be obtained. 2. Cardiomegaly with pulmonary venous congestion. Low lung volumes with mild bibasilar atelectasis. Electronically Signed   By: Marcello Moores  Register   On: 07/31/2018 12:11   Dg Chest Port 1 View  Result Date: 07/28/2018 CLINICAL DATA:  Sepsis EXAM: PORTABLE CHEST 1 VIEW COMPARISON:  CT chest 06/04/2018, radiograph 06/04/2018, 05/19/2018 FINDINGS: Small left pleural effusion with lingular and left base opacity. Cardiomegaly with mild vascular congestion. No pneumothorax. IMPRESSION: 1. Cardiomegaly with vascular congestion 2. Probable small left effusion. Airspace disease at the lingula and left base may reflect atelectasis or pneumonia. Electronically Signed   By: Madie Reno.D.  On: 07/28/2018 16:37   Dg Abd Portable 1v  Result Date: 07/31/2018 CLINICAL DATA:  Orogastric tube placement. EXAM: PORTABLE ABDOMEN - 1 VIEW COMPARISON:  CT scan of July 29, 2018. FINDINGS: Moderate gastric distention is noted. No large or small bowel dilatation is noted. Distal tip of orogastric tube is not well visualized as it may be at top of field-of-view. IMPRESSION:  Moderate gastric distention. Distal tip of orogastric tube is not well visualized, but may be present at top of field of view. Repeat radiograph performed at higher level is recommended. Electronically Signed   By: Marijo Conception, M.D.   On: 07/31/2018 12:14    (Echo, Carotid, EGD, Colonoscopy, ERCP)    Subjective:   Discharge Exam: Vitals:   08/21/18 2109 08/22/18 0525  BP: (!) 104/57 109/62  Pulse: 91 99  Resp: 20 20  Temp: 98.1 F (36.7 C) 97.7 F (36.5 C)  SpO2: 93% 100%   Vitals:   08/21/18 1226 08/21/18 1227 08/21/18 2109 08/22/18 0525  BP: 137/82 137/82 (!) 104/57 109/62  Pulse: 90 90 91 99  Resp: (!) 24  20 20   Temp: 98.2 F (36.8 C) 98.2 F (36.8 C) 98.1 F (36.7 C) 97.7 F (36.5 C)  TempSrc: Oral Oral Oral Oral  SpO2: 92% 92% 93% 100%  Weight:    (!) 151 kg  Height:        General: Pt is alert, awake, not in acute distress Cardiovascular: RRR, S1/S2 +, no rubs, no gallops Respiratory: CTA bilaterally, no wheezing, no rhonchi Abdominal: Soft, NT, ND, bowel sounds + Extremities: no edema, no cyanosis    The results of significant diagnostics from this hospitalization (including imaging, microbiology, ancillary and laboratory) are listed below for reference.     Microbiology: Recent Results (from the past 240 hour(s))  Urine culture     Status: Abnormal   Collection Time: 08/19/18  3:38 PM  Result Value Ref Range Status   Specimen Description   Final    URINE, RANDOM Performed at [facility] [address], [place], [place] [address]    Special Requests   Final    NONE Performed at [facility], [place] [address] [place], [place] [address]    Culture MULTIPLE SPECIES PRESENT, SUGGEST RECOLLECTION (A)  Final   Report Status 08/21/2018 FINAL  Final  C difficile quick scan w PCR reflex     Status: Abnormal   Collection Time: 08/19/18  3:55 PM  Result Value Ref Range Status   C Diff antigen POSITIVE (A)  NEGATIVE Final   C Diff toxin NEGATIVE NEGATIVE Final   C Diff interpretation Results are indeterminate. See PCR results.  Final    Comment: Performed at [facility], [place] [address] [place], [place] [address]  C. Diff by PCR, Reflexed     Status: Abnormal   Collection Time: 08/19/18  3:55 PM  Result Value Ref Range Status   Toxigenic C. Difficile by PCR POSITIVE (A) NEGATIVE Final    Comment: Positive for toxigenic C. difficile with little to no toxin production. Only treat if clinical presentation suggests symptomatic illness. Performed at [facility], [place] [address], [place], [place] [address]   MRSA PCR Screening     Status: None   Collection Time: 08/19/18  7:08 PM  Result Value Ref Range Status   MRSA by PCR NEGATIVE NEGATIVE Final    Comment:        The GeneXpert MRSA  Assay (FDA approved for NASAL specimens only), is one component of a comprehensive MRSA colonization surveillance program. It is not intended to diagnose MRSA infection nor to guide or monitor treatment for MRSA infections. Performed at [facility], [place] [address], [place], [place] [address]   Blood culture (routine x 2)     Status: None (Preliminary result)   Collection Time: 08/19/18  7:53 PM  Result Value Ref Range Status   Specimen Description   Final    BLOOD RIGHT ANTECUBITAL Performed at [facility] [address], [place], [place] [address]    Special Requests   Final    BOTTLES DRAWN AEROBIC ONLY Blood Culture adequate volume Performed at [facility] [address], [place], [place] [address]    Culture   Final    NO GROWTH 3 DAYS Performed at [facility], [place] [address], [place], [place] [address]    Report Status PENDING  Incomplete  Blood culture (routine x 2)     Status: None (Preliminary result)   Collection Time: 08/19/18  7:54 PM  Result Value Ref Range Status   Specimen Description   Final     BLOOD RIGHT FOREARM Performed at [facility] [address], [place], [place] [address]    Special Requests   Final    BOTTLES DRAWN AEROBIC AND ANAEROBIC Blood Culture adequate volume Performed at [facility] [address], [place], [place] [address]    Culture   Final    NO GROWTH 3 DAYS Performed at [facility], [place] [address], [place], [place] [address]    Report Status PENDING  Incomplete     Labs: BNP (last 3 results) Recent Labs    05/19/18 0152 06/04/18 0305 07/28/18 1713  BNP 43.8 29.8 96.2   Basic Metabolic Panel: Recent Labs  Lab 08/19/18 1451 08/19/18 1527 08/20/18 0335 08/22/18 0541  NA 143 141 146* 148*  K 3.9 3.7 3.7 4.0  CL 96* 91* 104 112*  CO2 34*  --  33* 31  GLUCOSE 205* 202* 158* 145*  BUN 109* 111* 100* 39*  CREATININE 2.15* 2.10* 1.83* 0.89  CALCIUM 8.1*  --  8.0* 8.6*   Liver Function Tests: Recent Labs  Lab 08/19/18 1451  AST 9*  ALT 8  ALKPHOS 65  BILITOT 0.3  PROT 6.4*  ALBUMIN 2.1*   Recent Labs  Lab 08/19/18 1517  LIPASE 21   No results for input(s): AMMONIA in the last 168 hours. CBC: Recent Labs  Lab 08/19/18 1517 08/19/18 1527 08/19/18 2052 08/20/18 0335 08/21/18 0734 08/22/18 0541  WBC 12.6*  --  14.6* 13.2*  --  10.3  NEUTROABS 10.3*  --   --   --   --  7.1  HGB 6.7* 8.5* 8.0* 8.0* 8.2* 8.0*  HCT 21.3* 25.0* 25.1* 25.1* 25.6* 25.5*  MCV 83.9  --  83.7 84.2  --  89.8  PLT 311  --  136* 227  --  230   Cardiac Enzymes: No results for input(s): CKTOTAL, CKMB, CKMBINDEX, TROPONINI in the last 168 hours. BNP: Invalid input(s): POCBNP CBG: Recent Labs  Lab 08/21/18 1333 08/21/18 1720 08/21/18 2104 08/22/18 0807 08/22/18 1201  GLUCAP 149* 139* 198* 125* 119*   D-Dimer No results for input(s): DDIMER in the last 72 hours. Hgb A1c No results for input(s): HGBA1C in the last 72 hours. Lipid Profile No results for input(s): CHOL, HDL, LDLCALC,  TRIG,  CHOLHDL, LDLDIRECT in the last 72 hours. Thyroid function studies No results for input(s): TSH, T4TOTAL, T3FREE, THYROIDAB in the last 72 hours.  Invalid input(s): FREET3 Anemia work up Recent Labs    08/22/18 0541  FERRITIN 176  TIBC 177*  IRON 29*   Urinalysis    Component Value Date/Time   COLORURINE YELLOW 08/19/2018 [place] 08/19/2018 1538   LABSPEC 1.014 08/19/2018 1538   PHURINE 5.0 08/19/2018 1538   GLUCOSEU NEGATIVE 08/19/2018 1538   HGBUR SMALL (A) 08/19/2018 1538   HGBUR negative 06/08/2007 1320   BILIRUBINUR NEGATIVE 08/19/2018 1538   BILIRUBINUR NEG 02/25/2012 1351   KETONESUR NEGATIVE 08/19/2018 1538   PROTEINUR NEGATIVE 08/19/2018 1538   UROBILINOGEN 1.0 11/10/2012 2115   NITRITE NEGATIVE 08/19/2018 1538   LEUKOCYTESUR NEGATIVE 08/19/2018 1538   Sepsis Labs Invalid input(s): PROCALCITONIN,  WBC,  LACTICIDVEN Microbiology Recent Results (from the past 240 hour(s))  Urine culture     Status: Abnormal   Collection Time: 08/19/18  3:38 PM  Result Value Ref Range Status   Specimen Description   Final    URINE, RANDOM Performed at [facility], [place] [address] [place], [place] [address]    Special Requests   Final    NONE Performed at [facility], [place] [address] [place], [place] [address]    Culture MULTIPLE SPECIES PRESENT, SUGGEST RECOLLECTION (A)  Final   Report Status 08/21/2018 FINAL  Final  C difficile quick scan w PCR reflex     Status: Abnormal   Collection Time: 08/19/18  3:55 PM  Result Value Ref Range Status   C Diff antigen POSITIVE (A) NEGATIVE Final   C Diff toxin NEGATIVE NEGATIVE Final   C Diff interpretation Results are indeterminate. See PCR results.  Final    Comment: Performed at [facility], [place] [address], [place], [place] [address]  C. Diff by PCR, Reflexed     Status: Abnormal   Collection Time: 08/19/18  3:55 PM  Result Value Ref Range Status    Toxigenic C. Difficile by PCR POSITIVE (A) NEGATIVE Final    Comment: Positive for toxigenic C. difficile with little to no toxin production. Only treat if clinical presentation suggests symptomatic illness. Performed at [facility], [place] [address], [place], [place] [address]   MRSA PCR Screening     Status: None   Collection Time: 08/19/18  7:08 PM  Result Value Ref Range Status   MRSA by PCR NEGATIVE NEGATIVE Final    Comment:        The GeneXpert MRSA Assay (FDA approved for NASAL specimens only), is one component of a comprehensive MRSA colonization surveillance program. It is not intended to diagnose MRSA infection nor to guide or monitor treatment for MRSA infections. Performed at [facility], [place] [address] [place], [place] [address]   Blood culture (routine x 2)     Status: None (Preliminary result)   Collection Time: 08/19/18  7:53 PM  Result Value Ref Range Status   Specimen Description   Final    BLOOD RIGHT ANTECUBITAL Performed at [facility] [address] [place], [place] [address]    Special Requests   Final    BOTTLES DRAWN AEROBIC ONLY Blood Culture adequate volume Performed at [facility] [address], [place],  [address]    Culture   Final    NO GROWTH 3 DAYS Performed at  [facility], [place] [address] [place], [place] [address]    Report Status PENDING  Incomplete  Blood culture (routine x 2)     Status: None (Preliminary result)   Collection Time: 08/19/18  7:54 PM  Result Value Ref Range Status   Specimen Description   Final    BLOOD RIGHT FOREARM Performed at [facility] [address], [place], [place] [address]    Special Requests   Final    BOTTLES DRAWN AEROBIC AND ANAEROBIC Blood Culture adequate volume Performed at [facility] [address] [place], [place] [address]    Culture   Final    NO GROWTH 3  DAYS Performed at [facility], [place] [address], [place], [place] [address]    Report Status PENDING  Incomplete     Time coordinating discharge:45 minutes  SIGNED:   Georgette Shell, MD  [facility] 08/22/2018, 1:26 PM Pager   If 7PM-7AM, please contact night-coverage [email or web address] Password [email or web address]
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
## 2018-08-23 LAB — GLUCOSE, CAPILLARY
GLUCOSE-CAPILLARY: 130 mg/dL — AB (ref 70–99)
GLUCOSE-CAPILLARY: 150 mg/dL — AB (ref 70–99)
GLUCOSE-CAPILLARY: 111 mg/dL — AB (ref 70–99)
GLUCOSE-CAPILLARY: 184 mg/dL — AB (ref 70–99)

## 2018-08-23 NOTE — Care Management Important Message (Signed)
Important Message  Patient Details  Name: [name] MRN: [id number] Date of Birth: 06/26/1941   Medicare Important Message Given:  Yes    Erenest Rasher, RN 08/23/2018, 11:12 AM

## 2018-08-24 DIAGNOSIS — E440 Moderate protein-calorie malnutrition: Secondary | ICD-10-CM | POA: Diagnosis present

## 2018-08-24 LAB — CULTURE, BLOOD (ROUTINE X 2)
Culture: NO GROWTH
Culture: NO GROWTH
SPECIAL REQUESTS: ADEQUATE
Special Requests: ADEQUATE

## 2018-08-24 LAB — GLUCOSE, CAPILLARY
GLUCOSE-CAPILLARY: 160 mg/dL — AB (ref 70–99)
GLUCOSE-CAPILLARY: 170 mg/dL — AB (ref 70–99)
GLUCOSE-CAPILLARY: 138 mg/dL — AB (ref 70–99)
Glucose-Capillary: 136 mg/dL — ABNORMAL HIGH (ref 70–99)

## 2018-08-24 NOTE — Progress Notes (Signed)
CSW followed up with [employer] to inquire about status of patient's insurance authorization, staff member Manuela Schwartz reported that [employer] authorization is under review by the Market researcher and they anticipate a decision by the end of the day. Patient needs [employer] insurance authorization to dc back to [facility] SNF to continue [place] rehab. CSW will continue to follow and assist with discharge planning.  Abundio Miu, [place] Social Worker [facility] Cell#: [phone]

## 2018-08-24 NOTE — Progress Notes (Signed)
Medically stable for d/c-d/c order 8/24-CSW working diligently on SNF.

## 2018-08-24 NOTE — Progress Notes (Signed)
CSW following to assist with discharge planning to SNF for ST rehab. CSW contacted [employer] to inquire about patient's insurance authorization. Staff member Marlowe Kays reported that [employer] authorization is currently pending. CSW awaiting return call regarding patient's insurance authorization for SNF.  Abundio Miu, [place] Social Worker [facility] Cell#: [phone]

## 2018-08-25 ENCOUNTER — Inpatient Hospital Stay (HOSPITAL_COMMUNITY): Payer: PPO

## 2018-08-25 DIAGNOSIS — R0682 Tachypnea, not elsewhere classified: Secondary | ICD-10-CM

## 2018-08-25 DIAGNOSIS — Z515 Encounter for palliative care: Secondary | ICD-10-CM

## 2018-08-25 LAB — COMPREHENSIVE METABOLIC PANEL
ALBUMIN: 2.2 g/dL — AB (ref 3.5–5.0)
ALT: 9 U/L (ref 0–44)
AST: 10 U/L — AB (ref 15–41)
Alkaline Phosphatase: 71 U/L (ref 38–126)
Anion gap: 6 (ref 5–15)
BILIRUBIN TOTAL: 0.4 mg/dL (ref 0.3–1.2)
BUN: 24 mg/dL — AB (ref 8–23)
CHLORIDE: 108 mmol/L (ref 98–111)
CO2: 31 mmol/L (ref 22–32)
Calcium: 8.8 mg/dL — ABNORMAL LOW (ref 8.9–10.3)
Creatinine, Ser: 0.66 mg/dL (ref 0.61–1.24)
GFR calc Af Amer: >60 mL/min (ref >60)
GFR calc non Af Amer: >60 mL/min (ref >60)
GLUCOSE: 156 mg/dL — AB (ref 70–99)
POTASSIUM: 4.6 mmol/L (ref 3.5–5.1)
Sodium: 145 mmol/L (ref 135–145)
Total Protein: 5.9 g/dL — ABNORMAL LOW (ref 6.5–8.1)

## 2018-08-25 LAB — CBC
HEMATOCRIT: 23.1 % — AB (ref 39.0–52.0)
Hemoglobin: 7 g/dL — ABNORMAL LOW (ref 13.0–17.0)
MCH: 27.7 pg (ref 26.0–34.0)
MCHC: 30.3 g/dL (ref 30.0–36.0)
MCV: 91.3 fL (ref 78.0–100.0)
Platelets: 235 10*3/uL (ref 150–400)
RBC: 2.53 MIL/uL — AB (ref 4.22–5.81)
RDW: 18.5 % — ABNORMAL HIGH (ref 11.5–15.5)
WBC: 11.6 10*3/uL — AB (ref 4.0–10.5)

## 2018-08-25 LAB — TROPONIN I
Troponin I: <0.03 ng/mL (ref <0.03)
Troponin I: <0.03 ng/mL (ref <0.03)

## 2018-08-25 LAB — PREPARE RBC (CROSSMATCH)

## 2018-08-25 LAB — OCCULT BLOOD X 1 CARD TO LAB, STOOL: FECAL OCCULT BLD: POSITIVE — AB

## 2018-08-25 LAB — GLUCOSE, CAPILLARY
GLUCOSE-CAPILLARY: 172 mg/dL — AB (ref 70–99)
Glucose-Capillary: 145 mg/dL — ABNORMAL HIGH (ref 70–99)
Glucose-Capillary: 129 mg/dL — ABNORMAL HIGH (ref 70–99)

## 2018-08-25 LAB — BRAIN NATRIURETIC PEPTIDE: B Natriuretic Peptide: 124.2 pg/mL — ABNORMAL HIGH (ref 0.0–100.0)

## 2018-08-25 MED ORDER — FUROSEMIDE 10 MG/ML IJ SOLN
20.0000 mg | Freq: Once | INTRAMUSCULAR | Status: AC
  Administered 2018-08-25: 20 mg via INTRAVENOUS
  Filled 2018-08-25: qty 2

## 2018-08-25 MED ORDER — SODIUM CHLORIDE 0.9% IV SOLUTION
Freq: Once | INTRAVENOUS | Status: AC
  Administered 2018-08-25: 12:00:00 via INTRAVENOUS

## 2018-08-25 NOTE — Progress Notes (Signed)
CSW contacted by [facility] [employer] staff member Marlowe Kays [phone]) and informed that patient's insurance authorization for SNF was denied by medical director. Staff reported that patient's attending MD has the option to do a peer to peer with Market researcher.  CSW updated patient's attending MD via text page.   CSW will continue to follow and assist with discharge planning.  Abundio Miu, [place] Social Worker [facility] Cell#: [phone]

## 2018-08-25 NOTE — Progress Notes (Signed)
Una boots changed today.   Damani Kelemen, [place] 08/25/2018

## 2018-08-25 NOTE — Progress Notes (Addendum)
PROGRESS NOTE    [name]  DXA:[id number] DOB: 1941-11-25 DOA: 08/19/2018 PCP: [place] Bing, DO Brief Narrative:77 yo male who was admitted due to melena and hypotension.He does have the significantpast medical history for chronic osteomyelitis,systolic heart failure,type 2 diabetes mellitus, GERD, hiatal herniaandhypertension.He was currently treated for C. difficile colitis, andnoticed at home melanotic stools. Recent hospitalization for sepsis (urine/ pneumonia/ soft tissue), cardiac arrest August, 2,2019.On hisinitial physical examination he was hypotensive 80/50, heart rate 83, temperature 96.8, respiratory rate 18, oxygen saturation 100%, moist mucous membranes, lungs clear to auscultation bilaterally, decreased air movement, heart S1-S2 present rhythmic, no gallops, rubs or murmurs, the abdomen was soft nontender, positive bowel sounds, no lower extremity edema. Sodium 143, potassium 3.9, chloride 96, bicarb 34, glucose 205, BUN 109, creatinine 2.15, white count 12.6, hemoglobin 6.7, hematocrit 21.3, platelets 311,urine analysis specific gravity 1.014, 0-5 white cells. Chest xray with left pleural effusion with associated atelectasis. EKG sinus tachycardia, left axis deviation, precordial V2 to V6 newT wave inversions.  Patient was admitted to the intensive care unit with a working diagnosis of hypovolemic shock due to acute blood loss anemia complicated by acute kidney injury.  08/25/2018 patient was scheduled to be discharged to nursing home today pending authorization.  But he became very short of breath tachypneic.  He complained of dyspnea on exertion shortness of breath.  Denied any cough chest pain fever or chills.  he has a history of systolic heart failure with decreased ejection fraction.  However his saturation was  above 95% on 1 L of oxygen.  He denied feeling anxious.  Assessment & Plan:   Active Problems:   Gastrointestinal hemorrhage   Pressure  injury of skin   Hemorrhagic shock (HCC)   Malnutrition of moderate degree  1] new onset shortness of breath and tachypnea-patient was using accessory muscles.  Stat chest x-ray showed stable left lung base density subsegmental atelectasis.  His BNP was only 124 he is also obese.  trop was negative.  His hemoglobin was 7.0 which probably contributed to his shortness of breath and tachypnea.  Will transfuse 1 unit to keep his hemoglobin above 8 to avoid cardiac ischemia.  Patient is at high risk of rapid clinical duration.  I have also consulted palliative care appreciate their input.  2]CDIFF  colitis on p.o. Vancomycin.  3] AKI resolved with fluids.  4] status post hypovolemic shock due to GI bleed patient was seen by GI who recommended a EGD but patient refused.  So aspirin was on hold.  They recommended famotidine 40 mg daily to be continued.  Aspirin is still on hold.  If at all aspirin need to be restarted in the future they recommended to start PPI and DC famotidine. he Is at very high risk for rapid clinical deterioration.  [id number] PM-RN reported patient had LARGE BLACK STOOL SEND FOR FOBT.PATIENT REFUSED EGD when he was first admitted.  5] history of systolic heart failure ejection fraction 30 to 35% I had given him a dose of Lasix today.  Monitor daily and pre-dose as needed.  6] chronic venous insufficiency with pressure decubitus ulcer present prior to admission continue wound outputs.   DVT prophylaxis:unna boots gi bleed Code Status full Family Communication:none Disposition Plan:await palliative in put  Consultants: gi signed off Palliative care pending  Procedures:none Antimicrobials:oral vancomycin  Subjective: Reports feeling more short of breath starting last night.  Denies chest pain cough fever.  Objective: Vitals:   08/25/18 0844 08/25/18 1130 08/25/18 1210  08/25/18 1537  BP:  (!) 144/60 116/70 (!) 117/57  Pulse:  98 94 90  Resp:  20 (!) 21 (!) 22    Temp:  98.1 F (36.7 C) 98.2 F (36.8 C) 98 F (36.7 C)  TempSrc:  Oral Oral Oral  SpO2: 97% 100% 100% 96%  Weight:      Height:        Intake/Output Summary (Last 24 hours) at 08/25/2018 1554 Last data filed at 08/25/2018 1537 Gross per 24 hour  Intake 340.5 ml  Output 1000 ml  Net -659.5 ml   Filed Weights   08/21/18 0500 08/22/18 0525 08/23/18 0504  Weight: 132.8 kg (!) 151 kg (!) 148.8 kg    Examination:  General exam: Appears calm and comfortable  Respiratory system: Clear to auscultation. Respiratory effort TACHYPNEIC . Cardiovascular system: S1 & S2 heard, RRR. No JVD, murmurs, rubs, gallops or clicks. No pedal edema. Gastrointestinal system: Abdomen is nondistended, soft and nontender. No organomegaly or masses felt. Normal bowel sounds heard. Central nervous system: Alert and oriented. No focal neurological deficits. Extremities: Symmetric 5 x 5 power. Skin: No rashes, lesions or ulcers Psychiatry: Judgement and insight appear normal. Mood & affect appropriate.     Data Reviewed: I have personally reviewed following labs and imaging studies  CBC: Recent Labs  Lab 08/19/18 1517  08/19/18 2052 08/20/18 0335 08/21/18 0734 08/22/18 0541 08/25/18 0914  WBC 12.6*  --  14.6* 13.2*  --  10.3 11.6*  NEUTROABS 10.3*  --   --   --   --  7.1  --   HGB 6.7*   < > 8.0* 8.0* 8.2* 8.0* 7.0*  HCT 21.3*   < > 25.1* 25.1* 25.6* 25.5* 23.1*  MCV 83.9  --  83.7 84.2  --  89.8 91.3  PLT 311  --  136* 227  --  230 235   < > = values in this interval not displayed.   Basic Metabolic Panel: Recent Labs  Lab 08/19/18 1451 08/19/18 1527 08/20/18 0335 08/22/18 0541 08/25/18 0914  NA 143 141 146* 148* 145  K 3.9 3.7 3.7 4.0 4.6  CL 96* 91* 104 112* 108  CO2 34*  --  33* 31 31  GLUCOSE 205* 202* 158* 145* 156*  BUN 109* 111* 100* 39* 24*  CREATININE 2.15* 2.10* 1.83* 0.89 0.66  CALCIUM 8.1*  --  8.0* 8.6* 8.8*   GFR: Estimated Creatinine Clearance: 114.2 mL/min (by  C-G formula based on SCr of 0.66 mg/dL). Liver Function Tests: Recent Labs  Lab 08/19/18 1451 08/25/18 0914  AST 9* 10*  ALT 8 9  ALKPHOS 65 71  BILITOT 0.3 0.4  PROT 6.4* 5.9*  ALBUMIN 2.1* 2.2*   Recent Labs  Lab 08/19/18 1517  LIPASE 21   No results for input(s): AMMONIA in the last 168 hours. Coagulation Profile: Recent Labs  Lab 08/19/18 1517  INR 1.18   Cardiac Enzymes: Recent Labs  Lab 08/25/18 0914 08/25/18 1458  TROPONINI <0.03 <0.03   BNP (last 3 results) No results for input(s): PROBNP in the last 8760 hours. HbA1C: No results for input(s): HGBA1C in the last 72 hours. CBG: Recent Labs  Lab 08/24/18 1204 08/24/18 1709 08/24/18 2104 08/25/18 0732 08/25/18 1130  GLUCAP 160* 170* 138* 172* 145*   Lipid Profile: No results for input(s): CHOL, HDL, LDLCALC, TRIG, CHOLHDL, LDLDIRECT in the last 72 hours. Thyroid Function Tests: No results for input(s): TSH, T4TOTAL, FREET4, T3FREE, THYROIDAB in the last 72  hours. Anemia Panel: No results for input(s): VITAMINB12, FOLATE, FERRITIN, TIBC, IRON, RETICCTPCT in the last 72 hours. Sepsis Labs: Recent Labs  Lab 08/19/18 1527  LATICACIDVEN 1.20    Recent Results (from the past 240 hour(s))  Urine culture     Status: Abnormal   Collection Time: 08/19/18  3:38 PM  Result Value Ref Range Status   Specimen Description   Final    URINE, RANDOM Performed at [facility] [address], [place], [place] [address]    Special Requests   Final    NONE Performed at [facility], [place] [address] [place], [place] [address]    Culture MULTIPLE SPECIES PRESENT, SUGGEST RECOLLECTION (A)  Final   Report Status 08/21/2018 FINAL  Final  C difficile quick scan w PCR reflex     Status: Abnormal   Collection Time: 08/19/18  3:55 PM  Result Value Ref Range Status   C Diff antigen POSITIVE (A) NEGATIVE Final   C Diff toxin NEGATIVE NEGATIVE Final   C Diff interpretation Results  are indeterminate. See PCR results.  Final    Comment: Performed at [facility], [place] [address], [place], [place] [address]  C. Diff by PCR, Reflexed     Status: Abnormal   Collection Time: 08/19/18  3:55 PM  Result Value Ref Range Status   Toxigenic C. Difficile by PCR POSITIVE (A) NEGATIVE Final    Comment: Positive for toxigenic C. difficile with little to no toxin production. Only treat if clinical presentation suggests symptomatic illness. Performed at [facility], [place] [address] [place], [place] [address]   MRSA PCR Screening     Status: None   Collection Time: 08/19/18  7:08 PM  Result Value Ref Range Status   MRSA by PCR NEGATIVE NEGATIVE Final    Comment:        The GeneXpert MRSA Assay (FDA approved for NASAL specimens only), is one component of a comprehensive MRSA colonization surveillance program. It is not intended to diagnose MRSA infection nor to guide or monitor treatment for MRSA infections. Performed at [facility], [place] [address], [place], [place] [address]   Blood culture (routine x 2)     Status: None   Collection Time: 08/19/18  7:53 PM  Result Value Ref Range Status   Specimen Description   Final    BLOOD RIGHT ANTECUBITAL Performed at [facility] [address], [place], [place] [address]    Special Requests   Final    BOTTLES DRAWN AEROBIC ONLY Blood Culture adequate volume Performed at [facility] [address] [place], [place] [address]    Culture   Final    NO GROWTH 5 DAYS Performed at [facility], [place] [address] [place], [place] [address]    Report Status 08/24/2018 FINAL  Final  Blood culture (routine x 2)     Status: None   Collection Time: 08/19/18  7:54 PM  Result Value Ref Range Status   Specimen Description   Final    BLOOD RIGHT FOREARM Performed at [facility] [address], [place], [place] [address]     Special Requests   Final    BOTTLES DRAWN AEROBIC AND ANAEROBIC Blood Culture adequate volume Performed at [facility] [address], [place],  [address]    Culture   Final    NO GROWTH 5 DAYS Performed at [facility], [place] [address] [place] [address], [place], [place] [address]    Report Status 08/24/2018 FINAL  Final         Radiology Studies: Dg Chest Port 1 View  Result Date: 08/25/2018 CLINICAL DATA:  Shortness of breath.  Hemorrhagic shock. EXAM: PORTABLE CHEST 1 VIEW COMPARISON:  Chest x-ray of August 19, 2018 FINDINGS: The lungs are reasonably well expanded. There is stable increased density at the left lung base with mild elevation of the left hemidiaphragm. Coarse bilateral perihilar densities are present and slightly more conspicuous. Lung the lung markings are coarse in the right infrahilar region and also more conspicuous. The heart is top-normal in size. The pulmonary vascularity is normal. There is calcification in the wall of the aortic arch. There is severe degenerative change of the left shoulder with resorption of the distal aspect of the clavicle. IMPRESSION: Slight increase conspicuity of the perihilar lung markings and right infrahilar lung markings suggests subsegmental atelectasis. Stable density at the left lung base with elevation of the left hemidiaphragm. Thoracic aortic atherosclerosis. Electronically Signed   By: David  Martinique M.D.   On: 08/25/2018 09:26        Scheduled Meds: . chlorhexidine  15 mL Mouth Rinse BID  . famotidine  20 mg Oral Daily  . insulin aspart  0-9 Units Subcutaneous TID WC  . mouth rinse  15 mL Mouth Rinse q12n4p  . pravastatin  40 mg Oral q1800  . vancomycin  125 mg Oral QID   Continuous Infusions: . sodium chloride       LOS: 6 days     Georgette Shell, MD [facility]  If 7PM-7AM, please contact night-coverage [email or web address] Password [facility] 08/25/2018, 3:54 PM

## 2018-08-25 NOTE — Progress Notes (Signed)
PT Cancellation Note  Patient Details Name: [name] MRN: [id number] DOB: 11-Nov-1941   Cancelled Treatment:    Reason Eval/Treat Not Completed: Medical issues which prohibited therapy - Per nursing, pt receiving blood transfusion due to Hgb of 7. Pt also with shortness of breath today. Will check back in tomorrow as schedule permits.   Julien Girt, PT, DPT  Pager # [phone]     Ailen Strauch D Senora Lacson 08/25/2018, 3:32 PM

## 2018-08-25 NOTE — Care Management Note (Signed)
Case Management Note  Patient Details  Name: [name] MRN: [id number] Date of Birth: April 26, 1941  Subjective/Objective: MD notified to remove d/c order since not medically stable for d/c. CSW following for SNF.                   Action/Plan:d/c SNF.   Expected Discharge Date:  08/22/18               Expected Discharge Plan:  Skilled Nursing Facility  In-House Referral:  Clinical Social Work  Discharge planning Services  CM Consult  Post Acute Care Choice:  NA Choice offered to:  NA  DME Arranged:  N/A DME Agency:  NA  HH Arranged:  NA HH Agency:  NA  Status of Service:  Completed, signed off  If discussed at [employer] of Stay Meetings, dates discussed:    Additional Comments:  Dessa Phi, RN 08/25/2018, 11:38 AM

## 2018-08-25 NOTE — Progress Notes (Signed)
Patient had a large black tarry stool. Occult card sent per MD orders. MD made aware of BM findings. Patient has already received 1 unit of PRBC per MD orders today. Will continue to monitor for bleeding.  Glennda Weatherholtz, [place] 08/25/2018

## 2018-08-25 NOTE — Consult Note (Signed)
   [facility] CM Inpatient Consult   08/25/2018  JUANITO GONYER [birth year] [id number]   [facility] Care Management follow up.   Spoke with inpatient LCSW to discuss [facility] Management is following. Discussed that patient was unable to care for himself at home prior to hospitalizations and previous SNF stays. Made aware that SNF was denied by insurance. Also made aware that patient is not stable for discharge at this time nonetheless.   Noted palliative consult is pending.   Will continue to follow along for progression and disposition plans.    Marthenia Rolling, MSN-Ed, RN,BSN [facility] Liaison [phone]

## 2018-08-25 NOTE — Progress Notes (Signed)
Patient followed by HTA/THN, noted to have prior palliative consult outpatient which has not been completed. Please consider inpatient palliative care consultation if appropriate to determine goals of care and address advance care planning issues related to this gentleman's progressive decline and high risk for readmission and over all poor prognosis.  Lane Hacker, DO Palliative Medicine'

## 2018-08-26 ENCOUNTER — Inpatient Hospital Stay: Payer: Self-pay

## 2018-08-26 ENCOUNTER — Inpatient Hospital Stay (HOSPITAL_COMMUNITY): Payer: PPO

## 2018-08-26 DIAGNOSIS — R578 Other shock: Secondary | ICD-10-CM

## 2018-08-26 DIAGNOSIS — K921 Melena: Secondary | ICD-10-CM

## 2018-08-26 DIAGNOSIS — Z452 Encounter for adjustment and management of vascular access device: Secondary | ICD-10-CM

## 2018-08-26 LAB — GLUCOSE, CAPILLARY
GLUCOSE-CAPILLARY: 128 mg/dL — AB (ref 70–99)
Glucose-Capillary: 167 mg/dL — ABNORMAL HIGH (ref 70–99)
Glucose-Capillary: 135 mg/dL — ABNORMAL HIGH (ref 70–99)
Glucose-Capillary: 141 mg/dL — ABNORMAL HIGH (ref 70–99)

## 2018-08-26 LAB — CBC WITH DIFFERENTIAL/PLATELET
BASOS ABS: 0 10*3/uL (ref 0.0–0.1)
BASOS PCT: 0 %
Eosinophils Absolute: 0.3 10*3/uL (ref 0.0–0.7)
Eosinophils Relative: 2 %
HEMATOCRIT: 20.7 % — AB (ref 39.0–52.0)
Hemoglobin: 6.6 g/dL — CL (ref 13.0–17.0)
LYMPHS PCT: 13 %
Lymphs Abs: 1.9 10*3/uL (ref 0.7–4.0)
MCH: 28.6 pg (ref 26.0–34.0)
MCHC: 31.9 g/dL (ref 30.0–36.0)
MCV: 89.6 fL (ref 78.0–100.0)
Monocytes Absolute: 0.7 10*3/uL (ref 0.1–1.0)
Monocytes Relative: 5 %
NEUTROS ABS: 11.7 10*3/uL — AB (ref 1.7–7.7)
NEUTROS PCT: 80 %
Platelets: 215 10*3/uL (ref 150–400)
RBC: 2.31 MIL/uL — ABNORMAL LOW (ref 4.22–5.81)
RDW: 18.4 % — ABNORMAL HIGH (ref 11.5–15.5)
WBC: 14.6 10*3/uL — ABNORMAL HIGH (ref 4.0–10.5)

## 2018-08-26 LAB — CBC
HEMATOCRIT: 23.5 % — AB (ref 39.0–52.0)
HEMOGLOBIN: 7.6 g/dL — AB (ref 13.0–17.0)
MCH: 29 pg (ref 26.0–34.0)
MCHC: 32.3 g/dL (ref 30.0–36.0)
MCV: 89.7 fL (ref 78.0–100.0)
Platelets: 194 10*3/uL (ref 150–400)
RBC: 2.62 MIL/uL — ABNORMAL LOW (ref 4.22–5.81)
RDW: 17.7 % — AB (ref 11.5–15.5)
WBC: 12.9 10*3/uL — ABNORMAL HIGH (ref 4.0–10.5)

## 2018-08-26 LAB — COMPREHENSIVE METABOLIC PANEL
ALBUMIN: 2 g/dL — AB (ref 3.5–5.0)
ALT: 10 U/L (ref 0–44)
ANION GAP: 5 (ref 5–15)
AST: 15 U/L (ref 15–41)
Alkaline Phosphatase: 70 U/L (ref 38–126)
BUN: 33 mg/dL — ABNORMAL HIGH (ref 8–23)
CO2: 31 mmol/L (ref 22–32)
Calcium: 8.4 mg/dL — ABNORMAL LOW (ref 8.9–10.3)
Chloride: 111 mmol/L (ref 98–111)
Creatinine, Ser: 0.79 mg/dL (ref 0.61–1.24)
GFR calc Af Amer: >60 mL/min (ref >60)
GFR calc non Af Amer: >60 mL/min (ref >60)
GLUCOSE: 159 mg/dL — AB (ref 70–99)
Potassium: 5 mmol/L (ref 3.5–5.1)
SODIUM: 147 mmol/L — AB (ref 135–145)
TOTAL PROTEIN: 5.6 g/dL — AB (ref 6.5–8.1)
Total Bilirubin: 0.5 mg/dL (ref 0.3–1.2)

## 2018-08-26 LAB — PROTIME-INR
INR: 1.21
Prothrombin Time: 15.2 seconds (ref 11.4–15.2)

## 2018-08-26 LAB — APTT: APTT: 30 s (ref 24–36)

## 2018-08-26 LAB — PREPARE RBC (CROSSMATCH)

## 2018-08-26 MED ORDER — CHLORHEXIDINE GLUCONATE CLOTH 2 % EX PADS: 6.0000 | MEDICATED_PAD | Freq: Every day | CUTANEOUS | Status: DC

## 2018-08-26 MED ORDER — ACETAMINOPHEN 325 MG PO TABS: 650.0000 mg | ORAL_TABLET | Freq: Once | ORAL | Status: DC

## 2018-08-26 MED ORDER — SODIUM CHLORIDE 0.9% FLUSH: 10.0000 mL | INTRAVENOUS | Status: DC | PRN

## 2018-08-26 MED ORDER — DIPHENHYDRAMINE HCL 50 MG/ML IJ SOLN: 25.0000 mg | Freq: Once | INTRAMUSCULAR | Status: DC

## 2018-08-26 MED ORDER — SODIUM CHLORIDE 0.9 % IV SOLN
8.0000 mg/h | INTRAVENOUS | Status: AC
  Administered 2018-08-26 – 2018-08-29 (×7): 8 mg/h via INTRAVENOUS
  Filled 2018-08-26 (×4): qty 80
  Filled 2018-08-26: qty 40
  Filled 2018-08-26 (×7): qty 80

## 2018-08-26 MED ORDER — SODIUM CHLORIDE 0.9 % IV SOLN
80.0000 mg | Freq: Once | INTRAVENOUS | Status: AC
  Administered 2018-08-26: 80 mg via INTRAVENOUS
  Filled 2018-08-26: qty 80

## 2018-08-26 MED ORDER — SODIUM CHLORIDE 0.9 % IV SOLN: INTRAVENOUS | Status: DC

## 2018-08-26 MED ORDER — PANTOPRAZOLE SODIUM 40 MG IV SOLR: 40.0000 mg | Freq: Two times a day (BID) | INTRAVENOUS | Status: DC

## 2018-08-26 MED ORDER — SODIUM CHLORIDE 0.9% FLUSH: 10.0000 mL | Freq: Two times a day (BID) | INTRAVENOUS | Status: DC

## 2018-08-26 MED ORDER — SODIUM CHLORIDE 0.9% FLUSH
10.0000 mL | Freq: Two times a day (BID) | INTRAVENOUS | Status: DC
  Administered 2018-08-27: 30 mL
  Administered 2018-08-27: 40 mL
  Administered 2018-08-28 – 2018-09-08 (×21): 10 mL
  Administered 2018-09-09: 20 mL
  Administered 2018-09-10 – 2018-09-17 (×11): 10 mL
  Administered 2018-09-17: 3 mL
  Administered 2018-09-18 – 2018-09-19 (×4): 10 mL

## 2018-08-26 MED ORDER — INSULIN ASPART 100 UNIT/ML ~~LOC~~ SOLN
0.0000 [IU] | SUBCUTANEOUS | Status: DC
  Administered 2018-08-26 – 2018-08-27 (×4): 1 [IU] via SUBCUTANEOUS
  Administered 2018-08-28 – 2018-08-30 (×6): 2 [IU] via SUBCUTANEOUS
  Administered 2018-08-30: 1 [IU] via SUBCUTANEOUS
  Administered 2018-08-30: 2 [IU] via SUBCUTANEOUS
  Administered 2018-08-31: 1 [IU] via SUBCUTANEOUS
  Administered 2018-08-31: 2 [IU] via SUBCUTANEOUS
  Administered 2018-08-31: 1 [IU] via SUBCUTANEOUS
  Administered 2018-09-01: 2 [IU] via SUBCUTANEOUS
  Administered 2018-09-01 – 2018-09-02 (×3): 1 [IU] via SUBCUTANEOUS
  Administered 2018-09-02: 2 [IU] via SUBCUTANEOUS
  Administered 2018-09-03: 1 [IU] via SUBCUTANEOUS
  Administered 2018-09-03: 2 [IU] via SUBCUTANEOUS
  Administered 2018-09-04: 1 [IU] via SUBCUTANEOUS
  Administered 2018-09-04: 2 [IU] via SUBCUTANEOUS
  Administered 2018-09-04: 1 [IU] via SUBCUTANEOUS
  Administered 2018-09-04 – 2018-09-05 (×2): 3 [IU] via SUBCUTANEOUS

## 2018-08-26 MED ORDER — SODIUM CHLORIDE 0.9 % IV SOLN
INTRAVENOUS | Status: DC
  Administered 2018-08-26 – 2018-08-27 (×3): via INTRAVENOUS

## 2018-08-26 MED ORDER — CHLORHEXIDINE GLUCONATE CLOTH 2 % EX PADS
6.0000 | MEDICATED_PAD | Freq: Every day | CUTANEOUS | Status: DC
  Administered 2018-08-27 – 2018-08-30 (×4): 6 via TOPICAL

## 2018-08-26 MED ORDER — FUROSEMIDE 10 MG/ML IJ SOLN: 20.0000 mg | Freq: Once | INTRAMUSCULAR | Status: DC

## 2018-08-26 MED ORDER — SODIUM CHLORIDE 0.9% IV SOLUTION: Freq: Once | INTRAVENOUS | Status: DC

## 2018-08-26 NOTE — Progress Notes (Signed)
Palliative consult received and changes in his condition noticed included blood transfusion and concern for GIB. I have reviewed Mr. Musson chart in detail and would like to meet with him as well as his sister who may be his HCPOA and only available advocate/spokes person. Given change in status will see tomorrow 8/28 and coordinate a time when his sister or any desired family can be present for discussion. Will request RN page if family at bedside. He at minimum needs an advance care planning conversation and be given the opportunity to complete a MOST form along with as much prognostic information as I can provide.    Lane Hacker, DO Palliative Medicine [phone]

## 2018-08-26 NOTE — Progress Notes (Signed)
[facility] Gastroenterology Progress Note  [name] 77 y.o. [birth year]  CC:  GI bleed   Subjective: GI is asked to evaluate the patient because of ongoing GI bleed. He was initially admitted to the hospital on 08/20/2018 with hypotension and black tarry stools. He declined endoscopy at that time.past medical history of C. Difficile colitis , H/O PEA arrest on August 2, history of cardiomyopathy with EF of 30-35%. Patient started noticing worsening shortness of breath today and also noted ongoing black tarry stools. CBC showed drop in hemoglobin to 6.6. Critical care was consulted. Currently transferred to ICU. Receiving blood transfusion.  ROS : positive for shortness of breath. Negative for nausea and  vomiting.   Objective: Vital signs in last 24 hours: Vitals:   08/26/18 1215 08/26/18 1500  BP: 113/65   Pulse: (!) 108 (!) 119  Resp: (!) 22 19  Temp: 98 F (36.7 C)   SpO2: 98% (!) 76%    Physical Exam:  Gen. Ill-appearing patient currently in mild respiratory distress. Heart. Distant heart sounds. Tachycardia. Lungs. Decreased breath sounds bilaterally. Abdomen. Mildly distended, non tender, bowel sounds present, no peritoneal signs. Psych : mood and affect normal.  Lab Results: Recent Labs    08/25/18 0914 08/26/18 1032  NA 145 147*  K 4.6 5.0  CL 108 111  CO2 31 31  GLUCOSE 156* 159*  BUN 24* 33*  CREATININE 0.66 0.79  CALCIUM 8.8* 8.4*   Recent Labs    08/25/18 0914 08/26/18 1032  AST 10* 15  ALT 9 10  ALKPHOS 71 70  BILITOT 0.4 0.5  PROT 5.9* 5.6*  ALBUMIN 2.2* 2.0*   Recent Labs    08/25/18 0914 08/26/18 1339  WBC 11.6* 14.6*  NEUTROABS  --  11.7*  HGB 7.0* 6.6*  HCT 23.1* 20.7*  MCV 91.3 89.6  PLT 235 215   No results for input(s): LABPROT, INR in the last 72 hours.    Assessment/Plan: - melena with acute blood loss anemia.Most likely ulcer disease. Platelets normal. LFTs normal. - respiratory distress probably combination of anemia  and cardiomyopathy. - C. Difficile colitis. Currently on by mouth vancomycin  Recommendations ------------------------- - Discussed with critical care team as well as with primary team. Plan to stabilize patient today and to do bedside EGD tomorrow morning. Transfuse as needed to keep hemoglobin around 8. Patient is agreeable for endoscopy. - continue Protonix drip  Risks (bleeding, infection, bowel perforation that could require surgery, sedation-related changes in cardiopulmonary systems), benefits (identification and possible treatment of source of symptoms, exclusion of certain causes of symptoms), and alternatives (watchful waiting, radiographic imaging studies, empiric medical treatment)  were explained to patient in detail and patient wishes to proceed.   Otis Brace MD, [place] 08/26/2018, 3:56 PM  Contact #  [phone]

## 2018-08-26 NOTE — Progress Notes (Addendum)
Subsequent discussion with daughter on the phone at patient's request-he is for some reason resistant to procedures and I have explicitly explained to him the very real risk of sudden demise in layman's terms-nursing has been present in the room with me today for over a period of 45 minutes trying to understand what the patient's concerns are regarding getting a procedure done-he is coherent enough to make phone calls call his brother as well as call his family members-it is my opinion that he does have capacity to make decisions, just that some of his decisions may not be compatible with life if he does not choose to pursue an endoscopy-I have made it explicitly clear to his daughter who I finally reached at the patient's phone that he might need an endoscopy and he might need a procedure to stop what ever is bleeding in his stomach-when I reexamined him at around 2 PM patient looks like he was having sighing breathing's once again and I asked critical care to look in on him as I feel something ominous is impending and I fear that with him being a full code we would subject him to multiple critical care interventions-he does maintain however that he would want to be a full code if something were to happen Dr. Dorise Hiss has graciously agreed to see the patient today for consideration of a scope I appreciate critical care seeing the patient in taking over management at this juncture however I am happy to reassume care once hemodynamic stability occurs-thank you  Prolonged care >45 min  Verneita Griffes, MD [facility] 2:31 PM

## 2018-08-26 NOTE — Progress Notes (Signed)
MD Samtani gave VO to draw H&H arterially d/t previously drawn CBC clotting. Unable to do feet stick d/t Unna boots on bilaterally. Respiratory notified and will come draw lab. Phlebotomy also aware. No further bloody stools seen yet this shift.

## 2018-08-26 NOTE — H&P (View-Only) (Signed)
[facility] Gastroenterology Progress Note  ARMOUR [name] 77 y.o. 11-May-1941  CC:  GI bleed   Subjective: GI is asked to evaluate the patient because of ongoing GI bleed. He was initially admitted to the hospital on 08/20/2018 with hypotension and black tarry stools. He declined endoscopy at that time.past medical history of C. Difficile colitis , H/O PEA arrest on August 2, history of cardiomyopathy with EF of 30-35%. Patient started noticing worsening shortness of breath today and also noted ongoing black tarry stools. CBC showed drop in hemoglobin to 6.6. Critical care was consulted. Currently transferred to ICU. Receiving blood transfusion.  ROS : positive for shortness of breath. Negative for nausea and  vomiting.   Objective: Vital signs in last 24 hours: Vitals:   08/26/18 1215 08/26/18 1500  BP: 113/65   Pulse: (!) 108 (!) 119  Resp: (!) 22 19  Temp: 98 F (36.7 C)   SpO2: 98% (!) 76%    Physical Exam:  Gen. Ill-appearing patient currently in mild respiratory distress. Heart. Distant heart sounds. Tachycardia. Lungs. Decreased breath sounds bilaterally. Abdomen. Mildly distended, non tender, bowel sounds present, no peritoneal signs. Psych : mood and affect normal.  Lab Results: Recent Labs    08/25/18 0914 08/26/18 1032  NA 145 147*  K 4.6 5.0  CL 108 111  CO2 31 31  GLUCOSE 156* 159*  BUN 24* 33*  CREATININE 0.66 0.79  CALCIUM 8.8* 8.4*   Recent Labs    08/25/18 0914 08/26/18 1032  AST 10* 15  ALT 9 10  ALKPHOS 71 70  BILITOT 0.4 0.5  PROT 5.9* 5.6*  ALBUMIN 2.2* 2.0*   Recent Labs    08/25/18 0914 08/26/18 1339  WBC 11.6* 14.6*  NEUTROABS  --  11.7*  HGB 7.0* 6.6*  HCT 23.1* 20.7*  MCV 91.3 89.6  PLT 235 215   No results for input(s): LABPROT, INR in the last 72 hours.    Assessment/Plan: - melena with acute blood loss anemia.Most likely ulcer disease. Platelets normal. LFTs normal. - respiratory distress probably combination of anemia  and cardiomyopathy. - C. Difficile colitis. Currently on by mouth vancomycin  Recommendations ------------------------- - Discussed with critical care team as well as with primary team. Plan to stabilize patient today and to do bedside EGD tomorrow morning. Transfuse as needed to keep hemoglobin around 8. Patient is agreeable for endoscopy. - continue Protonix drip  Risks (bleeding, infection, bowel perforation that could require surgery, sedation-related changes in cardiopulmonary systems), benefits (identification and possible treatment of source of symptoms, exclusion of certain causes of symptoms), and alternatives (watchful waiting, radiographic imaging studies, empiric medical treatment)  were explained to patient in detail and patient wishes to proceed.   Otis Brace MD, [place] 08/26/2018, 3:56 PM  Contact #  [phone]

## 2018-08-26 NOTE — Progress Notes (Signed)
Transferred to SDU-Rectal bleed-hgb-6.6, 1u prbc, critical care following.Palliative cons.D/c plan SNF once medically stable.

## 2018-08-26 NOTE — Progress Notes (Signed)
Nutrition Follow-up  DOCUMENTATION CODES:   Non-severe (moderate) malnutrition in context of chronic illness, Obesity unspecified  INTERVENTION:  - Diet re-advancement as medically feasible.  NUTRITION DIAGNOSIS:   Moderate Malnutrition related to chronic illness(chronic osteomyelitis) as evidenced by mild fat depletion, moderate muscle depletion. -ongoing  GOAL:   Patient will meet greater than or equal to 90% of their needs -unmet  MONITOR:   Diet advancement, Weight trends, Labs, Skin, I & O's  ASSESSMENT:   77 yo male who was admitted due to melena and hypotension, hypovolemic shock d/t acute blood loss anemia complicated by AKI. He has PMH significant for He chronic osteomyelitis, CHF, type 2 DM, GERD, hiatal hernia, and HTN.  He was currently treated for C. difficile colitis, and noticed at home melanotic stools. Recent hospitalization for sepsis and cardiac arrest 07/31/18.  Weight fluctuations since admission; will continue to monitor closely. Per chart review, patient consumed 75% of dinner on 8/24, 30% of lunch on 8/26, and 25% of lunch on 8/27. Patient mainly focused on frustration with still being in the hospital and feeling that staff is communicating with his daughter rather than him about POC. Patient denies abdominal pain or nausea at this time. He denies having breakfast or lunch today but later states that he consumed 3 cups of ice cream.   Talked with Dr. Verlon Au who reports patient to now be NPO d/t severe GIB. Brief note by Palliative Care MD yesterday with plan for meeting with family and patient today.      Medications reviewed; 20 mg oral Pepcid/day, 20 mg IV Lasix x1 dose today, sliding scale Novolog, 8 mg/hr IV Protonix x3 days starting today.  Labs reviewed; CBG: 167 mg/dL today, Na: 147 mmol/L, BUN: 33 mg/dL, Ca: 8.4 mg/dL. IVF: NS @ 75 mL/hr previously, increased to 150 mL/hr at 11:45 AM today.     Diet Order:   Diet Order            Diet NPO time  specified  Diet effective now        Diet - low sodium heart healthy              EDUCATION NEEDS:   No education needs have been identified at this time  Skin:  Skin Assessment: Skin Integrity Issues: Skin Integrity Issues:: Unstageable Stage II: L heel, L thigh, coccyx, sacrum Unstageable: full thickness to R toe  Last BM:  8/28  Height:   Ht Readings from Last 1 Encounters:  08/19/18 6' (1.829 m)    Weight:   Wt Readings from Last 1 Encounters:  08/26/18 135.6 kg    Ideal Body Weight:  80.91 kg  BMI:  Body mass index is 40.55 kg/m.  Estimated Nutritional Needs:   Kcal:  6546-5035 (15-17 kcal/kg)  Protein:  120-130 grams  Fluid:  >/= 2 L/day      Jarome Matin, MS, RD, LDN, [facility] Inpatient Clinical Dietitian Pager # [phone] After hours/weekend pager # [phone]

## 2018-08-26 NOTE — Care Management Important Message (Signed)
Important Message  Patient Details  Name: [name] MRN: [id number] Date of Birth: 1941-02-03   Medicare Important Message Given:  Yes    Kerin Salen 08/26/2018, 2:07 [place] Message  Patient Details  Name: [name] MRN: [id number] Date of Birth: 1941-03-30   Medicare Important Message Given:  Yes    Kerin Salen 08/26/2018, 2:06 PM

## 2018-08-26 NOTE — Progress Notes (Signed)
MD Samtani gave VO to increase NS to 150 ml/hr until blood is able to be infused.

## 2018-08-26 NOTE — Progress Notes (Signed)
PT Cancellation Note  Patient Details Name: ANDRI PRESTIA MRN: [id number] DOB: [birth year]   Cancelled Treatment:    Reason Eval/Treat Not Completed: Medical issues which prohibited therapy - Per nursing, pt with bloody stools all day, with suspected transfer to stepdown ICU. Nursing deferring PT until tomorrow. Will continue to follow acutely.   Julien Girt, PT, DPT  Pager # [phone]     Dylan Monforte D Jessie Schrieber 08/26/2018, 1:57 PM

## 2018-08-26 NOTE — Progress Notes (Signed)
Patient had very large, dark black/red BM down to his knees. BM had large clots in it also. MD Samtani aware. VSS, pt continues to have labored breathing at times. No signs of distress however, O2 Sats 98% 1L/[place]. IV team to place midline- will ask to draw CBC at that time.

## 2018-08-26 NOTE — Progress Notes (Signed)
[facility] PROGRESS NOTE  [name] DJS:[id number] DOB: 04-16-1941 DOA: 08/19/2018 PCP: [place] Bing, DO   Narrative: 77 year old male recent short-term resident of [facility] Recent admission by family medicine teaching service 7/30-08/07/2018 secondary to septic shock from UTI/pneumonia/soft tissue infection confounded by new sys HF heart failure AKI-had PEA cardiac arrest lasting 2 minutes 07/31/2018 -poor cardiac cath candidate as an outpatient was being treated for C. difficile colitis  Readmitted at [facility] by critical care 8/21 with black tarry stools Hypotension-on admission hemoglobin 6.7 white count 12.6 creatinine 111/2.1-also hyponatremic on admission GI was consulted-patient declined endoscopy to evaluate for source of bleeding transfusions this admission-he was given IV iron and multiple antihypertensives including losartan metoprolol torsemide held  Was scheduled for discharge 8/24 decompensated had acute shortness of breath as well as large dark BM-hemoglobin confirmed to be 7.0 range-palliative care consulted  A & Plan Hypovolemic shock secondary to acute GI bleed impending-becoming slightly tachycardic hypotensive and now having abdominal pain-2 large BMs since 8/27 -0-hemoglobin now 6.7 1 more unit of packed red blood cells, start PPI infusion-consulting gastroenterology for procedure  AKI-as above-IV saline-monitor  Recent PEA cardiac arrest-systolic heart failure EF 30%-careful with fluids-increased rate to 150 cc an hour temporarily-very high risk for deterioration-metoprolol 12.5 twice daily on hold, torsemide 40 daily on hold, losartan 25 daily on hold at this time  Chronic venous insufficiency with decubitus wounds-Unna boots on-did not review the same today  Anemia of renal insufficiency-iron 29, sat ratios 16-may need IV iron if he survives this hospitalization  Diabetes mellitus type 2-continue sliding scale sensitive n.p.o. coverage at this  time, blood sugar 1 39-1 59  C. difficile colitis-treated as an outpatient since his stay at [facility] facility-has received probably over 14 days we will need to get records from [facility]-continue p.o. vancomycin and may need to superimposed treatment over PPI infusion given acute bleeding   Critically ill needs to transfer to stepdown-no family present-presumed full code-inpatient status    Verlon Au, MD  [facility] Direct contact: [phone] --Via amion app OR  --[email or web address]; password [email or web address]  7PM-7AM contact night coverage as above 08/26/2018, 8:23 AM  LOS: 7 days   Consultants:  Palliative care, GI  Procedures:  Multiple  Antimicrobials:  Oral vancomycin  Interval history/Subjective: Seen earlier on rounds and then reviewed later on this morning Patient appears to be having sighing deep respirations but is oriented and alert  Objective:  Vitals:  Vitals:   08/25/18 2041 08/26/18 0417  BP: 135/64 109/62  Pulse: 93 99  Resp: 20 20  Temp: 98.5 F (36.9 C) 98.4 F (36.9 C)  SpO2: 93% 95%    Exam:  Awake alert in some cardiorespiratory distress ++ JVD, no bruit Chest has some coarse rales Not tender in the abdomen EOMI NCAT No lower extremity edema but Unna boots are on Coherent making sense Neurologically intact I have personally reviewed the following:   Labs:  Hemoglobin 6.6 white count 14.6 platelet 215  Sodium 147 BUN/creatinine 33/0.7  Imaging studies:  No  Medical tests:  No  Test discussed with performing physician:  Yes discussed with critical care  Decision to obtain old records:  Yes  Review and summation of old records: Yes Scheduled Meds: . chlorhexidine  15 mL Mouth Rinse BID  . famotidine  20 mg Oral Daily  . insulin aspart  0-9 Units Subcutaneous TID WC  . mouth rinse  15 mL Mouth Rinse q12n4p  . pravastatin  40 mg  Oral q1800  . vancomycin  125 mg Oral QID   Continuous Infusions: . sodium  chloride      Active Problems:   Gastrointestinal hemorrhage   Pressure injury of skin   Hemorrhagic shock (HCC)   Malnutrition of moderate degree   Tachypnea   LOS: 7 days

## 2018-08-26 NOTE — Procedures (Signed)
Central Venous Catheter Insertion Procedure Note MAJED PELLEGRIN [id number] 15-May-1941  Procedure: Insertion of Central Venous Catheter Indications: Assessment of intravascular volume, Drug and/or fluid administration and Frequent blood sampling  Procedure Details Consent: Risks of procedure as well as the alternatives and risks of each were explained to the (patient/caregiver).  Consent for procedure obtained. Time Out: Verified patient identification, verified procedure, site/side was marked, verified correct patient position, special equipment/implants available, medications/allergies/relevent history reviewed, required imaging and test results available.  Performed Real time [place] was used to ID and cannulate the vessel  Maximum sterile technique was used including antiseptics, cap, gloves, gown, hand hygiene, mask and sheet. Skin prep: Chlorhexidine; local anesthetic administered A antimicrobial bonded/coated triple lumen catheter was placed in the right internal jugular vein using the Seldinger technique.  Evaluation Blood flow good Complications: No apparent complications Patient did tolerate procedure well. Chest X-ray ordered to verify placement.  CXR: pending.  Clementeen Graham 08/26/2018, 3:45 PM  Erick Colace ACNP-BC [place] Pager # [phone] OR # [phone] if no answer

## 2018-08-26 NOTE — Progress Notes (Signed)
CRITICAL VALUE ALERT  Critical Value:  Hgb 6.6  Date & Time Notied:  08/26/18, 1404  Provider Notified: [place]  Orders Received/Actions taken: Will give ordered unit of blood as soon as IVT is able to start new PIV. MD aware.

## 2018-08-26 NOTE — Progress Notes (Addendum)
[name]  OEV:[id number] DOB: 01/23/1941 DOA: 08/19/2018 PCP: Rosebud Bing, DO    LOS: 7 days   Reason for Consult / Chief Complaint:  GI bleed  Consulting MD and date:  [name] MD- 8/28  HPI/Summary of hospital stay:  77 year old nursing home resident with recent admission for septic shock, UTI.pneumonia/soft tissue infection from 7/30-8/9. Hospital course significant for heart failure, PEA arrest for 2 mins on 8/2, Developed C. difficile colitis in nursing home after discharge.  8/21- Admitted to ICU on 8/21 with black tarry stool, shock. GI consulted but patient declined scope 8/23- Transferred from ICU to [facility] 8/27- Developed dyspnea, tachypnea, large black stool 8/28- Transfer back to ICU for ongoing bleed, hypotension. PCCM re consulted.  Subjective:  Patient examined in the ICU.  Complains of dyspnea.  Mental status is intact.  Objective   Blood pressure 113/65, pulse (!) 108, temperature 98 F (36.7 C), temperature source Oral, resp. rate (!) 22, height 6' (1.829 m), weight 135.6 kg, SpO2 98 %.        Intake/Output Summary (Last 24 hours) at 08/26/2018 1430 Last data filed at 08/26/2018 0950 Gross per 24 hour  Intake 340.5 ml  Output 1540 ml  Net -1199.5 ml   Filed Weights   08/22/18 0525 08/23/18 0504 08/26/18 0417  Weight: (!) 151 kg (!) 148.8 kg 135.6 kg   Examination: Gen:      Distress, chronically ill-appearing. HEENT:  EOMI, sclera anicteric Neck:     No masses; no thyromegaly Lungs:    Tachypnea, clear to auscultation anteriorly with no wheeze or crackles CV:         Cardiac, regular.  No murmurs Abd:   Obese, distended.  Soft, positive bowel sounds with no guarding or rigidity Ext:    Lower extremity with chronic venous stasis, 1-2+ edema, Kerlex bandages. Skin:      Warm and dry; no rash Neuro: alert and oriented x 3 no focal deficits  Consultants:  GI 8/22- Signed off as pt refused scope, re consulted 8/28 Palliative care 8/27-  Pending   Procedures: CVL 8/28 >  Significant Diagnostic Tests: Chest x-ray 08/19/18-chronic left diaphragm elevation, atelectasis Chest x-ray 08/25/2018- increasing bilateral atelectasis, perihilar markings, chronic left diaphragm elevation. I reviewed the images personally.  Echo 08/02/2018-LVEF 30-35%, severe hypokinesis of mid-apicalanteroseptal, anterior, and apical myocardium.  Grade 1 diastolic dysfunction.  PA systolic pressure within normal range.  Micro Data: Blood cultures 8/21-negative MRSA PCR 8/21-negative Urine culture 8/21-multiple species  Antimicrobials:  PO. Vancomycin 8/21 >>  Assessment & Plan:  77 year old with chronic medical problems, nursing home resident admitted with GI bleed, hemorrhagic shock.  GI bleed Transfer to ICU, transfuse PRBC.  Goal Hb greater than 7 PPI drip Re consult GI  Cardiomyopathy. EF 30-35% HTN Holding outpt aspirin, Lopressor, Cozaar, statin Low EF noted on previous admission after cardiac arrest. Prior to that EF was ok Consider repeat echocardiogram at some point to check for any recovery  Blood loss anemia, leukocytosis Elevated WBC count likely from stress.  Continue to monitor  C. difficile colitis On p.o. Vanco therapy. Continue for now.  Will need to determine ultimate duration of therapy once he stabilized.  Diabetes SSI coverage  History of prostate adenocarcinoma Follow-up with [facility] as outpatient.  LE venous ulcers Wound care  Disposition / Summary of Today's Plan 08/26/18   We will transfer to ICU for PPI drip, PRBC transfusion, central line placement, GI reconsulted Palliative care to  see patient to determine goals of care.  Best Practice / Goals of Care / Disposition.   DVT prophylaxis: SCDs, Unna boots GI prophylaxis: PPI drip Diet: NPO Mobility:Bed rest Code Status: Full Code Family Communication: Daughter updated 8/28.  Labs   CBC: Recent Labs  Lab 08/19/18 1517  08/19/18 2052  08/20/18 0335 08/21/18 0734 08/22/18 0541 08/25/18 0914 08/26/18 1339  WBC 12.6*  --  14.6* 13.2*  --  10.3 11.6* 14.6*  NEUTROABS 10.3*  --   --   --   --  7.1  --  PENDING  HGB 6.7*   < > 8.0* 8.0* 8.2* 8.0* 7.0* 6.6*  HCT 21.3*   < > 25.1* 25.1* 25.6* 25.5* 23.1* 20.7*  MCV 83.9  --  83.7 84.2  --  89.8 91.3 89.6  PLT 311  --  136* 227  --  230 235 215   < > = values in this interval not displayed.   Basic Metabolic Panel: Recent Labs  Lab 08/19/18 1451 08/19/18 1527 08/20/18 0335 08/22/18 0541 08/25/18 0914 08/26/18 1032  NA 143 141 146* 148* 145 147*  K 3.9 3.7 3.7 4.0 4.6 5.0  CL 96* 91* 104 112* 108 111  CO2 34*  --  33* 31 31 31   GLUCOSE 205* 202* 158* 145* 156* 159*  BUN 109* 111* 100* 39* 24* 33*  CREATININE 2.15* 2.10* 1.83* 0.89 0.66 0.79  CALCIUM 8.1*  --  8.0* 8.6* 8.8* 8.4*   GFR: Estimated Creatinine Clearance: 108.5 mL/min (by C-G formula based on SCr of 0.79 mg/dL). Recent Labs  Lab 08/19/18 1527  08/20/18 0335 08/22/18 0541 08/25/18 0914 08/26/18 1339  WBC  --    < > 13.2* 10.3 11.6* 14.6*  LATICACIDVEN 1.20  --   --   --   --   --    < > = values in this interval not displayed.   Liver Function Tests: Recent Labs  Lab 08/19/18 1451 08/25/18 0914 08/26/18 1032  AST 9* 10* 15  ALT 8 9 10   ALKPHOS 65 71 70  BILITOT 0.3 0.4 0.5  PROT 6.4* 5.9* 5.6*  ALBUMIN 2.1* 2.2* 2.0*   Recent Labs  Lab 08/19/18 1517  LIPASE 21   No results for input(s): AMMONIA in the last 168 hours. ABG    Component Value Date/Time   PHART 7.327 (L) 08/01/2018 0553   PCO2ART 60.5 (H) 08/01/2018 0553   PO2ART 157 (H) 08/01/2018 0553   HCO3 30.8 (H) 08/01/2018 0553   TCO2 35 (H) 08/19/2018 1527   ACIDBASEDEF 0.3 07/31/2018 1121   O2SAT 99.0 08/01/2018 0553    Coagulation Profile: Recent Labs  Lab 08/19/18 1517  INR 1.18   Cardiac Enzymes: Recent Labs  Lab 08/25/18 0914 08/25/18 1458 08/25/18 2102  TROPONINI <0.03 <0.03 <0.03   HbA1C: Hgb A1c  MFr Bld  Date/Time Value Ref Range Status  05/19/2018 08:48 AM 9.3 (H) 4.8 - 5.6 % Final    Comment:    (NOTE) Pre diabetes:          5.7%-6.4% Diabetes:              >6.4% Glycemic control for   <7.0% adults with diabetes   03/15/2017 05:51 AM 14.3 (H) 4.8 - 5.6 % Final    Comment:    (NOTE)         Pre-diabetes: 5.7 - 6.4         Diabetes: >6.4  Glycemic control for adults with diabetes: <7.0    CBG: Recent Labs  Lab 08/25/18 0732 08/25/18 1130 08/25/18 1626 08/26/18 0728 08/26/18 1159  GLUCAP 172* 145* 129* 167* 135*    Review of Systems:   All negative; except for those that are bolded, which indicate positives.  Constitutional: weight loss, weight gain, night sweats, fevers, chills, fatigue, weakness.  HEENT: headaches, sore throat, sneezing, nasal congestion, post nasal drip, difficulty swallowing, tooth/dental problems, visual complaints, visual changes, ear aches. Neuro: difficulty with speech, weakness, numbness, ataxia. CV:  chest pain, orthopnea, PND, swelling in lower extremities, dizziness, palpitations, syncope.  Resp: cough, hemoptysis, dyspnea, wheezing. GI: heartburn, indigestion, abdominal pain, nausea, vomiting, diarrhea, constipation, change in bowel habits, loss of appetite, hematemesis, melena, hematochezia.  GU: dysuria, change in color of urine, urgency or frequency, flank pain, hematuria. MSK: joint pain or swelling, decreased range of motion. Psych: change in mood or affect, depression, anxiety, suicidal ideations, homicidal ideations. Skin: rash, itching, bruising.  The patient is critically ill with multiple organ system failure and requires high complexity decision making for assessment and support, frequent evaluation and titration of therapies, advanced monitoring, review of radiographic studies and interpretation of complex data.   Critical Care Time devoted to patient care services, exclusive of separately billable procedures,  described in this note is 35  minutes.    Marshell Garfinkel MD [facility] 08/26/2018, 3:10 PM

## 2018-08-27 ENCOUNTER — Ambulatory Visit: Payer: Self-pay | Admitting: *Deleted

## 2018-08-27 ENCOUNTER — Encounter (HOSPITAL_COMMUNITY): Payer: Self-pay | Admitting: *Deleted

## 2018-08-27 ENCOUNTER — Encounter (HOSPITAL_COMMUNITY)
Admission: EM | Disposition: A | Discharge status: Skilled Nursing Facility | Payer: Self-pay | Source: Home / Self Care | Attending: Internal Medicine

## 2018-08-27 HISTORY — PX: SUBMUCOSAL INJECTION: SHX5543

## 2018-08-27 HISTORY — PX: ESOPHAGOGASTRODUODENOSCOPY (EGD) WITH PROPOFOL: SHX5813

## 2018-08-27 HISTORY — PX: HOT HEMOSTASIS: SHX5433

## 2018-08-27 LAB — CBC
HCT: 21.4 % — ABNORMAL LOW (ref 39.0–52.0)
HCT: 25 % — ABNORMAL LOW (ref 39.0–52.0)
HEMATOCRIT: 25.1 % — AB (ref 39.0–52.0)
HEMOGLOBIN: 8.1 g/dL — AB (ref 13.0–17.0)
Hemoglobin: 6.7 g/dL — CL (ref 13.0–17.0)
Hemoglobin: 8.1 g/dL — ABNORMAL LOW (ref 13.0–17.0)
MCH: 28.3 pg (ref 26.0–34.0)
MCH: 29.2 pg (ref 26.0–34.0)
MCH: 29.5 pg (ref 26.0–34.0)
MCHC: 31.3 g/dL (ref 30.0–36.0)
MCHC: 32.4 g/dL (ref 30.0–36.0)
MCHC: 32.3 g/dL (ref 30.0–36.0)
MCV: 90.3 fL (ref 78.0–100.0)
MCV: 90.3 fL (ref 78.0–100.0)
MCV: 91.3 fL (ref 78.0–100.0)
PLATELETS: 176 10*3/uL (ref 150–400)
Platelets: 155 10*3/uL (ref 150–400)
Platelets: 170 10*3/uL (ref 150–400)
RBC: 2.37 MIL/uL — ABNORMAL LOW (ref 4.22–5.81)
RBC: 2.77 MIL/uL — AB (ref 4.22–5.81)
RBC: 2.75 MIL/uL — AB (ref 4.22–5.81)
RDW: 18.3 % — AB (ref 11.5–15.5)
RDW: 17.3 % — ABNORMAL HIGH (ref 11.5–15.5)
RDW: 17.7 % — ABNORMAL HIGH (ref 11.5–15.5)
WBC: 13.1 10*3/uL — ABNORMAL HIGH (ref 4.0–10.5)
WBC: 12.1 10*3/uL — AB (ref 4.0–10.5)
WBC: 12.2 10*3/uL — AB (ref 4.0–10.5)

## 2018-08-27 LAB — GLUCOSE, CAPILLARY
GLUCOSE-CAPILLARY: 103 mg/dL — AB (ref 70–99)
GLUCOSE-CAPILLARY: 117 mg/dL — AB (ref 70–99)
GLUCOSE-CAPILLARY: 119 mg/dL — AB (ref 70–99)
GLUCOSE-CAPILLARY: 121 mg/dL — AB (ref 70–99)
Glucose-Capillary: 129 mg/dL — ABNORMAL HIGH (ref 70–99)
Glucose-Capillary: 97 mg/dL (ref 70–99)
Glucose-Capillary: 94 mg/dL (ref 70–99)

## 2018-08-27 LAB — COMPREHENSIVE METABOLIC PANEL
ALBUMIN: 2 g/dL — AB (ref 3.5–5.0)
ALT: 7 U/L (ref 0–44)
ANION GAP: 4 — AB (ref 5–15)
AST: 10 U/L — ABNORMAL LOW (ref 15–41)
Alkaline Phosphatase: 69 U/L (ref 38–126)
BUN: 30 mg/dL — ABNORMAL HIGH (ref 8–23)
CALCIUM: 7.9 mg/dL — AB (ref 8.9–10.3)
CO2: 31 mmol/L (ref 22–32)
Chloride: 115 mmol/L — ABNORMAL HIGH (ref 98–111)
Creatinine, Ser: 0.79 mg/dL (ref 0.61–1.24)
GFR calc Af Amer: >60 mL/min (ref >60)
Glucose, Bld: 141 mg/dL — ABNORMAL HIGH (ref 70–99)
Potassium: 4.2 mmol/L (ref 3.5–5.1)
Sodium: 150 mmol/L — ABNORMAL HIGH (ref 135–145)
TOTAL PROTEIN: 5.1 g/dL — AB (ref 6.5–8.1)
Total Bilirubin: 0.6 mg/dL (ref 0.3–1.2)

## 2018-08-27 LAB — PREPARE RBC (CROSSMATCH)

## 2018-08-27 SURGERY — ESOPHAGOGASTRODUODENOSCOPY (EGD) WITH PROPOFOL
Anesthesia: Moderate Sedation

## 2018-08-27 MED ORDER — DIPHENHYDRAMINE HCL 50 MG/ML IJ SOLN
INTRAMUSCULAR | Status: AC
  Filled 2018-08-27: qty 1

## 2018-08-27 MED ORDER — MIDAZOLAM HCL 5 MG/ML IJ SOLN
INTRAMUSCULAR | Status: AC
  Filled 2018-08-27: qty 2

## 2018-08-27 MED ORDER — SODIUM CHLORIDE 0.9 % IJ SOLN
PREFILLED_SYRINGE | INTRAMUSCULAR | Status: DC | PRN
  Administered 2018-08-27: 1 mL

## 2018-08-27 MED ORDER — SODIUM CHLORIDE 0.9% IV SOLUTION
Freq: Once | INTRAVENOUS | Status: AC
  Administered 2018-08-27: 05:00:00 via INTRAVENOUS

## 2018-08-27 MED ORDER — FENTANYL CITRATE (PF) 100 MCG/2ML IJ SOLN
INTRAMUSCULAR | Status: AC
  Filled 2018-08-27: qty 2

## 2018-08-27 MED ORDER — FENTANYL CITRATE (PF) 100 MCG/2ML IJ SOLN
INTRAMUSCULAR | Status: DC | PRN
  Administered 2018-08-27: 25 ug via INTRAVENOUS

## 2018-08-27 MED ORDER — MIDAZOLAM HCL 5 MG/ML IJ SOLN
INTRAMUSCULAR | Status: DC | PRN
  Administered 2018-08-27: 2 mg via INTRAVENOUS
  Administered 2018-08-27: 1 mg via INTRAVENOUS

## 2018-08-27 SURGICAL SUPPLY — 15 items

## 2018-08-27 NOTE — Progress Notes (Signed)
PT Cancellation Note  Patient Details Name: WEAVER TWEED MRN: [id number] DOB: 1941-12-08   Cancelled Treatment:    Reason Eval/Treat Not Completed: Medical issues which prohibited therapy, to get 2 units of blood. Check back  Another time.  Claretha Cooper 08/27/2018, 7:30 AM Tresa Endo PT [phone]

## 2018-08-27 NOTE — Interval H&P Note (Signed)
History and Physical Interval Note:  08/27/2018 10:48 AM  [name]  has presented today for surgery, with the diagnosis of Melena. The various methods of treatment have been discussed with the patient and family. After consideration of risks, benefits and other options for treatment, the patient has consented to  Procedure(s) with comments: ESOPHAGOGASTRODUODENOSCOPY (EGD) WITH PROPOFOL (N/A) - bedside EGD as a surgical intervention .  The patient's history has been reviewed, patient examined, no change in status, stable for surgery.  I have reviewed the patient's chart and labs.  Questions were answered to the patient's satisfaction.     Shown Dissinger

## 2018-08-27 NOTE — Op Note (Signed)
[facility] Patient Name: [name] Procedure Date: 08/27/2018 MRN: [id number] Attending MD: Otis Brace , MD Date of Birth: October 01, 1941 CSN: [id number] Age: 77 Admit Type: Inpatient Procedure:                Upper GI endoscopy Indications:              Melena Providers:                Otis Brace, MD, Carmie End, RN, Cletis Athens, Technician, Alan Mulder, Technician Referring MD:              Medicines:                Sedation Administered by an Endoscopy Nurse,                            Fentanyl 25 micrograms IV, Midazolam 3 mg IV Complications:            No immediate complications. Estimated Blood Loss:     Estimated blood loss was minimal. Procedure:                Pre-Anesthesia Assessment:                           - Prior to the procedure, a History and Physical                            was performed, and patient medications and                            allergies were reviewed. The patient's tolerance of                            previous anesthesia was also reviewed. The risks                            and benefits of the procedure and the sedation                            options and risks were discussed with the patient.                            All questions were answered, and informed consent                            was obtained. Prior Anticoagulants: The patient has                            taken no previous anticoagulant or antiplatelet                            agents. ASA Grade Assessment: III - A patient with  severe systemic disease. After reviewing the risks                            and benefits, the patient was deemed in                            satisfactory condition to undergo the procedure.                           After obtaining informed consent, the endoscope was                            passed under direct vision. Throughout the            procedure, the patient's blood pressure, pulse, and                            oxygen saturations were monitored continuously. The                            [id number]) scope was introduced through                            the mouth, and advanced to the second part of                            duodenum. The [id number] ([id number]) Olympus adult                            endoscope was introduced through the and advanced                            to the. The upper GI endoscopy was technically                            difficult and complex due to the patient's poor                            cooperation. The patient tolerated the procedure                            well. Scope In: Scope Out: Findings:      No gross lesions were noted in the entire esophagus.      Hematin (altered blood/coffee-ground-like material) was found in the       entire examined stomach.      A large non-bleeding diverticulum was found in the gastric fundus. there       was an area of adherent blood clot in the cardia but no evidence of       active bleeding.      Diffuse mild inflammation was found in the gastric body and in the       gastric antrum. Biopsies were taken with a cold forceps for histology.      Two non-obstructing oozing cratered duodenal ulcers with a visible       vessel were found  in the duodenal bulb.one small clean-based ulcer and       there was one large ulcer with 20 mm in largest dimension. Biopsies were       taken with a cold forceps for histology. Area was injected with 1 mL of       a 1:10,000 solution of epinephrine for hemostasis. Fulguration to stop       the bleeding by bipolar probe was unsuccessful. there was active oozing       of blood after fulguration. Subsequently upper endoscope was withdrawn.       Double-channel therapeutic endoscope was inserted. Bleeding appeared to       be slowed down. Hemo-spray was applied. Hemostasis was achieved.      The second  portion of the duodenum was normal. Impression:               - No gross lesions in esophagus.                           - Hematin (altered blood/coffee-ground-like                            material) in the entire stomach.                           - Gastric diverticulum.                           - Gastritis. Biopsied.                           - Multiple non-obstructing oozing duodenal ulcers                            with a visible vessel. Biopsied. Treatment not                            successful. Treatment not successful. Treated with                            bipolar cautery.                           - Normal second portion of the duodenum. Moderate Sedation:      Moderate (conscious) sedation was administered by the endoscopy nurse       and supervised by the endoscopist. The following parameters were       monitored: oxygen saturation, heart rate, blood pressure, and response       to care. Recommendation:           - Return patient to ICU for ongoing care.                           - NPO.                           - Continue present medications.                           - Await pathology  results.                           - Refer to an interventional radiologist if                            symptoms persist. Procedure Code(s):        --- Professional ---                           [phone], 83, Esophagogastroduodenoscopy, flexible,                            transoral; with control of bleeding, any method                           43239, Esophagogastroduodenoscopy, flexible,                            transoral; with biopsy, single or multiple Diagnosis Code(s):        --- Professional ---                           K92.2, Gastrointestinal hemorrhage, unspecified                           K31.4, Gastric diverticulum                           K29.70, Gastritis, unspecified, without bleeding                           K26.4, Chronic or unspecified duodenal ulcer with                             hemorrhage                           K92.1, Melena (includes Hematochezia) CPT copyright 2017 [employer]. All rights reserved. The codes documented in this report are preliminary and upon coder review may  be revised to meet current compliance requirements. Otis Brace, MD Otis Brace, MD 08/27/2018 1:35:49 PM Number of Addenda: 0

## 2018-08-27 NOTE — Care Management Note (Signed)
Case Management Note  Patient Details  Name: [name] MRN: [id number] Date of Birth: 19-Jun-1941  Subjective/Objective:                  From chart- Subjective: GI is asked to evaluate the patient because of ongoing GI bleed. He was initially admitted to the hospital on 08/20/2018 with hypotension and black tarry stools. He declined endoscopy at that time.past medical history of C. Difficile colitis , H/O PEA arrest on August 2, history of cardiomyopathy with EF of 30-35%. Patient started noticing worsening shortness of breath today and also noted ongoing black tarry stools. CBC showed drop in hemoglobin to 6.6. Critical care was consulted. Currently transferred to ICU. Receiving blood transfusion.  ROS : positive for shortness of breath. Negative for nausea and  vomiting. Gen. Ill-appearing patient currently in mild respiratory distress. Heart. Distant heart sounds. Tachycardia. Lungs. Decreased breath sounds bilaterally. Abdomen. Mildly distended, non tender, bowel sounds present, no peritoneal signs. Psych : mood and affect normal. Action/Plan:  Discharge readiness is indicated by patient meeting Recovery Milestones, including ALL of the following: ? Hypotension absent      85/45 ? Surgical and other acute interventions not needed gi consult  ? Bleeding absent or minimal continues to have bloody stools ? Stable Hgb/Hct  hgb-6.6/hct-21.4  rec'ing 2 units of prbc's ? Pain absent or managed  managed ? Ambulatory not at this time ? Oral hydration, medications, and diet ? Iv ns at kvo/ivprotonix  Discharge plans and education understood not at this time/egd plaaned for [id number].  Expected Discharge Date:  08/22/18               Expected Discharge Plan:  Skilled Nursing Facility  In-House Referral:  Clinical Social Work  Discharge planning Services  CM Consult  Post Acute Care Choice:  NA Choice offered to:  NA  DME Arranged:  N/A DME Agency:  NA  HH Arranged:  NA HH  Agency:  NA  Status of Service:  Completed, signed off  If discussed at [employer] of Stay Meetings, dates discussed:    Additional Comments:  Leeroy Cha, RN 08/27/2018, 8:47 AM

## 2018-08-27 NOTE — Progress Notes (Addendum)
[name]  UJW:[id number] DOB: [birth year] DOA: 08/19/2018 PCP: Manvel Bing, DO    LOS: 8 days   Reason for Consult / Chief Complaint:  GI bleed  Consulting MD and date:  [name] MD- 8/28  HPI/Summary of hospital stay:  77 year old nursing home resident with recent admission for septic shock, UTI.pneumonia/soft tissue infection from 7/30-8/9. Hospital course significant for heart failure, PEA arrest for 2 mins on 8/2, Developed C. difficile colitis in nursing home after discharge.  8/21- Admitted to ICU on 8/21 with black tarry stool, shock. GI consulted but patient declined scope 8/23- Transferred from ICU to [facility] 8/27- Developed dyspnea, tachypnea, large black stool 8/28- Transfer back to ICU for ongoing bleed, hypotension. PCCM re consulted.  PPI infusion started GI consulted again 8/29: As of a.m. rounds on 8/29 has now received 3 units of PRBCs.  Hemoglobin on 826 down to 6.6, improved to 7.6 following transfusion but once again down to 6.7. Hemodynamically stable. Awaiting EGD. Subjective:  Denies pain or shortness of breath  Objective   Blood pressure (Abnormal) 122/57, pulse 97, temperature 98.3 F (36.8 C), temperature source Oral, resp. rate (Abnormal) 22, height 6' (1.829 m), weight (Abnormal) 136.7 kg, SpO2 100 %. CVP:  [5 mmHg-8 mmHg] 6 mmHg      Intake/Output Summary (Last 24 hours) at 08/27/2018 0851 Last data filed at 08/27/2018 0740 Gross per 24 hour  Intake 1976.3 ml  Output 600 ml  Net 1376.3 ml   Filed Weights   08/23/18 0504 08/26/18 0417 08/27/18 0500  Weight: (Abnormal) 148.8 kg 135.6 kg (Abnormal) 136.7 kg   Examination: General: Chronically ill 77 year old African-American male.  He is currently awake, alert, he is in no acute distress, his work of breathing has improved in comparison to exam on 8/28 HEENT normocephalic atraumatic no jugular venous distention mucous membranes are moist Pulmonary: Clear to auscultation, work of breathing  improved in comparison to yesterday, diminished in the bases Cardiac: Regular rate and rhythm normal sinus rhythm on telemetry holosystolic murmur appreciated approximately 4 out of 6 Abdomen: Soft, not tender this morning.  Hyperactive bowel sounds.  No organomegaly. Extremities: Chronic dermatological changes noted, warm, CMS intact.  Wrapped with Unna boot's bilaterally. Neuro: Awake, oriented, appropriate moves all extremities but is diffusely weak  Consultants: date/date of s/o (if indicated) and final recs:    GI 8/22- Signed off as pt refused scope, re consulted 8/28 Palliative care 8/27- Pending, plan to reassess following endoscopy and stabilization of acute issues  Procedures: Right internal jugular vein CVL 8/28 >  Significant Diagnostic Tests: Chest x-ray 08/19/18-chronic left diaphragm elevation, atelectasis Chest x-ray 08/25/2018- increasing bilateral atelectasis, perihilar markings, chronic left diaphragm elevation. I reviewed the images personally.  Echo 08/02/2018-LVEF 30-35%, severe hypokinesis of mid-apicalanteroseptal, anterior, and apical myocardium.  Grade 1 diastolic dysfunction.  PA systolic pressure within normal range.  Micro Data: Blood cultures 8/21-negative MRSA PCR 8/21-negative Urine culture 8/21-multiple species  Antimicrobials:  PO. Vancomycin 8/21 >>  Resolved Hospital Problems:    Assessment & Plan:  77 year old with chronic medical problems, nursing home resident admitted with GI bleed, hemorrhagic shock.  GI bleed with acute blood loss anemia -Several loose stools on 8/28.  Positive melena.  Felt ulcerative disease Plan Continue PPI infusion N.p.o. EGD today  Cardiomyopathy. EF 30-35%, status post recent cardiac arrest HTN Plan  Holding aspirin, Lopressor, Cozaar and statin.   Would consider repeat echocardiogram at some point in the near future Decreasing maintenance IV  fluid to avoid volume overload   left basilar  atelectasis Plan Supplemental oxygen Add incentive spirometry PRN chest x-ray  Fluid and electrolyte imbalance: Mild hypernatremia and azotemia.  Suspect secondary to volume depletion Plan Follow-up chemistry this a.m.  Leukocytosis -Suspect reactive Plan Trend CBC Watch fever curve  C. difficile colitis Plan Continuing oral vancomycin therapy.  Today is day 9 while here in the hospital.  We will continue this for now   Diabetes -Glycemic control is acceptable Plan Continue sliding scale insulin   History of prostate adenocarcinoma Plan He can [facility] following discharge  LE venous ulcers Plan  Continue routine wound care as directed by wound ostomy team, his Unna boot was last changed on the 27th, this is to be changed every 5 days   Disposition / Summary of Today's Plan 08/27/18   Awaiting EGD today.  Central lines been placed, currently on his second unit of PRBCs, this makes 3 since transfer back to the intensive care.  This procedure will not be without risk, we have airway cart, as well as code cart at the ready.  Critical care will be on standby  Best Practice / Goals of Care / Disposition.   DVT prophylaxis: SCDs, Unna boots GI prophylaxis: PPI drip Diet: NPO Mobility:Bed rest Code Status: Full Code Family Communication: Daughter updated 8/28.  Labs   CBC: Recent Labs  Lab 08/22/18 0541 08/25/18 0914 08/26/18 1339 08/26/18 1643 08/27/18 0100  WBC 10.3 11.6* 14.6* 12.9* 13.1*  NEUTROABS 7.1  --  11.7*  --   --   HGB 8.0* 7.0* 6.6* 7.6* 6.7*  HCT 25.5* 23.1* 20.7* 23.5* 21.4*  MCV 89.8 91.3 89.6 89.7 90.3  PLT 230 235 215 194 756   Basic Metabolic Panel: Recent Labs  Lab 08/22/18 0541 08/25/18 0914 08/26/18 1032  NA 148* 145 147*  K 4.0 4.6 5.0  CL 112* 108 111  CO2 31 31 31   GLUCOSE 145* 156* 159*  BUN 39* 24* 33*  CREATININE 0.89 0.66 0.79  CALCIUM 8.6* 8.8* 8.4*   GFR: Estimated Creatinine Clearance: 108.9  mL/min (by C-G formula based on SCr of 0.79 mg/dL). Recent Labs  Lab 08/25/18 0914 08/26/18 1339 08/26/18 1643 08/27/18 0100  WBC 11.6* 14.6* 12.9* 13.1*   Liver Function Tests: Recent Labs  Lab 08/25/18 0914 08/26/18 1032  AST 10* 15  ALT 9 10  ALKPHOS 71 70  BILITOT 0.4 0.5  PROT 5.9* 5.6*  ALBUMIN 2.2* 2.0*   No results for input(s): LIPASE, AMYLASE in the last 168 hours. No results for input(s): AMMONIA in the last 168 hours. ABG    Component Value Date/Time   PHART 7.327 (L) 08/01/2018 0553   PCO2ART 60.5 (H) 08/01/2018 0553   PO2ART 157 (H) 08/01/2018 0553   HCO3 30.8 (H) 08/01/2018 0553   TCO2 35 (H) 08/19/2018 1527   ACIDBASEDEF 0.3 07/31/2018 1121   O2SAT 99.0 08/01/2018 0553    Coagulation Profile: Recent Labs  Lab 08/26/18 1643  INR 1.21   Cardiac Enzymes: Recent Labs  Lab 08/25/18 0914 08/25/18 1458 08/25/18 2102  TROPONINI <0.03 <0.03 <0.03   HbA1C: Hgb A1c MFr Bld  Date/Time Value Ref Range Status  05/19/2018 08:48 AM 9.3 (H) 4.8 - 5.6 % Final    Comment:    (NOTE) Pre diabetes:          5.7%-6.4% Diabetes:              >6.4% Glycemic control for   <  7.0% adults with diabetes   03/15/2017 05:51 AM 14.3 (H) 4.8 - 5.6 % Final    Comment:    (NOTE)         Pre-diabetes: 5.7 - 6.4         Diabetes: >6.4         Glycemic control for adults with diabetes: <7.0    CBG: Recent Labs  Lab 08/26/18 1610 08/26/18 1932 08/26/18 2359 08/27/18 0440 08/27/18 0759  GLUCAP 141* 128* 119* 117* [place] ACNP-BC [place] Pager # [phone] OR # [phone] if no answer

## 2018-08-27 NOTE — Brief Op Note (Signed)
08/19/2018 - 08/27/2018  11:53 AM  PATIENT:  [name]  77 y.o. male  PRE-OPERATIVE DIAGNOSIS:  EGD  POST-OPERATIVE DIAGNOSIS:  Large duodenal bulb ulcer with visible vessel  PROCEDURE:  Procedure(s) with comments: ESOPHAGOGASTRODUODENOSCOPY (EGD) WITH PROPOFOL (N/A) - bedside EGD  SURGEON:  Surgeon(s) and Role:    * Damone Fancher, MD - Primary  Findings ------------ - EGD showed large duodenal bulb ulcer with the large visible vessel and evidence of recent bleeding. It was treated with epinephrine injection and gold probe cautery. Patient had oozing of blood after gold probe cautery and subsequently Hemo spray was used with achievement of hemostasis. - patient also had a another small clean-based duodenal bulb ulcer as well as gastritis. Abscess from duodenal bulb ulcer as well as from gastric mucosa was taken. There was medium to large sized diverticulum in the fundus of stomach.  Recommendations --------------------------- - Continue Protonix drip - Monitor H&H - If evidence of ongoing bleeding or drop in hemoglobin, rrecommend interventional radiology consult for embolization of gastroduodenal artery. - discussed with critical care attending. - GI will follow  Otis Brace MD, [place] 08/27/2018, 11:56 AM  Contact #  [phone]

## 2018-08-27 NOTE — Progress Notes (Signed)
eLink Physician-Brief Progress Note Patient Name: [name] DOB: 1941-10-02 MRN: [id number]   Date of Service  08/27/2018  HPI/Events of Note  hgb 6.7  eICU Interventions  Transfuse 2 units PRBC     Intervention Category Intermediate Interventions: Bleeding - evaluation and treatment with blood products  Mauri [place], P 08/27/2018, 5:08 AM

## 2018-08-28 ENCOUNTER — Encounter (HOSPITAL_COMMUNITY): Payer: Self-pay | Admitting: Gastroenterology

## 2018-08-28 ENCOUNTER — Inpatient Hospital Stay: Payer: Self-pay

## 2018-08-28 LAB — COMPREHENSIVE METABOLIC PANEL
ALBUMIN: 2.2 g/dL — AB (ref 3.5–5.0)
ALT: 7 U/L (ref 0–44)
AST: 10 U/L — AB (ref 15–41)
Alkaline Phosphatase: 75 U/L (ref 38–126)
Anion gap: 5 (ref 5–15)
BILIRUBIN TOTAL: 0.7 mg/dL (ref 0.3–1.2)
BUN: 21 mg/dL (ref 8–23)
CALCIUM: 8.3 mg/dL — AB (ref 8.9–10.3)
CO2: 30 mmol/L (ref 22–32)
Chloride: 116 mmol/L — ABNORMAL HIGH (ref 98–111)
Creatinine, Ser: 0.82 mg/dL (ref 0.61–1.24)
GFR calc Af Amer: >60 mL/min (ref >60)
GFR calc non Af Amer: >60 mL/min (ref >60)
GLUCOSE: 114 mg/dL — AB (ref 70–99)
POTASSIUM: 3.8 mmol/L (ref 3.5–5.1)
SODIUM: 151 mmol/L — AB (ref 135–145)
TOTAL PROTEIN: 5.5 g/dL — AB (ref 6.5–8.1)

## 2018-08-28 LAB — CBC
HEMATOCRIT: 25.8 % — AB (ref 39.0–52.0)
HEMATOCRIT: 25.8 % — AB (ref 39.0–52.0)
HEMOGLOBIN: 8.2 g/dL — AB (ref 13.0–17.0)
Hemoglobin: 8.2 g/dL — ABNORMAL LOW (ref 13.0–17.0)
MCH: 29.3 pg (ref 26.0–34.0)
MCH: 29.6 pg (ref 26.0–34.0)
MCHC: 31.8 g/dL (ref 30.0–36.0)
MCHC: 31.8 g/dL (ref 30.0–36.0)
MCV: 92.1 fL (ref 78.0–100.0)
MCV: 93.1 fL (ref 78.0–100.0)
PLATELETS: 171 10*3/uL (ref 150–400)
Platelets: 168 10*3/uL (ref 150–400)
RBC: 2.8 MIL/uL — ABNORMAL LOW (ref 4.22–5.81)
RBC: 2.77 MIL/uL — ABNORMAL LOW (ref 4.22–5.81)
RDW: 18.1 % — ABNORMAL HIGH (ref 11.5–15.5)
RDW: 17.9 % — AB (ref 11.5–15.5)
WBC: 12.5 10*3/uL — AB (ref 4.0–10.5)
WBC: 11.6 10*3/uL — AB (ref 4.0–10.5)

## 2018-08-28 LAB — GLUCOSE, CAPILLARY
GLUCOSE-CAPILLARY: 83 mg/dL (ref 70–99)
GLUCOSE-CAPILLARY: 100 mg/dL — AB (ref 70–99)
Glucose-Capillary: 129 mg/dL — ABNORMAL HIGH (ref 70–99)
Glucose-Capillary: 164 mg/dL — ABNORMAL HIGH (ref 70–99)
Glucose-Capillary: 172 mg/dL — ABNORMAL HIGH (ref 70–99)
Glucose-Capillary: 97 mg/dL (ref 70–99)

## 2018-08-28 MED ORDER — PANTOPRAZOLE SODIUM 40 MG IV SOLR
40.0000 mg | Freq: Two times a day (BID) | INTRAVENOUS | Status: DC
  Administered 2018-08-29 – 2018-08-31 (×4): 40 mg via INTRAVENOUS
  Filled 2018-08-28 (×4): qty 40

## 2018-08-28 MED ORDER — DEXTROSE 5 % IV SOLN
INTRAVENOUS | Status: DC
  Administered 2018-08-28: 13:00:00 via INTRAVENOUS

## 2018-08-28 MED ORDER — CHLORHEXIDINE GLUCONATE CLOTH 2 % EX PADS: 6.0000 | MEDICATED_PAD | Freq: Every day | CUTANEOUS | Status: DC

## 2018-08-28 MED ORDER — SODIUM CHLORIDE 0.9% FLUSH
10.0000 mL | INTRAVENOUS | Status: DC | PRN
  Administered 2018-09-10: 20 mL
  Filled 2018-08-28: qty 40

## 2018-08-28 NOTE — Progress Notes (Addendum)
[name]  DEY:[id number] DOB: 1941-05-15 DOA: 08/19/2018 PCP: [place] Bing, DO    LOS: 9 days   Reason for Consult / Chief Complaint:  GI bleed  Consulting MD and date:  [name] MD- 8/28  HPI/Summary of hospital stay:  77 year old nursing home resident with recent admission for septic shock, UTI.pneumonia/soft tissue infection from 7/30-8/9. Hospital course significant for heart failure, PEA arrest for 2 mins on 8/2, Developed C. difficile colitis in nursing home after discharge.  8/21- Admitted to ICU on 8/21 with black tarry stool, shock. GI consulted but patient declined scope 8/23- Transferred from ICU to [facility] 8/27- Developed dyspnea, tachypnea, large black stool 8/28- Transfer back to ICU for ongoing bleed, hypotension. PCCM re consulted.  PPI infusion started GI consulted again 8/29: As of a.m. rounds on 8/29 has now received 3 units of PRBCs.  Hemoglobin on 8/26 down to 6.6, improved to 7.6 following transfusion but once again down to 6.7. Hemodynamically stable. Awaiting EGD.  8/30: EGD completed on 8/29 they found to duodenal ulcers which were treated with epinephrine and hemo spray to achieve hemostasis. stable overnight.  Hemoglobin stable.  Subjective:  Shortness of breath, diarrhea or abdominal pain.  Objective   Blood pressure 124/61, pulse 92, temperature 98.3 F (36.8 C), temperature source Oral, resp. rate (Abnormal) 29, height 6' (1.829 m), weight (Abnormal) 137.6 kg, SpO2 100 %. CVP:  [0 mmHg-8 mmHg] 5 mmHg      Intake/Output Summary (Last 24 hours) at 08/28/2018 0908 Last data filed at 08/28/2018 0600 Gross per 24 hour  Intake 1413.2 ml  Output 475 ml  Net 938.2 ml   Filed Weights   08/26/18 0417 08/27/18 0500 08/28/18 0500  Weight: 135.6 kg (Abnormal) 136.7 kg (Abnormal) 137.6 kg   Examination: Neuro: Is a chronically ill-appearing 77 year old African-American male.  Currently awake, oriented, no focal deficits.  He has no distress  currently. HEENT normocephalic atraumatic no jugular venous distention mucous membranes moist Pulmonary: Diminished bases no accessory use Cardiac: Regular rate and rhythm, holosystolic murmur appreciated approximately 4 out of 6. Abdomen: Soft, nontender Extremities: Dermatological changes noted.  Extremities are warm.  Both are wrapped in Unna boots. Neuro: Awake oriented no focal deficits.  He is diffusely weak GU: Voids spontaneously  Consultants: date/date of s/o (if indicated) and final recs:    GI 8/22- Signed off as pt refused scope, re consulted 8/28 Palliative care 8/27- Pending, plan to reassess following endoscopy and stabilization of acute issues.   Procedures: Right internal jugular vein CVL 8/28 >  Significant Diagnostic Tests: Chest x-ray 08/19/18-chronic left diaphragm elevation, atelectasis Chest x-ray 08/25/2018- increasing bilateral atelectasis, perihilar markings, chronic left diaphragm elevation. I reviewed the images personally.  Echo 08/02/2018-LVEF 30-35%, severe hypokinesis of mid-apicalanteroseptal, anterior, and apical myocardium.  Grade 1 diastolic dysfunction.  PA systolic pressure within normal range.  EGD completed on 8/29: Large nonbleeding diverticulum found in the gastric fundus, no evidence of active bleeding.  Diffuse mild inflammation in the gastric body and gastric antrum.  Biopsies were taken.  2 nonobstructing oozing cratered duodenal ulcers with visible vessel found at the duodenal bulb.  Biopsies were taken.  Hemostasis was achieved with epinephrine and subsequent hemo-spray.  8/29 stomach particular biopsy>>> 8/29 duodenal ulcer biopsies x2>>>  Micro Data: Blood cultures 8/21-negative MRSA PCR 8/21-negative Urine culture 8/21-multiple species C. difficile PCR positive on 8/21   antimicrobials:  PO. Vancomycin 8/21 >>  Resolved Hospital Problems:   Acute hemorrhagic shock  Assessment & Plan:    Acute GI bleed with acute blood loss  anemia in the setting of duodenal ulcers with visible blood vessels identified via EGD on 8/29, and treated with hemo-spray- Hemoglobin remains stable overnight  plan Transition to twice daily PPI at completion of 72-hour infusion Trend CBC Transfuse for hemoglobin less than 7 F/u surgical path   Cardiomyopathy. EF 30-35%, status post recent cardiac arrest HTN Plan  Holding aspirin, Lopressor, and Cozaar Probably can resume statin soon Daily assessment for diuresis Continue telemetry monitoring Needs outpatient echocardiogram follow-up  left basilar atelectasis Plan PRN chest x-ray Supplemental oxygen Incentive spirometry  Fluid and electrolyte imbalance: Mild hypernatremia & hyperchloremia.  Azotemia improved Water deficit calculates to 5 L using his current weight.  I anticipate he will be able to start clear liquids today, this should correct some of the imbalance Plan Add D5 water at 50 cc an hour Follow-up a.m. Chemistry Hold off on diuresis  Leukocytosis -Suspect reactive stable Plan Trend CBC  C. difficile colitis Plan Today is day #10 of oral vancomycin here in the hospital, it is not clear as to when this was started, given the risk of reinfection, will go ahead and complete 14-day course   Diabetes -Glycemic control is acceptable Plan Cont ssi   History of prostate adenocarcinoma Plan Follow-up with [facility] as outpatient  LE venous ulcers Plan  Continue routine wound care, last Unna boot change on August 27, changing this every 5 days   Profound debilitation and weakness/deconditioning Plan PT consult  Difficult intravenous access Plan PICC line consult  Disposition / Summary of Today's Plan 08/28/18   EGD completed.  They found bleeding duodenal ulcers which were injected with epinephrine and Hemo spray to achieve hemostasis.  Globin stable overnight.  Issues for today: Awaiting GI to advance diet, correcting free water deficit, will  ask PT to evaluate and assist with mobilization, will ask IV team to place PICC line.  Best Practice / Goals of Care / Disposition.   DVT prophylaxis: SCDs, Unna boots GI prophylaxis: PPI drip, plan to convert to twice daily after 72 hours Diet: NPO Mobility:Bed rest, out of bed Code Status: Full Code, he does need further goals of care discussion.  We will bring back palliative care to discuss this now that he is hemodynamically stable Family Communication: Daughter updated 8/28.  Labs   CBC: Recent Labs  Lab 08/22/18 0541  08/26/18 1339 08/26/18 1643 08/27/18 0100 08/27/18 1145 08/27/18 2000 08/28/18 0525  WBC 10.3   < > 14.6* 12.9* 13.1* 12.1* 12.2* 12.5*  NEUTROABS 7.1  --  11.7*  --   --   --   --   --   HGB 8.0*   < > 6.6* 7.6* 6.7* 8.1* 8.1* 8.2*  HCT 25.5*   < > 20.7* 23.5* 21.4* 25.0* 25.1* 25.8*  MCV 89.8   < > 89.6 89.7 90.3 90.3 91.3 92.1  PLT 230   < > 215 194 176 155 170 168   < > = values in this interval not displayed.   Basic Metabolic Panel: Recent Labs  Lab 08/22/18 0541 08/25/18 0914 08/26/18 1032 08/27/18 1145 08/28/18 0525  NA 148* 145 147* 150* 151*  K 4.0 4.6 5.0 4.2 3.8  CL 112* 108 111 115* 116*  CO2 31 31 31 31 30   GLUCOSE 145* 156* 159* 141* 114*  BUN 39* 24* 33* 30* 21  CREATININE 0.89 0.66 0.79 0.79 0.82  CALCIUM 8.6* 8.8* 8.4*  7.9* 8.3*   GFR: Estimated Creatinine Clearance: 106.7 mL/min (by C-G formula based on SCr of 0.82 mg/dL). Recent Labs  Lab 08/27/18 0100 08/27/18 1145 08/27/18 2000 08/28/18 0525  WBC 13.1* 12.1* 12.2* 12.5*   Liver Function Tests: Recent Labs  Lab 08/25/18 0914 08/26/18 1032 08/27/18 1145 08/28/18 0525  AST 10* 15 10* 10*  ALT 9 10 7 7   ALKPHOS 71 70 69 75  BILITOT 0.4 0.5 0.6 0.7  PROT 5.9* 5.6* 5.1* 5.5*  ALBUMIN 2.2* 2.0* 2.0* 2.2*   No results for input(s): LIPASE, AMYLASE in the last 168 hours. No results for input(s): AMMONIA in the last 168 hours. ABG    Component Value Date/Time     PHART 7.327 (L) 08/01/2018 0553   PCO2ART 60.5 (H) 08/01/2018 0553   PO2ART 157 (H) 08/01/2018 0553   HCO3 30.8 (H) 08/01/2018 0553   TCO2 35 (H) 08/19/2018 1527   ACIDBASEDEF 0.3 07/31/2018 1121   O2SAT 99.0 08/01/2018 0553    Coagulation Profile: Recent Labs  Lab 08/26/18 1643  INR 1.21   Cardiac Enzymes: Recent Labs  Lab 08/25/18 0914 08/25/18 1458 08/25/18 2102  TROPONINI <0.03 <0.03 <0.03   HbA1C: Hgb A1c MFr Bld  Date/Time Value Ref Range Status  05/19/2018 08:48 AM 9.3 (H) 4.8 - 5.6 % Final    Comment:    (NOTE) Pre diabetes:          5.7%-6.4% Diabetes:              >6.4% Glycemic control for   <7.0% adults with diabetes   03/15/2017 05:51 AM 14.3 (H) 4.8 - 5.6 % Final    Comment:    (NOTE)         Pre-diabetes: 5.7 - 6.4         Diabetes: >6.4         Glycemic control for adults with diabetes: <7.0    CBG: Recent Labs  Lab 08/27/18 1603 08/27/18 2008 08/27/18 2328 08/28/18 0324 08/28/18 [place] ACNP-BC [place] Pager # [phone] OR # [phone] if no answer

## 2018-08-28 NOTE — Progress Notes (Signed)
Peripherally Inserted Central Catheter/Midline Placement  The IV Nurse has discussed with the patient and/or persons authorized to consent for the patient, the purpose of this procedure and the potential benefits and risks involved with this procedure.  The benefits include less needle sticks, lab draws from the catheter, and the patient may be discharged home with the catheter. Risks include, but not limited to, infection, bleeding, blood clot (thrombus formation), and puncture of an artery; nerve damage and irregular heartbeat and possibility to perform a PICC exchange if needed/ordered by physician.  Alternatives to this procedure were also discussed.  Bard Power PICC patient education guide, fact sheet on infection prevention and patient information card has been provided to patient /or left at bedside.    PICC/Midline Placement Documentation  PICC Double Lumen 70/14/10 PICC Right Basilic 43 cm 0 cm (Active)  Indication for Insertion or Continuance of Line Poor Vasculature-patient has had multiple peripheral attempts or PIVs lasting less than 24 hours 08/28/2018 11:52 AM  Exposed Catheter (cm) 0 cm 08/28/2018 11:52 AM  Site Assessment Clean;Dry;Intact 08/28/2018 11:52 AM  Lumen #1 Status Flushed;Blood return noted;Saline locked 08/28/2018 11:52 AM  Lumen #2 Status Flushed;Blood return noted;Saline locked 08/28/2018 11:52 AM  Dressing Type Transparent 08/28/2018 11:52 AM  Dressing Status Clean;Intact;Dry 08/28/2018 11:52 AM  Dressing Change Due 09/04/18 08/28/2018 11:52 AM       [address] 08/28/2018, 11:54 AM

## 2018-08-28 NOTE — Progress Notes (Signed)
Physical Therapy Treatment Patient Details Name: [name] MRN: [id number] DOB: 1941-06-01 Today's Date: 08/28/2018    History of Present Illness 77 yo male who was admitted due to melena and hypotension.  He does have the significant past medical history for chronic osteomyelitis, systolic heart failure, type 2 diabetes mellitus, GERD, hiatal hernia and hypertension.  He was currently treated for C. difficile colitis, and noticed at home melanotic stools.  Recent hospitalization for sepsis (urine/ pneumonia/ soft tissue), cardiac arrest August, 2, 2019.  Pt seen in ICU 08/28/18 after Acute GI bleed with acute blood loss anemia in the setting of duodenal ulcers with visible blood vessels identified via EGD on 8/29, and treated with hemo-spray    PT Comments    Pt reports being in bed for a few days and feeling very weak.  Pt encouraged to start mobilizing as his goal is to return to modified independent transfers to his motorized w/c.  Pt assisted to/from sitting EOB.  Pt did not wish to attempt standing today since he felt so weak.  Pt's heart rate rapidly decreased upon return to supine however quickly improved with repositioning.  Pt reports he does not tolerate his HOB down since his MI.  Continue to recommend SNF.   Follow Up Recommendations  SNF     Equipment Recommendations  None recommended by PT    Recommendations for Other Services       Precautions / Restrictions Precautions Precautions: Fall Precaution Comments: monitor HR, does not tolerate laying flat    Mobility  Bed Mobility Overal bed mobility: Needs Assistance Bed Mobility: Supine to Sit;Sit to Supine     Supine to sit: Mod assist;HOB elevated;+2 for physical assistance Sit to supine: Mod assist;+2 for physical assistance   General bed mobility comments: assist for LEs, assist for trunk upright, monitored R IJ line, multiple lines, pt's HR decreased with return to supine and attempt to reposition so brought  HOB upright and improved (RN in to check on pt as well)  Transfers                    Ambulation/Gait                 Stairs             Wheelchair Mobility    Modified Rankin (Stroke Patients Only)       Balance Overall balance assessment: Needs assistance Sitting-balance support: Feet supported Sitting balance-Leahy Scale: Fair                                      Cognition Arousal/Alertness: Awake/alert Behavior During Therapy: Flat affect Overall Cognitive Status: Within Functional Limits for tasks assessed                                        Exercises General Exercises - Lower Extremity Long Arc Quad: Seated;Left;Right;10 reps(L knee limited motion) Hip Flexion/Marching: AROM;Left;Right;10 reps(L hip limited motion)    General Comments        Pertinent Vitals/Pain Pain Assessment: No/denies pain    Home Living                      Prior Function            PT Goals (  current goals can now be found in the care plan section) Progress towards PT goals: Progressing toward goals    Frequency    Min 2X/week      PT Plan Current plan remains appropriate    Co-evaluation              AM-PAC PT "6 Clicks" Daily Activity  Outcome Measure  Difficulty turning over in bed (including adjusting bedclothes, sheets and blankets)?: Unable Difficulty moving from lying on back to sitting on the side of the bed? : Unable Difficulty sitting down on and standing up from a chair with arms (e.g., wheelchair, bedside commode, etc,.)?: Unable Help needed moving to and from a bed to chair (including a wheelchair)?: Total Help needed walking in hospital room?: Total Help needed climbing 3-5 steps with a railing? : Total 6 Click Score: 6    End of Session   Activity Tolerance: Patient limited by fatigue Patient left: in bed;with call bell/phone within reach Nurse Communication: Mobility  status PT Visit Diagnosis: Muscle weakness (generalized) (M62.81)     Time: 1410-1441 PT Time Calculation (min) (ACUTE ONLY): 31 min  Charges:  $Therapeutic Activity: 23-37 mins                    Carmelia Bake, PT, DPT 08/28/2018 Pager: [phone]  [place] Ram E 08/28/2018, 4:24 PM

## 2018-08-28 NOTE — Care Management Note (Signed)
Case Management Note  Patient Details  Name: SANTHOSH GULINO MRN: [id number] Date of Birth: 1941/07/20  Subjective/Objective:                  Continues to have hemtochezia and melena   Action/Plan:  Discharge readiness is indicated by patient meeting Recovery Milestones, including ALL of the following: ? Hypotension absent      77/29 ? Surgical and other acute interventions  ? [id number]-dudodnel bleed found ? Bleeding absent or minimal continues to have bloody stools and hematochezia ac egd/ ? Stable Hgb/Hct 8.2/25.8  ? Pain absent or managed  managed ? Ambulatory  ? Oral hydration, medications, and diet ? Iv ns at kvo/ivprotonix  Discharge plans and education understood not at this time.  Expected Discharge Date:  08/22/18               Expected Discharge Plan:  Skilled Nursing Facility  In-House Referral:  Clinical Social Work  Discharge planning Services  CM Consult  Post Acute Care Choice:  NA Choice offered to:  NA  DME Arranged:  N/A DME Agency:  NA  HH Arranged:  NA HH Agency:  NA  Status of Service:  Completed, signed off  If discussed at [employer] of Stay Meetings, dates discussed:    Additional Comments:  Leeroy Cha, RN 08/28/2018, 7:59 AM

## 2018-08-28 NOTE — Progress Notes (Signed)
[facility] Gastroenterology Progress Note  [name] 77 y.o. [birth year]  CC:  GI bleed   Subjective: patient feeling better. No further bleeding episodes. Denies abdominal pain. No bowel movement since yesterday. Feeling hungry.  ROS : positive for shortness of breath. Negative for nausea and  vomiting.   Objective: Vital signs in last 24 hours: Vitals:   08/28/18 0600 08/28/18 0800  BP:    Pulse: 92   Resp: (!) 29   Temp:  98.3 F (36.8 C)  SpO2: 100%     Physical Exam:  Gen. A/O  3. Not in acute distress Heart. Ate and rhythm regular, faint murmur present Lungs. Decreased breath sounds bilaterally. Abdomen. Mildly distended, non tender, bowel sounds present, no peritoneal signs. Psych : mood and affect normal.  Lab Results: Recent Labs    08/27/18 1145 08/28/18 0525  NA 150* 151*  K 4.2 3.8  CL 115* 116*  CO2 31 30  GLUCOSE 141* 114*  BUN 30* 21  CREATININE 0.79 0.82  CALCIUM 7.9* 8.3*   Recent Labs    08/27/18 1145 08/28/18 0525  AST 10* 10*  ALT 7 7  ALKPHOS 69 75  BILITOT 0.6 0.7  PROT 5.1* 5.5*  ALBUMIN 2.0* 2.2*   Recent Labs    08/26/18 1339  08/28/18 0525 08/28/18 0930  WBC 14.6*   < > 12.5* 11.6*  NEUTROABS 11.7*  --   --   --   HGB 6.6*   < > 8.2* 8.2*  HCT 20.7*   < > 25.8* 25.8*  MCV 89.6   < > 92.1 93.1  PLT 215   < > 168 171   < > = values in this interval not displayed.   Recent Labs    08/26/18 1643  LABPROT 15.2  INR 1.21      Assessment/Plan: - melena with acute blood loss anemia. EGD 08/29 showed large duodenal bulb ulcer with large visible blood vessel.it was treated with epinephrine injection, Gold probe cautery and HemoSpray.  - Acute blood loss anemia. Hemoglobin is stable since yesterday. - respiratory distress probably combination of anemia and cardiomyopathy.improving - C. Difficile colitis. Currently on by mouth vancomycin  Recommendations ------------------------- - start full liquid diet. Slowly  advance as tolerated. - continue Protonix strip - monitor H&H - If evidence of ongoing bleeding or drop in hemoglobin, rrecommend interventional radiology consult for embolization of gastroduodenal artery. - GI will follow   Otis Brace MD, [place] 08/28/2018, 10:12 AM  Contact #  [phone]

## 2018-08-29 ENCOUNTER — Inpatient Hospital Stay (HOSPITAL_COMMUNITY): Payer: PPO

## 2018-08-29 LAB — GLUCOSE, CAPILLARY
GLUCOSE-CAPILLARY: 104 mg/dL — AB (ref 70–99)
GLUCOSE-CAPILLARY: 159 mg/dL — AB (ref 70–99)
GLUCOSE-CAPILLARY: 143 mg/dL — AB (ref 70–99)
Glucose-Capillary: 103 mg/dL — ABNORMAL HIGH (ref 70–99)
Glucose-Capillary: 151 mg/dL — ABNORMAL HIGH (ref 70–99)
Glucose-Capillary: 94 mg/dL (ref 70–99)

## 2018-08-29 LAB — BASIC METABOLIC PANEL
ANION GAP: 6 (ref 5–15)
BUN: 14 mg/dL (ref 8–23)
CO2: 29 mmol/L (ref 22–32)
Calcium: 8 mg/dL — ABNORMAL LOW (ref 8.9–10.3)
Chloride: 109 mmol/L (ref 98–111)
Creatinine, Ser: 0.8 mg/dL (ref 0.61–1.24)
GLUCOSE: 223 mg/dL — AB (ref 70–99)
POTASSIUM: 3.7 mmol/L (ref 3.5–5.1)
Sodium: 144 mmol/L (ref 135–145)

## 2018-08-29 LAB — TYPE AND SCREEN
ABO/RH(D): O POS
Antibody Screen: NEGATIVE
UNIT DIVISION: 0
UNIT DIVISION: 0
Unit division: 0
Unit division: 0
Unit division: 0
Unit division: 0

## 2018-08-29 LAB — BPAM RBC
BLOOD PRODUCT EXPIRATION DATE: 201909012359
BLOOD PRODUCT EXPIRATION DATE: 201909272359
Blood Product Expiration Date: 201909132359
Blood Product Expiration Date: 201909252359
Blood Product Expiration Date: 201909272359
Blood Product Expiration Date: 201909272359
ISSUE DATE / TIME: 201908281443
ISSUE DATE / TIME: 201908271142
ISSUE DATE / TIME: 201908290510
ISSUE DATE / TIME: 201908290747
UNIT TYPE AND RH: 5100
UNIT TYPE AND RH: 5100
UNIT TYPE AND RH: 5100
UNIT TYPE AND RH: 5100
Unit Type and Rh: 5100
Unit Type and Rh: 5100

## 2018-08-29 LAB — CBC
HEMATOCRIT: 25.7 % — AB (ref 39.0–52.0)
HEMOGLOBIN: 7.9 g/dL — AB (ref 13.0–17.0)
MCH: 29.3 pg (ref 26.0–34.0)
MCHC: 30.7 g/dL (ref 30.0–36.0)
MCV: 95.2 fL (ref 78.0–100.0)
Platelets: 151 10*3/uL (ref 150–400)
RBC: 2.7 MIL/uL — AB (ref 4.22–5.81)
RDW: 18.2 % — ABNORMAL HIGH (ref 11.5–15.5)
WBC: 11.1 10*3/uL — ABNORMAL HIGH (ref 4.0–10.5)

## 2018-08-29 LAB — PREPARE RBC (CROSSMATCH)

## 2018-08-29 MED ORDER — METOPROLOL SUCCINATE ER 25 MG PO TB24
12.5000 mg | ORAL_TABLET | Freq: Every day | ORAL | Status: DC
  Administered 2018-08-29 – 2018-09-04 (×7): 12.5 mg via ORAL
  Filled 2018-08-29 (×7): qty 1

## 2018-08-29 MED ORDER — ACETAMINOPHEN 325 MG PO TABS
650.0000 mg | ORAL_TABLET | Freq: Once | ORAL | Status: AC
  Administered 2018-08-29: 650 mg via ORAL
  Filled 2018-08-29: qty 2

## 2018-08-29 MED ORDER — FUROSEMIDE 10 MG/ML IJ SOLN
20.0000 mg | Freq: Once | INTRAMUSCULAR | Status: AC
  Administered 2018-08-29: 20 mg via INTRAVENOUS
  Filled 2018-08-29: qty 2

## 2018-08-29 MED ORDER — DIPHENHYDRAMINE HCL 25 MG PO CAPS
25.0000 mg | ORAL_CAPSULE | Freq: Once | ORAL | Status: AC
  Administered 2018-08-29: 25 mg via ORAL
  Filled 2018-08-29: qty 1

## 2018-08-29 MED ORDER — SODIUM CHLORIDE 0.9% IV SOLUTION
Freq: Once | INTRAVENOUS | Status: AC
  Administered 2018-08-29: 09:00:00 via INTRAVENOUS

## 2018-08-29 NOTE — Progress Notes (Addendum)
[facility] PROGRESS NOTE  [name] ZWC:[id number] DOB: 11/23/1941 DOA: 08/19/2018 PCP: [place] Bing, DO   Narrative:  77 year old male recent short-term resident of [facility] Recent admission by family medicine teaching service 7/30-08/07/2018 secondary to septic shock from UTI/pneumonia/soft tissue infection confounded by new sys HF heart failure AKI-had PEA cardiac arrest lasting 2 minutes 07/31/2018 -poor cardiac cath candidate  Discharge to skilled facility and apparently was as an outpatient was being treated for C. difficile colitis  Readmitted at [facility] by critical care 8/21 with black tarry stools Hypotension-on admission hemoglobin 6.7 white count 12.6 creatinine 111/2.1-also hyponatremic on admission GI was consulted-patient declined endoscopy on an additional consult to evaluate for source of bleeding transfusions this admission-he was given IV iron   Was scheduled for discharge 8/24 decompensated had acute shortness of breath as well as large dark BM-hemoglobin confirmed to be 7.0 range Subsequently transferred back to ICU service on 8/29 because of continued melena-long discussions by me with patient and family and eventually conceded to endoscopy which was performed on 8/29  Transferred back to hospitalist service 8/30 1 AM it remains somewhat tenuous in stepdown   A & Plan Hypovolemic shock secondary to large UGIB resolved--2/2 LArge GI bleed--initally refused ENdoscopy-endo done finally on ICU 8/29 as per below   Hypoxia? Vol overload?-hold IVF--has deep respirations-also has trigeminy-need close SDU monitoring-blood pressure still low normal-worry about decompnesation-nursing aware to alert me if further decline Have asked critical care Dr. Lamonte Sakai to look in on him and consensus is maybe patient has undiagnosed OHS will trial CPAP nightly as needed sleep on stepdown and monitor overnight Chest x-ray from today shows fluid in the interstices per my  read  ABLA-Anemia of renal insufficiency-iron 29, sat ratios 16-transfusing 8/31again--4th U PRBC--wonder if hypoxia is related to poor flow IV iron in 2 weeks  AKI-as above-IV saline-monitor  Recent PEA cardiac arrest-systolic heart failure EF 30%-careful with fluids-d5  50 cc/h has been d/c Now + 3 liters--keep vol even today given low BP--CVP is 9 so keep volumes even today metoprolol 12.5 twice daily on hold, torsemide 40 daily on hold, losartan 25 daily on hold at this time  Hypernatremia-sodium was 151-on d5--diet as per GI--if able, force oral fluids Labs a.m.  Chronic venous insufficiency with decubitus wounds-Unna boots on-did not review the same today  Diabetes mellitus type 2-continue sliding scale sensitive n.p.o. coverage at this time, blood sugar 139-159  C. difficile colitis-treated as an outpatient since his stay at [facility] facility-will Rx for 10 days total per GI--end date=08/30/18 and reassess   Keep on SDU, Inpatient, await resolution, no family    Verlon Au, MD  [facility] Direct contact: [phone] --Via amion app OR  --[email or web address]; password [email or web address]  7PM-7AM contact night coverage as above 08/29/2018, 10:36 AM  LOS: 10 days   Consultants:  Palliative care, GI  Procedures:  Multiple  Antimicrobials:  Oral vancomycin  Interval history/Subjective:  Awake alert coherent deep sighing inspirations otherwise does not seem to be in distress vital signs are stable No chest pain No abdominal pain Tolerating soft diet No stool no melena no diarrhea no vomiting no fever  Objective:  Vitals:  Vitals:   08/29/18 0750 08/29/18 0800  BP:  (!) 102/56  Pulse:  76  Resp:  15  Temp: (!) 97.4 F (36.3 C)   SpO2:  100%    Exam:  Awake oriented deep respirations and looks tired CTA B poor exam decreased air entry but no  rales no rhonchi No abdominal tenderness no rebound no guarding no organomegaly No lower extremity edema Pulses are  equal   I have personally reviewed the following:   Labs:  Hemoglobin 6.6-->7.9 wbc 11.1  Sodium 147-->144 BUN/creatinine 33/0.7--->14/0.8  Imaging studies:  No  Medical tests: Impression endoscopy 8/29            - No gross lesions in esophagus.            - Hematin altered blood in the entire stomach.                           - Gastric diverticulum.                           - Gastritis. Biopsied.                           - Multiple non-obstructing oozing     duodenal ulcers                            with a visible vessel. Biopsied.     Treatment not                            successful. Treatment not      successful. Treated with                            bipolar cautery.                           - Normal second portion of the     duodenum.  Test discussed with performing physician:  Yes discussed with critical care  Decision to obtain old records:  Yes  Review and summation of old records: Yes Scheduled Meds: . chlorhexidine  15 mL Mouth Rinse BID  . Chlorhexidine Gluconate Cloth  6 each Topical Daily  . furosemide  20 mg Intravenous Once  . insulin aspart  0-9 Units Subcutaneous Q4H  . mouth rinse  15 mL Mouth Rinse q12n4p  . pantoprazole (PROTONIX) IV  40 mg Intravenous Q12H  . sodium chloride flush  10-40 mL Intracatheter Q12H  . vancomycin  125 mg Oral QID   Continuous Infusions: . dextrose 50 mL/hr at 08/28/18 1240  . pantoprozole (PROTONIX) infusion 8 mg/hr (08/29/18 0527)    Active Problems:   Encounter for central line placement   Gastrointestinal hemorrhage   Pressure injury of skin   Hemorrhagic shock (HCC)   Malnutrition of moderate degree   Tachypnea   LOS: 10 days

## 2018-08-29 NOTE — Progress Notes (Signed)
Subjective: The patient was seen and examined at bedside. Reports normal bowel movements yesterday afternoon, overnight or today morning. Denies abdominal pain. He is ready for his diet to be advanced from full liquids to something more solid.  Objective: Vital signs in last 24 hours: Temp:  [97.4 F (36.3 C)-98.5 F (36.9 C)] 97.4 F (36.3 C) (08/31 0750) Pulse Rate:  [72-108] 76 (08/31 0800) Resp:  [14-29] 15 (08/31 0800) BP: (94-126)/(36-66) 102/56 (08/31 0800) SpO2:  [96 %-100 %] 100 % (08/31 0800) Weight:  [138.8 kg] 138.8 kg (08/31 0500) Weight change: 1.2 kg Last BM Date: 08/27/18  SL:HTDSKAJ short of breath, on oxygen by nasal cannula, sitting upright on bed with support,not using accessory muscles of respiration, able to speak a few words not full sentences GENERAL:overweight, prominent pallor  ABDOMEN:soft, nondistended, nontender, normoactive bowel sounds EXTREMITIES:chronic bilateral skin changes, nonpitting pedal edema  Lab Results: Results for orders placed or performed during the hospital encounter of 08/19/18 (from the past 48 hour(s))  Glucose, capillary     Status: Abnormal   Collection Time: 08/27/18 11:36 AM  Result Value Ref Range   Glucose-Capillary 121 (H) 70 - 99 mg/dL  Comprehensive metabolic panel     Status: Abnormal   Collection Time: 08/27/18 11:45 AM  Result Value Ref Range   Sodium 150 (H) 135 - 145 mmol/L   Potassium 4.2 3.5 - 5.1 mmol/L   Chloride 115 (H) 98 - 111 mmol/L   CO2 31 22 - 32 mmol/L   Glucose, Bld 141 (H) 70 - 99 mg/dL   BUN 30 (H) 8 - 23 mg/dL   Creatinine, Ser 0.79 0.61 - 1.24 mg/dL   Calcium 7.9 (L) 8.9 - 10.3 mg/dL   Total Protein 5.1 (L) 6.5 - 8.1 g/dL   Albumin 2.0 (L) 3.5 - 5.0 g/dL   AST 10 (L) 15 - 41 U/L   ALT 7 0 - 44 U/L   Alkaline Phosphatase 69 38 - 126 U/L   Total Bilirubin 0.6 0.3 - 1.2 mg/dL   GFR calc non Af Amer >60 >60 mL/min   GFR calc Af Amer >60 >60 mL/min    Comment: (NOTE) The eGFR has been  calculated using the CKD EPI equation. This calculation has not been validated in all clinical situations. eGFR's persistently <60 mL/min signify possible Chronic Kidney Disease.    Anion gap 4 (L) 5 - 15    Comment: Performed at [facility], [place] [address] [place], [place] [address]  CBC     Status: Abnormal   Collection Time: 08/27/18 11:45 AM  Result Value Ref Range   WBC 12.1 (H) 4.0 - 10.5 K/uL   RBC 2.77 (L) 4.22 - 5.81 MIL/uL   Hemoglobin 8.1 (L) 13.0 - 17.0 g/dL   HCT 25.0 (L) 39.0 - 52.0 %   MCV 90.3 78.0 - 100.0 fL   MCH 29.2 26.0 - 34.0 pg   MCHC 32.4 30.0 - 36.0 g/dL   RDW 17.3 (H) 11.5 - 15.5 %   Platelets 155 150 - 400 K/uL    Comment: Performed at [facility], [place] [address], [place], [place] [address]  Glucose, capillary     Status: None   Collection Time: 08/27/18  4:03 PM  Result Value Ref Range   Glucose-Capillary 97 70 - 99 mg/dL  CBC     Status: Abnormal   Collection Time: 08/27/18  8:00 PM  Result Value Ref Range   WBC 12.2 (H) 4.0 -  10.5 K/uL   RBC 2.75 (L) 4.22 - 5.81 MIL/uL   Hemoglobin 8.1 (L) 13.0 - 17.0 g/dL   HCT 25.1 (L) 39.0 - 52.0 %   MCV 91.3 78.0 - 100.0 fL   MCH 29.5 26.0 - 34.0 pg   MCHC 32.3 30.0 - 36.0 g/dL   RDW 17.7 (H) 11.5 - 15.5 %   Platelets 170 150 - 400 K/uL    Comment: Performed at [facility], [place] [address] [place], [place] [address]  Glucose, capillary     Status: Abnormal   Collection Time: 08/27/18  8:08 PM  Result Value Ref Range   Glucose-Capillary 103 (H) 70 - 99 mg/dL   Comment 1 Notify RN    Comment 2 Document in Chart   Glucose, capillary     Status: None   Collection Time: 08/27/18 11:28 PM  Result Value Ref Range   Glucose-Capillary 94 70 - 99 mg/dL   Comment 1 Notify RN    Comment 2 Document in Chart   Glucose, capillary     Status: None   Collection Time: 08/28/18  3:24 AM  Result Value Ref Range   Glucose-Capillary 83 70 - 99 mg/dL   Comment  1 Notify RN    Comment 2 Document in Chart   CBC     Status: Abnormal   Collection Time: 08/28/18  5:25 AM  Result Value Ref Range   WBC 12.5 (H) 4.0 - 10.5 K/uL   RBC 2.80 (L) 4.22 - 5.81 MIL/uL   Hemoglobin 8.2 (L) 13.0 - 17.0 g/dL   HCT 25.8 (L) 39.0 - 52.0 %   MCV 92.1 78.0 - 100.0 fL   MCH 29.3 26.0 - 34.0 pg   MCHC 31.8 30.0 - 36.0 g/dL   RDW 18.1 (H) 11.5 - 15.5 %   Platelets 168 150 - 400 K/uL    Comment: Performed at [facility], [place] [address], [place], [place] [address]  Comprehensive metabolic panel     Status: Abnormal   Collection Time: 08/28/18  5:25 AM  Result Value Ref Range   Sodium 151 (H) 135 - 145 mmol/L   Potassium 3.8 3.5 - 5.1 mmol/L   Chloride 116 (H) 98 - 111 mmol/L   CO2 30 22 - 32 mmol/L   Glucose, Bld 114 (H) 70 - 99 mg/dL   BUN 21 8 - 23 mg/dL   Creatinine, Ser 0.82 0.61 - 1.24 mg/dL   Calcium 8.3 (L) 8.9 - 10.3 mg/dL   Total Protein 5.5 (L) 6.5 - 8.1 g/dL   Albumin 2.2 (L) 3.5 - 5.0 g/dL   AST 10 (L) 15 - 41 U/L   ALT 7 0 - 44 U/L   Alkaline Phosphatase 75 38 - 126 U/L   Total Bilirubin 0.7 0.3 - 1.2 mg/dL   GFR calc non Af Amer >60 >60 mL/min   GFR calc Af Amer >60 >60 mL/min    Comment: (NOTE) The eGFR has been calculated using the CKD EPI equation. This calculation has not been validated in all clinical situations. eGFR's persistently <60 mL/min signify possible Chronic Kidney Disease.    Anion gap 5 5 - 15    Comment: Performed at [facility], [place] [address] [place], [place] [address]  Glucose, capillary     Status: Abnormal   Collection Time: 08/28/18  7:59 AM  Result Value Ref Range   Glucose-Capillary 100 (H) 70 - 99 mg/dL  CBC  Status: Abnormal   Collection Time: 08/28/18  9:30 AM  Result Value Ref Range   WBC 11.6 (H) 4.0 - 10.5 K/uL   RBC 2.77 (L) 4.22 - 5.81 MIL/uL   Hemoglobin 8.2 (L) 13.0 - 17.0 g/dL   HCT 25.8 (L) 39.0 - 52.0 %   MCV 93.1 78.0 - 100.0 fL   MCH 29.6 26.0 -  34.0 pg   MCHC 31.8 30.0 - 36.0 g/dL   RDW 17.9 (H) 11.5 - 15.5 %   Platelets 171 150 - 400 K/uL    Comment: Performed at [facility], [place] [address] [place], [place] [address]  Glucose, capillary     Status: None   Collection Time: 08/28/18 12:24 PM  Result Value Ref Range   Glucose-Capillary 97 70 - 99 mg/dL  Glucose, capillary     Status: Abnormal   Collection Time: 08/28/18  3:35 PM  Result Value Ref Range   Glucose-Capillary 172 (H) 70 - 99 mg/dL  Glucose, capillary     Status: Abnormal   Collection Time: 08/28/18  7:42 PM  Result Value Ref Range   Glucose-Capillary 164 (H) 70 - 99 mg/dL   Comment 1 Notify RN    Comment 2 Document in Chart   Glucose, capillary     Status: Abnormal   Collection Time: 08/28/18 11:40 PM  Result Value Ref Range   Glucose-Capillary 129 (H) 70 - 99 mg/dL   Comment 1 Notify RN    Comment 2 Document in Chart   Glucose, capillary     Status: Abnormal   Collection Time: 08/29/18  3:26 AM  Result Value Ref Range   Glucose-Capillary 103 (H) 70 - 99 mg/dL   Comment 1 Notify RN    Comment 2 Document in Chart   Basic metabolic panel     Status: Abnormal   Collection Time: 08/29/18  5:26 AM  Result Value Ref Range   Sodium 144 135 - 145 mmol/L   Potassium 3.7 3.5 - 5.1 mmol/L   Chloride 109 98 - 111 mmol/L   CO2 29 22 - 32 mmol/L   Glucose, Bld 223 (H) 70 - 99 mg/dL   BUN 14 8 - 23 mg/dL   Creatinine, Ser 0.80 0.61 - 1.24 mg/dL   Calcium 8.0 (L) 8.9 - 10.3 mg/dL   GFR calc non Af Amer >60 >60 mL/min   GFR calc Af Amer >60 >60 mL/min    Comment: (NOTE) The eGFR has been calculated using the CKD EPI equation. This calculation has not been validated in all clinical situations. eGFR's persistently <60 mL/min signify possible Chronic Kidney Disease.    Anion gap 6 5 - 15    Comment: Performed at [facility], [place] [address], [place], [place] [address]  CBC     Status: Abnormal   Collection Time: 08/29/18   5:26 AM  Result Value Ref Range   WBC 11.1 (H) 4.0 - 10.5 K/uL   RBC 2.70 (L) 4.22 - 5.81 MIL/uL   Hemoglobin 7.9 (L) 13.0 - 17.0 g/dL   HCT 25.7 (L) 39.0 - 52.0 %   MCV 95.2 78.0 - 100.0 fL   MCH 29.3 26.0 - 34.0 pg   MCHC 30.7 30.0 - 36.0 g/dL   RDW 18.2 (H) 11.5 - 15.5 %   Platelets 151 150 - 400 K/uL    Comment: Performed at [facility], [place] [address], [place], [place] [address]  Glucose, capillary  Status: Abnormal   Collection Time: 08/29/18  7:23 AM  Result Value Ref Range   Glucose-Capillary 104 (H) 70 - 99 mg/dL   Comment 1 Notify RN    Comment 2 Document in Chart   Prepare RBC     Status: None   Collection Time: 08/29/18  7:56 AM  Result Value Ref Range   Order Confirmation      ORDER PROCESSED BY BLOOD BANK Performed at [facility], [place] [address], [place], [place] [address]   Type and screen     Status: None (Preliminary result)   Collection Time: 08/29/18 10:24 AM  Result Value Ref Range   ABO/RH(D) O POS    Antibody Screen PENDING    Sample Expiration      09/01/2018 Performed at [facility], [place] [address], [place], [place] [address]     Studies/Results: [place] Ekg Site Rite  Result Date: 08/28/2018 If [facility] image not attached, placement could not be confirmed due to current cardiac rhythm.   Medications: I have reviewed the patient's current medications.  Assessment: 1.Large duodenal bulb ulcer treated with epinephrine, Gold probe cautery and hemospray Hemoglobin fairly stable at 8.1/8.1/8.2/8.2 and 7.9 today BUN trending down 33/13/21/14 Hemodynamically stable 2. C. Difficile positive-no diarrhea reported  Plan: Okay to advance diet to soft, discussed with patient's hospitalist Dr. Verlon Au, plan 1 unit PRBC transfusion as patient appears short of breath although there is no evidence of ongoing  Bleeding. Continue IV Protonix drip for now and switch to 40 mg IV every 12 hours as  ordered. On by mouth vancomycin 125 mg 4 times a day for C. Difficile, plann 10 day therapy. If there is evidence of melena/hematochezia/hemodynamic instability suggesting active ongoing bleeding-recommend IR consultation for embolization of GDA. Discussed the same with patient and verbalizes understanding.   Ronnette Juniper 08/29/2018, 11:02 AM   [phone]

## 2018-08-29 NOTE — Progress Notes (Signed)
Pt has declined use of CPAP, pt states that he doesn't use one at home and has tried to wear before in hospital but couldn't tolerate it.  RT to monitor and assess as needed.

## 2018-08-30 ENCOUNTER — Inpatient Hospital Stay (HOSPITAL_COMMUNITY): Payer: PPO

## 2018-08-30 LAB — GLUCOSE, CAPILLARY
GLUCOSE-CAPILLARY: 74 mg/dL (ref 70–99)
Glucose-Capillary: 59 mg/dL — ABNORMAL LOW (ref 70–99)
Glucose-Capillary: 86 mg/dL (ref 70–99)
Glucose-Capillary: 138 mg/dL — ABNORMAL HIGH (ref 70–99)
Glucose-Capillary: 128 mg/dL — ABNORMAL HIGH (ref 70–99)
Glucose-Capillary: 113 mg/dL — ABNORMAL HIGH (ref 70–99)
Glucose-Capillary: 96 mg/dL (ref 70–99)

## 2018-08-30 LAB — CBC WITH DIFFERENTIAL/PLATELET
BASOS ABS: 0 10*3/uL (ref 0.0–0.1)
BASOS ABS: 0 10*3/uL (ref 0.0–0.1)
BASOS PCT: 0 %
Basophils Relative: 0 %
EOS ABS: 0.8 10*3/uL — AB (ref 0.0–0.7)
EOS PCT: 7 %
Eosinophils Absolute: 0.8 10*3/uL — ABNORMAL HIGH (ref 0.0–0.7)
Eosinophils Relative: 8 %
HEMATOCRIT: 28.2 % — AB (ref 39.0–52.0)
HEMATOCRIT: 30.3 % — AB (ref 39.0–52.0)
HEMOGLOBIN: 8.8 g/dL — AB (ref 13.0–17.0)
Hemoglobin: 9.5 g/dL — ABNORMAL LOW (ref 13.0–17.0)
LYMPHS ABS: 1.1 10*3/uL (ref 0.7–4.0)
LYMPHS PCT: 9 %
Lymphocytes Relative: 13 %
Lymphs Abs: 1.5 10*3/uL (ref 0.7–4.0)
MCH: 29.5 pg (ref 26.0–34.0)
MCH: 29.4 pg (ref 26.0–34.0)
MCHC: 31.2 g/dL (ref 30.0–36.0)
MCHC: 31.4 g/dL (ref 30.0–36.0)
MCV: 94.6 fL (ref 78.0–100.0)
MCV: 93.8 fL (ref 78.0–100.0)
MONOS PCT: 6 %
Monocytes Absolute: 0.6 10*3/uL (ref 0.1–1.0)
Monocytes Absolute: 0.7 10*3/uL (ref 0.1–1.0)
Monocytes Relative: 6 %
NEUTROS ABS: 8.2 10*3/uL — AB (ref 1.7–7.7)
NEUTROS ABS: 9.6 10*3/uL — AB (ref 1.7–7.7)
NEUTROS PCT: 73 %
Neutrophils Relative %: 78 %
PLATELETS: 140 10*3/uL — AB (ref 150–400)
Platelets: 130 10*3/uL — ABNORMAL LOW (ref 150–400)
RBC: 2.98 MIL/uL — ABNORMAL LOW (ref 4.22–5.81)
RBC: 3.23 MIL/uL — AB (ref 4.22–5.81)
RDW: 17.7 % — ABNORMAL HIGH (ref 11.5–15.5)
RDW: 17.4 % — AB (ref 11.5–15.5)
WBC: 11.1 10*3/uL — ABNORMAL HIGH (ref 4.0–10.5)
WBC: 12.2 10*3/uL — AB (ref 4.0–10.5)

## 2018-08-30 LAB — COMPREHENSIVE METABOLIC PANEL
ALBUMIN: 2 g/dL — AB (ref 3.5–5.0)
ALK PHOS: 73 U/L (ref 38–126)
ALT: 8 U/L (ref 0–44)
AST: 9 U/L — ABNORMAL LOW (ref 15–41)
Anion gap: 4 — ABNORMAL LOW (ref 5–15)
BILIRUBIN TOTAL: 0.6 mg/dL (ref 0.3–1.2)
BUN: 13 mg/dL (ref 8–23)
CALCIUM: 8.1 mg/dL — AB (ref 8.9–10.3)
CO2: 31 mmol/L (ref 22–32)
CREATININE: 0.86 mg/dL (ref 0.61–1.24)
Chloride: 114 mmol/L — ABNORMAL HIGH (ref 98–111)
GFR calc Af Amer: >60 mL/min (ref >60)
GFR calc non Af Amer: >60 mL/min (ref >60)
Glucose, Bld: 90 mg/dL (ref 70–99)
Potassium: 3.9 mmol/L (ref 3.5–5.1)
Sodium: 149 mmol/L — ABNORMAL HIGH (ref 135–145)
Total Protein: 5.3 g/dL — ABNORMAL LOW (ref 6.5–8.1)

## 2018-08-30 MED ORDER — FUROSEMIDE 10 MG/ML IJ SOLN
40.0000 mg | Freq: Once | INTRAMUSCULAR | Status: AC
  Administered 2018-08-30: 40 mg via INTRAVENOUS
  Filled 2018-08-30: qty 4

## 2018-08-30 MED ORDER — DEXTROSE 50 % IV SOLN
INTRAVENOUS | Status: AC
  Administered 2018-08-30: 50 mL
  Filled 2018-08-30: qty 50

## 2018-08-30 MED ORDER — DEXTROSE 5 % IV SOLN
INTRAVENOUS | Status: DC
  Administered 2018-08-30 – 2018-09-01 (×4): via INTRAVENOUS

## 2018-08-30 NOTE — Progress Notes (Signed)
[facility] PROGRESS NOTE  [name] CVE:[id number] DOB: July 19, 1941 DOA: 08/19/2018 PCP: [place] Bing, DO   Narrative:  34 aam recent short-term resident of [facility] Recent admit family medicine teaching service 7/30-08/07/2018 septic shock from UTI/pneumonia/soft tissue infection  confounded by new sys HF heart failure AKI-had PEA cardiac arrest lasting 2 minutes 07/31/2018 -poor cardiac cath candidate  D/c skilled facility and apparently was as an outpatient was being treated for C. difficile colitis at SNF--unclear when Rx started  Readmitted at [facility] by critical care 8/21 with black tarry stools Hypotension-on admission hemoglobin 6.7 white count 12.6 creatinine 111/2.1-also hyponatremic on admission GI was consulted-patient declined initial endoscopy to evaluate for source of bleeding transfusions this admission-he was given IV iron   scheduled for discharge 8/24-decompensated had acute shortness of breath as well as large dark BM-hemoglobin confirmed to be 7.0 range Subsequently transferred back to ICU service on 8/29 because of continued melena-long discussions by me with patient and eventually conceded to endoscopy which was performed on 8/29  Transferred back to hospitalist service 8/31 AM it  remains somewhat tenuous in stepdown--GOals of care sought and pending   A & Plan Hypovolemic shock secondary to large UGIB resolved--2/2 Large GI bleed--initally refused Endoscopy-endo done finally on ICU 8/29 as per below   ABLA-Anemia of renal insufficiency-iron 29, sat ratios 16-transfusing 8/31again--4th U PRBC--?hypoxia is related to ABLA IV iron in 2 weeks Re-check labs in am-seems to be stable currently and has not had any further dark or tarry stools  Hypoxia? Vol overload?-hold IVF-- deep respirations-also has trigeminy-need close SDU monitoring-blood pressure still low normal-worry about decompensation Chest x-ray 8/31 shows some fluid declining use of  Bipap as could not tolerate Get CVP to guide therapy see below  Trigeminy-reimplemented low-dose metoprolol 12.5 daily XL  AKI-as above-IV saline-monitor  Hypernatremia-2/2 poor po intake, inability to keep down free water-restart IV D5 9/1 for the same-see bellow  Recent PEA cardiac arrest-systolic heart failure EF 30%- + 3 liters torsemide 40 daily on hold, losartan 25 daily on hold at this time Giving IV lasix 9/1 in addition to Saline for natriuretic effect-recheck labs a.m.  Diabetes mellitus type 2-continue sliding scale sensitive n.p.o. coverage at this time, blood sugar 59--143--starting D5--q 4 checks  Chronic venous insufficiency with decubitus wounds-Unna boots on-wounds reviewwed  Lower extremity elephantiasis and 2 x 3 x 1 cm deep plantar heel wound on the left leg with some purulence Wound nurse to see and may be performed some dissection if possible-Unna boots off until that time-does not appear overly infected at this time so hold antibiotics as had just finished a course of the same recently  C. difficile colitis-treated as an outpatient since his stay at [facility] facility-will Rx for 10 days total per GI--end date=08/30/18--watch fever curve and clinical condition and may need to reimplement if needed   Keep on SDU, Inpatient, await resolution, no family present at this time-poor overall prognosis-we will get palliative care not emergently involved-with his lower extremity wounds, recent PEA arrest and large bleed he is at high risk for further decompensation and rehospitalization and palliative care eventually can talk about this in the forthcoming days    Verlon Au, MD  [facility] Direct contact: [phone] --Via amion app OR  --[email or web address]; password [email or web address]  7PM-7AM contact night coverage as above 08/30/2018, 7:30 AM  LOS: 11 days   Consultants:  Palliative care, GI  Procedures:  Multiple  Antimicrobials:  Oral vancomycin  Interval  history/Subjective:  Awake alert coherent does not seem to have deep sighing respirations any further and looks much better than prior Eating breakfast at the bedside meal of eggs bacon grits and fruit Nursing reports no new issues  Objective:  Vitals:  Vitals:   08/30/18 0600 08/30/18 0700  BP: (!) 105/43 94/77  Pulse: 79 80  Resp: (!) 22 (!) 22  Temp:    SpO2: 98% 97%    Exam:  Awake pleasant oriented mild tachycardia No pallor no icterus Chest relatively clear Abdomen soft Wounds examined at the bedside as above in my plan   I have personally reviewed the following:   Labs:  Hemoglobin 6.6--> 8.8, WBC 11.1  Sodium 147-->144-->149 currently  BUN/creatinine 33/0.7--->14/0.8-->13/0.8  Imaging studies:  No  Medical tests: Impression endoscopy 8/29            - No gross lesions in esophagus.            - Hematin altered blood in the entire stomach.                           - Gastric diverticulum.                           - Gastritis. Biopsied.                           - Multiple non-obstructing oozing     duodenal ulcers                            with a visible vessel. Biopsied.     Treatment not                            successful. Treatment not      successful. Treated with                            bipolar cautery.                           - Normal second portion of the     duodenum.  Test discussed with performing physician:  Yes discussed with critical care  Decision to obtain old records:  Yes  Review and summation of old records: Yes Scheduled Meds: . chlorhexidine  15 mL Mouth Rinse BID  . Chlorhexidine Gluconate Cloth  6 each Topical Daily  . furosemide  40 mg Intravenous Once  . insulin aspart  0-9 Units Subcutaneous Q4H  . mouth rinse  15 mL Mouth Rinse q12n4p  . metoprolol succinate  12.5 mg Oral Daily  . pantoprazole (PROTONIX) IV  40 mg Intravenous Q12H  . sodium chloride flush  10-40 mL Intracatheter Q12H   Continuous  Infusions:   Active Problems:   Encounter for central line placement   Gastrointestinal hemorrhage   Pressure injury of skin   Hemorrhagic shock (HCC)   Malnutrition of moderate degree   Tachypnea   LOS: 11 days

## 2018-08-30 NOTE — Progress Notes (Signed)
Subjective: The patient was seen and examined at bedside. He still appears short of breath and is able to speak only in few words not full sentences. Today morning he complains of his abdomen feeling tight. Has not had a bowel movement since having the endoscopy on 08/27/18. He denies feeling nauseous or vomiting. He received 1 unit of PRBC transfusion yesterday.  Objective: Vital signs in last 24 hours: Temp:  [97.2 F (36.2 C)-98.8 F (37.1 C)] 98.6 F (37 C) (09/01 0800) Pulse Rate:  [59-92] 85 (09/01 0900) Resp:  [13-29] 23 (09/01 0900) BP: (94-164)/(43-84) 164/83 (09/01 0900) SpO2:  [94 %-100 %] 95 % (09/01 0900) Weight:  [141.8 kg] 141.8 kg (09/01 0921) Weight change:  Last BM Date: 08/27/18  SW:HQPRF, short of breath, on oxygen by nasal cannula GENERAL:sitting upright in bed with support  ABDOMEN:soft, non-distended, hypoactive bowel sounds EXTREMITIES:chronic skin changes with bilateral leg swelling  Lab Results: Results for orders placed or performed during the hospital encounter of 08/19/18 (from the past 48 hour(s))  Glucose, capillary     Status: Abnormal   Collection Time: 08/28/18  3:35 PM  Result Value Ref Range   Glucose-Capillary 172 (H) 70 - 99 mg/dL  Glucose, capillary     Status: Abnormal   Collection Time: 08/28/18  7:42 PM  Result Value Ref Range   Glucose-Capillary 164 (H) 70 - 99 mg/dL   Comment 1 Notify RN    Comment 2 Document in Chart   Glucose, capillary     Status: Abnormal   Collection Time: 08/28/18 11:40 PM  Result Value Ref Range   Glucose-Capillary 129 (H) 70 - 99 mg/dL   Comment 1 Notify RN    Comment 2 Document in Chart   Glucose, capillary     Status: Abnormal   Collection Time: 08/29/18  3:26 AM  Result Value Ref Range   Glucose-Capillary 103 (H) 70 - 99 mg/dL   Comment 1 Notify RN    Comment 2 Document in Chart   Basic metabolic panel     Status: Abnormal   Collection Time: 08/29/18  5:26 AM  Result Value Ref Range   Sodium 144  135 - 145 mmol/L   Potassium 3.7 3.5 - 5.1 mmol/L   Chloride 109 98 - 111 mmol/L   CO2 29 22 - 32 mmol/L   Glucose, Bld 223 (H) 70 - 99 mg/dL   BUN 14 8 - 23 mg/dL   Creatinine, Ser 0.80 0.61 - 1.24 mg/dL   Calcium 8.0 (L) 8.9 - 10.3 mg/dL   GFR calc non Af Amer >60 >60 mL/min   GFR calc Af Amer >60 >60 mL/min    Comment: (NOTE) The eGFR has been calculated using the CKD EPI equation. This calculation has not been validated in all clinical situations. eGFR's persistently <60 mL/min signify possible Chronic Kidney Disease.    Anion gap 6 5 - 15    Comment: Performed at [facility], [place] [address], [place], [place] [address]  CBC     Status: Abnormal   Collection Time: 08/29/18  5:26 AM  Result Value Ref Range   WBC 11.1 (H) 4.0 - 10.5 K/uL   RBC 2.70 (L) 4.22 - 5.81 MIL/uL   Hemoglobin 7.9 (L) 13.0 - 17.0 g/dL   HCT 25.7 (L) 39.0 - 52.0 %   MCV 95.2 78.0 - 100.0 fL   MCH 29.3 26.0 - 34.0 pg   MCHC 30.7 30.0 - 36.0 g/dL   RDW 18.2 (  H) 11.5 - 15.5 %   Platelets 151 150 - 400 K/uL    Comment: Performed at [facility], [place] [address] [place], [place] [address]  Glucose, capillary     Status: Abnormal   Collection Time: 08/29/18  7:23 AM  Result Value Ref Range   Glucose-Capillary 104 (H) 70 - 99 mg/dL   Comment 1 Notify RN    Comment 2 Document in Chart   Prepare RBC     Status: None   Collection Time: 08/29/18  7:56 AM  Result Value Ref Range   Order Confirmation      ORDER PROCESSED BY BLOOD BANK Performed at [facility] [address], [place], [place] [address]   Type and screen     Status: None (Preliminary result)   Collection Time: 08/29/18 10:24 AM  Result Value Ref Range   ABO/RH(D) O POS    Antibody Screen NEG    Sample Expiration 09/01/2018    Unit Number [id number]    Blood Component Type RED CELLS,LR    Unit division 00    Status of Unit ISSUED    Transfusion Status OK TO TRANSFUSE     Crossmatch Result      Compatible Performed at [facility] [address], [place], [place] [address]   Glucose, capillary     Status: Abnormal   Collection Time: 08/29/18 11:25 AM  Result Value Ref Range   Glucose-Capillary 151 (H) 70 - 99 mg/dL   Comment 1 Notify RN    Comment 2 Document in Chart   Glucose, capillary     Status: None   Collection Time: 08/29/18  3:30 PM  Result Value Ref Range   Glucose-Capillary 94 70 - 99 mg/dL  Glucose, capillary     Status: Abnormal   Collection Time: 08/29/18  7:32 PM  Result Value Ref Range   Glucose-Capillary 159 (H) 70 - 99 mg/dL   Comment 1 Notify RN    Comment 2 Document in Chart   Glucose, capillary     Status: Abnormal   Collection Time: 08/29/18 11:12 PM  Result Value Ref Range   Glucose-Capillary 143 (H) 70 - 99 mg/dL   Comment 1 Notify RN    Comment 2 Document in Chart   Glucose, capillary     Status: Abnormal   Collection Time: 08/30/18  3:23 AM  Result Value Ref Range   Glucose-Capillary 59 (L) 70 - 99 mg/dL   Comment 1 Notify RN    Comment 2 Document in Chart   Glucose, capillary     Status: None   Collection Time: 08/30/18  4:21 AM  Result Value Ref Range   Glucose-Capillary 86 70 - 99 mg/dL  CBC with Differential/Platelet     Status: Abnormal   Collection Time: 08/30/18  4:30 AM  Result Value Ref Range   WBC 11.1 (H) 4.0 - 10.5 K/uL   RBC 2.98 (L) 4.22 - 5.81 MIL/uL   Hemoglobin 8.8 (L) 13.0 - 17.0 g/dL   HCT 28.2 (L) 39.0 - 52.0 %   MCV 94.6 78.0 - 100.0 fL   MCH 29.5 26.0 - 34.0 pg   MCHC 31.2 30.0 - 36.0 g/dL   RDW 17.7 (H) 11.5 - 15.5 %   Platelets 130 (L) 150 - 400 K/uL   Neutrophils Relative % 73 %   Neutro Abs 8.2 (H) 1.7 - 7.7 K/uL   Lymphocytes Relative 13 %   Lymphs Abs 1.5 0.7 -  4.0 K/uL   Monocytes Relative 6 %   Monocytes Absolute 0.6 0.1 - 1.0 K/uL   Eosinophils Relative 8 %   Eosinophils Absolute 0.8 (H) 0.0 - 0.7 K/uL   Basophils Relative 0 %   Basophils Absolute 0.0  0.0 - 0.1 K/uL    Comment: Performed at [facility], [place] [address] [place], [place] [address]  Comprehensive metabolic panel     Status: Abnormal   Collection Time: 08/30/18  4:30 AM  Result Value Ref Range   Sodium 149 (H) 135 - 145 mmol/L   Potassium 3.9 3.5 - 5.1 mmol/L   Chloride 114 (H) 98 - 111 mmol/L   CO2 31 22 - 32 mmol/L   Glucose, Bld 90 70 - 99 mg/dL   BUN 13 8 - 23 mg/dL   Creatinine, Ser 0.86 0.61 - 1.24 mg/dL   Calcium 8.1 (L) 8.9 - 10.3 mg/dL   Total Protein 5.3 (L) 6.5 - 8.1 g/dL   Albumin 2.0 (L) 3.5 - 5.0 g/dL   AST 9 (L) 15 - 41 U/L   ALT 8 0 - 44 U/L   Alkaline Phosphatase 73 38 - 126 U/L   Total Bilirubin 0.6 0.3 - 1.2 mg/dL   GFR calc non Af Amer >60 >60 mL/min   GFR calc Af Amer >60 >60 mL/min    Comment: (NOTE) The eGFR has been calculated using the CKD EPI equation. This calculation has not been validated in all clinical situations. eGFR's persistently <60 mL/min signify possible Chronic Kidney Disease.    Anion gap 4 (L) 5 - 15    Comment: Performed at [facility], [place] [address], [place], [place] [address]  Glucose, capillary     Status: None   Collection Time: 08/30/18  7:40 AM  Result Value Ref Range   Glucose-Capillary 74 70 - 99 mg/dL  Glucose, capillary     Status: Abnormal   Collection Time: 08/30/18 11:42 AM  Result Value Ref Range   Glucose-Capillary 138 (H) 70 - 99 mg/dL   Comment 1 Notify RN    Comment 2 Document in Chart     Studies/Results: Dg Chest Port 1 View  Result Date: 08/29/2018 CLINICAL DATA:  77 year old with history of C difficile colitis, presenting with acute shortness of breath. EXAM: PORTABLE CHEST 1 VIEW COMPARISON:  08/26/2018, 08/25/2018 and earlier, including CTA chest 06/04/2018 and earlier. FINDINGS: RIGHT arm PICC tip projects at or near the cavoatrial junction. Interval RIGHT jugular central venous catheter removal. Cardiac silhouette upper normal in size to slightly  enlarged, unchanged. Thoracic aorta mildly atherosclerotic, unchanged. Consolidation involving the LEFT LOWER LOBE, unchanged since May, 2019, associated with mild elevation of the LEFT hemidiaphragm. No new pulmonary parenchymal abnormalities. Note is made of severe degenerative changes in both shoulder joints. IMPRESSION: 1. Chronic LEFT LOWER LOBE atelectasis, unchanged dating back to May, 2019, associated with mild elevation of the LEFT hemidiaphragm. 2. No new/acute cardiopulmonary disease. 3. RIGHT arm PICC tip at or near the expected location of the cavoatrial junction. Electronically Signed   By: Evangeline Dakin M.D.   On: 08/29/2018 11:19    Medications: I have reviewed the patient's current medications.  Assessment: 1.acute blood loss anemia secondary to large duodenal ulcer with a visible vessel treated with epinephrine, cauterization and hemo spray. Hemoglobin 8.8 after 1 unit PRBC transfusion yesterday,on 7.9 BUN trending down, reassuring.  2. History of C. Difficile infection-has finished 10 day course of by mouth vancomycin Has not  had a bowel movement in 3 days No worsening leukocytosis noted  3.multiple comorbidities-recent PE A, cardiac arrest, systolic heart failure, diabetes, chronic venous insufficiency's with decubitus wound   Plan: Abdominal x-ray to be performed at bedside. On Protonix 40 mg IV every 12 hours. Continue supportive management, H&H monitoring every 24 hours and transfusion if needed. If there is any evidence of active of further bleeding recommend IR guided embolization of GDA.     Ronnette Juniper 08/30/2018, 12:28 PM   Pager [phone] If no answer or after 5 PM call [phone]

## 2018-08-30 NOTE — Progress Notes (Signed)
Pt has refused CPAP for the night, machine remains ready at bedside.  RT to monitor and assess as needed.

## 2018-08-31 LAB — TYPE AND SCREEN
ABO/RH(D): O POS
Antibody Screen: NEGATIVE
UNIT DIVISION: 0

## 2018-08-31 LAB — CBC WITH DIFFERENTIAL/PLATELET
Basophils Absolute: 0 10*3/uL (ref 0.0–0.1)
Basophils Relative: 0 %
EOS ABS: 0.7 10*3/uL (ref 0.0–0.7)
Eosinophils Relative: 6 %
HEMATOCRIT: 27.4 % — AB (ref 39.0–52.0)
HEMOGLOBIN: 8.6 g/dL — AB (ref 13.0–17.0)
LYMPHS ABS: 1.7 10*3/uL (ref 0.7–4.0)
LYMPHS PCT: 16 %
MCH: 29.5 pg (ref 26.0–34.0)
MCHC: 31.4 g/dL (ref 30.0–36.0)
MCV: 93.8 fL (ref 78.0–100.0)
MONOS PCT: 7 %
Monocytes Absolute: 0.8 10*3/uL (ref 0.1–1.0)
NEUTROS PCT: 71 %
Neutro Abs: 7.3 10*3/uL (ref 1.7–7.7)
Platelets: 135 10*3/uL — ABNORMAL LOW (ref 150–400)
RBC: 2.92 MIL/uL — ABNORMAL LOW (ref 4.22–5.81)
RDW: 17.2 % — ABNORMAL HIGH (ref 11.5–15.5)
WBC: 10.4 10*3/uL (ref 4.0–10.5)

## 2018-08-31 LAB — BPAM RBC
Blood Product Expiration Date: 201910022359
ISSUE DATE / TIME: 201908311156
Unit Type and Rh: 5100

## 2018-08-31 LAB — BASIC METABOLIC PANEL
Anion gap: 4 — ABNORMAL LOW (ref 5–15)
BUN: 13 mg/dL (ref 8–23)
CHLORIDE: 98 mmol/L (ref 98–111)
CO2: 32 mmol/L (ref 22–32)
CREATININE: 0.85 mg/dL (ref 0.61–1.24)
Calcium: 7.6 mg/dL — ABNORMAL LOW (ref 8.9–10.3)
GFR calc Af Amer: >60 mL/min (ref >60)
GFR calc non Af Amer: >60 mL/min (ref >60)
Glucose, Bld: 405 mg/dL — ABNORMAL HIGH (ref 70–99)
POTASSIUM: 3.7 mmol/L (ref 3.5–5.1)
SODIUM: 134 mmol/L — AB (ref 135–145)

## 2018-08-31 LAB — GLUCOSE, CAPILLARY
GLUCOSE-CAPILLARY: 130 mg/dL — AB (ref 70–99)
GLUCOSE-CAPILLARY: 154 mg/dL — AB (ref 70–99)
Glucose-Capillary: 102 mg/dL — ABNORMAL HIGH (ref 70–99)
Glucose-Capillary: 127 mg/dL — ABNORMAL HIGH (ref 70–99)
Glucose-Capillary: 97 mg/dL (ref 70–99)

## 2018-08-31 LAB — HEMOGLOBIN AND HEMATOCRIT, BLOOD
HCT: 27.9 % — ABNORMAL LOW (ref 39.0–52.0)
Hemoglobin: 8.7 g/dL — ABNORMAL LOW (ref 13.0–17.0)

## 2018-08-31 MED ORDER — CHLORHEXIDINE GLUCONATE CLOTH 2 % EX PADS
6.0000 | MEDICATED_PAD | Freq: Every day | CUTANEOUS | Status: AC
  Administered 2018-09-04 – 2018-09-05 (×2): 6 via TOPICAL

## 2018-08-31 MED ORDER — TAMSULOSIN HCL 0.4 MG PO CAPS
0.8000 mg | ORAL_CAPSULE | Freq: Every day | ORAL | Status: DC
  Administered 2018-08-31 – 2018-09-04 (×5): 0.8 mg via ORAL
  Filled 2018-08-31 (×5): qty 2

## 2018-08-31 MED ORDER — FLUTICASONE PROPIONATE 50 MCG/ACT NA SUSP
2.0000 | Freq: Every day | NASAL | Status: DC
  Administered 2018-08-31 – 2018-09-03 (×4): 2 via NASAL
  Filled 2018-08-31: qty 16

## 2018-08-31 MED ORDER — MUPIROCIN 2 % EX OINT: 1.0000 "application " | TOPICAL_OINTMENT | Freq: Two times a day (BID) | CUTANEOUS | Status: DC

## 2018-08-31 MED ORDER — PANTOPRAZOLE SODIUM 40 MG PO TBEC
40.0000 mg | DELAYED_RELEASE_TABLET | Freq: Two times a day (BID) | ORAL | Status: DC
  Administered 2018-08-31 – 2018-09-04 (×9): 40 mg via ORAL
  Filled 2018-08-31 (×9): qty 1

## 2018-08-31 MED ORDER — LATANOPROST 0.005 % OP SOLN
1.0000 [drp] | Freq: Every day | OPHTHALMIC | Status: DC
  Administered 2018-08-31 – 2018-09-30 (×29): 1 [drp] via OPHTHALMIC
  Filled 2018-08-31 (×4): qty 2.5

## 2018-08-31 MED ORDER — PRAVASTATIN SODIUM 20 MG PO TABS
40.0000 mg | ORAL_TABLET | Freq: Every day | ORAL | Status: DC
  Administered 2018-08-31 – 2018-09-04 (×5): 40 mg via ORAL
  Filled 2018-08-31 (×2): qty 2
  Filled 2018-08-31: qty 1
  Filled 2018-08-31: qty 2
  Filled 2018-08-31: qty 1

## 2018-08-31 NOTE — Progress Notes (Signed)
Subjective: The patient was seen and examined at bedside. He continues to feel short of breath and complains of generalized weakness. He reports having a small formed bowel movement yesterday. He continues to have right and left lower quadrant discomfort and states he is not passing as much flatness. He has been able to tolerate solid diet. No further episodes of melena hematochezia reported.  Objective: Vital signs in last 24 hours: Temp:  [98.1 F (36.7 C)-99.1 F (37.3 C)] 98.1 F (36.7 C) (09/02 0748) Pulse Rate:  [72-79] 77 (09/02 0400) Resp:  [18-26] 26 (09/02 0400) BP: (106-135)/(44-78) 111/78 (09/02 0400) SpO2:  [96 %-100 %] 100 % (09/02 0400) Weight:  [141.3 kg] 141.3 kg (09/02 0354) Weight change:  Last BM Date: 08/27/18  ZH:YQMVHQI up on bed, oxygen via nasal cannula GENERAL:appears short of breath, not using accessory muscles of respiration but unable to speak in full sentences and speaks in a few words at a time ABDOMEN:nondistended, mild lower quadrant tenderness, hypoactive bowel sounds EXTREMITIES:chronic skin changes with edema  Lab Results: Results for orders placed or performed during the hospital encounter of 08/19/18 (from the past 48 hour(s))  Type and screen     Status: None   Collection Time: 08/29/18 10:24 AM  Result Value Ref Range   ABO/RH(D) O POS    Antibody Screen NEG    Sample Expiration 09/01/2018    Unit Number [id number]    Blood Component Type RED CELLS,LR    Unit division 00    Status of Unit ISSUED,FINAL    Transfusion Status OK TO TRANSFUSE    Crossmatch Result      Compatible Performed at [facility], [place] [address] [place], [place] [address]   Glucose, capillary     Status: Abnormal   Collection Time: 08/29/18 11:25 AM  Result Value Ref Range   Glucose-Capillary 151 (H) 70 - 99 mg/dL   Comment 1 Notify RN    Comment 2 Document in Chart   Glucose, capillary     Status: None   Collection Time: 08/29/18  3:30  PM  Result Value Ref Range   Glucose-Capillary 94 70 - 99 mg/dL  Glucose, capillary     Status: Abnormal   Collection Time: 08/29/18  7:32 PM  Result Value Ref Range   Glucose-Capillary 159 (H) 70 - 99 mg/dL   Comment 1 Notify RN    Comment 2 Document in Chart   Glucose, capillary     Status: Abnormal   Collection Time: 08/29/18 11:12 PM  Result Value Ref Range   Glucose-Capillary 143 (H) 70 - 99 mg/dL   Comment 1 Notify RN    Comment 2 Document in Chart   Glucose, capillary     Status: Abnormal   Collection Time: 08/30/18  3:23 AM  Result Value Ref Range   Glucose-Capillary 59 (L) 70 - 99 mg/dL   Comment 1 Notify RN    Comment 2 Document in Chart   Glucose, capillary     Status: None   Collection Time: 08/30/18  4:21 AM  Result Value Ref Range   Glucose-Capillary 86 70 - 99 mg/dL  CBC with Differential/Platelet     Status: Abnormal   Collection Time: 08/30/18  4:30 AM  Result Value Ref Range   WBC 11.1 (H) 4.0 - 10.5 K/uL   RBC 2.98 (L) 4.22 - 5.81 MIL/uL   Hemoglobin 8.8 (L) 13.0 - 17.0 g/dL   HCT 28.2 (L) 39.0 - 52.0 %  MCV 94.6 78.0 - 100.0 fL   MCH 29.5 26.0 - 34.0 pg   MCHC 31.2 30.0 - 36.0 g/dL   RDW 17.7 (H) 11.5 - 15.5 %   Platelets 130 (L) 150 - 400 K/uL   Neutrophils Relative % 73 %   Neutro Abs 8.2 (H) 1.7 - 7.7 K/uL   Lymphocytes Relative 13 %   Lymphs Abs 1.5 0.7 - 4.0 K/uL   Monocytes Relative 6 %   Monocytes Absolute 0.6 0.1 - 1.0 K/uL   Eosinophils Relative 8 %   Eosinophils Absolute 0.8 (H) 0.0 - 0.7 K/uL   Basophils Relative 0 %   Basophils Absolute 0.0 0.0 - 0.1 K/uL    Comment: Performed at [facility], [place] [address] [place], [place] [address]  Comprehensive metabolic panel     Status: Abnormal   Collection Time: 08/30/18  4:30 AM  Result Value Ref Range   Sodium 149 (H) 135 - 145 mmol/L   Potassium 3.9 3.5 - 5.1 mmol/L   Chloride 114 (H) 98 - 111 mmol/L   CO2 31 22 - 32 mmol/L   Glucose, Bld 90 70 - 99 mg/dL   BUN 13  8 - 23 mg/dL   Creatinine, Ser 0.86 0.61 - 1.24 mg/dL   Calcium 8.1 (L) 8.9 - 10.3 mg/dL   Total Protein 5.3 (L) 6.5 - 8.1 g/dL   Albumin 2.0 (L) 3.5 - 5.0 g/dL   AST 9 (L) 15 - 41 U/L   ALT 8 0 - 44 U/L   Alkaline Phosphatase 73 38 - 126 U/L   Total Bilirubin 0.6 0.3 - 1.2 mg/dL   GFR calc non Af Amer >60 >60 mL/min   GFR calc Af Amer >60 >60 mL/min    Comment: (NOTE) The eGFR has been calculated using the CKD EPI equation. This calculation has not been validated in all clinical situations. eGFR's persistently <60 mL/min signify possible Chronic Kidney Disease.    Anion gap 4 (L) 5 - 15    Comment: Performed at [facility], [place] [address], [place], [place] [address]  Glucose, capillary     Status: None   Collection Time: 08/30/18  7:40 AM  Result Value Ref Range   Glucose-Capillary 74 70 - 99 mg/dL  Glucose, capillary     Status: Abnormal   Collection Time: 08/30/18 11:42 AM  Result Value Ref Range   Glucose-Capillary 138 (H) 70 - 99 mg/dL   Comment 1 Notify RN    Comment 2 Document in Chart   CBC with Differential/Platelet     Status: Abnormal   Collection Time: 08/30/18  2:09 PM  Result Value Ref Range   WBC 12.2 (H) 4.0 - 10.5 K/uL   RBC 3.23 (L) 4.22 - 5.81 MIL/uL   Hemoglobin 9.5 (L) 13.0 - 17.0 g/dL   HCT 30.3 (L) 39.0 - 52.0 %   MCV 93.8 78.0 - 100.0 fL   MCH 29.4 26.0 - 34.0 pg   MCHC 31.4 30.0 - 36.0 g/dL   RDW 17.4 (H) 11.5 - 15.5 %   Platelets 140 (L) 150 - 400 K/uL   Neutrophils Relative % 78 %   Neutro Abs 9.6 (H) 1.7 - 7.7 K/uL   Lymphocytes Relative 9 %   Lymphs Abs 1.1 0.7 - 4.0 K/uL   Monocytes Relative 6 %   Monocytes Absolute 0.7 0.1 - 1.0 K/uL   Eosinophils Relative 7 %   Eosinophils Absolute 0.8 (H) 0.0 - 0.7  K/uL   Basophils Relative 0 %   Basophils Absolute 0.0 0.0 - 0.1 K/uL    Comment: Performed at [facility], [place] [address] [place], [place] [address]  Glucose, capillary     Status: Abnormal    Collection Time: 08/30/18  4:28 PM  Result Value Ref Range   Glucose-Capillary 128 (H) 70 - 99 mg/dL  Glucose, capillary     Status: Abnormal   Collection Time: 08/30/18  7:56 PM  Result Value Ref Range   Glucose-Capillary 113 (H) 70 - 99 mg/dL   Comment 1 Notify RN    Comment 2 Document in Chart   Glucose, capillary     Status: None   Collection Time: 08/30/18 11:29 PM  Result Value Ref Range   Glucose-Capillary 96 70 - 99 mg/dL   Comment 1 Notify RN    Comment 2 Document in Chart   Glucose, capillary     Status: None   Collection Time: 08/31/18  3:23 AM  Result Value Ref Range   Glucose-Capillary 97 70 - 99 mg/dL   Comment 1 Notify RN    Comment 2 Document in Chart   CBC with Differential/Platelet     Status: Abnormal   Collection Time: 08/31/18  3:48 AM  Result Value Ref Range   WBC 10.4 4.0 - 10.5 K/uL   RBC 2.92 (L) 4.22 - 5.81 MIL/uL   Hemoglobin 8.6 (L) 13.0 - 17.0 g/dL   HCT 27.4 (L) 39.0 - 52.0 %   MCV 93.8 78.0 - 100.0 fL   MCH 29.5 26.0 - 34.0 pg   MCHC 31.4 30.0 - 36.0 g/dL   RDW 17.2 (H) 11.5 - 15.5 %   Platelets 135 (L) 150 - 400 K/uL   Neutrophils Relative % 71 %   Neutro Abs 7.3 1.7 - 7.7 K/uL   Lymphocytes Relative 16 %   Lymphs Abs 1.7 0.7 - 4.0 K/uL   Monocytes Relative 7 %   Monocytes Absolute 0.8 0.1 - 1.0 K/uL   Eosinophils Relative 6 %   Eosinophils Absolute 0.7 0.0 - 0.7 K/uL   Basophils Relative 0 %   Basophils Absolute 0.0 0.0 - 0.1 K/uL    Comment: Performed at [facility], [place] [address] [place], [place] [address]  Basic metabolic panel     Status: Abnormal   Collection Time: 08/31/18  3:48 AM  Result Value Ref Range   Sodium 134 (L) 135 - 145 mmol/L    Comment: DELTA CHECK NOTED REPEATED TO VERIFY    Potassium 3.7 3.5 - 5.1 mmol/L   Chloride 98 98 - 111 mmol/L   CO2 32 22 - 32 mmol/L   Glucose, Bld 405 (H) 70 - 99 mg/dL   BUN 13 8 - 23 mg/dL   Creatinine, Ser 0.85 0.61 - 1.24 mg/dL   Calcium 7.6 (L) 8.9 - 10.3  mg/dL   GFR calc non Af Amer >60 >60 mL/min   GFR calc Af Amer >60 >60 mL/min    Comment: (NOTE) The eGFR has been calculated using the CKD EPI equation. This calculation has not been validated in all clinical situations. eGFR's persistently <60 mL/min signify possible Chronic Kidney Disease.    Anion gap 4 (L) 5 - 15    Comment: Performed at [facility], [place] [address] [place], [place] [address]  Glucose, capillary     Status: Abnormal   Collection Time: 08/31/18  7:27 AM  Result Value Ref Range  Glucose-Capillary 102 (H) 70 - 99 mg/dL    Studies/Results: Dg Abd 1 View  Result Date: 08/30/2018 CLINICAL DATA:  Pt c/o abdominal pain. Duodenal ulcer. H/o Diabetes type 2, prostate cancer, hiatal hernia. EXAM: ABDOMEN - 1 VIEW COMPARISON:  07/31/2018 FINDINGS: There is elevation of LEFT hemidiaphragm. There is gaseous distension of the stomach. There is increased opacity at the LEFT lung base, consistent with atelectasis or consolidation and pleural effusion. No bowel obstruction. Remote posttraumatic diastases of the symphysis pubis. Bilateral hip replacements. Degenerative changes are seen in the spine. IMPRESSION: 1. Developing density at the LEFT lung base. 2. Gaseous distension of the stomach. 3. Nonobstructed bowel-gas pattern. Electronically Signed   By: Nolon Nations M.D.   On: 08/30/2018 13:36   Dg Chest Port 1 View  Result Date: 08/29/2018 CLINICAL DATA:  77 year old with history of C difficile colitis, presenting with acute shortness of breath. EXAM: PORTABLE CHEST 1 VIEW COMPARISON:  08/26/2018, 08/25/2018 and earlier, including CTA chest 06/04/2018 and earlier. FINDINGS: RIGHT arm PICC tip projects at or near the cavoatrial junction. Interval RIGHT jugular central venous catheter removal. Cardiac silhouette upper normal in size to slightly enlarged, unchanged. Thoracic aorta mildly atherosclerotic, unchanged. Consolidation involving the LEFT LOWER LOBE,  unchanged since May, 2019, associated with mild elevation of the LEFT hemidiaphragm. No new pulmonary parenchymal abnormalities. Note is made of severe degenerative changes in both shoulder joints. IMPRESSION: 1. Chronic LEFT LOWER LOBE atelectasis, unchanged dating back to May, 2019, associated with mild elevation of the LEFT hemidiaphragm. 2. No new/acute cardiopulmonary disease. 3. RIGHT arm PICC tip at or near the expected location of the cavoatrial junction. Electronically Signed   By: Evangeline Dakin M.D.   On: 08/29/2018 11:19    Medications: I have reviewed the patient's current medications.  Assessment: 1.Large duodenal ulcer treated with epinephrine, cauterization and hemo spray. So far has received 8 units PRBC since 08/19/18 1 unit PRBC transfusion on 08/29/18 Prior to that received 2 units PRBC on 08/27/2018, 1 unit PRBC on 08/26/18, 1 unit PRBC on 08/25/18, 1 unit PRBC on 08/20/18, 2 units PRBC on 08/19/18.  Hemoglobin increased from 7.9-8.8 and then 9.5 with 1 unit PRBC transfusion, is stable at 8.6 today  2.History of C. Difficile diarrhea, as completed 10 day course of by mouth vancomycin, bowel movement was formed and small yesterday Abdominal x-ray showed gaseous distention of stomach with nonobstructed bowel gas pattern  3.Multiple comorbidities-chronic systolic and diastolic heart failure,PEA  4.Chronic left lower lobe atelectasis with mild elevation of left hemidiaphragm noted on chest x-ray from 08/29/18  Plan: Appears stable from GI standpoint. No further episodes of drop in hemoglobin, melena or hematochezia reported. Diarrhea from C. Difficile seems to have resolved. May discontinue enteric precautions. Continue pantoprazole 40 mg twice a day for another 2 months, consider repeat EGD in 2 months if general condition permits. If there is evidence of active bleeding such as melena, hematochezia, hemodynamic instability, will need IR guided consultation for embolization of  GDA.  GI will sign off, please reconsult if needed    Ronnette Juniper 08/31/2018, 10:09 AM   Pager [phone] If no answer or after 5 PM call [phone]

## 2018-08-31 NOTE — Progress Notes (Signed)
PT Cancellation Note  Patient Details Name: [name] MRN: [id number] DOB: 03-Dec-1941   Cancelled Treatment:    Reason Eval/Treat Not Completed: Patient declined,patient states that he is having stomach issues and feels like there is an empty place. Declined exercises and  Declined sitting up.  Continue PPT as patient participates.  Claretha Cooper 08/31/2018, 8:52 AM  Tresa Endo PT [phone]

## 2018-08-31 NOTE — Care Management Note (Signed)
Case Management Note  Patient Details  Name: WARD BOISSONNEAULT MRN: [id number] Date of Birth: 1941/12/30  Subjective/Objective:                  Subjective: The patient was seen and examined at bedside. He still appears short of breath and is able to speak only in few words not full sentences. Today morning he complains of his abdomen feeling tight. Has not had a bowel movement since having the endoscopy on 08/27/18. He denies feeling nauseous or vomiting. He received 1 unit of PRBC transfusion yesterday.  Objective: Vital signs in last 24 hours: Temp:  [97.2 F (36.2 C)-98.8 F (37.1 C)] 98.6 F (37 C) (09/01 0800) Pulse Rate:  [59-92] 85 (09/01 0900) Resp:  [13-29] 23 (09/01 0900) BP: (94-164)/(43-84) 164/83 (09/01 0900) SpO2:  [94 %-100 %] 95 % (09/01 0900) Weight:  [141.8 kg] 141.8 kg (09/01 0921) Weight change:  Last BM Date: 08/27/18  MH:WKGSU, short of breath, on oxygen by nasal cannula GENERAL:sitting upright in bed with support       ABDOMEN:soft, non-distended, hypoactive bowel sounds EXTREMITIES:chronic skin changes with bilateral leg swelling   Action/Plan: Following for progression Following for cm needs  Expected Discharge Date:  08/22/18               Expected Discharge Plan:  [place]  In-House Referral:  Clinical Social Work  Discharge planning Services  CM Consult  Post Acute Care Choice:  NA Choice offered to:  NA  DME Arranged:  N/A DME Agency:  NA  HH Arranged:  NA HH Agency:  NA  Status of Service:  Completed, signed off  If discussed at [employer], dates discussed:    Additional Comments:  Leeroy Cha, RN 08/31/2018, 9:23 AM

## 2018-08-31 NOTE — Progress Notes (Signed)
PROGRESS NOTE    [name]  MOQ:[id number] DOB: 1941/04/02 DOA: 08/19/2018 PCP: [place] Bing, DO    Brief Narrative: 77 year old man with past medical history relevant for recent diagnosis of chronic systolic and diastolic heart failure (echo on 08/02/2018 showed EF of 30 to 35% with wall motion of normality, grade 1 diastolic dysfunction), recent treatment for C. difficile colitis and nursing home, hyperlipidemia, BPH who was admitted from nursing home with black tarry stools and hypotension and initially was pending discharge without endoscopy and acutely became hypoxic who was transferred to ICU on 08/27/2018 with persistent melena and had EGD on 08/27/2018 that showed large duodenal bulb ulcer with visible vessel status post epi injection and gold probe cautery.    Assessment & Plan:   Active Problems:   Encounter for central line placement   Gastrointestinal hemorrhage   Pressure injury of skin   Hemorrhagic shock (HCC)   Malnutrition of moderate degree   Tachypnea   #) Upper GI bleed: Secondary to bleeding vessel at duodenal bulb ulcer.  That is post iron infusion -GI consulted, appreciate recommendations -Status post EGD on 08/27/2018 with intervention - Transition to p.o. Protonix 40 mg twice daily -If patient continues to have bleeding then per GI would recommend interventional radiology consult for gastroduodenal artery intervention -Frequent H&H checks  #) PEA arrest, gated by chronic systolic and diastolic heart failure: Appears to be well compensated at this time.   -continue metoprolol succinate 12.5 mg daily - Hold losartan 25 mg daily -Discontinue aspirin 81 mg - Restart torsemide 40 mg daily  #) Recent diagnosis of C. difficile: This was diagnosed at nursing home.  No current diarrhea -Patient is status post oral vancomycin ended on 08/27/2018  #) Hyperlipidemia: -Restart home pravastatin 40 mg daily  #) BPH: -Restart home tamsulosin 0.8 mg  nightly  Fluids: Tolerating p.o. Elect lites: Monitor and supplement Nutrition: Soft diet  Prophylaxis: SCDs  Disposition: Pending stabilization of hemoglobin  Full code   Consultants:   Gastroenterology  Procedures:   EGD 08/27/2018: - EGD showed large duodenal bulb ulcer with the large visible vessel and evidence of recent bleeding. It was treated with epinephrine injection and gold probe cautery. Patient had oozing of blood after gold probe cautery and subsequently Hemo spray was used with achievement of hemostasis. - patient also had a another small clean-based duodenal bulb ulcer as well as gastritis. Abscess from duodenal bulb ulcer as well as from gastric mucosa was taken. There was medium to large sized diverticulum in the fundus of stomach.  Antimicrobials:  Oral vancomycin completed 08/27/2018   Subjective: Patient reports she is doing fairly well.  He has not had a bowel movement in 3 days.  He put some diffuse abdominal pain but denies any nausea, vomiting, diarrhea, cough, congestion, rhinorrhea.  Objective: Vitals:   08/31/18 0334 08/31/18 0354 08/31/18 0400 08/31/18 0748  BP:   111/78   Pulse:   77   Resp:   (!) 26   Temp: 98.2 F (36.8 C)   98.1 F (36.7 C)  TempSrc: Oral   Oral  SpO2:   100%   Weight:  (!) 141.3 kg    Height:        Intake/Output Summary (Last 24 hours) at 08/31/2018 0948 Last data filed at 08/31/2018 0600 Gross per 24 hour  Intake 536.82 ml  Output 450 ml  Net 86.82 ml   Filed Weights   08/29/18 0500 08/30/18 0921 08/31/18 0354  Weight: Marland Kitchen)  138.8 kg (!) 141.8 kg (!) 141.3 kg    Examination:  General exam: Appears calm and comfortable  Respiratory system: No increased work of breathing, diminished lung sounds at bases, no wheezes, crackles, rhonchi. Cardiovascular system: Distant heart sounds, regular rate and rhythm, no murmurs Gastrointestinal system: Abdomen is nondistended, soft mildly tender to deep palpation, diminished  bowel sounds, no rebound or guarding. Central nervous system: Alert and oriented. No focal neurological deficits. Extremities: Trace lower extremity edema Skin: No rashes visible skin Psychiatry: Judgement and insight appear normal. Mood & affect appropriate.     Data Reviewed: I have personally reviewed following labs and imaging studies  CBC: Recent Labs  Lab 08/26/18 1339  08/28/18 0930 08/29/18 0526 08/30/18 0430 08/30/18 1409 08/31/18 0348  WBC 14.6*   < > 11.6* 11.1* 11.1* 12.2* 10.4  NEUTROABS 11.7*  --   --   --  8.2* 9.6* 7.3  HGB 6.6*   < > 8.2* 7.9* 8.8* 9.5* 8.6*  HCT 20.7*   < > 25.8* 25.7* 28.2* 30.3* 27.4*  MCV 89.6   < > 93.1 95.2 94.6 93.8 93.8  PLT 215   < > 171 151 130* 140* 135*   < > = values in this interval not displayed.   Basic Metabolic Panel: Recent Labs  Lab 08/27/18 1145 08/28/18 0525 08/29/18 0526 08/30/18 0430 08/31/18 0348  NA 150* 151* 144 149* 134*  K 4.2 3.8 3.7 3.9 3.7  CL 115* 116* 109 114* 98  CO2 31 30 29 31  32  GLUCOSE 141* 114* 223* 90 405*  BUN 30* 21 14 13 13   CREATININE 0.79 0.82 0.80 0.86 0.85  CALCIUM 7.9* 8.3* 8.0* 8.1* 7.6*   GFR: Estimated Creatinine Clearance: 104.4 mL/min (by C-G formula based on SCr of 0.85 mg/dL). Liver Function Tests: Recent Labs  Lab 08/25/18 0914 08/26/18 1032 08/27/18 1145 08/28/18 0525 08/30/18 0430  AST 10* 15 10* 10* 9*  ALT 9 10 7 7 8   ALKPHOS 71 70 69 75 73  BILITOT 0.4 0.5 0.6 0.7 0.6  PROT 5.9* 5.6* 5.1* 5.5* 5.3*  ALBUMIN 2.2* 2.0* 2.0* 2.2* 2.0*   No results for input(s): LIPASE, AMYLASE in the last 168 hours. No results for input(s): AMMONIA in the last 168 hours. Coagulation Profile: Recent Labs  Lab 08/26/18 1643  INR 1.21   Cardiac Enzymes: Recent Labs  Lab 08/25/18 0914 08/25/18 1458 08/25/18 2102  TROPONINI <0.03 <0.03 <0.03   BNP (last 3 results) No results for input(s): PROBNP in the last 8760 hours. HbA1C: No results for input(s): HGBA1C in the  last 72 hours. CBG: Recent Labs  Lab 08/30/18 1628 08/30/18 1956 08/30/18 2329 08/31/18 0323 08/31/18 0727  GLUCAP 128* 113* 96 97 102*   Lipid Profile: No results for input(s): CHOL, HDL, LDLCALC, TRIG, CHOLHDL, LDLDIRECT in the last 72 hours. Thyroid Function Tests: No results for input(s): TSH, T4TOTAL, FREET4, T3FREE, THYROIDAB in the last 72 hours. Anemia Panel: No results for input(s): VITAMINB12, FOLATE, FERRITIN, TIBC, IRON, RETICCTPCT in the last 72 hours. Sepsis Labs: No results for input(s): PROCALCITON, LATICACIDVEN in the last 168 hours.  No results found for this or any previous visit (from the past 240 hour(s)).       Radiology Studies: Dg Abd 1 View  Result Date: 08/30/2018 CLINICAL DATA:  Pt c/o abdominal pain. Duodenal ulcer. H/o Diabetes type 2, prostate cancer, hiatal hernia. EXAM: ABDOMEN - 1 VIEW COMPARISON:  07/31/2018 FINDINGS: There is elevation of LEFT hemidiaphragm. There  is gaseous distension of the stomach. There is increased opacity at the LEFT lung base, consistent with atelectasis or consolidation and pleural effusion. No bowel obstruction. Remote posttraumatic diastases of the symphysis pubis. Bilateral hip replacements. Degenerative changes are seen in the spine. IMPRESSION: 1. Developing density at the LEFT lung base. 2. Gaseous distension of the stomach. 3. Nonobstructed bowel-gas pattern. Electronically Signed   By: Nolon Nations M.D.   On: 08/30/2018 13:36   Dg Chest Port 1 View  Result Date: 08/29/2018 CLINICAL DATA:  77 year old with history of C difficile colitis, presenting with acute shortness of breath. EXAM: PORTABLE CHEST 1 VIEW COMPARISON:  08/26/2018, 08/25/2018 and earlier, including CTA chest 06/04/2018 and earlier. FINDINGS: RIGHT arm PICC tip projects at or near the cavoatrial junction. Interval RIGHT jugular central venous catheter removal. Cardiac silhouette upper normal in size to slightly enlarged, unchanged. Thoracic aorta  mildly atherosclerotic, unchanged. Consolidation involving the LEFT LOWER LOBE, unchanged since May, 2019, associated with mild elevation of the LEFT hemidiaphragm. No new pulmonary parenchymal abnormalities. Note is made of severe degenerative changes in both shoulder joints. IMPRESSION: 1. Chronic LEFT LOWER LOBE atelectasis, unchanged dating back to May, 2019, associated with mild elevation of the LEFT hemidiaphragm. 2. No new/acute cardiopulmonary disease. 3. RIGHT arm PICC tip at or near the expected location of the cavoatrial junction. Electronically Signed   By: Evangeline Dakin M.D.   On: 08/29/2018 11:19        Scheduled Meds: . chlorhexidine  15 mL Mouth Rinse BID  . Chlorhexidine Gluconate Cloth  6 each Topical Daily  . insulin aspart  0-9 Units Subcutaneous Q4H  . mouth rinse  15 mL Mouth Rinse q12n4p  . metoprolol succinate  12.5 mg Oral Daily  . pantoprazole (PROTONIX) IV  40 mg Intravenous Q12H  . sodium chloride flush  10-40 mL Intracatheter Q12H   Continuous Infusions: . dextrose 50 mL/hr at 08/31/18 0600     LOS: 12 days    Time spent: [place], MD [facility]  If 7PM-7AM, please contact night-coverage [email or web address] Password [facility] 08/31/2018, 9:48 AM

## 2018-09-01 ENCOUNTER — Other Ambulatory Visit: Payer: Self-pay | Admitting: *Deleted

## 2018-09-01 ENCOUNTER — Inpatient Hospital Stay (HOSPITAL_COMMUNITY): Payer: PPO

## 2018-09-01 LAB — GLUCOSE, CAPILLARY
GLUCOSE-CAPILLARY: 119 mg/dL — AB (ref 70–99)
GLUCOSE-CAPILLARY: 91 mg/dL (ref 70–99)
GLUCOSE-CAPILLARY: 105 mg/dL — AB (ref 70–99)
GLUCOSE-CAPILLARY: 118 mg/dL — AB (ref 70–99)
GLUCOSE-CAPILLARY: 140 mg/dL — AB (ref 70–99)
Glucose-Capillary: 97 mg/dL (ref 70–99)
Glucose-Capillary: 153 mg/dL — ABNORMAL HIGH (ref 70–99)

## 2018-09-01 LAB — BLOOD GAS, VENOUS
Acid-Base Excess: 5.3 mmol/L — ABNORMAL HIGH (ref 0.0–2.0)
Bicarbonate: 32.1 mmol/L — ABNORMAL HIGH (ref 20.0–28.0)
O2 Saturation: 62.4 %
Patient temperature: 98.6
pCO2, Ven: 64.5 mmHg — ABNORMAL HIGH (ref 44.0–60.0)
pH, Ven: 7.318 (ref 7.250–7.430)
pO2, Ven: 34.3 mmHg (ref 32.0–45.0)

## 2018-09-01 LAB — COMPREHENSIVE METABOLIC PANEL
AST: 10 U/L — ABNORMAL LOW (ref 15–41)
Albumin: 2 g/dL — ABNORMAL LOW (ref 3.5–5.0)
Alkaline Phosphatase: 84 U/L (ref 38–126)
Anion gap: 5 (ref 5–15)
BUN: 11 mg/dL (ref 8–23)
CO2: 31 mmol/L (ref 22–32)
Calcium: 8.1 mg/dL — ABNORMAL LOW (ref 8.9–10.3)
Glucose, Bld: 107 mg/dL — ABNORMAL HIGH (ref 70–99)
Sodium: 140 mmol/L (ref 135–145)

## 2018-09-01 LAB — CBC
HCT: 28.6 % — ABNORMAL LOW (ref 39.0–52.0)
Hemoglobin: 8.7 g/dL — ABNORMAL LOW (ref 13.0–17.0)
MCH: 28.8 pg (ref 26.0–34.0)
MCHC: 30.4 g/dL (ref 30.0–36.0)
MCV: 94.7 fL (ref 78.0–100.0)
Platelets: 142 K/uL — ABNORMAL LOW (ref 150–400)
RBC: 3.02 MIL/uL — ABNORMAL LOW (ref 4.22–5.81)
RDW: 16.7 % — ABNORMAL HIGH (ref 11.5–15.5)
WBC: 10.4 K/uL (ref 4.0–10.5)

## 2018-09-01 LAB — COMPREHENSIVE METABOLIC PANEL WITH GFR
ALT: 7 U/L (ref 0–44)
Chloride: 104 mmol/L (ref 98–111)
Creatinine, Ser: 0.71 mg/dL (ref 0.61–1.24)
GFR calc Af Amer: >60 mL/min (ref >60)
GFR calc non Af Amer: >60 mL/min (ref >60)
Potassium: 4.1 mmol/L (ref 3.5–5.1)
Total Bilirubin: 0.5 mg/dL (ref 0.3–1.2)
Total Protein: 5.6 g/dL — ABNORMAL LOW (ref 6.5–8.1)

## 2018-09-01 LAB — PROCALCITONIN: Procalcitonin: <0.1 ng/mL

## 2018-09-01 LAB — MAGNESIUM: Magnesium: 1.7 mg/dL (ref 1.7–2.4)

## 2018-09-01 MED ORDER — TORSEMIDE 20 MG PO TABS
40.0000 mg | ORAL_TABLET | Freq: Every day | ORAL | Status: DC
  Administered 2018-09-01 – 2018-09-04 (×4): 40 mg via ORAL
  Filled 2018-09-01 (×4): qty 2

## 2018-09-01 NOTE — Consult Note (Signed)
   [facility] CM Inpatient Consult   09/01/2018  [name] [birth year] [id number]   [facility] Care Management follow up.   [name] remains in stepdown unit. Noted palliative consult pending. Will continue follow for disposition plans and progression. Will refer to [facility] team as appropriate.   Marthenia Rolling, MSN-Ed, RN,BSN [facility] Liaison [phone]

## 2018-09-01 NOTE — Progress Notes (Signed)
PT Cancellation Note  Patient Details Name: BERTEL VENARD MRN: [id number] DOB: 03/31/1941   Cancelled Treatment:    Reason Eval/Treat Not Completed: Medical issues which prohibited therapy, per RN, difficulty breathing. Chest xray pending. /   Marcelino Freestone PT [phone]  09/01/2018, 8:29 AM

## 2018-09-01 NOTE — Progress Notes (Signed)
PROGRESS NOTE    [name]  JZP:[id number] DOB: Dec 15, 1941 DOA: 08/19/2018 PCP: [place] Bing, DO    Brief Narrative: 77 year old man with past medical history relevant for recent diagnosis of chronic systolic and diastolic heart failure (echo on 08/02/2018 showed EF of 30 to 35% with wall motion of normality, grade 1 diastolic dysfunction), recent treatment for C. difficile colitis and nursing home, hyperlipidemia, BPH who was admitted from nursing home with black tarry stools and hypotension and initially was pending discharge without endoscopy and acutely became hypoxic who was transferred to ICU on 08/27/2018 with persistent melena and had EGD on 08/27/2018 that showed large duodenal bulb ulcer with visible vessel status post epi injection and gold probe cautery.    Assessment & Plan:   Active Problems:   Encounter for central line placement   Gastrointestinal hemorrhage   Pressure injury of skin   Hemorrhagic shock (HCC)   Malnutrition of moderate degree   Tachypnea  #) Shortness of breath/hypoxia: Patient continues to have episodes of shortness of breath and hypoxia.  At this time the chest x-ray shows no evidence of any infiltrate or fluid overload however does show some atelectasis versus infiltrate on the left base. - ABG pending -Chest x-ray -Have restarted diuretics per below -We will check procalcitonin  #) Upper GI bleed: Secondary to bleeding vessel at duodenal bulb ulcer.  Status post iron transfusion.If patient continues to have bleeding then per GI would recommend interventional radiology consult for gastroduodenal artery intervention -GI consulted, appreciate recommendations they have signed off -Status post EGD on 08/27/2018 with intervention - Continue p.o. Protonix 40 mg twice daily  #) PEA arrest, gated by chronic systolic and diastolic heart failure: Appears to be well compensated at this time.   -continue metoprolol succinate 12.5 mg daily - Hold  losartan 25 mg daily -Discontinue aspirin 81 mg - Continue torsemide 40 mg daily  #) Recent diagnosis of C. difficile: This was diagnosed at nursing home.  No current diarrhea -Patient is status post oral vancomycin ended on 08/27/2018  #) Hyperlipidemia: -Continue home pravastatin 40 mg daily  #) BPH: -Continue home tamsulosin 0.8 mg nightly  Fluids: Tolerating p.o. Elect lites: Monitor and supplement Nutrition: Soft diet  Prophylaxis: SCDs  Disposition: Pending improved shortness of breath  Full code   Consultants:   Gastroenterology  Procedures:   EGD 08/27/2018: - EGD showed large duodenal bulb ulcer with the large visible vessel and evidence of recent bleeding. It was treated with epinephrine injection and gold probe cautery. Patient had oozing of blood after gold probe cautery and subsequently Hemo spray was used with achievement of hemostasis. - patient also had a another small clean-based duodenal bulb ulcer as well as gastritis. Abscess from duodenal bulb ulcer as well as from gastric mucosa was taken. There was medium to large sized diverticulum in the fundus of stomach.  Antimicrobials:  Oral vancomycin completed 08/27/2018   Subjective: Patient reports he is doing fairly well currently.  He does not have much in the way of abdominal pain.  He continues to report some shortness of breath and is tachypneic.  He did require some oxygen as well.  Objective: Vitals:   09/01/18 0400 09/01/18 0415 09/01/18 0600 09/01/18 0800  BP:   107/72   Pulse:   72   Resp:   20   Temp: 97.7 F (36.5 C)   (!) 97.5 F (36.4 C)  TempSrc: Axillary     SpO2:   (!) 82%  Weight:  (!) 142 kg    Height:        Intake/Output Summary (Last 24 hours) at 09/01/2018 0955 Last data filed at 09/01/2018 0300 Gross per 24 hour  Intake 1275.34 ml  Output 500 ml  Net 775.34 ml   Filed Weights   08/30/18 0921 08/31/18 0354 09/01/18 0415  Weight: (!) 141.8 kg (!) 141.3 kg (!) 142 kg     Examination:  General exam: Appears calm and comfortable  Respiratory system: Mildly increased work of breathing, diminished lung sounds at bases, no wheezes, crackles, rhonchi. Cardiovascular system: Distant heart sounds, regular rate and rhythm, no murmurs Gastrointestinal system: Abdomen is nondistended, soft no tenderness to deep palpation, diminished bowel sounds, no rebound or guarding. Central nervous system: Alert and oriented. No focal neurological deficits. Extremities: Trace lower extremity edema Skin: No rashes visible skin Psychiatry: Judgement and insight appear normal. Mood & affect appropriate.     Data Reviewed: I have personally reviewed following labs and imaging studies  CBC: Recent Labs  Lab 08/26/18 1339  08/29/18 0526 08/30/18 0430 08/30/18 1409 08/31/18 0348 08/31/18 1600 09/01/18 0458  WBC 14.6*   < > 11.1* 11.1* 12.2* 10.4  --  10.4  NEUTROABS 11.7*  --   --  8.2* 9.6* 7.3  --   --   HGB 6.6*   < > 7.9* 8.8* 9.5* 8.6* 8.7* 8.7*  HCT 20.7*   < > 25.7* 28.2* 30.3* 27.4* 27.9* 28.6*  MCV 89.6   < > 95.2 94.6 93.8 93.8  --  94.7  PLT 215   < > 151 130* 140* 135*  --  142*   < > = values in this interval not displayed.   Basic Metabolic Panel: Recent Labs  Lab 08/28/18 0525 08/29/18 0526 08/30/18 0430 08/31/18 0348 09/01/18 0458  NA 151* 144 149* 134* 140  K 3.8 3.7 3.9 3.7 4.1  CL 116* 109 114* 98 104  CO2 30 29 31  32 31  GLUCOSE 114* 223* 90 405* 107*  BUN 21 14 13 13 11   CREATININE 0.82 0.80 0.86 0.85 0.71  CALCIUM 8.3* 8.0* 8.1* 7.6* 8.1*  MG  --   --   --   --  1.7   GFR: Estimated Creatinine Clearance: 111.3 mL/min (by C-G formula based on SCr of 0.71 mg/dL). Liver Function Tests: Recent Labs  Lab 08/26/18 1032 08/27/18 1145 08/28/18 0525 08/30/18 0430 09/01/18 0458  AST 15 10* 10* 9* 10*  ALT 10 7 7 8 7   ALKPHOS 70 69 75 73 84  BILITOT 0.5 0.6 0.7 0.6 0.5  PROT 5.6* 5.1* 5.5* 5.3* 5.6*  ALBUMIN 2.0* 2.0* 2.2* 2.0*  2.0*   No results for input(s): LIPASE, AMYLASE in the last 168 hours. No results for input(s): AMMONIA in the last 168 hours. Coagulation Profile: Recent Labs  Lab 08/26/18 1643  INR 1.21   Cardiac Enzymes: Recent Labs  Lab 08/25/18 1458 08/25/18 2102  TROPONINI <0.03 <0.03   BNP (last 3 results) No results for input(s): PROBNP in the last 8760 hours. HbA1C: No results for input(s): HGBA1C in the last 72 hours. CBG: Recent Labs  Lab 08/31/18 1523 08/31/18 1938 08/31/18 2340 09/01/18 0400 09/01/18 0829  GLUCAP 130* 154* 127* 91 97   Lipid Profile: No results for input(s): CHOL, HDL, LDLCALC, TRIG, CHOLHDL, LDLDIRECT in the last 72 hours. Thyroid Function Tests: No results for input(s): TSH, T4TOTAL, FREET4, T3FREE, THYROIDAB in the last 72 hours. Anemia Panel: No results for  input(s): VITAMINB12, FOLATE, FERRITIN, TIBC, IRON, RETICCTPCT in the last 72 hours. Sepsis Labs: No results for input(s): PROCALCITON, LATICACIDVEN in the last 168 hours.  No results found for this or any previous visit (from the past 240 hour(s)).       Radiology Studies: Dg Chest 1 View  Result Date: 09/01/2018 CLINICAL DATA:  Shortness of breath. Diabetes and hypertension. Personal history of prostate carcinoma. EXAM: CHEST  1 VIEW COMPARISON:  08/29/2018 FINDINGS: Right arm PICC line remains in appropriate position. Heart size is stable. Mild elevation of left hemidiaphragm again noted. There is persistent atelectasis or infiltrate in the left lung base. Mild increase in atelectasis noted in right lung base. No pneumothorax visualized. IMPRESSION: Left lower lobe atelectasis versus infiltrate, without significant change. Increased mild right basilar atelectasis. Electronically Signed   By: Earle Gell M.D.   On: 09/01/2018 08:40   Dg Abd 1 View  Result Date: 08/30/2018 CLINICAL DATA:  Pt c/o abdominal pain. Duodenal ulcer. H/o Diabetes type 2, prostate cancer, hiatal hernia. EXAM: ABDOMEN -  1 VIEW COMPARISON:  07/31/2018 FINDINGS: There is elevation of LEFT hemidiaphragm. There is gaseous distension of the stomach. There is increased opacity at the LEFT lung base, consistent with atelectasis or consolidation and pleural effusion. No bowel obstruction. Remote posttraumatic diastases of the symphysis pubis. Bilateral hip replacements. Degenerative changes are seen in the spine. IMPRESSION: 1. Developing density at the LEFT lung base. 2. Gaseous distension of the stomach. 3. Nonobstructed bowel-gas pattern. Electronically Signed   By: Nolon Nations M.D.   On: 08/30/2018 13:36        Scheduled Meds: . chlorhexidine  15 mL Mouth Rinse BID  . Chlorhexidine Gluconate Cloth  6 each Topical Q0600  . fluticasone  2 spray Each Nare Daily  . insulin aspart  0-9 Units Subcutaneous Q4H  . latanoprost  1 drop Both Eyes QHS  . mouth rinse  15 mL Mouth Rinse q12n4p  . metoprolol succinate  12.5 mg Oral Daily  . pantoprazole  40 mg Oral BID AC  . pravastatin  40 mg Oral Daily  . sodium chloride flush  10-40 mL Intracatheter Q12H  . tamsulosin  0.8 mg Oral Daily   Continuous Infusions: . dextrose 50 mL/hr at 09/01/18 0300     LOS: 13 days    Time spent: [place], MD [facility]  If 7PM-7AM, please contact night-coverage [email or web address] Password [email or web address] 09/01/2018, 9:55 AM

## 2018-09-01 NOTE — Progress Notes (Signed)
As per Infectious Disease RN, pt is to remain on Enteric Precautions for 30 days post treatment. Enteric precautions reinstated.

## 2018-09-01 NOTE — Patient Outreach (Signed)
[facility]) Care Management  09/01/2018  DAIQUAN RESNIK [birth year] [id number]   CSW closing out patient as he has been admitted to [facility] for longer than 10 days. CSW sent inbasket message to [facility] [place] to make aware.    [name], LCSW [facility]  Clinical Social Worker cell #: [phone]

## 2018-09-02 ENCOUNTER — Inpatient Hospital Stay (HOSPITAL_COMMUNITY): Payer: PPO

## 2018-09-02 LAB — CBC
HCT: 25.8 % — ABNORMAL LOW (ref 39.0–52.0)
Hemoglobin: 8 g/dL — ABNORMAL LOW (ref 13.0–17.0)
MCH: 29.3 pg (ref 26.0–34.0)
MCHC: 31 g/dL (ref 30.0–36.0)
MCV: 94.5 fL (ref 78.0–100.0)
Platelets: 136 10*3/uL — ABNORMAL LOW (ref 150–400)
RBC: 2.73 MIL/uL — ABNORMAL LOW (ref 4.22–5.81)
RDW: 16.2 % — ABNORMAL HIGH (ref 11.5–15.5)
WBC: 11.6 K/uL — ABNORMAL HIGH (ref 4.0–10.5)

## 2018-09-02 LAB — HEPARIN LEVEL (UNFRACTIONATED): HEPARIN UNFRACTIONATED: 0.3 [IU]/mL (ref 0.30–0.70)

## 2018-09-02 LAB — GLUCOSE, CAPILLARY
GLUCOSE-CAPILLARY: 366 mg/dL — AB (ref 70–99)
GLUCOSE-CAPILLARY: 132 mg/dL — AB (ref 70–99)
GLUCOSE-CAPILLARY: 136 mg/dL — AB (ref 70–99)
Glucose-Capillary: 115 mg/dL — ABNORMAL HIGH (ref 70–99)
Glucose-Capillary: 151 mg/dL — ABNORMAL HIGH (ref 70–99)
Glucose-Capillary: 111 mg/dL — ABNORMAL HIGH (ref 70–99)

## 2018-09-02 LAB — PROCALCITONIN: Procalcitonin: <0.1 ng/mL

## 2018-09-02 LAB — PROTIME-INR
INR: 1.03
PROTHROMBIN TIME: 13.4 s (ref 11.4–15.2)

## 2018-09-02 LAB — APTT: aPTT: 36 seconds (ref 24–36)

## 2018-09-02 MED ORDER — IOPAMIDOL (ISOVUE-370) INJECTION 76%
100.0000 mL | Freq: Once | INTRAVENOUS | Status: AC | PRN
  Administered 2018-09-02: 100 mL via INTRAVENOUS

## 2018-09-02 MED ORDER — ADULT MULTIVITAMIN W/MINERALS CH
1.0000 | ORAL_TABLET | Freq: Every day | ORAL | Status: DC
  Administered 2018-09-02 – 2018-09-04 (×3): 1 via ORAL
  Filled 2018-09-02 (×3): qty 1

## 2018-09-02 MED ORDER — HEPARIN (PORCINE) IN NACL 100-0.45 UNIT/ML-% IJ SOLN
1400.0000 [IU]/h | INTRAMUSCULAR | Status: DC
  Administered 2018-09-02 – 2018-09-03 (×2): 1400 [IU]/h via INTRAVENOUS
  Filled 2018-09-02 (×2): qty 250

## 2018-09-02 MED ORDER — IOPAMIDOL (ISOVUE-370) INJECTION 76%
INTRAVENOUS | Status: AC
  Filled 2018-09-02: qty 100

## 2018-09-02 MED ORDER — JUVEN PO PACK
1.0000 | PACK | Freq: Two times a day (BID) | ORAL | Status: DC
  Administered 2018-09-03 – 2018-09-04 (×2): 1 via ORAL
  Filled 2018-09-02 (×6): qty 1

## 2018-09-02 NOTE — Progress Notes (Signed)
Pt. seen for CPAP order, not wanting to wear at this time, refused prior evening, unit remains in room, stated would notify if wanting to wear.

## 2018-09-02 NOTE — Care Management (Signed)
  Discharge Readiness Milestones  hospital day 14  MCG Used: hypoxia Optimal GLOS: tbd Hospital Day: 14  Discharge Readiness Milestones not yet  Identified Barriers: Shortness of breath/hypoxia: Patient continues to have episodes of shortness of breath and hypoxia.  At this time the chest x-ray shows no evidence of any infiltrate or fluid overload however does show some atelectasis versus infiltrate on the left base.   Date/Name case discussed with attending MD [phone] suggested ltach for this patient. Date/name case discussed with supervisor (if applicable):  Date/name case referred to physician advisor (if applicable)   Concurrent Review - Extended Stay prolonged stay Return to top of Chronic Obstructive Pulmonary Disease RRG - ISC Minimal (a few hours to 1 day), Brief (1 to 3 days), Moderate (4 to 7 days), and Prolonged (more than 7 days). [Expand All / Collapse All]  Extended stay beyond goal length of stay may be needed for: ? Respiratory failure  Anticipate intensive bronchodilator treatment, systemic steroids, and investigation of inciting causes (eg, pneumonia, other infections).    Anticipate noninvasive ventilation to facilitate weaning from endotracheal intubation and mechanical ventilation. c-pap  Transition patient to recovery facility (if prolonged ventilator support needed) when patient is stable.  Expect brief to moderate stay extension.  See Respiratory Failure GRGGRG guideline as appropriate. ? Severe or persistent hypoxemia or hypercarbia  Persisting hypoxemia or hypercarbia will require continued intensive respiratory therapy, investigation of inciting causes (eg, pneumonia, pulmonary embolism), and possible antibiotics.  Transition patient to recovery facility for continued respiratory therapy and oxygen when patient is stable.  Expect brief to moderate stay extension. ? Severe or persistent dyspnea  Activity limitations may prevent rapid  discharge to home.  Transition patient to recovery facility for skilled care during recovery.  Expect brief stay extension. ? Clinically significant comorbidities (eg, chronic heart failure, atrial fibrillation with rapid ventricular rate, pneumonia)  Patient with comorbidities may require additional days of care to stabilize.  Transfer patient to recovery facility when appropriate.  Expect brief stay extension. ? Concomitant asthma (eg, asthma-COPD overlap)  Severe attacks may result in slow resolution of signs and symptoms.  Anticipate continued bronchodilation treatment and flow monitoring.  Expect brief stay extension. ? Malnutrition  Malnourished patient may require longer hospitalization.  Expect brief stay extension.  See Malnutrition: Common Complications and ConditionsISC guideline as appropriate. ?  Other complication or condition    Discharge Readiness Milestones, Not Met

## 2018-09-02 NOTE — Progress Notes (Signed)
PROGRESS NOTE [facility]   [name]   EPP:[id number] DOB: [birth year]  DOA: 08/19/2018 PCP: Siren Bing, DO   Brief Narrative:  [name] 77 y/o M with PMHx of 77 year old male with past medical history significant for chronic systolic and diastolic heart failure, hyperlipidemia, BPH and recently treated for C. difficile colitis presented to the emergency department on 8/21 complaining of black tarry stools and hypotension.  Patient was subsequently found to be hypoxic, anemic with hemoglobin of 6.7.  Patient was transfused and scheduled for outpatient GI work-up however on 8/24 patient declined euvolemic shock and was transferred to the ICU on 08/27/2018, patient underwent EGD which showed large duodenal bulb ulcer with visible vessel and was treated with epi injection and gold probe cautery.  Patient has remained hypoxic with no significant cause.   Subjective: Patient seen and examined, report having some difficulty breathing but somewhat improved from yesterday.  Continues to be hypoxic while speaking and intermittently.  Denies chest pain.  Patient report some dizziness yesterday.  Assessment & Plan: Acute respiratory failure with hypoxia  Will check CTA to r/o PE.  Chest x-ray with no evidence of infectious process however does show some atelectasis.  Continue oxygen supplementation as needed and wean as able.  Upper GI bleed Due to bleeding vessel at the duodenal bulb ulcer. Patient was transfused total of 8 units of PRBCs during hospital stay.  Underwent EGD and ulcer was treated with epi injection and gold probe cautery.  No signs of overt bleeding.  Hemoglobin stable.  GI recommended to continue Protonix 40 mg twice daily.  They have sign off  Recent PEA arrest In the setting of acute systolic heart failure.  Losartan currently on hold.  Seems to be compensated.  No signs of fluid overload at this time.  Continue torsemide 40 mg daily.  Continue to  monitor.  Recently diagnosed with C. difficile S/p oral vancomycin course ended 8/29  BPH  Continue tamsulosin 0.8 mg qHS   Addendum 1:00 PM  CTA chest positive for PE, start heparin ggt at low dose, will discuss with GI if ok to start NOAC at this time    DVT prophylaxis: Heparin ggt  Code Status: Full Code  Family Communication: None at bedside  Disposition Plan: SNF in 1-2 days, need to monitor hgb closely given recent GI bleed episode   Consultants:   GI  PCCM  Procedures:   EGD   Antimicrobials: Anti-infectives (From admission, onward)   Start     Dose/Rate Route Frequency Ordered Stop   08/22/18 0000  vancomycin (VANCOCIN) 50 mg/mL oral solution     125 mg Oral 4 times daily 08/22/18 1323     08/19/18 1930  vancomycin (VANCOCIN) 50 mg/mL oral solution 125 mg     125 mg Oral 4 times daily 08/19/18 1812 08/29/18 1759        Objective: Vitals:   09/02/18 0000 09/02/18 0335 09/02/18 0346 09/02/18 0400  BP: (!) 119/57   (!) 115/52  Pulse: 74   70  Resp: 15   14  Temp:   97.6 F (36.4 C)   TempSrc:   Oral   SpO2: 100%   100%  Weight:  (!) 139.9 kg    Height:        Intake/Output Summary (Last 24 hours) at 09/02/2018 0828 Last data filed at 09/02/2018 0400 Gross per 24 hour  Intake 1603.04 ml  Output 1250 ml  Net 353.04 ml  Filed Weights   08/31/18 0354 09/01/18 0415 09/02/18 0335  Weight: (!) 141.3 kg (!) 142 kg (!) 139.9 kg    Examination:  General exam: Appears calm and comfortable, [place] 3L  HEENT: AC/AT, PERRLA, OP moist and clear Respiratory system: Breath sounds diminished b/l, no crackles or rales  Cardiovascular system: S1 & S2 heard, RRR. No JVD, murmurs, rubs or gallops Gastrointestinal system: Abdomen is nondistended, soft and nontender.  Central nervous system: Alert and oriented. Extremities: No LE edema.   Skin: No rashes Psychiatry: . Mood & affect appropriate.    Data Reviewed: I have personally reviewed following labs and imaging  studies  CBC: Recent Labs  Lab 08/26/18 1339  08/30/18 0430 08/30/18 1409 08/31/18 0348 08/31/18 1600 09/01/18 0458 09/02/18 0337  WBC 14.6*   < > 11.1* 12.2* 10.4  --  10.4 11.6*  NEUTROABS 11.7*  --  8.2* 9.6* 7.3  --   --   --   HGB 6.6*   < > 8.8* 9.5* 8.6* 8.7* 8.7* 8.0*  HCT 20.7*   < > 28.2* 30.3* 27.4* 27.9* 28.6* 25.8*  MCV 89.6   < > 94.6 93.8 93.8  --  94.7 94.5  PLT 215   < > 130* 140* 135*  --  142* 136*   < > = values in this interval not displayed.   Basic Metabolic Panel: Recent Labs  Lab 08/28/18 0525 08/29/18 0526 08/30/18 0430 08/31/18 0348 09/01/18 0458  NA 151* 144 149* 134* 140  K 3.8 3.7 3.9 3.7 4.1  CL 116* 109 114* 98 104  CO2 30 29 31  32 31  GLUCOSE 114* 223* 90 405* 107*  BUN 21 14 13 13 11   CREATININE 0.82 0.80 0.86 0.85 0.71  CALCIUM 8.3* 8.0* 8.1* 7.6* 8.1*  MG  --   --   --   --  1.7   GFR: Estimated Creatinine Clearance: 110.3 mL/min (by C-G formula based on SCr of 0.71 mg/dL). Liver Function Tests: Recent Labs  Lab 08/26/18 1032 08/27/18 1145 08/28/18 0525 08/30/18 0430 09/01/18 0458  AST 15 10* 10* 9* 10*  ALT 10 7 7 8 7   ALKPHOS 70 69 75 73 84  BILITOT 0.5 0.6 0.7 0.6 0.5  PROT 5.6* 5.1* 5.5* 5.3* 5.6*  ALBUMIN 2.0* 2.0* 2.2* 2.0* 2.0*   No results for input(s): LIPASE, AMYLASE in the last 168 hours. No results for input(s): AMMONIA in the last 168 hours. Coagulation Profile: Recent Labs  Lab 08/26/18 1643  INR 1.21   Cardiac Enzymes: No results for input(s): CKTOTAL, CKMB, CKMBINDEX, TROPONINI in the last 168 hours. BNP (last 3 results) No results for input(s): PROBNP in the last 8760 hours. HbA1C: No results for input(s): HGBA1C in the last 72 hours. CBG: Recent Labs  Lab 09/01/18 1920 09/01/18 2320 09/02/18 0320 09/02/18 0331 09/02/18 0801  GLUCAP 118* 140* 366* 132* 136*   Lipid Profile: No results for input(s): CHOL, HDL, LDLCALC, TRIG, CHOLHDL, LDLDIRECT in the last 72 hours. Thyroid Function  Tests: No results for input(s): TSH, T4TOTAL, FREET4, T3FREE, THYROIDAB in the last 72 hours. Anemia Panel: No results for input(s): VITAMINB12, FOLATE, FERRITIN, TIBC, IRON, RETICCTPCT in the last 72 hours. Sepsis Labs: Recent Labs  Lab 09/01/18 1128 09/02/18 0337  PROCALCITON <0.10 <0.10    No results found for this or any previous visit (from the past 240 hour(s)).    Radiology Studies: Dg Chest 1 View  Result Date: 09/01/2018 CLINICAL DATA:  Shortness of breath.  Diabetes and hypertension. Personal history of prostate carcinoma. EXAM: CHEST  1 VIEW COMPARISON:  08/29/2018 FINDINGS: Right arm PICC line remains in appropriate position. Heart size is stable. Mild elevation of left hemidiaphragm again noted. There is persistent atelectasis or infiltrate in the left lung base. Mild increase in atelectasis noted in right lung base. No pneumothorax visualized. IMPRESSION: Left lower lobe atelectasis versus infiltrate, without significant change. Increased mild right basilar atelectasis. Electronically Signed   By: Earle Gell M.D.   On: 09/01/2018 08:40      Scheduled Meds: . chlorhexidine  15 mL Mouth Rinse BID  . Chlorhexidine Gluconate Cloth  6 each Topical Q0600  . fluticasone  2 spray Each Nare Daily  . insulin aspart  0-9 Units Subcutaneous Q4H  . latanoprost  1 drop Both Eyes QHS  . mouth rinse  15 mL Mouth Rinse q12n4p  . metoprolol succinate  12.5 mg Oral Daily  . pantoprazole  40 mg Oral BID AC  . pravastatin  40 mg Oral Daily  . sodium chloride flush  10-40 mL Intracatheter Q12H  . tamsulosin  0.8 mg Oral Daily  . torsemide  40 mg Oral Daily   Continuous Infusions: . dextrose 50 mL/hr at 09/02/18 0400     LOS: 14 days    Time spent: Total of 35 minutes spent with pt, greater than 50% of which was spent in discussion of  treatment, counseling and coordination of care   Chipper Oman, MD Pager: Text Page via [email or web address]   If 7PM-7AM, please contact  night-coverage [email or web address] 09/02/2018, 8:28 AM   Note - This record has been created using [employer]. Chart creation errors have been sought, but may not always have been located. Such creation errors do not reflect on the standard of medical care.

## 2018-09-02 NOTE — Progress Notes (Signed)
ANTICOAGULATION CONSULT NOTE - Initial Consult  Pharmacy Consult for Heparin Indication: pulmonary embolus  Allergies  Allergen Reactions  . Ace Inhibitors Cough    With Lisinopril.  . Angiotensin Receptor Blockers Other (See Comments)    Caused excessive weight gain  . Metformin Diarrhea    Patient Measurements: Height: 6' (182.9 cm) Weight: (!) 308 lb 6.8 oz (139.9 kg) IBW/kg (Calculated) : 77.6 Heparin Dosing Weight: 110kg  Vital Signs: Temp: 98.4 F (36.9 C) (09/04 0800) Temp Source: Oral (09/04 0800) BP: 104/59 (09/04 1200) Pulse Rate: 85 (09/04 1200)  Labs: Recent Labs    08/31/18 0348 08/31/18 1600 09/01/18 0458 09/02/18 0337  HGB 8.6* 8.7* 8.7* 8.0*  HCT 27.4* 27.9* 28.6* 25.8*  PLT 135*  --  142* 136*  CREATININE 0.85  --  0.71  --     Estimated Creatinine Clearance: 110.3 mL/min (by C-G formula based on SCr of 0.71 mg/dL).   Medical History: Past Medical History:  Diagnosis Date  . Chronic osteomyelitis of lower leg (HCC) 11/20/2010   Chronic osteoarthritis of right lower leg-10/03/10 note by Dr. Harvie Heck scanned of this visit under media tab. Dr. Sharol Given states that this is chronic osteomyelitis, only cure would be amputation. Patient was not agreeable to this at his visit with Dr. Sharol Given. Patient elected to apply Bactroban cream, Dr. Sharol Given and time of that visit also prescribe doxycycline. Dr. Sharol Given stay the patient she come to the [facility] 02/26/2007   Qualifier: Diagnosis of  By: Erling Cruz  MD, [place]    . Complication of anesthesia   . Diabetes mellitus   . Diabetic peripheral neuropathy associated with type 2 diabetes mellitus ([place]) 02/07/2014  . GERD (gastroesophageal reflux disease)   . Grade II diastolic dysfunction   . Heart murmur    last 2D Echo -03/31/08  . HIATAL HERNIA WITH REFLUX 09/29/2006   Qualifier: Diagnosis of  By: Erling Cruz  MD, MELISSA    . HIP REPLACEMENT, BILATERAL, HX OF 05/12/2007   Qualifier: Diagnosis of  By: Erling Cruz  MD, MELISSA    .  History of blood transfusion   . Hypertension   . OSTEOARTHRITIS 03/05/2007   Qualifier: Diagnosis of  By: Erling Cruz  MD, MELISSA    . PONV (postoperative nausea and vomiting)   . Prostate cancer ([place])   . SBO (small bowel obstruction) ([place]) 02/2017  . Seasonal allergies   . VENOUS INSUFFICIENCY, CHRONIC 02/26/2007   Qualifier: Diagnosis of  By: LOVE  MD, MELISSA      Medications:  Scheduled:  . chlorhexidine  15 mL Mouth Rinse BID  . Chlorhexidine Gluconate Cloth  6 each Topical Q0600  . fluticasone  2 spray Each Nare Daily  . insulin aspart  0-9 Units Subcutaneous Q4H  . iopamidol      . latanoprost  1 drop Both Eyes QHS  . mouth rinse  15 mL Mouth Rinse q12n4p  . metoprolol succinate  12.5 mg Oral Daily  . multivitamin with minerals  1 tablet Oral Daily  . nutrition supplement (JUVEN)  1 packet Oral BID BM  . pantoprazole  40 mg Oral BID AC  . pravastatin  40 mg Oral Daily  . sodium chloride flush  10-40 mL Intracatheter Q12H  . tamsulosin  0.8 mg Oral Daily  . torsemide  40 mg Oral Daily   Infusions:   PRN: sodium chloride flush  Assessment: 77 yo male admitted with upper GI bleed s/p EGD 8/29 that showed large duodenal bulb  ulcer with visible vessel status post epi injection and gold probe cautery. PMH includes chronic systolic and diastolic heart failure, recent treatment for C. difficile colitis, hyperlipidemia, BPH.  CTa on 9/4 shows PE in segmental branches of the left upper lobe. Pharmacy is consulted to dose IV heparin.  Baseline PTT, PT/INR pending Hgb = 8.0 today, has received 8 units PRBC since 8/21 Plts = 136 SCr 0.71  Goal of Therapy:  Heparin levels 0.3 - 0.5 (lower goal per discussion with MD due to recent GI bleeding) Monitor platelets by anticoagulation protocol: Yes   Plan:   No heparin bolus  Heparin IV infusion 1400 units/hr  Check heparin level in 8hrs  Daily heparin level, CBC  Peggyann Juba, PharmD, BCPS Pager: [phone] 09/02/2018,1:15  PM

## 2018-09-02 NOTE — Progress Notes (Signed)
Patient is refusing CPAP for the night. RT will continue to monitor. 

## 2018-09-02 NOTE — Progress Notes (Signed)
ANTICOAGULATION CONSULT NOTE -  Consult  Pharmacy Consult for Heparin Indication: pulmonary embolus  Allergies  Allergen Reactions  . Ace Inhibitors Cough    With Lisinopril.  . Angiotensin Receptor Blockers Other (See Comments)    Caused excessive weight gain  . Metformin Diarrhea    Patient Measurements: Height: 6' (182.9 cm) Weight: (!) 308 lb 6.8 oz (139.9 kg) IBW/kg (Calculated) : 77.6 Heparin Dosing Weight: 110kg  Vital Signs: Temp: 98.1 F (36.7 C) (09/04 2053) Temp Source: Oral (09/04 2053) BP: 122/61 (09/04 2053) Pulse Rate: 85 (09/04 2053)  Labs: Recent Labs    08/31/18 0348 08/31/18 1600 09/01/18 0458 09/02/18 0337 09/02/18 1405 09/02/18 2143  HGB 8.6* 8.7* 8.7* 8.0*  --   --   HCT 27.4* 27.9* 28.6* 25.8*  --   --   PLT 135*  --  142* 136*  --   --   APTT  --   --   --   --  36  --   LABPROT  --   --   --   --  13.4  --   INR  --   --   --   --  1.03  --   HEPARINUNFRC  --   --   --   --   --  0.30  CREATININE 0.85  --  0.71  --   --   --     Estimated Creatinine Clearance: 110.3 mL/min (by C-G formula based on SCr of 0.71 mg/dL).   Medical History: Past Medical History:  Diagnosis Date  . Chronic osteomyelitis of lower leg (HCC) 11/20/2010   Chronic osteoarthritis of right lower leg-10/03/10 note by Dr. Harvie Heck scanned of this visit under media tab. Dr. Sharol Given states that this is chronic osteomyelitis, only cure would be amputation. Patient was not agreeable to this at his visit with Dr. Sharol Given. Patient elected to apply Bactroban cream, Dr. Sharol Given and time of that visit also prescribe doxycycline. Dr. Sharol Given stay the patient she come to the [facility] 02/26/2007   Qualifier: Diagnosis of  By: Erling Cruz  MD, [place]    . Complication of anesthesia   . Diabetes mellitus   . Diabetic peripheral neuropathy associated with type 2 diabetes mellitus ([place]) 02/07/2014  . GERD (gastroesophageal reflux disease)   . Grade II diastolic dysfunction   . Heart murmur     last 2D Echo -03/31/08  . HIATAL HERNIA WITH REFLUX 09/29/2006   Qualifier: Diagnosis of  By: Erling Cruz  MD, MELISSA    . HIP REPLACEMENT, BILATERAL, HX OF 05/12/2007   Qualifier: Diagnosis of  By: Erling Cruz  MD, MELISSA    . History of blood transfusion   . Hypertension   . OSTEOARTHRITIS 03/05/2007   Qualifier: Diagnosis of  By: Erling Cruz  MD, MELISSA    . PONV (postoperative nausea and vomiting)   . Prostate cancer ([place])   . SBO (small bowel obstruction) ([place]) 02/2017  . Seasonal allergies   . VENOUS INSUFFICIENCY, CHRONIC 02/26/2007   Qualifier: Diagnosis of  By: LOVE  MD, MELISSA      Medications:  Scheduled:  . chlorhexidine  15 mL Mouth Rinse BID  . Chlorhexidine Gluconate Cloth  6 each Topical Q0600  . fluticasone  2 spray Each Nare Daily  . insulin aspart  0-9 Units Subcutaneous Q4H  . iopamidol      . latanoprost  1 drop Both Eyes QHS  . mouth rinse  15 mL Mouth Rinse q12n4p  .  metoprolol succinate  12.5 mg Oral Daily  . multivitamin with minerals  1 tablet Oral Daily  . nutrition supplement (JUVEN)  1 packet Oral BID BM  . pantoprazole  40 mg Oral BID AC  . pravastatin  40 mg Oral Daily  . sodium chloride flush  10-40 mL Intracatheter Q12H  . tamsulosin  0.8 mg Oral Daily  . torsemide  40 mg Oral Daily   Infusions:  . heparin 1,400 Units/hr (09/02/18 1500)   PRN: sodium chloride flush  Assessment: 77 yo male admitted with upper GI bleed s/p EGD 8/29 that showed large duodenal bulb ulcer with visible vessel status post epi injection and gold probe cautery. PMH includes chronic systolic and diastolic heart failure, recent treatment for C. difficile colitis, hyperlipidemia, BPH.  CTa on 9/4 shows PE in segmental branches of the left upper lobe. Pharmacy is consulted to dose IV heparin.  Baseline PTT, PT/INR pending Hgb = 8.0 today, has received 8 units PRBC since 8/21 Plts = 136 SCr 0.71 Today, 9/4 2143 HL= 0.30 at goal, no infusion or bleeding issues per RN.  Goal of Therapy:   Heparin levels 0.3 - 0.5 (lower goal per discussion with MD due to recent GI bleeding) Monitor platelets by anticoagulation protocol: Yes   Plan:   Continue Heparin IV infusion at 1400 units/hr  Daily heparin level, CBC   Lawana Pai R 09/02/2018,10:16 PM

## 2018-09-02 NOTE — Progress Notes (Signed)
PT Cancellation Note  Patient Details Name: [name] MRN: [id number] DOB: Mar 12, 1941   Cancelled Treatment:    Reason Eval/Treat Not Completed: Medical issues which prohibited therapy, recent diagnosis with PE> check back when medically able to participate.   Claretha Cooper 09/02/2018, 2:32 PM  Tresa Endo PT [phone]

## 2018-09-02 NOTE — Progress Notes (Signed)
Nutrition Follow-up  DOCUMENTATION CODES:   Non-severe (moderate) malnutrition in context of chronic illness, Obesity unspecified  INTERVENTION:  - Continue Magic cup TID with meals, each supplement provides 290 kcal and 9 grams of protein. - Will order Juven BID, each packet provides 80 calories and 14 grams of amino acids; supplement contains CaHMB, glutamine, and arginine, to promote wound healing. - Will order daily multivitamin with minerals.  - Continue to encourage PO intakes.    NUTRITION DIAGNOSIS:   Moderate Malnutrition related to chronic illness(chronic osteomyelitis) as evidenced by mild fat depletion, moderate muscle depletion. -ongoing  GOAL:   Patient will meet greater than or equal to 90% of their needs -unmet on average.   MONITOR:   PO intake, Supplement acceptance, Weight trends, Labs, Skin  ASSESSMENT:   77 yo male who was admitted due to melena and hypotension, hypovolemic shock d/t acute blood loss anemia complicated by AKI. He has PMH significant for He chronic osteomyelitis, CHF, type 2 DM, GERD, hiatal hernia, and HTN.  He was currently treated for C. difficile colitis, and noticed at home melanotic stools. Recent hospitalization for sepsis and cardiac arrest 07/31/18.  Weight has been fluctuating throughout hospitalization; no diuretics currently ordered. Diet advanced from NPO to [place] on 8/30 and from [place] to Soft on 8/31. Patient consumed 100% of lunch on 8/31 (750 kcal, 21 grams of protein), 50% of breakfast on 9/2 (307 kcal, 20 grams of protein), and 100% of dinner last night (292 kcal, 14 grams of protein). Patient is currently receiving orange cream-flavored Magic Cup with all lunch and dinner meals.  Per Dr. Harvel Quale note yesterday AM: patient with ongoing episodes of SOB and hypoxia with CXR showing no signs of fluid overload and mild atelectasis vs infiltrate in LLL, states plan for re-starting diuretics, s/p EGD on 8/29 d/t previous GIB from bleeding  vessel at duodenal bulb ulcer, s/p recent PEA arrest, c.diff dx PTA at nursing home. Discharge is pending improvement in SOB.     Medications reviewed; sliding scale Novolog, 40 mg oral Protonix BID. Labs reviewed; CBGs: 366, 132, and 136 mg/dL this AM.   Diet Order:   Diet Order            DIET SOFT Room service appropriate? Yes; Fluid consistency: Thin  Diet effective now        Diet - low sodium heart healthy              EDUCATION NEEDS:   No education needs have been identified at this time  Skin:  Skin Assessment: Skin Integrity Issues: Skin Integrity Issues:: Unstageable Stage II: L heel, L thigh, coccyx, sacrum Unstageable: full thickness to R toe  Last BM:  8/29  Height:   Ht Readings from Last 1 Encounters:  08/19/18 6' (1.829 m)    Weight:   Wt Readings from Last 1 Encounters:  09/02/18 (!) 139.9 kg    Ideal Body Weight:  80.91 kg  BMI:  Body mass index is 41.83 kg/m.  Estimated Nutritional Needs:   Kcal:  4401-0272 (15-17 kcal/kg)  Protein:  120-130 grams  Fluid:  >/= 2 L/day     Joel Matin, MS, RD, LDN, [facility] Inpatient Clinical Dietitian Pager # [phone] After hours/weekend pager # [phone]

## 2018-09-02 NOTE — Progress Notes (Signed)
Pt transferred from ICU/SD in stable condition, pt alert and oriented. Telemetry placed box #37, pt noted with various skin issues: Stage 2 b/t the ischium tuberosity, lateral left hip. Pt replaced external catheter, noted with large tarry stool. VSs. Prevalon boots replaced, bil legs wrapped in Kerlix.Heparin infusing as ordered. SRP, RN

## 2018-09-03 LAB — BASIC METABOLIC PANEL
ANION GAP: 8 (ref 5–15)
Anion gap: 7 (ref 5–15)
BUN: 21 mg/dL (ref 8–23)
BUN: 25 mg/dL — AB (ref 8–23)
CALCIUM: 8.1 mg/dL — AB (ref 8.9–10.3)
CALCIUM: 8.1 mg/dL — AB (ref 8.9–10.3)
CO2: 33 mmol/L — AB (ref 22–32)
CO2: 35 mmol/L — ABNORMAL HIGH (ref 22–32)
CREATININE: 1.05 mg/dL (ref 0.61–1.24)
Chloride: 98 mmol/L (ref 98–111)
Chloride: 100 mmol/L (ref 98–111)
Creatinine, Ser: 0.97 mg/dL (ref 0.61–1.24)
GFR calc non Af Amer: >60 mL/min (ref >60)
GFR calc non Af Amer: >60 mL/min (ref >60)
GLUCOSE: 166 mg/dL — AB (ref 70–99)
Glucose, Bld: 116 mg/dL — ABNORMAL HIGH (ref 70–99)
Potassium: 4.1 mmol/L (ref 3.5–5.1)
Potassium: 3.8 mmol/L (ref 3.5–5.1)
Sodium: 139 mmol/L (ref 135–145)
Sodium: 142 mmol/L (ref 135–145)

## 2018-09-03 LAB — GLUCOSE, CAPILLARY
GLUCOSE-CAPILLARY: 104 mg/dL — AB (ref 70–99)
GLUCOSE-CAPILLARY: 111 mg/dL — AB (ref 70–99)
Glucose-Capillary: 118 mg/dL — ABNORMAL HIGH (ref 70–99)
Glucose-Capillary: 144 mg/dL — ABNORMAL HIGH (ref 70–99)
Glucose-Capillary: 79 mg/dL (ref 70–99)

## 2018-09-03 LAB — CBC WITH DIFFERENTIAL/PLATELET
BASOS PCT: 0 %
Basophils Absolute: 0 10*3/uL (ref 0.0–0.1)
EOS ABS: 0.5 10*3/uL (ref 0.0–0.7)
EOS PCT: 5 %
HEMATOCRIT: 25.3 % — AB (ref 39.0–52.0)
Hemoglobin: 7.9 g/dL — ABNORMAL LOW (ref 13.0–17.0)
Lymphocytes Relative: 14 %
Lymphs Abs: 1.3 10*3/uL (ref 0.7–4.0)
MCH: 29.3 pg (ref 26.0–34.0)
MCHC: 31.2 g/dL (ref 30.0–36.0)
MCV: 93.7 fL (ref 78.0–100.0)
MONO ABS: 0.8 10*3/uL (ref 0.1–1.0)
MONOS PCT: 8 %
Neutro Abs: 6.7 10*3/uL (ref 1.7–7.7)
Neutrophils Relative %: 73 %
PLATELETS: 174 10*3/uL (ref 150–400)
RBC: 2.7 MIL/uL — ABNORMAL LOW (ref 4.22–5.81)
RDW: 16.2 % — ABNORMAL HIGH (ref 11.5–15.5)
WBC: 9.3 10*3/uL (ref 4.0–10.5)

## 2018-09-03 LAB — CBC
HEMATOCRIT: 25.8 % — AB (ref 39.0–52.0)
HEMOGLOBIN: 7.9 g/dL — AB (ref 13.0–17.0)
MCH: 28.7 pg (ref 26.0–34.0)
MCHC: 30.6 g/dL (ref 30.0–36.0)
MCV: 93.8 fL (ref 78.0–100.0)
Platelets: 158 10*3/uL (ref 150–400)
RBC: 2.75 MIL/uL — ABNORMAL LOW (ref 4.22–5.81)
RDW: 16.2 % — AB (ref 11.5–15.5)
WBC: 9.3 10*3/uL (ref 4.0–10.5)

## 2018-09-03 LAB — HEPARIN LEVEL (UNFRACTIONATED)
HEPARIN UNFRACTIONATED: 0.8 [IU]/mL — AB (ref 0.30–0.70)
Heparin Unfractionated: 0.58 IU/mL (ref 0.30–0.70)

## 2018-09-03 LAB — MAGNESIUM: Magnesium: 1.5 mg/dL — ABNORMAL LOW (ref 1.7–2.4)

## 2018-09-03 LAB — PROCALCITONIN: Procalcitonin: <0.1 ng/mL

## 2018-09-03 MED ORDER — SUCRALFATE 1 GM/10ML PO SUSP
1.0000 g | Freq: Three times a day (TID) | ORAL | Status: DC
  Administered 2018-09-03 – 2018-09-04 (×6): 1 g via ORAL
  Filled 2018-09-03 (×6): qty 10

## 2018-09-03 MED ORDER — HEPARIN (PORCINE) IN NACL 100-0.45 UNIT/ML-% IJ SOLN: 1350.0000 [IU]/h | INTRAMUSCULAR | Status: DC

## 2018-09-03 MED ORDER — HEPARIN (PORCINE) IN NACL 100-0.45 UNIT/ML-% IJ SOLN
1000.0000 [IU]/h | INTRAMUSCULAR | Status: AC
  Administered 2018-09-04: 1000 [IU]/h via INTRAVENOUS
  Filled 2018-09-03: qty 250

## 2018-09-03 MED ORDER — LOSARTAN POTASSIUM 50 MG PO TABS
25.0000 mg | ORAL_TABLET | Freq: Every day | ORAL | Status: DC
  Administered 2018-09-03 – 2018-09-04 (×2): 25 mg via ORAL
  Filled 2018-09-03 (×2): qty 1

## 2018-09-03 NOTE — Progress Notes (Signed)
Pt. seen again for CPAP, will notify if wanting to wear this evening, refused 09/02/18, unit remains in room.

## 2018-09-03 NOTE — Progress Notes (Signed)
[place] for Heparin Indication: pulmonary embolus  Allergies  Allergen Reactions  . Ace Inhibitors Cough    With Lisinopril.  . Angiotensin Receptor Blockers Other (See Comments)    Caused excessive weight gain  . Metformin Diarrhea    Patient Measurements: Height: 6' (182.9 cm) Weight: (!) 305 lb 12.5 oz (138.7 kg) IBW/kg (Calculated) : 77.6 Heparin Dosing Weight: 110kg  Vital Signs: BP: 114/59 (09/05 1230) Pulse Rate: 88 (09/05 1230)  Labs: Recent Labs    09/01/18 0458 09/02/18 0337 09/02/18 1405 09/02/18 2143 09/03/18 0450 09/03/18 1253 09/03/18 1436  HGB 8.7* 8.0*  --   --  7.9* 7.9*  --   HCT 28.6* 25.8*  --   --  25.8* 25.3*  --   PLT 142* 136*  --   --  158 174  --   APTT  --   --  36  --   --   --   --   LABPROT  --   --  13.4  --   --   --   --   INR  --   --  1.03  --   --   --   --   HEPARINUNFRC  --   --   --  0.30 0.58  --  0.80*  CREATININE 0.71  --   --   --  0.97 1.05  --     Estimated Creatinine Clearance: 83.7 mL/min (by C-G formula based on SCr of 1.05 mg/dL).  Medications:  Scheduled:  . chlorhexidine  15 mL Mouth Rinse BID  . Chlorhexidine Gluconate Cloth  6 each Topical Q0600  . fluticasone  2 spray Each Nare Daily  . insulin aspart  0-9 Units Subcutaneous Q4H  . latanoprost  1 drop Both Eyes QHS  . losartan  25 mg Oral Daily  . mouth rinse  15 mL Mouth Rinse q12n4p  . metoprolol succinate  12.5 mg Oral Daily  . multivitamin with minerals  1 tablet Oral Daily  . nutrition supplement (JUVEN)  1 packet Oral BID BM  . pantoprazole  40 mg Oral BID AC  . pravastatin  40 mg Oral Daily  . sodium chloride flush  10-40 mL Intracatheter Q12H  . sucralfate  1 g Oral TID WC & HS  . tamsulosin  0.8 mg Oral Daily  . torsemide  40 mg Oral Daily   Infusions:  . heparin 1,000 Units/hr (09/03/18 1638)   PRN: sodium chloride flush  Assessment: 77 yo male admitted with upper GI bleed s/p EGD 8/29 that  showed large duodenal bulb ulcer with visible vessel status post epi injection and gold probe cautery. PMH includes chronic systolic and diastolic heart failure, recent treatment for C. difficile colitis, hyperlipidemia, BPH.  CTa on 9/4 shows PE in segmental branches of the left upper lobe. Pharmacy is consulted to dose IV heparin.   Baseline INR, aPTT: WNL  Prior anticoagulation: none  Significant events:  Today, 09/03/2018:  CBC: low but stable ~8; Plt improved to WNL  Most recent heparin level supratherapeutic and significantly higher than previous level despite rate decrease from 1400 to 1350 units/hr. Per RN, lab draw appears to be appropriate (heparin through R PICC, lab draw from L hand)  No bleeding or infusion issues per nursing  CrCl: 84 ml/min  Goal of Therapy: Heparin level 0.3-0.7 units/ml Monitor platelets by anticoagulation protocol: Yes  Plan:  Hold heparin x 1 hr  Decrease heparin IV infusion  to 1000 units/hr  Check heparin level 8 hrs after rate change  Daily CBC, daily heparin level once stable  Monitor for signs of bleeding or thrombosis   Reuel Boom, PharmD, BCPS [phone] 09/03/2018, 4:50 PM

## 2018-09-03 NOTE — Care Management Important Message (Signed)
Important Message  Patient Details  Name: LAJUANE LEATHAM MRN: [id number] Date of Birth: [birth year]   Medicare Important Message Given:  Yes    Kerin Salen 09/03/2018, 12:19 [place] Message  Patient Details  Name: REI MEDLEN MRN: [id number] Date of Birth: 23-Oct-1941   Medicare Important Message Given:  Yes    Kerin Salen 09/03/2018, 12:19 PM

## 2018-09-03 NOTE — Progress Notes (Signed)
ANTICOAGULATION CONSULT NOTE -  Consult  Pharmacy Consult for Heparin Indication: pulmonary embolus  Allergies  Allergen Reactions  . Ace Inhibitors Cough    With Lisinopril.  . Angiotensin Receptor Blockers Other (See Comments)    Caused excessive weight gain  . Metformin Diarrhea    Patient Measurements: Height: 6' (182.9 cm) Weight: (!) 305 lb 12.5 oz (138.7 kg) IBW/kg (Calculated) : 77.6 Heparin Dosing Weight: 110kg  Vital Signs: Temp: 98.4 F (36.9 C) (09/05 0432) Temp Source: Oral (09/05 0432) BP: 129/62 (09/05 0432) Pulse Rate: 88 (09/05 0432)  Labs: Recent Labs    09/01/18 0458 09/02/18 0337 09/02/18 1405 09/02/18 2143 09/03/18 0450  HGB 8.7* 8.0*  --   --  7.9*  HCT 28.6* 25.8*  --   --  25.8*  PLT 142* 136*  --   --  158  APTT  --   --  36  --   --   LABPROT  --   --  13.4  --   --   INR  --   --  1.03  --   --   HEPARINUNFRC  --   --   --  0.30 0.58  CREATININE 0.71  --   --   --  0.97    Estimated Creatinine Clearance: 90.5 mL/min (by C-G formula based on SCr of 0.97 mg/dL).   Medical History: Past Medical History:  Diagnosis Date  . Chronic osteomyelitis of lower leg (HCC) 11/20/2010   Chronic osteoarthritis of right lower leg-10/03/10 note by Dr. Harvie Heck scanned of this visit under media tab. Dr. Sharol Given states that this is chronic osteomyelitis, only cure would be amputation. Patient was not agreeable to this at his visit with Dr. Sharol Given. Patient elected to apply Bactroban cream, Dr. Sharol Given and time of that visit also prescribe doxycycline. Dr. Sharol Given stay the patient she come to the [place] 02/26/2007   Qualifier: Diagnosis of  By: Erling Cruz  MD, [place]    . Complication of anesthesia   . Diabetes mellitus   . Diabetic peripheral neuropathy associated with type 2 diabetes mellitus ([place]) 02/07/2014  . GERD (gastroesophageal reflux disease)   . Grade II diastolic dysfunction   . Heart murmur    last 2D Echo -03/31/08  . HIATAL HERNIA WITH REFLUX  09/29/2006   Qualifier: Diagnosis of  By: Erling Cruz  MD, MELISSA    . HIP REPLACEMENT, BILATERAL, HX OF 05/12/2007   Qualifier: Diagnosis of  By: Erling Cruz  MD, MELISSA    . History of blood transfusion   . Hypertension   . OSTEOARTHRITIS 03/05/2007   Qualifier: Diagnosis of  By: Erling Cruz  MD, MELISSA    . PONV (postoperative nausea and vomiting)   . Prostate cancer ([place])   . SBO (small bowel obstruction) ([place]) 02/2017  . Seasonal allergies   . VENOUS INSUFFICIENCY, CHRONIC 02/26/2007   Qualifier: Diagnosis of  By: LOVE  MD, MELISSA      Medications:  Scheduled:  . chlorhexidine  15 mL Mouth Rinse BID  . Chlorhexidine Gluconate Cloth  6 each Topical Q0600  . fluticasone  2 spray Each Nare Daily  . insulin aspart  0-9 Units Subcutaneous Q4H  . latanoprost  1 drop Both Eyes QHS  . mouth rinse  15 mL Mouth Rinse q12n4p  . metoprolol succinate  12.5 mg Oral Daily  . multivitamin with minerals  1 tablet Oral Daily  . nutrition supplement (JUVEN)  1 packet Oral BID BM  .  pantoprazole  40 mg Oral BID AC  . pravastatin  40 mg Oral Daily  . sodium chloride flush  10-40 mL Intracatheter Q12H  . tamsulosin  0.8 mg Oral Daily  . torsemide  40 mg Oral Daily   Infusions:  . heparin     PRN: sodium chloride flush  Assessment: 77 yo male admitted with upper GI bleed s/p EGD 8/29 that showed large duodenal bulb ulcer with visible vessel status post epi injection and gold probe cautery. PMH includes chronic systolic and diastolic heart failure, recent treatment for C. difficile colitis, hyperlipidemia, BPH.  CTa on 9/4 shows PE in segmental branches of the left upper lobe. Pharmacy is consulted to dose IV heparin.  Baseline PTT, PT/INR pending Hgb = 8.0 today, has received 8 units PRBC since 8/21 Plts = 136 SCr 0.71 2143 HL= 0.30 at goal, no infusion or bleeding issues per RN.  Today, 9/5 0450 HL= 0.58 trending up, no infusion or bleeding issuer per RN.  H/H=7.9/25.8, Plts=158 Goal of Therapy:   Heparin levels 0.3 - 0.5 (lower goal per discussion with MD due to recent GI bleeding) Monitor platelets by anticoagulation protocol: Yes   Plan:   Decrease heparin drip slightly to 1350 units/hr   Recheck HL in 8 hours  Daily heparin level, CBC   Lawana Pai R 09/03/2018,6:01 AM

## 2018-09-03 NOTE — Progress Notes (Signed)
PROGRESS NOTE [facility]   [name]   VXB:[id number] DOB: Jul 10, 1941  DOA: 08/19/2018 PCP: Skedee Bing, DO   Brief Narrative:  [name] 77 y/o M with PMHx of 77 year old male with past medical history significant for chronic systolic and diastolic heart failure, hyperlipidemia, BPH and recently treated for C. difficile colitis presented to the emergency department on 8/21 complaining of black tarry stools and hypotension.  Patient was subsequently found to be hypoxic, anemic with hemoglobin of 6.7.  Patient was transfused and scheduled for outpatient GI work-up however on 8/24 patient declined euvolemic shock and was transferred to the ICU on 08/27/2018, patient underwent EGD which showed large duodenal bulb ulcer with visible vessel and was treated with epi injection and gold probe cautery.  Patient has remained hypoxic with no significant cause.   Subjective: Patient seen and examined, SOB improved from yesterday. Has no complaints today. No signs of bleeding. Denies chest pain, SOB and palpitations.   Assessment & Plan: Acute respiratory failure with hypoxia due to PE  Chest CTA + for PE. Chest x-ray with no evidence of infectious process however does show some atelectasis.  Continue oxygen supplementation as needed and wean as able. Treat underlying causes.   Upper GI bleed Due to bleeding vessel at the duodenal bulb ulcer. Patient has been transfused total of 8 units of PRBCs during hospital stay.  Underwent EGD and ulcer was treated with epi injection and gold probe cautery.  No signs of overt/recurrent bleeding.  Hemoglobin stable. GI recommended to continue Protonix 40 mg twice daily indefinitely, will add carafate as patient has been started on oral a/c. They have sign off. I have discussed case with dr  Therisa Doyne whom agree with anticoagulation therapy at this time.   New Acute PE  CTA shows pulmonary emboli in the segmental branches of the left upper lobe.   Unclear etiology at this time.  Patient sedentary with venous insufficiency.  Will not check lower extremity Doppler ultrasound at this time as patient has multiple wounds on bilateral lower extremity.  Patient is being treated with heparin ggt. given recent episode of GI bleed we will continue to monitor CBC closely on keeping heparin IV for at least total of 48 hours.  If there is no recurrent bleeding will change to oral anticoagulation.  Will likely use Eliquis as high for better GI profile.  If patient bleeds again will need IR embolization for GI ulcer and consider doppler US.   Recent PEA arrest - stable, asymptomatic  In the setting of acute systolic heart failure. EF 30-35%. Will resume Losartan, holding BB in view of low BP and 1st degree AV block. Seems to be compensated at this time. Continue torsemide 40 mg daily.  Continue to monitor.  Recently diagnosed with C. Difficile - resolved  S/p oral vancomycin course ended 8/29  BPH - stable  Continue tamsulosin 0.8 mg qHS   DVT prophylaxis: Heparin ggt  Code Status: Full Code  Family Communication: None at bedside  Disposition Plan: SNF in 24-48 hrs if no further signs of bleeding   Consultants:   GI  PCCM  Procedures:   EGD   Antimicrobials: Anti-infectives (From admission, onward)   Start     Dose/Rate Route Frequency Ordered Stop   08/22/18 0000  vancomycin (VANCOCIN) 50 mg/mL oral solution     125 mg Oral 4 times daily 08/22/18 1323     08/19/18 1930  vancomycin (VANCOCIN) 50 mg/mL oral solution 125  mg     125 mg Oral 4 times daily 08/19/18 1812 08/29/18 1759       Objective: Vitals:   09/03/18 0035 09/03/18 0432 09/03/18 0434 09/03/18 1230  BP: 111/63 129/62  (!) 114/59  Pulse: 80 88  88  Resp: 16 18  18   Temp: 98.1 F (36.7 C) 98.4 F (36.9 C)    TempSrc: Oral Oral    SpO2: 99% 98%  99%  Weight:   (!) 138.7 kg   Height:        Intake/Output Summary (Last 24 hours) at 09/03/2018 1417 Last data filed at  09/03/2018 1228 Gross per 24 hour  Intake 700.14 ml  Output 2050 ml  Net -1349.86 ml   Filed Weights   09/01/18 0415 09/02/18 0335 09/03/18 0434  Weight: (!) 142 kg (!) 139.9 kg (!) 138.7 kg    Examination:  General: Pt is alert, awake, not in acute distress Cardiovascular: RRR, S1/S2 +, no rubs, no gallops Respiratory: Breath sounds diminished b/l, no wheezing or crackles  Abdominal: Soft, NT, ND, bowel sounds + Extremities: b/l LE dressing in place, dry and intact. Hyperkeratosis of the toes.   Data Reviewed: I have personally reviewed following labs and imaging studies  CBC: Recent Labs  Lab 08/30/18 0430 08/30/18 1409 08/31/18 0348 08/31/18 1600 09/01/18 0458 09/02/18 0337 09/03/18 0450 09/03/18 1253  WBC 11.1* 12.2* 10.4  --  10.4 11.6* 9.3 9.3  NEUTROABS 8.2* 9.6* 7.3  --   --   --   --  6.7  HGB 8.8* 9.5* 8.6* 8.7* 8.7* 8.0* 7.9* 7.9*  HCT 28.2* 30.3* 27.4* 27.9* 28.6* 25.8* 25.8* 25.3*  MCV 94.6 93.8 93.8  --  94.7 94.5 93.8 93.7  PLT 130* 140* 135*  --  142* 136* 158 956   Basic Metabolic Panel: Recent Labs  Lab 08/30/18 0430 08/31/18 0348 09/01/18 0458 09/03/18 0450 09/03/18 1253  NA 149* 134* 140 139 142  K 3.9 3.7 4.1 4.1 3.8  CL 114* 98 104 98 100  CO2 31 32 31 33* 35*  GLUCOSE 90 405* 107* 116* 166*  BUN 13 13 11 21  25*  CREATININE 0.86 0.85 0.71 0.97 1.05  CALCIUM 8.1* 7.6* 8.1* 8.1* 8.1*  MG  --   --  1.7  --  1.5*   GFR: Estimated Creatinine Clearance: 83.7 mL/min (by C-G formula based on SCr of 1.05 mg/dL). Liver Function Tests: Recent Labs  Lab 08/28/18 0525 08/30/18 0430 09/01/18 0458  AST 10* 9* 10*  ALT 7 8 7   ALKPHOS 75 73 84  BILITOT 0.7 0.6 0.5  PROT 5.5* 5.3* 5.6*  ALBUMIN 2.2* 2.0* 2.0*   No results for input(s): LIPASE, AMYLASE in the last 168 hours. No results for input(s): AMMONIA in the last 168 hours. Coagulation Profile: Recent Labs  Lab 09/02/18 1405  INR 1.03   Cardiac Enzymes: No results for input(s):  CKTOTAL, CKMB, CKMBINDEX, TROPONINI in the last 168 hours. BNP (last 3 results) No results for input(s): PROBNP in the last 8760 hours. HbA1C: No results for input(s): HGBA1C in the last 72 hours. CBG: Recent Labs  Lab 09/02/18 1555 09/02/18 2142 09/03/18 0038 09/03/18 0435 09/03/18 0804  GLUCAP 151* 111* 111* 104* 118*   Lipid Profile: No results for input(s): CHOL, HDL, LDLCALC, TRIG, CHOLHDL, LDLDIRECT in the last 72 hours. Thyroid Function Tests: No results for input(s): TSH, T4TOTAL, FREET4, T3FREE, THYROIDAB in the last 72 hours. Anemia Panel: No results for input(s): VITAMINB12, FOLATE, FERRITIN,  TIBC, IRON, RETICCTPCT in the last 72 hours. Sepsis Labs: Recent Labs  Lab 09/01/18 1128 09/02/18 0337 09/03/18 0450  PROCALCITON <0.10 <0.10 <0.10    No results found for this or any previous visit (from the past 240 hour(s)).    Radiology Studies: Ct Angio Chest Pe W Or Wo Contrast  Result Date: 09/02/2018 CLINICAL DATA:  Shortness of breath.  Hypoxia. EXAM: CT ANGIOGRAPHY CHEST WITH CONTRAST TECHNIQUE: Multidetector CT imaging of the chest was performed using the standard protocol during bolus administration of intravenous contrast. Multiplanar CT image reconstructions and MIPs were obtained to evaluate the vascular anatomy. CONTRAST:  18mL ISOVUE-370 IOPAMIDOL (ISOVUE-370) INJECTION 76% COMPARISON:  None. FINDINGS: Cardiovascular: Satisfactory opacification of the pulmonary arteries to the segmental level. Pulmonary emboli in the segmental branches of the left upper lobe. Normal heart size. No pericardial effusion. Ascending thoracic aorta measures 4.1 cm in transverse diameter. Mediastinum/Nodes: No enlarged mediastinal, hilar, or axillary lymph nodes. Thyroid gland, trachea, and esophagus demonstrate no significant findings. Lungs/Pleura: Trace left pleural effusion. Left basilar atelectasis. Mild right basilar atelectasis. No pneumothorax. Upper Abdomen: No acute upper  abdominal abnormality. Musculoskeletal: No acute osseous abnormality. No aggressive osseous lesion. Review of the MIP images confirms the above findings. IMPRESSION: 1. Pulmonary emboli in the segmental branches of the left upper lobe. 2. Bibasilar atelectasis, left greater than right. Critical Value/emergent results were called by telephone at the time of interpretation on 09/02/2018 at 12:21 pm to Dr. Doreatha Lew , who verbally acknowledged these results. Electronically Signed   By: Kathreen Devoid   On: 09/02/2018 12:27    Scheduled Meds: . chlorhexidine  15 mL Mouth Rinse BID  . Chlorhexidine Gluconate Cloth  6 each Topical Q0600  . fluticasone  2 spray Each Nare Daily  . insulin aspart  0-9 Units Subcutaneous Q4H  . latanoprost  1 drop Both Eyes QHS  . mouth rinse  15 mL Mouth Rinse q12n4p  . metoprolol succinate  12.5 mg Oral Daily  . multivitamin with minerals  1 tablet Oral Daily  . nutrition supplement (JUVEN)  1 packet Oral BID BM  . pantoprazole  40 mg Oral BID AC  . pravastatin  40 mg Oral Daily  . sodium chloride flush  10-40 mL Intracatheter Q12H  . sucralfate  1 g Oral TID WC & HS  . tamsulosin  0.8 mg Oral Daily  . torsemide  40 mg Oral Daily   Continuous Infusions: . heparin 1,350 Units/hr (09/03/18 0606)     LOS: 15 days    Time spent: Total of 25 minutes spent with pt, greater than 50% of which was spent in discussion of  treatment, counseling and coordination of care   Chipper Oman, MD Pager: Text Page via [email or web address]   If 7PM-7AM, please contact night-coverage [email or web address] 09/03/2018, 2:17 PM   Note - This record has been created using [employer]. Chart creation errors have been sought, but may not always have been located. Such creation errors do not reflect on the standard of medical care.

## 2018-09-04 LAB — RETICULOCYTES
RBC.: 2.64 MIL/uL — ABNORMAL LOW (ref 4.22–5.81)
RETIC CT PCT: 2.7 % (ref 0.4–3.1)
Retic Count, Absolute: 71.3 10*3/uL (ref 19.0–186.0)

## 2018-09-04 LAB — CBC
HEMATOCRIT: 25 % — AB (ref 39.0–52.0)
HEMOGLOBIN: 7.8 g/dL — AB (ref 13.0–17.0)
MCH: 29.3 pg (ref 26.0–34.0)
MCHC: 31.2 g/dL (ref 30.0–36.0)
MCV: 94 fL (ref 78.0–100.0)
Platelets: 193 10*3/uL (ref 150–400)
RBC: 2.66 MIL/uL — ABNORMAL LOW (ref 4.22–5.81)
RDW: 16.2 % — ABNORMAL HIGH (ref 11.5–15.5)
WBC: 9.8 10*3/uL (ref 4.0–10.5)

## 2018-09-04 LAB — GLUCOSE, CAPILLARY
Glucose-Capillary: 113 mg/dL — ABNORMAL HIGH (ref 70–99)
Glucose-Capillary: 114 mg/dL — ABNORMAL HIGH (ref 70–99)
Glucose-Capillary: 138 mg/dL — ABNORMAL HIGH (ref 70–99)
Glucose-Capillary: 140 mg/dL — ABNORMAL HIGH (ref 70–99)
Glucose-Capillary: 171 mg/dL — ABNORMAL HIGH (ref 70–99)
Glucose-Capillary: 173 mg/dL — ABNORMAL HIGH (ref 70–99)

## 2018-09-04 LAB — IRON AND TIBC
IRON: 39 ug/dL — AB (ref 45–182)
Saturation Ratios: 24 % (ref 17.9–39.5)
TIBC: 160 ug/dL — AB (ref 250–450)
UIBC: 121 ug/dL

## 2018-09-04 LAB — VITAMIN B12: Vitamin B-12: 455 pg/mL (ref 180–914)

## 2018-09-04 LAB — HEPARIN LEVEL (UNFRACTIONATED)
HEPARIN UNFRACTIONATED: 0.43 [IU]/mL (ref 0.30–0.70)
Heparin Unfractionated: 0.4 IU/mL (ref 0.30–0.70)

## 2018-09-04 LAB — FOLATE: Folate: 5 ng/mL — ABNORMAL LOW (ref >5.9)

## 2018-09-04 LAB — FERRITIN: FERRITIN: 236 ng/mL (ref 24–336)

## 2018-09-04 MED ORDER — APIXABAN 5 MG PO TABS: 5.0000 mg | ORAL_TABLET | Freq: Two times a day (BID) | ORAL | Status: DC

## 2018-09-04 MED ORDER — APIXABAN 5 MG PO TABS
10.0000 mg | ORAL_TABLET | Freq: Two times a day (BID) | ORAL | Status: DC
  Administered 2018-09-04: 10 mg via ORAL
  Filled 2018-09-04 (×2): qty 2
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
## 2018-09-04 NOTE — Discharge Instructions (Addendum)
Information on my medicine - ELIQUIS® (apixaban) ° °This medication education was reviewed with me or my healthcare representative as part of my discharge preparation. ° °Why was Eliquis® prescribed for you? °Eliquis® was prescribed to treat blood clots that may have been found in the veins of your legs (deep vein thrombosis) or in your lungs (pulmonary embolism) and to reduce the risk of them occurring again. ° °What do You need to know about Eliquis® ? °The dose is ONE 5 mg tablet taken TWICE daily.  Eliquis® may be taken with or without food.  ° °Try to take the dose about the same time in the morning and in the evening. If you have difficulty swallowing the tablet whole please discuss with your pharmacist how to take the medication safely. ° °Take Eliquis® exactly as prescribed and DO NOT stop taking Eliquis® without talking to the doctor who prescribed the medication.  Stopping may increase your risk of developing a new blood clot.  Refill your prescription before you run out. ° °After discharge, you should have regular check-up appointments with your healthcare provider that is prescribing your Eliquis®. °   °What do you do if you miss a dose? °If a dose of ELIQUIS® is not taken at the scheduled time, take it as soon as possible on the same day and twice-daily administration should be resumed. The dose should not be doubled to make up for a missed dose. ° °Important Safety Information °A possible side effect of Eliquis® is bleeding. You should call your healthcare provider right away if you experience any of the following: °? Bleeding from an injury or your nose that does not stop. °? Unusual colored urine (red or dark [place]) or unusual colored stools (red or black). °? Unusual bruising for unknown reasons. °? A serious fall or if you hit your head (even if there is no bleeding). ° °Some medicines may interact with Eliquis® and might increase your risk of bleeding or clotting while on Eliquis®. To help avoid  this, consult your healthcare provider or pharmacist prior to using any new prescription or non-prescription medications, including herbals, vitamins, non-steroidal anti-inflammatory drugs (NSAIDs) and supplements. ° °This website has more information on Eliquis® (apixaban): http://www.eliquis.com/eliquis/home ° °

## 2018-09-04 NOTE — Progress Notes (Signed)
Pt. Refuses to wear CPAP at this time.  Will be available if patient changes his mind.

## 2018-09-04 NOTE — Progress Notes (Signed)
Physical Therapy Treatment Patient Details Name: [name] MRN: [id number] DOB: 02/16/1941 Today's Date: 09/04/2018    History of Present Illness anemia, GI bleed (several recent hospital admits)     PT Comments    Pt in bed on 3 lts nasal soaked in urine as external foley was leaking.  Assisted to EOB required much effort.  Assisted with hygiene upper body.  Static sitting EOB x 12 min. With mild dyspnea avg HR 96 and sats 100%.  Attempted sit to stand however pt was too weak to clear hips off bed despite + 2 assist.  Assisted pt from bed to drop arm recliner via lateral scooting + 2 total Assist pt <10% self assist. Positioned in recliner to comfort and placed a MAXI move pad under pt for nursing to use lift to assist pt back to bed.   Pt is familiar from prior admits.  Before he was able to transfer self to his Power Chair using B bariatric crutches.  Significant mobility decline as pt is currently unable to even stand due to weakness.  Recommend MAXI MOVE for OOB transfers.    Follow Up Recommendations  SNF     Equipment Recommendations  None recommended by PT    Recommendations for Other Services       Precautions / Restrictions Precautions Precautions: Fall Precaution Comments: monitor sats, O2 Restrictions Weight Bearing Restrictions: No    Mobility  Bed Mobility Overal bed mobility: Needs Assistance Bed Mobility: Rolling;Supine to Sit Rolling: Max assist;+2 for physical assistance   Supine to sit: Max assist;Total assist;+2 for physical assistance     General bed mobility comments: assisted with rolling side to side for hygiene and supine to side with heavy need for rail and use of bed pad to complete scooting to EOB.    Transfers Overall transfer level: Needs assistance Equipment used: Rolling walker (2 wheeled) Transfers: Sit to/from Stand;Lateral/Scoot Transfers Sit to Stand: Total assist;+2 physical assistance;+2 safety/equipment        Lateral/Scoot  Transfers: Total assist;+2 physical assistance;+2 safety/equipment General transfer comment: attempted sit to stand however pt unable to clear hips off bed enough c/o MAX weakness stating he has not been up on his feet in weeks.  Prior pt was able to transfer to his power chair using b crutches.    Ambulation/Gait             General Gait Details: unable   Stairs             Wheelchair Mobility    Modified Rankin (Stroke Patients Only)       Balance                                            Cognition Arousal/Alertness: Awake/alert Behavior During Therapy: WFL for tasks assessed/performed Overall Cognitive Status: Within Functional Limits for tasks assessed                                 General Comments: very pleasant       Exercises      General Comments        Pertinent Vitals/Pain Pain Assessment: No/denies pain    Home Living                      Prior  Function            PT Goals (current goals can now be found in the care plan section) Progress towards PT goals: Progressing toward goals    Frequency    Min 2X/week      PT Plan Current plan remains appropriate    Co-evaluation              AM-PAC PT "6 Clicks" Daily Activity  Outcome Measure  Difficulty turning over in bed (including adjusting bedclothes, sheets and blankets)?: Unable Difficulty moving from lying on back to sitting on the side of the bed? : Unable Difficulty sitting down on and standing up from a chair with arms (e.g., wheelchair, bedside commode, etc,.)?: Unable Help needed moving to and from a bed to chair (including a wheelchair)?: Total Help needed walking in hospital room?: Total Help needed climbing 3-5 steps with a railing? : Total 6 Click Score: 6    End of Session Equipment Utilized During Treatment: Oxygen Activity Tolerance: Patient limited by fatigue Patient left: in chair;with call bell/phone within  reach Nurse Communication: Need for lift equipment PT Visit Diagnosis: Muscle weakness (generalized) (M62.81)     Time: 1586-8257 PT Time Calculation (min) (ACUTE ONLY): 48 min  Charges:  $Therapeutic Activity: 38-52 mins                     {Lethia Donlon  PTA [facility]  Acute  Rehab Pager      [phone]

## 2018-09-04 NOTE — Progress Notes (Signed)
[place] for Heparin Indication: pulmonary embolus  Allergies  Allergen Reactions  . Ace Inhibitors Cough    With Lisinopril.  . Angiotensin Receptor Blockers Other (See Comments)    Caused excessive weight gain  . Metformin Diarrhea    Patient Measurements: Height: 6' (182.9 cm) Weight: (!) 305 lb 12.5 oz (138.7 kg) IBW/kg (Calculated) : 77.6 Heparin Dosing Weight: 110kg  Vital Signs: Temp: 98 F (36.7 C) (09/05 1702) Temp Source: Oral (09/05 1702) BP: 109/60 (09/05 2004) Pulse Rate: 82 (09/05 2004)  Labs: Recent Labs    09/01/18 0458 09/02/18 0337 09/02/18 1405  09/03/18 0450 09/03/18 1253 09/03/18 1436 09/03/18 2354  HGB 8.7* 8.0*  --   --  7.9* 7.9*  --   --   HCT 28.6* 25.8*  --   --  25.8* 25.3*  --   --   PLT 142* 136*  --   --  158 174  --   --   APTT  --   --  36  --   --   --   --   --   LABPROT  --   --  13.4  --   --   --   --   --   INR  --   --  1.03  --   --   --   --   --   HEPARINUNFRC  --   --   --    < > 0.58  --  0.80* 0.43  CREATININE 0.71  --   --   --  0.97 1.05  --   --    < > = values in this interval not displayed.    Estimated Creatinine Clearance: 83.7 mL/min (by C-G formula based on SCr of 1.05 mg/dL).  Medications:  Scheduled:  . chlorhexidine  15 mL Mouth Rinse BID  . Chlorhexidine Gluconate Cloth  6 each Topical Q0600  . fluticasone  2 spray Each Nare Daily  . insulin aspart  0-9 Units Subcutaneous Q4H  . latanoprost  1 drop Both Eyes QHS  . losartan  25 mg Oral Daily  . mouth rinse  15 mL Mouth Rinse q12n4p  . metoprolol succinate  12.5 mg Oral Daily  . multivitamin with minerals  1 tablet Oral Daily  . nutrition supplement (JUVEN)  1 packet Oral BID BM  . pantoprazole  40 mg Oral BID AC  . pravastatin  40 mg Oral Daily  . sodium chloride flush  10-40 mL Intracatheter Q12H  . sucralfate  1 g Oral TID WC & HS  . tamsulosin  0.8 mg Oral Daily  . torsemide  40 mg Oral Daily    Infusions:  . heparin 1,000 Units/hr (09/03/18 1638)   PRN: sodium chloride flush  Assessment: 77 yo male admitted with upper GI bleed s/p EGD 8/29 that showed large duodenal bulb ulcer with visible vessel status post epi injection and gold probe cautery. PMH includes chronic systolic and diastolic heart failure, recent treatment for C. difficile colitis, hyperlipidemia, BPH.  CTa on 9/4 shows PE in segmental branches of the left upper lobe. Pharmacy is consulted to dose IV heparin.   Baseline INR, aPTT: WNL  Prior anticoagulation: none  Significant events:  9/5  CBC: low but stable ~8; Plt improved to WNL  Most recent heparin level supratherapeutic and significantly higher than previous level despite rate decrease from 1400 to 1350 units/hr. Per RN, lab draw appears to be appropriate (  heparin through R PICC, lab draw from L hand)  No bleeding or infusion issues per nursing  CrCl: 84 ml/min  2354 HL= 0.43 at goal, no bleeding or infusion issues per RN.   Goal of Therapy: Heparin level 0.3-0.7 units/ml (lower goal, d/t recent GI bleed) Monitor platelets by anticoagulation protocol: Yes  Plan:  Continue heparin drip at 1000 units/hr  Recheck HL in 8 hours to ensure stays in therapeutic range  Daily CBC, daily heparin level once stable  Monitor for signs of bleeding or thrombosis    Dorrene German 09/04/2018, 12:47 AM

## 2018-09-04 NOTE — Progress Notes (Signed)
PROGRESS NOTE [facility]   [name]   XMI:[id number] DOB: [birth year]  DOA: 08/19/2018 PCP: Clarence Bing, DO   Brief Narrative:  [name] 77 y/o M with PMHx of 77 year old male with past medical history significant for chronic systolic and diastolic heart failure, hyperlipidemia, BPH and recently treated for C. difficile colitis presented to the emergency department on 8/21 complaining of black tarry stools and hypotension.  Patient was subsequently found to be hypoxic, anemic with hemoglobin of 6.7.  Patient was transfused and scheduled for outpatient GI work-up however on 8/24 patient declined euvolemic shock and was transferred to the ICU on 08/27/2018, patient underwent EGD which showed large duodenal bulb ulcer with visible vessel and was treated with epi injection and gold probe cautery.  Patient has remained hypoxic with no significant cause.   Subjective: No complaint today, breathing continues to improve. No signs o  Assessment & Plan: Acute respiratory failure with hypoxia due to PE  Chest CTA + for PE. Chest x-ray with no evidence of infectious process however does show some atelectasis.  Continue oxygen supplementation as needed and wean as able. Treat underlying causes.   Upper GI bleed Due to bleeding vessel at the duodenal bulb ulcer. Patient has been transfused total of 8 units of PRBCs during hospital stay.  Underwent EGD and ulcer was treated with epi injection and gold probe cautery.  No signs of overt/recurrent bleeding.  Hemoglobin stable. Continue PP! 40 mg BID, Carafate QID. Follow up with GI as outpatient.    New Acute PE  CTA shows pulmonary emboli in the segmental branches of the left upper lobe.  Unclear etiology at this time.  Patient sedentary with venous insufficiency.  Will not check lower extremity Doppler ultrasound at this time as patient has multiple wounds on bilateral lower extremity.  Patient is being treated with heparin ggt. given  recent episode of GI bleed, no sign of GI bleed hgb remains stable. Ok to switch to Eliquis today, if no signs of bleeding will d/c to SNF in AM. If patient bleeds again will need IR embolization for GI ulcer and consider LE doppler US for possible IVC filter.   Recent PEA arrest - stable, asymptomatic  In the setting of acute systolic heart failure. EF 30-35%. Will resume Losartan, holding BB in view of low BP and 1st degree AV block. Seems to be compensated at this time. Continue torsemide 40 mg daily.  Continue to monitor.  Recently diagnosed with C. Difficile - resolved  S/p oral vancomycin course ended 8/29  BPH - stable  Continue tamsulosin 0.8 mg qHS .  Goals of care  Palliative recommendations appreciated   DVT prophylaxis: Heparin ggt  Code Status: Partial  Family Communication: None at bedside  Disposition Plan: SNF in AM   Consultants:   GI  PCCM  Procedures:   EGD   Antimicrobials: Anti-infectives (From admission, onward)   Start     Dose/Rate Route Frequency Ordered Stop   08/22/18 0000  vancomycin (VANCOCIN) 50 mg/mL oral solution     125 mg Oral 4 times daily 08/22/18 1323     08/19/18 1930  vancomycin (VANCOCIN) 50 mg/mL oral solution 125 mg     125 mg Oral 4 times daily 08/19/18 1812 08/29/18 1759       Objective: Vitals:   09/04/18 0446 09/04/18 0700 09/04/18 0812 09/04/18 1347  BP: 97/69   94/61  Pulse: 84   89  Resp:    Marland Kitchen)  26  Temp:   97.8 F (36.6 C) 98.1 F (36.7 C)  TempSrc:   Oral Oral  SpO2:    (!) 27%  Weight:  (!) 137.5 kg    Height:        Intake/Output Summary (Last 24 hours) at 09/04/2018 1625 Last data filed at 09/04/2018 0600 Gross per 24 hour  Intake 158.48 ml  Output 400 ml  Net -241.52 ml   Filed Weights   09/02/18 0335 09/03/18 0434 09/04/18 0700  Weight: (!) 139.9 kg (!) 138.7 kg (!) 137.5 kg    Examination:  General: Pt is alert, awake, not in acute distress Cardiovascular: RRR, S1/S2 +, no rubs, no  gallops Respiratory: Breath sounds diminished b/l, no wheezing or crackles  Abdominal: Soft, NT, ND, bowel sounds + Extremities: b/l LE dressing in place, dry and intact. Hyperkeratosis of the toes.   Data Reviewed: I have personally reviewed following labs and imaging studies  CBC: Recent Labs  Lab 08/30/18 0430 08/30/18 1409 08/31/18 0348  09/01/18 0458 09/02/18 0337 09/03/18 0450 09/03/18 1253 09/04/18 0416  WBC 11.1* 12.2* 10.4  --  10.4 11.6* 9.3 9.3 9.8  NEUTROABS 8.2* 9.6* 7.3  --   --   --   --  6.7  --   HGB 8.8* 9.5* 8.6*   < > 8.7* 8.0* 7.9* 7.9* 7.8*  HCT 28.2* 30.3* 27.4*   < > 28.6* 25.8* 25.8* 25.3* 25.0*  MCV 94.6 93.8 93.8  --  94.7 94.5 93.8 93.7 94.0  PLT 130* 140* 135*  --  142* 136* 158 174 193   < > = values in this interval not displayed.   Basic Metabolic Panel: Recent Labs  Lab 08/30/18 0430 08/31/18 0348 09/01/18 0458 09/03/18 0450 09/03/18 1253  NA 149* 134* 140 139 142  K 3.9 3.7 4.1 4.1 3.8  CL 114* 98 104 98 100  CO2 31 32 31 33* 35*  GLUCOSE 90 405* 107* 116* 166*  BUN 13 13 11 21  25*  CREATININE 0.86 0.85 0.71 0.97 1.05  CALCIUM 8.1* 7.6* 8.1* 8.1* 8.1*  MG  --   --  1.7  --  1.5*   GFR: Estimated Creatinine Clearance: 83.3 mL/min (by C-G formula based on SCr of 1.05 mg/dL). Liver Function Tests: Recent Labs  Lab 08/30/18 0430 09/01/18 0458  AST 9* 10*  ALT 8 7  ALKPHOS 73 84  BILITOT 0.6 0.5  PROT 5.3* 5.6*  ALBUMIN 2.0* 2.0*   No results for input(s): LIPASE, AMYLASE in the last 168 hours. No results for input(s): AMMONIA in the last 168 hours. Coagulation Profile: Recent Labs  Lab 09/02/18 1405  INR 1.03   Cardiac Enzymes: No results for input(s): CKTOTAL, CKMB, CKMBINDEX, TROPONINI in the last 168 hours. BNP (last 3 results) No results for input(s): PROBNP in the last 8760 hours. HbA1C: No results for input(s): HGBA1C in the last 72 hours. CBG: Recent Labs  Lab 09/03/18 2010 09/04/18 0000 09/04/18 0411  09/04/18 0802 09/04/18 1155  GLUCAP 79 138* 113* 114* 173*   Lipid Profile: No results for input(s): CHOL, HDL, LDLCALC, TRIG, CHOLHDL, LDLDIRECT in the last 72 hours. Thyroid Function Tests: No results for input(s): TSH, T4TOTAL, FREET4, T3FREE, THYROIDAB in the last 72 hours. Anemia Panel: Recent Labs    09/04/18 0944  VITAMINB12 455  FOLATE 5.0*  FERRITIN 236  TIBC 160*  IRON 39*  RETICCTPCT 2.7   Sepsis Labs: Recent Labs  Lab 09/01/18 1128 09/02/18 0337 09/03/18 0450  PROCALCITON <0.10 <0.10 <0.10    No results found for this or any previous visit (from the past 240 hour(s)).    Radiology Studies: No results found.  Scheduled Meds: . apixaban  10 mg Oral BID   Followed by  . [START ON 09/12/2018] apixaban  5 mg Oral BID  . chlorhexidine  15 mL Mouth Rinse BID  . Chlorhexidine Gluconate Cloth  6 each Topical Q0600  . fluticasone  2 spray Each Nare Daily  . insulin aspart  0-9 Units Subcutaneous Q4H  . latanoprost  1 drop Both Eyes QHS  . losartan  25 mg Oral Daily  . mouth rinse  15 mL Mouth Rinse q12n4p  . metoprolol succinate  12.5 mg Oral Daily  . multivitamin with minerals  1 tablet Oral Daily  . nutrition supplement (JUVEN)  1 packet Oral BID BM  . pantoprazole  40 mg Oral BID AC  . pravastatin  40 mg Oral Daily  . sodium chloride flush  10-40 mL Intracatheter Q12H  . sucralfate  1 g Oral TID WC & HS  . tamsulosin  0.8 mg Oral Daily  . torsemide  40 mg Oral Daily   Continuous Infusions: . heparin 1,000 Units/hr (09/04/18 0300)     LOS: 16 days    Time spent: Total of 15 minutes spent with pt, greater than 50% of which was spent in discussion of  treatment, counseling and coordination of care   Chipper Oman, MD Pager: Text Page via [email or web address]   If 7PM-7AM, please contact night-coverage [email or web address] 09/04/2018, 4:25 PM   Note - This record has been created using [employer]. Chart creation errors have been sought, but may not always  have been located. Such creation errors do not reflect on the standard of medical care.

## 2018-09-04 NOTE — Progress Notes (Addendum)
ANTICOAGULATION CONSULT NOTE   Pharmacy Consult for heparin Indication: acute pulmonary embolus  Allergies  Allergen Reactions  . Ace Inhibitors Cough    With Lisinopril.  . Angiotensin Receptor Blockers Other (See Comments)    Caused excessive weight gain  . Metformin Diarrhea    Patient Measurements: Height: 6' (182.9 cm) Weight: (!) 303 lb 1.6 oz (137.5 kg) IBW/kg (Calculated) : 77.6 Heparin Dosing Weight: 110 kg  Vital Signs: Temp: 97.8 F (36.6 C) (09/06 0812) Temp Source: Oral (09/06 0812) BP: 97/69 (09/06 0446) Pulse Rate: 84 (09/06 0446)  Labs: Recent Labs    09/02/18 1405  09/03/18 0450 09/03/18 1253 09/03/18 1436 09/03/18 2354 09/04/18 0416 09/04/18 0756  HGB  --   --  7.9* 7.9*  --   --  7.8*  --   HCT  --   --  25.8* 25.3*  --   --  25.0*  --   PLT  --   --  158 174  --   --  193  --   APTT 36  --   --   --   --   --   --   --   LABPROT 13.4  --   --   --   --   --   --   --   INR 1.03  --   --   --   --   --   --   --   HEPARINUNFRC  --    < > 0.58  --  0.80* 0.43  --  0.40  CREATININE  --   --  0.97 1.05  --   --   --   --    < > = values in this interval not displayed.    Estimated Creatinine Clearance: 83.3 mL/min (by C-G formula based on SCr of 1.05 mg/dL).   Assessment: Patient's a 77 y.o M presented to the ED on 08/20/18 with c/o rectal bleeding and melena.  Patient refused endoscopic evaluation and GI signed off on 8/23.  He eventually agreed to EGD and this was performed on 8/29.  EGD showed large duodenal bulb ulcer with evidence of recent bleeding.  Ulcer was treated with epinephrine injection, gold probe cautery, and Hemo spray. He developed SOB with chest CTA on 09/02/18 positive for new PE in the LUL.  Heparin drip started on 9/4 for acute PE.  Today, 09/04/2018: - heparin level remains therapeutic at 0.40 - hgb low but stable, plts stable - no new active bleeding documented - MD's note on 9/5 indicated that plan is to transition to  Eliquis  Goal of Therapy:  Heparin level 0.3-0.5 units/ml Monitor platelets by anticoagulation protocol: Yes   Plan:  - continue heparin drip at 1000 units/hr - monitor for s/s bleeding  Valina Maes P 09/04/2018,9:11 AM   __________________________________  Adden (09/04/18 at 1353) - To transition patient to Eliquis - d/c heparin drip  - start Eliquis 10 mg bid for 7 days, then 5 mg bid  Dia Sitter, PharmD, BCPS 09/04/2018 1:54 PM

## 2018-09-05 ENCOUNTER — Encounter (HOSPITAL_COMMUNITY): Payer: Self-pay | Admitting: Interventional Radiology

## 2018-09-05 ENCOUNTER — Inpatient Hospital Stay (HOSPITAL_COMMUNITY): Payer: PPO

## 2018-09-05 DIAGNOSIS — K269 Duodenal ulcer, unspecified as acute or chronic, without hemorrhage or perforation: Secondary | ICD-10-CM

## 2018-09-05 DIAGNOSIS — J9601 Acute respiratory failure with hypoxia: Secondary | ICD-10-CM

## 2018-09-05 DIAGNOSIS — N179 Acute kidney failure, unspecified: Secondary | ICD-10-CM

## 2018-09-05 DIAGNOSIS — I2699 Other pulmonary embolism without acute cor pulmonale: Secondary | ICD-10-CM

## 2018-09-05 HISTORY — PX: IR EMBO ART  VEN HEMORR LYMPH EXTRAV  INC GUIDE ROADMAPPING: IMG5450

## 2018-09-05 HISTORY — PX: IR US GUIDE VASC ACCESS RIGHT: IMG2390

## 2018-09-05 HISTORY — PX: IR ANGIOGRAM SELECTIVE EACH ADDITIONAL VESSEL: IMG667

## 2018-09-05 HISTORY — PX: IR ANGIOGRAM VISCERAL SELECTIVE: IMG657

## 2018-09-05 LAB — CBC
HCT: 20 % — ABNORMAL LOW (ref 39.0–52.0)
HCT: 28.5 % — ABNORMAL LOW (ref 39.0–52.0)
HCT: 27 % — ABNORMAL LOW (ref 39.0–52.0)
HEMATOCRIT: 25.5 % — AB (ref 39.0–52.0)
HEMOGLOBIN: 9.4 g/dL — AB (ref 13.0–17.0)
HEMOGLOBIN: 8.5 g/dL — AB (ref 13.0–17.0)
Hemoglobin: 6.2 g/dL — CL (ref 13.0–17.0)
Hemoglobin: 8.8 g/dL — ABNORMAL LOW (ref 13.0–17.0)
MCH: 29.1 pg (ref 26.0–34.0)
MCH: 29.2 pg (ref 26.0–34.0)
MCH: 29.1 pg (ref 26.0–34.0)
MCH: 29.5 pg (ref 26.0–34.0)
MCHC: 31 g/dL (ref 30.0–36.0)
MCHC: 33 g/dL (ref 30.0–36.0)
MCHC: 32.6 g/dL (ref 30.0–36.0)
MCHC: 33.3 g/dL (ref 30.0–36.0)
MCV: 93.9 fL (ref 78.0–100.0)
MCV: 88.5 fL (ref 78.0–100.0)
MCV: 89.4 fL (ref 78.0–100.0)
MCV: 88.5 fL (ref 78.0–100.0)
PLATELETS: 99 10*3/uL — AB (ref 150–400)
Platelets: 170 10*3/uL (ref 150–400)
Platelets: 99 10*3/uL — ABNORMAL LOW (ref 150–400)
Platelets: 102 10*3/uL — ABNORMAL LOW (ref 150–400)
RBC: 2.13 MIL/uL — ABNORMAL LOW (ref 4.22–5.81)
RBC: 3.22 MIL/uL — AB (ref 4.22–5.81)
RBC: 3.02 MIL/uL — ABNORMAL LOW (ref 4.22–5.81)
RBC: 2.88 MIL/uL — ABNORMAL LOW (ref 4.22–5.81)
RDW: 16 % — ABNORMAL HIGH (ref 11.5–15.5)
RDW: 16.7 % — AB (ref 11.5–15.5)
RDW: 17 % — AB (ref 11.5–15.5)
RDW: 16.9 % — ABNORMAL HIGH (ref 11.5–15.5)
WBC: 9 10*3/uL (ref 4.0–10.5)
WBC: 20.1 10*3/uL — ABNORMAL HIGH (ref 4.0–10.5)
WBC: 17.1 10*3/uL — ABNORMAL HIGH (ref 4.0–10.5)
WBC: 14.9 10*3/uL — AB (ref 4.0–10.5)

## 2018-09-05 LAB — PREPARE RBC (CROSSMATCH)

## 2018-09-05 LAB — TROPONIN I: Troponin I: <0.03 ng/mL (ref <0.03)

## 2018-09-05 LAB — BASIC METABOLIC PANEL
ANION GAP: 10 (ref 5–15)
BUN: 33 mg/dL — ABNORMAL HIGH (ref 8–23)
CALCIUM: 7.4 mg/dL — AB (ref 8.9–10.3)
CHLORIDE: 96 mmol/L — AB (ref 98–111)
CO2: 36 mmol/L — AB (ref 22–32)
CREATININE: 1.28 mg/dL — AB (ref 0.61–1.24)
GFR calc non Af Amer: 52 mL/min — ABNORMAL LOW (ref >60)
Glucose, Bld: 186 mg/dL — ABNORMAL HIGH (ref 70–99)
Potassium: 4 mmol/L (ref 3.5–5.1)
SODIUM: 142 mmol/L (ref 135–145)

## 2018-09-05 LAB — PHOSPHORUS: Phosphorus: 3.3 mg/dL (ref 2.5–4.6)

## 2018-09-05 LAB — GLUCOSE, CAPILLARY
GLUCOSE-CAPILLARY: 100 mg/dL — AB (ref 70–99)
GLUCOSE-CAPILLARY: 120 mg/dL — AB (ref 70–99)
GLUCOSE-CAPILLARY: 182 mg/dL — AB (ref 70–99)
GLUCOSE-CAPILLARY: 232 mg/dL — AB (ref 70–99)
GLUCOSE-CAPILLARY: 88 mg/dL (ref 70–99)
Glucose-Capillary: 126 mg/dL — ABNORMAL HIGH (ref 70–99)
Glucose-Capillary: 140 mg/dL — ABNORMAL HIGH (ref 70–99)

## 2018-09-05 LAB — PROTIME-INR
INR: 1.38
INR: 1.13
Prothrombin Time: 16.8 seconds — ABNORMAL HIGH (ref 11.4–15.2)
Prothrombin Time: 14.4 seconds (ref 11.4–15.2)

## 2018-09-05 LAB — BLOOD GAS, ARTERIAL
Acid-Base Excess: 7.8 mmol/L — ABNORMAL HIGH (ref 0.0–2.0)
Bicarbonate: 34.4 mmol/L — ABNORMAL HIGH (ref 20.0–28.0)
Drawn by: 308601
O2 Content: 3 L/min
O2 Saturation: 98.1 %
PATIENT TEMPERATURE: 98.6
PCO2 ART: 66 mmHg — AB (ref 32.0–48.0)
PH ART: 7.338 — AB (ref 7.350–7.450)
PO2 ART: 104 mmHg (ref 83.0–108.0)

## 2018-09-05 LAB — APTT: aPTT: 40 seconds — ABNORMAL HIGH (ref 24–36)

## 2018-09-05 LAB — FIBRINOGEN: FIBRINOGEN: 322 mg/dL (ref 210–475)

## 2018-09-05 LAB — MAGNESIUM: MAGNESIUM: 1.6 mg/dL — AB (ref 1.7–2.4)

## 2018-09-05 MED ORDER — SODIUM CHLORIDE 0.9 % IV SOLN
80.0000 mg | Freq: Once | INTRAVENOUS | Status: AC
  Administered 2018-09-05: 80 mg via INTRAVENOUS
  Filled 2018-09-05: qty 80

## 2018-09-05 MED ORDER — EMPTY CONTAINERS FLEXIBLE MISC
900.0000 mg | Freq: Once | Status: AC
  Administered 2018-09-05: 900 mg via INTRAVENOUS
  Filled 2018-09-05: qty 90

## 2018-09-05 MED ORDER — METOCLOPRAMIDE HCL 5 MG/ML IJ SOLN
10.0000 mg | Freq: Once | INTRAMUSCULAR | Status: AC
  Administered 2018-09-05: 10 mg via INTRAVENOUS
  Filled 2018-09-05: qty 2

## 2018-09-05 MED ORDER — MAGNESIUM SULFATE 2 GM/50ML IV SOLN
2.0000 g | Freq: Once | INTRAVENOUS | Status: AC
  Administered 2018-09-05: 2 g via INTRAVENOUS
  Filled 2018-09-05: qty 50

## 2018-09-05 MED ORDER — IOPAMIDOL (ISOVUE-370) INJECTION 76%
INTRAVENOUS | Status: AC
  Filled 2018-09-05: qty 100

## 2018-09-05 MED ORDER — SODIUM CHLORIDE 0.9 % IV SOLN
8.0000 mg/h | INTRAVENOUS | Status: AC
  Administered 2018-09-05 – 2018-09-07 (×6): 8 mg/h via INTRAVENOUS
  Filled 2018-09-05 (×13): qty 80

## 2018-09-05 MED ORDER — PANTOPRAZOLE SODIUM 40 MG IV SOLR: 40.0000 mg | Freq: Two times a day (BID) | INTRAVENOUS | Status: DC

## 2018-09-05 MED ORDER — IOHEXOL 300 MG/ML  SOLN
100.0000 mL | Freq: Once | INTRAMUSCULAR | Status: AC | PRN
  Administered 2018-09-05: 50 mL via INTRA_ARTERIAL

## 2018-09-05 MED ORDER — SODIUM CHLORIDE 0.9% IV SOLUTION: Freq: Once | INTRAVENOUS | Status: AC

## 2018-09-05 MED ORDER — SODIUM CHLORIDE 0.9 % IV BOLUS: 1000.0000 mL | Freq: Once | INTRAVENOUS | Status: AC

## 2018-09-05 MED ORDER — VASOPRESSIN 20 UNIT/ML IV SOLN
0.0400 [IU]/min | INTRAVENOUS | Status: DC
  Administered 2018-09-05 (×2): 0.08 [IU]/min via INTRAVENOUS
  Administered 2018-09-06 (×3): 0.2 [IU]/min via INTRAVENOUS
  Administered 2018-09-07: 0.1 [IU]/min via INTRAVENOUS
  Filled 2018-09-05 (×6): qty 5

## 2018-09-05 MED ORDER — ONDANSETRON HCL 4 MG/2ML IJ SOLN
INTRAMUSCULAR | Status: AC
  Filled 2018-09-05: qty 2

## 2018-09-05 MED ORDER — IOHEXOL 300 MG/ML  SOLN: 100.0000 mL | Freq: Once | INTRAMUSCULAR | Status: DC | PRN

## 2018-09-05 MED ORDER — FENTANYL CITRATE (PF) 100 MCG/2ML IJ SOLN
INTRAMUSCULAR | Status: AC | PRN
  Administered 2018-09-05: 50 ug via INTRAVENOUS

## 2018-09-05 MED ORDER — NOREPINEPHRINE 4 MG/250ML-% IV SOLN
0.0000 ug/min | INTRAVENOUS | Status: DC
  Filled 2018-09-05: qty 250

## 2018-09-05 MED ORDER — IOHEXOL 300 MG/ML  SOLN
100.0000 mL | Freq: Once | INTRAMUSCULAR | Status: AC | PRN
  Administered 2018-09-05: 12 mL via INTRA_ARTERIAL

## 2018-09-05 MED ORDER — VITAMIN K1 10 MG/ML IJ SOLN
10.0000 mg | Freq: Once | INTRAVENOUS | Status: AC
  Administered 2018-09-05: 10 mg via INTRAVENOUS
  Filled 2018-09-05: qty 1

## 2018-09-05 MED ORDER — LIDOCAINE HCL 1 % IJ SOLN
INTRAMUSCULAR | Status: AC
  Filled 2018-09-05: qty 20

## 2018-09-05 MED ORDER — SODIUM CHLORIDE 0.9 % IV SOLN
INTRAVENOUS | Status: DC
  Administered 2018-09-05 – 2018-09-11 (×10): via INTRAVENOUS

## 2018-09-05 MED ORDER — ONDANSETRON HCL 4 MG/2ML IJ SOLN
4.0000 mg | Freq: Once | INTRAMUSCULAR | Status: AC
  Administered 2018-09-05: 4 mg via INTRAVENOUS

## 2018-09-05 MED ORDER — MIDAZOLAM HCL 2 MG/2ML IJ SOLN
INTRAMUSCULAR | Status: AC | PRN
  Administered 2018-09-05: 1 mg via INTRAVENOUS

## 2018-09-05 MED ORDER — FENTANYL CITRATE (PF) 100 MCG/2ML IJ SOLN
INTRAMUSCULAR | Status: AC
  Filled 2018-09-05: qty 4

## 2018-09-05 MED ORDER — MIDAZOLAM HCL 2 MG/2ML IJ SOLN
INTRAMUSCULAR | Status: AC
  Filled 2018-09-05: qty 4

## 2018-09-05 MED ORDER — PHENYLEPHRINE HCL-NACL 10-0.9 MG/250ML-% IV SOLN
0.0000 ug/min | INTRAVENOUS | Status: DC
  Administered 2018-09-05: 200 ug/min via INTRAVENOUS
  Administered 2018-09-05 (×2): 400 ug/min via INTRAVENOUS
  Filled 2018-09-05 (×4): qty 250

## 2018-09-05 MED ORDER — SODIUM CHLORIDE 0.9 % IV BOLUS
1000.0000 mL | Freq: Once | INTRAVENOUS | Status: AC
  Administered 2018-09-05: 1000 mL via INTRAVENOUS

## 2018-09-05 MED ORDER — SODIUM CHLORIDE 0.9 % IV SOLN
0.0000 ug/min | INTRAVENOUS | Status: DC
  Administered 2018-09-05 (×2): 400 ug/min via INTRAVENOUS
  Administered 2018-09-05: 115 ug/min via INTRAVENOUS
  Administered 2018-09-05: 275 ug/min via INTRAVENOUS
  Administered 2018-09-05: 200 ug/min via INTRAVENOUS
  Administered 2018-09-05: 100 ug/min via INTRAVENOUS
  Administered 2018-09-06: 80 ug/min via INTRAVENOUS
  Administered 2018-09-07: 20 ug/min via INTRAVENOUS
  Filled 2018-09-05 (×8): qty 4

## 2018-09-05 MED ORDER — INSULIN ASPART 100 UNIT/ML ~~LOC~~ SOLN
0.0000 [IU] | SUBCUTANEOUS | Status: DC
  Administered 2018-09-05 – 2018-09-11 (×12): 3 [IU] via SUBCUTANEOUS
  Administered 2018-09-12: 1 [IU] via SUBCUTANEOUS
  Administered 2018-09-12: 4 [IU] via SUBCUTANEOUS
  Administered 2018-09-12 (×2): 3 [IU] via SUBCUTANEOUS
  Administered 2018-09-13 (×2): 4 [IU] via SUBCUTANEOUS

## 2018-09-05 MED ORDER — SODIUM CHLORIDE 0.9% IV SOLUTION
Freq: Once | INTRAVENOUS | Status: AC
  Administered 2018-09-05: 02:00:00 via INTRAVENOUS

## 2018-09-05 MED FILL — Medication: Qty: 1 | Status: AC

## 2018-09-05 NOTE — Progress Notes (Signed)
RT attempte an ALINE 2 times with no success. RT had a difficult time inserting the needle into the Pt's skin due to the skin being tuff. RT will continue to monitor

## 2018-09-05 NOTE — Progress Notes (Signed)
Called Dr. Brayton Layman about the frquent BM's that are grossly bloody.  Hbg dropped to 8.8 currently.  Patient had a large bloody stool around 1430 with lots of clots and fresh blood present.  Continue to closely monitor patient.  [name] Roselie Awkward RN

## 2018-09-05 NOTE — Procedures (Signed)
Interventional Radiology Procedure Note  Procedure: US guided right CFA access.  Mesenteric angiogram of celiac/CHA, GDA, SMA.  Empiric embolization of the GDA to the origin Deployed Exoseal at right CFA for hemostasis   Complications: None  EBL: none  Recommendations:  - Right leg straight for 6 hours - Do not submerge 7 days - Return to ICU.  Currently on IV pressors support, undergoing fluid/heme resuscitation - Routine wound care at the right hip - Expect further melenic stools, which were ongoing during our angiogram  Signed,  Dulcy Fanny. Earleen Newport, DO

## 2018-09-05 NOTE — Consult Note (Signed)
Chief Complaint: Acute recurrent life-threatening upper GI hemorrhage  Referring Physician(s): Dr. Jimmey Ralph, Pulm/CCM  Supervising Physician: Corrie Mckusick  Patient Status: [name] - [facility]-pt  History of Present Illness: [name] is a 77 y.o. male admitted to [facility] since 8/21 with upper GI hemorrhage.    He presented with melena.  Scope was performed 8/29 by Dr. Alessandra Bevels, with identification of "large duodenal bulb ulcer with visible vessel."  He was treated with epi injection and gold probe cautery, with Hemo spray.    He was never hemodynamically unstable until tonight overnight.  He was initiated yesterday with NOAC Eliquis for PE. Overnight tonight he started vomiting blood, with NG placed and aspirate of ~1000cc of blood from stomach.  He became hypotensive and tachycardic, needing IV pressors, with associated altered mental status briefly, and requires volume/heme resuscitation.   CBC shows decreased hgb, now 6.9.   While I am at the bedside, volume resuscitation continues, with PRBC's, FFP, and NOAC-reversal agent prescribed.  He is tachycardic and hypotensive.   Discussed case at bedside with Dr. Penelope Coop of GI team, and his impression is that further GI intervention may be futile at this moment.      Past Medical History:  Diagnosis Date  . Chronic osteomyelitis of lower leg (HCC) 11/20/2010   Chronic osteoarthritis of right lower leg-10/03/10 note by Dr. Harvie Heck scanned of this visit under media tab. Dr. Sharol Given states that this is chronic osteomyelitis, only cure would be amputation. Patient was not agreeable to this at his visit with Dr. Sharol Given. Patient elected to apply Bactroban cream, Dr. Sharol Given and time of that visit also prescribe doxycycline. Dr. Sharol Given stay the patient she come to the [place] 02/26/2007   Qualifier: Diagnosis of  By: Erling Cruz  MD, [place]    . Complication of anesthesia   . Diabetes mellitus   . Diabetic peripheral neuropathy associated with type 2  diabetes mellitus ([place]) 02/07/2014  . GERD (gastroesophageal reflux disease)   . Grade II diastolic dysfunction   . Heart murmur    last 2D Echo -03/31/08  . HIATAL HERNIA WITH REFLUX 09/29/2006   Qualifier: Diagnosis of  By: Erling Cruz  MD, M[place]    . HIP REPLACEMENT, BILATERAL, HX OF 05/12/2007   Qualifier: Diagnosis of  By: Erling Cruz  MD, MELISSA    . History of blood transfusion   . Hypertension   . OSTEOARTHRITIS 03/05/2007   Qualifier: Diagnosis of  By: Erling Cruz  MD, MELISSA    . PONV (postoperative nausea and vomiting)   . Prostate cancer ([place])   . SBO (small bowel obstruction) ([place]) 02/2017  . Seasonal allergies   . VENOUS INSUFFICIENCY, CHRONIC 02/26/2007   Qualifier: Diagnosis of  By: Erling Cruz  MD, MELISSA      Past Surgical History:  Procedure Laterality Date  . COLONOSCOPY    . COLONOSCOPY    . ESOPHAGOGASTRODUODENOSCOPY (EGD) WITH PROPOFOL N/A 08/27/2018   Procedure: ESOPHAGOGASTRODUODENOSCOPY (EGD) WITH PROPOFOL HEMOSPRAY ;  Surgeon: Otis Brace, MD;  Location: [facility]COPY;  Service: Gastroenterology;  Laterality: N/A;  bedside EGD  . HIP ARTHROPLASTY     bil  . HOT HEMOSTASIS N/A 08/27/2018   Procedure: HOT HEMOSTASIS (ARGON PLASMA COAGULATION/BICAP);  Surgeon: Otis Brace, MD;  Location: [facility];  Service: Gastroenterology;  Laterality: N/A;  bicap  . I&D EXTREMITY  08/14/2012   Procedure: IRRIGATION AND DEBRIDEMENT EXTREMITY;  Surgeon: Newt Minion, MD;  Location: [facility];  Service: Orthopedics;  Laterality: Right;  Irrigation and Debridement Right tibia, Placement antibiotic beads  . I&D EXTREMITY Left 03/18/2014   Procedure: IRRIGATION AND DEBRIDEMENT EXTREMITY;  Surgeon: Newt Minion, MD;  Location: [facility];  Service: Orthopedics;  Laterality: Left;  Debridement Left Calf Ulcer, Apply Theraskin and Wound VAC  . IRRIGATION AND DEBRIDEMENT ABSCESS Left 03/18/2014   DR DUDA  . LEG SURGERY     ROD for fracture  . LEG WOUND REPAIR / CLOSURE     Beads .  Skin wound  . SKIN  GRAFT     right leg- from donor skin  . SKIN GRAFT  1976   R wrist   skin graft from left thigh  . SUBMUCOSAL INJECTION  08/27/2018   Procedure: SUBMUCOSAL INJECTION;  Surgeon: Otis Brace, MD;  Location: [facility];  Service: Gastroenterology;;  . UPPER GASTROINTESTINAL ENDOSCOPY    . WISDOM TOOTH EXTRACTION    . WRIST FUSION     right wrist    Allergies: Ace inhibitors; Angiotensin receptor blockers; and Metformin  Medications: Prior to Admission medications   Medication Sig Start Date End Date Taking? Authorizing Provider  aspirin 81 MG chewable tablet Chew 81 mg by mouth every other day.    Yes [provider]  collagenase (SANTYL) ointment Apply 1 application topically daily.   Yes [provider]  ferrous sulfate 325 (65 FE) MG tablet Take 1 tablet (325 mg total) by mouth 2 (two) times daily with a meal. 08/07/18  Yes Anderson, Chelsey L, DO  FLOVENT HFA 220 MCG/ACT inhaler INHALE 1 PUFF INTO THE LUNGS TWICE DAILY Patient taking differently: Inhale 1 puff into the lungs 2 (two) times daily.  10/02/17  Yes Mikell, Jeani Sow, MD  fluticasone (FLONASE) 50 MCG/ACT nasal spray Place 2 sprays into both nostrils daily.   Yes [provider]  Infant Care Products (DERMACLOUD) CREA Apply 1 application topically 3 (three) times daily.   Yes [provider]  latanoprost (XALATAN) 0.005 % ophthalmic solution INSTILL 1 DROP IN BOTH EYES EVERY EVENING FOR GLAUCOMA Patient taking differently: Place 1 drop into both eyes at bedtime.  07/08/18  Yes [place] Bing, DO  losartan (COZAAR) 25 MG tablet Take 1 tablet (25 mg total) by mouth daily. 08/08/18  Yes Anderson, Chelsey L, DO  metoprolol tartrate (LOPRESSOR) 25 MG tablet Take 0.5 tablets (12.5 mg total) by mouth 2 (two) times daily. 08/07/18  Yes Anderson, Chelsey L, DO  nystatin (MYCOSTATIN/NYSTOP) powder Apply topically 2 (two) times daily. 07/13/18  Yes [place] Bing, DO  pravastatin (PRAVACHOL)  40 MG tablet TAKE 1 TABLET BY MOUTH DAILY 12/26/17  Yes Mikell, Jeani Sow, MD  Probiotic Product (PROBIOTIC PO) Take 2 capsules by mouth daily.   Yes [provider]  tamsulosin (FLOMAX) 0.4 MG CAPS capsule TAKE 2 CAPSULES BY MOUTH EVERY MORNING Patient taking differently: Take 0.8 mg by mouth daily.  07/08/18  Yes [place] Bing, DO  torsemide (DEMADEX) 20 MG tablet Take 2 tablets (40 mg total) by mouth daily. 08/08/18  Yes Anderson, Chelsey L, DO  urea 10 % lotion Apply topically 2 (two) times daily. 08/07/18  Yes Anderson, Chelsey L, DO  Vancomycin HCl (FIRVANQ) 50 MG/ML SOLR Take 2.5 mLs by mouth 4 (four) times daily.   Yes [provider]  famotidine (PEPCID) 20 MG tablet Take 2 tablets (40 mg total) by mouth daily. 08/23/18   Georgette Shell, MD  Hydrocortisone (GERHARDT'S BUTT CREAM) CREA Apply 1 application topically 3 (three) times daily.  Patient not taking: Reported on 08/19/2018 08/07/18   Doristine Mango L, DO  mometasone (NASONEX) 50 MCG/ACT nasal spray INSTILL 2 SPRAYS INTO THE NOSE DAILY Patient not taking: Reported on 08/19/2018 07/08/18   [place] Bing, DO  PROAIR HFA 108 (90 Base) MCG/ACT inhaler INHALE 2 PUFFS BY MOUTH EVERY 4 HOURS AS NEEDED FOR WHEEZING OR SHORTNESS OF BREATH Patient taking differently: Inhale 2 puffs into the lungs every 4 (four) hours as needed for wheezing or shortness of breath.  04/18/18   Mikell, Jeani Sow, MD  traMADol (ULTRAM) 50 MG tablet Take 1 tablet (50 mg total) by mouth every 6 (six) hours as needed for moderate pain or severe pain. 08/22/18   Georgette Shell, MD  vancomycin (VANCOCIN) 50 mg/mL oral solution Take 2.5 mLs (125 mg total) by mouth 4 (four) times daily. 08/22/18   Georgette Shell, MD     Family History  Problem Relation Age of Onset  . Hypertension Mother     Social History   Socioeconomic History  . Marital status: Widowed    Spouse name: Not on file  . Number of children: 4  . Years of  education: 73  . Highest education level: Not on file  Occupational History  . Occupation: retired- Administrator  Social Needs  . Financial resource strain: Not hard at all  . Food insecurity:    Worry: Never true    Inability: Never true  . Transportation needs:    Medical: No    Non-medical: No  Tobacco Use  . Smoking status: Never Smoker  . Smokeless tobacco: Never Used  Substance and Sexual Activity  . Alcohol use: No  . Drug use: No  . Sexual activity: Not on file  Lifestyle  . Physical activity:    Days per week: 0 days    Minutes per session: 0 min  . Stress: Not at all  Relationships  . Social connections:    Talks on phone: More than three times a week    Gets together: More than three times a week    Attends religious service: Never    Active member of club or organization: No    Attends meetings of clubs or organizations: Not on file    Relationship status: Widowed  Other Topics Concern  . Not on file  Social History Narrative   Health Care POA:    Emergency Contact: daughter, Cheri Rous [phone]   End of Life Plan: gave pt AD pamphlet   Who lives with you: self   Any pets: none   Diet: pt has a varied diet of protein, starch and vegetables.    Exercise: Pt does not have regular exercise routine.  We discussed starting a regular chair exercise program.   Seatbelts: Pt reports wearing seatbelt when in vehicles.    Hobbies: watching car races             Review of Systems: A 12 point ROS discussed and pertinent positives are indicated in the HPI above.  All other systems are negative.  Review of Systems  Vital Signs: BP (!) 132/95   Pulse (!) 107   Temp 97.9 F (36.6 C) (Oral)   Resp 18   Ht 6' (1.829 m)   Wt (!) 137.5 kg   SpO2 100%   BMI 41.11 kg/m   Physical Exam General: 77 yo AA male appearing stated age.  No distress and calm in ICU bed.  Marland Kitchen HEENT: Atraumatic, normocephalic.  Conjugate gaze, extra-ocular motor intact. No scleral  icterus or scleral injection.  Pale. NG tube in place with fresh blood aspirate ongoing.   Neck: mobile  Chest/Lungs:  Symmetric chest with inspiration/expiration. tachypnea.    Heart:  Tachy.  Abdomen:  obese Genito-urinary: Deferred Neurologic: Alert & Oriented to person, place, and time.   .    Imaging: Dg Chest 1 View  Result Date: 09/01/2018 CLINICAL DATA:  Shortness of breath. Diabetes and hypertension. Personal history of prostate carcinoma. EXAM: CHEST  1 VIEW COMPARISON:  08/29/2018 FINDINGS: Right arm PICC line remains in appropriate position. Heart size is stable. Mild elevation of left hemidiaphragm again noted. There is persistent atelectasis or infiltrate in the left lung base. Mild increase in atelectasis noted in right lung base. No pneumothorax visualized. IMPRESSION: Left lower lobe atelectasis versus infiltrate, without significant change. Increased mild right basilar atelectasis. Electronically Signed   By: Earle Gell M.D.   On: 09/01/2018 08:40   Dg Abd 1 View  Result Date: 08/30/2018 CLINICAL DATA:  Pt c/o abdominal pain. Duodenal ulcer. H/o Diabetes type 2, prostate cancer, hiatal hernia. EXAM: ABDOMEN - 1 VIEW COMPARISON:  07/31/2018 FINDINGS: There is elevation of LEFT hemidiaphragm. There is gaseous distension of the stomach. There is increased opacity at the LEFT lung base, consistent with atelectasis or consolidation and pleural effusion. No bowel obstruction. Remote posttraumatic diastases of the symphysis pubis. Bilateral hip replacements. Degenerative changes are seen in the spine. IMPRESSION: 1. Developing density at the LEFT lung base. 2. Gaseous distension of the stomach. 3. Nonobstructed bowel-gas pattern. Electronically Signed   By: Nolon Nations M.D.   On: 08/30/2018 13:36   Ct Angio Chest Pe W Or Wo Contrast  Result Date: 09/02/2018 CLINICAL DATA:  Shortness of breath.  Hypoxia. EXAM: CT ANGIOGRAPHY CHEST WITH CONTRAST TECHNIQUE: Multidetector CT imaging of  the chest was performed using the standard protocol during bolus administration of intravenous contrast. Multiplanar CT image reconstructions and MIPs were obtained to evaluate the vascular anatomy. CONTRAST:  123mL ISOVUE-370 IOPAMIDOL (ISOVUE-370) INJECTION 76% COMPARISON:  None. FINDINGS: Cardiovascular: Satisfactory opacification of the pulmonary arteries to the segmental level. Pulmonary emboli in the segmental branches of the left upper lobe. Normal heart size. No pericardial effusion. Ascending thoracic aorta measures 4.1 cm in transverse diameter. Mediastinum/Nodes: No enlarged mediastinal, hilar, or axillary lymph nodes. Thyroid gland, trachea, and esophagus demonstrate no significant findings. Lungs/Pleura: Trace left pleural effusion. Left basilar atelectasis. Mild right basilar atelectasis. No pneumothorax. Upper Abdomen: No acute upper abdominal abnormality. Musculoskeletal: No acute osseous abnormality. No aggressive osseous lesion. Review of the MIP images confirms the above findings. IMPRESSION: 1. Pulmonary emboli in the segmental branches of the left upper lobe. 2. Bibasilar atelectasis, left greater than right. Critical Value/emergent results were called by telephone at the time of interpretation on 09/02/2018 at 12:21 pm to Dr. Doreatha Lew , who verbally acknowledged these results. Electronically Signed   By: Kathreen Devoid   On: 09/02/2018 12:27   Dg Chest Port 1 View  Result Date: 08/29/2018 CLINICAL DATA:  77 year old with history of C difficile colitis, presenting with acute shortness of breath. EXAM: PORTABLE CHEST 1 VIEW COMPARISON:  08/26/2018, 08/25/2018 and earlier, including CTA chest 06/04/2018 and earlier. FINDINGS: RIGHT arm PICC tip projects at or near the cavoatrial junction. Interval RIGHT jugular central venous catheter removal. Cardiac silhouette upper normal in size to slightly enlarged, unchanged. Thoracic aorta mildly atherosclerotic, unchanged. Consolidation involving  the LEFT LOWER LOBE, unchanged since May,  2019, associated with mild elevation of the LEFT hemidiaphragm. No new pulmonary parenchymal abnormalities. Note is made of severe degenerative changes in both shoulder joints. IMPRESSION: 1. Chronic LEFT LOWER LOBE atelectasis, unchanged dating back to May, 2019, associated with mild elevation of the LEFT hemidiaphragm. 2. No new/acute cardiopulmonary disease. 3. RIGHT arm PICC tip at or near the expected location of the cavoatrial junction. Electronically Signed   By: Evangeline Dakin M.D.   On: 08/29/2018 11:19   Dg Chest Port 1 View  Result Date: 08/26/2018 CLINICAL DATA:  Status post central line placement EXAM: PORTABLE CHEST 1 VIEW COMPARISON:  08/25/2018 FINDINGS: Cardiac shadow is stable. Right jugular central venous line is noted extending to the cavoatrial junction. No pneumothorax is seen. Mild left basilar atelectasis and small effusion are seen. No bony abnormality is noted. IMPRESSION: No pneumothorax following right jugular central line placement. Stable changes in the left base. Electronically Signed   By: Inez Catalina M.D.   On: 08/26/2018 16:19   Dg Chest Port 1 View  Result Date: 08/25/2018 CLINICAL DATA:  Shortness of breath.  Hemorrhagic shock. EXAM: PORTABLE CHEST 1 VIEW COMPARISON:  Chest x-ray of August 19, 2018 FINDINGS: The lungs are reasonably well expanded. There is stable increased density at the left lung base with mild elevation of the left hemidiaphragm. Coarse bilateral perihilar densities are present and slightly more conspicuous. Lung the lung markings are coarse in the right infrahilar region and also more conspicuous. The heart is top-normal in size. The pulmonary vascularity is normal. There is calcification in the wall of the aortic arch. There is severe degenerative change of the left shoulder with resorption of the distal aspect of the clavicle. IMPRESSION: Slight increase conspicuity of the perihilar lung markings and right  infrahilar lung markings suggests subsegmental atelectasis. Stable density at the left lung base with elevation of the left hemidiaphragm. Thoracic aortic atherosclerosis. Electronically Signed   By: David  [place] M.D.   On: 08/25/2018 09:26   Dg Chest Portable 1 View  Result Date: 08/19/2018 CLINICAL DATA:  Hypotension.  Rectal bleeding. EXAM: PORTABLE CHEST 1 VIEW COMPARISON:  08/03/2018 and 05/19/2018 FINDINGS: Heart size and pulmonary vascularity are normal. The lungs are clear. Slight chronic elevation of the left hemidiaphragm with slight chronic atelectasis at the left lung base. No acute bone abnormality. Severe left glenohumeral joint arthritis. Findings consistent with chronic complete rotator cuff tears bilaterally. IMPRESSION: No acute abnormalities. Chronic elevation of the left hemidiaphragm with slight chronic atelectasis at the left lung base. Electronically Signed   By: Lorriane Shire M.D.   On: 08/19/2018 16:10   [place] Ekg Site Rite  Result Date: 08/28/2018 If Site Rite image not attached, placement could not be confirmed due to current cardiac rhythm.  [place] Ekg Site Rite  Result Date: 08/26/2018 If Site Rite image not attached, placement could not be confirmed due to current cardiac rhythm.   Labs:  CBC: Recent Labs    09/03/18 0450 09/03/18 1253 09/04/18 0416 09/05/18 0145  WBC 9.3 9.3 9.8 9.0  HGB 7.9* 7.9* 7.8* 6.2*  HCT 25.8* 25.3* 25.0* 20.0*  PLT 158 174 193 170    COAGS: Recent Labs    07/28/18 1713 08/19/18 1517 08/26/18 1643 09/02/18 1405  INR 1.30 1.18 1.21 1.03  APTT  --   --  30 36    BMP: Recent Labs    09/01/18 0458 09/03/18 0450 09/03/18 1253 09/05/18 0249  NA 140 139 142 142  K 4.1 4.1  3.8 4.0  CL 104 98 100 96*  CO2 31 33* 35* 36*  GLUCOSE 107* 116* 166* 186*  BUN 11 21 25* 33*  CALCIUM 8.1* 8.1* 8.1* 7.4*  CREATININE 0.71 0.97 1.05 1.28*  GFRNONAA >60 >60 >60 52*  GFRAA >60 >60 >60 >60    LIVER FUNCTION TESTS: Recent Labs     08/27/18 1145 08/28/18 0525 08/30/18 0430 09/01/18 0458  BILITOT 0.6 0.7 0.6 0.5  AST 10* 10* 9* 10*  ALT 7 7 8 7   ALKPHOS 69 75 73 84  PROT 5.1* 5.5* 5.3* 5.6*  ALBUMIN 2.0* 2.2* 2.0* 2.0*    TUMOR MARKERS: No results for input(s): AFPTM, CEA, CA199, CHROMGRNA in the last 8760 hours.  Assessment and Plan:  77 year old male with known duodenal ulcer and new onset of life-threatening upper GI hemorrhage after initiation of NOAC for PE within last 24 hours.  GI team has treated on 8/29, and currently feels further endo management may be futile.    I agree at this moment angio/embo is indicated.   Risks and benefits of angio/embo were discussed with the patient and family including, but not limited to bleeding, infection, vascular injury or contrast induced renal failure.  This interventional procedure involves the use of X-rays and because of the nature of the planned procedure, it is possible that we will have prolonged use of X-ray fluoroscopy.  Potential radiation risks to you include (but are not limited to) the following: - A slightly elevated risk for cancer  several years later in life. This risk is typically less than 0.5% percent. This risk is low in comparison to the normal incidence of human cancer, which is 33% for women and 50% for men according to the [place]. - Radiation induced injury can include skin redness, resembling a rash, tissue breakdown / ulcers and hair loss (which can be temporary or permanent).   The likelihood of either of these occurring depends on the difficulty of the procedure and whether you are sensitive to radiation due to previous procedures, disease, or genetic conditions.   IF your procedure requires a prolonged use of radiation, you will be notified and given written instructions for further action.  It is your responsibility to monitor the irradiated area for the 2 weeks following the procedure and to notify your physician  if you are concerned that you have suffered a radiation induced injury.    All of the patient's questions were answered, patient is agreeable to proceed.  Consent signed and in chart.  Plan:  - Agree with ongoing resuscitation - plan for angio/embolization, with GDA likely target - will need ICU care afterwards   Electronically Signed: Corrie Mckusick, DO 09/05/2018, 3:49 AM   I spent a total of 55 Mi[place]utes    in face to face in clinical consultation, greater than 50% of which was counseling/coordinating care for emergent upper gi bleeding, possible embolization

## 2018-09-05 NOTE — Progress Notes (Signed)
[name]  NWG:[id number] DOB: 17-Dec-1941 DOA: 08/19/2018 PCP: [name], DO    LOS: 17 days   Reason for Consult / Chief Complaint:  GI bleed  Consulting MD and date:  [name] MD- 8/28  HPI/Summary of hospital stay:  77 year old nursing home resident with recent admission for septic shock, UTI.pneumonia/soft tissue infection from 7/30-8/9. Hospital course significant for heart failure, PEA arrest for 2 mins on 8/2, Developed C. difficile colitis in nursing home after discharge.  8/21- Admitted to ICU on 8/21 with black tarry stool, shock. GI consulted but patient declined scope 8/23- Transferred from ICU to [facility] 8/27- Developed dyspnea, tachypnea, large black stool 8/28- Transfer back to ICU for ongoing bleed, hypotension. PCCM re consulted.  PPI infusion started GI consulted again 8/29: EGD- Large duodenal ulcers which were treated with epinephrine and hemo spray to achieve hemostasis.  9/4: Acute PE, started anticoagulation with heparin, transition to eliquis 9/6 9/7: Massive GIB, transfused, anticoagulation reversed. Embolized by IR. Transfer to ICU Status post 4 units PRBC, 2 units FFP, 10 mg vit k, adneexa, .  On neo, vaso, protonix drip  Subjective:  Patient examined on return from IR.  Remains somnolent but arousable, mild increased work of breathing with no complaints  Objective   Blood pressure (!) 112/43, pulse (!) 112, temperature 97.9 F (36.6 C), temperature source Oral, resp. rate 18, height 6' (1.829 m), weight (!) 139.9 kg, SpO2 100 %.        Intake/Output Summary (Last 24 hours) at 09/05/2018 0734 Last data filed at 09/04/2018 1844 Gross per 24 hour  Intake 240 ml  Output 300 ml  Net -60 ml   Filed Weights   09/03/18 0434 09/04/18 0700 09/05/18 0500  Weight: (!) 138.7 kg (!) 137.5 kg (!) 139.9 kg   Examination: Gen:      Chronically ill-appearing, moderate distress HEENT:  EOMI, sclera anicteric Neck:     No masses; no thyromegaly Lungs:     Clear to auscultation bilaterally; normal respiratory effort CV:         Regular rate and rhythm; no murmurs Abd:      + bowel sounds; soft, non-tender; no palpable masses, no distension Ext:    No edema; adequate peripheral perfusion Skin:      Warm and dry; no rash Neuro: Alert, arousable.  Consultants: date/date of s/o (if indicated) and final recs:    GI 8/22- EGD 8/30> s/p epi, hemospray.  Palliative care 8/27- Made limited code IR 9/7- Embolization of GDA  Procedures: Right internal jugular vein CVL 8/28 > 8/30 Rt PICC 8/30 >  Significant Diagnostic Tests: CTA 09/02/2018- left upper lobe segmental embolism, bibasilar atelectasis have reviewed the images personally.  Echo 08/02/2018-LVEF 30-35%, severe hypokinesis of mid-apicalanteroseptal, anterior, and apical myocardium.  Grade 1 diastolic dysfunction.  PA systolic pressure within normal range.  EGD completed on 8/29: Large nonbleeding diverticulum found in the gastric fundus, no evidence of active bleeding.  Diffuse mild inflammation in the gastric body and gastric antrum.  Biopsies were taken.  2 nonobstructing oozing cratered duodenal ulcers with visible vessel found at the duodenal bulb.  Biopsies were taken.  Hemostasis was achieved with epinephrine and subsequent hemo-spray.  8/29 stomach particular biopsy>>> chronic gastritis 8/29 duodenal ulcer biopsies x2>>> focal acute inflammation.  Micro Data: Blood cultures 8/21-negative MRSA PCR 8/21-negative Urine culture 8/21-multiple species C. difficile PCR positive on 8/21   antimicrobials:  PO. Vancomycin 8/21 >> 8/29  [facility]  Problems:     Assessment & Plan:  77 year old with recurrent GI bleed, duodenal ulcer  Acute GI bleed with acute blood loss anemia in the setting of duodenal ulcers with visible blood vessels identified via EGD on 8/29, and treated with hemo-spray Back to the ICU with massive GI bleed status post embolization by IR Plan: Continue  PPI drip. BP has stabilized post IR procedure.  We will start weaning down the pressors A line placement today Recheck CBC  Cardiomyopathy. EF 30-35%, status post recent cardiac arrest HTN Plan  Holding aspirin, Lopressor, Cozaar  Segmental pulmonary embolism Hold all anticoagulation. Will get lower extremity Dopplers when stable and consider IVC filter.   H/O C. difficile colitis Plan Finished oral vancomycin. C. difficile test shows positive antigen, negative toxin.  He is likely colonized at this point.  Diabetes Sugars high today Plan Change SSI to moderate scale.  History of prostate adenocarcinoma Plan Follow-up with [facility] as outpatient  LE venous ulcers Plan  Continue routine wound care, Unna boots   Profound debilitation and weakness/deconditioning Plan PT when stable  Goals of care Plan Palliative care on board.  He is limited code.  Continue discussions.  Disposition / Summary of Today's Plan 09/05/18   Will stabilize hemorrhagic shock, transfuse as needed Wean down pressors..  Best Practice / Goals of Care / Disposition.   DVT prophylaxis: SCDs, Unna boots GI prophylaxis: PPI drip Diet: NPO Mobility: Bedrest Code Status: Limited code, no CPR or intubation. Family Communication: No Family at bedside 9/7.  Labs   CBC: Recent Labs  Lab 08/30/18 0430 08/30/18 1409 08/31/18 0348  09/02/18 0337 09/03/18 0450 09/03/18 1253 09/04/18 0416 09/05/18 0145  WBC 11.1* 12.2* 10.4   < > 11.6* 9.3 9.3 9.8 9.0  NEUTROABS 8.2* 9.6* 7.3  --   --   --  6.7  --   --   HGB 8.8* 9.5* 8.6*   < > 8.0* 7.9* 7.9* 7.8* 6.2*  HCT 28.2* 30.3* 27.4*   < > 25.8* 25.8* 25.3* 25.0* 20.0*  MCV 94.6 93.8 93.8   < > 94.5 93.8 93.7 94.0 93.9  PLT 130* 140* 135*   < > 136* 158 174 193 170   < > = values in this interval not displayed.   Basic Metabolic Panel: Recent Labs  Lab 08/31/18 0348 09/01/18 0458 09/03/18 0450 09/03/18 1253 09/05/18 0249  NA 134*  140 139 142 142  K 3.7 4.1 4.1 3.8 4.0  CL 98 104 98 100 96*  CO2 32 31 33* 35* 36*  GLUCOSE 405* 107* 116* 166* 186*  BUN 13 11 21  25* 33*  CREATININE 0.85 0.71 0.97 1.05 1.28*  CALCIUM 7.6* 8.1* 8.1* 8.1* 7.4*  MG  --  1.7  --  1.5* 1.6*  PHOS  --   --   --   --  3.3   GFR: Estimated Creatinine Clearance: 69 mL/min (A) (by C-G formula based on SCr of 1.28 mg/dL (H)). Recent Labs  Lab 09/01/18 1128 09/02/18 0337 09/03/18 0450 09/03/18 1253 09/04/18 0416 09/05/18 0145  PROCALCITON <0.10 <0.10 <0.10  --   --   --   WBC  --  11.6* 9.3 9.3 9.8 9.0   Liver Function Tests: Recent Labs  Lab 08/30/18 0430 09/01/18 0458  AST 9* 10*  ALT 8 7  ALKPHOS 73 84  BILITOT 0.6 0.5  PROT 5.3* 5.6*  ALBUMIN 2.0* 2.0*   No results for input(s): LIPASE, AMYLASE in the last  168 hours. No results for input(s): AMMONIA in the last 168 hours. ABG    Component Value Date/Time   PHART 7.338 (L) 09/05/2018 0328   PCO2ART 66.0 (HH) 09/05/2018 0328   PO2ART 104 09/05/2018 0328   HCO3 34.4 (H) 09/05/2018 0328   TCO2 35 (H) 08/19/2018 1527   ACIDBASEDEF 0.3 07/31/2018 1121   O2SAT 98.1 09/05/2018 0328    Coagulation Profile: Recent Labs  Lab 09/02/18 1405 09/05/18 0249  INR 1.03 1.38   Cardiac Enzymes: Recent Labs  Lab 09/05/18 0249  TROPONINI <0.03   HbA1C: Hgb A1c MFr Bld  Date/Time Value Ref Range Status  05/19/2018 08:48 AM 9.3 (H) 4.8 - 5.6 % Final    Comment:    (NOTE) Pre diabetes:          5.7%-6.4% Diabetes:              >6.4% Glycemic control for   <7.0% adults with diabetes   03/15/2017 05:51 AM 14.3 (H) 4.8 - 5.6 % Final    Comment:    (NOTE)         Pre-diabetes: 5.7 - 6.4         Diabetes: >6.4         Glycemic control for adults with diabetes: <7.0    CBG: Recent Labs  Lab 09/04/18 0802 09/04/18 1155 09/04/18 1632 09/04/18 1951 09/04/18 2359  GLUCAP 114* 173* 140* 171* 100*   The patient is critically ill with multiple organ system failure and  requires high complexity decision making for assessment and support, frequent evaluation and titration of therapies, advanced monitoring, review of radiographic studies and interpretation of complex data.   Critical Care Time devoted to patient care services, exclusive of separately billable procedures, described in this note is 35 minutes.   Marshell Garfinkel MD [facility] 09/05/2018, 7:34 AM

## 2018-09-05 NOTE — Progress Notes (Signed)
CPAP QHS held due to Pt having NG tube in place as well as Hx of non-compliance.  CPAP machine remains at bedside if pt chooses to utilize.  RT to monitor and assess as needed.

## 2018-09-05 NOTE — Progress Notes (Signed)
Per Dr. Reche Dixon to hold Aline insertion at this time.  RT to monitor and assess as needed.

## 2018-09-05 NOTE — Progress Notes (Signed)
Referring Physician(s): Dr. Jimmey Ralph, Pulm/CCM  Supervising Physician: Corrie Mckusick  Patient Status:  [facility] - In-pt  Chief Complaint:  Acute recurrent life-threatening upper GI hemorrhage S/P GDA Embolization by Dr. Earleen Newport 9/7  Subjective:  Patient opens eyes to my voice and nods his head, but otherwise did not communicate.  Allergies: Ace inhibitors; Angiotensin receptor blockers; and Metformin  Medications: Prior to Admission medications   Medication Sig Start Date End Date Taking? Authorizing Provider  aspirin 81 MG chewable tablet Chew 81 mg by mouth every other day.    Yes [provider]  collagenase (SANTYL) ointment Apply 1 application topically daily.   Yes [provider]  ferrous sulfate 325 (65 FE) MG tablet Take 1 tablet (325 mg total) by mouth 2 (two) times daily with a meal. 08/07/18  Yes Anderson, Chelsey L, DO  FLOVENT HFA 220 MCG/ACT inhaler INHALE 1 PUFF INTO THE LUNGS TWICE DAILY Patient taking differently: Inhale 1 puff into the lungs 2 (two) times daily.  10/02/17  Yes Mikell, Jeani Sow, MD  fluticasone (FLONASE) 50 MCG/ACT nasal spray Place 2 sprays into both nostrils daily.   Yes [provider]  Infant Care Products (DERMACLOUD) CREA Apply 1 application topically 3 (three) times daily.   Yes [provider]  latanoprost (XALATAN) 0.005 % ophthalmic solution INSTILL 1 DROP IN BOTH EYES EVERY EVENING FOR GLAUCOMA Patient taking differently: Place 1 drop into both eyes at bedtime.  07/08/18  Yes [place] Bing, DO  losartan (COZAAR) 25 MG tablet Take 1 tablet (25 mg total) by mouth daily. 08/08/18  Yes Anderson, Chelsey L, DO  metoprolol tartrate (LOPRESSOR) 25 MG tablet Take 0.5 tablets (12.5 mg total) by mouth 2 (two) times daily. 08/07/18  Yes Anderson, Chelsey L, DO  nystatin (MYCOSTATIN/NYSTOP) powder Apply topically 2 (two) times daily. 07/13/18  Yes [place] Bing, DO  pravastatin (PRAVACHOL) 40 MG tablet TAKE 1  TABLET BY MOUTH DAILY 12/26/17  Yes Mikell, Jeani Sow, MD  Probiotic Product (PROBIOTIC PO) Take 2 capsules by mouth daily.   Yes [provider]  tamsulosin (FLOMAX) 0.4 MG CAPS capsule TAKE 2 CAPSULES BY MOUTH EVERY MORNING Patient taking differently: Take 0.8 mg by mouth daily.  07/08/18  Yes [place] Bing, DO  torsemide (DEMADEX) 20 MG tablet Take 2 tablets (40 mg total) by mouth daily. 08/08/18  Yes Anderson, Chelsey L, DO  urea 10 % lotion Apply topically 2 (two) times daily. 08/07/18  Yes Anderson, Chelsey L, DO  Vancomycin HCl (FIRVANQ) 50 MG/ML SOLR Take 2.5 mLs by mouth 4 (four) times daily.   Yes [provider]  famotidine (PEPCID) 20 MG tablet Take 2 tablets (40 mg total) by mouth daily. 08/23/18   Georgette Shell, MD  Hydrocortisone (GERHARDT'S BUTT CREAM) CREA Apply 1 application topically 3 (three) times daily. Patient not taking: Reported on 08/19/2018 08/07/18   Doristine Mango L, DO  mometasone (NASONEX) 50 MCG/ACT nasal spray INSTILL 2 SPRAYS INTO THE NOSE DAILY Patient not taking: Reported on 08/19/2018 07/08/18   [place] Bing, DO  PROAIR HFA 108 (90 Base) MCG/ACT inhaler INHALE 2 PUFFS BY MOUTH EVERY 4 HOURS AS NEEDED FOR WHEEZING OR SHORTNESS OF BREATH Patient taking differently: Inhale 2 puffs into the lungs every 4 (four) hours as needed for wheezing or shortness of breath.  04/18/18   Mikell, Jeani Sow, MD  traMADol (ULTRAM) 50 MG tablet Take 1 tablet (50 mg total) by mouth every 6 (six) hours as  needed for moderate pain or severe pain. 08/22/18   Georgette Shell, MD  vancomycin (VANCOCIN) 50 mg/mL oral solution Take 2.5 mLs (125 mg total) by mouth 4 (four) times daily. 08/22/18   Georgette Shell, MD     Vital Signs: BP (!) 108/38   Pulse 79   Temp 98.1 F (36.7 C) (Oral)   Resp 14   Ht 6' (1.829 m)   Wt (!) 139.9 kg   SpO2 100%   BMI 41.83 kg/m   Physical Exam Opens eyes, seems somnolent. NAD NGT in place, still bloody  drainage, about 100 mL in cannister. Abdomen soft, NT Right CFA stick site soft, no bleeding/hematoma, no pseudoanuerysm  Imaging: Dg Abd 1 View  Result Date: 09/05/2018 CLINICAL DATA:  Enteric tube placement. EXAM: ABDOMEN - 1 VIEW COMPARISON:  Abdominal x-ray dated August 30, 2018. FINDINGS: Enteric tube tip in the region of the proximal duodenum. Nonobstructive bowel gas pattern. Interval coil embolization of the gastroduodenal artery. No radiopaque calculi or other significant abnormality. No acute osseous abnormality. Unchanged left basilar airspace disease and small left pleural effusion. IMPRESSION: 1. Enteric tube tip in the region of the proximal duodenum. 2. Unchanged left basilar airspace disease and small left pleural effusion. Electronically Signed   By: Titus Dubin M.D.   On: 09/05/2018 09:25   Ct Angio Chest Pe W Or Wo Contrast  Result Date: 09/02/2018 CLINICAL DATA:  Shortness of breath.  Hypoxia. EXAM: CT ANGIOGRAPHY CHEST WITH CONTRAST TECHNIQUE: Multidetector CT imaging of the chest was performed using the standard protocol during bolus administration of intravenous contrast. Multiplanar CT image reconstructions and MIPs were obtained to evaluate the vascular anatomy. CONTRAST:  185mL ISOVUE-370 IOPAMIDOL (ISOVUE-370) INJECTION 76% COMPARISON:  None. FINDINGS: Cardiovascular: Satisfactory opacification of the pulmonary arteries to the segmental level. Pulmonary emboli in the segmental branches of the left upper lobe. Normal heart size. No pericardial effusion. Ascending thoracic aorta measures 4.1 cm in transverse diameter. Mediastinum/Nodes: No enlarged mediastinal, hilar, or axillary lymph nodes. Thyroid gland, trachea, and esophagus demonstrate no significant findings. Lungs/Pleura: Trace left pleural effusion. Left basilar atelectasis. Mild right basilar atelectasis. No pneumothorax. Upper Abdomen: No acute upper abdominal abnormality. Musculoskeletal: No acute osseous  abnormality. No aggressive osseous lesion. Review of the MIP images confirms the above findings. IMPRESSION: 1. Pulmonary emboli in the segmental branches of the left upper lobe. 2. Bibasilar atelectasis, left greater than right. Critical Value/emergent results were called by telephone at the time of interpretation on 09/02/2018 at 12:21 pm to Dr. Doreatha Lew , who verbally acknowledged these results. Electronically Signed   By: Kathreen Devoid   On: 09/02/2018 12:27   Ir Angiogram Visceral Selective  Result Date: 09/05/2018 INDICATION: 77 year old male with a history of acute, life-threatening recurrent upper GI bleeding. EXAM: ULTRASOUND GUIDED ACCESS RIGHT COMMON FEMORAL ARTERY MESENTERIC ANGIOGRAM COIL EMBOLIZATION GASTRODUODENAL ARTERY DEPLOYMENT OF EXOSEAL FOR HEMOSTASIS MEDICATIONS: None ANESTHESIA/SEDATION: Moderate (conscious) sedation was employed during this procedure. A total of Versed 1.0 mg and Fentanyl 50 mcg was administered intravenously. Moderate Sedation Time: 30 minutes. The patient's level of consciousness and vital signs were monitored continuously by radiology nursing throughout the procedure under my direct supervision. CONTRAST:  52mL OMNIPAQUE IOHEXOL 300 MG/ML SOLN, 31mL OMNIPAQUE IOHEXOL 300 MG/ML SOLN FLUOROSCOPY TIME:  Fluoroscopy Time: 8 minutes 6 seconds (1,192 mGy). COMPLICATIONS: None PROCEDURE: Informed consent was obtained from the patient following explanation of the procedure, risks, benefits and alternatives. The patient understands, agrees and consents for the  procedure. All questions were addressed. A time out was performed prior to the initiation of the procedure. Maximal barrier sterile technique utilized including caps, mask, sterile gowns, sterile gloves, large sterile drape, hand hygiene, and Betadine prep. Ultrasound survey of the right inguinal region was performed with images stored and sent to PACs, confirming patency of the vessel. A micropuncture needle was  used access the right common femoral artery under ultrasound. With excellent arterial blood flow returned, and an .018 micro wire was passed through the needle, observed enter the abdominal aorta under fluoroscopy. The needle was removed, and a micropuncture sheath was placed over the wire. The inner dilator and wire were removed, and an 035 Bentson wire was advanced under fluoroscopy into the abdominal aorta. The sheath was removed and a standard 5 [place] vascular sheath was placed. The dilator was removed and the sheath was flushed. Cobra catheter was advanced over the Bentson wire into the abdominal aorta. Catheter was used to select the celiac artery. Angiogram was performed. Catheter was withdrawn to the superior mesenteric artery. Angiogram was performed. Catheter was then used to select the celiac artery and a Glidewire was used to place the base catheter into the common hepatic artery, just at the origin of the gastroduodenal artery. Angiogram was performed. Microcatheter was then advanced into the gastroduodenal artery. Angiogram was performed. Empiric coil embolization was then performed within the gastroduodenal artery, extending to the origin of the GDA. Repeat angiogram was performed. Catheters and wires were removed, and an Exoseal was deployed at the right common femoral artery access for hemostasis. Patient tolerated the procedure well. Hemodynamics were unchanged during the course of the procedure, as the patient remained on pressor support. No complications encountered. No significant blood loss. IMPRESSION: Status post ultrasound guided access right common femoral artery for mesenteric angiogram and empiric coil embolization of the gastroduodenal artery for acute life-threatening upper GI hemorrhage. Signed, Dulcy Fanny. Dellia Nims, RPVI [facility] Radiology Electronically Signed   By: Corrie Mckusick D.O.   On: 09/05/2018 08:13   Ir Angiogram Visceral  Selective  Result Date: 09/05/2018 INDICATION: 77 year old male with a history of acute, life-threatening recurrent upper GI bleeding. EXAM: ULTRASOUND GUIDED ACCESS RIGHT COMMON FEMORAL ARTERY MESENTERIC ANGIOGRAM COIL EMBOLIZATION GASTRODUODENAL ARTERY DEPLOYMENT OF EXOSEAL FOR HEMOSTASIS MEDICATIONS: None ANESTHESIA/SEDATION: Moderate (conscious) sedation was employed during this procedure. A total of Versed 1.0 mg and Fentanyl 50 mcg was administered intravenously. Moderate Sedation Time: 30 minutes. The patient's level of consciousness and vital signs were monitored continuously by radiology nursing throughout the procedure under my direct supervision. CONTRAST:  51mL OMNIPAQUE IOHEXOL 300 MG/ML SOLN, 25mL OMNIPAQUE IOHEXOL 300 MG/ML SOLN FLUOROSCOPY TIME:  Fluoroscopy Time: 8 minutes 6 seconds (1,192 mGy). COMPLICATIONS: None PROCEDURE: Informed consent was obtained from the patient following explanation of the procedure, risks, benefits and alternatives. The patient understands, agrees and consents for the procedure. All questions were addressed. A time out was performed prior to the initiation of the procedure. Maximal barrier sterile technique utilized including caps, mask, sterile gowns, sterile gloves, large sterile drape, hand hygiene, and Betadine prep. Ultrasound survey of the right inguinal region was performed with images stored and sent to PACs, confirming patency of the vessel. A micropuncture needle was used access the right common femoral artery under ultrasound. With excellent arterial blood flow returned, and an .018 micro wire was passed through the needle, observed enter the abdominal aorta under fluoroscopy. The needle was removed, and a micropuncture sheath was  placed over the wire. The inner dilator and wire were removed, and an 035 Bentson wire was advanced under fluoroscopy into the abdominal aorta. The sheath was removed and a standard 5 [place] vascular sheath was placed. The dilator  was removed and the sheath was flushed. Cobra catheter was advanced over the Bentson wire into the abdominal aorta. Catheter was used to select the celiac artery. Angiogram was performed. Catheter was withdrawn to the superior mesenteric artery. Angiogram was performed. Catheter was then used to select the celiac artery and a Glidewire was used to place the base catheter into the common hepatic artery, just at the origin of the gastroduodenal artery. Angiogram was performed. Microcatheter was then advanced into the gastroduodenal artery. Angiogram was performed. Empiric coil embolization was then performed within the gastroduodenal artery, extending to the origin of the GDA. Repeat angiogram was performed. Catheters and wires were removed, and an Exoseal was deployed at the right common femoral artery access for hemostasis. Patient tolerated the procedure well. Hemodynamics were unchanged during the course of the procedure, as the patient remained on pressor support. No complications encountered. No significant blood loss. IMPRESSION: Status post ultrasound guided access right common femoral artery for mesenteric angiogram and empiric coil embolization of the gastroduodenal artery for acute life-threatening upper GI hemorrhage. Signed, Dulcy Fanny. Dellia Nims, RPVI [facility] Inter[facility] Radiology Special[facility] Radiology Electronically Signed   By: Corrie Mckusick D.O.   On: 09/05/2018 08:13   Ir Angiogram Selective Each Additional Vessel  Result Date: 09/05/2018 INDICATION: 77 year old male with a history of acute, life-threatening recurrent upper GI bleeding. EXAM: ULTRASOUND GUIDED ACCESS RIGHT COMMON FEMORAL ARTERY MESENTERIC ANGIOGRAM COIL EMBOLIZATION GASTRODUODENAL ARTERY DEPLOYMENT OF EXOSEAL FOR HEMOSTASIS MEDICATIONS: None ANESTHESIA/SEDATION: Moderate (conscious) sedation was employed during this procedure. A total of Versed 1.0 mg and Fentanyl 50 mcg was administered intravenously. Moderate  Sedation Time: 30 minutes. The patient's level of consciousness and vital signs were monitored continuously by radiology nursing throughout the procedure under my direct supervision. CONTRAST:  90mL OMNIPAQUE IOHEXOL 300 MG/ML SOLN, 37mL OMNIPAQUE IOHEXOL 300 MG/ML SOLN FLUOROSCOPY TIME:  Fluoroscopy Time: 8 minutes 6 seconds (1,192 mGy). COMPLICATIONS: None PROCEDURE: Informed consent was obtained from the patient following explanation of the procedure, risks, benefits and alternatives. The patient understands, agrees and consents for the procedure. All questions were addressed. A time out was performed prior to the initiation of the procedure. Maximal barrier sterile technique utilized including caps, mask, sterile gowns, sterile gloves, large sterile drape, hand hygiene, and Betadine prep. Ultrasound survey of the right inguinal region was performed with images stored and sent to PACs, confirming patency of the vessel. A micropuncture needle was used access the right common femoral artery under ultrasound. With excellent arterial blood flow returned, and an .018 micro wire was passed through the needle, observed enter the abdominal aorta under fluoroscopy. The needle was removed, and a micropuncture sheath was placed over the wire. The inner dilator and wire were removed, and an 035 Bentson wire was advanced under fluoroscopy into the abdominal aorta. The sheath was removed and a standard 5 [place] vascular sheath was placed. The dilator was removed and the sheath was flushed. Cobra catheter was advanced over the Bentson wire into the abdominal aorta. Catheter was used to select the celiac artery. Angiogram was performed. Catheter was withdrawn to the superior mesenteric artery. Angiogram was performed. Catheter was then used to select the celiac artery and a Glidewire was used to place the base catheter into the  common hepatic artery, just at the origin of the gastroduodenal artery. Angiogram was performed.  Microcatheter was then advanced into the gastroduodenal artery. Angiogram was performed. Empiric coil embolization was then performed within the gastroduodenal artery, extending to the origin of the GDA. Repeat angiogram was performed. Catheters and wires were removed, and an Exoseal was deployed at the right common femoral artery access for hemostasis. Patient tolerated the procedure well. Hemodynamics were unchanged during the course of the procedure, as the patient remained on pressor support. No complications encountered. No significant blood loss. IMPRESSION: Status post ultrasound guided access right common femoral artery for mesenteric angiogram and empiric coil embolization of the gastroduodenal artery for acute life-threatening upper GI hemorrhage. Signed, Dulcy Fanny. Dellia Nims, RPVI [facility] Interventional Radiology Specialists [facility] Radiology Electronically Signed   By: Corrie Mckusick D.O.   On: 09/05/2018 08:13   Ir Angiogram Selective Each Additional Vessel  Result Date: 09/05/2018 INDICATION: 77 year old male with a history of acute, life-threatening recurrent upper GI bleeding. EXAM: ULTRASOUND GUIDED ACCESS RIGHT COMMON FEMORAL ARTERY MESENTERIC ANGIOGRAM COIL EMBOLIZATION GASTRODUODENAL ARTERY DEPLOYMENT OF EXOSEAL FOR HEMOSTASIS MEDICATIONS: None ANESTHESIA/SEDATION: Moderate (conscious) sedation was employed during this procedure. A total of Versed 1.0 mg and Fentanyl 50 mcg was administered intravenously. Moderate Sedation Time: 30 minutes. The patient's level of consciousness and vital signs were monitored continuously by radiology nursing throughout the procedure under my direct supervision. CONTRAST:  50mL OMNIPAQUE IOHEXOL 300 MG/ML SOLN, 31mL OMNIPAQUE IOHEXOL 300 MG/ML SOLN FLUOROSCOPY TIME:  Fluoroscopy Time: 8 minutes 6 seconds (1,192 mGy). COMPLICATIONS: None PROCEDURE: Informed consent was obtained from the patient following explanation of the procedure, risks, benefits and  alternatives. The patient understands, agrees and consents for the procedure. All questions were addressed. A time out was performed prior to the initiation of the procedure. Maximal barrier sterile technique utilized including caps, mask, sterile gowns, sterile gloves, large sterile drape, hand hygiene, and Betadine prep. Ultrasound survey of the right inguinal region was performed with images stored and sent to PACs, confirming patency of the vessel. A micropuncture needle was used access the right common femoral artery under ultrasound. With excellent arterial blood flow returned, and an .018 micro wire was passed through the needle, observed enter the abdominal aorta under fluoroscopy. The needle was removed, and a micropuncture sheath was placed over the wire. The inner dilator and wire were removed, and an 035 Bentson wire was advanced under fluoroscopy into the abdominal aorta. The sheath was removed and a standard 5 [place] vascular sheath was placed. The dilator was removed and the sheath was flushed. Cobra catheter was advanced over the Bentson wire into the abdominal aorta. Catheter was used to select the celiac artery. Angiogram was performed. Catheter was withdrawn to the superior mesenteric artery. Angiogram was performed. Catheter was then used to select the celiac artery and a Glidewire was used to place the base catheter into the common hepatic artery, just at the origin of the gastroduodenal artery. Angiogram was performed. Microcatheter was then advanced into the gastroduodenal artery. Angiogram was performed. Empiric coil embolization was then performed within the gastroduodenal artery, extending to the origin of the GDA. Repeat angiogram was performed. Catheters and wires were removed, and an Exoseal was deployed at the right common femoral artery access for hemostasis. Patient tolerated the procedure well. Hemodynamics were unchanged during the course of the procedure, as the patient remained on  pressor support. No complications encountered. No significant blood loss. IMPRESSION: Status post ultrasound guided access right  common femoral artery for mesenteric angiogram and empiric coil embolization of the gastroduodenal artery for acute life-threatening upper GI hemorrhage. Signed, Dulcy Fanny. Dellia Nims, RPVI V[facility] and Interventional Radiology Specialists [facility] Radiology Electronically Signed   By: Corrie Mckusick D.O.   On: 09/05/2018 08:13   Ir Angiogram Selective Each Additional Vessel  Result Date: 09/05/2018 INDICATION: 77 year old male with a history of acute, life-threatening recurrent upper GI bleeding. EXAM: ULTRASOUND GUIDED ACCESS RIGHT COMMON FEMORAL ARTERY MESENTERIC ANGIOGRAM COIL EMBOLIZATION GASTRODUODENAL ARTERY DEPLOYMENT OF EXOSEAL FOR HEMOSTASIS MEDICATIONS: None ANESTHESIA/SEDATION: Moderate (conscious) sedation was employed during this procedure. A total of Versed 1.0 mg and Fentanyl 50 mcg was administered intravenously. Moderate Sedation Time: 30 minutes. The patient's level of consciousness and vital signs were monitored continuously by radiology nursing throughout the procedure under my direct supervision. CONTRAST:  60mL OMNIPAQUE IOHEXOL 300 MG/ML SOLN, 55mL OMNIPAQUE IOHEXOL 300 MG/ML SOLN FLUOROSCOPY TIME:  Fluoroscopy Time: 8 minutes 6 seconds (1,192 mGy). COMPLICATIONS: None PROCEDURE: Informed consent was obtained from the patient following explanation of the procedure, risks, benefits and alternatives. The patient understands, agrees and consents for the procedure. All questions were addressed. A time out was performed prior to the initiation of the procedure. Maximal barrier sterile technique utilized including caps, mask, sterile gowns, sterile gloves, large sterile drape, hand hygiene, and Betadine prep. Ultrasound survey of the right inguinal region was performed with images stored and sent to PACs, confirming patency of the vessel. A micropuncture needle was used  access the right common femoral artery under ultrasound. With excellent arterial blood flow returned, and an .018 micro wire was passed through the needle, observed enter the abdominal aorta under fluoroscopy. The needle was removed, and a micropuncture sheath was placed over the wire. The inner dilator and wire were removed, and an 035 Bentson wire was advanced under fluoroscopy into the abdominal aorta. The sheath was removed and a standard 5 [place] vascular sheath was placed. The dilator was removed and the sheath was flushed. Cobra catheter was advanced over the Bentson wire into the abdominal aorta. Catheter was used to select the celiac artery. Angiogram was performed. Catheter was withdrawn to the superior mesenteric artery. Angiogram was performed. Catheter was then used to select the celiac artery and a Glidewire was used to place the base catheter into the common hepatic artery, just at the origin of the gastroduodenal artery. Angiogram was performed. Microcatheter was then advanced into the gastroduodenal artery. Angiogram was performed. Empiric coil embolization was then performed within the gastroduodenal artery, extending to the origin of the GDA. Repeat angiogram was performed. Catheters and wires were removed, and an Exoseal was deployed at the right common femoral artery access for hemostasis. Patient tolerated the procedure well. Hemodynamics were unchanged during the course of the procedure, as the patient remained on pressor support. No complications encountered. No significant blood loss. IMPRESSION: Status post ultrasound guided access right common femoral artery for mesenteric angiogram and empiric coil embolization of the gastroduodenal artery for acute life-threatening upper GI hemorrhage. Signed, Dulcy Fanny. Dellia Nims, RPVI V[facility] and Inter[facility] Radiology Specialists [facility] Radiology Electronically Signed   By: Corrie Mckusick D.O.   On: 09/05/2018 08:13   Ir US Guide Vasc Access  Right  Result Date: 09/05/2018 INDICATION: 77 year old male with a history of acute, life-threatening recurrent upper GI bleeding. EXAM: ULTRASOUND GUIDED ACCESS RIGHT COMMON FEMORAL ARTERY MESENTERIC ANGIOGRAM COIL EMBOLIZATION GASTRODUODENAL ARTERY DEPLOYMENT OF EXOSEAL FOR HEMOSTASIS MEDICATIONS: None ANESTHESIA/SEDATION: Moderate (conscious) sedation was employed during this  procedure. A total of Versed 1.0 mg and Fentanyl 50 mcg was administered intravenously. Moderate Sedation Time: 30 minutes. The patient's level of consciousness and vital signs were monitored continuously by radiology nursing throughout the procedure under my direct supervision. CONTRAST:  19mL OMNIPAQUE IOHEXOL 300 MG/ML SOLN, 69mL OMNIPAQUE IOHEXOL 300 MG/ML SOLN FLUOROSCOPY TIME:  Fluoroscopy Time: 8 minutes 6 seconds (1,192 mGy). COMPLICATIONS: None PROCEDURE: Informed consent was obtained from the patient following explanation of the procedure, risks, benefits and alternatives. The patient understands, agrees and consents for the procedure. All questions were addressed. A time out was performed prior to the initiation of the procedure. Maximal barrier sterile technique utilized including caps, mask, sterile gowns, sterile gloves, large sterile drape, hand hygiene, and Betadine prep. Ultrasound survey of the right inguinal region was performed with images stored and sent to PACs, confirming patency of the vessel. A micropuncture needle was used access the right common femoral artery under ultrasound. With excellent arterial blood flow returned, and an .018 micro wire was passed through the needle, observed enter the abdominal aorta under fluoroscopy. The needle was removed, and a micropuncture sheath was placed over the wire. The inner dilator and wire were removed, and an 035 Bentson wire was advanced under fluoroscopy into the abdominal aorta. The sheath was removed and a standard 5 [place] vascular sheath was placed. The dilator was  removed and the sheath was flushed. Cobra catheter was advanced over the Bentson wire into the abdominal aorta. Catheter was used to select the celiac artery. Angiogram was performed. Catheter was withdrawn to the superior mesenteric artery. Angiogram was performed. Catheter was then used to select the celiac artery and a Glidewire was used to place the base catheter into the common hepatic artery, just at the origin of the gastroduodenal artery. Angiogram was performed. Microcatheter was then advanced into the gastroduodenal artery. Angiogram was performed. Empiric coil embolization was then performed within the gastroduodenal artery, extending to the origin of the GDA. Repeat angiogram was performed. Catheters and wires were removed, and an Exoseal was deployed at the right common femoral artery access for hemostasis. Patient tolerated the procedure well. Hemodynamics were unchanged during the course of the procedure, as the patient remained on pressor support. No complications encountered. No significant blood loss. IMPRESSION: Status post ultrasound guided access right common femoral artery for mesenteric angiogram and empiric coil embolization of the gastroduodenal artery for acute life-threatening upper GI hemorrhage. Signed, Dulcy Fanny. Dellia Nims, RPVI [facility] Radiology Electronically Signed   By: Corrie Mckusick D.O.   On: 09/05/2018 08:13   [place] [employer]  Result Date: 09/05/2018 INDICATION: 78 year old male with a history of acute, life-threatening recurrent upper GI bleeding. EXAM: ULTRASOUND GUIDED ACCESS RIGHT COMMON FEMORAL ARTERY MESENTERIC ANGIOGRAM COIL EMBOLIZATION GASTRODUODENAL ARTERY DEPLOYMENT OF EXOSEAL FOR HEMOSTASIS MEDICATIONS: None ANESTHESIA/SEDATION: Moderate (conscious) sedation was employed during this procedure. A total of Versed 1.0 mg and Fentanyl 50 mcg was administered intravenously.  Moderate Sedation Time: 30 minutes. The patient's level of consciousness and vital signs were monitored continuously by radiology nursing throughout the procedure under my direct supervision. CONTRAST:  90mL OMNIPAQUE IOHEXOL 300 MG/ML SOLN, 46mL OMNIPAQUE IOHEXOL 300 MG/ML SOLN FLUOROSCOPY TIME:  Fluoroscopy Time: 8 minutes 6 seconds (1,192 mGy). COMPLICATIONS: None PROCEDURE: Informed consent was obtained from the patient following explanation of the procedure, risks, benefits and alternatives. The patient understands, agrees and consents for the procedure. All questions  were addressed. A time out was performed prior to the initiation of the procedure. Maximal barrier sterile technique utilized including caps, mask, sterile gowns, sterile gloves, large sterile drape, hand hygiene, and Betadine prep. Ultrasound survey of the right inguinal region was performed with images stored and sent to PACs, confirming patency of the vessel. A micropuncture needle was used access the right common femoral artery under ultrasound. With excellent arterial blood flow returned, and an .018 micro wire was passed through the needle, observed enter the abdominal aorta under fluoroscopy. The needle was removed, and a micropuncture sheath was placed over the wire. The inner dilator and wire were removed, and an 035 Bentson wire was advanced under fluoroscopy into the abdominal aorta. The sheath was removed and a standard 5 [place] vascular sheath was placed. The dilator was removed and the sheath was flushed. Cobra catheter was advanced over the Bentson wire into the abdominal aorta. Catheter was used to select the celiac artery. Angiogram was performed. Catheter was withdrawn to the superior mesenteric artery. Angiogram was performed. Catheter was then used to select the celiac artery and a Glidewire was used to place the base catheter into the common hepatic artery, just at the origin of the gastroduodenal artery. Angiogram was  performed. Microcatheter was then advanced into the gastroduodenal artery. Angiogram was performed. Empiric coil embolization was then performed within the gastroduodenal artery, extending to the origin of the GDA. Repeat angiogram was performed. Catheters and wires were removed, and an Exoseal was deployed at the right common femoral artery access for hemostasis. Patient tolerated the procedure well. Hemodynamics were unchanged during the course of the procedure, as the patient remained on pressor support. No complications encountered. No significant blood loss. IMPRESSION: Status post ultrasound guided access right common femoral artery for mesenteric angiogram and empiric coil embolization of the gastroduodenal artery for acute life-threatening upper GI hemorrhage. Signed, Dulcy Fanny. Dellia Nims, RPVI [facility] Radiology Electronically Signed   By: Corrie Mckusick D.O.   On: 09/05/2018 08:13    Labs:  CBC: Recent Labs    09/03/18 1253 09/04/18 0416 09/05/18 0145 09/05/18 0904  WBC 9.3 9.8 9.0 20.1*  HGB 7.9* 7.8* 6.2* 9.4*  HCT 25.3* 25.0* 20.0* 28.5*  PLT 174 193 170 99*    COAGS: Recent Labs    08/26/18 1643 09/02/18 1405 09/05/18 0249 09/05/18 0904  INR 1.21 1.03 1.38 1.13  APTT 30 36 40*  --     BMP: Recent Labs    09/01/18 0458 09/03/18 0450 09/03/18 1253 09/05/18 0249  NA 140 139 142 142  K 4.1 4.1 3.8 4.0  CL 104 98 100 96*  CO2 31 33* 35* 36*  GLUCOSE 107* 116* 166* 186*  BUN 11 21 25* 33*  CALCIUM 8.1* 8.1* 8.1* 7.4*  CREATININE 0.71 0.97 1.05 1.28*  GFRNONAA >60 >60 >60 52*  GFRAA >60 >60 >60 >60    LIVER FUNCTION TESTS: Recent Labs    08/27/18 1145 08/28/18 0525 08/30/18 0430 09/01/18 0458  BILITOT 0.6 0.7 0.6 0.5  AST 10* 10* 9* 10*  ALT 7 7 8 7   ALKPHOS 69 75 73 84  PROT 5.1* 5.5* 5.3* 5.6*  ALBUMIN 2.0* 2.2* 2.0* 2.0*    Assessment and Plan:  Acute GI bleed with acute blood loss anemia in  the setting of duodenal ulcers with visible blood vessels identified via EGD on 8/29, and treated with hemo-spray.  Recurrent massive GI bleed = Status post GDA embolization  by Dr. Earleen Newport.  Patient may require placement of IVC filter when stable.  Electronically Signed: Murrell Redden, PA-C 09/05/2018, 10:48 AM    I spent a total of 15 Minutes at the the patient's bedside AND on the patient's hospital floor or unit, greater than 50% of which was counseling/coordinating care for f/u GI bleed.

## 2018-09-05 NOTE — Progress Notes (Signed)
The patient is a 77 year old male known to our service. He was found to have a large duodenal ulcer with visible vessel and underwent endoscopy about a week ago.he had injection probe cautery and Hemospray to control the bleeding from my review of records. He was restarted on Eliquis was today after it was felt that he was stable. He has started bleeding again. He has been vomiting blood. He is hypotensive. I was called by the intensivist a short while ago thinking that he may need a repeat emergent endoscopy. In reviewing his chart however the last 4 or 5 notes written by both Dr. Heather Roberts and Dr Therisa Doyne stated that should he bleed again they recommended consultation with interventional radiology for embolization of the gastroduodenal artery. I have seen the patient he has an NG tube in he is bleeding, he is hypotensive. I have spoken to the intensivist to his here with him now. He is going to be stabilized. He is receiving blood transfusions. I would recommend also consulting IR for arteriogram and embolization of the gastroduodenal artery.Discussed with Dr Jacqualyn Posey from IR.

## 2018-09-05 NOTE — Progress Notes (Signed)
Patient was noted vomiting bright red blood. BP was in the 50s and 30s. RRT was called and MD notified. Order given  to start pt on NS bolus and to tranfers to ICU.

## 2018-09-05 NOTE — Progress Notes (Signed)
Paged by bedside RN who reported that the pt had begun vomiting a large amount of frank red blood. SBP was in the 50's, pt was also tachycardic, and lightheaded. A 1L NS Bolus was started and the pt was immediately transferred to stepdown. Pt was alert and oriented, abdomen rounded and soft, and in NAD. A stat CBC and Type Screen were ordered. Also ordered 2 units of PRBCs to be transfused stat as well. Spoke with Dr. Lucile Shutters with PCCM who recommended ordering a CTA of the abdomen and pelvis, starting 2 large bore IV's and 2 more units of PRBC's. He also stated that the ground team would round on the pt as well.   Pt had been previously followed by GI and had a large duodenal ulcer treated with epinephrine, cauterization, and hemo spray. They recommended that IR be consulted for embolization of GDA. Spoke with Dr. Earleen Newport from IR who recommended that GI be reconsulted for the pt to have a repeat EGD since he has been on Heparin/Eliquis. PCCM has previously spoke with [facility] and they are conferencing with IR to come up with the best intervention  Arby Barrette AGPCNP-BC, AGNP-C [facility] Pager- [phone]

## 2018-09-05 NOTE — Progress Notes (Signed)
eLink Physician-Brief Progress Note Patient Name: [name] DOB: Nov 19, 1941 MRN: [id number]   Date of Service  09/05/2018  HPI/Events of Note  Pt started on Apixaban yesterday evening. Recent history of GI bleeding with EGD documentation of large duodenal bulb ulcer. Pt in hemorrhagic shock.  eICU Interventions  NGT with gastric lavage, two large bore iv's, 6 units PRBC requested from blood bank-3 units transfused initially, Andexxa ordered to reverse Apixaban, arterial line, PTT,  PT & INR, GI requested to evaluate PT for emergent re-scoping, IR consulted, serial H & H, Protonix infusion        [name] 09/05/2018, 4:41 AM

## 2018-09-05 NOTE — Progress Notes (Signed)
Patient currently in IR and is receiving 4th unit of RBC's.

## 2018-09-05 NOTE — Progress Notes (Signed)
Rapid response patient arrives on ICU floor approximately at 0130 for severe hypotentsion. Patient had complaints of nausea and vomiting. Dx GI bleed. Total hematemesis thus far is 1L of frank red blood. Patient has received a total of 2 units of FFP, 2 1L boluses of NS, and 3 units of RBC's (1 pack of RBC could not be accounted for in documentation, RN encouraged to included in note per [facility]). Patient currently has a NG tube placed. Patient currently receiving pressors for hypotension. Patient sent to IR for possible embolization.

## 2018-09-05 NOTE — Progress Notes (Signed)
PULMONARY / CRITICAL CARE MEDICINE   Name: [name] MRN: [id number] DOB: 14-Jun-1941    ADMISSION DATE:  08/19/2018  CHIEF COMPLAINT: Hemorrhagic shock  HISTORY OF PRESENT ILLNESS:   45yoM with hx of Osteomyelitis, DM, Diastolic CHF, Prostate cancer, admit 7/30 with lethargy, sepsis (thought due to UTI and Pneumonia), and hypoxia, found to have AKI. Improved with antibiotics and IVF's. VQ scan negative for PE. On 8/2 he had cardiac arrest (2 minutes of CPR required before ROSC attained). Post arrest he was intubated and transferred to the ICU. Post arrest he has developed acute systolic CHF (EF 67%). His course was complicated by Cdiff colitis. Patient later extubated and discharged on 8/9.   He was re-admitted 8/21with black tarry stools and found to have an acute GIB. He was transfused 3u pRBC and discharged on 8/24 after declining endoscopy at that time.   He was readmitted 8/28 with continued GIB. He underwent EGD on 8/29 revealing a large duodenal ulcer with a visible vessel, treated with hema-spray. Since then he has continued to be anemic but Hgb 8's then trending down to 7.8 over past few days. Although he was improving from his sepsis and GIB well, he continued to be hypoxic. CTA Chest performed on 9/4 showing acute PE within the LUL segmental branches. Patient was started on a Heparin infusion on 9/4 and transitioned to Eliquis on 9/6 PM.   Early this AM he starting having large volume hematemesis with BP dropping into 50/30's. Patient transferred emergently to the ICU and STAT PCCM consult placed. I arrived promptly at patient's bedside to find BP 50/40's with 1L NS bolus infusing, no vasopressors infusing yet. At this time pRBC has been ordered but not infusing yet. NG tube in place but not on suction. Turned NG tube to suction and quickly filled 1 LITER of bright red frank blood from the suction canister. Patient is lethargic at time of my exam, actively retching, denies abdominal  pain. During the one hour spent at bedside trying to stablize him, he became unresponsive twice, likely related to hypotension and cerebral hypoperfusion. Multiple RN's at bedside working hard trying to stabilize him.   PAST MEDICAL HISTORY :  He  has a past medical history of Chronic osteomyelitis of lower leg ([place]) (11/20/2010), COLON POLYP (06/17/5092), Complication of anesthesia, Diabetes mellitus, Diabetic peripheral neuropathy associated with type 2 diabetes mellitus ([place]) (02/07/2014), GERD (gastroesophageal reflux disease), Grade II diastolic dysfunction, Heart murmur, HIATAL HERNIA WITH REFLUX (09/29/2006), HIP REPLACEMENT, BILATERAL, HX OF (05/12/2007), History of blood transfusion, Hypertension, OSTEOARTHRITIS (03/05/2007), PONV (postoperative nausea and vomiting), Prostate cancer ([place]), SBO (small bowel obstruction) ([place]) (02/2017), Seasonal allergies, and VENOUS INSUFFICIENCY, CHRONIC (02/26/2007).  PAST SURGICAL HISTORY: He  has a past surgical history that includes Hip Arthroplasty; Leg wound repair / closure; Skin graft; Skin graft (1976); Wrist fusion; Colonoscopy; Upper gastrointestinal endoscopy; Leg Surgery; Wisdom tooth extraction; I&D extremity (08/14/2012); Colonoscopy; Irrigation and debridement abscess (Left, 03/18/2014); I&D extremity (Left, 03/18/2014); Esophagogastroduodenoscopy (egd) with propofol (N/A, 08/27/2018); submucosal injection (08/27/2018); and Hot hemostasis (N/A, 08/27/2018).  Allergies  Allergen Reactions  . Ace Inhibitors Cough    With Lisinopril.  . Angiotensin Receptor Blockers Other (See Comments)    Caused excessive weight gain  . Metformin Diarrhea    No current facility-administered medications on file prior to encounter.    Current Outpatient Medications on File Prior to Encounter  Medication Sig  . aspirin 81 MG chewable tablet Chew 81 mg by mouth every other day.   Marland Kitchen  collagenase (SANTYL) ointment Apply 1 application topically daily.  . ferrous sulfate 325  (65 FE) MG tablet Take 1 tablet (325 mg total) by mouth 2 (two) times daily with a meal.  . FLOVENT HFA 220 MCG/ACT inhaler INHALE 1 PUFF INTO THE LUNGS TWICE DAILY (Patient taking differently: Inhale 1 puff into the lungs 2 (two) times daily. )  . fluticasone (FLONASE) 50 MCG/ACT nasal spray Place 2 sprays into both nostrils daily.  . Infant Care Products (DERMACLOUD) CREA Apply 1 application topically 3 (three) times daily.  Marland Kitchen latanoprost (XALATAN) 0.005 % ophthalmic solution INSTILL 1 DROP IN BOTH EYES EVERY EVENING FOR GLAUCOMA (Patient taking differently: Place 1 drop into both eyes at bedtime. )  . losartan (COZAAR) 25 MG tablet Take 1 tablet (25 mg total) by mouth daily.  . metoprolol tartrate (LOPRESSOR) 25 MG tablet Take 0.5 tablets (12.5 mg total) by mouth 2 (two) times daily.  Marland Kitchen nystatin (MYCOSTATIN/NYSTOP) powder Apply topically 2 (two) times daily.  . pravastatin (PRAVACHOL) 40 MG tablet TAKE 1 TABLET BY MOUTH DAILY  . Probiotic Product (PROBIOTIC PO) Take 2 capsules by mouth daily.  . tamsulosin (FLOMAX) 0.4 MG CAPS capsule TAKE 2 CAPSULES BY MOUTH EVERY MORNING (Patient taking differently: Take 0.8 mg by mouth daily. )  . torsemide (DEMADEX) 20 MG tablet Take 2 tablets (40 mg total) by mouth daily.  . urea 10 % lotion Apply topically 2 (two) times daily.  . Vancomycin HCl (FIRVANQ) 50 MG/ML SOLR Take 2.5 mLs by mouth 4 (four) times daily.  . Hydrocortisone (GERHARDT'S BUTT CREAM) CREA Apply 1 application topically 3 (three) times daily. (Patient not taking: Reported on 08/19/2018)  . mometasone (NASONEX) 50 MCG/ACT nasal spray INSTILL 2 SPRAYS INTO THE NOSE DAILY (Patient not taking: Reported on 08/19/2018)  . PROAIR HFA 108 (90 Base) MCG/ACT inhaler INHALE 2 PUFFS BY MOUTH EVERY 4 HOURS AS NEEDED FOR WHEEZING OR SHORTNESS OF BREATH (Patient taking differently: Inhale 2 puffs into the lungs every 4 (four) hours as needed for wheezing or shortness of breath. )   FAMILY HISTORY:  His  family history includes Hypertension in his mother.  SOCIAL HISTORY: He  reports that he has never smoked. He has never used smokeless tobacco. He reports that he does not drink alcohol or use drugs.  REVIEW OF SYSTEMS:   Review of Systems  Unable to perform ROS: Critical illness   SUBJECTIVE:  Lying on ICU bed, actively hemorrhaging, Lethargic, In hemorrhagic shock, Critically ill  VITAL SIGNS: BP 102/61   Pulse (!) 113   Temp 97.9 F (36.6 C) (Oral)   Resp 15   Ht 6' (1.829 m)   Wt (!) 137.5 kg   SpO2 100%   BMI 41.11 kg/m   HEMODYNAMICS:  BP 50/30's not on vasopressors  INTAKE / OUTPUT: I/O last 3 completed shifts: In: 744.4 [P.O.:480; I.V.:264.4] Out: 1000 [Urine:1000]  PHYSICAL EXAMINATION: General: WDWN Elderly male, lethargic, critically ill Neuro: somnolent, when awake is obeying commands and answering questions but on 2 occasions became unresponsive with eyes open staring off in space, not obeying commands at that time. Improved both times after being given vasopressors and bicarb.  HEENT: NG tube in place on continuous suction with 900cc of bright red frank blood in suction cannister Cardiovascular: Tachycardic with a regular rhythm.   Lungs: CTA b/l Abdomen: Obese soft NTND Musculoskeletal: BLE wrapped with pressure dressings Skin: no LE edema   LABS:  BMET Recent Labs  Lab 09/01/18 0458 09/03/18 0450  09/03/18 1253  NA 140 139 142  K 4.1 4.1 3.8  CL 104 98 100  CO2 31 33* 35*  BUN 11 21 25*  CREATININE 0.71 0.97 1.05  GLUCOSE 107* 116* 166*    Electrolytes Recent Labs  Lab 09/01/18 0458 09/03/18 0450 09/03/18 1253  CALCIUM 8.1* 8.1* 8.1*  MG 1.7  --  1.5*   CBC Recent Labs  Lab 09/03/18 1253 09/04/18 0416 09/05/18 0145  WBC 9.3 9.8 9.0  HGB 7.9* 7.8* 6.2*  HCT 25.3* 25.0* 20.0*  PLT 174 193 170   Coag's Recent Labs  Lab 09/02/18 1405  APTT 36  INR 1.03   Sepsis Markers Recent Labs  Lab 09/01/18 1128 09/02/18 0337  09/03/18 0450  PROCALCITON <0.10 <0.10 <0.10   ABG No results for input(s): PHART, PCO2ART, PO2ART in the last 168 hours.  Liver Enzymes Recent Labs  Lab 08/30/18 0430 09/01/18 0458  AST 9* 10*  ALT 8 7  ALKPHOS 73 84  BILITOT 0.6 0.5  ALBUMIN 2.0* 2.0*   Cardiac Enzymes No results for input(s): TROPONINI, PROBNP in the last 168 hours.  Glucose Recent Labs  Lab 09/04/18 0411 09/04/18 0802 09/04/18 1155 09/04/18 1632 09/04/18 1951 09/04/18 2359  GLUCAP 113* 114* 173* 140* 171* 100*   Imaging No results found.  CULTURES: None   ANTIBIOTICS: None   SIGNIFICANT EVENTS: 9/7 AM: transferred emergently to ICU with large volume hematemesis and hemorrhagic shock  LINES/TUBES: RUE Double lumen PICC 8/30>> Extrenal urinary catheter 9/2>> NG tube 9/7 >> 20G PIV L-hand  DISCUSSION: 46yoM with hx of Osteomyelitis, DM, Diastolic CHF, Prostate cancer, admit 7/30 with lethargy, sepsis (thought due to UTI and Pneumonia), and hypoxia, found to have AKI. Improved with antibiotics and IVF's. VQ scan negative for PE. On 8/2 he had cardiac arrest (2 minutes of CPR required before ROSC attained). Post arrest he was intubated and transferred to the ICU. Post arrest he has developed acute systolic CHF (EF 10%). His course was complicated by Cdiff colitis. Patient later extubated and discharged on 8/9.   He was re-admitted 8/21with black tarry stools and found to have an acute GIB. He was transfused 3u pRBC and discharged on 8/24 after declining endoscopy at that time.   He was readmitted 8/28 with continued GIB. He underwent EGD on 8/29 revealing a large duodenal ulcer with a visible vessel, treated with hema-spray. Since then he has continued to be anemic but Hgb 8's then trending down to 7.8 over past few days. Although he was improving from his sepsis and GIB well, he continued to be hypoxic. CTA Chest performed on 9/4 showing acute PE within the LUL segmental branches. Patient was  started on a Heparin infusion on 9/4 and transitioned to Eliquis on 9/6 PM.   Early this AM he starting having large volume hematemesis with BP dropping into 50/30's. Patient transferred emergently to the ICU and STAT PCCM consult placed. I arrived promptly at patient's bedside to find BP 50/40'  ASSESSMENT / PLAN:  PULMONARY 1. Acute Hypoxic Respiratory failure; Acute LUL PE - Pox 100% on 3L O2 - ABG reviewed showing 7.33/66/104/34/98% consistent with mostly well compensated chronic hypercapnea.  - had been on anticoag for his acute PE but obviously is unable to tolerate it; once he is more stable can evaluate for an IVC filter.   CARDIOVASCULAR 1. CHF (Systolic and Diastolic); Hx Recent Cardiac arrest (8/2) - concern to avoid volume overload in setting of EF 30% and receiving multiple transfusions;  may need lasix following transfusions. Monitor closely.   RENAL 1. AKI; Hypomagnesemia: - creatinine 1.28, up from 0.9-1.0 previously, likely pre-renal in setting of acute hemorrhagic shock - continue monitoring UOP; avoid nephrotoxic agents.  - Magnesium 1.6; replete with 2g Magnesium sulfate IV  GASTROINTESTINAL 1. UGIB due to Duodenal ulcer; Hemorrhagic shock: - had received 1 dose Eliquis on 9/6; reversed it with Andexxa - received 3u pRBC thus far and 1u FFP as well as 10mg  Vit K. Hgb 6.8 at time of active bleed but likely is much lower as had not yet had time to equilibrate. Has had 1 LITER frank bright red blood from OG tube.  - repeat Hgb following transfusion then q4hrs; transfuse for Hgb >8.0 given ongoing bleeding - INR pending.  - start PPI infusion.  - received 2L IVF bolus and is now on Neo @ 400 and Vasopressin 0.08; titrating for MAP > 65 - GI consulted and said they have nothing left to offer him and recommended IR for embolization. IR consulted and plans to take him down for embolization shortly.  - Will need A-line at some point. At time of my exam BP was too low to  place radial Aline and didn't feel comfortable placing patient flat for femoral Aline with him actively retching, concerning he may aspirate.   HEMATOLOGIC 1. Acute PE; hx Prostate cancer  INFECTIOUS No active issues   ENDOCRINE 1. DM: - NPO; SSI q4hrs  NEUROLOGIC 1. Acute encephalopathy: - thought due to cerebral hypoperfusion in setting of severe shock; resolved when BP improved; continue to monitor closely.    FAMILY  - Updates: patient's daughter and son-in-law arrived and are bedside now. Updated them on his current critical condition and poor prognosis - Inter-disciplinary family meet or Palliative Care meeting due by: 09/11/18  120 minutes critical care time (02:15-04:15)  Vernie Murders, MD  Pulmonary and [place] Pager: [phone]  09/05/2018, 3:05 AM

## 2018-09-06 ENCOUNTER — Inpatient Hospital Stay (HOSPITAL_COMMUNITY): Payer: PPO

## 2018-09-06 DIAGNOSIS — K264 Chronic or unspecified duodenal ulcer with hemorrhage: Principal | ICD-10-CM

## 2018-09-06 LAB — GLUCOSE, CAPILLARY
GLUCOSE-CAPILLARY: 117 mg/dL — AB (ref 70–99)
GLUCOSE-CAPILLARY: 147 mg/dL — AB (ref 70–99)
GLUCOSE-CAPILLARY: 148 mg/dL — AB (ref 70–99)
GLUCOSE-CAPILLARY: 138 mg/dL — AB (ref 70–99)
GLUCOSE-CAPILLARY: 127 mg/dL — AB (ref 70–99)
GLUCOSE-CAPILLARY: 129 mg/dL — AB (ref 70–99)

## 2018-09-06 LAB — CBC
HCT: 24.9 % — ABNORMAL LOW (ref 39.0–52.0)
HCT: 21.5 % — ABNORMAL LOW (ref 39.0–52.0)
HEMATOCRIT: 24.1 % — AB (ref 39.0–52.0)
HEMATOCRIT: 28.4 % — AB (ref 39.0–52.0)
HEMOGLOBIN: 8.1 g/dL — AB (ref 13.0–17.0)
HEMOGLOBIN: 9.2 g/dL — AB (ref 13.0–17.0)
Hemoglobin: 7.9 g/dL — ABNORMAL LOW (ref 13.0–17.0)
Hemoglobin: 7 g/dL — ABNORMAL LOW (ref 13.0–17.0)
MCH: 29.5 pg (ref 26.0–34.0)
MCH: 29.6 pg (ref 26.0–34.0)
MCH: 29.3 pg (ref 26.0–34.0)
MCH: 29.4 pg (ref 26.0–34.0)
MCHC: 32.5 g/dL (ref 30.0–36.0)
MCHC: 32.8 g/dL (ref 30.0–36.0)
MCHC: 32.6 g/dL (ref 30.0–36.0)
MCHC: 32.4 g/dL (ref 30.0–36.0)
MCV: 90.5 fL (ref 78.0–100.0)
MCV: 90.3 fL (ref 78.0–100.0)
MCV: 90 fL (ref 78.0–100.0)
MCV: 90.7 fL (ref 78.0–100.0)
PLATELETS: 127 10*3/uL — AB (ref 150–400)
PLATELETS: 95 10*3/uL — AB (ref 150–400)
Platelets: 105 10*3/uL — ABNORMAL LOW (ref 150–400)
Platelets: 108 10*3/uL — ABNORMAL LOW (ref 150–400)
RBC: 2.75 MIL/uL — AB (ref 4.22–5.81)
RBC: 2.67 MIL/uL — AB (ref 4.22–5.81)
RBC: 2.39 MIL/uL — AB (ref 4.22–5.81)
RBC: 3.13 MIL/uL — AB (ref 4.22–5.81)
RDW: 17 % — ABNORMAL HIGH (ref 11.5–15.5)
RDW: 16.6 % — ABNORMAL HIGH (ref 11.5–15.5)
RDW: 16.5 % — ABNORMAL HIGH (ref 11.5–15.5)
RDW: 15.9 % — ABNORMAL HIGH (ref 11.5–15.5)
WBC: 13.8 10*3/uL — ABNORMAL HIGH (ref 4.0–10.5)
WBC: 13.6 10*3/uL — AB (ref 4.0–10.5)
WBC: 11.6 10*3/uL — AB (ref 4.0–10.5)
WBC: 14 10*3/uL — AB (ref 4.0–10.5)

## 2018-09-06 LAB — BASIC METABOLIC PANEL
ANION GAP: 9 (ref 5–15)
BUN: 32 mg/dL — ABNORMAL HIGH (ref 8–23)
CO2: 34 mmol/L — ABNORMAL HIGH (ref 22–32)
Calcium: 7.8 mg/dL — ABNORMAL LOW (ref 8.9–10.3)
Chloride: 105 mmol/L (ref 98–111)
Creatinine, Ser: 0.87 mg/dL (ref 0.61–1.24)
Glucose, Bld: 169 mg/dL — ABNORMAL HIGH (ref 70–99)
POTASSIUM: 3.9 mmol/L (ref 3.5–5.1)
Sodium: 148 mmol/L — ABNORMAL HIGH (ref 135–145)

## 2018-09-06 LAB — BPAM FFP
BLOOD PRODUCT EXPIRATION DATE: 201909122359
Blood Product Expiration Date: 201909122359
ISSUE DATE / TIME: 201909070327
ISSUE DATE / TIME: 201909070421
UNIT TYPE AND RH: 5100
UNIT TYPE AND RH: 5100

## 2018-09-06 LAB — PREPARE FRESH FROZEN PLASMA
UNIT DIVISION: 0
Unit division: 0

## 2018-09-06 LAB — PREPARE RBC (CROSSMATCH)

## 2018-09-06 LAB — PHOSPHORUS: PHOSPHORUS: 3.2 mg/dL (ref 2.5–4.6)

## 2018-09-06 LAB — MAGNESIUM: Magnesium: 1.7 mg/dL (ref 1.7–2.4)

## 2018-09-06 MED ORDER — SODIUM CHLORIDE 0.9% IV SOLUTION
Freq: Once | INTRAVENOUS | Status: AC
  Administered 2018-09-06: 11:00:00 via INTRAVENOUS

## 2018-09-06 MED ORDER — ONDANSETRON HCL 4 MG/2ML IJ SOLN
4.0000 mg | Freq: Four times a day (QID) | INTRAMUSCULAR | Status: DC | PRN
  Administered 2018-09-06: 4 mg via INTRAVENOUS
  Filled 2018-09-06 (×2): qty 2

## 2018-09-06 NOTE — Progress Notes (Signed)
[name]  SWH:[id number] DOB: Jul 30, 1941 DOA: 08/19/2018 PCP:  Bing, DO    LOS: 18 days   Reason for Consult / Chief Complaint:  GI bleed  Consulting MD and date:  [name] MD- 8/28  HPI/Summary of hospital stay:  77 year old nursing home resident with recent admission for septic shock, UTI.pneumonia/soft tissue infection from 7/30-8/9. Hospital course significant for heart failure, PEA arrest for 2 mins on 8/2, Developed C. difficile colitis in nursing home after discharge.  8/21- Admitted to ICU on 8/21 with black tarry stool, shock. GI consulted but patient declined scope 8/23- Transferred from ICU to [facility] 8/27- Developed dyspnea, tachypnea, large black stool 8/28- Transfer back to ICU for ongoing bleed, hypotension. PCCM re consulted.  PPI infusion started GI consulted again 8/29: EGD- Large duodenal ulcers which were treated with epinephrine and hemo spray to achieve hemostasis.  9/4: Acute PE, started anticoagulation with heparin, transition to eliquis 9/6 9/7: Massive GIB, transfused, anticoagulation reversed. Embolized by IR. Transfer to ICU Status post 4 units PRBC, 2 units FFP, 10 mg vit k, adneexa, .  On neo, vaso, protonix drip 9/8: BP better, Pressors weaning dowm  Subjective:  More awake today.  Does not remember events of the past 48 hours Denies any dyspnea, abdominal pain.  Objective   Blood pressure 133/62, pulse 68, temperature 98.1 F (36.7 C), temperature source Oral, resp. rate 16, height 6' (1.829 m), weight (!) 141.7 kg, SpO2 100 %.        Intake/Output Summary (Last 24 hours) at 09/06/2018 0819 Last data filed at 09/06/2018 0700 Gross per 24 hour  Intake 5703.39 ml  Output 626 ml  Net 5077.39 ml   Filed Weights   09/04/18 0700 09/05/18 0500 09/06/18 0440  Weight: (!) 137.5 kg (!) 139.9 kg (!) 141.7 kg   Examination: Blood pressure 133/62, pulse 68, temperature 98.1 F (36.7 C), temperature source Oral, resp. rate 16, height 6'  (1.829 m), weight (!) 141.7 kg, SpO2 100 %. Gen:      No acute distress, chronically ill-appearing HEENT:  EOMI, sclera anicteric Neck:     No masses; no thyromegaly Lungs:    Clear to auscultation bilaterally; normal respiratory effort CV:         Regular rate and rhythm; no murmurs Abd:      + bowel sounds; soft, non-tender; no palpable masses, no distension Ext:    Lower extremity in bandages, Unna boots. Skin:      Warm and dry; no rash Neuro: alert and oriented x 3   Consultants: date/date of s/o (if indicated) and final recs:   GI 8/22- EGD 8/30> s/p epi, hemospray.  Palliative care 8/27- Made limited code IR 9/7- Embolization of GDA  Procedures: Right internal jugular vein CVL 8/28 > 8/30 Rt PICC 8/30 >  Significant Diagnostic Tests: CTA 09/02/2018- left upper lobe segmental embolism, bibasilar atelectasis have reviewed the images personally.  Echo 08/02/2018-LVEF 30-35%, severe hypokinesis of mid-apicalanteroseptal, anterior, and apical myocardium.  Grade 1 diastolic dysfunction.  PA systolic pressure within normal range.  EGD completed on 8/29: Large nonbleeding diverticulum found in the gastric fundus, no evidence of active bleeding.  Diffuse mild inflammation in the gastric body and gastric antrum.  Biopsies were taken.  2 nonobstructing oozing cratered duodenal ulcers with visible vessel found at the duodenal bulb.  Biopsies were taken.  Hemostasis was achieved with epinephrine and subsequent hemo-spray.  8/29 stomach particular biopsy>>> chronic gastritis 8/29 duodenal ulcer biopsies  x2>>> focal acute inflammation.  Micro Data: Blood cultures 8/21-negative MRSA PCR 8/21-negative Urine culture 8/21-multiple species C. difficile PCR positive on 8/21   antimicrobials:  PO. Vancomycin 8/21 >> 8/29  Resolved Hospital Problems:     Assessment & Plan:  77 year old with recurrent GI bleed, duodenal ulcer  Acute GI bleed with acute blood loss anemia in the setting of  duodenal ulcers with visible blood vessels identified via EGD on 8/29, and treated with hemo-spray Back to the ICU with massive GI bleed status post embolization by IR Continues to have dark bloody output from NG tube and bowel movements with old clots. Hb lower today. Suspect that bleed has slowed down or stopped and he is equilibrating Hb and passing old blood Plan: Continue PPI drip Wean down pressors as tolerated. Defer A-line as his pressor requirements are improving Follow CBC and transfuse as needed  Cardiomyopathy. EF 30-35%, status post recent cardiac arrest HTN Plan  Holding aspirin, Lopressor, Cozaar  Segmental pulmonary embolism Hold all anticoagulation. Check LE dopplers. May need IVC filter  H/O C. difficile colitis Plan Finished oral vancomycin. C. difficile test shows positive antigen, negative toxin.  He is likely colonized at this point.  Diabetes Sugars high today Plan Continue SSI  History of prostate adenocarcinoma Plan Follow-up with [facility] as outpatient  LE venous ulcers Plan  Continue routine wound care, Unna boots   Profound debilitation and weakness/deconditioning Plan PT when stable  Goals of care Plan Palliative care on board.  He is limited code.  Continue discussions.  Disposition / Summary of Today's Plan 09/06/18   Continue to wean down pressors, transfuse as needed. Check lower extremity Dopplers.  Best Practice / Goals of Care / Disposition.   DVT prophylaxis: SCDs, Unna boots GI prophylaxis: PPI drip Diet: NPO Mobility: Bedrest Code Status: Limited code, no CPR or intubation. Family Communication: No Family at bedside 9/8  Labs   CBC: Recent Labs  Lab 08/30/18 1409 08/31/18 0348  09/03/18 1253  09/05/18 0904 09/05/18 1406 09/05/18 1815 09/05/18 2312 09/06/18 0333  WBC 12.2* 10.4   < > 9.3   < > 20.1* 17.1* 14.9* 13.8* 13.6*  NEUTROABS 9.6* 7.3  --  6.7  --   --   --   --   --   --   HGB 9.5* 8.6*   < >  7.9*   < > 9.4* 8.8* 8.5* 8.1* 7.9*  HCT 30.3* 27.4*   < > 25.3*   < > 28.5* 27.0* 25.5* 24.9* 24.1*  MCV 93.8 93.8   < > 93.7   < > 88.5 89.4 88.5 90.5 90.3  PLT 140* 135*   < > 174   < > 99* 99* 102* 105* 127*   < > = values in this interval not displayed.   Basic Metabolic Panel: Recent Labs  Lab 09/01/18 0458 09/03/18 0450 09/03/18 1253 09/05/18 0249 09/06/18 0333  NA 140 139 142 142 148*  K 4.1 4.1 3.8 4.0 3.9  CL 104 98 100 96* 105  CO2 31 33* 35* 36* 34*  GLUCOSE 107* 116* 166* 186* 169*  BUN 11 21 25* 33* 32*  CREATININE 0.71 0.97 1.05 1.28* 0.87  CALCIUM 8.1* 8.1* 8.1* 7.4* 7.8*  MG 1.7  --  1.5* 1.6* 1.7  PHOS  --   --   --  3.3 3.2   GFR: Estimated Creatinine Clearance: 102.1 mL/min (by C-G formula based on SCr of 0.87 mg/dL). Recent Labs  Lab 09/01/18  1128 09/02/18 0337 09/03/18 0450  09/05/18 1406 09/05/18 1815 09/05/18 2312 09/06/18 0333  PROCALCITON <0.10 <0.10 <0.10  --   --   --   --   --   WBC  --  11.6* 9.3   < > 17.1* 14.9* 13.8* 13.6*   < > = values in this interval not displayed.   Liver Function Tests: Recent Labs  Lab 09/01/18 0458  AST 10*  ALT 7  ALKPHOS 84  BILITOT 0.5  PROT 5.6*  ALBUMIN 2.0*   No results for input(s): LIPASE, AMYLASE in the last 168 hours. No results for input(s): AMMONIA in the last 168 hours. ABG    Component Value Date/Time   PHART 7.338 (L) 09/05/2018 0328   PCO2ART 66.0 (HH) 09/05/2018 0328   PO2ART 104 09/05/2018 0328   HCO3 34.4 (H) 09/05/2018 0328   TCO2 35 (H) 08/19/2018 1527   ACIDBASEDEF 0.3 07/31/2018 1121   O2SAT 98.1 09/05/2018 0328    Coagulation Profile: Recent Labs  Lab 09/02/18 1405 09/05/18 0249 09/05/18 0904  INR 1.03 1.38 1.13   Cardiac Enzymes: Recent Labs  Lab 09/05/18 0249  TROPONINI <0.03   HbA1C: Hgb A1c MFr Bld  Date/Time Value Ref Range Status  05/19/2018 08:48 AM 9.3 (H) 4.8 - 5.6 % Final    Comment:    (NOTE) Pre diabetes:          5.7%-6.4% Diabetes:               >6.4% Glycemic control for   <7.0% adults with diabetes   03/15/2017 05:51 AM 14.3 (H) 4.8 - 5.6 % Final    Comment:    (NOTE)         Pre-diabetes: 5.7 - 6.4         Diabetes: >6.4         Glycemic control for adults with diabetes: <7.0    CBG: Recent Labs  Lab 09/05/18 1637 09/05/18 1938 09/05/18 2335 09/06/18 0335 09/06/18 0743  GLUCAP 88 117* 126* 147* 148*   The patient is critically ill with multiple organ system failure and requires high complexity decision making for assessment and support, frequent evaluation and titration of therapies, advanced monitoring, review of radiographic studies and interpretation of complex data.   Critical Care Time devoted to patient care services, exclusive of separately billable procedures, described in this note is 35 minutes.   Marshell Garfinkel MD [facility] 09/06/2018, 8:26 AM

## 2018-09-06 NOTE — Progress Notes (Signed)
eLink Physician-Brief Progress Note Patient Name: [name] DOB: 25-Dec-1941 MRN: [id number]   Date of Service  09/06/2018  HPI/Events of Note  Nausea - QTc interval = 320 milliseconds.   eICU Interventions  Will order: 1. Zofran 4 mg IV Q 6 hours PRN N/V.     Intervention Category Major Interventions: Other:  Markie Heffernan Cornelia Copa 09/06/2018, 5:22 AM

## 2018-09-06 NOTE — Progress Notes (Signed)
Dr. Vaughan Browner notified of patients hemoglobin of 7.  Orders received.  Pt aware

## 2018-09-07 ENCOUNTER — Inpatient Hospital Stay (HOSPITAL_COMMUNITY): Payer: PPO

## 2018-09-07 ENCOUNTER — Encounter (HOSPITAL_COMMUNITY): Payer: Self-pay | Admitting: Anesthesiology

## 2018-09-07 DIAGNOSIS — R609 Edema, unspecified: Secondary | ICD-10-CM

## 2018-09-07 LAB — CBC
HCT: 27.4 % — ABNORMAL LOW (ref 39.0–52.0)
HEMATOCRIT: 24.7 % — AB (ref 39.0–52.0)
HEMATOCRIT: 24.5 % — AB (ref 39.0–52.0)
HEMOGLOBIN: 9 g/dL — AB (ref 13.0–17.0)
HEMOGLOBIN: 7.8 g/dL — AB (ref 13.0–17.0)
Hemoglobin: 7.8 g/dL — ABNORMAL LOW (ref 13.0–17.0)
MCH: 29.9 pg (ref 26.0–34.0)
MCH: 29.4 pg (ref 26.0–34.0)
MCH: 29.3 pg (ref 26.0–34.0)
MCHC: 32.8 g/dL (ref 30.0–36.0)
MCHC: 31.6 g/dL (ref 30.0–36.0)
MCHC: 31.8 g/dL (ref 30.0–36.0)
MCV: 91 fL (ref 78.0–100.0)
MCV: 93.2 fL (ref 78.0–100.0)
MCV: 92.1 fL (ref 78.0–100.0)
Platelets: 105 10*3/uL — ABNORMAL LOW (ref 150–400)
Platelets: 98 10*3/uL — ABNORMAL LOW (ref 150–400)
Platelets: 93 10*3/uL — ABNORMAL LOW (ref 150–400)
RBC: 3.01 MIL/uL — AB (ref 4.22–5.81)
RBC: 2.65 MIL/uL — AB (ref 4.22–5.81)
RBC: 2.66 MIL/uL — AB (ref 4.22–5.81)
RDW: 15.9 % — ABNORMAL HIGH (ref 11.5–15.5)
RDW: 16 % — AB (ref 11.5–15.5)
RDW: 15.9 % — ABNORMAL HIGH (ref 11.5–15.5)
WBC: 12.7 10*3/uL — AB (ref 4.0–10.5)
WBC: 10.2 10*3/uL (ref 4.0–10.5)
WBC: 10.7 10*3/uL — ABNORMAL HIGH (ref 4.0–10.5)

## 2018-09-07 LAB — BASIC METABOLIC PANEL
ANION GAP: 4 — AB (ref 5–15)
BUN: 19 mg/dL (ref 8–23)
CALCIUM: 6.6 mg/dL — AB (ref 8.9–10.3)
CO2: 28 mmol/L (ref 22–32)
Chloride: 110 mmol/L (ref 98–111)
Creatinine, Ser: 0.58 mg/dL — ABNORMAL LOW (ref 0.61–1.24)
GFR calc Af Amer: >60 mL/min (ref >60)
GFR calc non Af Amer: >60 mL/min (ref >60)
GLUCOSE: 172 mg/dL — AB (ref 70–99)
POTASSIUM: 2.7 mmol/L — AB (ref 3.5–5.1)
Sodium: 142 mmol/L (ref 135–145)

## 2018-09-07 LAB — GLUCOSE, CAPILLARY
GLUCOSE-CAPILLARY: 105 mg/dL — AB (ref 70–99)
Glucose-Capillary: 103 mg/dL — ABNORMAL HIGH (ref 70–99)
Glucose-Capillary: 115 mg/dL — ABNORMAL HIGH (ref 70–99)
Glucose-Capillary: 115 mg/dL — ABNORMAL HIGH (ref 70–99)
Glucose-Capillary: 123 mg/dL — ABNORMAL HIGH (ref 70–99)
Glucose-Capillary: 88 mg/dL (ref 70–99)

## 2018-09-07 LAB — CBC WITH DIFFERENTIAL/PLATELET
BASOS ABS: 0 10*3/uL (ref 0.0–0.1)
Basophils Relative: 0 %
Eosinophils Absolute: 0.2 10*3/uL (ref 0.0–0.7)
Eosinophils Relative: 1 %
HEMATOCRIT: 27.2 % — AB (ref 39.0–52.0)
Hemoglobin: 8.4 g/dL — ABNORMAL LOW (ref 13.0–17.0)
LYMPHS PCT: 8 %
Lymphs Abs: 0.9 10*3/uL (ref 0.7–4.0)
MCH: 29 pg (ref 26.0–34.0)
MCHC: 30.9 g/dL (ref 30.0–36.0)
MCV: 93.8 fL (ref 78.0–100.0)
MONO ABS: 0.8 10*3/uL (ref 0.1–1.0)
MONOS PCT: 7 %
NEUTROS ABS: 9.8 10*3/uL — AB (ref 1.7–7.7)
Neutrophils Relative %: 84 %
Platelets: 108 10*3/uL — ABNORMAL LOW (ref 150–400)
RBC: 2.9 MIL/uL — ABNORMAL LOW (ref 4.22–5.81)
RDW: 15.8 % — AB (ref 11.5–15.5)
WBC: 11.6 10*3/uL — ABNORMAL HIGH (ref 4.0–10.5)

## 2018-09-07 LAB — MAGNESIUM: Magnesium: 1.5 mg/dL — ABNORMAL LOW (ref 1.7–2.4)

## 2018-09-07 LAB — PHOSPHORUS: Phosphorus: 2.5 mg/dL (ref 2.5–4.6)

## 2018-09-07 MED ORDER — POTASSIUM CHLORIDE 10 MEQ/50ML IV SOLN
10.0000 meq | INTRAVENOUS | Status: AC
  Administered 2018-09-07 (×6): 10 meq via INTRAVENOUS
  Filled 2018-09-07 (×7): qty 50

## 2018-09-07 MED ORDER — MAGNESIUM SULFATE 4 GM/100ML IV SOLN
4.0000 g | Freq: Once | INTRAVENOUS | Status: AC
  Administered 2018-09-07: 4 g via INTRAVENOUS
  Filled 2018-09-07: qty 100

## 2018-09-07 NOTE — Progress Notes (Signed)
PT Cancellation Note  Patient Details Name: [name] MRN: [id number] DOB: [birth year]   Cancelled Treatment:    Reason Eval/Treat Not Completed: Medical issues which prohibited therapy , patient on drip for BP control. Check back another time.  Claretha Cooper 09/07/2018, 12:59 PM  Tresa Endo PT [phone]

## 2018-09-07 NOTE — Progress Notes (Signed)
CRITICAL VALUE ALERT  Critical Value:  2.7 Potassium   Date & Time Notied:  8307; 09/07/2018  Provider Notified: Chase Caller MD  Orders Received/Actions taken: Potassium and magnesium IVPB

## 2018-09-07 NOTE — Progress Notes (Signed)
Referring Physician(s): Dr. Jimmey Ralph  Supervising Physician: Jacqulynn Cadet  Patient Status:  [facility] - In-pt  Chief Complaint: GI Bleed  Subjective: NGT replaced, now with dark red output from tube.  Allergies: Ace inhibitors; Angiotensin receptor blockers; and Metformin  Medications: Prior to Admission medications   Medication Sig Start Date End Date Taking? Authorizing Provider  aspirin 81 MG chewable tablet Chew 81 mg by mouth every other day.    Yes [provider]  collagenase (SANTYL) ointment Apply 1 application topically daily.   Yes [provider]  ferrous sulfate 325 (65 FE) MG tablet Take 1 tablet (325 mg total) by mouth 2 (two) times daily with a meal. 08/07/18  Yes Anderson, Chelsey L, DO  FLOVENT HFA 220 MCG/ACT inhaler INHALE 1 PUFF INTO THE LUNGS TWICE DAILY Patient taking differently: Inhale 1 puff into the lungs 2 (two) times daily.  10/02/17  Yes Mikell, Jeani Sow, MD  fluticasone (FLONASE) 50 MCG/ACT nasal spray Place 2 sprays into both nostrils daily.   Yes [provider]  Infant Care Products (DERMACLOUD) CREA Apply 1 application topically 3 (three) times daily.   Yes [provider]  latanoprost (XALATAN) 0.005 % ophthalmic solution INSTILL 1 DROP IN BOTH EYES EVERY EVENING FOR GLAUCOMA Patient taking differently: Place 1 drop into both eyes at bedtime.  07/08/18  Yes [place] Bing, DO  losartan (COZAAR) 25 MG tablet Take 1 tablet (25 mg total) by mouth daily. 08/08/18  Yes Anderson, Chelsey L, DO  metoprolol tartrate (LOPRESSOR) 25 MG tablet Take 0.5 tablets (12.5 mg total) by mouth 2 (two) times daily. 08/07/18  Yes Anderson, Chelsey L, DO  nystatin (MYCOSTATIN/NYSTOP) powder Apply topically 2 (two) times daily. 07/13/18  Yes [place] Bing, DO  pravastatin (PRAVACHOL) 40 MG tablet TAKE 1 TABLET BY MOUTH DAILY 12/26/17  Yes Mikell, Jeani Sow, MD  Probiotic Product (PROBIOTIC PO) Take 2 capsules by mouth daily.    Yes [provider]  tamsulosin (FLOMAX) 0.4 MG CAPS capsule TAKE 2 CAPSULES BY MOUTH EVERY MORNING Patient taking differently: Take 0.8 mg by mouth daily.  07/08/18  Yes [place] Bing, DO  torsemide (DEMADEX) 20 MG tablet Take 2 tablets (40 mg total) by mouth daily. 08/08/18  Yes Anderson, Chelsey L, DO  urea 10 % lotion Apply topically 2 (two) times daily. 08/07/18  Yes Anderson, Chelsey L, DO  Vancomycin HCl (FIRVANQ) 50 MG/ML SOLR Take 2.5 mLs by mouth 4 (four) times daily.   Yes [provider]  famotidine (PEPCID) 20 MG tablet Take 2 tablets (40 mg total) by mouth daily. 08/23/18   Georgette Shell, MD  Hydrocortisone (GERHARDT'S BUTT CREAM) CREA Apply 1 application topically 3 (three) times daily. Patient not taking: Reported on 08/19/2018 08/07/18   Doristine Mango L, DO  mometasone (NASONEX) 50 MCG/ACT nasal spray INSTILL 2 SPRAYS INTO THE NOSE DAILY Patient not taking: Reported on 08/19/2018 07/08/18   [place] Bing, DO  PROAIR HFA 108 (90 Base) MCG/ACT inhaler INHALE 2 PUFFS BY MOUTH EVERY 4 HOURS AS NEEDED FOR WHEEZING OR SHORTNESS OF BREATH Patient taking differently: Inhale 2 puffs into the lungs every 4 (four) hours as needed for wheezing or shortness of breath.  04/18/18   Mikell, Jeani Sow, MD  traMADol (ULTRAM) 50 MG tablet Take 1 tablet (50 mg total) by mouth every 6 (six) hours as needed for moderate pain or severe pain. 08/22/18   Georgette Shell, MD  vancomycin (VANCOCIN) 50 mg/mL oral  solution Take 2.5 mLs (125 mg total) by mouth 4 (four) times daily. 08/22/18   Georgette Shell, MD     Vital Signs: BP (!) 114/94   Pulse 68   Temp (!) 97.4 F (36.3 C) (Axillary)   Resp 17   Ht 6' (1.829 m)   Wt (!) 316 lb 5.8 oz (143.5 kg)   SpO2 100%   BMI 42.91 kg/m   Physical Exam  Constitutional: He appears well-developed. No distress.  Skin: He is not diaphoretic.  Nursing note and vitals reviewed. Abdomen: soft, non-distended,  non-tender Groin: procedure site soft.  Bandage left in place.  Non-tender. No evidence of bruising, hematoma, or pseudoaneurysm.  Imaging: Dg Abd 1 View  Result Date: 09/07/2018 CLINICAL DATA:  NG tube placement. EXAM: ABDOMEN - 1 VIEW COMPARISON:  09/05/2018 FINDINGS: Nonobstructive bowel gas pattern. Enteric catheter tip overlies the expected location of the gastric antrum. Embolization coils noted in the right upper quadrant of the abdomen. IMPRESSION: Enteric catheter tip at the expected location of the gastric antrum. Electronically Signed   By: Fidela Salisbury M.D.   On: 09/07/2018 09:30   Dg Abd 1 View  Result Date: 09/05/2018 CLINICAL DATA:  Enteric tube placement. EXAM: ABDOMEN - 1 VIEW COMPARISON:  Abdominal x-ray dated August 30, 2018. FINDINGS: Enteric tube tip in the region of the proximal duodenum. Nonobstructive bowel gas pattern. Interval coil embolization of the gastroduodenal artery. No radiopaque calculi or other significant abnormality. No acute osseous abnormality. Unchanged left basilar airspace disease and small left pleural effusion. IMPRESSION: 1. Enteric tube tip in the region of the proximal duodenum. 2. Unchanged left basilar airspace disease and small left pleural effusion. Electronically Signed   By: Titus Dubin M.D.   On: 09/05/2018 09:25   Ir Angiogram Visceral Selective  Result Date: 09/05/2018 INDICATION: 77 year old male with a history of acute, life-threatening recurrent upper GI bleeding. EXAM: ULTRASOUND GUIDED ACCESS RIGHT COMMON FEMORAL ARTERY MESENTERIC ANGIOGRAM COIL EMBOLIZATION GASTRODUODENAL ARTERY DEPLOYMENT OF EXOSEAL FOR HEMOSTASIS MEDICATIONS: None ANESTHESIA/SEDATION: Moderate (conscious) sedation was employed during this procedure. A total of Versed 1.0 mg and Fentanyl 50 mcg was administered intravenously. Moderate Sedation Time: 30 minutes. The patient's level of consciousness and vital signs were monitored continuously by radiology nursing  throughout the procedure under my direct supervision. CONTRAST:  13mL OMNIPAQUE IOHEXOL 300 MG/ML SOLN, 36mL OMNIPAQUE IOHEXOL 300 MG/ML SOLN FLUOROSCOPY TIME:  Fluoroscopy Time: 8 minutes 6 seconds (1,192 mGy). COMPLICATIONS: None PROCEDURE: Informed consent was obtained from the patient following explanation of the procedure, risks, benefits and alternatives. The patient understands, agrees and consents for the procedure. All questions were addressed. A time out was performed prior to the initiation of the procedure. Maximal barrier sterile technique utilized including caps, mask, sterile gowns, sterile gloves, large sterile drape, hand hygiene, and Betadine prep. Ultrasound survey of the right inguinal region was performed with images stored and sent to PACs, confirming patency of the vessel. A micropuncture needle was used access the right common femoral artery under ultrasound. With excellent arterial blood flow returned, and an .018 micro wire was passed through the needle, observed enter the abdominal aorta under fluoroscopy. The needle was removed, and a micropuncture sheath was placed over the wire. The inner dilator and wire were removed, and an 035 Bentson wire was advanced under fluoroscopy into the abdominal aorta. The sheath was removed and a standard 5 [place] vascular sheath was placed. The dilator was removed and the sheath was flushed. Cobra catheter was  advanced over the Bentson wire into the abdominal aorta. Catheter was used to select the celiac artery. Angiogram was performed. Catheter was withdrawn to the superior mesenteric artery. Angiogram was performed. Catheter was then used to select the celiac artery and a Glidewire was used to place the base catheter into the common hepatic artery, just at the origin of the gastroduodenal artery. Angiogram was performed. Microcatheter was then advanced into the gastroduodenal artery. Angiogram was performed. Empiric coil embolization was then performed  within the gastroduodenal artery, extending to the origin of the GDA. Repeat angiogram was performed. Catheters and wires were removed, and an Exoseal was deployed at the right common femoral artery access for hemostasis. Patient tolerated the procedure well. Hemodynamics were unchanged during the course of the procedure, as the patient remained on pressor support. No complications encountered. No significant blood loss. IMPRESSION: Status post ultrasound guided access right common femoral artery for mesenteric angiogram and empiric coil embolization of the gastroduodenal artery for acute life-threatening upper GI hemorrhage. Signed, Dulcy Fanny. Dellia Nims, RPVI [facility] Inter[facility] Rad[facility] Special[facility] Radiology Electronically Signed   By: Corrie Mckusick D.O.   On: 09/05/2018 08:13   Ir Angiogram Visceral Selective  Result Date: 09/05/2018 INDICATION: 77 year old male with a history of acute, life-threatening recurrent upper GI bleeding. EXAM: ULTRASOUND GUIDED ACCESS RIGHT COMMON FEMORAL ARTERY MESENTERIC ANGIOGRAM COIL EMBOLIZATION GASTRODUODENAL ARTERY DEPLOYMENT OF EXOSEAL FOR HEMOSTASIS MEDICATIONS: None ANESTHESIA/SEDATION: Moderate (conscious) sedation was employed during this procedure. A total of Versed 1.0 mg and Fentanyl 50 mcg was administered intravenously. Moderate Sedation Time: 30 minutes. The patient's level of consciousness and vital signs were monitored continuously by radiology nursing throughout the procedure under my direct supervision. CONTRAST:  24mL OMNIPAQUE IOHEXOL 300 MG/ML SOLN, 4mL OMNIPAQUE IOHEXOL 300 MG/ML SOLN FLUOROSCOPY TIME:  Fluoroscopy Time: 8 minutes 6 seconds (1,192 mGy). COMPLICATIONS: None PROCEDURE: Informed consent was obtained from the patient following explanation of the procedure, risks, benefits and alternatives. The patient understands, agrees and consents for the procedure. All questions were addressed. A time out was performed prior to the  initiation of the procedure. Maximal barrier sterile technique utilized including caps, mask, sterile gowns, sterile gloves, large sterile drape, hand hygiene, and Betadine prep. Ultrasound survey of the right inguinal region was performed with images stored and sent to PACs, confirming patency of the vessel. A micropuncture needle was used access the right common femoral artery under ultrasound. With excellent arterial blood flow returned, and an .018 micro wire was passed through the needle, observed enter the abdominal aorta under fluoroscopy. The needle was removed, and a micropuncture sheath was placed over the wire. The inner dilator and wire were removed, and an 035 Bentson wire was advanced under fluoroscopy into the abdominal aorta. The sheath was removed and a standard 5 [place] vascular sheath was placed. The dilator was removed and the sheath was flushed. Cobra catheter was advanced over the Bentson wire into the abdominal aorta. Catheter was used to select the celiac artery. Angiogram was performed. Catheter was withdrawn to the superior mesenteric artery. Angiogram was performed. Catheter was then used to select the celiac artery and a Glidewire was used to place the base catheter into the common hepatic artery, just at the origin of the gastroduodenal artery. Angiogram was performed. Microcatheter was then advanced into the gastroduodenal artery. Angiogram was performed. Empiric coil embolization was then performed within the gastroduodenal artery, extending to the origin of the GDA. Repeat angiogram was performed. Catheters and wires were removed,  and an Exoseal was deployed at the right common femoral artery access for hemostasis. Patient tolerated the procedure well. Hemodynamics were unchanged during the course of the procedure, as the patient remained on pressor support. No complications encountered. No significant blood loss. IMPRESSION: Status post ultrasound guided access right common femoral  artery for mesenteric angiogram and empiric coil embolization of the gastroduodenal artery for acute life-threatening upper GI hemorrhage. Signed, Dulcy Fanny. Dellia Nims, RPVI Vascular and Interventional Radiology Specialists [facility] Radiology Electronically Signed   By: Corrie Mckusick D.O.   On: 09/05/2018 08:13   Ir Angiogram Selective Each Additional Vessel  Result Date: 09/05/2018 INDICATION: 77 year old male with a history of acute, life-threatening recurrent upper GI bleeding. EXAM: ULTRASOUND GUIDED ACCESS RIGHT COMMON FEMORAL ARTERY MESENTERIC ANGIOGRAM COIL EMBOLIZATION GASTRODUODENAL ARTERY DEPLOYMENT OF EXOSEAL FOR HEMOSTASIS MEDICATIONS: None ANESTHESIA/SEDATION: Moderate (conscious) sedation was employed during this procedure. A total of Versed 1.0 mg and Fentanyl 50 mcg was administered intravenously. Moderate Sedation Time: 30 minutes. The patient's level of consciousness and vital signs were monitored continuously by radiology nursing throughout the procedure under my direct supervision. CONTRAST:  29mL OMNIPAQUE IOHEXOL 300 MG/ML SOLN, 42mL OMNIPAQUE IOHEXOL 300 MG/ML SOLN FLUOROSCOPY TIME:  Fluoroscopy Time: 8 minutes 6 seconds (1,192 mGy). COMPLICATIONS: None PROCEDURE: Informed consent was obtained from the patient following explanation of the procedure, risks, benefits and alternatives. The patient understands, agrees and consents for the procedure. All questions were addressed. A time out was performed prior to the initiation of the procedure. Maximal barrier sterile technique utilized including caps, mask, sterile gowns, sterile gloves, large sterile drape, hand hygiene, and Betadine prep. Ultrasound survey of the right inguinal region was performed with images stored and sent to PACs, confirming patency of the vessel. A micropuncture needle was used access the right common femoral artery under ultrasound. With excellent arterial blood flow returned, and an .018 micro wire was passed through  the needle, observed enter the abdominal aorta under fluoroscopy. The needle was removed, and a micropuncture sheath was placed over the wire. The inner dilator and wire were removed, and an 035 Bentson wire was advanced under fluoroscopy into the abdominal aorta. The sheath was removed and a standard 5 [place] vascular sheath was placed. The dilator was removed and the sheath was flushed. Cobra catheter was advanced over the Bentson wire into the abdominal aorta. Catheter was used to select the celiac artery. Angiogram was performed. Catheter was withdrawn to the superior mesenteric artery. Angiogram was performed. Catheter was then used to select the celiac artery and a Glidewire was used to place the base catheter into the common hepatic artery, just at the origin of the gastroduodenal artery. Angiogram was performed. Microcatheter was then advanced into the gastroduodenal artery. Angiogram was performed. Empiric coil embolization was then performed within the gastroduodenal artery, extending to the origin of the GDA. Repeat angiogram was performed. Catheters and wires were removed, and an Exoseal was deployed at the right common femoral artery access for hemostasis. Patient tolerated the procedure well. Hemodynamics were unchanged during the course of the procedure, as the patient remained on pressor support. No complications encountered. No significant blood loss. IMPRESSION: Status post ultrasound guided access right common femoral artery for mesenteric angiogram and empiric coil embolization of the gastroduodenal artery for acute life-threatening upper GI hemorrhage. Signed, Dulcy Fanny. Dellia Nims, RPVI Vascular and Interventional Radiology Specialists [facility] Radiology Electronically Signed   By: Corrie Mckusick D.O.   On: 09/05/2018 08:13   Ir Angiogram Selective  Each Additional Vessel  Result Date: 09/05/2018 INDICATION: 77 year old male with a history of acute, life-threatening recurrent upper GI  bleeding. EXAM: ULTRASOUND GUIDED ACCESS RIGHT COMMON FEMORAL ARTERY MESENTERIC ANGIOGRAM COIL EMBOLIZATION GASTRODUODENAL ARTERY DEPLOYMENT OF EXOSEAL FOR HEMOSTASIS MEDICATIONS: None ANESTHESIA/SEDATION: Moderate (conscious) sedation was employed during this procedure. A total of Versed 1.0 mg and Fentanyl 50 mcg was administered intravenously. Moderate Sedation Time: 30 minutes. The patient's level of consciousness and vital signs were monitored continuously by radiology nursing throughout the procedure under my direct supervision. CONTRAST:  66mL OMNIPAQUE IOHEXOL 300 MG/ML SOLN, 66mL OMNIPAQUE IOHEXOL 300 MG/ML SOLN FLUOROSCOPY TIME:  Fluoroscopy Time: 8 minutes 6 seconds (1,192 mGy). COMPLICATIONS: None PROCEDURE: Informed consent was obtained from the patient following explanation of the procedure, risks, benefits and alternatives. The patient understands, agrees and consents for the procedure. All questions were addressed. A time out was performed prior to the initiation of the procedure. Maximal barrier sterile technique utilized including caps, mask, sterile gowns, sterile gloves, large sterile drape, hand hygiene, and Betadine prep. Ultrasound survey of the right inguinal region was performed with images stored and sent to PACs, confirming patency of the vessel. A micropuncture needle was used access the right common femoral artery under ultrasound. With excellent arterial blood flow returned, and an .018 micro wire was passed through the needle, observed enter the abdominal aorta under fluoroscopy. The needle was removed, and a micropuncture sheath was placed over the wire. The inner dilator and wire were removed, and an 035 Bentson wire was advanced under fluoroscopy into the abdominal aorta. The sheath was removed and a standard 5 [place] vascular sheath was placed. The dilator was removed and the sheath was flushed. Cobra catheter was advanced over the Bentson wire into the abdominal aorta. Catheter  was used to select the celiac artery. Angiogram was performed. Catheter was withdrawn to the superior mesenteric artery. Angiogram was performed. Catheter was then used to select the celiac artery and a Glidewire was used to place the base catheter into the common hepatic artery, just at the origin of the gastroduodenal artery. Angiogram was performed. Microcatheter was then advanced into the gastroduodenal artery. Angiogram was performed. Empiric coil embolization was then performed within the gastroduodenal artery, extending to the origin of the GDA. Repeat angiogram was performed. Catheters and wires were removed, and an Exoseal was deployed at the right common femoral artery access for hemostasis. Patient tolerated the procedure well. Hemodynamics were unchanged during the course of the procedure, as the patient remained on pressor support. No complications encountered. No significant blood loss. IMPRESSION: Status post ultrasound guided access right common femoral artery for mesenteric angiogram and empiric coil embolization of the gastroduodenal artery for acute life-threatening upper GI hemorrhage. Signed, Dulcy Fanny. Dellia Nims, RPVI Vascular and Interventional Radiology Specialists [facility] Radiology Electronically Signed   By: Corrie Mckusick D.O.   On: 09/05/2018 08:13   Ir Angiogram Selective Each Additional Vessel  Result Date: 09/05/2018 INDICATION: 77 year old male with a history of acute, life-threatening recurrent upper GI bleeding. EXAM: ULTRASOUND GUIDED ACCESS RIGHT COMMON FEMORAL ARTERY MESENTERIC ANGIOGRAM COIL EMBOLIZATION GASTRODUODENAL ARTERY DEPLOYMENT OF EXOSEAL FOR HEMOSTASIS MEDICATIONS: None ANESTHESIA/SEDATION: Moderate (conscious) sedation was employed during this procedure. A total of Versed 1.0 mg and Fentanyl 50 mcg was administered intravenously. Moderate Sedation Time: 30 minutes. The patient's level of consciousness and vital signs were monitored continuously by radiology  nursing throughout the procedure under my direct supervision. CONTRAST:  62mL OMNIPAQUE IOHEXOL 300 MG/ML SOLN, 63mL OMNIPAQUE IOHEXOL  300 MG/ML SOLN FLUOROSCOPY TIME:  Fluoroscopy Time: 8 minutes 6 seconds (1,192 mGy). COMPLICATIONS: None PROCEDURE: Informed consent was obtained from the patient following explanation of the procedure, risks, benefits and alternatives. The patient understands, agrees and consents for the procedure. All questions were addressed. A time out was performed prior to the initiation of the procedure. Maximal barrier sterile technique utilized including caps, mask, sterile gowns, sterile gloves, large sterile drape, hand hygiene, and Betadine prep. Ultrasound survey of the right inguinal region was performed with images stored and sent to PACs, confirming patency of the vessel. A micropuncture needle was used access the right common femoral artery under ultrasound. With excellent arterial blood flow returned, and an .018 micro wire was passed through the needle, observed enter the abdominal aorta under fluoroscopy. The needle was removed, and a micropuncture sheath was placed over the wire. The inner dilator and wire were removed, and an 035 Bentson wire was advanced under fluoroscopy into the abdominal aorta. The sheath was removed and a standard 5 [place] vascular sheath was placed. The dilator was removed and the sheath was flushed. Cobra catheter was advanced over the Bentson wire into the abdominal aorta. Catheter was used to select the celiac artery. Angiogram was performed. Catheter was withdrawn to the superior mesenteric artery. Angiogram was performed. Catheter was then used to select the celiac artery and a Glidewire was used to place the base catheter into the common hepatic artery, just at the origin of the gastroduodenal artery. Angiogram was performed. Microcatheter was then advanced into the gastroduodenal artery. Angiogram was performed. Empiric coil embolization was then  performed within the gastroduodenal artery, extending to the origin of the GDA. Repeat angiogram was performed. Catheters and wires were removed, and an Exoseal was deployed at the right common femoral artery access for hemostasis. Patient tolerated the procedure well. Hemodynamics were unchanged during the course of the procedure, as the patient remained on pressor support. No complications encountered. No significant blood loss. IMPRESSION: Status post ultrasound guided access right common femoral artery for mesenteric angiogram and empiric coil embolization of the gastroduodenal artery for acute life-threatening upper GI hemorrhage. Signed, Dulcy Fanny. Dellia Nims, RPVI Vascular and Interventional Radiology Specialists [facility] Radiology Electronically Signed   By: Corrie Mckusick D.O.   On: 09/05/2018 08:13   Ir US Guide Vasc Access Right  Result Date: 09/05/2018 INDICATION: 77 year old male with a history of acute, life-threatening recurrent upper GI bleeding. EXAM: ULTRASOUND GUIDED ACCESS RIGHT COMMON FEMORAL ARTERY MESENTERIC ANGIOGRAM COIL EMBOLIZATION GASTRODUODENAL ARTERY DEPLOYMENT OF EXOSEAL FOR HEMOSTASIS MEDICATIONS: None ANESTHESIA/SEDATION: Moderate (conscious) sedation was employed during this procedure. A total of Versed 1.0 mg and Fentanyl 50 mcg was administered intravenously. Moderate Sedation Time: 30 minutes. The patient's level of consciousness and vital signs were monitored continuously by radiology nursing throughout the procedure under my direct supervision. CONTRAST:  81mL OMNIPAQUE IOHEXOL 300 MG/ML SOLN, 4mL OMNIPAQUE IOHEXOL 300 MG/ML SOLN FLUOROSCOPY TIME:  Fluoroscopy Time: 8 minutes 6 seconds (1,192 mGy). COMPLICATIONS: None PROCEDURE: Informed consent was obtained from the patient following explanation of the procedure, risks, benefits and alternatives. The patient understands, agrees and consents for the procedure. All questions were addressed. A time out was performed prior to  the initiation of the procedure. Maximal barrier sterile technique utilized including caps, mask, sterile gowns, sterile gloves, large sterile drape, hand hygiene, and Betadine prep. Ultrasound survey of the right inguinal region was performed with images stored and sent to PACs, confirming patency of the vessel. A micropuncture  needle was used access the right common femoral artery under ultrasound. With excellent arterial blood flow returned, and an .018 micro wire was passed through the needle, observed enter the abdominal aorta under fluoroscopy. The needle was removed, and a micropuncture sheath was placed over the wire. The inner dilator and wire were removed, and an 035 Bentson wire was advanced under fluoroscopy into the abdominal aorta. The sheath was removed and a standard 5 [place] vascular sheath was placed. The dilator was removed and the sheath was flushed. Cobra catheter was advanced over the Bentson wire into the abdominal aorta. Catheter was used to select the celiac artery. Angiogram was performed. Catheter was withdrawn to the superior mesenteric artery. Angiogram was performed. Catheter was then used to select the celiac artery and a Glidewire was used to place the base catheter into the common hepatic artery, just at the origin of the gastroduodenal artery. Angiogram was performed. Microcatheter was then advanced into the gastroduodenal artery. Angiogram was performed. Empiric coil embolization was then performed within the gastroduodenal artery, extending to the origin of the GDA. Repeat angiogram was performed. Catheters and wires were removed, and an Exoseal was deployed at the right common femoral artery access for hemostasis. Patient tolerated the procedure well. Hemodynamics were unchanged during the course of the procedure, as the patient remained on pressor support. No complications encountered. No significant blood loss. IMPRESSION: Status post ultrasound guided access right common  femoral artery for mesenteric angiogram and empiric coil embolization of the gastroduodenal artery for acute life-threatening upper GI hemorrhage. Signed, Dulcy Fanny. Dellia Nims, RPVI Vascular and Interventional Radiology Specialists [facility] Radiology Electronically Signed   By: Corrie Mckusick D.O.   On: 09/05/2018 08:13   Dg Chest Port 1 View  Result Date: 09/06/2018 CLINICAL DATA:  Acute respiratory failure. EXAM: PORTABLE CHEST 1 VIEW COMPARISON:  CT of the chest 09/02/2018 FINDINGS: Interval placement of enteric catheter, tip collimated off the image. Stable position of right-sided PICC line. Mildly enlarged cardiac silhouette. No evidence of pleural effusion. Bilateral lower lobe predominant streaky airspace opacities. Probable bilateral pleural effusions. Osseous structures are without acute abnormality. Soft tissues are grossly normal. IMPRESSION: Bilateral lower lobe predominant streaky airspace opacities may represent atelectasis versus airspace consolidation. Probable small bilateral pleural effusions. Electronically Signed   By: Fidela Salisbury M.D.   On: 09/06/2018 07:16   Otho Guide Roadmapping  Result Date: 09/05/2018 INDICATION: 77 year old male with a history of acute, life-threatening recurrent upper GI bleeding. EXAM: ULTRASOUND GUIDED ACCESS RIGHT COMMON FEMORAL ARTERY MESENTERIC ANGIOGRAM COIL EMBOLIZATION GASTRODUODENAL ARTERY DEPLOYMENT OF EXOSEAL FOR HEMOSTASIS MEDICATIONS: None ANESTHESIA/SEDATION: Moderate (conscious) sedation was employed during this procedure. A total of Versed 1.0 mg and Fentanyl 50 mcg was administered intravenously. Moderate Sedation Time: 30 minutes. The patient's level of consciousness and vital signs were monitored continuously by radiology nursing throughout the procedure under my direct supervision. CONTRAST:  61mL OMNIPAQUE IOHEXOL 300 MG/ML SOLN, 65mL OMNIPAQUE IOHEXOL 300 MG/ML SOLN FLUOROSCOPY TIME:  Fluoroscopy  Time: 8 minutes 6 seconds (1,192 mGy). COMPLICATIONS: None PROCEDURE: Informed consent was obtained from the patient following explanation of the procedure, risks, benefits and alternatives. The patient understands, agrees and consents for the procedure. All questions were addressed. A time out was performed prior to the initiation of the procedure. Maximal barrier sterile technique utilized including caps, mask, sterile gowns, sterile gloves, large sterile drape, hand hygiene, and Betadine prep. Ultrasound survey of the right inguinal region was  performed with images stored and sent to PACs, confirming patency of the vessel. A micropuncture needle was used access the right common femoral artery under ultrasound. With excellent arterial blood flow returned, and an .018 micro wire was passed through the needle, observed enter the abdominal aorta under fluoroscopy. The needle was removed, and a micropuncture sheath was placed over the wire. The inner dilator and wire were removed, and an 035 Bentson wire was advanced under fluoroscopy into the abdominal aorta. The sheath was removed and a standard 5 [place] vascular sheath was placed. The dilator was removed and the sheath was flushed. Cobra catheter was advanced over the Bentson wire into the abdominal aorta. Catheter was used to select the celiac artery. Angiogram was performed. Catheter was withdrawn to the superior mesenteric artery. Angiogram was performed. Catheter was then used to select the celiac artery and a Glidewire was used to place the base catheter into the common hepatic artery, just at the origin of the gastroduodenal artery. Angiogram was performed. Microcatheter was then advanced into the gastroduodenal artery. Angiogram was performed. Empiric coil embolization was then performed within the gastroduodenal artery, extending to the origin of the GDA. Repeat angiogram was performed. Catheters and wires were removed, and an Exoseal was deployed at the  right common femoral artery access for hemostasis. Patient tolerated the procedure well. Hemodynamics were unchanged during the course of the procedure, as the patient remained on pressor support. No complications encountered. No significant blood loss. IMPRESSION: Status post ultrasound guided access right common femoral artery for mesenteric angiogram and empiric coil embolization of the gastroduodenal artery for acute life-threatening upper GI hemorrhage. Signed, Dulcy Fanny. Dellia Nims, RPVI Vascular and Interventional Radiology Specialists [facility] Radiology Electronically Signed   By: Corrie Mckusick D.O.   On: 09/05/2018 08:13    Labs:  CBC: Recent Labs    09/06/18 2000 09/07/18 0258 09/07/18 0500 09/07/18 0800  WBC 14.0* 12.7* 10.2 10.7*  HGB 9.2* 9.0* 7.8* 7.8*  HCT 28.4* 27.4* 24.7* 24.5*  PLT 108* 105* 98* 93*    COAGS: Recent Labs    08/26/18 1643 09/02/18 1405 09/05/18 0249 09/05/18 0904  INR 1.21 1.03 1.38 1.13  APTT 30 36 40*  --     BMP: Recent Labs    09/03/18 1253 09/05/18 0249 09/06/18 0333 09/07/18 0500  NA 142 142 148* 142  K 3.8 4.0 3.9 2.7*  CL 100 96* 105 110  CO2 35* 36* 34* 28  GLUCOSE 166* 186* 169* 172*  BUN 25* 33* 32* 19  CALCIUM 8.1* 7.4* 7.8* 6.6*  CREATININE 1.05 1.28* 0.87 0.58*  GFRNONAA >60 52* >60 >60  GFRAA >60 >60 >60 >60    LIVER FUNCTION TESTS: Recent Labs    08/27/18 1145 08/28/18 0525 08/30/18 0430 09/01/18 0458  BILITOT 0.6 0.7 0.6 0.5  AST 10* 10* 9* 10*  ALT 7 7 8 7   ALKPHOS 69 75 73 84  PROT 5.1* 5.5* 5.3* 5.6*  ALBUMIN 2.0* 2.2* 2.0* 2.0*    Assessment and Plan: GI bleed s/p embolization 09/05/18 by Dr. Earleen Newport Patient still with some dark, bloody output from NG tube during visit. HgB back down to 7.8 Denies bloody bowel movements; RN confirms.  Groin soft s/p procedure.  Will discuss with MD.   Electronically Signed: Docia Barrier, PA 09/07/2018, 12:23 PM   I spent a total of 15 Minutes at  the the patient's bedside AND on the patient's hospital floor or unit, greater than 50% of which was  counseling/coordinating care for GI bleed.

## 2018-09-07 NOTE — Progress Notes (Signed)
Pt. remains with NG in place R nare, remains on n/c oxygen, RT to monitor Pt.

## 2018-09-07 NOTE — Progress Notes (Signed)
NG tube had come loose from nose. Out about 5 inches. Replaced and ordered KUB for placement.

## 2018-09-07 NOTE — Progress Notes (Signed)
[name]  AJG:[id number] DOB: June 10, 1941 DOA: 08/19/2018 PCP: Cruger Bing, DO    LOS: 19 days   Reason for Consult / Chief Complaint:  GI bleed  Consulting MD and date:  [name] MD- 8/28  HPI/Summary of hospital stay:  77 year old nursing home resident with recent admission for septic shock, UTI.pneumonia/soft tissue infection from 7/30-8/9. Hospital course significant for heart failure, PEA arrest for 2 mins on 8/2, Developed C. difficile colitis in nursing home after discharge.     8/21- Admitted to ICU on 8/21 with black tarry stool, shock. GI consulted but patient declined scope. C DIFF PCR - POSITIVE. START PO VANC  GI 8/22- EGD 8/30> s/p epi, hemospray.  8/23- Transferred from ICU to [facility] 8/27- Developed dyspnea, tachypnea, large black stool. Palliative care 8/27- Made limited code 8/28- Transfer back to ICU for ongoing bleed, hypotension. PCCM re consulted.  PPI infusion started GI consulted again.  8/29: EGD- Large duodenal ulcers which were treated with epinephrine and hemo spray to achieve hemostasis. STOP PO VANC   -  - EGD completed on 8/29: Large nonbleeding diverticulum found in the gastric fundus, no evidence of active bleeding.  Diffuse mild inflammation in the gastric body and gastric antrum.  Biopsies were taken.  2 nonobstructing oozing cratered duodenal ulcers with visible vessel found at the duodenal bulb.  Biopsies were taken.  Hemostasis was achieved with epinephrine and subsequent hemo-spray.  8/29 stomach particular biopsy>>> chronic gastritis 8/29 duodenal ulcer biopsies x2>>> focal acute inflammation. 9/4: Acute PE, started anticoagulation with heparin, transition to eliquis 9/6 9/7: Massive GIB, transfused, anticoagulation reversed. Embolized by IR. Transfer to ICU Status post 4 units PRBC, 2 units FFP, 10 mg vit k, adneexa, .  On neo, vaso, protonix drip 9/8: BP better, Pressors weaning dowm. More awake today.  Does not remember events of  the past 48 hours Denies any dyspnea, abdominal pain.  Subjective:   09/07/18 - off neo. Needing stress dose vaso. Not delirious. Not on vent. Having 400cc red Echeverri blood via NG overnight and additional 100cc since 7am. New black stools now + . Aniticoagulation on hold  Objective   Blood pressure (!) 114/94, pulse 68, temperature (!) 97.4 F (36.3 C), temperature source Axillary, resp. rate 17, height 6' (1.829 m), weight (!) 143.5 kg, SpO2 100 %.        Intake/Output Summary (Last 24 hours) at 09/07/2018 1319 Last data filed at 09/07/2018 0600 Gross per 24 hour  Intake 2214.96 ml  Output 1000 ml  Net 1214.96 ml   Filed Weights   09/05/18 0500 09/06/18 0440 09/07/18 0517  Weight: (!) 139.9 kg (!) 141.7 kg (!) 143.5 kg   Examination: General Appearance:  Looks unwell OBESE - + Head:  Normocephalic, without obvious abnormality, atraumatic Eyes:  PERRL - yes, conjunctiva/corneas - clear     Ears:  Normal external ear canals, both ears Nose:  G tube - YES - has dark blood Throat:  ETT TUBE - no , OG tube - no Neck:  Supple,  No enlargement/tenderness/nodules Lungs: Clear to auscultation bilaterally, Mildly tachypneic from morbid obesity Heart:  S1 and S2 normal, no murmur, CVP - no.  Pressors - no Abdomen:  Soft, no masses, no organomegaly Genitalia / Rectal:  Not done Extremities:  Extremities- edematous chronic3 Skin:  ntact in exposed areas . Neurologic:  Sedation - none -> RASS - 0 . Moves all 4s - yes. CAM-ICU - neg . Orientation - x3+  MSK: deconditioned     Labs    PULMONARY Recent Labs  Lab 09/01/18 0750 09/05/18 0328  PHART  --  7.338*  PCO2ART  --  66.0*  PO2ART  --  104  HCO3 32.1* 34.4*  O2SAT 62.4 98.1    CBC Recent Labs  Lab 09/07/18 0258 09/07/18 0500 09/07/18 0800  HGB 9.0* 7.8* 7.8*  HCT 27.4* 24.7* 24.5*  WBC 12.7* 10.2 10.7*  PLT 105* 98* 93*    COAGULATION Recent Labs  Lab 09/02/18 1405 09/05/18 0249 09/05/18 0904  INR 1.03 1.38  1.13    CARDIAC   Recent Labs  Lab 09/05/18 0249  TROPONINI <0.03   No results for input(s): PROBNP in the last 168 hours.   CHEMISTRY Recent Labs  Lab 09/01/18 0458 09/03/18 0450 09/03/18 1253 09/05/18 0249 09/06/18 0333 09/07/18 0500  NA 140 139 142 142 148* 142  K 4.1 4.1 3.8 4.0 3.9 2.7*  CL 104 98 100 96* 105 110  CO2 31 33* 35* 36* 34* 28  GLUCOSE 107* 116* 166* 186* 169* 172*  BUN 11 21 25* 33* 32* 19  CREATININE 0.71 0.97 1.05 1.28* 0.87 0.58*  CALCIUM 8.1* 8.1* 8.1* 7.4* 7.8* 6.6*  MG 1.7  --  1.5* 1.6* 1.7 1.5*  PHOS  --   --   --  3.3 3.2 2.5   Estimated Creatinine Clearance: 111.9 mL/min (A) (by C-G formula based on SCr of 0.58 mg/dL (L)).   LIVER Recent Labs  Lab 09/01/18 0458 09/02/18 1405 09/05/18 0249 09/05/18 0904  AST 10*  --   --   --   ALT 7  --   --   --   ALKPHOS 84  --   --   --   BILITOT 0.5  --   --   --   PROT 5.6*  --   --   --   ALBUMIN 2.0*  --   --   --   INR  --  1.03 1.38 1.13     INFECTIOUS Recent Labs  Lab 09/01/18 1128 09/02/18 0337 09/03/18 0450  PROCALCITON <0.10 <0.10 <0.10     ENDOCRINE CBG (last 3)  Recent Labs    09/07/18 0343 09/07/18 0806 09/07/18 1248  GLUCAP 115* 105* 115*         IMAGING x48h  - image(s) personally visualized  -   highlighted in bold Dg Abd 1 View  Result Date: 09/07/2018 CLINICAL DATA:  NG tube placement. EXAM: ABDOMEN - 1 VIEW COMPARISON:  09/05/2018 FINDINGS: Nonobstructive bowel gas pattern. Enteric catheter tip overlies the expected location of the gastric antrum. Embolization coils noted in the right upper quadrant of the abdomen. IMPRESSION: Enteric catheter tip at the expected location of the gastric antrum. Electronically Signed   By: Fidela Salisbury M.D.   On: 09/07/2018 09:30   Dg Chest Port 1 View  Result Date: 09/06/2018 CLINICAL DATA:  Acute respiratory failure. EXAM: PORTABLE CHEST 1 VIEW COMPARISON:  CT of the chest 09/02/2018 FINDINGS: Interval  placement of enteric catheter, tip collimated off the image. Stable position of right-sided PICC line. Mildly enlarged cardiac silhouette. No evidence of pleural effusion. Bilateral lower lobe predominant streaky airspace opacities. Probable bilateral pleural effusions. Osseous structures are without acute abnormality. Soft tissues are grossly normal. IMPRESSION: Bilateral lower lobe predominant streaky airspace opacities may represent atelectasis versus airspace consolidation. Probable small bilateral pleural effusions. Electronically Signed   By: Fidela Salisbury M.D.   On: 09/06/2018 07:16  Assessment & Plan:  77 year old with recurrent GI bleed, duodenal ulcer  Acute GI bleed with acute blood loss anemia in the setting of duodenal ulcers with visible blood vessels identified via EGD on 8/29, and treated with hemo-spray. S/p embolization again for recurrent massive bleed 09/05/18   09/07/2018 - concren for ongoing slow bleed   Plan: Recall GI Continue PPI drip Wean down pressors as tolerated. Follow CBC and transfuse as needed for bp/hr instability or hgb < 7gm%  - check stat cbc  Cardiomyopathy. EF 30-35%, status post recent cardiac arrest HTN  - 09/07/2018 - maintains BP/HR  Plan  Holding aspirin, Lopressor, Cozaar  Segmental pulmonary embolism Hold all anticoagulation.  plan Check LE dopplers.  May need IVC filter  H/O C. difficile colitis - C. difficile test shows positive antigen, negative toxin.  He is likely colonized at this point. S/p po vanc  Plan Monitor off oral vancomycin.   Diabetes  Plan Continue SSI  History of prostate adenocarcinoma Plan Follow-up with [facility] as outpatient  LE venous ulcers Plan  Continue routine wound care, Unna boots   Profound debilitation and weakness/deconditioning Plan PT when stable  Goals of care Plan Palliative care on board.  He is limited code.  Continue discussions.  Disposition / Summary of  Today's Plan 09/07/18   Continue to wean down pressors, transfuse as needed. Check lower extremity Dopplers. Recall GI  Best Practice / Goals of Care / Disposition.   DVT prophylaxis: SCDs, Unna boots GI prophylaxis: PPI drip Diet: NPO Mobility: Bedrest Code Status: Limited code, no CPR or intubation. Family Communication: No Family at bedside 9/8. No family at bedside 09/07/2018     ATTESTATION & SIGNATURE    The patient is critically ill with multiple organ systems failure and requires high complexity decision making for assessment and support, frequent evaluation and titration of therapies, application of advanced monitoring technologies and extensive interpretation of multiple databases.   Critical Care Time devoted to patient care services described in this note is  30  Minutes. This time reflects time of care of this signee Dr Brand Males. This critical care time does not reflect procedure time, or teaching time or supervisory time of PA/NP/Med student/Med Resident etc but could involve care discussion time     Dr. Brand Males, M.D., [facility].C.P Pulmonary and Critical Care Medicine Staff Physician [place] Pulmonary and Critical Care Pager: [phone], If no answer or between  15:00h - 7:00h: call [phone]  09/07/2018 2:02 PM

## 2018-09-07 NOTE — Progress Notes (Signed)
    Recent Labs  Lab 09/07/18 0500 09/07/18 0800 09/07/18 1341  HGB 7.8* 7.8* 8.4*  HCT 24.7* 24.5* 27.2*  WBC 10.2 10.7* 11.6*  PLT 98* 93* 108*   rept hgb stable BP/HR stable on low dose vasopresssin   Plan Continue to monitopr GI plans to scope 09/08/18 AM     SIGNATURE    Dr. Brand Males, M.D., F.C.C.P,  Pulmonary and Critical Care Medicine Staff Physician, [place] Director - Interstitial Lung Disease  Program  Pulmonary [place] at [place], [place], [address]  Pager: [phone], If no answer or between  15:00h - 7:00h: call [phone] Telephone: [phone]  5:53 PM 09/07/2018

## 2018-09-07 NOTE — Progress Notes (Signed)
Bilateral lower extremity venous duplex has been completed. Negative for obvious evidence of DVT. Results were given to Dr. Chase Caller.  09/07/18 2:39 PM [name] RVT

## 2018-09-07 NOTE — Progress Notes (Signed)
Case discussed with ICU team still with some subacute bleeding we'll plan on endoscopy tomorrow at 8 AM either at bedside or in our unit depending on his stability and hospital computer chart reviewed

## 2018-09-07 NOTE — Care Management Note (Signed)
Case Management Note  Patient Details  Name: [name] MRN: [id number] Date of Birth: 03/17/1941  Subjective/Objective:                  77 year old nursing home resident with recent admission for septic shock, UTI.pneumonia/soft tissue infection from 7/30-8/9. Hospital course significant for heart failure, PEA arrest for 2 mins on 8/2, Developed C. difficile colitis in nursing home after discharge.  8/21- Admitted to ICU on 8/21 with black tarry stool, shock. GI consulted but patient declined scope 8/23- Transferred from ICU to [facility] 8/27- Developed dyspnea, tachypnea, large black stool 8/28- Transfer back to ICU for ongoing bleed, hypotension. PCCM re consulted.  PPI infusion started GI consulted again 8/29: EGD- Large duodenal ulcers which were treated with epinephrine and hemo spray to achieve hemostasis.  9/4: Acute PE, started anticoagulation with heparin, transition to eliquis 9/6 9/7: Massive GIB, transfused, anticoagulation reversed. Embolized by IR. Transfer to ICU Status post 4 units PRBC, 2 units FFP, 10 mg vit k, adneexa, .  On neo, vaso, protonix drip 9/8: BP better, Pressors weaning dowm  Action/Plan: Iv pressors still remains, hgb dropped to 7.8 one unit of bld given.   Expected Discharge Date:  08/22/18               Expected Discharge Plan:  Skilled Nursing Facility  In-House Referral:  Clinical Social Work  Discharge planning Services  CM Consult  Post Acute Care Choice:  NA Choice offered to:  NA  DME Arranged:  N/A DME Agency:  NA  HH Arranged:  NA HH Agency:  NA  Status of Service:  Completed, signed off  If discussed at [employer] of Stay Meetings, dates discussed:    Additional Comments:  Leeroy Cha, RN 09/07/2018, 11:15 AM

## 2018-09-07 NOTE — Progress Notes (Signed)
[place] Physician Progress Note and Electrolyte Replacement  Patient Name: RUFFIN LADA DOB: [birth year] MRN: [id number]  Date of Service  09/07/2018   HPI/Events of Note   Recent Labs  Lab 09/01/18 0458 09/03/18 0450 09/03/18 1253 09/05/18 0249 09/06/18 0333 09/07/18 0500  NA 140 139 142 142 148* 142  K 4.1 4.1 3.8 4.0 3.9 2.7*  CL 104 98 100 96* 105 110  CO2 31 33* 35* 36* 34* 28  GLUCOSE 107* 116* 166* 186* 169* 172*  BUN 11 21 25* 33* 32* 19  CREATININE 0.71 0.97 1.05 1.28* 0.87 0.58*  CALCIUM 8.1* 8.1* 8.1* 7.4* 7.8* 6.6*  MG 1.7  --  1.5* 1.6* 1.7 1.5*  PHOS  --   --   --  3.3 3.2 2.5    Estimated Creatinine Clearance: 111.9 mL/min (A) (by C-G formula based on SCr of 0.58 mg/dL (L)).  Intake/Output      09/08 0701 - 09/09 0700 09/09 0701 - 09/10 0700   P.O.     I.V. (mL/kg) 1914.7 (13.3)    Blood 750    NG/GT     Total Intake(mL/kg) 2664.7 (18.6)    Urine (mL/kg/hr) 1550 (0.5)    Emesis/NG output 650    Stool 0    Total Output 2200    Net +464.7         Stool Occurrence 1 x     - I/O DETAILED x 24h    No intake/output data recorded. - I/O THIS SHIFT    ASSESSMENT Low k Low mag  eICURN Interventions  repelte both   ASSESSMENT: MAJOR ELECTROLYTE      Dr. Brand Males, M.D., [facility].C.P Pulmonary and Critical Care Medicine Staff Physician [place] [facility] and Critical Care Pager: [phone], If no answer or between  15:00h - 7:00h: call [phone]  09/07/2018 11:50 AM

## 2018-09-08 ENCOUNTER — Inpatient Hospital Stay (HOSPITAL_COMMUNITY): Payer: PPO

## 2018-09-08 ENCOUNTER — Encounter (HOSPITAL_COMMUNITY): Payer: Self-pay | Admitting: *Deleted

## 2018-09-08 ENCOUNTER — Encounter (HOSPITAL_COMMUNITY)
Admission: EM | Disposition: A | Discharge status: Skilled Nursing Facility | Payer: Self-pay | Source: Home / Self Care | Attending: Internal Medicine

## 2018-09-08 HISTORY — PX: ESOPHAGOGASTRODUODENOSCOPY (EGD) WITH PROPOFOL: SHX5813

## 2018-09-08 LAB — TYPE AND SCREEN
ABO/RH(D): O POS
ABO/RH(D): O POS
Antibody Screen: NEGATIVE
Antibody Screen: NEGATIVE
Unit division: 0
Unit division: 0
Unit division: 0
Unit division: 0
Unit division: 0
Unit division: 0

## 2018-09-08 LAB — BASIC METABOLIC PANEL
ANION GAP: 4 — AB (ref 5–15)
BUN: 20 mg/dL (ref 8–23)
CALCIUM: 8.5 mg/dL — AB (ref 8.9–10.3)
CO2: 35 mmol/L — ABNORMAL HIGH (ref 22–32)
CREATININE: 0.65 mg/dL (ref 0.61–1.24)
Chloride: 108 mmol/L (ref 98–111)
GFR calc Af Amer: >60 mL/min (ref >60)
GLUCOSE: 85 mg/dL (ref 70–99)
Potassium: 3.9 mmol/L (ref 3.5–5.1)
Sodium: 147 mmol/L — ABNORMAL HIGH (ref 135–145)

## 2018-09-08 LAB — BPAM RBC
Blood Product Expiration Date: 201910062359
Blood Product Expiration Date: 201910062359
Blood Product Expiration Date: 201910062359
Blood Product Expiration Date: 201910062359
Blood Product Expiration Date: 201910062359
Blood Product Expiration Date: 201910062359
ISSUE DATE / TIME: 201909070235
ISSUE DATE / TIME: 201909070235
ISSUE DATE / TIME: 201909070253
ISSUE DATE / TIME: 201909070525
ISSUE DATE / TIME: 201909081057
ISSUE DATE / TIME: 201909081434
Unit Type and Rh: 5100
Unit Type and Rh: 5100
Unit Type and Rh: 5100
Unit Type and Rh: 5100
Unit Type and Rh: 5100
Unit Type and Rh: 5100

## 2018-09-08 LAB — GLUCOSE, CAPILLARY
GLUCOSE-CAPILLARY: 66 mg/dL — AB (ref 70–99)
GLUCOSE-CAPILLARY: 113 mg/dL — AB (ref 70–99)
GLUCOSE-CAPILLARY: 124 mg/dL — AB (ref 70–99)
Glucose-Capillary: 101 mg/dL — ABNORMAL HIGH (ref 70–99)
Glucose-Capillary: 110 mg/dL — ABNORMAL HIGH (ref 70–99)
Glucose-Capillary: 105 mg/dL — ABNORMAL HIGH (ref 70–99)

## 2018-09-08 LAB — CBC
HEMATOCRIT: 30.5 % — AB (ref 39.0–52.0)
HEMOGLOBIN: 9.3 g/dL — AB (ref 13.0–17.0)
MCH: 29.2 pg (ref 26.0–34.0)
MCHC: 30.5 g/dL (ref 30.0–36.0)
MCV: 95.9 fL (ref 78.0–100.0)
Platelets: 131 10*3/uL — ABNORMAL LOW (ref 150–400)
RBC: 3.18 MIL/uL — AB (ref 4.22–5.81)
RDW: 15.7 % — ABNORMAL HIGH (ref 11.5–15.5)
WBC: 13.3 10*3/uL — ABNORMAL HIGH (ref 4.0–10.5)

## 2018-09-08 LAB — TROPONIN I: TROPONIN I: 0.26 ng/mL — AB (ref <0.03)

## 2018-09-08 LAB — LIPASE, BLOOD: LIPASE: 18 U/L (ref 11–51)

## 2018-09-08 LAB — MAGNESIUM: Magnesium: 2.2 mg/dL (ref 1.7–2.4)

## 2018-09-08 LAB — PHOSPHORUS: Phosphorus: 3.3 mg/dL (ref 2.5–4.6)

## 2018-09-08 LAB — LACTIC ACID, PLASMA: LACTIC ACID, VENOUS: 0.5 mmol/L (ref 0.5–1.9)

## 2018-09-08 SURGERY — ESOPHAGOGASTRODUODENOSCOPY (EGD) WITH PROPOFOL
Anesthesia: Moderate Sedation

## 2018-09-08 MED ORDER — MIDAZOLAM HCL 10 MG/2ML IJ SOLN
INTRAMUSCULAR | Status: DC | PRN
  Administered 2018-09-08: 2 mg via INTRAVENOUS

## 2018-09-08 MED ORDER — SODIUM CHLORIDE 0.9 % IV SOLN
8.0000 mg/h | INTRAVENOUS | Status: DC
  Administered 2018-09-08 – 2018-09-09 (×5): 8 mg/h via INTRAVENOUS
  Filled 2018-09-08 (×2): qty 80
  Filled 2018-09-08: qty 40
  Filled 2018-09-08 (×4): qty 80

## 2018-09-08 MED ORDER — PROPOFOL 10 MG/ML IV BOLUS
INTRAVENOUS | Status: AC
  Filled 2018-09-08: qty 40

## 2018-09-08 MED ORDER — MIDAZOLAM HCL 5 MG/ML IJ SOLN
INTRAMUSCULAR | Status: AC
  Filled 2018-09-08: qty 2

## 2018-09-08 MED ORDER — FENTANYL CITRATE (PF) 100 MCG/2ML IJ SOLN
INTRAMUSCULAR | Status: DC | PRN
  Administered 2018-09-08: 25 ug via INTRAVENOUS

## 2018-09-08 MED ORDER — PROPOFOL 10 MG/ML IV BOLUS
INTRAVENOUS | Status: AC
  Filled 2018-09-08: qty 60

## 2018-09-08 MED ORDER — SODIUM CHLORIDE 0.9 % IV SOLN: INTRAVENOUS | Status: DC

## 2018-09-08 MED ORDER — FENTANYL CITRATE (PF) 100 MCG/2ML IJ SOLN
INTRAMUSCULAR | Status: AC
  Filled 2018-09-08: qty 2

## 2018-09-08 SURGICAL SUPPLY — 14 items

## 2018-09-08 NOTE — Progress Notes (Signed)
Spoke with Dr. Oletta Darter in regards to NGT placement. KUB had not yet been read by radiologist. After reviewing image, Dr. Oletta Darter believed NGT to not be in the stomach. Per MD, RN to remove NGT. Radiologist called to report NGT to be in incorrect location, NGT removed. Will continue to monitor.

## 2018-09-08 NOTE — Progress Notes (Signed)
PT Cancellation Note  Patient Details Name: [name] MRN: [id number] DOB: 1941-01-08   Cancelled Treatment:    Reason Eval/Treat Not Completed: Patient at procedure or test/unavailable   Marcelino Freestone PT [phone]  09/08/2018, 8:45 AM

## 2018-09-08 NOTE — Op Note (Signed)
[facility] Patient Name: [name] Procedure Date: 09/08/2018 MRN: [id number] Attending MD: Clarene Essex , MD Date of Birth: [birth year] CSN: [id number] Age: 77 Admit Type: Inpatient Procedure:                Upper GI endoscopy Indications:              Hematochezia, history of ulcer and diverticuli and                            IR with coils Providers:                Clarene Essex, MD, Cleda Daub, RN, William Dalton,                            Technician Referring MD:              Medicines:                Fentanyl 25 micrograms IV, Midazolam 2 mg IV Complications:            No immediate complications. Estimated Blood Loss:     Estimated blood loss: none. Procedure:                Pre-Anesthesia Assessment:                           - Prior to the procedure, a History and Physical                            was performed, and patient medications and                            allergies were reviewed. The patient's tolerance of                            previous anesthesia was also reviewed. The risks                            and benefits of the procedure and the sedation                            options and risks were discussed with the patient.                            All questions were answered, and informed consent                            was obtained. Prior Anticoagulants: The patient has                            taken Eliquis (apixaban), last dose was 5 days                            prior to procedure. ASA Grade Assessment: III - A  patient with severe systemic disease. After                            reviewing the risks and benefits, the patient was                            deemed in satisfactory condition to undergo the                            procedure.                           After obtaining informed consent, the endoscope was                            passed under direct vision. Throughout the                             procedure, the patient's blood pressure, pulse, and                            oxygen saturations were monitored continuously. The                            GIF-H190 ([id number]) Olympus adult endoscope was                            introduced through the mouth, and advanced to the                            third part of duodenum. The upper GI endoscopy was                            accomplished without difficulty. The patient                            tolerated the procedure well. Scope In: Scope Out: Findings:      The larynx was normal.      A small hiatal hernia was present. There was minimal NG tube trauma in       the distal esophagus and possibly some short segment Barrett's not       biopsied      The entire examined stomach was normal. There seemed to be one small NG       tube mark on the lesser curve and the fundus had some leftover spray       residue but no obvious diverticulum was seen and there was no signs of       bleeding in the stomach      Two non-bleeding cratered duodenal ulcers one large and one small and       the large one with pigmented flat black spot which almost looked like a       ulcer inside the ulcer. Both were found in the duodenal bulb. There was       also some erythema and edema      The second portion of the  duodenum and third portion of the duodenum       were normal.      The exam was otherwise without abnormality. Impression:               - Normal larynx.                           - Small hiatal hernia.                           - Normal stomach.                           - Multiple non-bleeding duodenal ulcers with                            pigmented material.                           - Normal second portion of the duodenum and third                            portion of the duodenum.                           - The examination was otherwise normal.                           - No specimens collected. Moderate  Sedation:      Moderate (conscious) sedation was administered by the endoscopy nurse       and supervised by the endoscopist. The following parameters were       monitored: oxygen saturation, heart rate, blood pressure, respiratory       rate, EKG, adequacy of pulmonary ventilation, and response to care. Recommendation:           - Clear liquid diet for 2 days. Would begin with                            sips of clear liquids if okay with ICU team and                            swallowing team                           - Continue present medications. Consider adding                            Carafate if he can swallow medicines                           - Return to GI clinic PRN. Will check on tomorrow                            and okay to leave NG tube out for now                           -  Telephone GI clinic if symptomatic PRN. Procedure Code(s):        --- Professional ---                           [phone], Esophagogastroduodenoscopy, flexible,                            transoral; diagnostic, including collection of                            specimen(s) by brushing or washing, when performed                            (separate procedure) Diagnosis Code(s):        --- Professional ---                           K44.9, Diaphragmatic hernia without obstruction or                            gangrene                           K26.9, Duodenal ulcer, unspecified as acute or                            chronic, without hemorrhage or perforation                           K92.1, Melena (includes Hematochezia) CPT copyright 2017 [employer] Association. All rights reserved. The codes documented in this report are preliminary and upon coder review may  be revised to meet current compliance requirements. Clarene Essex, MD 09/08/2018 9:09:34 AM This report has been signed electronically. Number of Addenda: 0

## 2018-09-08 NOTE — Progress Notes (Signed)
CRITICAL VALUE ALERT  Critical Value:  Troponin 0.26  Date & Time Notied:  09/08/18 9242  Provider Notified: Dr. Oletta Darter  Orders Received/Actions taken: MD made aware

## 2018-09-08 NOTE — Progress Notes (Signed)
[facility] Progress Note Patient Name: [name] DOB: 11-May-1941 MRN: [id number]   Date of Service  09/08/2018  HPI/Events of Note  Continues to have bloody NGT drainage. Plan for EGD in AM.  eICU Interventions  Will reorder Protonix IV infusion.      Intervention Category Major Interventions: Other:  Ava Tangney Cornelia Copa 09/08/2018, 4:18 AM

## 2018-09-08 NOTE — Progress Notes (Signed)
Elink made aware of RN inability to place NGT at this time regardless of attempts x2. Will continue to monitor.

## 2018-09-08 NOTE — Progress Notes (Signed)
Nutrition Follow-up  DOCUMENTATION CODES:   Non-severe (moderate) malnutrition in context of chronic illness, Obesity unspecified  INTERVENTION:  - Diet advancement as medically feasible. - RD will reorder oral nutrition supplements once diet able to be advanced.    NUTRITION DIAGNOSIS:   Moderate Malnutrition related to chronic illness(chronic osteomyelitis) as evidenced by mild fat depletion, moderate muscle depletion. -ongoing  GOAL:   Patient will meet greater than or equal to 90% of their needs -unmet/unable to meet for several days.   MONITOR:   Diet advancement, Weight trends, Labs, Skin, I & O's  ASSESSMENT:   77 yo male who was admitted due to melena and hypotension, hypovolemic shock d/t acute blood loss anemia complicated by AKI. He has PMH significant for He chronic osteomyelitis, CHF, type 2 DM, GERD, hiatal hernia, and HTN.  He was currently treated for C. difficile colitis, and noticed at home melanotic stools. Recent hospitalization for sepsis and cardiac arrest 07/31/18.  Weight has been fluctuating throughout hospitalization (admitted on 8/21); will continue to monitor. Patient seen by this RD on three previous occassions this admission with last visit being on 9/4. At that time, patient was eating 50-100% of meals and from 9/4-9/7 he was consuming mainly 75-100% of meals.   Early AM on 9/7 he began vomiting a large amount of frank, red blood and was subsequently transferred to SDU and NGT placed at that time. NGT was removed earlier this AM. Patient went for upper EGD this AM; pending results. Dr. Golden Pop note this AM states possible IVC filter placement when patient is stable and that patient remains C. Diff positive.    Medications reviewed; sliding scale novolog, 4 g IV Mg sulfate x1 run yesterday, 8 mg/hr IV Protonix, 10 mEq IV KCl x6 runs yesterday.  Labs reviewed; CBGs: 101 and 110 mg/dL today, Na: 147 mmol/L, Ca: 8.5 mg/dL.  IVF; NS @ 100 mL/hr.   Diet  Order:   Diet Order            Diet NPO time specified  Diet effective now        Diet - low sodium heart healthy              EDUCATION NEEDS:   No education needs have been identified at this time  Skin:  Skin Assessment: Skin Integrity Issues: Skin Integrity Issues:: Unstageable Stage II: L heel, L thigh, coccyx, sacrum Unstageable: full thickness to R toe  Last BM:  9/9  Height:   Ht Readings from Last 1 Encounters:  08/19/18 6' (1.829 m)    Weight:   Wt Readings from Last 1 Encounters:  09/08/18 (!) 144.5 kg    Ideal Body Weight:  80.91 kg  BMI:  Body mass index is 43.21 kg/m.  Estimated Nutritional Needs:   Kcal:  2536-6440 (15-17 kcal/kg)  Protein:  120-130 grams  Fluid:  >/= 2 L/day     Jarome Matin, MS, RD, LDN, [facility] Inpatient Clinical Dietitian Pager # [phone] After hours/weekend pager # [phone]

## 2018-09-08 NOTE — Consult Note (Signed)
   [facility] CM Inpatient Consult   09/08/2018  EDISON NICHOLSON 07-May-1941 [id number]   Writer following for [facility] Care Management services post hospital discharge. Chart reviewed. Noted Mr. Mccreedy remains in stepdown unit at [facility]. Also reviewed palliative consult notes. Will continue to follow along for progression and disposition plans. Will make appropriate [facility] referrals upon hospital discharge.  Marthenia Rolling, MSN-Ed, RN,BSN [facility] Liaison [phone]

## 2018-09-08 NOTE — Progress Notes (Signed)
[name]  QQV:[id number] DOB: September 17, 1941 DOA: 08/19/2018 PCP: [place] Bing, DO    LOS: 20 days   Reason for Consult / Chief Complaint:  GI bleed  Consulting MD and date:  [name] MD- 8/28  HPI/Summary of hospital stay:  77 year old nursing home resident with recent admission for septic shock, UTI.pneumonia/soft tissue infection from 7/30-8/9. Hospital course significant for heart failure, PEA arrest for 2 mins on 8/2, Developed C. difficile colitis in nursing home after discharge.     8/21- Admitted to ICU on 8/21 with black tarry stool, shock. GI consulted but patient declined scope. C DIFF PCR - POSITIVE. START PO VANC  GI 8/22- EGD 8/30> s/p epi, hemospray.  8/23- Transferred from ICU to [facility] 8/27- Developed dyspnea, tachypnea, large black stool. Palliative care 8/27- Made limited code 8/28- Transfer back to ICU for ongoing bleed, hypotension. PCCM re consulted.  PPI infusion started GI consulted again.  8/29: EGD- Large duodenal ulcers which were treated with epinephrine and hemo spray to achieve hemostasis. STOP PO VANC   -  - EGD completed on 8/29: Large nonbleeding diverticulum found in the gastric fundus, no evidence of active bleeding.  Diffuse mild inflammation in the gastric body and gastric antrum.  Biopsies were taken.  2 nonobstructing oozing cratered duodenal ulcers with visible vessel found at the duodenal bulb.  Biopsies were taken.  Hemostasis was achieved with epinephrine and subsequent hemo-spray.  8/29 stomach particular biopsy>>> chronic gastritis 8/29 duodenal ulcer biopsies x2>>> focal acute inflammation. 9/4: Acute PE, started anticoagulation with heparin, transition to eliquis 9/6 9/7: Massive GIB, transfused, anticoagulation reversed. Embolized by IR. Transfer to ICU Status post 4 units PRBC, 2 units FFP, 10 mg vit k, adneexa, .  On neo, vaso, protonix drip 9/8: BP better, Pressors weaning dowm. More awake today.  Does not remember events of  the past 48 hours Denies any dyspnea, abdominal pain.   09/07/18 - off neo. Needing stress dose vaso. Not delirious. Not on vent. Having 400cc red Leete blood via NG overnight and additional 100cc since 7am. New black stools now + . Aniticoagulation on hold. DUplex LE - NO DVT   Subjective:    09/08/18 - Endoscopy today -> Impression:               - Normal larynx.                           - Small hiatal hernia.                           - Normal stomach.                           - Multiple non-bleeding duodenal ulcers with                            pigmented material.                           - Normal second portion of the duodenum and third                            portion of the duodenum.                           -  The examination was otherwise normal.                           - No specimens collected.  Off pressors Not on vent Improved hgb  Objective   Blood pressure 136/72, pulse 95, temperature 98.1 F (36.7 C), temperature source Oral, resp. rate 18, height 6' (1.829 m), weight (!) 144.5 kg, SpO2 97 %.        Intake/Output Summary (Last 24 hours) at 09/08/2018 1022 Last data filed at 09/08/2018 0945 Gross per 24 hour  Intake 2433.8 ml  Output 575 ml  Net 1858.8 ml   Filed Weights   09/06/18 0440 09/07/18 0517 09/08/18 0400  Weight: (!) 141.7 kg (!) 143.5 kg (!) 144.5 kg    Examination: General Appearance:  Obese, Recovering from sedation Head:  Normocephalic, without obvious abnormality, atraumatic Eyes:  PERRL - yes, conjunctiva/corneas - clear     Ears:  Normal external ear canals, both ears Nose:  G tube - removed (GI does not want it - due to trauma risk) Throat:  ETT TUBE - no , OG tube - no Neck:  Supple,  No enlargement/tenderness/nodules Lungs: Clear to auscultation bilaterally,  Heart:  S1 and S2 normal, no murmur, CVP - no.  Pressors - off x 1h Abdomen:  Soft, no masses, no organomegaly. Obese. Not tender Genitalia / Rectal:  Not  done Extremities:  Extremities- intac . Has compression stocking Skin:  ntact in exposed areas . Sacral area - not exampined Neurologic:  Sedation - post moderate sedation x 1h for endoscopy -> RASS - -2 . Moves all 4s - yes. CAM-ICU - unable to assess        Labs    PULMONARY Recent Labs  Lab 09/05/18 0328  PHART 7.338*  PCO2ART 66.0*  PO2ART 104  HCO3 34.4*  O2SAT 98.1    CBC Recent Labs  Lab 09/07/18 0800 09/07/18 1341 09/08/18 0535  HGB 7.8* 8.4* 9.3*  HCT 24.5* 27.2* 30.5*  WBC 10.7* 11.6* 13.3*  PLT 93* 108* 131*    COAGULATION Recent Labs  Lab 09/02/18 1405 09/05/18 0249 09/05/18 0904  INR 1.03 1.38 1.13    CARDIAC   Recent Labs  Lab 09/05/18 0249 09/08/18 0535  TROPONINI <0.03 0.26*   No results for input(s): PROBNP in the last 168 hours.   CHEMISTRY Recent Labs  Lab 09/03/18 1253 09/05/18 0249 09/06/18 0333 09/07/18 0500 09/08/18 0100 09/08/18 0535  NA 142 142 148* 142 147*  --   K 3.8 4.0 3.9 2.7* 3.9  --   CL 100 96* 105 110 108  --   CO2 35* 36* 34* 28 35*  --   GLUCOSE 166* 186* 169* 172* 85  --   BUN 25* 33* 32* 19 20  --   CREATININE 1.05 1.28* 0.87 0.58* 0.65  --   CALCIUM 8.1* 7.4* 7.8* 6.6* 8.5*  --   MG 1.5* 1.6* 1.7 1.5*  --  2.2  PHOS  --  3.3 3.2 2.5  --  3.3   Estimated Creatinine Clearance: 112.4 mL/min (by C-G formula based on SCr of 0.65 mg/dL).   LIVER Recent Labs  Lab 09/02/18 1405 09/05/18 0249 09/05/18 0904  INR 1.03 1.38 1.13     INFECTIOUS Recent Labs  Lab 09/01/18 1128 09/02/18 0337 09/03/18 0450 09/08/18 0535  LATICACIDVEN  --   --   --  0.5  PROCALCITON <0.10 <0.10 <0.10  --  ENDOCRINE CBG (last 3)  Recent Labs    09/07/18 2334 09/08/18 0345 09/08/18 0753  GLUCAP 88 101* 110*         IMAGING x48h  - image(s) personally visualized  -   highlighted in bold Dg Abd 1 View  Result Date: 09/08/2018 CLINICAL DATA:  NG placement EXAM: ABDOMEN - 1 VIEW COMPARISON:   09/08/2018 FINDINGS: NG has been removed from the right lower lobe and now is in the stomach. NG tip in the gastric fundus. Bibasilar atelectasis/infiltrate.  Small left effusion. Normal bowel gas pattern. Embolization coils in the region of the right gastroduodenal artery. IMPRESSION: NG tube in the gastric fundus.  Normal bowel gas pattern. Electronically Signed   By: Franchot Gallo M.D.   On: 09/08/2018 09:30   Dg Abd 1 View  Result Date: 09/08/2018 CLINICAL DATA:  NG tube placement. EXAM: ABDOMEN - 1 VIEW COMPARISON:  Radiographs yesterday. FINDINGS: An enteric tube courses over the right lower chest with tip in the region of the right lower lobe bronchus in side-port in the region of the right mainstem bronchus. Recommend removal and replacement. Previous gastric tube in the stomach is no longer seen. IMPRESSION: Enteric tube projecting over the right lower chest suspicious for bronchial placement. Recommend removal and replacement. These results will be called to the ordering clinician or representative by the Radiologist Assistant, and communication documented in the PACS or zVision Dashboard. Electronically Signed   By: Keith Rake M.D.   On: 09/08/2018 04:12   Dg Abd 1 View  Result Date: 09/07/2018 CLINICAL DATA:  NG tube placement. EXAM: ABDOMEN - 1 VIEW COMPARISON:  09/05/2018 FINDINGS: Nonobstructive bowel gas pattern. Enteric catheter tip overlies the expected location of the gastric antrum. Embolization coils noted in the right upper quadrant of the abdomen. IMPRESSION: Enteric catheter tip at the expected location of the gastric antrum. Electronically Signed   By: Fidela Salisbury M.D.   On: 09/07/2018 09:30       Assessment & Plan:  77 year old with recurrent GI bleed, duodenal ulcer  Acute GI bleed with acute blood loss anemia in the setting of duodenal ulcers with visible blood vessels identified via EGD on 8/29, and treated with hemo-spray. S/p embolization again for  recurrent massive bleed 09/05/18 .   09/08/2018 - no bleed on repeat endoscopy. GI recommend no NG tube . Hgb better    Plan: Continue PPI drip - timing to dc based on GI input Daily CBC check  Cardiomyopathy. EF 30-35%, status post recent cardiac arrest HTN  - 09/08/2018 - maintains BP/HR - off pressors  Plan  Holding aspirin, Lopressor, Cozaar - restart based on clinical course  Segmental pulmonary embolism this admit - 09/02/18. Duplex LE negative 09/07/18 Hold all anticoagulation.  plan IVC filter possibly when stable - triad to decide based n course. Likely he needs it given obesity, immobility and poor LE venous circulation but runs risk for chronic DVT   H/O C. difficile colitis - C. difficile test shows positive antigen, negative toxin.  He is likely colonized at this point. S/p po vanc  Plan Monitor off oral vancomycin.   Diabetes  Plan Continue SSI  History of prostate adenocarcinoma Plan Follow-up with [facility] as outpatient  LE venous ulcers Plan  Continue routine wound care, Unna boots   Profound debilitation and weakness/deconditioning Plan PT when stable  Goals of care Plan Palliative care on board.  He is limited code.  Continue discussions.  Disposition / Summary of Today's  Plan 09/08/18   Tx to SDU and if well by PM to med surg. TRH to assume primary from 09/09/18 - d/w Hospitalist    Best Practice / Goals of Care / Disposition.   DVT prophylaxis: SCDs, Unna boots GI prophylaxis: PPI drip Diet: NPO Mobility: Bedrest Code Status: Limited code, no CPR or intubation. Family Communication: No Family at bedside 9/8. No family at bedside 09/08/2018     ATTESTATION & SIGNATURE    Dr. Brand Males, M.D., [facility].C.P Pulmonary and Critical Care Medicine Staff Physician [facility] Pager: [phone], If no answer or between  15:00h - 7:00h: call [phone]  09/08/2018 10:22  AM

## 2018-09-08 NOTE — Progress Notes (Signed)
[name] 8:45 AM  Subjective: Patient seen and examined and hospital computer chart reviewedand there is currently no blood in his NG tube and the patient denies any pain and the case discussed with his nurse as well and supposedly he got 4 units of blood yesterday and some FFP and his canister before it was changed was filled with maroon blood and he denies any shortness of breath  Objective: ital signs stable afebrile exam please see preassessment evalution hemoglobin okay today no new BUN  Assessment: Upper GI bleeding questionable ulcer versus fundic diverticulum based on previous endoscopy  Plan: Okay to proceed with endoscopy this morning at the bedside with further workup and plans please see that dictation  [facility] E  Pager [phone] After 5PM or if no answer call [phone]

## 2018-09-08 NOTE — Care Management (Signed)
  Discharge Readiness Milestones  MCG Used: upper gi bleed Optimal GLOS: extended stay Hospital Day: 20 Extended Stay Return to top of Gastritis and Duodenitis RRG - ISC Minimal (a few hours to 1 day), Brief (1 to 3 days), Moderate (4 to 7 days), and Prolonged (more than 7 days). [Expand All / Collapse All]  Extended stay beyond goal length of stay may be needed for: ? Hemorrhagic gastritis or duodenitis  See Gastrointestinal Bleeding, UpperISC guideline as appropriate.  Anticipate possible blood transfusion, CBC monitoring, and possible surgery.  Expect minimal to brief stay extension. ? Surgical intervention  Surgical intervention may be required for continued or recurrent bleeding despite medical or endoscopic therapy.  Expect brief to moderate stay extension. ? Gastroparesis  Patient with concomitant gastroparesis may require more inpatient procedures and longer acute hospital care.  Expect brief stay extension. ? Older patient  Patient 106 years or older may require longer acute hospital care.  Expect brief stay extension. ?  Other complication or condition having egd today -[id number] for continued bleeding  Discharge Readiness Milestones  Identified Barriers: Date/Name case discussed with attending MD (if applicable):  Date/name case discussed with supervisor (if applicable):  Date/name case referred to physician advisor (if applicable)   Concurrent Review - Discharge Readiness Milestones, Not Met

## 2018-09-09 LAB — CBC WITH DIFFERENTIAL/PLATELET
BASOS ABS: 0 10*3/uL (ref 0.0–0.1)
BASOS PCT: 0 %
EOS PCT: 5 %
Eosinophils Absolute: 0.5 10*3/uL (ref 0.0–0.7)
HCT: 29.1 % — ABNORMAL LOW (ref 39.0–52.0)
Hemoglobin: 8.7 g/dL — ABNORMAL LOW (ref 13.0–17.0)
LYMPHS PCT: 11 %
Lymphs Abs: 1.1 10*3/uL (ref 0.7–4.0)
MCH: 29.1 pg (ref 26.0–34.0)
MCHC: 29.9 g/dL — ABNORMAL LOW (ref 30.0–36.0)
MCV: 97.3 fL (ref 78.0–100.0)
MONO ABS: 0.8 10*3/uL (ref 0.1–1.0)
Monocytes Relative: 7 %
Neutro Abs: 8.3 10*3/uL — ABNORMAL HIGH (ref 1.7–7.7)
Neutrophils Relative %: 77 %
PLATELETS: 138 10*3/uL — AB (ref 150–400)
RBC: 2.99 MIL/uL — ABNORMAL LOW (ref 4.22–5.81)
RDW: 15.5 % (ref 11.5–15.5)
WBC: 10.8 10*3/uL — ABNORMAL HIGH (ref 4.0–10.5)

## 2018-09-09 LAB — GLUCOSE, CAPILLARY
GLUCOSE-CAPILLARY: 107 mg/dL — AB (ref 70–99)
GLUCOSE-CAPILLARY: 136 mg/dL — AB (ref 70–99)
GLUCOSE-CAPILLARY: 97 mg/dL (ref 70–99)
Glucose-Capillary: 107 mg/dL — ABNORMAL HIGH (ref 70–99)
Glucose-Capillary: 79 mg/dL (ref 70–99)
Glucose-Capillary: 86 mg/dL (ref 70–99)
Glucose-Capillary: 90 mg/dL (ref 70–99)

## 2018-09-09 LAB — BASIC METABOLIC PANEL
Anion gap: 4 — ABNORMAL LOW (ref 5–15)
BUN: 12 mg/dL (ref 8–23)
CALCIUM: 7.4 mg/dL — AB (ref 8.9–10.3)
CHLORIDE: 114 mmol/L — AB (ref 98–111)
CO2: 29 mmol/L (ref 22–32)
CREATININE: 0.53 mg/dL — AB (ref 0.61–1.24)
GFR calc non Af Amer: >60 mL/min (ref >60)
Glucose, Bld: 109 mg/dL — ABNORMAL HIGH (ref 70–99)
Potassium: 3.2 mmol/L — ABNORMAL LOW (ref 3.5–5.1)
SODIUM: 147 mmol/L — AB (ref 135–145)

## 2018-09-09 LAB — PHOSPHORUS: PHOSPHORUS: 2.5 mg/dL (ref 2.5–4.6)

## 2018-09-09 LAB — MAGNESIUM: MAGNESIUM: 1.7 mg/dL (ref 1.7–2.4)

## 2018-09-09 MED ORDER — SUCRALFATE 1 G PO TABS
1.0000 g | ORAL_TABLET | Freq: Three times a day (TID) | ORAL | Status: DC
  Administered 2018-09-09 – 2018-10-01 (×76): 1 g via ORAL
  Filled 2018-09-09 (×81): qty 1

## 2018-09-09 MED ORDER — POTASSIUM CHLORIDE CRYS ER 20 MEQ PO TBCR
40.0000 meq | EXTENDED_RELEASE_TABLET | ORAL | Status: AC
  Administered 2018-09-09 (×2): 40 meq via ORAL
  Filled 2018-09-09 (×2): qty 2

## 2018-09-09 NOTE — Progress Notes (Signed)
Pt continues to decline use of CPAP QHS, machine remains at bedside.  RT to monitor and assess as needed.

## 2018-09-09 NOTE — Progress Notes (Signed)
Patient transferred from ICU, alert and oriented with periods of confusion. BLE wound unable to assess-unna boots inplace. Sacral wound-form dressing applied-no sign of infection noted. See flowsheet for other wounds documented.

## 2018-09-09 NOTE — Progress Notes (Signed)
[name] 3:35 PM  Subjective: Patient doing well without signs of bleeding and tolerating clear liquids and no new complaints he denies diarrhea  Objective: Vital signs stable afebrile no acute distress abdomen is soft nontender hemoglobin okay BUN okay  Assessment: Peptic ulcer disease  Plan: Will add Carafate and 1 or 2 more days of IV Protonix then can switch to by mouth and one or 2 more days of clear liquids and if okay may slowly advance diet  [facility] E  Pager [phone] After 5PM or if no answer call [phone]

## 2018-09-09 NOTE — Progress Notes (Signed)
Pt. seen for CPAP order- refused remains on 2 lpm n/c, NG tube has since been removed yesterday`s date, had been refusing days before admission to ICU/SD.

## 2018-09-09 NOTE — Progress Notes (Signed)
Pt. agreed to be placed on CPAP after desaturating while on 2 lpm n/c-falling asleep, RN aware at bedside.

## 2018-09-09 NOTE — Progress Notes (Addendum)
PROGRESS NOTE [facility]   BERTHOLD GLACE   AST:[id number] DOB: 1941-04-01  DOA: 08/19/2018 PCP: Wake Bing, DO   Brief Narrative:  LELEND HEINECKE 77 y/o M with PMHx of 77 year old male with past medical history significant for chronic systolic and diastolic heart failure, hyperlipidemia, BPH and recently treated for C. difficile colitis presented to the emergency department on 8/21 complaining of black tarry stools and hypotension.  Patient was subsequently found to be hypoxic, anemic with hemoglobin of 6.7.  Patient was transfused and scheduled for outpatient GI work-up however on 8/24 patient declined euvolemic shock and was transferred to the ICU on 08/27/2018, patient underwent EGD which showed large duodenal bulb ulcer with visible vessel and was treated with epi injection and gold probe cautery.  Patient remained hypoxic, 9/4 CTA of the chest revealed Acute PE.  Patient was started on heparin drip, case was discussed with GI who recommended 48 hours of heparin and if no bleeding switch to oral anticoagulation. eliquis started on 9/6. On 9/7 patient developed hematemesis, became hypotensive and was transferred to the ICU for pressors, found to have massive GI bleed.  Anticoagulation was reversed, he was transfused 4 more units of PRBC's and was embolized by IR. 9/9 pressors were d/c and patient remained stable. Patient transferred to [facility] for further management   Subjective: Patient seen and examined, his breathing is doing ok, feel very weak. No acute events overnight. No further signs of bleeding. Remains afebrile. Off pressors   Assessment & Plan: Acute respiratory failure with hypoxia due to PE  Chest CTA + for PE. Chest x-ray with no evidence of infectious process however does show some atelectasis.  Continue oxygen supplementation as needed and wean as able. Treat underlying causes.   Massive Upper GI bleed Due to bleeding vessel at the duodenal bulb ulcer. Patient has  been transfused total of 12 units of PRBCs during hospital stay.  Underwent EGD 8/29 and ulcer was treated with epi injection and gold probe cautery, however requiring bleed after started on Eliquis patient underwent embolization by IR.,  EGD was performed at bedside on 9/10 with no signs of further bleeding.  Continue to monitor hemoglobin.  Clear liquid diet.  Continue Carafate and PPI.  GI following   New Acute PE  CTA shows pulmonary emboli in the segmental branches of the left upper lobe.  Started with venous insufficiency.  Doppler ultrasound of the lower extremity was negative for DVT.  Patient was treated with heparin drip, subsequently switched to Eliquis and developed massive GI bleed.  Patient not candidate for anticoagulation.  Will discuss with IR if candidate for IVC filter given patient poor mobility is at high risk for blood clot.  Recent PEA arrest - stable, asymptomatic  In the setting of acute systolic heart failure. EF 30-35%.  No signs of fluid overload at this time.  Medications are on hold due to hypotensive shock from GI bleed.  Will reintroduce medication as BP normalized and patient tolerating oral diet well.  Continue to monitor.  Recently diagnosed with C. Difficile - resolved  S/p oral vancomycin course ended 8/29  BPH - stable  Tamsulosin on hold.  Goals of care  Palliative recommendations appreciated   DVT prophylaxis: Heparin ggt  Code Status: Partial  Family Communication: None at bedside  Disposition Plan: SNF when medically stable.  Consultants:   GI  PCCM  Procedures:   EGD   Antimicrobials: Anti-infectives (From admission, onward)   Start  Dose/Rate Route Frequency Ordered Stop   08/22/18 0000  vancomycin (VANCOCIN) 50 mg/mL oral solution     125 mg Oral 4 times daily 08/22/18 1323     08/19/18 1930  vancomycin (VANCOCIN) 50 mg/mL oral solution 125 mg     125 mg Oral 4 times daily 08/19/18 1812 08/29/18 1759       Objective: Vitals:     09/09/18 0354 09/09/18 0449 09/09/18 0524 09/09/18 0700  BP:   133/73 (!) 146/72  Pulse:   91 88  Resp:   18 18  Temp: (!) 97.5 F (36.4 C)     TempSrc: Oral     SpO2:   94% 100%  Weight:  (!) 146.6 kg    Height:        Intake/Output Summary (Last 24 hours) at 09/09/2018 0905 Last data filed at 09/09/2018 0600 Gross per 24 hour  Intake 2934.01 ml  Output -  Net 2934.01 ml   Filed Weights   09/07/18 0517 09/08/18 0400 09/09/18 0449  Weight: (!) 143.5 kg (!) 144.5 kg (!) 146.6 kg    Examination:  General: Pt is alert, awake, not in acute distress Cardiovascular: RRR, I4/P8 + systolic murmur Respiratory: Breath sounds diminished bilaterally, no wheezing or crackles  Abdominal: Soft, NT, ND, bowel sounds + Extremities: Bilateral E Unna boot in place, dry and intact.  Hyperkeratosis of the toes laterally  Data Reviewed: I have personally reviewed following labs and imaging studies  CBC: Recent Labs  Lab 09/03/18 1253  09/07/18 0500 09/07/18 0800 09/07/18 1341 09/08/18 0535 09/09/18 0351  WBC 9.3   < > 10.2 10.7* 11.6* 13.3* 10.8*  NEUTROABS 6.7  --   --   --  9.8*  --  8.3*  HGB 7.9*   < > 7.8* 7.8* 8.4* 9.3* 8.7*  HCT 25.3*   < > 24.7* 24.5* 27.2* 30.5* 29.1*  MCV 93.7   < > 93.2 92.1 93.8 95.9 97.3  PLT 174   < > 98* 93* 108* 131* 138*   < > = values in this interval not displayed.   Basic Metabolic Panel: Recent Labs  Lab 09/05/18 0249 09/06/18 0333 09/07/18 0500 09/08/18 0100 09/08/18 0535 09/09/18 0351  NA 142 148* 142 147*  --  147*  K 4.0 3.9 2.7* 3.9  --  3.2*  CL 96* 105 110 108  --  114*  CO2 36* 34* 28 35*  --  29  GLUCOSE 186* 169* 172* 85  --  109*  BUN 33* 32* 19 20  --  12  CREATININE 1.28* 0.87 0.58* 0.65  --  0.53*  CALCIUM 7.4* 7.8* 6.6* 8.5*  --  7.4*  MG 1.6* 1.7 1.5*  --  2.2 1.7  PHOS 3.3 3.2 2.5  --  3.3 2.5   GFR: Estimated Creatinine Clearance: 113.2 mL/min (A) (by C-G formula based on SCr of 0.53 mg/dL (L)). Liver Function  Tests: No results for input(s): AST, ALT, ALKPHOS, BILITOT, PROT, ALBUMIN in the last 168 hours. Recent Labs  Lab 09/08/18 0535  LIPASE 18   No results for input(s): AMMONIA in the last 168 hours. Coagulation Profile: Recent Labs  Lab 09/02/18 1405 09/05/18 0249 09/05/18 0904  INR 1.03 1.38 1.13   Cardiac Enzymes: Recent Labs  Lab 09/05/18 0249 09/08/18 0535  TROPONINI <0.03 0.26*   BNP (last 3 results) No results for input(s): PROBNP in the last 8760 hours. HbA1C: No results for input(s): HGBA1C in the last 72 hours. CBG:  Recent Labs  Lab 09/08/18 1651 09/08/18 1757 09/08/18 1957 09/09/18 0315 09/09/18 0806  GLUCAP 66* 113* 124* 79 107*   Lipid Profile: No results for input(s): CHOL, HDL, LDLCALC, TRIG, CHOLHDL, LDLDIRECT in the last 72 hours. Thyroid Function Tests: No results for input(s): TSH, T4TOTAL, FREET4, T3FREE, THYROIDAB in the last 72 hours. Anemia Panel: No results for input(s): VITAMINB12, FOLATE, FERRITIN, TIBC, IRON, RETICCTPCT in the last 72 hours. Sepsis Labs: Recent Labs  Lab 09/03/18 0450 09/08/18 0535  PROCALCITON <0.10  --   LATICACIDVEN  --  0.5    No results found for this or any previous visit (from the past 240 hour(s)).    Radiology Studies: Dg Abd 1 View  Result Date: 09/08/2018 CLINICAL DATA:  NG placement EXAM: ABDOMEN - 1 VIEW COMPARISON:  09/08/2018 FINDINGS: NG has been removed from the right lower lobe and now is in the stomach. NG tip in the gastric fundus. Bibasilar atelectasis/infiltrate.  Small left effusion. Normal bowel gas pattern. Embolization coils in the region of the right gastroduodenal artery. IMPRESSION: NG tube in the gastric fundus.  Normal bowel gas pattern. Electronically Signed   By: Franchot Gallo M.D.   On: 09/08/2018 09:30   Dg Abd 1 View  Result Date: 09/08/2018 CLINICAL DATA:  NG tube placement. EXAM: ABDOMEN - 1 VIEW COMPARISON:  Radiographs yesterday. FINDINGS: An enteric tube courses over the  right lower chest with tip in the region of the right lower lobe bronchus in side-port in the region of the right mainstem bronchus. Recommend removal and replacement. Previous gastric tube in the stomach is no longer seen. IMPRESSION: Enteric tube projecting over the right lower chest suspicious for bronchial placement. Recommend removal and replacement. These results will be called to the ordering clinician or representative by the Radiologist Assistant, and communication documented in the PACS or zVision Dashboard. Electronically Signed   By: Keith Rake M.D.   On: 09/08/2018 04:12   Dg Abd 1 View  Result Date: 09/07/2018 CLINICAL DATA:  NG tube placement. EXAM: ABDOMEN - 1 VIEW COMPARISON:  09/05/2018 FINDINGS: Nonobstructive bowel gas pattern. Enteric catheter tip overlies the expected location of the gastric antrum. Embolization coils noted in the right upper quadrant of the abdomen. IMPRESSION: Enteric catheter tip at the expected location of the gastric antrum. Electronically Signed   By: Fidela Salisbury M.D.   On: 09/07/2018 09:30    Scheduled Meds: . chlorhexidine  15 mL Mouth Rinse BID  . insulin aspart  0-20 Units Subcutaneous Q4H  . latanoprost  1 drop Both Eyes QHS  . mouth rinse  15 mL Mouth Rinse q12n4p  . sodium chloride flush  10-40 mL Intracatheter Q12H   Continuous Infusions: . sodium chloride 100 mL/hr at 09/09/18 0636  . pantoprozole (PROTONIX) infusion 8 mg/hr (09/09/18 0328)     LOS: 21 days    Time spent: Total of 15 minutes spent with pt, greater than 50% of which was spent in discussion of  treatment, counseling and coordination of care   Chipper Oman, MD Pager: Text Page via [email or web address]   If 7PM-7AM, please contact night-coverage [email or web address] 09/09/2018, 9:05 AM   Note - This record has been created using [employer]. Chart creation errors have been sought, but may not always have been located. Such creation errors do not reflect on the  standard of medical care.

## 2018-09-10 ENCOUNTER — Inpatient Hospital Stay (HOSPITAL_COMMUNITY): Payer: PPO

## 2018-09-10 ENCOUNTER — Encounter (HOSPITAL_COMMUNITY): Payer: Self-pay | Admitting: Gastroenterology

## 2018-09-10 DIAGNOSIS — I2609 Other pulmonary embolism with acute cor pulmonale: Secondary | ICD-10-CM

## 2018-09-10 LAB — GLUCOSE, CAPILLARY
GLUCOSE-CAPILLARY: 122 mg/dL — AB (ref 70–99)
GLUCOSE-CAPILLARY: 61 mg/dL — AB (ref 70–99)
GLUCOSE-CAPILLARY: 73 mg/dL (ref 70–99)
Glucose-Capillary: 122 mg/dL — ABNORMAL HIGH (ref 70–99)
Glucose-Capillary: 109 mg/dL — ABNORMAL HIGH (ref 70–99)
Glucose-Capillary: 83 mg/dL (ref 70–99)

## 2018-09-10 LAB — BLOOD GAS, ARTERIAL
ACID-BASE EXCESS: 3.9 mmol/L — AB (ref 0.0–2.0)
Acid-Base Excess: 1.5 mmol/L (ref 0.0–2.0)
BICARBONATE: 31.5 mmol/L — AB (ref 20.0–28.0)
Bicarbonate: 31.2 mmol/L — ABNORMAL HIGH (ref 20.0–28.0)
DELIVERY SYSTEMS: POSITIVE
Drawn by: 257701
Expiratory PAP: 6
FIO2: 40
Inspiratory PAP: 14
O2 Content: 1 L/min
O2 Saturation: 90.7 %
O2 Saturation: 97.6 %
PATIENT TEMPERATURE: 98.6
PATIENT TEMPERATURE: 98.6
PH ART: 7.272 — AB (ref 7.350–7.450)
pCO2 arterial: 89.2 mmHg (ref 32.0–48.0)
pCO2 arterial: 70.6 mmHg (ref 32.0–48.0)
pH, Arterial: 7.17 — CL (ref 7.350–7.450)
pO2, Arterial: 64.5 mmHg — ABNORMAL LOW (ref 83.0–108.0)
pO2, Arterial: 98 mmHg (ref 83.0–108.0)

## 2018-09-10 LAB — BASIC METABOLIC PANEL
Anion gap: 5 (ref 5–15)
BUN: 11 mg/dL (ref 8–23)
CO2: 31 mmol/L (ref 22–32)
CREATININE: 0.66 mg/dL (ref 0.61–1.24)
Calcium: 9 mg/dL (ref 8.9–10.3)
Chloride: 114 mmol/L — ABNORMAL HIGH (ref 98–111)
GFR calc Af Amer: >60 mL/min (ref >60)
GLUCOSE: 142 mg/dL — AB (ref 70–99)
POTASSIUM: 4.5 mmol/L (ref 3.5–5.1)
Sodium: 150 mmol/L — ABNORMAL HIGH (ref 135–145)

## 2018-09-10 LAB — CBC WITH DIFFERENTIAL/PLATELET
Basophils Absolute: 0 10*3/uL (ref 0.0–0.1)
Basophils Relative: 0 %
EOS PCT: 2 %
Eosinophils Absolute: 0.2 10*3/uL (ref 0.0–0.7)
HCT: 31.9 % — ABNORMAL LOW (ref 39.0–52.0)
Hemoglobin: 9.5 g/dL — ABNORMAL LOW (ref 13.0–17.0)
LYMPHS ABS: 0.5 10*3/uL — AB (ref 0.7–4.0)
LYMPHS PCT: 5 %
MCH: 29.5 pg (ref 26.0–34.0)
MCHC: 29.8 g/dL — AB (ref 30.0–36.0)
MCV: 99.1 fL (ref 78.0–100.0)
MONO ABS: 0.6 10*3/uL (ref 0.1–1.0)
MONOS PCT: 6 %
Neutro Abs: 8.8 10*3/uL — ABNORMAL HIGH (ref 1.7–7.7)
Neutrophils Relative %: 87 %
PLATELETS: 169 10*3/uL (ref 150–400)
RBC: 3.22 MIL/uL — ABNORMAL LOW (ref 4.22–5.81)
RDW: 15.5 % (ref 11.5–15.5)
WBC: 10.2 10*3/uL (ref 4.0–10.5)

## 2018-09-10 LAB — PHOSPHORUS: Phosphorus: 3.1 mg/dL (ref 2.5–4.6)

## 2018-09-10 LAB — MAGNESIUM: Magnesium: 1.9 mg/dL (ref 1.7–2.4)

## 2018-09-10 MED ORDER — ADULT MULTIVITAMIN W/MINERALS CH
1.0000 | ORAL_TABLET | Freq: Every day | ORAL | Status: DC
  Administered 2018-09-11 – 2018-09-21 (×10): 1 via ORAL
  Filled 2018-09-10 (×11): qty 1

## 2018-09-10 MED ORDER — ENSURE ENLIVE PO LIQD: 237.0000 mL | Freq: Three times a day (TID) | ORAL | Status: DC

## 2018-09-10 MED ORDER — DEXTROSE 50 % IV SOLN
INTRAVENOUS | Status: AC
  Administered 2018-09-10: 25 mL
  Filled 2018-09-10: qty 50

## 2018-09-10 MED ORDER — PANTOPRAZOLE SODIUM 40 MG PO TBEC
40.0000 mg | DELAYED_RELEASE_TABLET | Freq: Two times a day (BID) | ORAL | Status: DC
  Administered 2018-09-11 – 2018-10-01 (×40): 40 mg via ORAL
  Filled 2018-09-10 (×42): qty 1

## 2018-09-10 MED ORDER — FUROSEMIDE 10 MG/ML IJ SOLN
20.0000 mg | Freq: Every day | INTRAMUSCULAR | Status: DC
  Administered 2018-09-10: 20 mg via INTRAVENOUS
  Filled 2018-09-10: qty 2

## 2018-09-10 MED ORDER — ENSURE ENLIVE PO LIQD
237.0000 mL | Freq: Two times a day (BID) | ORAL | Status: DC
  Administered 2018-09-10 – 2018-09-11 (×3): 237 mL via ORAL

## 2018-09-10 NOTE — Progress Notes (Signed)
During initial assessment, pt sleepy and lethargic, arouse to name and return to sleep. Hold oral intake until pt is awake. Will continue to monitor. SRP, RN

## 2018-09-10 NOTE — Progress Notes (Signed)
PROGRESS NOTE [facility]   [name]   FUX:[id number] DOB: 1941-09-26  DOA: 08/19/2018 PCP: Lithium Bing, DO   Brief Narrative:  [name] 77 y/o M with PMHx of 77 year old male with past medical history significant for chronic systolic and diastolic heart failure, hyperlipidemia, BPH and recently treated for C. difficile colitis presented to the emergency department on 8/21 complaining of black tarry stools and hypotension.  Patient was subsequently found to be hypoxic, anemic with hemoglobin of 6.7.  Patient was transfused and scheduled for outpatient GI work-up however on 8/24 patient declined euvolemic shock and was transferred to the ICU on 08/27/2018, patient underwent EGD which showed large duodenal bulb ulcer with visible vessel and was treated with epi injection and gold probe cautery.  Patient remained hypoxic, 9/4 CTA of the chest revealed Acute PE.  Patient was started on heparin drip, case was discussed with GI who recommended 48 hours of heparin and if no bleeding switch to oral anticoagulation. eliquis started on 9/6. On 9/7 patient developed hematemesis, became hypotensive and was transferred to the ICU for pressors, found to have massive GI bleed.  Anticoagulation was reversed, he was transfused 4 more units of PRBC's and was embolized by IR. 9/9 pressors were d/c and patient remained stable. Patient transferred to [facility] for further management   Subjective: Patient seen and examined, he is lethargic and difficult to arouse. This is new from yesterday. He O2 was on 3.5L. Unable to interview due to lethargy. ABG shows hypercarbia.   Assessment & Plan: Acute respiratory failure with hypoxia due to PE, now hypercarbia  Chest CTA + for PE.  Patient with acute hypoxia.  ABG shows hypoxia and hypercarbia.  Transferred to STU, start BiPAP.  Chest x-ray ordered and shows increasing vascular congestion/edema will start IV Lasix.  Oxygen supplementation to keep saturation  between 89 and 94%.  Massive Upper GI bleed - stable Due to bleeding vessel at the duodenal bulb ulcer. Patient has been transfused total of 12 units of PRBCs during hospital stay.  Underwent EGD 8/29 and ulcer was treated with epi injection and gold probe cautery, however requiring bleed after started on Eliquis patient underwent embolization by IR.,  EGD was performed at bedside on 9/10 with no signs of further bleeding.  Continue to monitor hemoglobin.  Advance diet per GI recommendation.  Continue Carafate and PPI.     Acute PE  CTA shows pulmonary emboli in the segmental branches of the left upper lobe.  Started with venous insufficiency.  Doppler ultrasound of the lower extremity was negative for DVT.  Patient was treated with heparin drip, subsequently switched to Eliquis and developed massive GI bleed.  Patient not candidate for anticoagulation.  Need to discuss with IR if patient is candidate for IVC filter given poor mobility and sedentary some as patient is not candidate for oral a/c.   Recent PEA arrest - stable, asymptomatic  In the setting of acute systolic heart failure. EF 30-35%.  Now with some signs of fluid overload.  He was off medication due to hypotensive shock from bleeding, will start on IV Lasix as chest x-ray shows with vascular congestion/edema.  Resume other medications when patient tolerate oral diet.  Recently diagnosed with C. Difficile - resolved  S/p oral vancomycin course ended 8/29  BPH - stable  Tamsulosin on hold.  Goals of care  Palliative recommendations appreciated   DVT prophylaxis: Heparin ggt  Code Status: Partial  Family Communication: None at bedside  Disposition Plan: SNF when medically stable.  Consultants:   GI  PCCM  Procedures:   EGD   Antimicrobials: Anti-infectives (From admission, onward)   Start     Dose/Rate Route Frequency Ordered Stop   08/22/18 0000  vancomycin (VANCOCIN) 50 mg/mL oral solution     125 mg Oral 4 times  daily 08/22/18 1323     08/19/18 1930  vancomycin (VANCOCIN) 50 mg/mL oral solution 125 mg     125 mg Oral 4 times daily 08/19/18 1812 08/29/18 1759       Objective: Vitals:   09/09/18 1410 09/09/18 2132 09/10/18 0619 09/10/18 1123  BP: 140/73 (!) 151/66 125/67 (!) 119/97  Pulse: 91 94 96 95  Resp: 16 18 20  (!) 22  Temp:  98.4 F (36.9 C) 98.3 F (36.8 C)   TempSrc:      SpO2: 97% 95% 100% 99%  Weight:      Height:        Intake/Output Summary (Last 24 hours) at 09/10/2018 1142 Last data filed at 09/10/2018 0500 Gross per 24 hour  Intake 2119.66 ml  Output 475 ml  Net 1644.66 ml   Filed Weights   09/07/18 0517 09/08/18 0400 09/09/18 0449  Weight: (!) 143.5 kg (!) 144.5 kg (!) 146.6 kg    Examination:  General: Lethargic Cardiovascular: RRR, S1/S2 +, no rubs, no gallops Respiratory: Decreased breath sounds bilaterally, bibasilar crackles.  Using accessory muscles. Abdominal: Soft, NT, ND, bowel sounds + Extremities: Bilateral LE Euna boots in place, dry and intact hyperkeratosis of toes bilaterally Neuro: Confuse, responding to verbal stimuli but falls asleep easily Skin: No new lesions  Data Reviewed: I have personally reviewed following labs and imaging studies  CBC: Recent Labs  Lab 09/03/18 1253  09/07/18 0800 09/07/18 1341 09/08/18 0535 09/09/18 0351 09/10/18 0431  WBC 9.3   < > 10.7* 11.6* 13.3* 10.8* 10.2  NEUTROABS 6.7  --   --  9.8*  --  8.3* 8.8*  HGB 7.9*   < > 7.8* 8.4* 9.3* 8.7* 9.5*  HCT 25.3*   < > 24.5* 27.2* 30.5* 29.1* 31.9*  MCV 93.7   < > 92.1 93.8 95.9 97.3 99.1  PLT 174   < > 93* 108* 131* 138* 169   < > = values in this interval not displayed.   Basic Metabolic Panel: Recent Labs  Lab 09/06/18 0333 09/07/18 0500 09/08/18 0100 09/08/18 0535 09/09/18 0351 09/10/18 0431  NA 148* 142 147*  --  147* 150*  K 3.9 2.7* 3.9  --  3.2* 4.5  CL 105 110 108  --  114* 114*  CO2 34* 28 35*  --  29 31  GLUCOSE 169* 172* 85  --  109* 142*   BUN 32* 19 20  --  12 11  CREATININE 0.87 0.58* 0.65  --  0.53* 0.66  CALCIUM 7.8* 6.6* 8.5*  --  7.4* 9.0  MG 1.7 1.5*  --  2.2 1.7 1.9  PHOS 3.2 2.5  --  3.3 2.5 3.1   GFR: Estimated Creatinine Clearance: 113.2 mL/min (by C-G formula based on SCr of 0.66 mg/dL). Liver Function Tests: No results for input(s): AST, ALT, ALKPHOS, BILITOT, PROT, ALBUMIN in the last 168 hours. Recent Labs  Lab 09/08/18 0535  LIPASE 18   No results for input(s): AMMONIA in the last 168 hours. Coagulation Profile: Recent Labs  Lab 09/05/18 0249 09/05/18 0904  INR 1.38 1.13   Cardiac Enzymes: Recent Labs  Lab 09/05/18  0249 09/08/18 0535  TROPONINI <0.03 0.26*   BNP (last 3 results) No results for input(s): PROBNP in the last 8760 hours. HbA1C: No results for input(s): HGBA1C in the last 72 hours. CBG: Recent Labs  Lab 09/09/18 1620 09/09/18 2128 09/09/18 2354 09/10/18 0617 09/10/18 0803  GLUCAP 136* 86 90 122* 122*   Lipid Profile: No results for input(s): CHOL, HDL, LDLCALC, TRIG, CHOLHDL, LDLDIRECT in the last 72 hours. Thyroid Function Tests: No results for input(s): TSH, T4TOTAL, FREET4, T3FREE, THYROIDAB in the last 72 hours. Anemia Panel: No results for input(s): VITAMINB12, FOLATE, FERRITIN, TIBC, IRON, RETICCTPCT in the last 72 hours. Sepsis Labs: Recent Labs  Lab 09/08/18 0535  LATICACIDVEN 0.5    No results found for this or any previous visit (from the past 240 hour(s)).    Radiology Studies: No results found.  Scheduled Meds: . chlorhexidine  15 mL Mouth Rinse BID  . feeding supplement (ENSURE ENLIVE)  237 mL Oral BID BM  . insulin aspart  0-20 Units Subcutaneous Q4H  . latanoprost  1 drop Both Eyes QHS  . mouth rinse  15 mL Mouth Rinse q12n4p  . multivitamin with minerals  1 tablet Oral Daily  . pantoprazole  40 mg Oral BID AC  . sodium chloride flush  10-40 mL Intracatheter Q12H  . sucralfate  1 g Oral TID WC & HS   Continuous Infusions: . sodium  chloride 100 mL/hr at 09/10/18 0500     LOS: 22 days    Time spent: Total of 35 minutes spent with pt, greater than 50% of which was spent in discussion of  treatment, counseling and coordination of care   Chipper Oman, MD Pager: Text Page via [email or web address]   If 7PM-7AM, please contact night-coverage [email or web address] 09/10/2018, 11:42 AM   Note - This record has been created using [employer]. Chart creation errors have been sought, but may not always have been located. Such creation errors do not reflect on the standard of medical care.

## 2018-09-10 NOTE — Progress Notes (Signed)
PT Cancellation Note  Patient Details Name: [name] MRN: [id number] DOB: [birth year]   Cancelled Treatment:    Reason Eval/Treat Not Completed: Medical issues which prohibited therapy. Moved to ICU for lethargy. ABG's ordered. Will check back another time when stable,   Claretha Cooper 09/10/2018, 1:22 PM [place] Pager [phone] Office [phone]

## 2018-09-10 NOTE — Evaluation (Signed)
SLP Cancellation Note  Patient Details Name: EMILEO SEMEL MRN: [id number] DOB: [birth year]   Cancelled treatment:       Reason Eval/Treat Not Completed: Other (comment);Medical issues which prohibited therapy(pt transferred to ICU and is on Bipap, will continue efforts)   Macario Golds 09/10/2018, 12:30 PM

## 2018-09-10 NOTE — Progress Notes (Signed)
Pt continue to have increase lethergy, MD at bedsie with pt, STAT ABGs and chest xray ordered. Arouse to name call, VS stable. Will cont to monitor.

## 2018-09-10 NOTE — Progress Notes (Signed)
Patient is admitted to room 1241. He is alerted and oriented to self and place. Admission VS is stable.

## 2018-09-10 NOTE — Progress Notes (Signed)
Nutrition Follow-up  DOCUMENTATION CODES:   Non-severe (moderate) malnutrition in context of chronic illness, Obesity unspecified  INTERVENTION:    Ensure Enlive po BID, each supplement provides 350 kcal and 20 grams of protein  Magic cup BID with meals, each supplement provides 290 kcal and 9 grams of protein  Provide MVI daily  NUTRITION DIAGNOSIS:   Moderate Malnutrition related to chronic illness(chronic osteomyelitis) as evidenced by mild fat depletion, moderate muscle depletion.  Ongoing  GOAL:   Patient will meet greater than or equal to 90% of their needs  Not meeting  MONITOR:   Diet advancement, Weight trends, Labs, Skin, I & O's  REASON FOR ASSESSMENT:   Consult Assessment of nutrition requirement/status  ASSESSMENT:   77 yo male who was admitted due to melena and hypotension, hypovolemic shock d/t acute blood loss anemia complicated by AKI. He has PMH significant for He chronic osteomyelitis, CHF, type 2 DM, GERD, hiatal hernia, and HTN.  He was currently treated for C. difficile colitis, and noticed at home melanotic stools. Recent hospitalization for sepsis and cardiac arrest 07/31/18.  8/29- EGD showed ulcer and was treated with epi injection/cautery 9/10- EGD showed no signs of further bleeding, tolerating sips of clears 9/11- diet advanced to soft  Pt very lethargic this morning and difficult to wake up. RN reports pt did not eat breakfast this morning due to this. Meal completions are not up to date in records. The last meal completion charted was on 9/6. Suspect pt has not had much since diet was advanced on 9/10. RD to provide supplements to maximize calories and protein.   Weight noted to increase from 316 lb on 9/9 to 323 lb today. Will continue to monitor trends.   Medications reviewed and include: NS @ 100 ml/hr, carafate, protonix Labs reviewed: Na 150 (H)   Diet Order:   Diet Order            DIET SOFT Room service appropriate? Yes; Fluid  consistency: Thin  Diet effective now        Diet - low sodium heart healthy              EDUCATION NEEDS:   No education needs have been identified at this time  Skin:  Skin Assessment: Skin Integrity Issues: Skin Integrity Issues:: Unstageable Stage II: L heel, L thigh, coccyx, sacrum Unstageable: full thickness to R toe  Last BM:  09/08/18  Height:   Ht Readings from Last 1 Encounters:  08/19/18 6' (1.829 m)    Weight:   Wt Readings from Last 1 Encounters:  09/09/18 (!) 146.6 kg    Ideal Body Weight:  80.91 kg  BMI:  Body mass index is 43.83 kg/m.  Estimated Nutritional Needs:   Kcal:  1995-2260 (15-17 kcal/kg)  Protein:  120-130 grams  Fluid:  >/= 2 L/day   Mariana Single RD, LDN Clinical Nutrition Pager # - [phone]

## 2018-09-10 NOTE — Progress Notes (Signed)
[name] 10:28 AM  Subjective: Patient is very lethargic this morning and difficult to wake up and discussed with his nurse as well and she says he periodically gets like thisbut is not having any signs of bleeding  Objective: Vital signs stable afebrile no acute distress other than above abdomen is soft nontender BUN and hemoglobin okay other labs okay  Assessment: Duodenal ulcer with bleed multiple medical problems  Plan: Would use twice a day pump inhibitors for 2 months then decrease to once a day long-term and would use Carafate for a month and may slowly advance dietand avoid aspirin and nonsteroidals long-term and please call us if we could be of any further assistance with this hospital stay  Kerlan [facility] E  Pager [phone] After 5PM or if no answer call [phone]

## 2018-09-10 NOTE — Care Management Important Message (Signed)
Important Message  Patient Details  Name: [name] MRN: [id number] Date of Birth: [birth year]   Medicare Important Message Given:  Yes    Kerin Salen 09/10/2018, 12:23 [place] Message  Patient Details  Name: [name] MRN: [id number] Date of Birth: 1941/06/27   Medicare Important Message Given:  Yes    Kerin Salen 09/10/2018, 12:23 PM

## 2018-09-11 DIAGNOSIS — R601 Generalized edema: Secondary | ICD-10-CM | POA: Diagnosis not present

## 2018-09-11 DIAGNOSIS — I5023 Acute on chronic systolic (congestive) heart failure: Secondary | ICD-10-CM | POA: Diagnosis not present

## 2018-09-11 DIAGNOSIS — J9600 Acute respiratory failure, unspecified whether with hypoxia or hypercapnia: Secondary | ICD-10-CM

## 2018-09-11 DIAGNOSIS — I2699 Other pulmonary embolism without acute cor pulmonale: Secondary | ICD-10-CM | POA: Diagnosis not present

## 2018-09-11 DIAGNOSIS — I10 Essential (primary) hypertension: Secondary | ICD-10-CM

## 2018-09-11 DIAGNOSIS — Z7409 Other reduced mobility: Secondary | ICD-10-CM

## 2018-09-11 DIAGNOSIS — K269 Duodenal ulcer, unspecified as acute or chronic, without hemorrhage or perforation: Secondary | ICD-10-CM

## 2018-09-11 LAB — BLOOD GAS, ARTERIAL
Acid-Base Excess: 5.6 mmol/L — ABNORMAL HIGH (ref 0.0–2.0)
Bicarbonate: 31.6 mmol/L — ABNORMAL HIGH (ref 20.0–28.0)
Delivery systems: POSITIVE
Drawn by: 232811
Expiratory PAP: 6
FIO2: 30
Inspiratory PAP: 20
O2 Saturation: 97.3 %
Patient temperature: 98.6
pCO2 arterial: 55.3 mmHg — ABNORMAL HIGH (ref 32.0–48.0)
pH, Arterial: 7.375 (ref 7.350–7.450)
pO2, Arterial: 90.4 mmHg (ref 83.0–108.0)

## 2018-09-11 LAB — CBC WITH DIFFERENTIAL/PLATELET
Basophils Absolute: 0 10*3/uL (ref 0.0–0.1)
Basophils Relative: 0 %
EOS ABS: 0.4 10*3/uL (ref 0.0–0.7)
Eosinophils Relative: 5 %
HCT: 29.5 % — ABNORMAL LOW (ref 39.0–52.0)
HEMOGLOBIN: 8.9 g/dL — AB (ref 13.0–17.0)
LYMPHS ABS: 1 10*3/uL (ref 0.7–4.0)
LYMPHS PCT: 12 %
MCH: 29.5 pg (ref 26.0–34.0)
MCHC: 30.2 g/dL (ref 30.0–36.0)
MCV: 97.7 fL (ref 78.0–100.0)
MONOS PCT: 9 %
Monocytes Absolute: 0.7 10*3/uL (ref 0.1–1.0)
NEUTROS PCT: 74 %
Neutro Abs: 6.4 10*3/uL (ref 1.7–7.7)
Platelets: 184 10*3/uL (ref 150–400)
RBC: 3.02 MIL/uL — AB (ref 4.22–5.81)
RDW: 15.3 % (ref 11.5–15.5)
WBC: 8.5 10*3/uL (ref 4.0–10.5)

## 2018-09-11 LAB — BASIC METABOLIC PANEL
Anion gap: 6 (ref 5–15)
BUN: 9 mg/dL (ref 8–23)
CHLORIDE: 112 mmol/L — AB (ref 98–111)
CO2: 32 mmol/L (ref 22–32)
CREATININE: 0.7 mg/dL (ref 0.61–1.24)
Calcium: 8.5 mg/dL — ABNORMAL LOW (ref 8.9–10.3)
GFR calc Af Amer: >60 mL/min (ref >60)
GFR calc non Af Amer: >60 mL/min (ref >60)
GLUCOSE: 91 mg/dL (ref 70–99)
POTASSIUM: 4.2 mmol/L (ref 3.5–5.1)
Sodium: 150 mmol/L — ABNORMAL HIGH (ref 135–145)

## 2018-09-11 LAB — GLUCOSE, CAPILLARY
GLUCOSE-CAPILLARY: 79 mg/dL (ref 70–99)
GLUCOSE-CAPILLARY: 114 mg/dL — AB (ref 70–99)
Glucose-Capillary: 106 mg/dL — ABNORMAL HIGH (ref 70–99)
Glucose-Capillary: 138 mg/dL — ABNORMAL HIGH (ref 70–99)
Glucose-Capillary: 82 mg/dL (ref 70–99)
Glucose-Capillary: 84 mg/dL (ref 70–99)

## 2018-09-11 LAB — MAGNESIUM: Magnesium: 1.6 mg/dL — ABNORMAL LOW (ref 1.7–2.4)

## 2018-09-11 LAB — PHOSPHORUS: Phosphorus: 2.5 mg/dL (ref 2.5–4.6)

## 2018-09-11 MED ORDER — HYDRALAZINE HCL 20 MG/ML IJ SOLN
10.0000 mg | Freq: Four times a day (QID) | INTRAMUSCULAR | Status: DC | PRN
  Administered 2018-09-11: 20 mg via INTRAVENOUS
  Filled 2018-09-11: qty 1

## 2018-09-11 MED ORDER — FUROSEMIDE 10 MG/ML IJ SOLN
20.0000 mg | Freq: Two times a day (BID) | INTRAMUSCULAR | Status: DC
  Administered 2018-09-11 – 2018-09-12 (×4): 20 mg via INTRAVENOUS
  Filled 2018-09-11 (×5): qty 2

## 2018-09-11 MED ORDER — DEXTROSE 5 % IV SOLN
INTRAVENOUS | Status: DC
  Administered 2018-09-11 – 2018-09-12 (×3): via INTRAVENOUS

## 2018-09-11 MED ORDER — CARVEDILOL 12.5 MG PO TABS
25.0000 mg | ORAL_TABLET | Freq: Two times a day (BID) | ORAL | Status: DC
  Administered 2018-09-11 – 2018-09-12 (×3): 25 mg via ORAL
  Filled 2018-09-11 (×5): qty 2

## 2018-09-11 NOTE — Progress Notes (Signed)
Pt took off BIPAP this am by RN.

## 2018-09-11 NOTE — Progress Notes (Signed)
Hypoglycemic Event  CBG: 61  Treatment: D50 IV 25 mL  Symptoms: None  Follow-up CBG: Time:0020 CBG Result:84  Possible Reasons for Event: Unknown  Comments/MD notified:N/A    [name], [place]

## 2018-09-11 NOTE — Evaluation (Signed)
SLP Cancellation Note  Patient Details Name: ARCH METHOT MRN: [id number] DOB: [birth year]   Cancelled treatment:       Reason Eval/Treat Not Completed: Other (comment);Medical issues which prohibited therapy(pt on bipap currently, will continue efforts)   Macario Golds 09/11/2018, 12:42 PM   Luanna Salk, [place] [facility] SLP Acute Rehab Services Pager [phone] Office [phone]

## 2018-09-11 NOTE — Progress Notes (Signed)
PROGRESS NOTE [facility]   [name]   XBL:[id number] DOB: 1941-01-23  DOA: 08/19/2018 PCP: [place] Bing, DO   Brief Narrative:  [name] 77 y/o M with PMHx of 77 year old male with past medical history significant for chronic systolic and diastolic heart failure, hyperlipidemia, BPH and recently treated for C. difficile colitis presented to the emergency department on 8/21 complaining of black tarry stools and hypotension.  Patient was subsequently found to be hypoxic, anemic with hemoglobin of 6.7.  Patient was transfused and scheduled for outpatient GI work-up however on 8/24 patient declined euvolemic shock and was transferred to the ICU on 08/27/2018, patient underwent EGD which showed large duodenal bulb ulcer with visible vessel and was treated with epi injection and gold probe cautery.  Patient remained hypoxic, 9/4 CTA of the chest revealed Acute PE.  Patient was started on heparin drip, case was discussed with GI who recommended 48 hours of heparin and if no bleeding switch to oral anticoagulation. eliquis started on 9/6. On 9/7 patient developed hematemesis, became hypotensive and was transferred to the ICU for pressors, found to have massive GI bleed.  Anticoagulation was reversed, he was transfused 4 more units of PRBC's and was embolized by IR. 9/9 pressors were d/c and patient remained stable. Patient transferred to [facility] for further management   Subjective: Patient seen and examined, he is lethargic and difficult to arouse. This is new from yesterday. He O2 was on 3.5L. Unable to interview due to lethargy. ABG shows hypercarbia.   Assessment & Plan: 1) Acute respiratory failure with hypoxia due to PE/ acute on chronic hypercarbia/ CHF decomp EF 30-40%/ uncont HTN:  Chest CTA + for PE.  Patient with acute hypoxia.  ABG shows hypoxia and hypercarbia.  Transferred to STU, started BiPAP.  Chest x-ray ordered and shows increasing vascular congestion/edema will started  IV Lasix.  Oxygen supplementation to keep saturation between 89 and 94%. - marked diuresis yest , gross anasarca on exam up 20 kg from admit - dc 100 cc/hr saline - cont IV lasix 20 bid - add po coreg 25 bid and prn IV hydral for ^BP  2) Massive Upper GI bleed - stable Due to bleeding vessel at the duodenal bulb ulcer. Patient has been transfused total of 12 units of PRBCs during hospital stay.  Underwent EGD 8/29 and ulcer was treated with epi injection and gold probe cautery, however had re-bleed after started on Eliquis patient underwent embolization by IR on 9/7. F/U EGD was performed at bedside on 9/10 with no signs of further bleeding.  Continue to monitor hemoglobin.  Advance diet per GI recommendation.  Continue Carafate and PPI.     3) Acute PE  CTA 9/4 showed LUL segmental pulmonary emboli in the segmental branches of the left upper lobe.  Started with venous insufficiency.  Doppler ultrasound of the lower extremity was negative for DVT.  Patient was treated with heparin drip, subsequently switched to Eliquis and developed massive GI bleed.  Patient not candidate for anticoagulation.  Consider IVC filter.    4) Recent PEA arrest - stable, asymptomatic  In the setting of acute systolic heart failure. EF 30-35%  Recently diagnosed with C. Difficile - resolved  S/p oral vancomycin course ended 8/29  BPH - stable  Tamsulosin on hold.  Goals of care  Palliative recommendations appreciated   Hypernatremia: hypervolemic, dc NS at 100 cc/ hr, diurese and start some D5W at 65/ hr.     DVT prophylaxis:  SCD Code Status: Partial  Family Communication: None at bedside  Disposition Plan: SNF when medically stable.   Kelly Splinter MD [facility] pgr [phone] 05/24/2018, 9:25 AM  Consultants:   GI  PCCM  Procedures:   EGD   Antimicrobials: Anti-infectives (From admission, onward)   Start     Dose/Rate Route Frequency Ordered Stop   08/22/18 0000   vancomycin (VANCOCIN) 50 mg/mL oral solution     125 mg Oral 4 times daily 08/22/18 1323     08/19/18 1930  vancomycin (VANCOCIN) 50 mg/mL oral solution 125 mg     125 mg Oral 4 times daily 08/19/18 1812 08/29/18 1759       Objective: Vitals:   09/11/18 0400 09/11/18 0500 09/11/18 0600 09/11/18 0800  BP: (!) 151/47 (!) 170/66 (!) 152/56 (!) 164/53  Pulse: 62 63 (!) 57 (!) 58  Resp: 15 15 14 14   Temp:    98.4 F (36.9 C)  TempSrc:    Oral  SpO2: 100% 100% 100% 100%  Weight:  (!) 148.1 kg    Height:        Intake/Output Summary (Last 24 hours) at 09/11/2018 0834 Last data filed at 09/11/2018 0529 Gross per 24 hour  Intake 2765.2 ml  Output 2850 ml  Net -84.8 ml   Filed Weights   09/09/18 0449 09/10/18 1249 09/11/18 0500  Weight: (!) 146.6 kg (!) 148.5 kg (!) 148.1 kg    Examination:  General: Lethargic, on nasal cannula Cardiovascular: RRR, S1/S2 +, no rubs, no gallops Respiratory: Decreased breath sounds bilaterally, bibasilar crackles.  Using accessory muscles. Abdominal: Soft, NT, ND, bowel sounds + Extremities: Bilateral LE Euna boots in place, diffuse 2-3+ edema of UE's and LE's Neuro: Confuse, responding to verbal stimuli but falls asleep easily Skin: No new lesions  Data Reviewed: I have personally reviewed following labs and imaging studies  CBC: Recent Labs  Lab 09/07/18 1341 09/08/18 0535 09/09/18 0351 09/10/18 0431 09/11/18 0339  WBC 11.6* 13.3* 10.8* 10.2 8.5  NEUTROABS 9.8*  --  8.3* 8.8* 6.4  HGB 8.4* 9.3* 8.7* 9.5* 8.9*  HCT 27.2* 30.5* 29.1* 31.9* 29.5*  MCV 93.8 95.9 97.3 99.1 97.7  PLT 108* 131* 138* 169 098   Basic Metabolic Panel: Recent Labs  Lab 09/07/18 0500 09/08/18 0100 09/08/18 0535 09/09/18 0351 09/10/18 0431 09/11/18 0339  NA 142 147*  --  147* 150* 150*  K 2.7* 3.9  --  3.2* 4.5 4.2  CL 110 108  --  114* 114* 112*  CO2 28 35*  --  29 31 32  GLUCOSE 172* 85  --  109* 142* 91  BUN 19 20  --  12 11 9   CREATININE 0.58*  0.65  --  0.53* 0.66 0.70  CALCIUM 6.6* 8.5*  --  7.4* 9.0 8.5*  MG 1.5*  --  2.2 1.7 1.9 1.6*  PHOS 2.5  --  3.3 2.5 3.1 2.5   GFR: Estimated Creatinine Clearance: 113.9 mL/min (by C-G formula based on SCr of 0.7 mg/dL). Liver Function Tests: No results for input(s): AST, ALT, ALKPHOS, BILITOT, PROT, ALBUMIN in the last 168 hours. Recent Labs  Lab 09/08/18 0535  LIPASE 18   No results for input(s): AMMONIA in the last 168 hours. Coagulation Profile: Recent Labs  Lab 09/05/18 0249 09/05/18 0904  INR 1.38 1.13   Cardiac Enzymes: Recent Labs  Lab 09/05/18 0249 09/08/18 0535  TROPONINI <0.03 0.26*   BNP (last 3 results) No results  for input(s): PROBNP in the last 8760 hours. HbA1C: No results for input(s): HGBA1C in the last 72 hours. CBG: Recent Labs  Lab 09/10/18 1950 09/10/18 2331 09/11/18 0020 09/11/18 0322 09/11/18 0729  GLUCAP 73 61* 84 82 79   Lipid Profile: No results for input(s): CHOL, HDL, LDLCALC, TRIG, CHOLHDL, LDLDIRECT in the last 72 hours. Thyroid Function Tests: No results for input(s): TSH, T4TOTAL, FREET4, T3FREE, THYROIDAB in the last 72 hours. Anemia Panel: No results for input(s): VITAMINB12, FOLATE, FERRITIN, TIBC, IRON, RETICCTPCT in the last 72 hours. Sepsis Labs: Recent Labs  Lab 09/08/18 0535  LATICACIDVEN 0.5    No results found for this or any previous visit (from the past 240 hour(s)).    Radiology Studies: Dg Chest Port 1 View  Result Date: 09/10/2018 CLINICAL DATA:  Increasing lethargy EXAM: PORTABLE CHEST 1 VIEW COMPARISON:  09/06/2018 FINDINGS: Haziness of the bilateral chest attributed to layering pleural fluid and probable atelectasis. Pneumonia would easily be obscured. There is cardiomegaly and vascular pedicle widening accentuated by rotation. Cephalized blood flow and airway cuffing. PICC on the right with tip at the SVC. A nasogastric tube has been removed. IMPRESSION: Stable haziness of the chest attributed to  layering effusions and lung opacification. There is pulmonary vascular congestion. Electronically Signed   By: Monte Fantasia M.D.   On: 09/10/2018 12:05    Scheduled Meds: . chlorhexidine  15 mL Mouth Rinse BID  . feeding supplement (ENSURE ENLIVE)  237 mL Oral BID BM  . furosemide  20 mg Intravenous Daily  . insulin aspart  0-20 Units Subcutaneous Q4H  . latanoprost  1 drop Both Eyes QHS  . mouth rinse  15 mL Mouth Rinse q12n4p  . multivitamin with minerals  1 tablet Oral Daily  . pantoprazole  40 mg Oral BID AC  . sodium chloride flush  10-40 mL Intracatheter Q12H  . sucralfate  1 g Oral TID WC & HS   Continuous Infusions: . sodium chloride 100 mL/hr at 09/11/18 0526     LOS: 23 days    Time spent: Total of 35 minutes spent with pt, greater than 50% of which was spent in discussion of  treatment, counseling and coordination of care    If 7PM-7AM, please contact night-coverage [email or web address] 09/11/2018, 8:34 AM   Note - This record has been created using [employer]. Chart creation errors have been sought, but may not always have been located. Such creation errors do not reflect on the standard of medical care.

## 2018-09-11 NOTE — Care Management (Signed)
  Discharge Readiness Milestones  (1st Day of chart review)  MCG Used: respiratory failure Optimal GLOS: 2 Hospital Day: 23  Discharge Readiness Milestones Discharge Criteria Return to top of Respiratory Failure GRG - GRG [Expand All / Collapse All]  Continued inpatient stay is needed until 1 or more of the following are present: ? Acceptable patient status for next level of care is achieved. ? ALL of the following are present:  Weaning assessment performed   Weaning trials attempted failed    Ventilation status appropriate remains on Biap    Airway status acceptable patent    Respiratory status acceptable no still requires bipap for support    Stable chest findings Stable haziness of the chest attributed to layering effusions and lung opacification. There is pulmonary vascular congestion.    No chest tube, or status acceptable    Neurologic status acceptable    Temperature status acceptable    No infection, or status acceptable    Activity level acceptable /bed rest   Identified Barriers: Date/Name case discussed with attending MD (if applicable):  Date/name case discussed with supervisor (if applicable):  Date/name case referred to physician advisor (if applicable)   Concurrent Review - Discharge Readiness Milestones, Not Met

## 2018-09-11 NOTE — Progress Notes (Signed)
PT Cancellation Note  Patient Details Name: [name] MRN: [id number] DOB: [birth year]   Cancelled Treatment:     PT deferred this am.  Pt on bipap - RN advises will attempt weaning this date.  Will follow.   Maliea Grandmaison 09/11/2018, 8:25 AM

## 2018-09-12 ENCOUNTER — Encounter (HOSPITAL_COMMUNITY): Payer: Self-pay | Admitting: *Deleted

## 2018-09-12 DIAGNOSIS — J9622 Acute and chronic respiratory failure with hypercapnia: Secondary | ICD-10-CM

## 2018-09-12 DIAGNOSIS — J9621 Acute and chronic respiratory failure with hypoxia: Secondary | ICD-10-CM

## 2018-09-12 LAB — GLUCOSE, CAPILLARY
GLUCOSE-CAPILLARY: 123 mg/dL — AB (ref 70–99)
GLUCOSE-CAPILLARY: 77 mg/dL (ref 70–99)
GLUCOSE-CAPILLARY: 79 mg/dL (ref 70–99)
GLUCOSE-CAPILLARY: 89 mg/dL (ref 70–99)
Glucose-Capillary: 133 mg/dL — ABNORMAL HIGH (ref 70–99)
Glucose-Capillary: 142 mg/dL — ABNORMAL HIGH (ref 70–99)
Glucose-Capillary: 155 mg/dL — ABNORMAL HIGH (ref 70–99)

## 2018-09-12 LAB — BASIC METABOLIC PANEL
ANION GAP: 6 (ref 5–15)
BUN: 12 mg/dL (ref 8–23)
CHLORIDE: 106 mmol/L (ref 98–111)
CO2: 34 mmol/L — ABNORMAL HIGH (ref 22–32)
Calcium: 8.3 mg/dL — ABNORMAL LOW (ref 8.9–10.3)
Creatinine, Ser: 0.79 mg/dL (ref 0.61–1.24)
Glucose, Bld: 91 mg/dL (ref 70–99)
POTASSIUM: 3.3 mmol/L — AB (ref 3.5–5.1)
Sodium: 146 mmol/L — ABNORMAL HIGH (ref 135–145)

## 2018-09-12 MED ORDER — POTASSIUM CHLORIDE CRYS ER 20 MEQ PO TBCR
40.0000 meq | EXTENDED_RELEASE_TABLET | Freq: Two times a day (BID) | ORAL | Status: DC
  Administered 2018-09-12 – 2018-09-19 (×15): 40 meq via ORAL
  Filled 2018-09-12 (×15): qty 2

## 2018-09-12 MED ORDER — VITAMINS A & D EX OINT
TOPICAL_OINTMENT | CUTANEOUS | Status: AC
  Administered 2018-09-12: 1
  Filled 2018-09-12: qty 5

## 2018-09-12 MED ORDER — CHLORHEXIDINE GLUCONATE CLOTH 2 % EX PADS
6.0000 | MEDICATED_PAD | Freq: Every day | CUTANEOUS | Status: DC
  Administered 2018-09-12 – 2018-09-19 (×7): 6 via TOPICAL

## 2018-09-12 NOTE — Progress Notes (Addendum)
PROGRESS NOTE [facility]   [name]   STM:[id number] DOB: 09-20-1941  DOA: 08/19/2018 PCP: [place] Bing, DO   Brief Narrative:  [name] 77 y/o M with PMHx of 77 year old male with past medical history significant for chronic systolic and diastolic heart failure, hyperlipidemia, BPH and recently treated for C. difficile colitis presented to the emergency department on 8/21 complaining of black tarry stools and hypotension.  Patient was subsequently found to be hypoxic, anemic with hemoglobin of 6.7.  Patient was transfused and scheduled for outpatient GI work-up however on 8/24 patient declined euvolemic shock and was transferred to the ICU on 08/27/2018, patient underwent EGD which showed large duodenal bulb ulcer with visible vessel and was treated with epi injection and gold probe cautery.  Patient remained hypoxic, 9/4 CTA of the chest revealed Acute PE.  Patient was started on heparin drip, case was discussed with GI who recommended 48 hours of heparin and if no bleeding switch to oral anticoagulation. eliquis started on 9/6. On 9/7 patient developed hematemesis, became hypotensive and was transferred to the ICU for pressors, found to have massive GI bleed.  Anticoagulation was reversed, he was transfused 4 more units of PRBC's and was embolized by IR. 9/9 pressors were d/c and patient remained stable. Patient transferred to [facility] for further management   Subjective: Patient seen and examined, he is lethargic and difficult to arouse. This is new from yesterday. He O2 was on 3.5L. Unable to interview due to lethargy. ABG shows hypercarbia.   Assessment & Plan: 1) Acute on chronic hypercarbia resp failure due to CHF decomp EF 30-40%/ uncont HTN:  Patient with acute hypoxia.  ABG showed hypoxia and hypercarbia.  Transferred to STU, started BiPAP.  Chest x-ray ordered and shows increasing vascular congestion/edema will started IV Lasix.  Oxygen supplementation to keep  saturation between 89 and 94%. Wt's showed up 20kg from lowest admit wt.  - continues to diurese, 2.9 L UOP again yesterday, wt's down 3kg from peak, still bilat rales on exam and anasarca but looks some better today - cont IV lasix 20 bid - added po coreg 25 bid and prn IV hydral for HTN  2) Massive Upper GI bleed - stable Due to bleeding vessel at the duodenal bulb ulcer. Patient rec'd total 12 units PRBCs during hospital stay.  Underwent EGD 8/29 and ulcer was treated with epi injection and gold probe cautery, however had re-bleed after started on Eliquis and he underwent embolization by IR on 9/7. F/U EGD was performed at bedside on 9/10 with no signs of further bleeding.  Continue to monitor hemoglobin.  Advance diet per GI recommendation.  Continue Carafate and PPI.     3) Acute PE:  CTA 9/4 showed LUL segmental pulmonary emboli in the segmental branches of the left upper lobe.  Started with venous insufficiency.  Doppler ultrasound of the lower extremities was negative for DVT.  Patient was treated with heparin drip, subsequently switched to Eliquis and developed massive GI bleed.  Patient not candidate for anticoagulation.  Consider IVC filter.    4) Recent PEA arrest 07/31/18 - during last admit for sepsis/ PNA/ UTI; 2 min arrest, unclear cause per notes.  5) Recent admit for sepsis/ UTI/ PNA/ SSTI - treated w/ abx and dc'd to [facility]F on 08/07/18  6) Recently diagnosed with C. Difficile - was dx'd w/ +Cdif on 8/7 during last admission, was +Cdif here again this admission on 08/19/18 and received oral vanc course  through 08/27/18.   7) Partial Code: no intubation and no CPR, yes to shock/ meds/ bipap.   8) Goals of care  Palliative recommendations appreciated. Spoke w/ patient today briefly about prognosis and how modern medicine will not be able to fix all his problems. He seemed to understand but didn't ask a lot of questions. I spoke w/ patient's daughter Cheri Rous today about  poor prognosis overall, she said her father has a strong will to make his own decisions and that she wants him to make his own EOL decisions if he is able to. But also that she will of course help to make decisions for him if he is mentally unable.    Hypernatremia: hypervolemic -  cont IV lasix for vol overload and cont D5W at 65/ hr. Serum Na coming down    DVT prophylaxis: SCD Code Status: Partial  Family Communication: spoke w dtr Ivin Booty today  Disposition Plan: prob ready for transfer to tele bed in 1-2 days   Kelly Splinter MD [facility] pgr [phone] 05/24/2018, 9:25 AM  Consultants:   GI  PCCM  Procedures:   EGD   Antimicrobials: Anti-infectives (From admission, onward)   Start     Dose/Rate Route Frequency Ordered Stop   08/22/18 0000  vancomycin (VANCOCIN) 50 mg/mL oral solution     125 mg Oral 4 times daily 08/22/18 1323     08/19/18 1930  vancomycin (VANCOCIN) 50 mg/mL oral solution 125 mg     125 mg Oral 4 times daily 08/19/18 1812 08/29/18 1759       Objective: Vitals:   09/12/18 0405 09/12/18 0445 09/12/18 0600 09/12/18 0800  BP: 100/64 (!) 113/58 (!) 104/52 (!) 123/57  Pulse: 70 61 60 66  Resp: 15 16 17  (!) 21  Temp:    98.9 F (37.2 C)  TempSrc:    Axillary  SpO2: 97% 100% 100% 100%  Weight: (!) 145 kg     Height:        Intake/Output Summary (Last 24 hours) at 09/12/2018 0850 Last data filed at 09/12/2018 0800 Gross per 24 hour  Intake 1574.14 ml  Output 2975 ml  Net -1400.86 ml   Filed Weights   09/10/18 1249 09/11/18 0500 09/12/18 0405  Weight: (!) 148.5 kg (!) 148.1 kg (!) 145 kg    Examination:  General: Lethargic, on nasal cannula Cardiovascular: RRR, S1/S2 +, no rubs, no gallops Respiratory: Decreased breath sounds bilaterally, bibasilar crackles.  Using accessory muscles but not as much today Abdominal: Soft, NT, ND, bowel sounds + Extremities: Bilateral LE Euna boots in place, diffuse 2-3+ edema of UE's and  LE's Neuro: Confuse, responding to verbal stimuli but falls asleep easily Skin: No new lesions  Data Reviewed: I have personally reviewed following labs and imaging studies  CBC: Recent Labs  Lab 09/07/18 1341 09/08/18 0535 09/09/18 0351 09/10/18 0431 09/11/18 0339  WBC 11.6* 13.3* 10.8* 10.2 8.5  NEUTROABS 9.8*  --  8.3* 8.8* 6.4  HGB 8.4* 9.3* 8.7* 9.5* 8.9*  HCT 27.2* 30.5* 29.1* 31.9* 29.5*  MCV 93.8 95.9 97.3 99.1 97.7  PLT 108* 131* 138* 169 756   Basic Metabolic Panel: Recent Labs  Lab 09/07/18 0500 09/08/18 0100 09/08/18 0535 09/09/18 0351 09/10/18 0431 09/11/18 0339 09/12/18 0627  NA 142 147*  --  147* 150* 150* 146*  K 2.7* 3.9  --  3.2* 4.5 4.2 3.3*  CL 110 108  --  114* 114* 112* 106  CO2 28  35*  --  29 31 32 34*  GLUCOSE 172* 85  --  109* 142* 91 91  BUN 19 20  --  12 11 9 12   CREATININE 0.58* 0.65  --  0.53* 0.66 0.70 0.79  CALCIUM 6.6* 8.5*  --  7.4* 9.0 8.5* 8.3*  MG 1.5*  --  2.2 1.7 1.9 1.6*  --   PHOS 2.5  --  3.3 2.5 3.1 2.5  --    GFR: Estimated Creatinine Clearance: 112.6 mL/min (by C-G formula based on SCr of 0.79 mg/dL). Liver Function Tests: No results for input(s): AST, ALT, ALKPHOS, BILITOT, PROT, ALBUMIN in the last 168 hours. Recent Labs  Lab 09/08/18 0535  LIPASE 18   No results for input(s): AMMONIA in the last 168 hours. Coagulation Profile: Recent Labs  Lab 09/05/18 0904  INR 1.13   Cardiac Enzymes: Recent Labs  Lab 09/08/18 0535  TROPONINI 0.26*   BNP (last 3 results) No results for input(s): PROBNP in the last 8760 hours. HbA1C: No results for input(s): HGBA1C in the last 72 hours. CBG: Recent Labs  Lab 09/11/18 1622 09/11/18 2003 09/12/18 0017 09/12/18 0318 09/12/18 0725  GLUCAP 138* 106* 89 133* 77   Lipid Profile: No results for input(s): CHOL, HDL, LDLCALC, TRIG, CHOLHDL, LDLDIRECT in the last 72 hours. Thyroid Function Tests: No results for input(s): TSH, T4TOTAL, FREET4, T3FREE, THYROIDAB in the  last 72 hours. Anemia Panel: No results for input(s): VITAMINB12, FOLATE, FERRITIN, TIBC, IRON, RETICCTPCT in the last 72 hours. Sepsis Labs: Recent Labs  Lab 09/08/18 0535  LATICACIDVEN 0.5    No results found for this or any previous visit (from the past 240 hour(s)).    Radiology Studies: Dg Chest Port 1 View  Result Date: 09/10/2018 CLINICAL DATA:  Increasing lethargy EXAM: PORTABLE CHEST 1 VIEW COMPARISON:  09/06/2018 FINDINGS: Haziness of the bilateral chest attributed to layering pleural fluid and probable atelectasis. Pneumonia would easily be obscured. There is cardiomegaly and vascular pedicle widening accentuated by rotation. Cephalized blood flow and airway cuffing. PICC on the right with tip at the SVC. A nasogastric tube has been removed. IMPRESSION: Stable haziness of the chest attributed to layering effusions and lung opacification. There is pulmonary vascular congestion. Electronically Signed   By: Monte Fantasia M.D.   On: 09/10/2018 12:05    Scheduled Meds: . carvedilol  25 mg Oral BID WC  . chlorhexidine  15 mL Mouth Rinse BID  . Chlorhexidine Gluconate Cloth  6 each Topical Q0600  . feeding supplement (ENSURE ENLIVE)  237 mL Oral BID BM  . furosemide  20 mg Intravenous Q12H  . insulin aspart  0-20 Units Subcutaneous Q4H  . latanoprost  1 drop Both Eyes QHS  . mouth rinse  15 mL Mouth Rinse q12n4p  . multivitamin with minerals  1 tablet Oral Daily  . pantoprazole  40 mg Oral BID AC  . potassium chloride  40 mEq Oral BID  . sodium chloride flush  10-40 mL Intracatheter Q12H  . sucralfate  1 g Oral TID WC & HS   Continuous Infusions: . dextrose 50 mL/hr at 09/12/18 0217     LOS: 24 days    Time spent: Total of 35 minutes spent with pt, greater than 50% of which was spent in discussion of  treatment, counseling and coordination of care    If 7PM-7AM, please contact night-coverage [email or web address] 09/12/2018, 8:50 AM   Note - This record has been created  using [employer]. Chart  creation errors have been sought, but may not always have been located. Such creation errors do not reflect on the standard of medical care.

## 2018-09-12 NOTE — Evaluation (Signed)
Clinical/Bedside Swallow Evaluation Patient Details  Name: [name] MRN: [id number] Date of Birth: 03-Feb-1941  Today's Date: 09/12/2018 Time: SLP Start Time (ACUTE ONLY): 4270 SLP Stop Time (ACUTE ONLY): 1555 SLP Time Calculation (min) (ACUTE ONLY): 10 min  Past Medical History:  Past Medical History:  Diagnosis Date  . Chronic osteomyelitis of lower leg (HCC) 11/20/2010   Chronic osteoarthritis of right lower leg-10/03/10 note by Dr. Harvie Heck scanned of this visit under media tab. Dr. Sharol Given states that this is chronic osteomyelitis, only cure would be amputation. Patient was not agreeable to this at his visit with Dr. Sharol Given. Patient elected to apply Bactroban cream, Dr. Sharol Given and time of that visit also prescribe doxycycline. Dr. Sharol Given stay the patient she come to the [place] 02/26/2007   Qualifier: Diagnosis of  By: Erling Cruz  MD, [place]    . Complication of anesthesia   . Diabetes mellitus   . Diabetic peripheral neuropathy associated with type 2 diabetes mellitus ([place]) 02/07/2014  . GERD (gastroesophageal reflux disease)   . Grade II diastolic dysfunction   . Heart murmur    last 2D Echo -03/31/08  . HIATAL HERNIA WITH REFLUX 09/29/2006   Qualifier: Diagnosis of  By: Erling Cruz  MD, MELISSA    . HIP REPLACEMENT, BILATERAL, HX OF 05/12/2007   Qualifier: Diagnosis of  By: Erling Cruz  MD, MELISSA    . History of blood transfusion   . Hypertension   . OSTEOARTHRITIS 03/05/2007   Qualifier: Diagnosis of  By: Erling Cruz  MD, MELISSA    . PONV (postoperative nausea and vomiting)   . Prostate cancer ([place])   . SBO (small bowel obstruction) ([place]) 02/2017  . Seasonal allergies   . VENOUS INSUFFICIENCY, CHRONIC 02/26/2007   Qualifier: Diagnosis of  By: Erling Cruz  MD, MELISSA     Past Surgical History:  Past Surgical History:  Procedure Laterality Date  . COLONOSCOPY    . COLONOSCOPY    . ESOPHAGOGASTRODUODENOSCOPY (EGD) WITH PROPOFOL N/A 08/27/2018   Procedure: ESOPHAGOGASTRODUODENOSCOPY (EGD) WITH PROPOFOL  HEMOSPRAY ;  Surgeon: Otis Brace, MD;  Location: [facility];  Service: Gastroenterology;  Laterality: N/A;  bedside EGD  . ESOPHAGOGASTRODUODENOSCOPY (EGD) WITH PROPOFOL N/A 09/08/2018   Procedure: ESOPHAGOGASTRODUODENOSCOPY (EGD) WITH PROPOFOL;  Surgeon: Clarene Essex, MD;  Location: [facility];  Service: Endoscopy;  Laterality: N/A;  . HIP ARTHROPLASTY     bil  . HOT HEMOSTASIS N/A 08/27/2018   Procedure: HOT HEMOSTASIS (ARGON PLASMA COAGULATION/BICAP);  Surgeon: Otis Brace, MD;  Location: [facility];  Service: Gastroenterology;  Laterality: N/A;  bicap  . I&D EXTREMITY  08/14/2012   Procedure: IRRIGATION AND DEBRIDEMENT EXTREMITY;  Surgeon: Newt Minion, MD;  Location: [place];  Service: Orthopedics;  Laterality: Right;  Irrigation and Debridement Right tibia, Placement antibiotic beads  . I&D EXTREMITY Left 03/18/2014   Procedure: IRRIGATION AND DEBRIDEMENT EXTREMITY;  Surgeon: Newt Minion, MD;  Location: [place];  Service: Orthopedics;  Laterality: Left;  Debridement Left Calf Ulcer, Apply Theraskin and Wound VAC  . IR ANGIOGRAM SELECTIVE EACH ADDITIONAL VESSEL  09/05/2018  . IR ANGIOGRAM SELECTIVE EACH ADDITIONAL VESSEL  09/05/2018  . IR ANGIOGRAM SELECTIVE EACH ADDITIONAL VESSEL  09/05/2018  . IR ANGIOGRAM VISCERAL SELECTIVE  09/05/2018  . IR ANGIOGRAM VISCERAL SELECTIVE  09/05/2018  . IR EMBO ART  VEN HEMORR LYMPH EXTRAV  INC GUIDE ROADMAPPING  09/05/2018  . IR US GUIDE VASC ACCESS RIGHT  09/05/2018  . IRRIGATION AND DEBRIDEMENT ABSCESS Left 03/18/2014  DR DUDA  . LEG SURGERY     ROD for fracture  . LEG WOUND REPAIR / CLOSURE     Beads .  Skin wound  . SKIN GRAFT     right leg- from donor skin  . SKIN GRAFT  1976   R wrist   skin graft from left thigh  . SUBMUCOSAL INJECTION  08/27/2018   Procedure: SUBMUCOSAL INJECTION;  Surgeon: Otis Brace, MD;  Location: [facility];  Service: Gastroenterology;;  . UPPER GASTROINTESTINAL ENDOSCOPY    . WISDOM TOOTH EXTRACTION    .  WRIST FUSION     right wrist   HPI:  Patient is a 77 y.o. male with PMH: systolic and diastolic heart failure, hyperlipidemia, BPH and recently treated for C. difficile colitis,  presented to the emergency department on 8/21 complaining of black tarry stools and hypotension. He was found to be hyopoxic, anemic with hemoglobin of 6/7. Patient underwent EGD which showed large duodenal bulb ulcer with visable vessel and was treated with epi injection and gold probe cautery. CTA of chest on 9/4 revealed acute PE and patient was place on heparin drip. Patient became hypotensive on 9/7 and had a massive GI bleed. Patient was take off BiPap on 9/13 and is currently on O2 via nasal cannula.   Assessment / Plan / Recommendation Clinical Impression  Patient presents with a oropharyngeal swallow that is WFL-WNL with no overt s/s of aspiration or penetration. Patient declined solid textures but per RN, he has been tolerating a Dys 3, thin liquid diet without any difficulty. Patient denies any difficulty with swallowing.  SLP Visit Diagnosis: Dysphagia, unspecified (R13.10)    Aspiration Risk  No limitations    Diet Recommendation Regular;Thin liquid   Liquid Administration via: Straw;Cup Medication Administration: Whole meds with liquid Supervision: Patient able to self feed Postural Changes: Seated upright at 90 degrees    Other  Recommendations Oral Care Recommendations: Oral care BID   Follow up Recommendations None      Frequency and Duration   N/A         Prognosis   N/A     Swallow Study   General Date of Onset: 08/19/18 HPI: Patient is a 77 y.o. male with PMH: systolic and diastolic heart failure, hyperlipidemia, BPH and recently treated for C. difficile colitis,  presented to the emergency department on 8/21 complaining of black tarry stools and hypotension. He was found to be hyopoxic, anemic with hemoglobin of 6/7. Patient underwent EGD which showed large duodenal bulb ulcer with  visable vessel and was treated with epi injection and gold probe cautery. CTA of chest on 9/4 revealed acute PE and patient was place on heparin drip. Patient became hypotensive on 9/7 and had a massive GI bleed. Patient was take off BiPap on 9/13 and is currently on O2 via nasal cannula. Type of Study: Bedside Swallow Evaluation Previous Swallow Assessment: 07/30/18: swallow WFL, recommended Regular, Thin liquids Diet Prior to this Study: Thin liquids;Dysphagia 3 (soft) Temperature Spikes Noted: No Respiratory Status: Nasal cannula History of Recent Intubation: No Behavior/Cognition: Alert;Cooperative Oral Cavity Assessment: Within Functional Limits Oral Care Completed by SLP: No Oral Cavity - Dentition: Adequate natural dentition Vision: Functional for self-feeding Self-Feeding Abilities: Able to feed self Patient Positioning: Upright in bed Baseline Vocal Quality: Normal;Low vocal intensity Volitional Cough: Strong Volitional Swallow: Able to elicit    Oral/Motor/Sensory Function Overall Oral Motor/Sensory Function: Within functional limits   Ice Chips Ice chips: Not tested   Thin  Liquid Thin Liquid: Within functional limits Presentation: Straw;Self Fed Other Comments: No overt s/s of aspiration or penetration.    Nectar Thick     Honey Thick     Puree Puree: Not tested   Solid     Solid: Not tested Other Comments: Patient declined. RN reported that patient has been tolerating a Dys 3, thin liquid diet without difficulty.      Sonia Baller, MA, CCC-SLP 09/12/18 5:14 PM

## 2018-09-13 LAB — BASIC METABOLIC PANEL
ANION GAP: 6 (ref 5–15)
BUN: 14 mg/dL (ref 8–23)
CALCIUM: 8 mg/dL — AB (ref 8.9–10.3)
CO2: 33 mmol/L — ABNORMAL HIGH (ref 22–32)
Chloride: 107 mmol/L (ref 98–111)
Creatinine, Ser: 0.8 mg/dL (ref 0.61–1.24)
GFR calc Af Amer: >60 mL/min (ref >60)
Glucose, Bld: 107 mg/dL — ABNORMAL HIGH (ref 70–99)
POTASSIUM: 3.8 mmol/L (ref 3.5–5.1)
Sodium: 146 mmol/L — ABNORMAL HIGH (ref 135–145)

## 2018-09-13 LAB — CBC
HCT: 27.8 % — ABNORMAL LOW (ref 39.0–52.0)
HEMATOCRIT: 28.8 % — AB (ref 39.0–52.0)
HEMOGLOBIN: 9 g/dL — AB (ref 13.0–17.0)
Hemoglobin: 8.6 g/dL — ABNORMAL LOW (ref 13.0–17.0)
MCH: 29.6 pg (ref 26.0–34.0)
MCH: 29.5 pg (ref 26.0–34.0)
MCHC: 31.3 g/dL (ref 30.0–36.0)
MCHC: 30.9 g/dL (ref 30.0–36.0)
MCV: 94.7 fL (ref 78.0–100.0)
MCV: 95.2 fL (ref 78.0–100.0)
Platelets: 187 10*3/uL (ref 150–400)
Platelets: 177 10*3/uL (ref 150–400)
RBC: 3.04 MIL/uL — ABNORMAL LOW (ref 4.22–5.81)
RBC: 2.92 MIL/uL — ABNORMAL LOW (ref 4.22–5.81)
RDW: 15.3 % (ref 11.5–15.5)
RDW: 15.2 % (ref 11.5–15.5)
WBC: 11 10*3/uL — AB (ref 4.0–10.5)
WBC: 8.6 10*3/uL (ref 4.0–10.5)

## 2018-09-13 LAB — GLUCOSE, CAPILLARY
GLUCOSE-CAPILLARY: 117 mg/dL — AB (ref 70–99)
GLUCOSE-CAPILLARY: 193 mg/dL — AB (ref 70–99)
Glucose-Capillary: 191 mg/dL — ABNORMAL HIGH (ref 70–99)
Glucose-Capillary: 82 mg/dL (ref 70–99)
Glucose-Capillary: 82 mg/dL (ref 70–99)
Glucose-Capillary: 99 mg/dL (ref 70–99)

## 2018-09-13 LAB — COOXEMETRY PANEL
CARBOXYHEMOGLOBIN: 1.1 % (ref 0.5–1.5)
METHEMOGLOBIN: 0.7 % (ref 0.0–1.5)
O2 Saturation: 71.4 %
Total hemoglobin: 9.1 g/dL — ABNORMAL LOW (ref 12.0–16.0)

## 2018-09-13 MED ORDER — SODIUM CHLORIDE 0.9 % IV BOLUS
500.0000 mL | Freq: Once | INTRAVENOUS | Status: DC
  Administered 2018-09-13: 500 mL via INTRAVENOUS

## 2018-09-13 MED ORDER — SODIUM CHLORIDE 0.9 % IV BOLUS
500.0000 mL | Freq: Once | INTRAVENOUS | Status: AC
  Administered 2018-09-13: 500 mL via INTRAVENOUS

## 2018-09-13 MED ORDER — ALBUMIN HUMAN 25 % IV SOLN
25.0000 g | Freq: Once | INTRAVENOUS | Status: AC
  Administered 2018-09-13: 12.5 g via INTRAVENOUS

## 2018-09-13 MED ORDER — PHENYLEPHRINE HCL-NACL 10-0.9 MG/250ML-% IV SOLN
0.0000 ug/min | INTRAVENOUS | Status: DC
  Administered 2018-09-13: 20 ug/min via INTRAVENOUS
  Administered 2018-09-14: 100 ug/min via INTRAVENOUS
  Filled 2018-09-13 (×2): qty 250

## 2018-09-13 MED ORDER — ALBUMIN HUMAN 25 % IV SOLN
12.5000 g | Freq: Once | INTRAVENOUS | Status: AC
  Administered 2018-09-13: 12.5 g via INTRAVENOUS
  Filled 2018-09-13: qty 50

## 2018-09-13 MED ORDER — SODIUM CHLORIDE 0.9 % IV BOLUS: 500.0000 mL | Freq: Once | INTRAVENOUS | Status: DC

## 2018-09-13 MED ORDER — DEXTROSE 5 % IV SOLN
INTRAVENOUS | Status: DC
  Administered 2018-09-14 – 2018-09-18 (×4): via INTRAVENOUS

## 2018-09-13 MED ORDER — FUROSEMIDE 10 MG/ML IJ SOLN
20.0000 mg | Freq: Three times a day (TID) | INTRAMUSCULAR | Status: DC
  Administered 2018-09-13 – 2018-09-16 (×7): 20 mg via INTRAVENOUS
  Filled 2018-09-13 (×6): qty 2

## 2018-09-13 MED ORDER — FUROSEMIDE 10 MG/ML IJ SOLN: 40.0000 mg | Freq: Three times a day (TID) | INTRAMUSCULAR | Status: DC

## 2018-09-13 NOTE — Progress Notes (Signed)
Shift event note:  Notified by RN just before 0200 regarding persistent hypotension. Record indicates pt has been persistently hypotensive since approx 1400 yesterday (09/12/2028). Pt transferred back to SDU from floor at approx 1130 yesterday after he became hypoxic and hypercarbic (d/t increasing pulmonary edema) requiring BiPAP. He was given IV lasix and ultimately diuresed 2.9 L UOP. BP's and HR began to trend down around approx 1400. Pt w/ h/o decompensated CHF w/ EF 35-40%. RN currently reports MAP of 53 that had minimal response to a 500 cc NS IVFB ordered to infuse over one hour. Pt has continued to have good UOP and is stable on BiPAP (sitting up watching TV per RN) w/ 02 sats of 100% on BiPAP. He remains afebrile.  Assessment/Plan: 1. Persistent Hypotension: Despite small IVFB. Discussed w/ Dr Jimmy Footman w/ [facility] regarding possible need for pressors. She recommended IV Albumin first given pt's relative stability at this time. RN notified of plan and was ask to f/u w/ this NP after IV Albumin completed. Will continue to monitor closely in SDU.  Jeryl Columbia, NP-C [facility] Pager [phone]

## 2018-09-13 NOTE — Progress Notes (Signed)
Manual BP taken due to low BP readings (on wrist). BP cuff repositioned to upper arm.  MD made aware. New orders received for 500 mL NS bolus over 1 hour. Will carry out orders. Will continue to monitor.

## 2018-09-13 NOTE — Progress Notes (Signed)
Paged regarding pts persistent hypotension with a current SBP in the 60's. Pt had previously received a 548ml NS Bolus with no improvement. Pt on Bipap with crackles bilaterally and CXR revealing pulmonary congestion. Pt lethargic but alert. Ordered 25gms of Albumin IV with no improvement in BP after being given. Consulted with PCCM who recommended starting Phenylephrine and to start CVP monitoring. If CVP is less than 8 we will begin gentle IV hydration.  We will continue to monitor.  Arby Barrette AGPCNP-BC, AGNP-C [facility] Pager [phone]

## 2018-09-13 NOTE — Progress Notes (Signed)
[facility]                                                                              Patient Demographics  [name], is a 77 y.o. male, DOB - 30-May-1941, IRJ:[id number]  Admit date - 08/19/2018   Admitting Physician Laurin Coder, MD  Outpatient Primary MD for the patient is Belmont Bing, DO  Outpatient specialists:   LOS - 25  days   Medical records reviewed and are as summarized below:    Chief Complaint  Patient presents with  . Rectal Bleeding       Brief summary   [name] 77 y/o M with PMHx of 77 year old male with past medical history significant for chronic systolic and diastolic heart failure, hyperlipidemia, BPH and recently treated for C. difficile colitis presented to the emergency department on 8/21 complaining of black tarry stools and hypotension.  Patient was subsequently found to be hypoxic, anemic with hemoglobin of 6.7.  Patient was transfused and scheduled for outpatient GI work-up however on 8/24 patient declined euvolemic shock and was transferred to the ICU on 08/27/2018, patient underwent EGD which showed large duodenal bulb ulcer with visible vessel and was treated with epi injection and gold probe cautery.  Patient remained hypoxic, 9/4 CTA of the chest revealed Acute left PE.  Patient was started on heparin drip, case was discussed with GI who recommended 48 hours of heparin and if no bleeding switch to oral anticoagulation. eliquis started on 9/6. On 9/7 patient developed hematemesis, became hypotensive and was transferred to the ICU for pressors, found to have massive GI bleed.  Anticoagulation was reversed, he was transfused 4 more units of PRBC's and was embolized by IR. 9/9 pressors were d/c and patient remained stable. Patient transferred to [facility] for further management    Assessment & Plan   Principal problem Acute on chronic hypercarbic respiratory failure Likely due to acute on chronic systolic CHF, OSA, acute  PE -Patient was transferred back to stepdown on 9/13 for hypoxia and hypercarbia, placed on BiPAP -Chest x-ray showed vascular congestion/pulmonary edema and was started on IV Lasix -I's and O's with positive balance of 9.4 L -DC IV fluids, increase Lasix to 20 mg every 8 hours, BP borderline and may not tolerate higher dose of Lasix at this time -Strict I's and O's and daily weights, follow creatinine function -Continue BiPAP as needed per respiratory -DC Coreg.  Once BP stable, will increase Lasix.  Weight elevated from 319 yesterday-> 328 today  Active problems Upper GI bleed, massive -Received total of 12 units packed RBC during the hospital stay -EGD 8/29 showed bleeding vessel at the duodenal bulb ulcer, treated with epi injection, cauterization.  However had rebleeding after started on Eliquis and underwent embolization by IR on 9/7 -Follow-up EGD on 9/10 showed no signs of further bleeding -Follow H&H, continue Carafate, PPI -On soft diet  Acute left PE -CTA 9/4 showed pulmonary emboli in the segmental branches of the left upper lobe -Doppler ultrasound of the lower extremities negative for DVT -Initially treated with heparin drip and subsequently switched to Eliquis.  However patient developed  massive GI bleed, not a candidate for anticoagulation -IR consulted for evaluation for IVC filter  Hypernatremia -will continue to improve with Lasix, follow closely, trending down   Recent PEA arrest on 07/31/2018 -During the previous admit for sepsis secondary to pneumonia and UTI -Patient was discharged to skilled nursing facility  Recent diagnosis of C. Difficile -Patient was diagnosed with C. difficile on 8/7, has received oral vancomycin course through 8/29  Goals of care -Palliative medicine has been consulted for goals of care   Code Status: Partial code DVT Prophylaxis:  SCD's Family Communication: Discussed in detail with the patient, all imaging results, lab results  explained to the patient, no family member at the bedside  Disposition Plan: Needs palliative goals of care discussion prior to disposition  Time Spent in minutes 35 minutes  Procedures:  EGD  Consultants:   PCCM Gastroenterology IR  Antimicrobials:      Medications  Scheduled Meds: . carvedilol  25 mg Oral BID WC  . chlorhexidine  15 mL Mouth Rinse BID  . Chlorhexidine Gluconate Cloth  6 each Topical Q0600  . feeding supplement (ENSURE ENLIVE)  237 mL Oral BID BM  . furosemide  20 mg Intravenous Q12H  . insulin aspart  0-20 Units Subcutaneous Q4H  . latanoprost  1 drop Both Eyes QHS  . mouth rinse  15 mL Mouth Rinse q12n4p  . multivitamin with minerals  1 tablet Oral Daily  . pantoprazole  40 mg Oral BID AC  . potassium chloride  40 mEq Oral BID  . sodium chloride flush  10-40 mL Intracatheter Q12H  . sucralfate  1 g Oral TID WC & HS   Continuous Infusions: . dextrose 50 mL/hr at 09/12/18 2010   PRN Meds:.hydrALAZINE, iohexol, ondansetron (ZOFRAN) IV, sodium chloride flush   Antibiotics   Anti-infectives (From admission, onward)   Start     Dose/Rate Route Frequency Ordered Stop   08/22/18 0000  vancomycin (VANCOCIN) 50 mg/mL oral solution     125 mg Oral 4 times daily 08/22/18 1323     08/19/18 1930  vancomycin (VANCOCIN) 50 mg/mL oral solution 125 mg     125 mg Oral 4 times daily 08/19/18 1812 08/29/18 1759        Subjective:   [name] was seen and examined today.  Currently on BiPAP, difficult to obtain review of systems from the patient.  Overnight had hypotension and shortness of breath.  No fevers this morning.  Objective:   Vitals:   09/13/18 0400 09/13/18 0600 09/13/18 0800 09/13/18 0831  BP: (!) 83/43 (!) 91/36 (!) 108/31   Pulse: (!) 48 (!) 48 (!) 48   Resp: 15 17 14    Temp: (!) 97.4 F (36.3 C)  (!) 97.5 F (36.4 C)   TempSrc: Axillary  Axillary   SpO2: 100% 100% 100% 100%  Weight:      Height:        Intake/Output Summary  (Last 24 hours) at 09/13/2018 1048 Last data filed at 09/13/2018 0357 Gross per 24 hour  Intake 860 ml  Output 1100 ml  Net -240 ml     Wt Readings from Last 3 Encounters:  09/13/18 (!) 149 kg  07/30/18 (!) 150.5 kg  06/06/18 (!) 143.5 kg     Exam  General: Alert and awake, on BiPAP  Eyes:   HEENT:    Cardiovascular: S1 S2 auscultated,  Regular rate and rhythm.  Respiratory: Bibasilar crackles  Gastrointestinal: Soft, nontender, nondistended, + bowel sounds  Ext: no pedal edema bilaterally  Neuro: bilateral lower extremity Unna boots+  Musculoskeletal: No digital cyanosis, clubbing  Skin: No rashes  Psych: alert and awake, on BiPAP   Data Reviewed:  I have personally reviewed following labs and imaging studies  Micro Results No results found for this or any previous visit (from the past 240 hour(s)).  Radiology Reports Dg Chest 1 View  Result Date: 09/01/2018 CLINICAL DATA:  Shortness of breath. Diabetes and hypertension. Personal history of prostate carcinoma. EXAM: CHEST  1 VIEW COMPARISON:  08/29/2018 FINDINGS: Right arm PICC line remains in appropriate position. Heart size is stable. Mild elevation of left hemidiaphragm again noted. There is persistent atelectasis or infiltrate in the left lung base. Mild increase in atelectasis noted in right lung base. No pneumothorax visualized. IMPRESSION: Left lower lobe atelectasis versus infiltrate, without significant change. Increased mild right basilar atelectasis. Electronically Signed   By: Earle Gell M.D.   On: 09/01/2018 08:40   Dg Abd 1 View  Result Date: 09/08/2018 CLINICAL DATA:  NG placement EXAM: ABDOMEN - 1 VIEW COMPARISON:  09/08/2018 FINDINGS: NG has been removed from the right lower lobe and now is in the stomach. NG tip in the gastric fundus. Bibasilar atelectasis/infiltrate.  Small left effusion. Normal bowel gas pattern. Embolization coils in the region of the right gastroduodenal artery. IMPRESSION: NG  tube in the gastric fundus.  Normal bowel gas pattern. Electronically Signed   By: Franchot Gallo M.D.   On: 09/08/2018 09:30   Dg Abd 1 View  Result Date: 09/08/2018 CLINICAL DATA:  NG tube placement. EXAM: ABDOMEN - 1 VIEW COMPARISON:  Radiographs yesterday. FINDINGS: An enteric tube courses over the right lower chest with tip in the region of the right lower lobe bronchus in side-port in the region of the right mainstem bronchus. Recommend removal and replacement. Previous gastric tube in the stomach is no longer seen. IMPRESSION: Enteric tube projecting over the right lower chest suspicious for bronchial placement. Recommend removal and replacement. These results will be called to the ordering clinician or representative by the Radiologist Assistant, and communication documented in the PACS or zVision Dashboard. Electronically Signed   By: Keith Rake M.D.   On: 09/08/2018 04:12   Dg Abd 1 View  Result Date: 09/07/2018 CLINICAL DATA:  NG tube placement. EXAM: ABDOMEN - 1 VIEW COMPARISON:  09/05/2018 FINDINGS: Nonobstructive bowel gas pattern. Enteric catheter tip overlies the expected location of the gastric antrum. Embolization coils noted in the right upper quadrant of the abdomen. IMPRESSION: Enteric catheter tip at the expected location of the gastric antrum. Electronically Signed   By: Fidela Salisbury M.D.   On: 09/07/2018 09:30   Dg Abd 1 View  Result Date: 09/05/2018 CLINICAL DATA:  Enteric tube placement. EXAM: ABDOMEN - 1 VIEW COMPARISON:  Abdominal x-ray dated August 30, 2018. FINDINGS: Enteric tube tip in the region of the proximal duodenum. Nonobstructive bowel gas pattern. Interval coil embolization of the gastroduodenal artery. No radiopaque calculi or other significant abnormality. No acute osseous abnormality. Unchanged left basilar airspace disease and small left pleural effusion. IMPRESSION: 1. Enteric tube tip in the region of the proximal duodenum. 2. Unchanged left  basilar airspace disease and small left pleural effusion. Electronically Signed   By: Titus Dubin M.D.   On: 09/05/2018 09:25   Dg Abd 1 View  Result Date: 08/30/2018 CLINICAL DATA:  Pt c/o abdominal pain. Duodenal ulcer. H/o Diabetes type 2, prostate cancer, hiatal hernia. EXAM: ABDOMEN - 1  VIEW COMPARISON:  07/31/2018 FINDINGS: There is elevation of LEFT hemidiaphragm. There is gaseous distension of the stomach. There is increased opacity at the LEFT lung base, consistent with atelectasis or consolidation and pleural effusion. No bowel obstruction. Remote posttraumatic diastases of the symphysis pubis. Bilateral hip replacements. Degenerative changes are seen in the spine. IMPRESSION: 1. Developing density at the LEFT lung base. 2. Gaseous distension of the stomach. 3. Nonobstructed bowel-gas pattern. Electronically Signed   By: Nolon Nations M.D.   On: 08/30/2018 13:36   Ct Angio Chest Pe W Or Wo Contrast  Result Date: 09/02/2018 CLINICAL DATA:  Shortness of breath.  Hypoxia. EXAM: CT ANGIOGRAPHY CHEST WITH CONTRAST TECHNIQUE: Multidetector CT imaging of the chest was performed using the standard protocol during bolus administration of intravenous contrast. Multiplanar CT image reconstructions and MIPs were obtained to evaluate the vascular anatomy. CONTRAST:  156mL ISOVUE-370 IOPAMIDOL (ISOVUE-370) INJECTION 76% COMPARISON:  None. FINDINGS: Cardiovascular: Satisfactory opacification of the pulmonary arteries to the segmental level. Pulmonary emboli in the segmental branches of the left upper lobe. Normal heart size. No pericardial effusion. Ascending thoracic aorta measures 4.1 cm in transverse diameter. Mediastinum/Nodes: No enlarged mediastinal, hilar, or axillary lymph nodes. Thyroid gland, trachea, and esophagus demonstrate no significant findings. Lungs/Pleura: Trace left pleural effusion. Left basilar atelectasis. Mild right basilar atelectasis. No pneumothorax. Upper Abdomen: No acute upper  abdominal abnormality. Musculoskeletal: No acute osseous abnormality. No aggressive osseous lesion. Review of the MIP images confirms the above findings. IMPRESSION: 1. Pulmonary emboli in the segmental branches of the left upper lobe. 2. Bibasilar atelectasis, left greater than right. Critical Value/emergent results were called by telephone at the time of interpretation on 09/02/2018 at 12:21 pm to Dr. Doreatha Lew , who verbally acknowledged these results. Electronically Signed   By: Kathreen Devoid   On: 09/02/2018 12:27   Ir Angiogram Visceral Selective  Result Date: 09/05/2018 INDICATION: 77 year old male with a history of acute, life-threatening recurrent upper GI bleeding. EXAM: ULTRASOUND GUIDED ACCESS RIGHT COMMON FEMORAL ARTERY MESENTERIC ANGIOGRAM COIL EMBOLIZATION GASTRODUODENAL ARTERY DEPLOYMENT OF EXOSEAL FOR HEMOSTASIS MEDICATIONS: None ANESTHESIA/SEDATION: Moderate (conscious) sedation was employed during this procedure. A total of Versed 1.0 mg and Fentanyl 50 mcg was administered intravenously. Moderate Sedation Time: 30 minutes. The patient's level of consciousness and vital signs were monitored continuously by radiology nursing throughout the procedure under my direct supervision. CONTRAST:  3mL OMNIPAQUE IOHEXOL 300 MG/ML SOLN, 61mL OMNIPAQUE IOHEXOL 300 MG/ML SOLN FLUOROSCOPY TIME:  Fluoroscopy Time: 8 minutes 6 seconds (1,192 mGy). COMPLICATIONS: None PROCEDURE: Informed consent was obtained from the patient following explanation of the procedure, risks, benefits and alternatives. The patient understands, agrees and consents for the procedure. All questions were addressed. A time out was performed prior to the initiation of the procedure. Maximal barrier sterile technique utilized including caps, mask, sterile gowns, sterile gloves, large sterile drape, hand hygiene, and Betadine prep. Ultrasound survey of the right inguinal region was performed with images stored and sent to PACs,  confirming patency of the vessel. A micropuncture needle was used access the right common femoral artery under ultrasound. With excellent arterial blood flow returned, and an .018 micro wire was passed through the needle, observed enter the abdominal aorta under fluoroscopy. The needle was removed, and a micropuncture sheath was placed over the wire. The inner dilator and wire were removed, and an 035 Bentson wire was advanced under fluoroscopy into the abdominal aorta. The sheath was removed and a standard 5 [place] vascular sheath was  placed. The dilator was removed and the sheath was flushed. Cobra catheter was advanced over the Bentson wire into the abdominal aorta. Catheter was used to select the celiac artery. Angiogram was performed. Catheter was withdrawn to the superior mesenteric artery. Angiogram was performed. Catheter was then used to select the celiac artery and a Glidewire was used to place the base catheter into the common hepatic artery, just at the origin of the gastroduodenal artery. Angiogram was performed. Microcatheter was then advanced into the gastroduodenal artery. Angiogram was performed. Empiric coil embolization was then performed within the gastroduodenal artery, extending to the origin of the GDA. Repeat angiogram was performed. Catheters and wires were removed, and an Exoseal was deployed at the right common femoral artery access for hemostasis. Patient tolerated the procedure well. Hemodynamics were unchanged during the course of the procedure, as the patient remained on pressor support. No complications encountered. No significant blood loss. IMPRESSION: Status post ultrasound guided access right common femoral artery for mesenteric angiogram and empiric coil embolization of the gastroduodenal artery for acute life-threatening upper GI hemorrhage. Signed, Dulcy Fanny. Dellia Nims, RPVI [facility] Radiology Electronically Signed   By: Corrie Mckusick D.O.   On: 09/05/2018 08:13   Ir Angiogram Visceral Selective  Result Date: 09/05/2018 INDICATION: 77 year old male with a history of acute, life-threatening recurrent upper GI bleeding. EXAM: ULTRASOUND GUIDED ACCESS RIGHT COMMON FEMORAL ARTERY MESENTERIC ANGIOGRAM COIL EMBOLIZATION GASTRODUODENAL ARTERY DEPLOYMENT OF EXOSEAL FOR HEMOSTASIS MEDICATIONS: None ANESTHESIA/SEDATION: Moderate (conscious) sedation was employed during this procedure. A total of Versed 1.0 mg and Fentanyl 50 mcg was administered intravenously. Moderate Sedation Time: 30 minutes. The patient's level of consciousness and vital signs were monitored continuously by radiology nursing throughout the procedure under my direct supervision. CONTRAST:  44mL OMNIPAQUE IOHEXOL 300 MG/ML SOLN, 9mL OMNIPAQUE IOHEXOL 300 MG/ML SOLN FLUOROSCOPY TIME:  Fluoroscopy Time: 8 minutes 6 seconds (1,192 mGy). COMPLICATIONS: None PROCEDURE: Informed consent was obtained from the patient following explanation of the procedure, risks, benefits and alternatives. The patient understands, agrees and consents for the procedure. All questions were addressed. A time out was performed prior to the initiation of the procedure. Maximal barrier sterile technique utilized including caps, mask, sterile gowns, sterile gloves, large sterile drape, hand hygiene, and Betadine prep. Ultrasound survey of the right inguinal region was performed with images stored and sent to PACs, confirming patency of the vessel. A micropuncture needle was used access the right common femoral artery under ultrasound. With excellent arterial blood flow returned, and an .018 micro wire was passed through the needle, observed enter the abdominal aorta under fluoroscopy. The needle was removed, and a micropuncture sheath was placed over the wire. The inner dilator and wire were removed, and an 035 Bentson wire was advanced under fluoroscopy into the abdominal aorta. The sheath was removed and  a standard 5 [place] vascular sheath was placed. The dilator was removed and the sheath was flushed. Cobra catheter was advanced over the Bentson wire into the abdominal aorta. Catheter was used to select the celiac artery. Angiogram was performed. Catheter was withdrawn to the superior mesenteric artery. Angiogram was performed. Catheter was then used to select the celiac artery and a Glidewire was used to place the base catheter into the common hepatic artery, just at the origin of the gastroduodenal artery. Angiogram was performed. Microcatheter was then advanced into the gastroduodenal artery. Angiogram was performed. Empiric coil embolization was then performed within the gastroduodenal artery, extending to the  origin of the GDA. Repeat angiogram was performed. Catheters and wires were removed, and an Exoseal was deployed at the right common femoral artery access for hemostasis. Patient tolerated the procedure well. Hemodynamics were unchanged during the course of the procedure, as the patient remained on pressor support. No complications encountered. No significant blood loss. IMPRESSION: Status post ultrasound guided access right common femoral artery for mesenteric angiogram and empiric coil embolization of the gastroduodenal artery for acute life-threatening upper GI hemorrhage. Signed, Dulcy Fanny. Dellia Nims, RPVI [facility] Radiology Electronically Signed   By: Corrie Mckusick D.O.   On: 09/05/2018 08:13   Ir Angiogram Selective Each Additional Vessel  Result Date: 09/05/2018 INDICATION: 77 year old male with a history of acute, life-threatening recurrent upper GI bleeding. EXAM: ULTRASOUND GUIDED ACCESS RIGHT COMMON FEMORAL ARTERY MESENTERIC ANGIOGRAM COIL EMBOLIZATION GASTRODUODENAL ARTERY DEPLOYMENT OF EXOSEAL FOR HEMOSTASIS MEDICATIONS: None ANESTHESIA/SEDATION: Moderate (conscious) sedation was employed during this procedure. A total of Versed 1.0 mg and  Fentanyl 50 mcg was administered intravenously. Moderate Sedation Time: 30 minutes. The patient's level of consciousness and vital signs were monitored continuously by radiology nursing throughout the procedure under my direct supervision. CONTRAST:  67mL OMNIPAQUE IOHEXOL 300 MG/ML SOLN, 54mL OMNIPAQUE IOHEXOL 300 MG/ML SOLN FLUOROSCOPY TIME:  Fluoroscopy Time: 8 minutes 6 seconds (1,192 mGy). COMPLICATIONS: None PROCEDURE: Informed consent was obtained from the patient following explanation of the procedure, risks, benefits and alternatives. The patient understands, agrees and consents for the procedure. All questions were addressed. A time out was performed prior to the initiation of the procedure. Maximal barrier sterile technique utilized including caps, mask, sterile gowns, sterile gloves, large sterile drape, hand hygiene, and Betadine prep. Ultrasound survey of the right inguinal region was performed with images stored and sent to PACs, confirming patency of the vessel. A micropuncture needle was used access the right common femoral artery under ultrasound. With excellent arterial blood flow returned, and an .018 micro wire was passed through the needle, observed enter the abdominal aorta under fluoroscopy. The needle was removed, and a micropuncture sheath was placed over the wire. The inner dilator and wire were removed, and an 035 Bentson wire was advanced under fluoroscopy into the abdominal aorta. The sheath was removed and a standard 5 [place] vascular sheath was placed. The dilator was removed and the sheath was flushed. Cobra catheter was advanced over the Bentson wire into the abdominal aorta. Catheter was used to select the celiac artery. Angiogram was performed. Catheter was withdrawn to the superior mesenteric artery. Angiogram was performed. Catheter was then used to select the celiac artery and a Glidewire was used to place the base catheter into the common hepatic artery, just at the origin of  the gastroduodenal artery. Angiogram was performed. Microcatheter was then advanced into the gastroduodenal artery. Angiogram was performed. Empiric coil embolization was then performed within the gastroduodenal artery, extending to the origin of the GDA. Repeat angiogram was performed. Catheters and wires were removed, and an Exoseal was deployed at the right common femoral artery access for hemostasis. Patient tolerated the procedure well. Hemodynamics were unchanged during the course of the procedure, as the patient remained on pressor support. No complications encountered. No significant blood loss. IMPRESSION: Status post ultrasound guided access right common femoral artery for mesenteric angiogram and empiric coil embolization of the gastroduodenal artery for acute life-threatening upper GI hemorrhage. Signed, Dulcy Fanny. Dellia Nims, [place] [facility] Radiology Electronically Signed   By: York Cerise  Earleen Newport D.O.   On: 09/05/2018 08:13   Ir Angiogram Selective Each Additional Vessel  Result Date: 09/05/2018 INDICATION: 77 year old male with a history of acute, life-threatening recurrent upper GI bleeding. EXAM: ULTRASOUND GUIDED ACCESS RIGHT COMMON FEMORAL ARTERY MESENTERIC ANGIOGRAM COIL EMBOLIZATION GASTRODUODENAL ARTERY DEPLOYMENT OF EXOSEAL FOR HEMOSTASIS MEDICATIONS: None ANESTHESIA/SEDATION: Moderate (conscious) sedation was employed during this procedure. A total of Versed 1.0 mg and Fentanyl 50 mcg was administered intravenously. Moderate Sedation Time: 30 minutes. The patient's level of consciousness and vital signs were monitored continuously by radiology nursing throughout the procedure under my direct supervision. CONTRAST:  42mL OMNIPAQUE IOHEXOL 300 MG/ML SOLN, 66mL OMNIPAQUE IOHEXOL 300 MG/ML SOLN FLUOROSCOPY TIME:  Fluoroscopy Time: 8 minutes 6 seconds (1,192 mGy). COMPLICATIONS: None PROCEDURE: Informed consent was obtained from the patient following  explanation of the procedure, risks, benefits and alternatives. The patient understands, agrees and consents for the procedure. All questions were addressed. A time out was performed prior to the initiation of the procedure. Maximal barrier sterile technique utilized including caps, mask, sterile gowns, sterile gloves, large sterile drape, hand hygiene, and Betadine prep. Ultrasound survey of the right inguinal region was performed with images stored and sent to PACs, confirming patency of the vessel. A micropuncture needle was used access the right common femoral artery under ultrasound. With excellent arterial blood flow returned, and an .018 micro wire was passed through the needle, observed enter the abdominal aorta under fluoroscopy. The needle was removed, and a micropuncture sheath was placed over the wire. The inner dilator and wire were removed, and an 035 Bentson wire was advanced under fluoroscopy into the abdominal aorta. The sheath was removed and a standard 5 [place] vascular sheath was placed. The dilator was removed and the sheath was flushed. Cobra catheter was advanced over the Bentson wire into the abdominal aorta. Catheter was used to select the celiac artery. Angiogram was performed. Catheter was withdrawn to the superior mesenteric artery. Angiogram was performed. Catheter was then used to select the celiac artery and a Glidewire was used to place the base catheter into the common hepatic artery, just at the origin of the gastroduodenal artery. Angiogram was performed. Microcatheter was then advanced into the gastroduodenal artery. Angiogram was performed. Empiric coil embolization was then performed within the gastroduodenal artery, extending to the origin of the GDA. Repeat angiogram was performed. Catheters and wires were removed, and an Exoseal was deployed at the right common femoral artery access for hemostasis. Patient tolerated the procedure well. Hemodynamics were unchanged during the  course of the procedure, as the patient remained on pressor support. No complications encountered. No significant blood loss. IMPRESSION: Status post ultrasound guided access right common femoral artery for mesenteric angiogram and empiric coil embolization of the gastroduodenal artery for acute life-threatening upper GI hemorrhage. Signed, Dulcy Fanny. Dellia Nims, RPVI [facility] Radiology Electronically Signed   By: Corrie Mckusick D.O.   On: 09/05/2018 08:13   Ir Angiogram Selective Each Additional Vessel  Result Date: 09/05/2018 INDICATION: 77 year old male with a history of acute, life-threatening recurrent upper GI bleeding. EXAM: ULTRASOUND GUIDED ACCESS RIGHT COMMON FEMORAL ARTERY MESENTERIC ANGIOGRAM COIL EMBOLIZATION GASTRODUODENAL ARTERY DEPLOYMENT OF EXOSEAL FOR HEMOSTASIS MEDICATIONS: None ANESTHESIA/SEDATION: Moderate (conscious) sedation was employed during this procedure. A total of Versed 1.0 mg and Fentanyl 50 mcg was administered intravenously. Moderate Sedation Time: 30 minutes. The patient's level of consciousness and vital signs were monitored continuously by radiology nursing throughout the procedure under my direct supervision.  CONTRAST:  64mL OMNIPAQUE IOHEXOL 300 MG/ML SOLN, 23mL OMNIPAQUE IOHEXOL 300 MG/ML SOLN FLUOROSCOPY TIME:  Fluoroscopy Time: 8 minutes 6 seconds (1,192 mGy). COMPLICATIONS: None PROCEDURE: Informed consent was obtained from the patient following explanation of the procedure, risks, benefits and alternatives. The patient understands, agrees and consents for the procedure. All questions were addressed. A time out was performed prior to the initiation of the procedure. Maximal barrier sterile technique utilized including caps, mask, sterile gowns, sterile gloves, large sterile drape, hand hygiene, and Betadine prep. Ultrasound survey of the right inguinal region was performed with images stored and sent to PACs, confirming  patency of the vessel. A micropuncture needle was used access the right common femoral artery under ultrasound. With excellent arterial blood flow returned, and an .018 micro wire was passed through the needle, observed enter the abdominal aorta under fluoroscopy. The needle was removed, and a micropuncture sheath was placed over the wire. The inner dilator and wire were removed, and an 035 Bentson wire was advanced under fluoroscopy into the abdominal aorta. The sheath was removed and a standard 5 [place] vascular sheath was placed. The dilator was removed and the sheath was flushed. Cobra catheter was advanced over the Bentson wire into the abdominal aorta. Catheter was used to select the celiac artery. Angiogram was performed. Catheter was withdrawn to the superior mesenteric artery. Angiogram was performed. Catheter was then used to select the celiac artery and a Glidewire was used to place the base catheter into the common hepatic artery, just at the origin of the gastroduodenal artery. Angiogram was performed. Microcatheter was then advanced into the gastroduodenal artery. Angiogram was performed. Empiric coil embolization was then performed within the gastroduodenal artery, extending to the origin of the GDA. Repeat angiogram was performed. Catheters and wires were removed, and an Exoseal was deployed at the right common femoral artery access for hemostasis. Patient tolerated the procedure well. Hemodynamics were unchanged during the course of the procedure, as the patient remained on pressor support. No complications encountered. No significant blood loss. IMPRESSION: Status post ultrasound guided access right common femoral artery for mesenteric angiogram and empiric coil embolization of the gastroduodenal artery for acute life-threatening upper GI hemorrhage. Signed, Dulcy Fanny. Dellia Nims, RPVI Vascular and Interventional Radiology Specialists [facility] Radiology Electronically Signed   By: Corrie Mckusick  D.O.   On: 09/05/2018 08:13   Ir US Guide Vasc Access Right  Result Date: 09/05/2018 INDICATION: 77 year old male with a history of acute, life-threatening recurrent upper GI bleeding. EXAM: ULTRASOUND GUIDED ACCESS RIGHT COMMON FEMORAL ARTERY MESENTERIC ANGIOGRAM COIL EMBOLIZATION GASTRODUODENAL ARTERY DEPLOYMENT OF EXOSEAL FOR HEMOSTASIS MEDICATIONS: None ANESTHESIA/SEDATION: Moderate (conscious) sedation was employed during this procedure. A total of Versed 1.0 mg and Fentanyl 50 mcg was administered intravenously. Moderate Sedation Time: 30 minutes. The patient's level of consciousness and vital signs were monitored continuously by radiology nursing throughout the procedure under my direct supervision. CONTRAST:  64mL OMNIPAQUE IOHEXOL 300 MG/ML SOLN, 42mL OMNIPAQUE IOHEXOL 300 MG/ML SOLN FLUOROSCOPY TIME:  Fluoroscopy Time: 8 minutes 6 seconds (1,192 mGy). COMPLICATIONS: None PROCEDURE: Informed consent was obtained from the patient following explanation of the procedure, risks, benefits and alternatives. The patient understands, agrees and consents for the procedure. All questions were addressed. A time out was performed prior to the initiation of the procedure. Maximal barrier sterile technique utilized including caps, mask, sterile gowns, sterile gloves, large sterile drape, hand hygiene, and Betadine prep. Ultrasound survey of the right inguinal region was performed with images stored  and sent to PACs, confirming patency of the vessel. A micropuncture needle was used access the right common femoral artery under ultrasound. With excellent arterial blood flow returned, and an .018 micro wire was passed through the needle, observed enter the abdominal aorta under fluoroscopy. The needle was removed, and a micropuncture sheath was placed over the wire. The inner dilator and wire were removed, and an 035 Bentson wire was advanced under fluoroscopy into the abdominal aorta. The sheath was removed and a  standard 5 [place] vascular sheath was placed. The dilator was removed and the sheath was flushed. Cobra catheter was advanced over the Bentson wire into the abdominal aorta. Catheter was used to select the celiac artery. Angiogram was performed. Catheter was withdrawn to the superior mesenteric artery. Angiogram was performed. Catheter was then used to select the celiac artery and a Glidewire was used to place the base catheter into the common hepatic artery, just at the origin of the gastroduodenal artery. Angiogram was performed. Microcatheter was then advanced into the gastroduodenal artery. Angiogram was performed. Empiric coil embolization was then performed within the gastroduodenal artery, extending to the origin of the GDA. Repeat angiogram was performed. Catheters and wires were removed, and an Exoseal was deployed at the right common femoral artery access for hemostasis. Patient tolerated the procedure well. Hemodynamics were unchanged during the course of the procedure, as the patient remained on pressor support. No complications encountered. No significant blood loss. IMPRESSION: Status post ultrasound guided access right common femoral artery for mesenteric angiogram and empiric coil embolization of the gastroduodenal artery for acute life-threatening upper GI hemorrhage. Signed, Dulcy Fanny. Dellia Nims, RPVI Vascular and Interventional Radiology Specialists [facility] Radiology Electronically Signed   By: Corrie Mckusick D.O.   On: 09/05/2018 08:13   Dg Chest Port 1 View  Result Date: 09/10/2018 CLINICAL DATA:  Increasing lethargy EXAM: PORTABLE CHEST 1 VIEW COMPARISON:  09/06/2018 FINDINGS: Haziness of the bilateral chest attributed to layering pleural fluid and probable atelectasis. Pneumonia would easily be obscured. There is cardiomegaly and vascular pedicle widening accentuated by rotation. Cephalized blood flow and airway cuffing. PICC on the right with tip at the SVC. A nasogastric tube has been  removed. IMPRESSION: Stable haziness of the chest attributed to layering effusions and lung opacification. There is pulmonary vascular congestion. Electronically Signed   By: Monte Fantasia M.D.   On: 09/10/2018 12:05   Dg Chest Port 1 View  Result Date: 09/06/2018 CLINICAL DATA:  Acute respiratory failure. EXAM: PORTABLE CHEST 1 VIEW COMPARISON:  CT of the chest 09/02/2018 FINDINGS: Interval placement of enteric catheter, tip collimated off the image. Stable position of right-sided PICC line. Mildly enlarged cardiac silhouette. No evidence of pleural effusion. Bilateral lower lobe predominant streaky airspace opacities. Probable bilateral pleural effusions. Osseous structures are without acute abnormality. Soft tissues are grossly normal. IMPRESSION: Bilateral lower lobe predominant streaky airspace opacities may represent atelectasis versus airspace consolidation. Probable small bilateral pleural effusions. Electronically Signed   By: Fidela Salisbury M.D.   On: 09/06/2018 07:16   Dg Chest Port 1 View  Result Date: 08/29/2018 CLINICAL DATA:  77 year old with history of C difficile colitis, presenting with acute shortness of breath. EXAM: PORTABLE CHEST 1 VIEW COMPARISON:  08/26/2018, 08/25/2018 and earlier, including CTA chest 06/04/2018 and earlier. FINDINGS: RIGHT arm PICC tip projects at or near the cavoatrial junction. Interval RIGHT jugular central venous catheter removal. Cardiac silhouette upper normal in size to slightly enlarged, unchanged. Thoracic aorta mildly atherosclerotic, unchanged. Consolidation involving  the LEFT LOWER LOBE, unchanged since May, 2019, associated with mild elevation of the LEFT hemidiaphragm. No new pulmonary parenchymal abnormalities. Note is made of severe degenerative changes in both shoulder joints. IMPRESSION: 1. Chronic LEFT LOWER LOBE atelectasis, unchanged dating back to May, 2019, associated with mild elevation of the LEFT hemidiaphragm. 2. No new/acute  cardiopulmonary disease. 3. RIGHT arm PICC tip at or near the expected location of the cavoatrial junction. Electronically Signed   By: Evangeline Dakin M.D.   On: 08/29/2018 11:19   Dg Chest Port 1 View  Result Date: 08/26/2018 CLINICAL DATA:  Status post central line placement EXAM: PORTABLE CHEST 1 VIEW COMPARISON:  08/25/2018 FINDINGS: Cardiac shadow is stable. Right jugular central venous line is noted extending to the cavoatrial junction. No pneumothorax is seen. Mild left basilar atelectasis and small effusion are seen. No bony abnormality is noted. IMPRESSION: No pneumothorax following right jugular central line placement. Stable changes in the left base. Electronically Signed   By: Inez Catalina M.D.   On: 08/26/2018 16:19   Dg Chest Port 1 View  Result Date: 08/25/2018 CLINICAL DATA:  Shortness of breath.  Hemorrhagic shock. EXAM: PORTABLE CHEST 1 VIEW COMPARISON:  Chest x-ray of August 19, 2018 FINDINGS: The lungs are reasonably well expanded. There is stable increased density at the left lung base with mild elevation of the left hemidiaphragm. Coarse bilateral perihilar densities are present and slightly more conspicuous. Lung the lung markings are coarse in the right infrahilar region and also more conspicuous. The heart is top-normal in size. The pulmonary vascularity is normal. There is calcification in the wall of the aortic arch. There is severe degenerative change of the left shoulder with resorption of the distal aspect of the clavicle. IMPRESSION: Slight increase conspicuity of the perihilar lung markings and right infrahilar lung markings suggests subsegmental atelectasis. Stable density at the left lung base with elevation of the left hemidiaphragm. Thoracic aortic atherosclerosis. Electronically Signed   By: David  Martinique M.D.   On: 08/25/2018 09:26   Dg Chest Portable 1 View  Result Date: 08/19/2018 CLINICAL DATA:  Hypotension.  Rectal bleeding. EXAM: PORTABLE CHEST 1 VIEW  COMPARISON:  08/03/2018 and 05/19/2018 FINDINGS: Heart size and pulmonary vascularity are normal. The lungs are clear. Slight chronic elevation of the left hemidiaphragm with slight chronic atelectasis at the left lung base. No acute bone abnormality. Severe left glenohumeral joint arthritis. Findings consistent with chronic complete rotator cuff tears bilaterally. IMPRESSION: No acute abnormalities. Chronic elevation of the left hemidiaphragm with slight chronic atelectasis at the left lung base. Electronically Signed   By: Lorriane Shire M.D.   On: 08/19/2018 16:10   [place] Guide Roadmapping  Result Date: 09/05/2018 INDICATION: 76 year old male with a history of acute, life-threatening recurrent upper GI bleeding. EXAM: ULTRASOUND GUIDED ACCESS RIGHT COMMON FEMORAL ARTERY MESENTERIC ANGIOGRAM COIL EMBOLIZATION GASTRODUODENAL ARTERY DEPLOYMENT OF EXOSEAL FOR HEMOSTASIS MEDICATIONS: None ANESTHESIA/SEDATION: Moderate (conscious) sedation was employed during this procedure. A total of Versed 1.0 mg and Fentanyl 50 mcg was administered intravenously. Moderate Sedation Time: 30 minutes. The patient's level of consciousness and vital signs were monitored continuously by radiology nursing throughout the procedure under my direct supervision. CONTRAST:  22mL OMNIPAQUE IOHEXOL 300 MG/ML SOLN, 85mL OMNIPAQUE IOHEXOL 300 MG/ML SOLN FLUOROSCOPY TIME:  Fluoroscopy Time: 8 minutes 6 seconds (1,192 mGy). COMPLICATIONS: None PROCEDURE: Informed consent was obtained from the patient following explanation of the procedure, risks, benefits and alternatives. The  patient understands, agrees and consents for the procedure. All questions were addressed. A time out was performed prior to the initiation of the procedure. Maximal barrier sterile technique utilized including caps, mask, sterile gowns, sterile gloves, large sterile drape, hand hygiene, and Betadine prep. Ultrasound survey of the right  inguinal region was performed with images stored and sent to PACs, confirming patency of the vessel. A micropuncture needle was used access the right common femoral artery under ultrasound. With excellent arterial blood flow returned, and an .018 micro wire was passed through the needle, observed enter the abdominal aorta under fluoroscopy. The needle was removed, and a micropuncture sheath was placed over the wire. The inner dilator and wire were removed, and an 035 Bentson wire was advanced under fluoroscopy into the abdominal aorta. The sheath was removed and a standard 5 [place] vascular sheath was placed. The dilator was removed and the sheath was flushed. Cobra catheter was advanced over the Bentson wire into the abdominal aorta. Catheter was used to select the celiac artery. Angiogram was performed. Catheter was withdrawn to the superior mesenteric artery. Angiogram was performed. Catheter was then used to select the celiac artery and a Glidewire was used to place the base catheter into the common hepatic artery, just at the origin of the gastroduodenal artery. Angiogram was performed. Microcatheter was then advanced into the gastroduodenal artery. Angiogram was performed. Empiric coil embolization was then performed within the gastroduodenal artery, extending to the origin of the GDA. Repeat angiogram was performed. Catheters and wires were removed, and an Exoseal was deployed at the right common femoral artery access for hemostasis. Patient tolerated the procedure well. Hemodynamics were unchanged during the course of the procedure, as the patient remained on pressor support. No complications encountered. No significant blood loss. IMPRESSION: Status post ultrasound guided access right common femoral artery for mesenteric angiogram and empiric coil embolization of the gastroduodenal artery for acute life-threatening upper GI hemorrhage. Signed, Dulcy Fanny. Dellia Nims, RPVI Vascular and Interventional Radiology  Specialists [facility] Radiology Electronically Signed   By: Corrie Mckusick D.O.   On: 09/05/2018 08:13   [place] Ekg Site Rite  Result Date: 08/28/2018 If Site Rite image not attached, placement could not be confirmed due to current cardiac rhythm.  [place] Ekg Site Rite  Result Date: 08/26/2018 If Site Rite image not attached, placement could not be confirmed due to current cardiac rhythm.   Lab Data:  CBC: Recent Labs  Lab 09/07/18 1341 09/08/18 0535 09/09/18 0351 09/10/18 0431 09/11/18 0339 09/13/18 0648  WBC 11.6* 13.3* 10.8* 10.2 8.5 11.0*  NEUTROABS 9.8*  --  8.3* 8.8* 6.4  --   HGB 8.4* 9.3* 8.7* 9.5* 8.9* 9.0*  HCT 27.2* 30.5* 29.1* 31.9* 29.5* 28.8*  MCV 93.8 95.9 97.3 99.1 97.7 94.7  PLT 108* 131* 138* 169 184 426   Basic Metabolic Panel: Recent Labs  Lab 09/07/18 0500  09/08/18 0535 09/09/18 0351 09/10/18 0431 09/11/18 0339 09/12/18 0627 09/13/18 0648  NA 142   < >  --  147* 150* 150* 146* 146*  K 2.7*   < >  --  3.2* 4.5 4.2 3.3* 3.8  CL 110   < >  --  114* 114* 112* 106 107  CO2 28   < >  --  29 31 32 34* 33*  GLUCOSE 172*   < >  --  109* 142* 91 91 107*  BUN 19   < >  --  12 11 9 12  14  CREATININE 0.58*   < >  --  0.53* 0.66 0.70 0.79 0.80  CALCIUM 6.6*   < >  --  7.4* 9.0 8.5* 8.3* 8.0*  MG 1.5*  --  2.2 1.7 1.9 1.6*  --   --   PHOS 2.5  --  3.3 2.5 3.1 2.5  --   --    < > = values in this interval not displayed.   GFR: Estimated Creatinine Clearance: 114.3 mL/min (by C-G formula based on SCr of 0.8 mg/dL). Liver Function Tests: No results for input(s): AST, ALT, ALKPHOS, BILITOT, PROT, ALBUMIN in the last 168 hours. Recent Labs  Lab 09/08/18 0535  LIPASE 18   No results for input(s): AMMONIA in the last 168 hours. Coagulation Profile: No results for input(s): INR, PROTIME in the last 168 hours. Cardiac Enzymes: Recent Labs  Lab 09/08/18 0535  TROPONINI 0.26*   BNP (last 3 results) No results for input(s): PROBNP in the last 8760  hours. HbA1C: No results for input(s): HGBA1C in the last 72 hours. CBG: Recent Labs  Lab 09/12/18 1712 09/12/18 2003 09/12/18 2350 09/13/18 0354 09/13/18 0813  GLUCAP 123* 155* 79 82 99   Lipid Profile: No results for input(s): CHOL, HDL, LDLCALC, TRIG, CHOLHDL, LDLDIRECT in the last 72 hours. Thyroid Function Tests: No results for input(s): TSH, T4TOTAL, FREET4, T3FREE, THYROIDAB in the last 72 hours. Anemia Panel: No results for input(s): VITAMINB12, FOLATE, FERRITIN, TIBC, IRON, RETICCTPCT in the last 72 hours. Urine analysis:    Component Value Date/Time   COLORURINE YELLOW 08/19/2018 1538   APPEARANCEUR CLEAR 08/19/2018 1538   LABSPEC 1.014 08/19/2018 1538   PHURINE 5.0 08/19/2018 1538   GLUCOSEU NEGATIVE 08/19/2018 1538   HGBUR SMALL (A) 08/19/2018 1538   HGBUR negative 06/08/2007 1320   BILIRUBINUR NEGATIVE 08/19/2018 1538   BILIRUBINUR NEG 02/25/2012 1351   KETONESUR NEGATIVE 08/19/2018 1538   PROTEINUR NEGATIVE 08/19/2018 1538   UROBILINOGEN 1.0 11/10/2012 2115   NITRITE NEGATIVE 08/19/2018 1538   LEUKOCYTESUR NEGATIVE 08/19/2018 1538     Ashvik Grundman M.D. [facility] 09/13/2018, 10:48 AM  Pager: [phone] Between 7am to 7pm - call Pager - [phone]  After 7pm go to [email or web address] - password [email or web address]  Call night coverage person covering after 7pm

## 2018-09-13 NOTE — Progress Notes (Signed)
  Made K Schorr NP aware of the pt/s low bp and hr. Orders received for 500 cc bolus 85/40, 50, 17, on BiPAP. No improvement of bp after 500 cc bolus, 88/36, 47,100, 15. Orders received to repeat bolus. I will continue to monitor.

## 2018-09-14 ENCOUNTER — Inpatient Hospital Stay (HOSPITAL_COMMUNITY): Payer: PPO

## 2018-09-14 DIAGNOSIS — R601 Generalized edema: Secondary | ICD-10-CM

## 2018-09-14 DIAGNOSIS — M7989 Other specified soft tissue disorders: Secondary | ICD-10-CM

## 2018-09-14 DIAGNOSIS — I82621 Acute embolism and thrombosis of deep veins of right upper extremity: Secondary | ICD-10-CM

## 2018-09-14 DIAGNOSIS — Z6841 Body Mass Index (BMI) 40.0 and over, adult: Secondary | ICD-10-CM

## 2018-09-14 DIAGNOSIS — M79609 Pain in unspecified limb: Secondary | ICD-10-CM

## 2018-09-14 DIAGNOSIS — J9602 Acute respiratory failure with hypercapnia: Secondary | ICD-10-CM

## 2018-09-14 DIAGNOSIS — I5023 Acute on chronic systolic (congestive) heart failure: Secondary | ICD-10-CM

## 2018-09-14 DIAGNOSIS — R579 Shock, unspecified: Secondary | ICD-10-CM

## 2018-09-14 DIAGNOSIS — I872 Venous insufficiency (chronic) (peripheral): Secondary | ICD-10-CM

## 2018-09-14 LAB — BLOOD GAS, ARTERIAL
Acid-Base Excess: 6.5 mmol/L — ABNORMAL HIGH (ref 0.0–2.0)
BICARBONATE: 32 mmol/L — AB (ref 20.0–28.0)
DRAWN BY: 308601
Delivery systems: POSITIVE
Expiratory PAP: 6
INSPIRATORY PAP: 16
Mode: POSITIVE
O2 Content: 2 L/min
O2 SAT: 95.5 %
PATIENT TEMPERATURE: 98.2
PCO2 ART: 53.6 mmHg — AB (ref 32.0–48.0)
PO2 ART: 74 mmHg — AB (ref 83.0–108.0)
pH, Arterial: 7.392 (ref 7.350–7.450)

## 2018-09-14 LAB — COOXEMETRY PANEL
Carboxyhemoglobin: 1 % (ref 0.5–1.5)
METHEMOGLOBIN: 0.7 % (ref 0.0–1.5)
O2 Saturation: 61.6 %
TOTAL HEMOGLOBIN: 8.8 g/dL — AB (ref 12.0–16.0)

## 2018-09-14 LAB — GLUCOSE, CAPILLARY
GLUCOSE-CAPILLARY: 74 mg/dL (ref 70–99)
GLUCOSE-CAPILLARY: 110 mg/dL — AB (ref 70–99)
Glucose-Capillary: 105 mg/dL — ABNORMAL HIGH (ref 70–99)
Glucose-Capillary: 110 mg/dL — ABNORMAL HIGH (ref 70–99)
Glucose-Capillary: 160 mg/dL — ABNORMAL HIGH (ref 70–99)

## 2018-09-14 LAB — HEPATIC FUNCTION PANEL
ALBUMIN: 2 g/dL — AB (ref 3.5–5.0)
ALK PHOS: 186 U/L — AB (ref 38–126)
ALT: 34 U/L (ref 0–44)
AST: 55 U/L — ABNORMAL HIGH (ref 15–41)
BILIRUBIN TOTAL: 0.8 mg/dL (ref 0.3–1.2)
Bilirubin, Direct: 0.4 mg/dL — ABNORMAL HIGH (ref 0.0–0.2)
Indirect Bilirubin: 0.4 mg/dL (ref 0.3–0.9)
Total Protein: 5.3 g/dL — ABNORMAL LOW (ref 6.5–8.1)

## 2018-09-14 LAB — PROCALCITONIN: PROCALCITONIN: 0.27 ng/mL

## 2018-09-14 LAB — CBC
HCT: 27.4 % — ABNORMAL LOW (ref 39.0–52.0)
HEMOGLOBIN: 8.7 g/dL — AB (ref 13.0–17.0)
MCH: 30 pg (ref 26.0–34.0)
MCHC: 31.8 g/dL (ref 30.0–36.0)
MCV: 94.5 fL (ref 78.0–100.0)
PLATELETS: 193 10*3/uL (ref 150–400)
RBC: 2.9 MIL/uL — ABNORMAL LOW (ref 4.22–5.81)
RDW: 15.1 % (ref 11.5–15.5)
WBC: 9.5 10*3/uL (ref 4.0–10.5)

## 2018-09-14 LAB — BASIC METABOLIC PANEL
Anion gap: 6 (ref 5–15)
BUN: 18 mg/dL (ref 8–23)
CALCIUM: 8.1 mg/dL — AB (ref 8.9–10.3)
CHLORIDE: 105 mmol/L (ref 98–111)
CO2: 32 mmol/L (ref 22–32)
CREATININE: 1.15 mg/dL (ref 0.61–1.24)
GFR calc Af Amer: >60 mL/min (ref >60)
GFR calc non Af Amer: 59 mL/min — ABNORMAL LOW (ref >60)
GLUCOSE: 75 mg/dL (ref 70–99)
Potassium: 4.1 mmol/L (ref 3.5–5.1)
Sodium: 143 mmol/L (ref 135–145)

## 2018-09-14 LAB — TROPONIN I
TROPONIN I: 0.06 ng/mL — AB (ref <0.03)
Troponin I: 0.07 ng/mL (ref <0.03)

## 2018-09-14 LAB — CK: Total CK: 41 U/L — ABNORMAL LOW (ref 49–397)

## 2018-09-14 LAB — CORTISOL: Cortisol, Plasma: 10.8 ug/dL

## 2018-09-14 LAB — BRAIN NATRIURETIC PEPTIDE: B Natriuretic Peptide: 477.7 pg/mL — ABNORMAL HIGH (ref 0.0–100.0)

## 2018-09-14 LAB — PROTIME-INR
INR: 1.17
Prothrombin Time: 14.8 seconds (ref 11.4–15.2)

## 2018-09-14 LAB — APTT: aPTT: 40 seconds — ABNORMAL HIGH (ref 24–36)

## 2018-09-14 MED ORDER — PHENYLEPHRINE HCL-NACL 40-0.9 MG/250ML-% IV SOLN
0.0000 ug/min | INTRAVENOUS | Status: AC
  Filled 2018-09-14: qty 250

## 2018-09-14 MED ORDER — INSULIN ASPART 100 UNIT/ML ~~LOC~~ SOLN
0.0000 [IU] | Freq: Three times a day (TID) | SUBCUTANEOUS | Status: DC
  Administered 2018-09-15: 4 [IU] via SUBCUTANEOUS
  Administered 2018-09-15 (×2): 3 [IU] via SUBCUTANEOUS
  Administered 2018-09-15 – 2018-09-16 (×2): 4 [IU] via SUBCUTANEOUS
  Administered 2018-09-16: 7 [IU] via SUBCUTANEOUS
  Administered 2018-09-16: 4 [IU] via SUBCUTANEOUS
  Administered 2018-09-16: 3 [IU] via SUBCUTANEOUS
  Administered 2018-09-17: 7 [IU] via SUBCUTANEOUS
  Administered 2018-09-17 – 2018-09-18 (×3): 3 [IU] via SUBCUTANEOUS
  Administered 2018-09-19: 4 [IU] via SUBCUTANEOUS
  Administered 2018-09-19: 7 [IU] via SUBCUTANEOUS
  Administered 2018-09-20: 3 [IU] via SUBCUTANEOUS
  Administered 2018-09-20: 4 [IU] via SUBCUTANEOUS
  Administered 2018-09-20: 3 [IU] via SUBCUTANEOUS
  Administered 2018-09-21: 11 [IU] via SUBCUTANEOUS
  Administered 2018-09-21: 4 [IU] via SUBCUTANEOUS

## 2018-09-14 MED ORDER — NOREPINEPHRINE 4 MG/250ML-% IV SOLN
0.0000 ug/min | INTRAVENOUS | Status: DC
  Administered 2018-09-14: 2 ug/min via INTRAVENOUS
  Filled 2018-09-14 (×2): qty 250

## 2018-09-14 MED ORDER — SODIUM CHLORIDE 0.9 % IV SOLN
0.0000 ug/min | INTRAVENOUS | Status: DC
  Administered 2018-09-14: 180 ug/min via INTRAVENOUS
  Administered 2018-09-14: 120 ug/min via INTRAVENOUS
  Administered 2018-09-14: 200 ug/min via INTRAVENOUS
  Filled 2018-09-14 (×4): qty 4

## 2018-09-14 MED ORDER — PRO-STAT SUGAR FREE PO LIQD
30.0000 mL | Freq: Two times a day (BID) | ORAL | Status: DC
  Administered 2018-09-14 – 2018-10-01 (×28): 30 mL via ORAL
  Filled 2018-09-14 (×28): qty 30

## 2018-09-14 MED ORDER — HEPARIN (PORCINE) IN NACL 100-0.45 UNIT/ML-% IJ SOLN
1100.0000 [IU]/h | INTRAMUSCULAR | Status: DC
  Administered 2018-09-14: 1000 [IU]/h via INTRAVENOUS
  Filled 2018-09-14: qty 250

## 2018-09-14 MED ORDER — ENSURE ENLIVE PO LIQD: 237.0000 mL | Freq: Two times a day (BID) | ORAL | Status: DC

## 2018-09-14 NOTE — Consult Note (Signed)
Referring Physician: Dr Valeta Harms Patient name: [name] MRN: [id number] DOB: 1941/02/13 Sex: male  REASON FOR CONSULT: right arm DVT  HPI: [name] is a 77 y.o. male admitted approximately 1 month ago with upper GI bleed.  He underwent epinephrine injection of peptic ulcers by the GI service.  He has had a PICC line in the right arm and has now developed right upper extremity swelling.  He was noted to have pulmonary embolus in the left upper lobe on September 4.  He was started on heparin and had recurrent bleeding on September 7.  He was noted on duplex exam to have DVT involving the subclavian and axillary vein.  He also currently has C. difficile colitis.  He underwent coil embolization of his gastroduodenal artery by interventional radiology on September 7.  He does not currently have active obvious ongoing bleeding.    Past Medical History:  Diagnosis Date  . Chronic osteomyelitis of lower leg (HCC) 11/20/2010   Chronic osteoarthritis of right lower leg-10/03/10 note by Dr. Harvie Heck scanned of this visit under media tab. Dr. Sharol Given states that this is chronic osteomyelitis, only cure would be amputation. Patient was not agreeable to this at his visit with Dr. Sharol Given. Patient elected to apply Bactroban cream, Dr. Sharol Given and time of that visit also prescribe doxycycline. Dr. Sharol Given stay the patient she come to the [facility] 02/26/2007   Qualifier: Diagnosis of  By: Erling Cruz  MD, [place]    . Complication of anesthesia   . Diabetes mellitus   . Diabetic peripheral neuropathy associated with type 2 diabetes mellitus ([place]) 02/07/2014  . GERD (gastroesophageal reflux disease)   . Grade II diastolic dysfunction   . Heart murmur    last 2D Echo -03/31/08  . HIATAL HERNIA WITH REFLUX 09/29/2006   Qualifier: Diagnosis of  By: Erling Cruz  MD, MELISSA    . HIP REPLACEMENT, BILATERAL, HX OF 05/12/2007   Qualifier: Diagnosis of  By: Erling Cruz  MD, MELISSA    . History of blood transfusion   . Hypertension     . OSTEOARTHRITIS 03/05/2007   Qualifier: Diagnosis of  By: Erling Cruz  MD, MELISSA    . PONV (postoperative nausea and vomiting)   . Prostate cancer ([place])   . SBO (small bowel obstruction) ([place]) 02/2017  . Seasonal allergies   . VENOUS INSUFFICIENCY, CHRONIC 02/26/2007   Qualifier: Diagnosis of  By: Erling Cruz  MD, MELISSA     Past Surgical History:  Procedure Laterality Date  . COLONOSCOPY    . COLONOSCOPY    . ESOPHAGOGASTRODUODENOSCOPY (EGD) WITH PROPOFOL N/A 08/27/2018   Procedure: ESOPHAGOGASTRODUODENOSCOPY (EGD) WITH PROPOFOL HEMOSPRAY ;  Surgeon: Otis Brace, MD;  Location: [facility];  Service: Gastroenterology;  Laterality: N/A;  bedside EGD  . ESOPHAGOGASTRODUODENOSCOPY (EGD) WITH PROPOFOL N/A 09/08/2018   Procedure: ESOPHAGOGASTRODUODENOSCOPY (EGD) WITH PROPOFOL;  Surgeon: Clarene Essex, MD;  Location: [facility];  Service: Endoscopy;  Laterality: N/A;  . HIP ARTHROPLASTY     bil  . HOT HEMOSTASIS N/A 08/27/2018   Procedure: HOT HEMOSTASIS (ARGON PLASMA COAGULATION/BICAP);  Surgeon: Otis Brace, MD;  Location: [facility];  Service: Gastroenterology;  Laterality: N/A;  bicap  . I&D EXTREMITY  08/14/2012   Procedure: IRRIGATION AND DEBRIDEMENT EXTREMITY;  Surgeon: Newt Minion, MD;  Location: [facility];  Service: Orthopedics;  Laterality: Right;  Irrigation and Debridement Right tibia, Placement antibiotic beads  . I&D EXTREMITY Left 03/18/2014   Procedure: IRRIGATION AND DEBRIDEMENT EXTREMITY;  Surgeon: Newt Minion, MD;  Location: [facility];  Service: Orthopedics;  Laterality: Left;  Debridement Left Calf Ulcer, Apply Theraskin and Wound VAC  . IR ANGIOGRAM SELECTIVE EACH ADDITIONAL VESSEL  09/05/2018  . IR ANGIOGRAM SELECTIVE EACH ADDITIONAL VESSEL  09/05/2018  . IR ANGIOGRAM SELECTIVE EACH ADDITIONAL VESSEL  09/05/2018  . IR ANGIOGRAM VISCERAL SELECTIVE  09/05/2018  . IR ANGIOGRAM VISCERAL SELECTIVE  09/05/2018  . IR EMBO ART  VEN HEMORR LYMPH EXTRAV  INC GUIDE ROADMAPPING  09/05/2018  . IR  US GUIDE VASC ACCESS RIGHT  09/05/2018  . IRRIGATION AND DEBRIDEMENT ABSCESS Left 03/18/2014   DR DUDA  . LEG SURGERY     ROD for fracture  . LEG WOUND REPAIR / CLOSURE     Beads .  Skin wound  . SKIN GRAFT     right leg- from donor skin  . SKIN GRAFT  1976   R wrist   skin graft from left thigh  . SUBMUCOSAL INJECTION  08/27/2018   Procedure: SUBMUCOSAL INJECTION;  Surgeon: Otis Brace, MD;  Location: [facility];  Service: Gastroenterology;;  . UPPER GASTROINTESTINAL ENDOSCOPY    . WISDOM TOOTH EXTRACTION    . WRIST FUSION     right wrist    Family History  Problem Relation Age of Onset  . Hypertension Mother     SOCIAL HISTORY: Social History   Socioeconomic History  . Marital status: Widowed    Spouse name: Not on file  . Number of children: 4  . Years of education: 71  . Highest education level: Not on file  Occupational History  . Occupation: retired- Administrator  Social Needs  . Financial resource strain: Not hard at all  . Food insecurity:    Worry: Never true    Inability: Never true  . Transportation needs:    Medical: No    Non-medical: No  Tobacco Use  . Smoking status: Never Smoker  . Smokeless tobacco: Never Used  Substance and Sexual Activity  . Alcohol use: No  . Drug use: No  . Sexual activity: Not Currently  Lifestyle  . Physical activity:    Days per week: 0 days    Minutes per session: 0 min  . Stress: Not at all  Relationships  . Social connections:    Talks on phone: More than three times a week    Gets together: More than three times a week    Attends religious service: Never    Active member of club or organization: No    Attends meetings of clubs or organizations: Not on file    Relationship status: Widowed  . Intimate partner violence:    Fear of current or ex partner: Not on file    Emotionally abused: Not on file    Physically abused: Not on file    Forced sexual activity: Not on file  Other Topics Concern  . Not  on file  Social History Narrative   Health Care POA:    Emergency Contact: daughter, Cheri Rous [phone]   End of Life Plan: gave pt AD pamphlet   Who lives with you: self   Any pets: none   Diet: pt has a varied diet of protein, starch and vegetables.    Exercise: Pt does not have regular exercise routine.  We discussed starting a regular chair exercise program.   Seatbelts: Pt reports wearing seatbelt when in vehicles.    Hobbies: watching car races  Allergies  Allergen Reactions  . Ace Inhibitors Cough    With Lisinopril.  . Angiotensin Receptor Blockers Other (See Comments)    Caused excessive weight gain  . Metformin Diarrhea    Current Facility-Administered Medications  Medication Dose Route Frequency Provider Last Rate Last Dose  . chlorhexidine (PERIDEX) 0.12 % solution 15 mL  15 mL Mouth Rinse BID Clarene Essex, MD   15 mL at 09/14/18 1000  . Chlorhexidine Gluconate Cloth 2 % PADS 6 each  6 each Topical Q0600 Roney Jaffe, MD   6 each at 09/14/18 0400  . dextrose 5 % solution   Intravenous Continuous Ollis, Brandi L, NP 10 mL/hr at 09/14/18 1357    . feeding supplement (ENSURE ENLIVE) (ENSURE ENLIVE) liquid 237 mL  237 mL Oral BID BM Ollis, Brandi L, NP      . feeding supplement (PRO-STAT SUGAR FREE 64) liquid 30 mL  30 mL Oral BID Ollis, Brandi L, NP   30 mL at 09/14/18 1800  . furosemide (LASIX) injection 20 mg  20 mg Intravenous Q8H Rai, Ripudeep K, MD   20 mg at 09/14/18 1500  . heparin ADULT infusion 100 units/mL (25000 units/247mL sodium chloride 0.45%)  1,000 Units/hr Intravenous Continuous Lenis Noon, RPH 10 mL/hr at 09/14/18 1917 1,000 Units/hr at 09/14/18 1917  . insulin aspart (novoLOG) injection 0-20 Units  0-20 Units Subcutaneous Q4H Roney Jaffe, MD   4 Units at 09/13/18 2044  . iohexol (OMNIPAQUE) 300 MG/ML solution 100 mL  100 mL Intravenous Once PRN Clarene Essex, MD      . latanoprost (XALATAN) 0.005 % ophthalmic solution 1 drop  1 drop  Both Eyes QHS Clarene Essex, MD   1 drop at 09/13/18 2227  . MEDLINE mouth rinse  15 mL Mouth Rinse q12n4p Clarene Essex, MD   15 mL at 09/13/18 1614  . multivitamin with minerals tablet 1 tablet  1 tablet Oral Daily Patrecia Pour, Christean Grief, MD   1 tablet at 09/14/18 1050  . norepinephrine (LEVOPHED) 4mg  in D5W 239mL premix infusion  0-40 mcg/min Intravenous Titrated Ollis, Brandi L, NP 11.25 mL/hr at 09/14/18 1851 3 mcg/min at 09/14/18 1851  . ondansetron (ZOFRAN) injection 4 mg  4 mg Intravenous Q6H PRN Clarene Essex, MD   4 mg at 09/06/18 0552  . pantoprazole (PROTONIX) EC tablet 40 mg  40 mg Oral BID AC Clarene Essex, MD   40 mg at 09/14/18 1800  . potassium chloride SA (K-DUR,KLOR-CON) CR tablet 40 mEq  40 mEq Oral BID Roney Jaffe, MD   40 mEq at 09/14/18 1803  . sodium chloride flush (NS) 0.9 % injection 10-40 mL  10-40 mL Intracatheter Q12H Clarene Essex, MD   10 mL at 09/14/18 1050  . sodium chloride flush (NS) 0.9 % injection 10-40 mL  10-40 mL Intracatheter PRN Clarene Essex, MD   20 mL at 09/10/18 0350  . sucralfate (CARAFATE) tablet 1 g  1 g Oral TID WC & HS Clarene Essex, MD   1 g at 09/14/18 1800    ROS:   General:  No weight loss, Fever, chills    Physical Examination  Vitals:   09/14/18 1600 09/14/18 1700 09/14/18 1800 09/14/18 1900  BP: (!) 71/42 (!) 115/41 (!) 111/44 (!) 138/100  Pulse: (!) 58 (!) 57 (!) 54 (!) 54  Resp: 18 20 17  (!) 23  Temp: 97.8 F (36.6 C)     TempSrc: Oral     SpO2: 100% 100% 100% 97%  Weight:      Height:        Body mass index is 44.97 kg/m.  General:  Alert and oriented, no acute distress HEENT: Normal Cardiac: Regular Rate and Rhythm  Extremity Pulses:  2+ radial, brachial, edema right arm 20% larger than left arm diffusely PICC line right arm Musculoskeletal: No deformity edema mentioned above  Neurologic: Upper and lower extremity motor 4/5 and symmetric  DATA:  Duplex review shows right subclavian axillary DVT  ASSESSMENT: Right  subclavian axillary DVT unknown whether or not this was the source of his pulmonary embolus.  Would try to resume heparin and hopefully he will not rebleed.  He is not really a candidate for mechanical or chemical thrombolysis and this is more so indicated for long-term chronic swelling rather than prevention of pulmonary embolus and still requires anticoagulation post procedure   PLAN: Standard anticoagulation with heparin hopefully no problems with recurrent bleeding very limited indications for superior vena cava filter.  I am not sure whether inferior vena cava filter is indicated in this patient with no evidence of DVT based on risk alone but will defer this to pulmonary critical care service.  PICC line is most likely the nidus for this DVT.  It should be removed as soon as possible.   Ruta Hinds, MD [facility] Office: [phone] Pager: [phone]

## 2018-09-14 NOTE — Progress Notes (Addendum)
ANTICOAGULATION CONSULT NOTE - Initial Consult  Pharmacy Consult for heparin drip - NO BOLUS Indication: PE/DVT  Allergies  Allergen Reactions  . Ace Inhibitors Cough    With Lisinopril.  . Angiotensin Receptor Blockers Other (See Comments)    Caused excessive weight gain  . Metformin Diarrhea    Patient Measurements: Height: 6' (182.9 cm) Weight: (!) 331 lb 9.2 oz (150.4 kg) IBW/kg (Calculated) : 77.6 Heparin Dosing Weight: ~112 kg  Vital Signs: Temp: 97.8 F (36.6 C) (09/16 1600) Temp Source: Oral (09/16 1600) BP: 115/41 (09/16 1700) Pulse Rate: 57 (09/16 1700)  Labs: Recent Labs    09/12/18 0627  09/13/18 0648 09/13/18 2049 09/14/18 0131 09/14/18 0132 09/14/18 1549  HGB  --    < > 9.0* 8.6*  --  8.7*  --   HCT  --   --  28.8* 27.8*  --  27.4*  --   PLT  --   --  187 177  --  193  --   CREATININE 0.79  --  0.80  --  1.15  --   --   CKTOTAL  --   --   --   --   --   --  41*  TROPONINI  --   --   --   --   --   --  0.07*   < > = values in this interval not displayed.    Estimated Creatinine Clearance: 79.9 mL/min (by C-G formula based on SCr of 1.15 mg/dL).   Medical History: Past Medical History:  Diagnosis Date  . Chronic osteomyelitis of lower leg (HCC) 11/20/2010   Chronic osteoarthritis of right lower leg-10/03/10 note by Dr. Harvie Heck scanned of this visit under media tab. Dr. Sharol Given states that this is chronic osteomyelitis, only cure would be amputation. Patient was not agreeable to this at his visit with Dr. Sharol Given. Patient elected to apply Bactroban cream, Dr. Sharol Given and time of that visit also prescribe doxycycline. Dr. Sharol Given stay the patient she come to the [facility] 02/26/2007   Qualifier: Diagnosis of  By: Erling Cruz  MD, [place]    . Complication of anesthesia   . Diabetes mellitus   . Diabetic peripheral neuropathy associated with type 2 diabetes mellitus ([place]) 02/07/2014  . GERD (gastroesophageal reflux disease)   . Grade II diastolic dysfunction   .  Heart murmur    last 2D Echo -03/31/08  . HIATAL HERNIA WITH REFLUX 09/29/2006   Qualifier: Diagnosis of  By: Erling Cruz  MD, MELISSA    . HIP REPLACEMENT, BILATERAL, HX OF 05/12/2007   Qualifier: Diagnosis of  By: Erling Cruz  MD, MELISSA    . History of blood transfusion   . Hypertension   . OSTEOARTHRITIS 03/05/2007   Qualifier: Diagnosis of  By: Erling Cruz  MD, MELISSA    . PONV (postoperative nausea and vomiting)   . Prostate cancer ([place])   . SBO (small bowel obstruction) ([place]) 02/2017  . Seasonal allergies   . VENOUS INSUFFICIENCY, CHRONIC 02/26/2007   Qualifier: Diagnosis of  By: Erling Cruz  MD, MELISSA      Assessment: Pharmacy has been consulted to dose and monitor heparin drip in this 77 year old male for PE/DVT.  Patient has had a prolonged hospitalization complicated by a PE initially on heparin drip. After being converted to apixaban on 9/7, patient developed GI bleeding requiring Andexxa and embolization and has been off of anticoagulation since that time. Heparin drip being resumed at this time  for recent PE and new RUE DVT.   Discussed plan for heparin drip with CCM MD: NO BOLUSES, will target lower end of therapeutic range of 0.3 - 0.5, will check CBC q8h initially  Today, 09/14/18  Hgb 8.7, Plt 193 stable  Baseline labs: INR 1.2, APTT 40 seconds  Patient had HL within therapeutic target range of 0.3-0.5 earlier this admission on 9/6 on heparin infusion rate of 1000 units/hr.   Goal of Therapy:  Heparin level 0.3-0.5 units/mL Monitor platelets by anticoagulation protocol: Yes   Plan:   Will initiate heparin infusion with NO BOLUS at 1000 units/hr as patient was previously therapeutic on this rate  Check HL in 8 hours  Ordered CBC q8h for 2 days per discussion with MD  Closely monitor for any signs/symptoms of bleeding   Lenis Noon, PharmD, BCPS Clinical Pharmacist 09/14/2018,6:12 PM  Addendum: Will plan to dose heparin conservatively given history of GIB and risk of bleeding.    Lenis Noon, PharmD 09/14/18 10:13 PM

## 2018-09-14 NOTE — Progress Notes (Addendum)
NAME:  [name], MRN:  [id number], DOB:  [birth year], LOS: 90 ADMISSION DATE:  08/19/2018, CONSULTATION DATE:  07/31/18 REFERRING MD:  Dr. Wendy Poet, CHIEF COMPLAINT:  Cardiac Arrest   Brief History   77 y/o M admitted 8/21 with reports of black/tarry stools and hypotension.    The patient has had a prolonged course of illness that began 7/30 with sepsis in the setting of UTI, PNA & soft tissue infection of LE chronic venous stasis.  He was hospitalized from 7/30-8/8.  That hospital course complicated by AKI, heart failure with volume overload & brief cardiac arrest (8/2) in the setting of hypoxemia.    He returned 8/21 with reports of dark tarry stools & hypotension.  On admit, he was being treated for C-Diff colitis (?timing of dx). Volume resuscitated with PRBC.  GI consulted & pt felt to have subacute GIB.  The patient declined EDG.  The patient met with Palliative Care & changed to LCB (no CPR, intubation). Declined 8/28 with hemorrhagic shock & was transferred to ICU for EGD which showed large duodenal bulb ulcer with visible vessel s/p epi, cautery.  Received Andexxa for Eliquis reversal.  He further developed hypoxic respiratory failure on 9/4 in the setting of PE and was treated with anticoagulation.  On 9/7 he had recurrent GIB with shock requiring embolization by IR.  Overnight, 9/15 he developed hypotension and was treated with neosynephrine.  BP improved with reduction of neosynephrine 9/16.     PMH   HFrEF (LVEF 30%), decubitus ulcers, chronic venous insufficiency, DM, HTN, Anemia, HLD, GERD, prostate adenocarcinoma  Significant Hospital Events   8/21  Admit with dark tarry stool, hypotension  8/23  To [facility] 8/27  LCB established 8/28  GIB, PCCM called back for hemorrhagic shock 8/29  EGD with large duodenal ulcer, visible vessel s/p epi, cautery  9/04  Hypoxic resp fx in setting of PE.  Rx with heparin 9/07  GIB, hemorrhagic shock, s/p IR embolization  9/09  Off  vasopressors 9/15  Hypotension, started on neo (bp improved off neo, LVEF 30%  Consults: date of consult/date signed off & final recs:  [facility]  GI  IR  Procedures (surgical and bedside):  RUE PICC 8/30 >>  Significant Diagnostic Tests:  EGD 8/29 >> large duodenal ulcer with visible vessel, biopsy c/w chronic gastritis, focal inflammation  CTA Chest 9/4 >> positive for PE in segmental branches of LUL EGD 9/10 >> no bleeding LE Doppler 9/9 >> negative   Micro Data: C Diff PCR 8/21 >> antigen positive, toxin negative  UC 8/21 >> multiple species, recollection suggested BCx2 8/21 >> negative  Antimicrobials:    Subjective:  Pt denies acute complaints.  Remains on neosynephrine, weaning.  I/O-positive 11.7L for admit.   Objective   Blood pressure (!) 136/58, pulse (!) 58, temperature 99.3 F (37.4 C), temperature source Axillary, resp. rate 20, height 6' (1.829 m), weight (!) 150.4 kg, SpO2 100 %. CVP:  [10 mmHg-17 mmHg] 10 mmHg  FiO2 (%):  [28 %] 28 %   Intake/Output Summary (Last 24 hours) at 09/14/2018 1214 Last data filed at 09/14/2018 1200 Gross per 24 hour  Intake 3093.08 ml  Output 700 ml  Net 2393.08 ml   Filed Weights   09/12/18 0405 09/13/18 0300 09/14/18 0600  Weight: (!) 145 kg (!) 149 kg (!) 150.4 kg    Examination: General: elderly male lying in bed in NAD HENT: MM pink/moist, tolerating PO's Lungs: even/non-labored, lungs bilaterally clear anterior, diminished  bases  Cardiovascular: s1s2 rrr, 3/6 holosystolic murmur Abdomen: obese/soft, bsx4 active  Extremities: warm/dry, BLE wrapped in compressive bandages, changes to LE's consistent with venous stasis, RUE edema >L Neuro: AAOx4, MAE   Resolved Hospital Problem list     Assessment & Plan:   Hypotension - ddx includes increased SVR from neosynephrine, intravascular depletion, MI / ?new murmur, adrenal insufficiency and infection (WBC/afebrile argues against) P: Levo for MAP >60, good mental status  and UOP Discontinue phenylephrine  Repeat cultures Assess EKG, troponin, BNP, PCT, co-ox, cortisol.  Note reduction in LVEF from 60 04/2018 to 30% in 07/2018 Trend CVP Q8 (~10 this am) KVO IVF  ? New Murmur - noted on exam 9/16 but not documented on Card's exam 8/2 LVEF Reduction - down since May 2019, likely post arrest, ? Recovery Hx Loletha Grayer, SVT - during recent admit P: Cardiology called for repeat evaluation, to see 9/17 Work up as above  Repeat limited ECHO  Acute on Chronic Hypercarbic Respiratory Failure - in setting of PE Hx OSA  P: O2 as needed to support sats >90% Doubt he could ever be anticoagulated given 2 large volume bleeds with shock.   Nocturnal CPAP  Assess CXR now & in am  Left Segmental PE  - not on anticoagulation due to massive GIB x2 P: IR consulted for possible IVC filter   AKI  - in setting of hemorrhagic shock, diuresis, ?CHF P: Trend BMP / urinary output Replace electrolytes as indicated Avoid nephrotoxic agents, ensure adequate renal perfusion  Upper GIB with Hemorrhagic Shock  - in setting of duodenal ulcar, GI following, 9/10 EGD negative for bleeding P: Trend CBC IR consulted for IVC filter  RUE Swelling - hx PE, concern for DVT with andexxa administration  P: Assess venous doppler of extremities x4   Recent Treatment for C-Diff  P: Monitor stool output, fever curve, WBC trend  Moderate Protein Calorie Malnutrition  P: Ensure BID  Prostat   BLE Venous Insufficiency P: Compression dressings  Hx Prostate Cancer  P: Outpatient follow up with ONC  Disposition / Summary of Today's Plan 09/14/18   ICU, wean pressors.  Work up source of hypotension.     Diet: PO Pain/Anxiety/Delirium protocol (if indicated): n/a VAP protocol (if indicated) n/a DVT prophylaxis: SCD's, anticoagulation on hold with bleeding  GI prophylaxis: PPI  Hyperglycemia protocol: SSI, resistant scale  Mobility: As tolerated, PT consult Code Status: LCB   Family Communication: Patient updated on plan of care  Labs   CBC: Recent Labs  Lab 09/07/18 1341  09/09/18 0351 09/10/18 0431 09/11/18 0339 09/13/18 0648 09/13/18 2049 09/14/18 0132  WBC 11.6*   < > 10.8* 10.2 8.5 11.0* 8.6 9.5  NEUTROABS 9.8*  --  8.3* 8.8* 6.4  --   --   --   HGB 8.4*   < > 8.7* 9.5* 8.9* 9.0* 8.6* 8.7*  HCT 27.2*   < > 29.1* 31.9* 29.5* 28.8* 27.8* 27.4*  MCV 93.8   < > 97.3 99.1 97.7 94.7 95.2 94.5  PLT 108*   < > 138* 169 184 187 177 193   < > = values in this interval not displayed.   Basic Metabolic Panel: Recent Labs  Lab 09/08/18 0535 09/09/18 0351 09/10/18 0431 09/11/18 0339 09/12/18 0627 09/13/18 0648 09/14/18 0131  NA  --  147* 150* 150* 146* 146* 143  K  --  3.2* 4.5 4.2 3.3* 3.8 4.1  CL  --  114* 114* 112* 106 107 105  CO2  --  29 31 32 34* 33* 32  GLUCOSE  --  109* 142* 91 91 107* 75  BUN  --  '12 11 9 12 14 18  ' CREATININE  --  0.53* 0.66 0.70 0.79 0.80 1.15  CALCIUM  --  7.4* 9.0 8.5* 8.3* 8.0* 8.1*  MG 2.2 1.7 1.9 1.6*  --   --   --   PHOS 3.3 2.5 3.1 2.5  --   --   --    GFR: Estimated Creatinine Clearance: 79.9 mL/min (by C-G formula based on SCr of 1.15 mg/dL). Recent Labs  Lab 09/08/18 0535  09/11/18 0339 09/13/18 0648 09/13/18 2049 09/14/18 0132  WBC 13.3*   < > 8.5 11.0* 8.6 9.5  LATICACIDVEN 0.5  --   --   --   --   --    < > = values in this interval not displayed.   Liver Function Tests: No results for input(s): AST, ALT, ALKPHOS, BILITOT, PROT, ALBUMIN in the last 168 hours. Recent Labs  Lab 09/08/18 0535  LIPASE 18   No results for input(s): AMMONIA in the last 168 hours.   ABG    Component Value Date/Time   PHART 7.392 09/14/2018 0615   PCO2ART 53.6 (H) 09/14/2018 0615   PO2ART 74.0 (L) 09/14/2018 0615   HCO3 32.0 (H) 09/14/2018 0615   TCO2 35 (H) 08/19/2018 1527   ACIDBASEDEF 0.3 07/31/2018 1121   O2SAT 95.5 09/14/2018 0615    Coagulation Profile: No results for input(s): INR, PROTIME in the  last 168 hours.   Cardiac Enzymes: Recent Labs  Lab 09/08/18 0535  TROPONINI 0.26*   HbA1C: Hgb A1c MFr Bld  Date/Time Value Ref Range Status  05/19/2018 08:48 AM 9.3 (H) 4.8 - 5.6 % Final    Comment:    (NOTE) Pre diabetes:          5.7%-6.4% Diabetes:              >6.4% Glycemic control for   <7.0% adults with diabetes   03/15/2017 05:51 AM 14.3 (H) 4.8 - 5.6 % Final    Comment:    (NOTE)         Pre-diabetes: 5.7 - 6.4         Diabetes: >6.4         Glycemic control for adults with diabetes: <7.0    CBG: Recent Labs  Lab 09/13/18 1606 09/13/18 2007 09/13/18 2330 09/14/18 0349 09/14/18 [place], NP-C [facility] Pgr: [phone] or if no answer [phone] 09/14/2018, 12:15 PM

## 2018-09-14 NOTE — Progress Notes (Signed)
PT Cancellation Note  Patient Details Name: SOTA HETZ MRN: [id number] DOB: 1941-09-29   Cancelled Treatment:    Reason Eval/Treat Not Completed: Medical issues which prohibited therapy, Patient on Neo for hypotn. Will check back another time when off of med  And BP stable.[place] Pager [phone] Office [phone]    Claretha Cooper 09/14/2018, 8:37 AM

## 2018-09-14 NOTE — Plan of Care (Signed)
Overnight events noted, now in ICU, patient with persistent hypotension.  Currently on BiPAP, started on pressors phenylephrine IV infusion overnight by CCM.   Discussed with Dr. Nelda Marseille, PCCM will assume care until patient stable to transfer to [facility].    Estill Cotta M.D. [facility] 09/14/2018, 7:32 AM  Pager: [phone]

## 2018-09-14 NOTE — Progress Notes (Signed)
Preliminary notes--Bilateral lower extremities venous duplex exam completed. Negative for DVT where exam can access. Limited study due to patient's condition, bilateral calf veins not be able to access, study cannot completely exclude the possibility of thrombosis at calf region.  Hongying Landry Mellow (RDMS RVT) 09/14/18 3:22 PM

## 2018-09-14 NOTE — Progress Notes (Signed)
Preliminary notes--Bilateral upper extremities venous duplex exam completed. Positive acute deep and superficial veins thrombosis involving right internal jugular vein, right subclavian vein, right axillary vein and right brachial vein. Findings consistent with acute superficial vein thrombosis involving the right axillary vein, right basilic vein and right cephalic vein.  Left side: No evidence of deep vein thrombosis in the upper extremity. Findings consistent with acute superficial vein thrombosis involving the left cephalic vein.  Result notified RN.  Hongying Landry Mellow (RDMS RVT) 09/14/18 3:19 PM

## 2018-09-14 NOTE — Progress Notes (Signed)
eLink Physician-Brief Progress Note Patient Name: [name] DOB: July 06, 1941 MRN: [id number]   Date of Service  09/14/2018  HPI/Events of Note  Hypotension - BP = 85/39 with MAP = 54. CVP = 17, Hgb = 8.6 and COOX = 71.4.  eICU Interventions  Will order: 1. CBC now.  2. Continue to titrate Phenylephrine IV infusion up to goal MAP >= 65.     Intervention Category Major Interventions: Hypotension - evaluation and management  Sommer,Steven Eugene 09/14/2018, 1:15 AM

## 2018-09-14 NOTE — Care Management Note (Signed)
Case Management Note  Patient Details  Name: BRENTLY VOORHIS MRN: [id number] Date of Birth: 18-Aug-1941  Subjective/Objective:                  Hypotensive,remains on BiPap at 28%, Iv lasix 20mg  q8hrs,iv neo. Drip for bpo support. Iv d5w at k[facility]  Action/Plan: Will follow for progression Will follow for cm needs none present at this time.  Expected Discharge Date:  08/22/18               Expected Discharge Plan:  Skilled Nursing Facility  In-House Referral:  Clinical Social Work  Discharge planning Services  CM Consult  Post Acute Care Choice:  NA Choice offered to:  NA  DME Arranged:  N/A DME Agency:  NA  HH Arranged:  NA HH Agency:  NA  Status of Service:  Completed, signed off  If discussed at [employer] of Stay Meetings, dates discussed:    Additional Comments:  Leeroy Cha, RN 09/14/2018, 10:09 AM

## 2018-09-14 NOTE — Progress Notes (Addendum)
Discussed new development of RUE DVT with Dr. Paulita Fujita and risk for anticoagulation.  Recent PE not on anticoagulation due to GIB. IR was following for IVC filter but LE dopplers are negative.  Concern for clot to subclavian vein on R.  Risk for ulcer re-bleed not on anticoagulation is approximately 15-20%, 30% on anticoagulation.  If anticoagulation restarted, recommended use of short acting anticoagulation with prolonged observation before transition to oral anticoagulation.     Family updated by Dr. Valeta Harms Cheri Rous, daughter) via phone.  Risks of re-bleed discussed with her.  Concern that risk of death from PE is higher than risk of death from bleeding at this point.  Risks reviewed with patient by NP & Dr. Valeta Harms.  As well as his daughter.  Plan to proceed with heparin gtt without bolus and monitor closely for re-bleeding.  Patient accepts risk of bleeding at this time.       Noe Gens, NP-C [facility] Pgr: [phone] or if no answer [phone] 09/14/2018, 4:06 PM

## 2018-09-14 NOTE — Progress Notes (Signed)
PCCM:  Long discussion, risk vs benefits vs alternatives to anticoagulation with heparin. This was discussed with the patient as well as his daughter via phone.   Decision was made to start heparin with no bolus.   PICC needs to be removed however this is our current vascular access.   If able to obtain PIV and tolerating heparin will remove PICC.   Patient, family and nursing all aware of bleeding risk with re-initiation of heparin.   [facility] 09/14/2018 5:05 PM  Personal pager: [phone] If unanswered, please page CCM On-call: [phone]

## 2018-09-14 NOTE — Progress Notes (Signed)
Patient ID: [name], male   DOB: 01-Nov-1941, 77 y.o.   MRN: [id number] Request noted for IVC filter placement in patient.  Latest venous Doppler studies of lower extremities reported as negative on preliminary report.  Results reviewed with Dr. Kathlene Cote and filter not indicated at this time.

## 2018-09-15 ENCOUNTER — Inpatient Hospital Stay (HOSPITAL_COMMUNITY): Payer: PPO

## 2018-09-15 DIAGNOSIS — E8779 Other fluid overload: Secondary | ICD-10-CM

## 2018-09-15 DIAGNOSIS — I351 Nonrheumatic aortic (valve) insufficiency: Secondary | ICD-10-CM

## 2018-09-15 LAB — COMPREHENSIVE METABOLIC PANEL
ALT: 40 U/L (ref 0–44)
ANION GAP: 6 (ref 5–15)
AST: 50 U/L — ABNORMAL HIGH (ref 15–41)
Albumin: 2 g/dL — ABNORMAL LOW (ref 3.5–5.0)
Alkaline Phosphatase: 198 U/L — ABNORMAL HIGH (ref 38–126)
BUN: 16 mg/dL (ref 8–23)
CALCIUM: 7.9 mg/dL — AB (ref 8.9–10.3)
CO2: 33 mmol/L — ABNORMAL HIGH (ref 22–32)
Chloride: 99 mmol/L (ref 98–111)
Creatinine, Ser: 0.97 mg/dL (ref 0.61–1.24)
GFR calc non Af Amer: >60 mL/min (ref >60)
Glucose, Bld: 249 mg/dL — ABNORMAL HIGH (ref 70–99)
Potassium: 4.4 mmol/L (ref 3.5–5.1)
SODIUM: 138 mmol/L (ref 135–145)
TOTAL PROTEIN: 5.5 g/dL — AB (ref 6.5–8.1)
Total Bilirubin: 0.8 mg/dL (ref 0.3–1.2)

## 2018-09-15 LAB — CBC
HCT: 29.5 % — ABNORMAL LOW (ref 39.0–52.0)
HCT: 30.4 % — ABNORMAL LOW (ref 39.0–52.0)
HEMATOCRIT: 29.7 % — AB (ref 39.0–52.0)
HEMOGLOBIN: 9.2 g/dL — AB (ref 13.0–17.0)
HEMOGLOBIN: 9.7 g/dL — AB (ref 13.0–17.0)
HEMOGLOBIN: 9.3 g/dL — AB (ref 13.0–17.0)
MCH: 29.4 pg (ref 26.0–34.0)
MCH: 29.6 pg (ref 26.0–34.0)
MCH: 29 pg (ref 26.0–34.0)
MCHC: 31.2 g/dL (ref 30.0–36.0)
MCHC: 31.9 g/dL (ref 30.0–36.0)
MCHC: 31.3 g/dL (ref 30.0–36.0)
MCV: 94.2 fL (ref 78.0–100.0)
MCV: 92.7 fL (ref 78.0–100.0)
MCV: 92.5 fL (ref 78.0–100.0)
Platelets: 206 10*3/uL (ref 150–400)
Platelets: 185 10*3/uL (ref 150–400)
Platelets: 200 10*3/uL (ref 150–400)
RBC: 3.13 MIL/uL — AB (ref 4.22–5.81)
RBC: 3.28 MIL/uL — AB (ref 4.22–5.81)
RBC: 3.21 MIL/uL — ABNORMAL LOW (ref 4.22–5.81)
RDW: 15.1 % (ref 11.5–15.5)
RDW: 14.9 % (ref 11.5–15.5)
RDW: 14.9 % (ref 11.5–15.5)
WBC: 5.9 10*3/uL (ref 4.0–10.5)
WBC: 28.5 10*3/uL — AB (ref 4.0–10.5)
WBC: 32.1 10*3/uL — AB (ref 4.0–10.5)

## 2018-09-15 LAB — GLUCOSE, CAPILLARY
GLUCOSE-CAPILLARY: 124 mg/dL — AB (ref 70–99)
Glucose-Capillary: 140 mg/dL — ABNORMAL HIGH (ref 70–99)
Glucose-Capillary: 164 mg/dL — ABNORMAL HIGH (ref 70–99)
Glucose-Capillary: 180 mg/dL — ABNORMAL HIGH (ref 70–99)
Glucose-Capillary: 87 mg/dL (ref 70–99)

## 2018-09-15 LAB — HEPARIN LEVEL (UNFRACTIONATED)
HEPARIN UNFRACTIONATED: 0.1 [IU]/mL — AB (ref 0.30–0.70)
Heparin Unfractionated: 0.15 IU/mL — ABNORMAL LOW (ref 0.30–0.70)
Heparin Unfractionated: 0.29 IU/mL — ABNORMAL LOW (ref 0.30–0.70)

## 2018-09-15 LAB — PROCALCITONIN: PROCALCITONIN: 0.23 ng/mL

## 2018-09-15 LAB — ECHOCARDIOGRAM LIMITED
HEIGHTINCHES: 72 in
Weight: 5209.91 oz

## 2018-09-15 LAB — TROPONIN I: Troponin I: 0.06 ng/mL (ref <0.03)

## 2018-09-15 MED ORDER — HEPARIN (PORCINE) IN NACL 100-0.45 UNIT/ML-% IJ SOLN
1400.0000 [IU]/h | INTRAMUSCULAR | Status: DC
  Administered 2018-09-15: 1300 [IU]/h via INTRAVENOUS
  Administered 2018-09-16 – 2018-09-17 (×2): 1400 [IU]/h via INTRAVENOUS
  Filled 2018-09-15 (×4): qty 250

## 2018-09-15 MED ORDER — BOOST / RESOURCE BREEZE PO LIQD CUSTOM
1.0000 | Freq: Two times a day (BID) | ORAL | Status: DC
  Administered 2018-09-15 – 2018-09-30 (×19): 1 via ORAL

## 2018-09-15 MED ORDER — HYDROCORTISONE NA SUCCINATE PF 100 MG IJ SOLR
50.0000 mg | Freq: Three times a day (TID) | INTRAMUSCULAR | Status: DC
  Administered 2018-09-15 – 2018-09-16 (×4): 50 mg via INTRAVENOUS
  Filled 2018-09-15 (×4): qty 2

## 2018-09-15 MED ORDER — NOREPINEPHRINE BITARTRATE 1 MG/ML IV SOLN
0.0000 ug/min | INTRAVENOUS | Status: DC
  Administered 2018-09-15: 5 ug/min via INTRAVENOUS
  Filled 2018-09-15 (×2): qty 4

## 2018-09-15 NOTE — Consult Note (Signed)
Cardiology Consultation:   Patient ID: [name] MRN: [id number]; DOB: 1941/10/23  Admit date: 08/19/2018 Date of Consult: 09/15/2018  Primary Care Provider: [place] Bing, DO Primary Cardiologist: N[place]  Primary Electrophysiologist:  None     Patient Profile:   [name] is a 77 y.o. male with a hx of CHF  who is being seen today for the evaluation of CHF  at the request of CCM and Dr Nelda Marseille. Marland Kitchen  History of Present Illness:   [name] 77 y.o. debilitated bed ridden partial code Been in hospital for a couple of months with complicated course. Admitted with sepsis , respiratory failure and pneumonia Has had massive GI bleed requiring transfusion and embolization and PE with UE DVT from [facility] line and heparin just restarted. Has not had any chest pain minimal troponin elevation and ECG with chronic anterolateral T wave changes He has been hypotensive and had PEA arrest earlier in hospitalization in setting or respiratory failure and sepsis. Echo May 23 with EF 60-65% and TTE 8/4 with decline to 30-35% Palliative care has seen This am he complains of mild dyspnea   Past Medical History:  Diagnosis Date  . Chronic osteomyelitis of lower leg (HCC) 11/20/2010   Chronic osteoarthritis of right lower leg-10/03/10 note by Dr. Harvie Heck scanned of this visit under media tab. Dr. Sharol Given states that this is chronic osteomyelitis, only cure would be amputation. Patient was not agreeable to this at his visit with Dr. Sharol Given. Patient elected to apply Bactroban cream, Dr. Sharol Given and time of that visit also prescribe doxycycline. Dr. Sharol Given stay the patient she come to the [place] 02/26/2007   Qualifier: Diagnosis of  By: Erling Cruz  MD, [place]    . Complication of anesthesia   . Diabetes mellitus   . Diabetic peripheral neuropathy associated with type 2 diabetes mellitus ([place]) 02/07/2014  . GERD (gastroesophageal reflux disease)   . Grade II diastolic dysfunction   . Heart murmur    last 2D Echo  -03/31/08  . HIATAL HERNIA WITH REFLUX 09/29/2006   Qualifier: Diagnosis of  By: Erling Cruz  MD, MELISSA    . HIP REPLACEMENT, BILATERAL, HX OF 05/12/2007   Qualifier: Diagnosis of  By: Erling Cruz  MD, MELISSA    . History of blood transfusion   . Hypertension   . OSTEOARTHRITIS 03/05/2007   Qualifier: Diagnosis of  By: Erling Cruz  MD, MELISSA    . PONV (postoperative nausea and vomiting)   . Prostate cancer ([place])   . SBO (small bowel obstruction) ([place]) 02/2017  . Seasonal allergies   . VENOUS INSUFFICIENCY, CHRONIC 02/26/2007   Qualifier: Diagnosis of  By: Erling Cruz  MD, MELISSA      Past Surgical History:  Procedure Laterality Date  . COLONOSCOPY    . COLONOSCOPY    . ESOPHAGOGASTRODUODENOSCOPY (EGD) WITH PROPOFOL N/A 08/27/2018   Procedure: ESOPHAGOGASTRODUODENOSCOPY (EGD) WITH PROPOFOL HEMOSPRAY ;  Surgeon: Otis Brace, MD;  Location: [facility];  Service: Gastroenterology;  Laterality: N/A;  bedside EGD  . ESOPHAGOGASTRODUODENOSCOPY (EGD) WITH PROPOFOL N/A 09/08/2018   Procedure: ESOPHAGOGASTRODUODENOSCOPY (EGD) WITH PROPOFOL;  Surgeon: Clarene Essex, MD;  Location: [facility];  Service: Endoscopy;  Laterality: N/A;  . HIP ARTHROPLASTY     bil  . HOT HEMOSTASIS N/A 08/27/2018   Procedure: HOT HEMOSTASIS (ARGON PLASMA COAGULATION/BICAP);  Surgeon: Otis Brace, MD;  Location: [facility];  Service: Gastroenterology;  Laterality: N/A;  bicap  . I&D EXTREMITY  08/14/2012   Procedure: IRRIGATION AND  DEBRIDEMENT EXTREMITY;  Surgeon: Newt Minion, MD;  Location: [place];  Service: Orthopedics;  Laterality: Right;  Irrigation and Debridement Right tibia, Placement antibiotic beads  . I&D EXTREMITY Left 03/18/2014   Procedure: IRRIGATION AND DEBRIDEMENT EXTREMITY;  Surgeon: Newt Minion, MD;  Location: [place];  Service: Orthopedics;  Laterality: Left;  Debridement Left Calf Ulcer, Apply Theraskin and Wound VAC  . IR ANGIOGRAM SELECTIVE EACH ADDITIONAL VESSEL  09/05/2018  . IR ANGIOGRAM SELECTIVE EACH  ADDITIONAL VESSEL  09/05/2018  . IR ANGIOGRAM SELECTIVE EACH ADDITIONAL VESSEL  09/05/2018  . IR ANGIOGRAM VISCERAL SELECTIVE  09/05/2018  . IR ANGIOGRAM VISCERAL SELECTIVE  09/05/2018  . IR EMBO ART  VEN HEMORR LYMPH EXTRAV  INC GUIDE ROADMAPPING  09/05/2018  . IR US GUIDE VASC ACCESS RIGHT  09/05/2018  . IRRIGATION AND DEBRIDEMENT ABSCESS Left 03/18/2014   DR DUDA  . LEG SURGERY     ROD for fracture  . LEG WOUND REPAIR / CLOSURE     Beads .  Skin wound  . SKIN GRAFT     right leg- from donor skin  . SKIN GRAFT  1976   R wrist   skin graft from left thigh  . SUBMUCOSAL INJECTION  08/27/2018   Procedure: SUBMUCOSAL INJECTION;  Surgeon: Otis Brace, MD;  Location: [facility];  Service: Gastroenterology;;  . UPPER GASTROINTESTINAL ENDOSCOPY    . WISDOM TOOTH EXTRACTION    . WRIST FUSION     right wrist     Home Medications:  Prior to Admission medications   Medication Sig Start Date End Date Taking? Authorizing Provider  aspirin 81 MG chewable tablet Chew 81 mg by mouth every other day.    Yes [provider]  collagenase (SANTYL) ointment Apply 1 application topically daily.   Yes [provider]  ferrous sulfate 325 (65 FE) MG tablet Take 1 tablet (325 mg total) by mouth 2 (two) times daily with a meal. 08/07/18  Yes Anderson, Chelsey L, DO  FLOVENT HFA 220 MCG/ACT inhaler INHALE 1 PUFF INTO THE LUNGS TWICE DAILY Patient taking differently: Inhale 1 puff into the lungs 2 (two) times daily.  10/02/17  Yes Mikell, Jeani Sow, MD  fluticasone (FLONASE) 50 MCG/ACT nasal spray Place 2 sprays into both nostrils daily.   Yes [provider]  Infant Care Products (DERMACLOUD) CREA Apply 1 application topically 3 (three) times daily.   Yes [provider]  latanoprost (XALATAN) 0.005 % ophthalmic solution INSTILL 1 DROP IN BOTH EYES EVERY EVENING FOR GLAUCOMA Patient taking differently: Place 1 drop into both eyes at bedtime.  07/08/18  Yes [place] Bing, DO   losartan (COZAAR) 25 MG tablet Take 1 tablet (25 mg total) by mouth daily. 08/08/18  Yes Anderson, Chelsey L, DO  metoprolol tartrate (LOPRESSOR) 25 MG tablet Take 0.5 tablets (12.5 mg total) by mouth 2 (two) times daily. 08/07/18  Yes Anderson, Chelsey L, DO  nystatin (MYCOSTATIN/NYSTOP) powder Apply topically 2 (two) times daily. 07/13/18  Yes [place] Bing, DO  pravastatin (PRAVACHOL) 40 MG tablet TAKE 1 TABLET BY MOUTH DAILY 12/26/17  Yes Mikell, Jeani Sow, MD  Probiotic Product (PROBIOTIC PO) Take 2 capsules by mouth daily.   Yes [provider]  tamsulosin (FLOMAX) 0.4 MG CAPS capsule TAKE 2 CAPSULES BY MOUTH EVERY MORNING Patient taking differently: Take 0.8 mg by mouth daily.  07/08/18  Yes [place] Bing, DO  torsemide (DEMADEX) 20 MG tablet Take 2 tablets (40 mg total) by  mouth daily. 08/08/18  Yes Anderson, Chelsey L, DO  urea 10 % lotion Apply topically 2 (two) times daily. 08/07/18  Yes Anderson, Chelsey L, DO  Vancomycin HCl (FIRVANQ) 50 MG/ML SOLR Take 2.5 mLs by mouth 4 (four) times daily.   Yes [provider]  famotidine (PEPCID) 20 MG tablet Take 2 tablets (40 mg total) by mouth daily. 08/23/18   Georgette Shell, MD  Hydrocortisone (GERHARDT'S BUTT CREAM) CREA Apply 1 application topically 3 (three) times daily. Patient not taking: Reported on 08/19/2018 08/07/18   Doristine Mango L, DO  mometasone (NASONEX) 50 MCG/ACT nasal spray INSTILL 2 SPRAYS INTO THE NOSE DAILY Patient not taking: Reported on 08/19/2018 07/08/18   [place] Bing, DO  PROAIR HFA 108 (90 Base) MCG/ACT inhaler INHALE 2 PUFFS BY MOUTH EVERY 4 HOURS AS NEEDED FOR WHEEZING OR SHORTNESS OF BREATH Patient taking differently: Inhale 2 puffs into the lungs every 4 (four) hours as needed for wheezing or shortness of breath.  04/18/18   Mikell, Jeani Sow, MD  traMADol (ULTRAM) 50 MG tablet Take 1 tablet (50 mg total) by mouth every 6 (six) hours as needed for moderate pain or severe pain.  08/22/18   Georgette Shell, MD  vancomycin (VANCOCIN) 50 mg/mL oral solution Take 2.5 mLs (125 mg total) by mouth 4 (four) times daily. 08/22/18   Georgette Shell, MD    Inpatient Medications: Scheduled Meds: . chlorhexidine  15 mL Mouth Rinse BID  . Chlorhexidine Gluconate Cloth  6 each Topical Q0600  . feeding supplement (ENSURE ENLIVE)  237 mL Oral BID BM  . feeding supplement (PRO-STAT SUGAR FREE 64)  30 mL Oral BID  . furosemide  20 mg Intravenous Q8H  . insulin aspart  0-20 Units Subcutaneous TID AC & HS  . latanoprost  1 drop Both Eyes QHS  . mouth rinse  15 mL Mouth Rinse q12n4p  . multivitamin with minerals  1 tablet Oral Daily  . pantoprazole  40 mg Oral BID AC  . potassium chloride  40 mEq Oral BID  . sodium chloride flush  10-40 mL Intracatheter Q12H  . sucralfate  1 g Oral TID WC & HS   Continuous Infusions: . dextrose 10 mL/hr at 09/15/18 0400  . heparin 1,100 Units/hr (09/15/18 0448)  . norepinephrine (LEVOPHED) Adult infusion 3 mcg/min (09/15/18 0444)   PRN Meds: iohexol, ondansetron (ZOFRAN) IV, sodium chloride flush  Allergies:    Allergies  Allergen Reactions  . Ace Inhibitors Cough    With Lisinopril.  . Angiotensin Receptor Blockers Other (See Comments)    Caused excessive weight gain  . Metformin Diarrhea    Social History:   Social History   Socioeconomic History  . Marital status: Widowed    Spouse name: Not on file  . Number of children: 4  . Years of education: 77  . Highest education level: Not on file  Occupational History  . Occupation: retired- Administrator  Social Needs  . Financial resource strain: Not hard at all  . Food insecurity:    Worry: Never true    Inability: Never true  . Transportation needs:    Medical: No    Non-medical: No  Tobacco Use  . Smoking status: Never Smoker  . Smokeless tobacco: Never Used  Substance and Sexual Activity  . Alcohol use: No  . Drug use: No  . Sexual activity: Not Currently    Lifestyle  . Physical activity:    Days per week:  0 days    Minutes per session: 0 min  . Stress: Not at all  Relationships  . Social connections:    Talks on phone: More than three times a week    Gets together: More than three times a week    Attends religious service: Never    Active member of club or organization: No    Attends meetings of clubs or organizations: Not on file    Relationship status: Widowed  . Intimate partner violence:    Fear of current or ex partner: Not on file    Emotionally abused: Not on file    Physically abused: Not on file    Forced sexual activity: Not on file  Other Topics Concern  . Not on file  Social History Narrative   Health Care POA:    Emergency Contact: daughter, Cheri Rous [phone]   End of Life Plan: gave pt AD pamphlet   Who lives with you: self   Any pets: none   Diet: pt has a varied diet of protein, starch and vegetables.    Exercise: Pt does not have regular exercise routine.  We discussed starting a regular chair exercise program.   Seatbelts: Pt reports wearing seatbelt when in vehicles.    Hobbies: watching car races          Family History:    Family History  Problem Relation Age of Onset  . Hypertension Mother      ROS:  Please see the history of present illness.   All other ROS reviewed and negative.     Physical Exam/Data:   Vitals:   09/15/18 0430 09/15/18 0440 09/15/18 0445 09/15/18 0500  BP: (!) 77/38 (!) 87/38  (!) 94/52  Pulse: 60  (!) 59 74  Resp: 12  19 (!) 24  Temp:      TempSrc:      SpO2: 100%  100% 100%  Weight:    (!) 147.7 kg  Height:        Intake/Output Summary (Last 24 hours) at 09/15/2018 0800 Last data filed at 09/15/2018 0400 Gross per 24 hour  Intake 1215.93 ml  Output 2235 ml  Net -1019.07 ml   Filed Weights   09/13/18 0300 09/14/18 0600 09/15/18 0500  Weight: (!) 149 kg (!) 150.4 kg (!) 147.7 kg   Body mass index is 44.16 kg/m.  Affect appropriate Chronically ill  black male  HEENT: normal Neck supple with no adenopathy JVP normal no bruits no thyromegaly Lungs decreased BS base bilateral  Heart:  S1/S2 no murmur, no rub, gallop or click PMI normal Abdomen: benighn, BS positve, no tenderness, no AAA no bruit.  No HSM or HJR Distal pulses intact with no bruits Edema to mid thighs bilaterally  Neuro non-focal Skin warm and dry PIC line still in RUE more swollen than left    EKG:  The EKG was personally reviewed and demonstrates:  SR chronic anterolateral T wave changes  Telemetry:  Telemetry was personally reviewed and demonstrates:  NSR / Sinus tachycardia   Relevant CV Studies: TTE EF 30-35% 08/02/18 Repeat limited for EF pending  [place] RUE DVT no LE DVT   Laboratory Data:  Chemistry Recent Labs  Lab 09/13/18 0648 09/14/18 0131 09/15/18 0254  NA 146* 143 138  K 3.8 4.1 4.4  CL 107 105 99  CO2 33* 32 33*  GLUCOSE 107* 75 249*  BUN 14 18 16   CREATININE 0.80 1.15 0.97  CALCIUM 8.0* 8.1* 7.9*  GFRNONAA >60 59* >60  GFRAA >60 >60 >60  ANIONGAP 6 6 6     Recent Labs  Lab 09/14/18 1549 09/15/18 0254  PROT 5.3* 5.5*  ALBUMIN 2.0* 2.0*  AST 55* 50*  ALT 34 40  ALKPHOS 186* 198*  BILITOT 0.8 0.8   Hematology Recent Labs  Lab 09/13/18 2049 09/14/18 0132 09/15/18 0300  WBC 8.6 9.5 5.9  RBC 2.92* 2.90* 3.13*  HGB 8.6* 8.7* 9.2*  HCT 27.8* 27.4* 29.5*  MCV 95.2 94.5 94.2  MCH 29.5 30.0 29.4  MCHC 30.9 31.8 31.2  RDW 15.2 15.1 15.1  PLT 177 193 206   Cardiac Enzymes Recent Labs  Lab 09/14/18 1549 09/14/18 2034 09/15/18 0234  TROPONINI 0.07* 0.06* 0.06*   No results for input(s): TROPIPOC in the last 168 hours.  BNP Recent Labs  Lab 09/14/18 1549  BNP 477.7*    DDimer No results for input(s): DDIMER in the last 168 hours.  Radiology/Studies:  Dg Chest Port 1 View  Result Date: 09/14/2018 CLINICAL DATA:  Acute respiratory failure and hypoxia. EXAM: PORTABLE CHEST 1 VIEW COMPARISON:  09/10/2018 FINDINGS: Right  arm PICC tip is in the SVC 2 cm above the right atrium. There is less pleural fluid and less basilar atelectasis. Some does persist, left more than right. Upper lungs are clear. IMPRESSION: Radiographic improvement with less pleural fluid and basilar volume loss. Residual findings remain more pronounced on the left than the right. Electronically Signed   By: Nelson Chimes M.D.   On: 09/14/2018 16:38    Assessment and Plan:   1. CHF:  Patient is not a candidate for advanced Rx such as milrinone due to overall clinical status and hypotension Would continue iv diuresis as BP tolerates He is massively volume overloaded However this has been limited by hypotension requiring albumin and pressors Limited echo to re evaluate EF ordered by primary service 2. PE:  Source from [facility] line needs to be removed and new access achieved. On heparin now 3. GI bleed post epinephrine injection heparin restarted with no bolus Hct 29.5  4. Abnormal ECG :  Troponin only .06 doubt large silent MI Decreased EF likely more from sepsis and hypotension  ECG no acute ST elevation and he is not a candidate for catheterization due to inability to use multiple anticoagulants For stenting   Cardiology really has nothing to offer patient  Continue iv diuresis , remove PIC continue heparin and hope he has No further bleeding issues. Agree with palliative care approach  [facility] will sign off.   Medication Recommendations:  See above  Other recommendations (labs, testing, etc):  None  Follow up as an outpatient:  None   For questions or updates, please contact [facility] Please consult [email or web address] for contact info under     Signed, Jenkins Rouge, MD  09/15/2018 8:00 AM

## 2018-09-15 NOTE — Progress Notes (Signed)
Physical Therapy Discharge Patient Details Name: [name] MRN: [id number] DOB: 1941/11/06 Today's Date: 09/15/2018 Time:  -     Patient discharged from PT services secondary to medical decline - will need to re-order PT to resume therapy services.  Please see latest therapy progress note for current level of functioning and progress toward goals.    PT has not been able to work with patient since 9/8 due to medical issues. Today, BP remains low per RN      [name] [place] Pager [phone] Office [phone]  09/15/2018, 12:39 PM

## 2018-09-15 NOTE — Progress Notes (Signed)
Pt educated on importance of wearing cpap, but he still refused for both his nurse and this Probation officer.  Pt was encouraged to call should he change his mind.  RN aware.

## 2018-09-15 NOTE — Progress Notes (Signed)
ANTICOAGULATION CONSULT NOTE - Follow Up Consult  Pharmacy Consult for Heparin - No Bolus Indication: pulmonary embolus and DVT  Allergies  Allergen Reactions  . Ace Inhibitors Cough    With Lisinopril.  . Angiotensin Receptor Blockers Other (See Comments)    Caused excessive weight gain  . Metformin Diarrhea    Patient Measurements: Height: 6' (182.9 cm) Weight: (!) 325 lb 9.9 oz (147.7 kg) IBW/kg (Calculated) : 77.6 Heparin Dosing Weight: 112 kg  Vital Signs: Temp: 97 F (36.1 C) (09/17 0354) Temp Source: Axillary (09/17 0354) BP: 94/52 (09/17 0500) Pulse Rate: 74 (09/17 0500)  Labs: Recent Labs    09/13/18 0648 09/13/18 2049 09/14/18 0131 09/14/18 0132 09/14/18 1549 09/14/18 1715 09/14/18 2034 09/15/18 0234 09/15/18 0254 09/15/18 0300  HGB 9.0* 8.6*  --  8.7*  --   --   --   --   --  9.2*  HCT 28.8* 27.8*  --  27.4*  --   --   --   --   --  29.5*  PLT 187 177  --  193  --   --   --   --   --  206  APTT  --   --   --   --   --  40*  --   --   --   --   LABPROT  --   --   --   --   --  14.8  --   --   --   --   INR  --   --   --   --   --  1.17  --   --   --   --   HEPARINUNFRC  --   --   --   --   --   --   --   --   --  0.10*  CREATININE 0.80  --  1.15  --   --   --   --   --  0.97  --   CKTOTAL  --   --   --   --  41*  --   --   --   --   --   TROPONINI  --   --   --   --  0.07*  --  0.06* 0.06*  --   --     Estimated Creatinine Clearance: 93.7 mL/min (by C-G formula based on SCr of 0.97 mg/dL).   Medications:  Infusions:  . dextrose 10 mL/hr at 09/15/18 0400  . heparin 1,100 Units/hr (09/15/18 0448)  . norepinephrine (LEVOPHED) Adult infusion 3 mcg/min (09/15/18 0444)    Assessment: Pharmacy has been consulted to dose and monitor heparin drip in this 77 year old male for PE/DVT.  Patient has had a prolonged hospitalization, admitted on 8/21 for GI bleed, then complicated by a PE.  He was started on heparin drip and tolerated it, but after being  converted to apixaban on 9/7, patient developed GI bleeding requiring Andexxa reversal and embolization and has been off of anticoagulation since that time. Heparin drip being resumed at this time for recent PE and new RUE DVT.   Discussed plan for heparin drip with CCM MD: NO BOLUSES, will target lower end of therapeutic range of 0.3 - 0.5, will check CBC q8h initially  Today, 09/15/2018:  Heparin level 0.15, remains subtherapeutic on heparin at 1100 units/hr  CBC:  Hgb 9.2 > 9.7, Plt 206 > 185  No bleeding or complications reported   Goal of Therapy:  Heparin level 0.3-0.5 units/ml (Low goal d/t hx GIB) Monitor platelets by anticoagulation protocol: Yes   Plan:  NO Heparin bolus  Increase to heparin IV infusion at 1300 units/hr Heparin level 8 hours after rate change Daily heparin level and CBC Continue to monitor H&H and platelets   Gretta Arab PharmD, BCPS Pager [phone] 09/15/2018 7:43 AM

## 2018-09-15 NOTE — Progress Notes (Signed)
  Echocardiogram 2D Echocardiogram has been performed.  [name] 09/15/2018, 2:41 PM

## 2018-09-15 NOTE — Consult Note (Signed)
   [facility] CM Inpatient Consult   09/15/2018  [name] [birth year] [id number]    [facility] Care Management follow up.   Chart reviewed. Noted [name] is remains in ICU.  Will continue to follow for disposition planning and progression.    Marthenia Rolling, MSN-Ed, RN,BSN [facility] Liaison [phone]

## 2018-09-15 NOTE — Progress Notes (Signed)
Nutrition Follow-up  DOCUMENTATION CODES:   Non-severe (moderate) malnutrition in context of chronic illness, Obesity unspecified  INTERVENTION:  - Will d/c Ensure Enlive per patient preference. - Continue Magic Cup BID.  - Continue Prostat BID. - Will order Boost Breeze BID, each supplement provides 250 kcal and 9 grams of protein. - Continue to encourage PO intakes.    NUTRITION DIAGNOSIS:   Moderate Malnutrition related to chronic illness(chronic osteomyelitis) as evidenced by mild fat depletion, moderate muscle depletion. -ongoing  GOAL:   Patient will meet greater than or equal to 90% of their needs -unmet  MONITOR:   PO intake, Supplement acceptance, Weight trends, Labs, Skin  ASSESSMENT:   77 yo male who was admitted due to melena and hypotension, hypovolemic shock d/t acute blood loss anemia complicated by AKI. He has PMH significant for He chronic osteomyelitis, CHF, type 2 DM, GERD, hiatal hernia, and HTN.  He was currently treated for C. difficile colitis, and noticed at home melanotic stools. Recent hospitalization for sepsis and cardiac arrest 07/31/18.  Weight has been fluctuating throughout admission. Patient last seen by a RD on 9/12 and no intakes documented since that time. Lunch tray on bedside table and untouched. Patient denies abdominal pain or nausea today or during the past few days. He reports eating a late breakfast of chicken and mashed potatoes with gravy so that he is not feeling hungry at this time.   He has tried Ensure twice in the past and both times experienced diarrhea so he prefers not to receive this supplement. He has been taking Prostat and Magic Cup supplements. He also enjoys popsicles. Encouraged patient to eat something at each meal and to consume supplements. Talked with him about the need for adequate protein for wound healing.    Medications reviewed; 20 mg IV Lasix TID, 50 mg Solu-cortef TID, sliding scale Novolog, daily multivitamin  with minerals, 40 mg oral Protonix BID, 40 mEq K-Dur BID, 1 g Carafate TID.  Labs reviewed; CBGs: 140, 124, and 87 mg/dL today, Ca: 7.9 mg/dL, Alk Phos elevated.  IVF; D5 @ 10 mL/hr (41 kcal).    Diet Order:   Diet Order            DIET SOFT Room service appropriate? Yes; Fluid consistency: Thin  Diet effective now        Diet - low sodium heart healthy              EDUCATION NEEDS:   No education needs have been identified at this time  Skin:  Skin Assessment: Skin Integrity Issues: Skin Integrity Issues:: Unstageable Stage II: L heel, L thigh, coccyx, sacrum Unstageable: full thickness to R toe  Last BM:  9/15  Height:   Ht Readings from Last 1 Encounters:  09/10/18 6' (1.829 m)    Weight:   Wt Readings from Last 1 Encounters:  09/15/18 (!) 147.7 kg    Ideal Body Weight:  80.91 kg  BMI:  Body mass index is 44.16 kg/m.  Estimated Nutritional Needs:   Kcal:  4098-1191 (15-17 kcal/kg)  Protein:  120-130 grams  Fluid:  >/= 2 L/day     Jarome Matin, MS, RD, LDN, [facility] Inpatient Clinical Dietitian Pager # [phone] After hours/weekend pager # [phone]

## 2018-09-15 NOTE — Progress Notes (Signed)
ANTICOAGULATION CONSULT NOTE - Follow Up Consult  Pharmacy Consult for Heparin Indication: pulmonary embolus and DVT  Allergies  Allergen Reactions  . Ace Inhibitors Cough    With Lisinopril.  . Angiotensin Receptor Blockers Other (See Comments)    Caused excessive weight gain  . Metformin Diarrhea    Patient Measurements: Height: 6' (182.9 cm) Weight: (!) 325 lb 9.9 oz (147.7 kg) IBW/kg (Calculated) : 77.6 Heparin Dosing Weight:   Vital Signs: Temp: 98.9 F (37.2 C) (09/17 1852) Temp Source: Oral (09/17 1852) BP: 137/52 (09/17 2100) Pulse Rate: 74 (09/17 2100)  Labs: Recent Labs    09/13/18 9417  09/14/18 0131  09/14/18 1549 09/14/18 1715 09/14/18 2034 09/15/18 0234 09/15/18 0254 09/15/18 0300 09/15/18 1208 09/15/18 1225 09/15/18 1900 09/15/18 2106  HGB 9.0*   < >  --    < >  --   --   --   --   --  9.2* 9.7*  --  9.3*  --   HCT 28.8*   < >  --    < >  --   --   --   --   --  29.5* 30.4*  --  29.7*  --   PLT 187   < >  --    < >  --   --   --   --   --  206 185  --  200  --   APTT  --   --   --   --   --  40*  --   --   --   --   --   --   --   --   LABPROT  --   --   --   --   --  14.8  --   --   --   --   --   --   --   --   INR  --   --   --   --   --  1.17  --   --   --   --   --   --   --   --   HEPARINUNFRC  --   --   --   --   --   --   --   --   --  0.10*  --  0.15*  --  0.29*  CREATININE 0.80  --  1.15  --   --   --   --   --  0.97  --   --   --   --   --   CKTOTAL  --   --   --   --  41*  --   --   --   --   --   --   --   --   --   TROPONINI  --   --   --   --  0.07*  --  0.06* 0.06*  --   --   --   --   --   --    < > = values in this interval not displayed.    Estimated Creatinine Clearance: 93.7 mL/min (by C-G formula based on SCr of 0.97 mg/dL).   Medications:  Infusions:  . dextrose 10 mL/hr at 09/15/18 1850  . heparin 1,300 Units/hr (09/15/18 1850)  . norepinephrine (LEVOPHED) Adult infusion 5 mcg/min (09/15/18 1850)  . norepinephrine  (LEVOPHED) Adult infusion 4 mcg/min (09/15/18 2131)  Assessment: Patient with heparin level just below goal.  No heparin issues per RN.  Goal of Therapy:  Heparin level 0.3-0.5 units/ml Monitor platelets by anticoagulation protocol: Yes   Plan:  Increase heparin to 1400 units/hr Recheck level at [place], Shea Stakes [place] 09/15/2018,10:17 PM

## 2018-09-15 NOTE — Progress Notes (Addendum)
**This note was inadvertently addended on 9/18, please reference progress note dated 09/16/2018 by Garner Nash, DO for complete thoughts and recommendations**     NAME:  [name], MRN:  [id number], DOB:  [birth year], LOS: 34 ADMISSION DATE:  08/19/2018, CONSULTATION DATE:  07/31/18 REFERRING MD:  Dr. Wendy Poet, CHIEF COMPLAINT:  Cardiac Arrest   Brief History   77 y/o M admitted 8/21 with reports of black/tarry stools and hypotension.    The patient has had a prolonged course of illness that began 7/30 with sepsis in the setting of UTI, PNA & soft tissue infection of LE chronic venous stasis.  He was hospitalized from 7/30-8/8.  That hospital course complicated by AKI, heart failure with volume overload & brief cardiac arrest (8/2) in the setting of hypoxemia.    He returned 8/21 with reports of dark tarry stools & hypotension.  On admit, he was being treated for C-Diff colitis (?timing of dx). Volume resuscitated with PRBC.  GI consulted & pt felt to have subacute GIB.  The patient declined EDG.  The patient met with Palliative Care & changed to LCB (no CPR, intubation). Declined 8/28 with hemorrhagic shock & was transferred to ICU for EGD which showed large duodenal bulb ulcer with visible vessel s/p epi, cautery.  Received Andexxa for Eliquis reversal.  He further developed hypoxic respiratory failure on 9/4 in the setting of PE and was treated with anticoagulation.  On 9/7 he had recurrent GIB with shock requiring embolization by IR.  Overnight, 9/15 he developed hypotension and was treated with neosynephrine.  BP improved with reduction of neosynephrine 9/16.    PMH   HFrEF (LVEF 30%), decubitus ulcers, chronic venous insufficiency, DM, HTN, Anemia, HLD, GERD, prostate adenocarcinoma  Significant Hospital Events   8/21  Admit with dark tarry stool, hypotension  8/23  To [facility] 8/27  LCB established 8/28  GIB, PCCM called back for hemorrhagic shock 8/29  EGD with large duodenal ulcer,  visible vessel s/p epi, cautery  9/04  Hypoxic resp fx in setting of PE.  Rx with heparin 9/07  GIB, hemorrhagic shock, s/p IR embolization  9/09  Off vasopressors 9/15  Hypotension, EF 30%, transitioned off neo to levo  9/16  Started heparin ggt, close bleeding obs  9/17  Still on low dose levo 9/18  off levo, elevated WBC   Consults: date of consult/date signed off & final recs:  [facility]  IR Cardiology 09/15/2018: Nothing more to offer except diuresis   Procedures (surgical and bedside):  RUE PICC 8/30 >>  Significant Diagnostic Tests:  EGD 8/29 >> large duodenal ulcer with visible vessel, biopsy c/w chronic gastritis, focal inflammation  CTA Chest 9/4 >> positive for PE in segmental branches of LUL EGD 9/10 >> no bleeding LE Doppler 9/9 >> negative   Micro Data: C Diff PCR 8/21 >> antigen positive, toxin negative  UC 8/21 >> multiple species, recollection suggested BCx2 8/21 >> negative  Antimicrobials:   Subjective:   Doing well this morning.  No complaints.  States that he feels much better before.  No sign of bleeding.  Says stools are formed.  Objective   Blood pressure (!) 94/52, pulse 74, temperature (!) 97 F (36.1 C), temperature source Axillary, resp. rate (!) 24, height 6' (1.829 m), weight (!) 147.7 kg, SpO2 100 %. CVP:  [10 mmHg-11 mmHg] 11 mmHg      Intake/Output Summary (Last 24 hours) at 09/15/2018 0801 Last data filed at 09/15/2018 0400  Gross per 24 hour  Intake 1215.93 ml  Output 2235 ml  Net -1019.07 ml   Filed Weights   09/13/18 0300 09/14/18 0600 09/15/18 0500  Weight: (!) 149 kg (!) 150.4 kg (!) 147.7 kg    Examination: General appearance: 77 y.o., male, NAD, conversant  Eyes: anicteric sclerae, moist conjunctivae; pupils reactive, tracking appropriately HENT: NCAT; oropharynx, MMM Neck: Trachea midline; visible JVP at anterior lower neck Lungs: Diminished bases bilaterally, no crackles, no wheeze CV: RRR, S1, S2, distant heart  tones Abdomen: Soft, non-tender; non-distended, obese pannus Extremities: Bilateral edema wrapped in Coban dressings Skin: Normal temperature, turgor and texture; no rash Psych: Appropriate affect, normal mood Neuro: Alert and oriented to person and place, no focal deficit   Resolved Hospital Problem list     Assessment & Plan:   Shock, hypotension, resolved, unclear etiology, no evidence of sepsis, off low-dose Levophed this time, repeat echocardiogram with normal ejection fraction, normal right-sided ventricular function Random serum cortisol 10, questionable relative adrenal insufficiency, of note, there is no consensus on diagnosis of the critically. Possibly over diuresis and medication effect.  Elevated white blood cell count, no fevers P: Remain off norepinephrine at this time, orders discontinued, goal mean arterial pressure greater than 60 due to low diastolic pressure readings.  Goal systolic blood pressure greater than 100, with appropriate mentation good urine output is We appreciate cardiology's recommendations. Continue heparin for known pulmonary embolism and upper extremity DVT Will monitor for any signs of sepsis.  Low threshold for starting antimicrobial's patient is clinically improved and stable. Additionally he has no loose stools. Will check RUQ [place], repeat hepatic function panel, at risk for acalculous chole?   Echo was completed prior to post cardiac arrest with reduced ejection fraction, now has improved with repeat 2D limited echo demonstrating normal EF Possible myocardial stunning postarrest Hx Loletha Grayer, SVT P: Continue diuresis We will monitor  Acute on Chronic Hypercarbic Respiratory Failure Acute left-sided pulmonary embolism, high risk for complications secondary to comorbidities High risk PESI Right upper extremity VTE Hx OSA on CPAP nightly Pulmonary vascular congestion, pulmonary edema, much improved Bilateral pleural effusion, small  improving P: Maintain goal SPO2 greater than 90% Nocturnal CPAP continue Continue heparin  Ptt monitoring per pharmacy   AKI, acute renal failure, creatinine stable, improved throughout admission Hypomagnesemia P: Replace electrolytes, goal potassium 4, goal magnesium greater than 2 Follow BMP daily urine output Avoid nephrotoxic drugs  Upper GIB with Hemorrhagic Shock, resolved - Recent duodenal ulcar, 9/10 EGD negative for bleeding - Status post cautery for hemostasis, this failed followed by IR embolization - Was on Eliquis status post Andexxa reversal - Discussed with GI yesterday states risk of rebleed on starting anticoagulation approximately 30% P: Follow CBC closely while on heparin due to history of GI bleeding Appears stable at this time  Recent Treatment for C-Diff  P: Monitor stool output   Moderate Protein Calorie Malnutrition  P: Ensure twice daily, pro-stat  BLE Venous Insufficiency, chronic venous stasis P: Coban dressings Wound care   Hx Prostate Cancer  P: Outpt fu   Disposition / Summary of Today's Plan 09/15/18   ICU, wean pressors.  Work up source of hypotension.     Diet: PO Pain/Anxiety/Delirium protocol (if indicated): n/a VAP protocol (if indicated) n/a DVT prophylaxis: SCD's, heparin ggt  GI prophylaxis: PPI  Hyperglycemia protocol: SSI, resistant scale  Mobility: As tolerated, PT consult Code Status: LCB  Family Communication:discussed with daughter yesterday   Labs   CBC: Recent  Labs  Lab 09/10/18 0431 09/11/18 0339  09/14/18 0132 09/15/18 0300 09/15/18 1208 09/15/18 1900 09/16/18 0300  WBC 10.2 8.5   < > 9.5 5.9 28.5* 32.1* 29.1*  NEUTROABS 8.8* 6.4  --   --   --   --   --   --   HGB 9.5* 8.9*   < > 8.7* 9.2* 9.7* 9.3* 9.5*  HCT 31.9* 29.5*   < > 27.4* 29.5* 30.4* 29.7* 30.4*  MCV 99.1 97.7   < > 94.5 94.2 92.7 92.5 93.5  PLT 169 184   < > 193 206 185 200 202   < > = values in this interval not displayed.   Basic  Metabolic Panel: Recent Labs  Lab 09/10/18 0431 09/11/18 0339 09/12/18 0627 09/13/18 0648 09/14/18 0131 09/15/18 0254  NA 150* 150* 146* 146* 143 138  K 4.5 4.2 3.3* 3.8 4.1 4.4  CL 114* 112* 106 107 105 99  CO2 31 32 34* 33* 32 33*  GLUCOSE 142* 91 91 107* 75 249*  BUN _0 CREATININE 0.66 0.70 0.79 0.80 1.15 0.97  CALCIUM 9.0 8.5* 8.3* 8.0* 8.1* 7.9*  MG 1.9 1.6*  --   --   --   --   PHOS 3.1 2.5  --   --   --   --    GFR: Estimated Creatinine Clearance: 93.7 mL/min (by C-G formula based on SCr of 0.97 mg/dL). Recent Labs  Lab 09/13/18 0648 09/13/18 2049 09/14/18 0132 09/14/18 1549 09/15/18 0254 09/15/18 0300  PROCALCITON  --   --   --  0.27 0.23  --   WBC 11.0* 8.6 9.5  --   --  5.9   Liver Function Tests: Recent Labs  Lab 09/14/18 1549 09/15/18 0254  AST 55* 50*  ALT 34 40  ALKPHOS 186* 198*  BILITOT 0.8 0.8  PROT 5.3* 5.5*  ALBUMIN 2.0* 2.0*   No results for input(s): LIPASE, AMYLASE in the last 168 hours. No results for input(s): AMMONIA in the last 168 hours.   ABG    Component Value Date/Time   PHART 7.392 09/14/2018 0615   PCO2ART 53.6 (H) 09/14/2018 0615   PO2ART 74.0 (L) 09/14/2018 0615   HCO3 32.0 (H) 09/14/2018 0615   TCO2 35 (H) 08/19/2018 1527   ACIDBASEDEF 0.3 07/31/2018 1121   O2SAT 61.6 09/14/2018 1500    Coagulation Profile: Recent Labs  Lab 09/14/18 1715  INR 1.17     Cardiac Enzymes: Recent Labs  Lab 09/14/18 1549 09/14/18 2034 09/15/18 0234  CKTOTAL 41*  --   --   TROPONINI 0.07* 0.06* 0.06*   HbA1C: Hgb A1c MFr Bld  Date/Time Value Ref Range Status  05/19/2018 08:48 AM 9.3 (H) 4.8 - 5.6 % Final    Comment:    (NOTE) Pre diabetes:          5.7%-6.4% Diabetes:              >6.4% Glycemic control for   <7.0% adults with diabetes   03/15/2017 05:51 AM 14.3 (H) 4.8 - 5.6 % Final    Comment:    (NOTE)         Pre-diabetes: 5.7 - 6.4         Diabetes: >6.4         Glycemic control for adults with  diabetes: <7.0    CBG: Recent Labs  Lab 09/14/18 0748 09/14/18 1230 09/14/18 1632 09/14/18 2223 09/15/18 0034  GLUCAP  110* 105* 110* 160* Bright, DO [facility] 09/15/2018 8:01 AM  Personal pager: [phone] If unanswered, please page CCM On-call: [phone]

## 2018-09-15 NOTE — Progress Notes (Signed)
ANTICOAGULATION CONSULT NOTE - Follow Up Consult  Pharmacy Consult for Heparin Indication: pulmonary embolus and DVT  Allergies  Allergen Reactions  . Ace Inhibitors Cough    With Lisinopril.  . Angiotensin Receptor Blockers Other (See Comments)    Caused excessive weight gain  . Metformin Diarrhea    Patient Measurements: Height: 6' (182.9 cm) Weight: (!) 331 lb 9.2 oz (150.4 kg) IBW/kg (Calculated) : 77.6 Heparin Dosing Weight:   Vital Signs: Temp: 97 F (36.1 C) (09/17 0354) Temp Source: Axillary (09/17 0354) BP: 95/50 (09/17 0300) Pulse Rate: 58 (09/17 0300)  Labs: Recent Labs    09/12/18 0627  09/13/18 7564 09/13/18 2049 09/14/18 0131 09/14/18 0132 09/14/18 1549 09/14/18 1715 09/14/18 2034 09/15/18 0234 09/15/18 0300  HGB  --    < > 9.0* 8.6*  --  8.7*  --   --   --   --  9.2*  HCT  --    < > 28.8* 27.8*  --  27.4*  --   --   --   --  29.5*  PLT  --    < > 187 177  --  193  --   --   --   --  206  APTT  --   --   --   --   --   --   --  40*  --   --   --   LABPROT  --   --   --   --   --   --   --  14.8  --   --   --   INR  --   --   --   --   --   --   --  1.17  --   --   --   HEPARINUNFRC  --   --   --   --   --   --   --   --   --   --  0.10*  CREATININE 0.79  --  0.80  --  1.15  --   --   --   --   --   --   CKTOTAL  --   --   --   --   --   --  41*  --   --   --   --   TROPONINI  --   --   --   --   --   --  0.07*  --  0.06* 0.06*  --    < > = values in this interval not displayed.    Estimated Creatinine Clearance: 79.9 mL/min (by C-G formula based on SCr of 1.15 mg/dL).   Medications:  Infusions:  . dextrose 10 mL/hr at 09/15/18 0300  . heparin 1,000 Units/hr (09/15/18 0300)  . norepinephrine (LEVOPHED) Adult infusion 2 mcg/min (09/15/18 0300)    Assessment: Patient with low heparin level even with reduced goal of 0.3-0.5.  No heparin or bleeding issues per RN.  Goal of Therapy:  Heparin level 0.3-0.5 units/ml Monitor platelets by  anticoagulation protocol: Yes   Plan:  Increase heparin to 1100 units/hr  Recheck level at [place] 09/15/2018,4:38 AM

## 2018-09-16 ENCOUNTER — Inpatient Hospital Stay (HOSPITAL_COMMUNITY): Payer: PPO

## 2018-09-16 ENCOUNTER — Encounter (HOSPITAL_COMMUNITY): Payer: Self-pay | Admitting: Pulmonary Disease

## 2018-09-16 DIAGNOSIS — R109 Unspecified abdominal pain: Secondary | ICD-10-CM

## 2018-09-16 DIAGNOSIS — D72829 Elevated white blood cell count, unspecified: Secondary | ICD-10-CM

## 2018-09-16 DIAGNOSIS — R748 Abnormal levels of other serum enzymes: Secondary | ICD-10-CM

## 2018-09-16 DIAGNOSIS — R0602 Shortness of breath: Secondary | ICD-10-CM

## 2018-09-16 LAB — HEPATIC FUNCTION PANEL
ALBUMIN: 1.9 g/dL — AB (ref 3.5–5.0)
ALK PHOS: 191 U/L — AB (ref 38–126)
ALT: 33 U/L (ref 0–44)
AST: 28 U/L (ref 15–41)
Bilirubin, Direct: 0.3 mg/dL — ABNORMAL HIGH (ref 0.0–0.2)
Indirect Bilirubin: 0.5 mg/dL (ref 0.3–0.9)
TOTAL PROTEIN: 5.7 g/dL — AB (ref 6.5–8.1)
Total Bilirubin: 0.8 mg/dL (ref 0.3–1.2)

## 2018-09-16 LAB — URINALYSIS, ROUTINE W REFLEX MICROSCOPIC
BILIRUBIN URINE: NEGATIVE
Glucose, UA: NEGATIVE mg/dL
KETONES UR: NEGATIVE mg/dL
LEUKOCYTES UA: NEGATIVE
NITRITE: NEGATIVE
Protein, ur: NEGATIVE mg/dL
SPECIFIC GRAVITY, URINE: 1.005 (ref 1.005–1.030)
pH: 5 (ref 5.0–8.0)

## 2018-09-16 LAB — CBC
HCT: 30.4 % — ABNORMAL LOW (ref 39.0–52.0)
HCT: 32.8 % — ABNORMAL LOW (ref 39.0–52.0)
HEMOGLOBIN: 9.5 g/dL — AB (ref 13.0–17.0)
HEMOGLOBIN: 9.9 g/dL — AB (ref 13.0–17.0)
MCH: 29.2 pg (ref 26.0–34.0)
MCH: 28.9 pg (ref 26.0–34.0)
MCHC: 31.3 g/dL (ref 30.0–36.0)
MCHC: 30.2 g/dL (ref 30.0–36.0)
MCV: 93.5 fL (ref 78.0–100.0)
MCV: 95.6 fL (ref 78.0–100.0)
PLATELETS: 202 10*3/uL (ref 150–400)
PLATELETS: 159 10*3/uL (ref 150–400)
RBC: 3.25 MIL/uL — AB (ref 4.22–5.81)
RBC: 3.43 MIL/uL — AB (ref 4.22–5.81)
RDW: 15.2 % (ref 11.5–15.5)
RDW: 15 % (ref 11.5–15.5)
WBC: 29.1 10*3/uL — ABNORMAL HIGH (ref 4.0–10.5)
WBC: 31.7 10*3/uL — ABNORMAL HIGH (ref 4.0–10.5)

## 2018-09-16 LAB — GLUCOSE, CAPILLARY
GLUCOSE-CAPILLARY: 153 mg/dL — AB (ref 70–99)
Glucose-Capillary: 211 mg/dL — ABNORMAL HIGH (ref 70–99)
Glucose-Capillary: 169 mg/dL — ABNORMAL HIGH (ref 70–99)
Glucose-Capillary: 162 mg/dL — ABNORMAL HIGH (ref 70–99)

## 2018-09-16 LAB — HEPARIN LEVEL (UNFRACTIONATED)
HEPARIN UNFRACTIONATED: 0.38 [IU]/mL (ref 0.30–0.70)
HEPARIN UNFRACTIONATED: 0.4 [IU]/mL (ref 0.30–0.70)

## 2018-09-16 LAB — PROCALCITONIN: PROCALCITONIN: 8.73 ng/mL

## 2018-09-16 MED ORDER — FUROSEMIDE 10 MG/ML IJ SOLN
20.0000 mg | Freq: Two times a day (BID) | INTRAMUSCULAR | Status: DC
  Administered 2018-09-16 – 2018-09-17 (×4): 20 mg via INTRAVENOUS
  Filled 2018-09-16 (×4): qty 2

## 2018-09-16 MED ORDER — VITAMIN B-1 100 MG PO TABS
100.0000 mg | ORAL_TABLET | Freq: Every day | ORAL | Status: DC
  Administered 2018-09-16 – 2018-09-21 (×6): 100 mg via ORAL
  Filled 2018-09-16 (×6): qty 1

## 2018-09-16 MED ORDER — HYDROCORTISONE 20 MG PO TABS
20.0000 mg | ORAL_TABLET | Freq: Every day | ORAL | Status: DC
  Administered 2018-09-16 – 2018-09-21 (×6): 20 mg via ORAL
  Filled 2018-09-16 (×7): qty 1

## 2018-09-16 MED ORDER — MAGNESIUM SULFATE 2 GM/50ML IV SOLN
2.0000 g | Freq: Once | INTRAVENOUS | Status: AC
  Administered 2018-09-16: 2 g via INTRAVENOUS
  Filled 2018-09-16: qty 50

## 2018-09-16 NOTE — Progress Notes (Addendum)
09/16/18  2013  Notified midlevel of BP 90/39 and repeat BP 90/45 Map 59. Patient sleeping. Will continue to monitor.  09/16/18  2027 Notified [facility]Link provider of pt's BP 90/39 and repeat BP 90/45 Map 59 while sleeping and 102/65 Map 76 with arousal. Per MD BP's are ok. No new orders given.

## 2018-09-16 NOTE — Progress Notes (Signed)
Patient is very hard lab stick. All labs drawn except CBC. Both arms swollen,left hand cool to touch. MD is aware.

## 2018-09-16 NOTE — Progress Notes (Addendum)
NAME:  [name], MRN:  [id number], DOB:  1941/03/12, LOS: 62 ADMISSION DATE:  08/19/2018, CONSULTATION DATE:  07/31/18 REFERRING MD:  Dr. Wendy Poet, CHIEF COMPLAINT:  Cardiac Arrest   Brief History   77 y/o M admitted 8/21 with reports of black/tarry stools and hypotension.    The patient has had a prolonged course of illness that began 7/30 with sepsis in the setting of UTI, PNA & soft tissue infection of LE chronic venous stasis.  He was hospitalized from 7/30-8/8.  That hospital course complicated by AKI, heart failure with volume overload & brief cardiac arrest (8/2) in the setting of hypoxemia.    He returned 8/21 with reports of dark tarry stools & hypotension.  On admit, he was being treated for C-Diff colitis (?timing of dx). Volume resuscitated with PRBC.  GI consulted & pt felt to have subacute GIB.  The patient declined EDG.  The patient met with Palliative Care & changed to LCB (no CPR, intubation). Declined 8/28 with hemorrhagic shock & was transferred to ICU for EGD which showed large duodenal bulb ulcer with visible vessel s/p epi, cautery.  Received Andexxa for Eliquis reversal.  He further developed hypoxic respiratory failure on 9/4 in the setting of PE and was treated with anticoagulation.  On 9/7 he had recurrent GIB with shock requiring embolization by IR.  Overnight, 9/15 he developed hypotension and was treated with neosynephrine.  BP improved with reduction of neosynephrine 9/16.    PMH   HFrEF (LVEF 30%), decubitus ulcers, chronic venous insufficiency, DM, HTN, Anemia, HLD, GERD, prostate adenocarcinoma  Significant Hospital Events   8/21  Admit with dark tarry stool, hypotension  8/23  To [facility] 8/27  LCB established 8/28  GIB, PCCM called back for hemorrhagic shock 8/29  EGD with large duodenal ulcer, visible vessel s/p epi, cautery  9/04  Hypoxic resp fx in setting of PE.  Rx with heparin 9/07  GIB, hemorrhagic shock, s/p IR embolization  9/09  Off  vasopressors 9/15  Hypotension, EF 30%, transitioned off neo to levo  9/16  Started heparin ggt, close bleeding obs  9/17  Still on low dose levo 9/18  off levo, elevated WBC   Consults: date of consult/date signed off & final recs:  [facility] Cardiology 09/15/2018: Nothing more to offer except diuresis   Procedures (surgical and bedside):  RUE PICC 8/30 >>  Significant Diagnostic Tests:  EGD 8/29 >> large duodenal ulcer with visible vessel, biopsy c/w chronic gastritis, focal inflammation  CTA Chest 9/4 >> positive for PE in segmental branches of LUL EGD 9/10 >> no bleeding LE Doppler 9/9 >> negative   Micro Data: C Diff PCR 8/21 >> antigen positive, toxin negative  UC 8/21 >> multiple species, recollection suggested BCx2 8/21 >> negative  Antimicrobials:   Subjective:   Doing well this morning.  No complaints.  States that he feels much better before.  No sign of bleeding.  Says stools are formed.  Objective   Blood pressure (!) 105/54, pulse 77, temperature (!) 97.5 F (36.4 C), temperature source Axillary, resp. rate (!) 24, height 6' (1.829 m), weight (!) 150.9 kg, SpO2 100 %. CVP:  [14 mmHg-15 mmHg] 14 mmHg      Intake/Output Summary (Last 24 hours) at 09/16/2018 0902 Last data filed at 09/16/2018 0300 Gross per 24 hour  Intake 544.08 ml  Output 2225 ml  Net -1680.92 ml   Filed Weights   09/14/18 0600 09/15/18 0500 09/16/18  0355  Weight: (!) 150.4 kg (!) 147.7 kg (!) 150.9 kg    Examination: General appearance: 77 y.o., male, NAD, conversant  Eyes: anicteric sclerae, moist conjunctivae; pupils reactive, tracking appropriately HENT: NCAT; oropharynx, MMM Neck: Trachea midline; visible JVP at anterior lower neck Lungs: Diminished bases bilaterally, no crackles, no wheeze CV: RRR, S1, S2, distant heart tones Abdomen: Soft, non-tender; non-distended, obese pannus Extremities: Bilateral edema wrapped in Coban dressings Skin: Normal temperature, turgor and  texture; no rash Psych: Appropriate affect, normal mood Neuro: Alert and oriented to person and place, no focal deficit   Resolved Hospital Problem list     Assessment & Plan:   Shock, hypotension, resolved, unclear etiology, no evidence of sepsis, off low-dose Levophed this time, repeat echocardiogram with normal ejection fraction, normal right-sided ventricular function Random serum cortisol 10, questionable relative adrenal insufficiency, of note, there is no consensus on diagnosis of the critically. Possibly over diuresis and medication effect.  Elevated white blood cell count, no fevers P: Remain off norepinephrine at this time, orders discontinued, goal mean arterial pressure greater than 60 due to low diastolic pressure readings.  Goal systolic blood pressure greater than 100, with appropriate mentation good urine output is We appreciate cardiology's recommendations. Continue heparin for known pulmonary embolism and upper extremity DVT Will monitor for any signs of sepsis.  Low threshold for starting antimicrobial's patient is clinically improved and stable. Additionally he has no loose stools. Will check RUQ [place], repeat hepatic function panel, at risk for acalculous chole?   Echo was completed prior to post cardiac arrest with reduced ejection fraction, now has improved with repeat 2D limited echo demonstrating normal EF Possible myocardial stunning postarrest Hx Loletha Grayer, SVT P: Continue diuresis We will monitor  Acute on Chronic Hypercarbic Respiratory Failure Acute left-sided pulmonary embolism, high risk for complications secondary to comorbidities High risk PESI Right upper extremity VTE Hx OSA on CPAP nightly Pulmonary vascular congestion, pulmonary edema, much improved Bilateral pleural effusion, small improving P: Maintain goal SPO2 greater than 90% Nocturnal CPAP continue Continue heparin  Ptt monitoring per pharmacy   AKI, acute renal failure, creatinine stable,  improved throughout admission Hypomagnesemia P: Replace electrolytes, goal potassium 4, goal magnesium greater than 2 Follow BMP daily urine output Avoid nephrotoxic drugs  Upper GIB with Hemorrhagic Shock, resolved - Recent duodenal ulcar, 9/10 EGD negative for bleeding - Status post cautery for hemostasis, this failed followed by IR embolization - Was on Eliquis status post Andexxa reversal - Discussed with GI yesterday states risk of rebleed on starting anticoagulation approximately 30% P: Follow CBC closely while on heparin due to history of GI bleeding Appears stable at this time  Recent Treatment for C-Diff  P: Monitor stool output   Moderate Protein Calorie Malnutrition  P: Ensure twice daily, pro-stat  BLE Venous Insufficiency, chronic venous stasis P: Coban dressings Wound care   Hx Prostate Cancer  P: Outpt fu   Disposition / Summary of Today's Plan 09/16/18   ICU, wean pressors.  Work up source of hypotension.     Diet: PO Pain/Anxiety/Delirium protocol (if indicated): n/a VAP protocol (if indicated) n/a DVT prophylaxis: SCD's, heparin ggt  GI prophylaxis: PPI  Hyperglycemia protocol: SSI, resistant scale  Mobility: As tolerated, PT consult Code Status: LCB  Family Communication:discussed with daughter yesterday   Labs   CBC: Recent Labs  Lab 09/10/18 0431 09/11/18 0339  09/14/18 0132 09/15/18 0300 09/15/18 1208 09/15/18 1900 09/16/18 0300  WBC 10.2 8.5   < > 9.5  5.9 28.5* 32.1* 29.1*  NEUTROABS 8.8* 6.4  --   --   --   --   --   --   HGB 9.5* 8.9*   < > 8.7* 9.2* 9.7* 9.3* 9.5*  HCT 31.9* 29.5*   < > 27.4* 29.5* 30.4* 29.7* 30.4*  MCV 99.1 97.7   < > 94.5 94.2 92.7 92.5 93.5  PLT 169 184   < > 193 206 185 200 202   < > = values in this interval not displayed.   Basic Metabolic Panel: Recent Labs  Lab 09/10/18 0431 09/11/18 0339 09/12/18 0627 09/13/18 0648 09/14/18 0131 09/15/18 0254  NA 150* 150* 146* 146* 143 138  K 4.5 4.2 3.3*  3.8 4.1 4.4  CL 114* 112* 106 107 105 99  CO2 31 32 34* 33* 32 33*  GLUCOSE 142* 91 91 107* 75 249*  BUN '11 9 12 14 18 16  ' CREATININE 0.66 0.70 0.79 0.80 1.15 0.97  CALCIUM 9.0 8.5* 8.3* 8.0* 8.1* 7.9*  MG 1.9 1.6*  --   --   --   --   PHOS 3.1 2.5  --   --   --   --    GFR: Estimated Creatinine Clearance: 94.9 mL/min (by C-G formula based on SCr of 0.97 mg/dL). Recent Labs  Lab 09/14/18 1549 09/15/18 0254 09/15/18 0300 09/15/18 1208 09/15/18 1900 09/16/18 0300  PROCALCITON 0.27 0.23  --   --   --   --   WBC  --   --  5.9 28.5* 32.1* 29.1*   Liver Function Tests: Recent Labs  Lab 09/14/18 1549 09/15/18 0254  AST 55* 50*  ALT 34 40  ALKPHOS 186* 198*  BILITOT 0.8 0.8  PROT 5.3* 5.5*  ALBUMIN 2.0* 2.0*   No results for input(s): LIPASE, AMYLASE in the last 168 hours. No results for input(s): AMMONIA in the last 168 hours.   ABG    Component Value Date/Time   PHART 7.392 09/14/2018 0615   PCO2ART 53.6 (H) 09/14/2018 0615   PO2ART 74.0 (L) 09/14/2018 0615   HCO3 32.0 (H) 09/14/2018 0615   TCO2 35 (H) 08/19/2018 1527   ACIDBASEDEF 0.3 07/31/2018 1121   O2SAT 61.6 09/14/2018 1500    Coagulation Profile: Recent Labs  Lab 09/14/18 1715  INR 1.17     Cardiac Enzymes: Recent Labs  Lab 09/14/18 1549 09/14/18 2034 09/15/18 0234  CKTOTAL 41*  --   --   TROPONINI 0.07* 0.06* 0.06*   HbA1C: Hgb A1c MFr Bld  Date/Time Value Ref Range Status  05/19/2018 08:48 AM 9.3 (H) 4.8 - 5.6 % Final    Comment:    (NOTE) Pre diabetes:          5.7%-6.4% Diabetes:              >6.4% Glycemic control for   <7.0% adults with diabetes   03/15/2017 05:51 AM 14.3 (H) 4.8 - 5.6 % Final    Comment:    (NOTE)         Pre-diabetes: 5.7 - 6.4         Diabetes: >6.4         Glycemic control for adults with diabetes: <7.0    CBG: Recent Labs  Lab 09/15/18 0803 09/15/18 1215 09/15/18 1731 09/15/18 2207 09/16/18 0813  GLUCAP 124* 87 180* 164* 153*    ** This note  was copied from the inadvertent addendum created to the 9/17 note. This is a new note **  Garner Nash, DO [facility] 09/16/2018 9:02 AM  Personal pager: [phone] If unanswered, please page CCM On-call: [phone]

## 2018-09-16 NOTE — Care Management Note (Signed)
Case Management Note  Patient Details  Name: [name] MRN: [id number] Date of Birth: [birth year]  Subjective/Objective:                  Elevated wbc=29.1, hgb 9.5, iv heparin, iv d5w, iv mgs04, c-pap  Action/Plan: Will follow for progression of care and clinical status. Will follow for case management needs none present at this time.  Expected Discharge Date:  08/22/18               Expected Discharge Plan:  Skilled Nursing Facility  In-House Referral:  Clinical Social Work  Discharge planning Services  CM Consult  Post Acute Care Choice:  NA Choice offered to:  NA  DME Arranged:  N/A DME Agency:  NA  HH Arranged:  NA HH Agency:  NA  Status of Service:  Completed, signed off  If discussed at [employer] of Stay Meetings, dates discussed:    Additional Comments:  Leeroy Cha, RN 09/16/2018, 8:47 AM

## 2018-09-16 NOTE — Progress Notes (Signed)
ANTICOAGULATION CONSULT NOTE - Follow Up Consult  Pharmacy Consult for Heparin - No Bolus Indication: pulmonary embolus and DVT  Allergies  Allergen Reactions  . Ace Inhibitors Cough    With Lisinopril.  . Angiotensin Receptor Blockers Other (See Comments)    Caused excessive weight gain  . Metformin Diarrhea    Patient Measurements: Height: 6' (182.9 cm) Weight: (!) 332 lb 10.8 oz (150.9 kg) IBW/kg (Calculated) : 77.6 Heparin Dosing Weight: 112 kg  Vital Signs: Temp: 97.6 F (36.4 C) (09/18 1200) Temp Source: Axillary (09/18 1200) BP: 111/50 (09/18 1400) Pulse Rate: 74 (09/18 1400)  Labs: Recent Labs    09/14/18 0131  09/14/18 1549 09/14/18 1715 09/14/18 2034 09/15/18 0234 09/15/18 0254  09/15/18 1208  09/15/18 1900 09/15/18 2106 09/16/18 0300 09/16/18 0830 09/16/18 1501  HGB  --    < >  --   --   --   --   --    < > 9.7*  --  9.3*  --  9.5*  --   --   HCT  --    < >  --   --   --   --   --    < > 30.4*  --  29.7*  --  30.4*  --   --   PLT  --    < >  --   --   --   --   --    < > 185  --  200  --  202  --   --   APTT  --   --   --  40*  --   --   --   --   --   --   --   --   --   --   --   LABPROT  --   --   --  14.8  --   --   --   --   --   --   --   --   --   --   --   INR  --   --   --  1.17  --   --   --   --   --   --   --   --   --   --   --   HEPARINUNFRC  --   --   --   --   --   --   --    < >  --    < >  --  0.29*  --  0.38 0.40  CREATININE 1.15  --   --   --   --   --  0.97  --   --   --   --   --   --   --   --   CKTOTAL  --   --  41*  --   --   --   --   --   --   --   --   --   --   --   --   TROPONINI  --   --  0.07*  --  0.06* 0.06*  --   --   --   --   --   --   --   --   --    < > = values in this interval not displayed.    Estimated Creatinine Clearance: 94.9 mL/min (by C-G formula based on SCr of  0.97 mg/dL).   Medications:  Infusions:  . dextrose 10 mL/hr at 09/16/18 1022  . heparin 1,400 Units/hr (09/16/18 1230)     Assessment: Pharmacy has been consulted to dose and monitor heparin drip in this 77 year old male for PE/DVT.  Patient has had a prolonged hospitalization, admitted on 8/21 for GI bleed, then complicated by a PE.  He was started on heparin drip and tolerated it, but after being converted to apixaban on 9/7, patient developed GI bleeding requiring Andexxa reversal and embolization and has been off of anticoagulation since that time. Heparin drip was resumed on 9/16 for recent PE and new RUE DVT.   Discussed plan for heparin drip with CCM MD: NO BOLUSES, will target lower end of therapeutic range of 0.3 - 0.5, check CBC q8h initially  Today, 09/16/2018:  Confirmatory heparin level = 0.4 is therapeutic on heparin at 1400 units/hr  CBC:  Hgb 9.5 is low/stable, Plt 202  Per discussion with RN: no issues with or interruptions in heparin infusion, no signs/symptoms of bleeding  PICC line has been pulled  Goal of Therapy:  Heparin level 0.3-0.5 units/ml (Low goal d/t hx GIB) Monitor platelets by anticoagulation protocol: Yes   Plan:  -NO Heparin bolus  -Continue heparin IV infusion at 1400 units/hr -HL and CBC with AM labs tomorrow -Daily heparin level and CBC -Continue to monitor H&H and platelets and monitor for signs/symptoms of bleeding  Lenis Noon, PharmD, BCPS Clinical Pharmacist 09/16/18 4:25 PM

## 2018-09-16 NOTE — Progress Notes (Signed)
ANTICOAGULATION CONSULT NOTE - Follow Up Consult  Pharmacy Consult for Heparin - No Bolus Indication: pulmonary embolus and DVT  Allergies  Allergen Reactions  . Ace Inhibitors Cough    With Lisinopril.  . Angiotensin Receptor Blockers Other (See Comments)    Caused excessive weight gain  . Metformin Diarrhea    Patient Measurements: Height: 6' (182.9 cm) Weight: (!) 332 lb 10.8 oz (150.9 kg) IBW/kg (Calculated) : 77.6 Heparin Dosing Weight: 112 kg  Vital Signs: Temp: 97.9 F (36.6 C) (09/18 0400) Temp Source: Oral (09/18 0400) BP: 105/54 (09/18 0600) Pulse Rate: 77 (09/18 0600)  Labs: Recent Labs    09/14/18 0131  09/14/18 1549 09/14/18 1715 09/14/18 2034 09/15/18 0234 09/15/18 0254 09/15/18 0300 09/15/18 1208 09/15/18 1225 09/15/18 1900 09/15/18 2106 09/16/18 0300  HGB  --    < >  --   --   --   --   --  9.2* 9.7*  --  9.3*  --  9.5*  HCT  --    < >  --   --   --   --   --  29.5* 30.4*  --  29.7*  --  30.4*  PLT  --    < >  --   --   --   --   --  206 185  --  200  --  202  APTT  --   --   --  40*  --   --   --   --   --   --   --   --   --   LABPROT  --   --   --  14.8  --   --   --   --   --   --   --   --   --   INR  --   --   --  1.17  --   --   --   --   --   --   --   --   --   HEPARINUNFRC  --   --   --   --   --   --   --  0.10*  --  0.15*  --  0.29*  --   CREATININE 1.15  --   --   --   --   --  0.97  --   --   --   --   --   --   CKTOTAL  --   --  41*  --   --   --   --   --   --   --   --   --   --   TROPONINI  --   --  0.07*  --  0.06* 0.06*  --   --   --   --   --   --   --    < > = values in this interval not displayed.    Estimated Creatinine Clearance: 94.9 mL/min (by C-G formula based on SCr of 0.97 mg/dL).   Medications:  Infusions:  . dextrose 10 mL/hr at 09/15/18 1850  . heparin 1,400 Units/hr (09/15/18 2305)  . norepinephrine (LEVOPHED) Adult infusion 2 mcg/min (09/16/18 0530)    Assessment: Pharmacy has been consulted to dose  and monitor heparin drip in this 77 year old male for PE/DVT.  Patient has had a prolonged hospitalization, admitted on 8/21 for GI bleed, then complicated by a PE.  He was started on heparin drip and tolerated it, but after being converted to apixaban on 9/7, patient developed GI bleeding requiring Andexxa reversal and embolization and has been off of anticoagulation since that time. Heparin drip being resumed at this time for recent PE and new RUE DVT.   Discussed plan for heparin drip with CCM MD: NO BOLUSES, will target lower end of therapeutic range of 0.3 - 0.5, will check CBC q8h initially  Today, 09/16/2018:  Heparin level 0.38 is therapeutic on heparin at 1400 units/hr  CBC:  Hgb 9.5 is low/stable, Plt 202  No bleeding or complications reported   Goal of Therapy:  Heparin level 0.3-0.5 units/ml (Low goal d/t hx GIB) Monitor platelets by anticoagulation protocol: Yes   Plan:  NO Heparin bolus  Continue heparin IV infusion at 1400 units/hr Heparin level in 8 hours to confirm Daily heparin level and CBC Continue to monitor H&H and platelets   Gretta Arab PharmD, BCPS Pager [phone] 09/16/2018 7:43 AM

## 2018-09-16 NOTE — Plan of Care (Signed)
  Problem: Nutrition: Goal: Adequate nutrition will be maintained Outcome: Progressing   Problem: Safety: Goal: Ability to remain free from injury will improve Outcome: Progressing   Problem: Elimination: Goal: Will not experience complications related to bowel motility Outcome: Progressing   

## 2018-09-17 DIAGNOSIS — E440 Moderate protein-calorie malnutrition: Secondary | ICD-10-CM

## 2018-09-17 LAB — GLUCOSE, CAPILLARY
GLUCOSE-CAPILLARY: 83 mg/dL (ref 70–99)
Glucose-Capillary: 176 mg/dL — ABNORMAL HIGH (ref 70–99)
Glucose-Capillary: 113 mg/dL — ABNORMAL HIGH (ref 70–99)
Glucose-Capillary: 140 mg/dL — ABNORMAL HIGH (ref 70–99)

## 2018-09-17 LAB — CBC
HCT: 30.1 % — ABNORMAL LOW (ref 39.0–52.0)
Hemoglobin: 9.3 g/dL — ABNORMAL LOW (ref 13.0–17.0)
MCH: 29.7 pg (ref 26.0–34.0)
MCHC: 30.9 g/dL (ref 30.0–36.0)
MCV: 96.2 fL (ref 78.0–100.0)
PLATELETS: 140 10*3/uL — AB (ref 150–400)
RBC: 3.13 MIL/uL — ABNORMAL LOW (ref 4.22–5.81)
RDW: 15.1 % (ref 11.5–15.5)
WBC: 26.7 10*3/uL — AB (ref 4.0–10.5)

## 2018-09-17 LAB — HEPARIN LEVEL (UNFRACTIONATED): Heparin Unfractionated: 0.4 IU/mL (ref 0.30–0.70)

## 2018-09-17 NOTE — Progress Notes (Signed)
ANTICOAGULATION CONSULT NOTE - Follow Up Consult  Pharmacy Consult for Heparin - No Bolus Indication: pulmonary embolus and DVT  Allergies  Allergen Reactions  . Ace Inhibitors Cough    With Lisinopril.  . Angiotensin Receptor Blockers Other (See Comments)    Caused excessive weight gain  . Metformin Diarrhea    Patient Measurements: Height: 6' (182.9 cm) Weight: (!) 332 lb 10.8 oz (150.9 kg) IBW/kg (Calculated) : 77.6 Heparin Dosing Weight: 112 kg  Vital Signs: Temp: 97.5 F (36.4 C) (09/19 0346) Temp Source: Axillary (09/19 0346) BP: 122/58 (09/19 0600) Pulse Rate: 55 (09/19 0600)  Labs: Recent Labs    09/14/18 1549 09/14/18 1715 09/14/18 2034 09/15/18 0234 09/15/18 0254  09/16/18 0300 09/16/18 0830 09/16/18 1501 09/16/18 1931 09/17/18 0328  HGB  --   --   --   --   --    < > 9.5*  --   --  9.9* 9.3*  HCT  --   --   --   --   --    < > 30.4*  --   --  32.8* 30.1*  PLT  --   --   --   --   --    < > 202  --   --  159 140*  APTT  --  40*  --   --   --   --   --   --   --   --   --   LABPROT  --  14.8  --   --   --   --   --   --   --   --   --   INR  --  1.17  --   --   --   --   --   --   --   --   --   HEPARINUNFRC  --   --   --   --   --    < >  --  0.38 0.40  --  0.40  CREATININE  --   --   --   --  0.97  --   --   --   --   --   --   CKTOTAL 41*  --   --   --   --   --   --   --   --   --   --   TROPONINI 0.07*  --  0.06* 0.06*  --   --   --   --   --   --   --    < > = values in this interval not displayed.    Estimated Creatinine Clearance: 94.9 mL/min (by C-G formula based on SCr of 0.97 mg/dL).   Medications:  Infusions:  . dextrose 10 mL/hr at 09/16/18 1022  . heparin 1,400 Units/hr (09/17/18 0400)    Assessment: Pharmacy has been consulted to dose and monitor heparin drip in this 77 year old male for PE/DVT.  Patient has had a prolonged hospitalization, admitted on 8/21 for GI bleed, then complicated by a PE.  He was started on heparin drip  and tolerated it, but after being converted to apixaban on 9/7, patient developed GI bleeding requiring Andexxa reversal and embolization and has been off of anticoagulation since that time. Heparin drip being resumed at this time for recent PE and new RUE DVT.   Discussed plan for heparin drip with CCM MD: NO BOLUSES, will target lower  end of therapeutic range of 0.3 - 0.5, will check CBC q8h initially  Today, 09/17/2018:  Heparin level 0.4 is therapeutic on heparin at 1400 units/hr  CBC:  Hgb 9.3 is decreased from yesterday, but near new "pre-heparin" baseline of 9.2 (on 9/17), Plt decreased to 140  No bleeding or complications reported   Goal of Therapy:  Heparin level 0.3-0.5 units/ml (Low goal d/t hx GIB) Monitor platelets by anticoagulation protocol: Yes   Plan:  NO Heparin bolus  Continue heparin IV infusion at 1400 units/hr Daily heparin level and CBC Continue to monitor H&H and platelets   Gretta Arab PharmD, BCPS Pager [phone] 09/17/2018 7:04 AM

## 2018-09-17 NOTE — Progress Notes (Signed)
PROGRESS NOTE    [name]   CBJ:[id number]  DOB: 1941/08/24  DOA: 08/19/2018 PCP: [place] Bing, DO   Brief Narrative:  [name] is a 77 y/o with PMH of DM with neuropathy, chronic venous insufficiency, chronic osteomyelitis, chronic systolic and diastolic CHF (EF 16-07%), ischemic cardiomyopathy, HTN, Prostate CA who was being treated for c diff colitis as outpt and admitted for melena, hypotension, AKI with a Hb of 6.7 and a BUN/Cr of 111/2.1.  7/30-8/8>> He had a hospitalization for sepsis (urine/ pneumonia/ soft tissue) & PEA cardiac arrest suspected to be from a CHF exacerbation with anew drop in EF to 35%.    After admission on 8/21, he was stabilized with blood transfusions and his bleeding resolved. He was going to be discharged on 8/24 but then had a large BM with drop in Hb. EGD 8/29 > large duodenal bulb ulcer with the large visible vessel and evidence of recent bleeding- treated with epinephrine injection and gold probe cautery. Patient had oozing of blood after gold probe cautery and subsequently Hemo spray was used with achievement of hemostasis. 8/30 - RUE PICC placed 9/4 he was found to have a PE and started on Heparin infusion. LE venous duplex negative on 9/9 9/6 palliative care consulted 9/7 developed hematemesis, hypotension and transferred to ICU for pressors- anticoagulation reversed and transfused PRBC- embolized by IR 9/9 - pressors stopped- transferred out of ICU   9/10 repeat EGD show no further bleeding 9/13 transferred back to SDU for acute resp failure related to fluid overloaded-   9/16 found to have RUE DVT related to PICC- Heparin resumed without bolus. 9/17 cardiology consulted for new systolic CHF evaluation, noted to have abnormal EKG (anterolateral T wave changes), Troponin 0.06- determined to not be a candidate for further intervention due to inability to adequately anticoagulate if stenting needed. Cardiology signed off. 9/18 Levophed  tapered off  9/19 transferred to [facility]   Subjective:  no complaints.  ROS: no complaints of nausea, vomiting, constipation diarrhea, cough, dyspnea or dysuria. No other complaints.   Assessment & Plan:   Principal Problem: Hypovolemic shock due to GI bleed - due to duodenal ulcer - Eliqus was reversed with Andexxa - repeat EGD on 9/10 showed bleeding had resolved - Eliquis on hold and on Heparin at this time - also on PPI BID and Carafate   Active Problems: Acute on chronic hypercarbic resp failure/ Acute HRrEF/ Acute PE - has been adequately diuresed and is quite stable now - currently receiving Lasix 20 mg IV BID- takes Demadex 40 daily at home - on K replacement - cont CPAP at bedtime  - currently on 3 L O2 - incentive spirometry ordered - on Heparin infusion  AKI - Cr 2.1 on 8/21 - has been in 0.7 - 1.1 range - follow    Morbid obesity   Body mass index is 45.12 kg/m.    Essential hypertension - Losartan, Metoprolol on hold due to hypotensive episodes- follow bP    Chronic venous insufficiency, Bilateral Legs - has Unna boots    Malnutrition of moderate degree - cont Prostat and Boost    DVT prophylaxis: Heparin infusion Code Status: N CPR, No intubation, use BiPAP, administer ACLS meds, Cardiovert if needed Family Communication:   Disposition Plan: follow in SDU Consultants:   GI  IR  PCCM  Palliative care  Cardiology Procedures:  EGD 8/29 >> large duodenal ulcer with visible vessel, biopsy c/w chronic gastritis, focal inflammation  EGD 9/10 >> no bleeding LE Doppler 9/9 >> negative   Antimicrobials:  Anti-infectives (From admission, onward)   Start     Dose/Rate Route Frequency Ordered Stop   08/22/18 0000  vancomycin (VANCOCIN) 50 mg/mL oral solution     125 mg Oral 4 times daily 08/22/18 1323     08/19/18 1930  vancomycin (VANCOCIN) 50 mg/mL oral solution 125 mg     125 mg Oral 4 times daily 08/19/18 1812 08/29/18 1759        Objective: Vitals:   09/17/18 0346 09/17/18 0400 09/17/18 0600 09/17/18 0712  BP:  (!) 110/53 (!) 122/58   Pulse:  (!) 53 (!) 55   Resp:  13 16   Temp: (!) 97.5 F (36.4 C)   97.7 F (36.5 C)  TempSrc: Axillary   Axillary  SpO2:  100% 100%   Weight:      Height:        Intake/Output Summary (Last 24 hours) at 09/17/2018 0724 Last data filed at 09/17/2018 0600 Gross per 24 hour  Intake 881.22 ml  Output 980 ml  Net -98.78 ml   Filed Weights   09/14/18 0600 09/15/18 0500 09/16/18 0355  Weight: (!) 150.4 kg (!) 147.7 kg (!) 150.9 kg    Examination: General exam: Appears comfortable  HEENT: PERRLA, oral mucosa moist, no sclera icterus or thrush Respiratory system: Clear to auscultation. Respiratory effort normal. Cardiovascular system: S1 & S2 heard, RRR.   Gastrointestinal system: Abdomen soft, non-tender, nondistended. Normal bowel sound. No organomegaly Central nervous system: Alert and oriented. No focal neurological deficits. Extremities: No cyanosis, clubbing - 3 + edema of right forearm and hand- legs are wrapped Skin: No rashes or ulcers Psychiatry:  Mood & affect appropriate.     Data Reviewed: I have personally reviewed following labs and imaging studies  CBC: Recent Labs  Lab 09/11/18 0339  09/15/18 1208 09/15/18 1900 09/16/18 0300 09/16/18 1931 09/17/18 0328  WBC 8.5   < > 28.5* 32.1* 29.1* 31.7* 26.7*  NEUTROABS 6.4  --   --   --   --   --   --   HGB 8.9*   < > 9.7* 9.3* 9.5* 9.9* 9.3*  HCT 29.5*   < > 30.4* 29.7* 30.4* 32.8* 30.1*  MCV 97.7   < > 92.7 92.5 93.5 95.6 96.2  PLT 184   < > 185 200 202 159 140*   < > = values in this interval not displayed.   Basic Metabolic Panel: Recent Labs  Lab 09/11/18 0339 09/12/18 0627 09/13/18 0648 09/14/18 0131 09/15/18 0254  NA 150* 146* 146* 143 138  K 4.2 3.3* 3.8 4.1 4.4  CL 112* 106 107 105 99  CO2 32 34* 33* 32 33*  GLUCOSE 91 91 107* 75 249*  BUN 9 12 14 18 16   CREATININE 0.70 0.79  0.80 1.15 0.97  CALCIUM 8.5* 8.3* 8.0* 8.1* 7.9*  MG 1.6*  --   --   --   --   PHOS 2.5  --   --   --   --    GFR: Estimated Creatinine Clearance: 94.9 mL/min (by C-G formula based on SCr of 0.97 mg/dL). Liver Function Tests: Recent Labs  Lab 09/14/18 1549 09/15/18 0254 09/16/18 1500  AST 55* 50* 28  ALT 34 40 33  ALKPHOS 186* 198* 191*  BILITOT 0.8 0.8 0.8  PROT 5.3* 5.5* 5.7*  ALBUMIN 2.0* 2.0* 1.9*   No results for input(s): LIPASE, AMYLASE  in the last 168 hours. No results for input(s): AMMONIA in the last 168 hours. Coagulation Profile: Recent Labs  Lab 09/14/18 1715  INR 1.17   Cardiac Enzymes: Recent Labs  Lab 09/14/18 1549 09/14/18 2034 09/15/18 0234  CKTOTAL 41*  --   --   TROPONINI 0.07* 0.06* 0.06*   BNP (last 3 results) No results for input(s): PROBNP in the last 8760 hours. HbA1C: No results for input(s): HGBA1C in the last 72 hours. CBG: Recent Labs  Lab 09/15/18 2207 09/16/18 0813 09/16/18 1139 09/16/18 1650 09/16/18 2132  GLUCAP 164* 153* 211* 169* 162*   Lipid Profile: No results for input(s): CHOL, HDL, LDLCALC, TRIG, CHOLHDL, LDLDIRECT in the last 72 hours. Thyroid Function Tests: No results for input(s): TSH, T4TOTAL, FREET4, T3FREE, THYROIDAB in the last 72 hours. Anemia Panel: No results for input(s): VITAMINB12, FOLATE, FERRITIN, TIBC, IRON, RETICCTPCT in the last 72 hours. Urine analysis:    Component Value Date/Time   COLORURINE YELLOW 09/16/2018 1817   APPEARANCEUR CLEAR 09/16/2018 1817   LABSPEC 1.005 09/16/2018 1817   PHURINE 5.0 09/16/2018 1817   GLUCOSEU NEGATIVE 09/16/2018 1817   HGBUR SMALL (A) 09/16/2018 1817   HGBUR negative 06/08/2007 1320   BILIRUBINUR NEGATIVE 09/16/2018 1817   BILIRUBINUR NEG 02/25/2012 1351   KETONESUR NEGATIVE 09/16/2018 1817   PROTEINUR NEGATIVE 09/16/2018 1817   UROBILINOGEN 1.0 11/10/2012 2115   NITRITE NEGATIVE 09/16/2018 1817   LEUKOCYTESUR NEGATIVE 09/16/2018 1817   Sepsis  Labs: @LABRCNTIP (procalcitonin:4,lacticidven:4) ) Recent Results (from the past 240 hour(s))  Culture, blood (Routine X 2) w Reflex to ID Panel     Status: None (Preliminary result)   Collection Time: 09/14/18  3:50 PM  Result Value Ref Range Status   Specimen Description   Final    BLOOD RIGHT ANTECUBITAL Performed at [facility], [place] [address] [place], [place] [address]    Special Requests   Final    BOTTLES DRAWN AEROBIC AND ANAEROBIC Blood Culture adequate volume Performed at [place] [address] [place], [place] [address]    Culture   Final    NO GROWTH 3 DAYS Performed at [facility], [place] [address] [place], [place] [address]    Report Status PENDING  Incomplete  Culture, blood (Routine X 2) w Reflex to ID Panel     Status: None (Preliminary result)   Collection Time: 09/14/18  3:51 PM  Result Value Ref Range Status   Specimen Description   Final    BLOOD RIGHT ANTECUBITAL Performed at [facility] [address], [place], [place] [address]    Special Requests   Final    BOTTLES DRAWN AEROBIC ONLY Blood Culture adequate volume Performed at [facility] [address], [place], [place] [address]    Culture   Final    NO GROWTH 3 DAYS Performed at [facility], [place] [address], [place], [place] [address]    Report Status PENDING  Incomplete         Radiology Studies: US Abdomen Limited Ruq  Result Date: 09/16/2018 CLINICAL DATA:  Abdominal pain with elevated white blood cell count and elevated liver enzymes EXAM: ULTRASOUND ABDOMEN LIMITED RIGHT UPPER QUADRANT COMPARISON:  CT abdomen and pelvis July 29, 2018 FINDINGS: Gallbladder: Within the gallbladder, there are multiple echogenic foci which move and shadow consistent with cholelithiasis. Largest gallstone measures 2.1 cm in length. There is no gallbladder wall thickening or pericholecystic fluid. No sonographic  Murphy sign noted by sonographer. Common bile duct: Diameter: 3 mm. No intrahepatic or extrahepatic biliary duct dilatation. Liver: No focal lesion identified. Within normal limits in parenchymal echogenicity. Portal vein is patent on color Doppler imaging with normal direction of blood flow towards the liver. IMPRESSION: Cholelithiasis. No gallbladder wall thickening or pericholecystic fluid. Study otherwise unremarkable. Electronically Signed   By: Lowella Grip III M.D.   On: 09/16/2018 09:45      Scheduled Meds: . chlorhexidine  15 mL Mouth Rinse BID  . Chlorhexidine Gluconate Cloth  6 each Topical Q0600  . feeding supplement  1 Container Oral BID BM  . feeding supplement (PRO-STAT SUGAR FREE 64)  30 mL Oral BID  . furosemide  20 mg Intravenous BID  . hydrocortisone  20 mg Oral Daily  . insulin aspart  0-20 Units Subcutaneous TID AC & HS  . latanoprost  1 drop Both Eyes QHS  . mouth rinse  15 mL Mouth Rinse q12n4p  . multivitamin with minerals  1 tablet Oral Daily  . pantoprazole  40 mg Oral BID AC  . potassium chloride  40 mEq Oral BID  . sodium chloride flush  10-40 mL Intracatheter Q12H  . sucralfate  1 g Oral TID WC & HS  . thiamine  100 mg Oral Daily   Continuous Infusions: . dextrose 10 mL/hr at 09/16/18 1022  . heparin 1,400 Units/hr (09/17/18 0400)     LOS: 29 days    Time spent in minutes: 35    Debbe Odea, MD [facility] Pager: [email or web address] Password [facility] 09/17/2018, 7:24 AM

## 2018-09-18 LAB — CBC
HEMATOCRIT: 31.6 % — AB (ref 39.0–52.0)
HEMOGLOBIN: 9.2 g/dL — AB (ref 13.0–17.0)
MCH: 28.5 pg (ref 26.0–34.0)
MCHC: 29.1 g/dL — ABNORMAL LOW (ref 30.0–36.0)
MCV: 97.8 fL (ref 78.0–100.0)
Platelets: 135 10*3/uL — ABNORMAL LOW (ref 150–400)
RBC: 3.23 MIL/uL — ABNORMAL LOW (ref 4.22–5.81)
RDW: 15 % (ref 11.5–15.5)
WBC: 17.8 10*3/uL — AB (ref 4.0–10.5)

## 2018-09-18 LAB — GLUCOSE, CAPILLARY
Glucose-Capillary: 74 mg/dL (ref 70–99)
Glucose-Capillary: 130 mg/dL — ABNORMAL HIGH (ref 70–99)
Glucose-Capillary: 127 mg/dL — ABNORMAL HIGH (ref 70–99)
Glucose-Capillary: 82 mg/dL (ref 70–99)

## 2018-09-18 LAB — BASIC METABOLIC PANEL
ANION GAP: 7 (ref 5–15)
BUN: 27 mg/dL — ABNORMAL HIGH (ref 8–23)
CALCIUM: 8.2 mg/dL — AB (ref 8.9–10.3)
CHLORIDE: 104 mmol/L (ref 98–111)
CO2: 32 mmol/L (ref 22–32)
Creatinine, Ser: 1.07 mg/dL (ref 0.61–1.24)
GFR calc non Af Amer: >60 mL/min (ref >60)
GLUCOSE: 97 mg/dL (ref 70–99)
POTASSIUM: 4.3 mmol/L (ref 3.5–5.1)
Sodium: 143 mmol/L (ref 135–145)

## 2018-09-18 LAB — HEPARIN LEVEL (UNFRACTIONATED): HEPARIN UNFRACTIONATED: 0.25 [IU]/mL — AB (ref 0.30–0.70)

## 2018-09-18 MED ORDER — APIXABAN 5 MG PO TABS
5.0000 mg | ORAL_TABLET | Freq: Two times a day (BID) | ORAL | Status: DC
  Administered 2018-09-18 (×2): 5 mg via ORAL
  Filled 2018-09-18 (×2): qty 1

## 2018-09-18 MED ORDER — TORSEMIDE 20 MG PO TABS
40.0000 mg | ORAL_TABLET | Freq: Every day | ORAL | Status: DC
  Administered 2018-09-18 – 2018-09-19 (×2): 40 mg via ORAL
  Filled 2018-09-18 (×2): qty 2

## 2018-09-18 MED ORDER — HEPARIN (PORCINE) IN NACL 100-0.45 UNIT/ML-% IJ SOLN: 1500.0000 [IU]/h | INTRAMUSCULAR | Status: DC

## 2018-09-18 MED ORDER — SODIUM CHLORIDE 0.9 % IV SOLN: INTRAVENOUS | Status: DC | PRN

## 2018-09-18 NOTE — Progress Notes (Signed)
PROGRESS NOTE    [name]   OXB:[id number]  DOB: June 10, 1941  DOA: 08/19/2018 PCP: [place] Bing, DO   Brief Narrative:  [name] is a 77 y/o with PMH of DM with neuropathy, chronic venous insufficiency, chronic osteomyelitis, chronic systolic and diastolic CHF (EF 68-34%), ischemic cardiomyopathy, HTN, Prostate CA who was being treated for c diff colitis as outpt and admitted for melena, hypotension, AKI with a Hb of 6.7 and a BUN/Cr of 111/2.1.  7/30-8/8>> He had a hospitalization for sepsis (urine/ pneumonia/ soft tissue) & PEA cardiac arrest suspected to be from a CHF exacerbation with anew drop in EF to 35%.    After admission on 8/21, he was stabilized with blood transfusions and his bleeding resolved. He was going to be discharged on 8/24 but then had a large BM with drop in Hb. EGD 8/29 > large duodenal bulb ulcer with the large visible vessel and evidence of recent bleeding- treated with epinephrine injection and gold probe cautery. Patient had oozing of blood after gold probe cautery and subsequently Hemo spray was used with achievement of hemostasis. 8/30 - RUE PICC placed 9/4 he was found to have a PE and started on Heparin infusion. LE venous duplex negative on 9/9 9/6 palliative care consulted 9/7 developed hematemesis, hypotension and transferred to ICU for pressors- anticoagulation reversed and transfused PRBC- embolized by IR 9/9 - pressors stopped- transferred out of ICU   9/10 repeat EGD show no further bleeding 9/13 transferred back to SDU for acute resp failure related to fluid overloaded-   9/16 found to have RUE DVT related to PICC- Heparin resumed without bolus. 9/17 cardiology consulted for new systolic CHF evaluation, noted to have abnormal EKG (anterolateral T wave changes), Troponin 0.06- determined to not be a candidate for further intervention due to inability to adequately anticoagulate if stenting needed. Cardiology signed off. 9/18 Levophed  tapered off  9/19 transferred to [facility]   Subjective: He has no complaints today.  ROS: no complaints of nausea, vomiting, constipation diarrhea, cough, dyspnea or dysuria. No other complaints.   Assessment & Plan:   Principal Problem: Hypovolemic shock due to GI bleed - due to duodenal ulcer - Eliqus was reversed with Andexxa - repeat EGD on 9/10 showed bleeding had resolved - Eliquis on hold and on Heparin- as there has been no further bleeding, will transition back to Eliquis today and cautiously monitor in SDU for now - cont PPI BID and Carafate   Active Problems: Acute on chronic hypercarbic resp failure/ Acute HRrEF/ Acute PE - has been adequately diuresed and is quite stable now - currently receiving Lasix 20 mg IV BID- takes Demadex 40 daily at home which I will resume today - on K replacement - cont CPAP at bedtime  - currently on 3 L O2- wean as able - incentive spirometry ordered   AKI - Cr 2.1 on 8/21 - has been in 0.7 - 1.1 range - follow    Morbid obesity   Body mass index is 45.48 kg/m.    Essential hypertension - Losartan, Metoprolol on hold due to hypotensive episodes- follow bP  ? Adrenal insufficiency - plasma cortisol checked on 9/16 was 10.8 - currently on Cortef which was started by ICU team- cont to follow and wean as able    Chronic venous insufficiency, Bilateral Legs - has Unna boots    Malnutrition of moderate degree - cont Prostat and Boost    DVT prophylaxis: Heparin infusion Code  Status: No CPR, No intubation- can use BiPAP, administer ACLS meds, Cardiovert if needed Family Communication:   Disposition Plan: follow in SDU Consultants:   GI  IR  PCCM  Palliative care  Cardiology Procedures:  EGD 8/29 >> large duodenal ulcer with visible vessel, biopsy c/w chronic gastritis, focal inflammation  EGD 9/10 >> no bleeding LE Doppler 9/9 >> negative   Antimicrobials:  Anti-infectives (From admission, onward)    Start     Dose/Rate Route Frequency Ordered Stop   08/22/18 0000  vancomycin (VANCOCIN) 50 mg/mL oral solution     125 mg Oral 4 times daily 08/22/18 1323     08/19/18 1930  vancomycin (VANCOCIN) 50 mg/mL oral solution 125 mg     125 mg Oral 4 times daily 08/19/18 1812 08/29/18 1759       Objective: Vitals:   09/18/18 0442 09/18/18 0500 09/18/18 0600 09/18/18 0725  BP:      Pulse:  (!) 59 (!) 57   Resp:  14 15   Temp:    97.7 F (36.5 C)  TempSrc:      SpO2:  100% 100%   Weight: (!) 152.1 kg     Height:        Intake/Output Summary (Last 24 hours) at 09/18/2018 0746 Last data filed at 09/18/2018 0500 Gross per 24 hour  Intake 1009.47 ml  Output 650 ml  Net 359.47 ml   Filed Weights   09/16/18 0355 09/17/18 1500 09/18/18 0442  Weight: (!) 150.9 kg (!) 144.9 kg (!) 152.1 kg    Examination: General exam: Appears comfortable  HEENT: PERRLA, oral mucosa moist, no sclera icterus or thrush Respiratory system: Clear to auscultation. Respiratory effort normal. Cardiovascular system: S1 & S2 heard, RRR.   Gastrointestinal system: Abdomen soft, non-tender, nondistended. Normal bowel sound. No organomegaly Central nervous system: Alert and oriented. No focal neurological deficits. Extremities: No cyanosis, clubbing - 2 + edema of right forearm and hand- legs are wrapped in Unna boots Skin: No rashes or ulcers Psychiatry:  Mood & affect appropriate.     Data Reviewed: I have personally reviewed following labs and imaging studies  CBC: Recent Labs  Lab 09/15/18 1900 09/16/18 0300 09/16/18 1931 09/17/18 0328 09/18/18 0404  WBC 32.1* 29.1* 31.7* 26.7* 17.8*  HGB 9.3* 9.5* 9.9* 9.3* 9.2*  HCT 29.7* 30.4* 32.8* 30.1* 31.6*  MCV 92.5 93.5 95.6 96.2 97.8  PLT 200 202 159 140* 976*   Basic Metabolic Panel: Recent Labs  Lab 09/12/18 0627 09/13/18 0648 09/14/18 0131 09/15/18 0254  NA 146* 146* 143 138  K 3.3* 3.8 4.1 4.4  CL 106 107 105 99  CO2 34* 33* 32 33*    GLUCOSE 91 107* 75 249*  BUN 12 14 18 16   CREATININE 0.79 0.80 1.15 0.97  CALCIUM 8.3* 8.0* 8.1* 7.9*   GFR: Estimated Creatinine Clearance: 95.3 mL/min (by C-G formula based on SCr of 0.97 mg/dL). Liver Function Tests: Recent Labs  Lab 09/14/18 1549 09/15/18 0254 09/16/18 1500  AST 55* 50* 28  ALT 34 40 33  ALKPHOS 186* 198* 191*  BILITOT 0.8 0.8 0.8  PROT 5.3* 5.5* 5.7*  ALBUMIN 2.0* 2.0* 1.9*   No results for input(s): LIPASE, AMYLASE in the last 168 hours. No results for input(s): AMMONIA in the last 168 hours. Coagulation Profile: Recent Labs  Lab 09/14/18 1715  INR 1.17   Cardiac Enzymes: Recent Labs  Lab 09/14/18 1549 09/14/18 2034 09/15/18 0234  CKTOTAL 41*  --   --  TROPONINI 0.07* 0.06* 0.06*   BNP (last 3 results) No results for input(s): PROBNP in the last 8760 hours. HbA1C: No results for input(s): HGBA1C in the last 72 hours. CBG: Recent Labs  Lab 09/17/18 0814 09/17/18 1227 09/17/18 1702 09/17/18 2052 09/18/18 0723  GLUCAP 83 176* 113* 140* 74   Lipid Profile: No results for input(s): CHOL, HDL, LDLCALC, TRIG, CHOLHDL, LDLDIRECT in the last 72 hours. Thyroid Function Tests: No results for input(s): TSH, T4TOTAL, FREET4, T3FREE, THYROIDAB in the last 72 hours. Anemia Panel: No results for input(s): VITAMINB12, FOLATE, FERRITIN, TIBC, IRON, RETICCTPCT in the last 72 hours. Urine analysis:    Component Value Date/Time   COLORURINE YELLOW 09/16/2018 1817   APPEARANCEUR CLEAR 09/16/2018 1817   LABSPEC 1.005 09/16/2018 1817   PHURINE 5.0 09/16/2018 1817   GLUCOSEU NEGATIVE 09/16/2018 1817   HGBUR SMALL (A) 09/16/2018 1817   HGBUR negative 06/08/2007 1320   BILIRUBINUR NEGATIVE 09/16/2018 1817   BILIRUBINUR NEG 02/25/2012 1351   KETONESUR NEGATIVE 09/16/2018 1817   PROTEINUR NEGATIVE 09/16/2018 1817   UROBILINOGEN 1.0 11/10/2012 2115   NITRITE NEGATIVE 09/16/2018 1817   LEUKOCYTESUR NEGATIVE 09/16/2018 1817   Sepsis  Labs: @LABRCNTIP (procalcitonin:4,lacticidven:4) ) Recent Results (from the past 240 hour(s))  Culture, blood (Routine X 2) w Reflex to ID Panel     Status: None (Preliminary result)   Collection Time: 09/14/18  3:50 PM  Result Value Ref Range Status   Specimen Description   Final    BLOOD RIGHT ANTECUBITAL Performed at [facility], [place] [address] [place], [place] [address]    Special Requests   Final    BOTTLES DRAWN AEROBIC AND ANAEROBIC Blood Culture adequate volume Performed at [facility] [address], [place], [place] [address]    Culture   Final    NO GROWTH 4 DAYS Performed at [facility], [place] [address] [place], [place] [address]    Report Status PENDING  Incomplete  Culture, blood (Routine X 2) w Reflex to ID Panel     Status: None (Preliminary result)   Collection Time: 09/14/18  3:51 PM  Result Value Ref Range Status   Specimen Description   Final    BLOOD RIGHT ANTECUBITAL Performed at [facility] [address], [place], [place] [address]    Special Requests   Final    BOTTLES DRAWN AEROBIC ONLY Blood Culture adequate volume Performed at [facility] [address], [place], [place] [address]    Culture   Final    NO GROWTH 4 DAYS Performed at [facility], [place] [address] [place], [place] [address]    Report Status PENDING  Incomplete         Radiology Studies: US Abdomen Limited Ruq  Result Date: 09/16/2018 CLINICAL DATA:  Abdominal pain with elevated white blood cell count and elevated liver enzymes EXAM: ULTRASOUND ABDOMEN LIMITED RIGHT UPPER QUADRANT COMPARISON:  CT abdomen and pelvis July 29, 2018 FINDINGS: Gallbladder: Within the gallbladder, there are multiple echogenic foci which move and shadow consistent with cholelithiasis. Largest gallstone measures 2.1 cm in length. There is no gallbladder wall thickening or pericholecystic fluid. No sonographic  Murphy sign noted by sonographer. Common bile duct: Diameter: 3 mm. No intrahepatic or extrahepatic biliary duct dilatation. Liver: No focal lesion identified. Within normal limits in parenchymal echogenicity. Portal vein is patent on color Doppler imaging with normal direction of blood flow towards the liver. IMPRESSION: Cholelithiasis. No  gallbladder wall thickening or pericholecystic fluid. Study otherwise unremarkable. Electronically Signed   By: Lowella Grip III M.D.   On: 09/16/2018 09:45      Scheduled Meds: . chlorhexidine  15 mL Mouth Rinse BID  . Chlorhexidine Gluconate Cloth  6 each Topical Q0600  . feeding supplement  1 Container Oral BID BM  . feeding supplement (PRO-STAT SUGAR FREE 64)  30 mL Oral BID  . furosemide  20 mg Intravenous BID  . hydrocortisone  20 mg Oral Daily  . insulin aspart  0-20 Units Subcutaneous TID AC & HS  . latanoprost  1 drop Both Eyes QHS  . mouth rinse  15 mL Mouth Rinse q12n4p  . multivitamin with minerals  1 tablet Oral Daily  . pantoprazole  40 mg Oral BID AC  . potassium chloride  40 mEq Oral BID  . sodium chloride flush  10-40 mL Intracatheter Q12H  . sucralfate  1 g Oral TID WC & HS  . thiamine  100 mg Oral Daily   Continuous Infusions: . dextrose 10 mL/hr at 09/17/18 2000  . heparin 1,500 Units/hr (09/18/18 0458)     LOS: 30 days    Time spent in minutes: [place], MD [facility] Pager: [email or web address] Password [email or web address] 09/18/2018, 7:46 AM

## 2018-09-18 NOTE — Procedures (Signed)
Arterial Catheter Insertion Procedure Note [name] [id number] 17-Oct-1941  Procedure: Insertion of Arterial Catheter  Indications: Blood pressure monitoring  Procedure Details Consent: Risks of procedure as well as the alternatives and risks of each were explained to the (patient/caregiver).  Consent for procedure obtained. Time Out: Verified patient identification, verified procedure, site/side was marked, verified correct patient position, special equipment/implants available, medications/allergies/relevent history reviewed, required imaging and test results available.  Performed  Maximum sterile technique was used including antiseptics, cap, gloves, gown, hand hygiene, mask and sheet. Skin prep: Chlorhexidine; local anesthetic administered 20 gauge catheter was inserted into left radial artery using the Seldinger technique. ULTRASOUND GUIDANCE USED: NO Evaluation Blood flow good; BP tracing good. Complications: No apparent complications.   [name] 09/18/2018

## 2018-09-18 NOTE — Progress Notes (Signed)
Nutrition Follow-up  DOCUMENTATION CODES:   Non-severe (moderate) malnutrition in context of chronic illness, Obesity unspecified  INTERVENTION:  - Continue Boost Breeze BID, Magic Cup BID, and Prostat BID. - Continue to encourage PO intakes.    NUTRITION DIAGNOSIS:   Moderate Malnutrition related to chronic illness(chronic osteomyelitis) as evidenced by mild fat depletion, moderate muscle depletion. -ongoing  GOAL:   Patient will meet greater than or equal to 90% of their needs -unmet on average.   MONITOR:   PO intake, Supplement acceptance, Weight trends, Labs, Skin  ASSESSMENT:   77 yo male who was admitted due to melena and hypotension, hypovolemic shock d/t acute blood loss anemia complicated by AKI. He has PMH significant for He chronic osteomyelitis, CHF, type 2 DM, GERD, hiatal hernia, and HTN.  He was currently treated for C. difficile colitis, and noticed at home melanotic stools. Recent hospitalization for sepsis and cardiac arrest 07/31/18.  Patient last seen by this RD on 9/17 where much encouragement to eat was provided and discussed. Patient has been consuming >/= 75% of the supplements provided. Per flow sheet, he ate well on 9/18 (total of 1059 kcal, 29 grams of protein) but appetite dropped back off yesterday (total of 318 kcal, 16 grams of protein from meals). Patient again states that appetite fluctuates and some days are better than others.    Medications reviewed; 20 mg Cortef/day, sliding scale Novolog, daily multivitamin with minerals, 40 mg oral Protonix BID, 40 mEq oral KCl BID, 1 g Carafate TID, 100 mg oral thiamine/day. Labs reviewed; CBGs: 74 and 130 mg/dL today, BUN: 27 mg/dL, Ca: 8.2 mg/dL.  IVF; D5 @ 10 mL/hr (41 kcal).    Diet Order:   Diet Order            DIET SOFT Room service appropriate? Yes; Fluid consistency: Thin  Diet effective now        Diet - low sodium heart healthy              EDUCATION NEEDS:   No education needs have  been identified at this time  Skin:  Skin Assessment: Skin Integrity Issues: Skin Integrity Issues:: Unstageable Stage II: L heel, L thigh, coccyx, sacrum Unstageable: full thickness to R toe  Last BM:  9/19  Height:   Ht Readings from Last 1 Encounters:  09/10/18 6' (1.829 m)    Weight:   Wt Readings from Last 1 Encounters:  09/18/18 (!) 152.1 kg    Ideal Body Weight:  80.91 kg  BMI:  Body mass index is 45.48 kg/m.  Estimated Nutritional Needs:   Kcal:  6734-1937 (15-17 kcal/kg)  Protein:  120-130 grams  Fluid:  >/= 2 L/day      Joel Matin, MS, RD, LDN, [facility] Clinical Dietitian Pager # [phone] After hours/weekend pager # [phone]

## 2018-09-18 NOTE — Care Management Note (Signed)
Case Management Note  Patient Details  Name: [name] MRN: [id number] Date of Birth: Feb 25, 1941  Subjective/Objective:                  tapered levophed, [facility] 09192019/wbc =17.8./i v heparin/d5w at [facility]/  Action/Plan: Following for progression of care and condition. Following for cm needs.  Expected Discharge Date:  08/22/18               Expected Discharge Plan:  Skilled Nursing Facility  In-House Referral:  Clinical Social Work  Discharge planning Services  CM Consult  Post Acute Care Choice:  NA Choice offered to:  NA  DME Arranged:  N/A DME Agency:  NA  HH Arranged:  NA HH Agency:  NA  Status of Service:  Completed, signed off  If discussed at [employer] of Stay Meetings, dates discussed:    Additional Comments:  Leeroy Cha, RN 09/18/2018, 10:48 AM

## 2018-09-18 NOTE — Progress Notes (Signed)
Blood pressure reading 94/39 with map of 54 with blood pressure cuff on left wrist. Larger blood pressure cuff placed on left upper arm, rechecked blood pressure reading 83/29 with map of 46. Heart rate 81, pt can answer orientation questions correctly, but seems lethargic. hospitalist notified. Currently at patient's bedside. Will continue to monitor at this time.

## 2018-09-18 NOTE — Progress Notes (Signed)
ANTICOAGULATION CONSULT NOTE - Follow Up Consult  Pharmacy Consult for Heparin - No Bolus Indication: pulmonary embolus and DVT  Allergies  Allergen Reactions  . Ace Inhibitors Cough    With Lisinopril.  . Angiotensin Receptor Blockers Other (See Comments)    Caused excessive weight gain  . Metformin Diarrhea    Patient Measurements: Height: 6' (182.9 cm) Weight: (!) 335 lb 5.1 oz (152.1 kg) IBW/kg (Calculated) : 77.6 Heparin Dosing Weight: 112 kg  Vital Signs: Temp: 97.9 F (36.6 C) (09/20 0400) Temp Source: Oral (09/20 0400) BP: 105/47 (09/20 0400) Pulse Rate: 59 (09/20 0400)  Labs: Recent Labs    09/16/18 1501 09/16/18 1931 09/17/18 0328 09/18/18 0404  HGB  --  9.9* 9.3* 9.2*  HCT  --  32.8* 30.1* 31.6*  PLT  --  159 140* 135*  HEPARINUNFRC 0.40  --  0.40 0.25*    Estimated Creatinine Clearance: 95.3 mL/min (by C-G formula based on SCr of 0.97 mg/dL).   Medications:  Infusions:  . dextrose 10 mL/hr at 09/17/18 2000  . heparin      Assessment: Pharmacy has been consulted to dose and monitor heparin drip in this 77 year old male for PE/DVT.  Patient has had a prolonged hospitalization, admitted on 8/21 for GI bleed, then complicated by a PE.  He was started on heparin drip and tolerated it, but after being converted to apixaban on 9/7, patient developed GI bleeding requiring Andexxa reversal and embolization and has been off of anticoagulation since that time. Heparin drip being resumed at this time for recent PE and new RUE DVT.   Discussed plan for heparin drip with CCM MD: NO BOLUSES, will target lower end of therapeutic range of 0.3 - 0.5, will check CBC q8h initially  9/20  Heparin level = 0.25 below goal  CBC:  Hgb 9.2 is decreased slightly from yesterday Plt decreased to 135  No bleeding or complications reported   Goal of Therapy:  Heparin level 0.3-0.5 units/ml (Low goal d/t hx GIB) Monitor platelets by anticoagulation protocol: Yes    Plan:  NO Heparin bolus  Increase heparin drip to 1500 units/hr Daily heparin level and CBC Continue to monitor H&H and platelets   Dorrene German 09/18/2018 4:51 AM

## 2018-09-18 NOTE — Progress Notes (Signed)
[place] for Apixaban Indication: pulmonary embolus and DVT  Allergies  Allergen Reactions  . Ace Inhibitors Cough    With Lisinopril.  . Angiotensin Receptor Blockers Other (See Comments)    Caused excessive weight gain  . Metformin Diarrhea    Patient Measurements: Height: 6' (182.9 cm) Weight: (!) 335 lb 5.1 oz (152.1 kg) IBW/kg (Calculated) : 77.6 Heparin Dosing Weight: 112 kg  Vital Signs: Temp: 97.7 F (36.5 C) (09/20 0725) Temp Source: Oral (09/20 0400) BP: 105/47 (09/20 0400) Pulse Rate: 57 (09/20 0600)  Labs: Recent Labs    09/16/18 1501 09/16/18 1931 09/17/18 0328 09/18/18 0404  HGB  --  9.9* 9.3* 9.2*  HCT  --  32.8* 30.1* 31.6*  PLT  --  159 140* 135*  HEPARINUNFRC 0.40  --  0.40 0.25*    Estimated Creatinine Clearance: 95.3 mL/min (by C-G formula based on SCr of 0.97 mg/dL).   Medications:  Infusions:  . dextrose 10 mL/hr at 09/17/18 2000  . heparin 1,500 Units/hr (09/18/18 0458)    Assessment: Patient is a 77 year old male with a prolonged hospitalization, admitted on 8/21 for GI bleed, then complicated by a PE.  He was started on heparin drip and tolerated it, but after being converted to apixaban on 9/6 PM, patient developed GI bleeding requiring Andexxa reversal and embolization on 9/7.  Repeat EGD showed no new bleeding on 9/10.  Heparin drip was resumed on 9/16 for recent PE and new RUE DVT related to PICC line.  He has tolerated the Heparin gtt for ~3.5 days.  Pharmacy is now consulted to transition from Heparin back to Apixaban. Per discussion with MD, patient and MD are aware of risks.   Today, 09/18/2018:  Heparin level 0.25 this morning was subtherapeutic on heparin at 1400 units/hr, rate was increased to 1500  Units/hr  CBC:  Hgb 9.2 remains low/stable (near new "pre-heparin" baseline of 9.2 on 9/17), Plt decreased to 135  No bleeding or complications reported   Goal of Therapy:  Heparin  level 0.3-0.5 units/ml (Low goal d/t hx GIB) Monitor platelets by anticoagulation protocol: Yes   Plan:   Stop Heparin at 12:00  Start Apixaban 5mg  PO BID at 12:00  Avoiding apixaban 10mg  BID x7 days loading dose in light of recent life threatening GI bleed.  Monitor CBC daily  Monitor GI symptoms   Gretta Arab PharmD, BCPS Pager [phone] 09/18/2018 8:02 AM

## 2018-09-18 NOTE — Progress Notes (Signed)
In regards to Enteric precautions, RN consulted with Infection control. Although pt. completed his Vancomycin course 08/27/18, pt. Needs to be on enteric precaution for at least 30 days (if not 40-since there is possibility of  shedding of viral load after 40 days). Per protocol, we need to continue to keep pt. on enteric precautions, unless pt. gets transferred to a clean room. Orthoptist, will consider transfer to a clean room when an empty room is available on our unit.

## 2018-09-19 LAB — CBC
HCT: 27.4 % — ABNORMAL LOW (ref 39.0–52.0)
HEMATOCRIT: 24.6 % — AB (ref 39.0–52.0)
HEMATOCRIT: 26.2 % — AB (ref 39.0–52.0)
HEMOGLOBIN: 7.7 g/dL — AB (ref 13.0–17.0)
HEMOGLOBIN: 8.9 g/dL — AB (ref 13.0–17.0)
Hemoglobin: 8.1 g/dL — ABNORMAL LOW (ref 13.0–17.0)
MCH: 29.2 pg (ref 26.0–34.0)
MCH: 28.8 pg (ref 26.0–34.0)
MCH: 28.9 pg (ref 26.0–34.0)
MCHC: 31.3 g/dL (ref 30.0–36.0)
MCHC: 30.9 g/dL (ref 30.0–36.0)
MCHC: 32.5 g/dL (ref 30.0–36.0)
MCV: 93.2 fL (ref 78.0–100.0)
MCV: 93.2 fL (ref 78.0–100.0)
MCV: 89 fL (ref 78.0–100.0)
PLATELETS: 143 10*3/uL — AB (ref 150–400)
Platelets: 128 10*3/uL — ABNORMAL LOW (ref 150–400)
Platelets: 137 10*3/uL — ABNORMAL LOW (ref 150–400)
RBC: 2.64 MIL/uL — AB (ref 4.22–5.81)
RBC: 2.81 MIL/uL — AB (ref 4.22–5.81)
RBC: 3.08 MIL/uL — AB (ref 4.22–5.81)
RDW: 14.7 % (ref 11.5–15.5)
RDW: 14.7 % (ref 11.5–15.5)
RDW: 14.8 % (ref 11.5–15.5)
WBC: 23.9 10*3/uL — AB (ref 4.0–10.5)
WBC: 21.2 10*3/uL — AB (ref 4.0–10.5)
WBC: 29 10*3/uL — AB (ref 4.0–10.5)

## 2018-09-19 LAB — BASIC METABOLIC PANEL
ANION GAP: 6 (ref 5–15)
BUN: 35 mg/dL — ABNORMAL HIGH (ref 8–23)
CO2: 32 mmol/L (ref 22–32)
Calcium: 7.8 mg/dL — ABNORMAL LOW (ref 8.9–10.3)
Chloride: 102 mmol/L (ref 98–111)
Creatinine, Ser: 1.17 mg/dL (ref 0.61–1.24)
GFR calc Af Amer: >60 mL/min (ref >60)
GFR calc non Af Amer: 58 mL/min — ABNORMAL LOW (ref >60)
Glucose, Bld: 100 mg/dL — ABNORMAL HIGH (ref 70–99)
POTASSIUM: 4.4 mmol/L (ref 3.5–5.1)
Sodium: 140 mmol/L (ref 135–145)

## 2018-09-19 LAB — CULTURE, BLOOD (ROUTINE X 2)
CULTURE: NO GROWTH
Culture: NO GROWTH
SPECIAL REQUESTS: ADEQUATE
Special Requests: ADEQUATE

## 2018-09-19 LAB — GLUCOSE, CAPILLARY
Glucose-Capillary: 98 mg/dL (ref 70–99)
Glucose-Capillary: 94 mg/dL (ref 70–99)
Glucose-Capillary: 179 mg/dL — ABNORMAL HIGH (ref 70–99)
Glucose-Capillary: 203 mg/dL — ABNORMAL HIGH (ref 70–99)
Glucose-Capillary: 101 mg/dL — ABNORMAL HIGH (ref 70–99)

## 2018-09-19 MED ORDER — SODIUM CHLORIDE 0.9 % IV BOLUS
500.0000 mL | Freq: Once | INTRAVENOUS | Status: AC
  Administered 2018-09-19: 500 mL via INTRAVENOUS

## 2018-09-19 NOTE — Progress Notes (Signed)
PROGRESS NOTE    [name]   ZOX:[id number]  DOB: 07-Jun-1941  DOA: 08/19/2018 PCP: [place] Bing, DO   Brief Narrative:  [name] is a 77 y/o with PMH of DM with neuropathy, chronic venous insufficiency, chronic osteomyelitis, chronic systolic and diastolic CHF (EF 81-19%), ischemic cardiomyopathy, HTN, Prostate CA who was being treated for c diff colitis as outpt and admitted for melena, hypotension, AKI with a Hb of 6.7 and a BUN/Cr of 111/2.1.  7/30-8/8>> He had a hospitalization for sepsis (urine/ pneumonia/ soft tissue) & PEA cardiac arrest suspected to be from a CHF exacerbation with anew drop in EF to 35%.    After admission on 8/21, he was stabilized with blood transfusions and his bleeding resolved. He was going to be discharged on 8/24 but then had a large BM with drop in Hb. EGD 8/29 > large duodenal bulb ulcer with the large visible vessel and evidence of recent bleeding- treated with epinephrine injection and gold probe cautery. Patient had oozing of blood after gold probe cautery and subsequently Hemo spray was used with achievement of hemostasis. 8/30 - RUE PICC placed 9/4 he was found to have a PE and started on Heparin infusion. LE venous duplex negative on 9/9 9/6 palliative care consulted 9/7 developed hematemesis, hypotension and transferred to ICU for pressors- anticoagulation reversed and transfused PRBC- embolized by IR 9/9 - pressors stopped- transferred out of ICU   9/10 repeat EGD show no further bleeding 9/13 transferred back to SDU for acute resp failure related to fluid overloaded-   9/16 found to have RUE DVT related to PICC- Heparin resumed without bolus. 9/17 cardiology consulted for new systolic CHF evaluation, noted to have abnormal EKG (anterolateral T wave changes), Troponin 0.06- determined to not be a candidate for further intervention due to inability to adequately anticoagulate if stenting needed. Cardiology signed off. 9/18 Levophed  tapered off  9/19 transferred to [facility]   Subjective: He states he feels confused today and doesn't understand what we are doing although I explained it in detail yesterday. I have again explained what we are doing for him in the hospital (in the presence of the RN). He has no complaints. RN tells me that he was quite confused last night again. BP has been low and an Arterial line was placed overnight. No signs of bleeding as of yet.     Assessment & Plan:   Principal Problem: Hypovolemic shock due to GI bleed - due to duodenal ulcer - Eliqus was reversed with Andexxa - repeat EGD on 9/10 showed bleeding had resolved - cont PPI BID and Carafate - 9/19- Eliquis on hold and on Heparin - 9/20- as there has been no further bleeding, will transition back to Eliquis today and cautiously monitor in SDU for now - 9/21- noted BP drop last night and drop in Hb by 1 gram today- will bolus 500 cc NS and d/c diuretics - hold Eliquis today- cont to follow closely for bleeding - repeat CBC every 8 hrs for now    Active Problems: Acute on chronic hypercarbic resp failure/ Acute HRrEF/ Acute PE - has been adequately diuresed and is quite stable now - on K replacement - cont CPAP at bedtime  - incentive spirometry ordered - hold diuretics and Eliquis today- on room air at this time with no signs of fluid overload   AKI - Cr 2.1 on 8/21 - has since been in 0.7 - 1.1 range and is stable -  follow    Morbid obesity   Body mass index is 43.15 kg/m.    Essential hypertension - Losartan, Metoprolol on hold due to hypotensive episodes- follow bP  ? Adrenal insufficiency - plasma cortisol checked on 9/16 was 10.8 - currently on Cortef which was started by ICU team-    Leukocytosis - no signs of infection at this time- following    Chronic venous insufficiency, Bilateral Legs - has Unna boots    Malnutrition of moderate degree - cont Prostat and Boost    DVT prophylaxis: Heparin  infusion Code Status: No CPR, No intubation- can use BiPAP, administer ACLS meds, Cardiovert if needed Family Communication:   Disposition Plan: follow in SDU Consultants:   GI  IR  PCCM  Palliative care  Cardiology Procedures:  EGD 8/29 >> large duodenal ulcer with visible vessel, biopsy c/w chronic gastritis, focal inflammation  EGD 9/10 >> no bleeding LE Doppler 9/9 >> negative   Antimicrobials:  Anti-infectives (From admission, onward)   Start     Dose/Rate Route Frequency Ordered Stop   08/22/18 0000  vancomycin (VANCOCIN) 50 mg/mL oral solution     125 mg Oral 4 times daily 08/22/18 1323     08/19/18 1930  vancomycin (VANCOCIN) 50 mg/mL oral solution 125 mg     125 mg Oral 4 times daily 08/19/18 1812 08/29/18 1759       Objective: Vitals:   09/19/18 0000 09/19/18 0400 09/19/18 0500 09/19/18 0716  BP:      Pulse: 68 65    Resp: 15 14    Temp:    98.9 F (37.2 C)  TempSrc:    Oral  SpO2: 100% 100%    Weight:   (!) 144.3 kg   Height:        Intake/Output Summary (Last 24 hours) at 09/19/2018 1026 Last data filed at 09/19/2018 0000 Gross per 24 hour  Intake 178.5 ml  Output 450 ml  Net -271.5 ml   Filed Weights   09/17/18 1500 09/18/18 0442 09/19/18 0500  Weight: (!) 144.9 kg (!) 152.1 kg (!) 144.3 kg    Examination: General exam: Appears comfortable  HEENT: PERRLA, oral mucosa moist, no sclera icterus or thrush Respiratory system: Clear to auscultation. Respiratory effort normal. Cardiovascular system: S1 & S2 heard, RRR.   Gastrointestinal system: Abdomen soft, non-tender, nondistended. Normal bowel sound. No organomegaly Central nervous system: Alert and oriented. No focal neurological deficits. Extremities: No cyanosis, clubbing - 2 + edema of right forearm and hand- legs are wrapped in Unna boots Skin: No rashes or ulcers Psychiatry:  Mood & affect appropriate.     Data Reviewed: I have personally reviewed following labs and imaging  studies  CBC: Recent Labs  Lab 09/16/18 1931 09/17/18 0328 09/18/18 0404 09/19/18 0316 09/19/18 0809  WBC 31.7* 26.7* 17.8* 23.9* 21.2*  HGB 9.9* 9.3* 9.2* 7.7* 8.1*  HCT 32.8* 30.1* 31.6* 24.6* 26.2*  MCV 95.6 96.2 97.8 93.2 93.2  PLT 159 140* 135* 128* 622*   Basic Metabolic Panel: Recent Labs  Lab 09/13/18 0648 09/14/18 0131 09/15/18 0254 09/18/18 0404 09/19/18 0316  NA 146* 143 138 143 140  K 3.8 4.1 4.4 4.3 4.4  CL 107 105 99 104 102  CO2 33* 32 33* 32 32  GLUCOSE 107* 75 249* 97 100*  BUN 14 18 16  27* 35*  CREATININE 0.80 1.15 0.97 1.07 1.17  CALCIUM 8.0* 8.1* 7.9* 8.2* 7.8*   GFR: Estimated Creatinine Clearance: 76.8 mL/min (by C-G  formula based on SCr of 1.17 mg/dL). Liver Function Tests: Recent Labs  Lab 09/14/18 1549 09/15/18 0254 09/16/18 1500  AST 55* 50* 28  ALT 34 40 33  ALKPHOS 186* 198* 191*  BILITOT 0.8 0.8 0.8  PROT 5.3* 5.5* 5.7*  ALBUMIN 2.0* 2.0* 1.9*   No results for input(s): LIPASE, AMYLASE in the last 168 hours. No results for input(s): AMMONIA in the last 168 hours. Coagulation Profile: Recent Labs  Lab 09/14/18 1715  INR 1.17   Cardiac Enzymes: Recent Labs  Lab 09/14/18 1549 09/14/18 2034 09/15/18 0234  CKTOTAL 41*  --   --   TROPONINI 0.07* 0.06* 0.06*   BNP (last 3 results) No results for input(s): PROBNP in the last 8760 hours. HbA1C: No results for input(s): HGBA1C in the last 72 hours. CBG: Recent Labs  Lab 09/18/18 1210 09/18/18 1606 09/18/18 2104 09/19/18 0714 09/19/18 0754  GLUCAP 130* 127* 82 98 94   Lipid Profile: No results for input(s): CHOL, HDL, LDLCALC, TRIG, CHOLHDL, LDLDIRECT in the last 72 hours. Thyroid Function Tests: No results for input(s): TSH, T4TOTAL, FREET4, T3FREE, THYROIDAB in the last 72 hours. Anemia Panel: No results for input(s): VITAMINB12, FOLATE, FERRITIN, TIBC, IRON, RETICCTPCT in the last 72 hours. Urine analysis:    Component Value Date/Time   COLORURINE YELLOW  09/16/2018 1817   APPEARANCEUR CLEAR 09/16/2018 1817   LABSPEC 1.005 09/16/2018 1817   PHURINE 5.0 09/16/2018 1817   GLUCOSEU NEGATIVE 09/16/2018 1817   HGBUR SMALL (A) 09/16/2018 1817   HGBUR negative 06/08/2007 1320   BILIRUBINUR NEGATIVE 09/16/2018 1817   BILIRUBINUR NEG 02/25/2012 1351   KETONESUR NEGATIVE 09/16/2018 1817   PROTEINUR NEGATIVE 09/16/2018 1817   UROBILINOGEN 1.0 11/10/2012 2115   NITRITE NEGATIVE 09/16/2018 1817   LEUKOCYTESUR NEGATIVE 09/16/2018 1817   Sepsis Labs: @LABRCNTIP (procalcitonin:4,lacticidven:4) ) Recent Results (from the past 240 hour(s))  Culture, blood (Routine X 2) w Reflex to ID Panel     Status: None   Collection Time: 09/14/18  3:50 PM  Result Value Ref Range Status   Specimen Description   Final    BLOOD RIGHT ANTECUBITAL Performed at [facility], [place] [address] [place], [place] [address]    Special Requests   Final    BOTTLES DRAWN AEROBIC AND ANAEROBIC Blood Culture adequate volume Performed at [facility] [address] [place], [place] [address]    Culture   Final    NO GROWTH 5 DAYS Performed at [facility], [place] [address], [place], [place] [address]    Report Status 09/19/2018 FINAL  Final  Culture, blood (Routine X 2) w Reflex to ID Panel     Status: None   Collection Time: 09/14/18  3:51 PM  Result Value Ref Range Status   Specimen Description   Final    BLOOD RIGHT ANTECUBITAL Performed at [facility] [address] [place], [place] [address]    Special Requests   Final    BOTTLES DRAWN AEROBIC ONLY Blood Culture adequate volume Performed at [facility] [address], [place], [place] [address]    Culture   Final    NO GROWTH 5 DAYS Performed at [facility], [place] [address], [place],  [address]    Report Status 09/19/2018 FINAL  Final         Radiology Studies: No results found.    Scheduled Meds: .  chlorhexidine  15 mL Mouth Rinse BID  .  Chlorhexidine Gluconate Cloth  6 each Topical Q0600  . feeding supplement  1 Container Oral BID BM  . feeding supplement (PRO-STAT SUGAR FREE 64)  30 mL Oral BID  . hydrocortisone  20 mg Oral Daily  . insulin aspart  0-20 Units Subcutaneous TID AC & HS  . latanoprost  1 drop Both Eyes QHS  . mouth rinse  15 mL Mouth Rinse q12n4p  . multivitamin with minerals  1 tablet Oral Daily  . pantoprazole  40 mg Oral BID AC  . potassium chloride  40 mEq Oral BID  . sodium chloride flush  10-40 mL Intracatheter Q12H  . sucralfate  1 g Oral TID WC & HS  . thiamine  100 mg Oral Daily  . torsemide  40 mg Oral Daily   Continuous Infusions: . sodium chloride    . dextrose 10 mL/hr at 09/18/18 1135  . sodium chloride       LOS: 31 days    Time spent in minutes: 35    Debbe Odea, MD [facility] Pager: [email or web address] Password [facility] 09/19/2018, 10:26 AM

## 2018-09-19 NOTE — Progress Notes (Signed)
OT Cancellation Note  Patient Details Name: [name] MRN: [id number] DOB: 1941-02-20   Cancelled Treatment:    Reason Eval/Treat Not Completed: Medical issues which prohibited therapy - Patient's BP 76/28. Will hold OT evaluation at this time.   Kaybree Williams A Krisinda Giovanni 09/19/2018, 9:30 AM
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
## 2018-09-19 NOTE — Progress Notes (Deleted)
Hand off report given to RN on 4th floor. Pt. Transferred with belongings (cell phone, bag with clothes & shoes)

## 2018-09-20 LAB — BASIC METABOLIC PANEL
Anion gap: 7 (ref 5–15)
BUN: 32 mg/dL — AB (ref 8–23)
CALCIUM: 8 mg/dL — AB (ref 8.9–10.3)
CO2: 34 mmol/L — ABNORMAL HIGH (ref 22–32)
Chloride: 101 mmol/L (ref 98–111)
Creatinine, Ser: 1.09 mg/dL (ref 0.61–1.24)
GFR calc Af Amer: >60 mL/min (ref >60)
GLUCOSE: 116 mg/dL — AB (ref 70–99)
Potassium: 4 mmol/L (ref 3.5–5.1)
Sodium: 142 mmol/L (ref 135–145)

## 2018-09-20 LAB — CBC
HCT: 26.4 % — ABNORMAL LOW (ref 39.0–52.0)
Hemoglobin: 8.4 g/dL — ABNORMAL LOW (ref 13.0–17.0)
MCH: 28.9 pg (ref 26.0–34.0)
MCHC: 31.8 g/dL (ref 30.0–36.0)
MCV: 90.7 fL (ref 78.0–100.0)
PLATELETS: 154 10*3/uL (ref 150–400)
RBC: 2.91 MIL/uL — ABNORMAL LOW (ref 4.22–5.81)
RDW: 15 % (ref 11.5–15.5)
WBC: 23.1 10*3/uL — AB (ref 4.0–10.5)

## 2018-09-20 LAB — GLUCOSE, CAPILLARY
GLUCOSE-CAPILLARY: 92 mg/dL (ref 70–99)
GLUCOSE-CAPILLARY: 167 mg/dL — AB (ref 70–99)
GLUCOSE-CAPILLARY: 125 mg/dL — AB (ref 70–99)
Glucose-Capillary: 142 mg/dL — ABNORMAL HIGH (ref 70–99)

## 2018-09-20 MED ORDER — APIXABAN 5 MG PO TABS
5.0000 mg | ORAL_TABLET | Freq: Two times a day (BID) | ORAL | Status: DC
  Administered 2018-09-20 – 2018-10-01 (×22): 5 mg via ORAL
  Filled 2018-09-20 (×22): qty 1

## 2018-09-20 NOTE — Progress Notes (Signed)
Left radial aline was non-functioning.  Catheter was removed and intact.  RN aware.  Pt tolerated well.  No redness or edema noted at site.

## 2018-09-20 NOTE — Progress Notes (Signed)
Pt educated on importance of using cpap, but he still declined to wear it tonight.  RN aware.

## 2018-09-20 NOTE — Progress Notes (Signed)
Occupational Therapy Evaluation Patient Details Name: [name] MRN: [id number] DOB: 1941/10/22 Today's Date: 09/20/2018    History of Present Illness Pt admitted with melena, GIB and hypotension.  Pt with hx of DM, Bil THR, chronic venous insufficiency, chronic osteomyelitis of lower leg, Pagets Disease, and R wrist fusion   Clinical Impression   Patient presents to OT with decreased ADL independence and safety due to the deficits listed below. He will benefit from skilled OT to maximize function and to facilitate a safe discharge. OT will follow.    Follow Up Recommendations  SNF    Equipment Recommendations  Other (comment)(TBD at next venue of care)    Recommendations for Other Services       Precautions / Restrictions Precautions Precautions: Fall Precaution Comments: monitor sats, O2 Restrictions Weight Bearing Restrictions: No      Mobility Bed Mobility               General bed mobility comments: NT - patient declined mobility on eval  Transfers                      Balance                                           ADL either performed or assessed with clinical judgement   ADL Overall ADL's : Needs assistance/impaired Eating/Feeding: Set up;Bed level   Grooming: Set up;Bed level   Upper Body Bathing: Bed level;Total assistance   Lower Body Bathing: Bed level;Total assistance             Toilet Transfer Details (indicate cue type and reason): patient states he does toileting in the bed and is then cleaned up by nursing staff                 Vision Baseline Vision/History: Wears glasses Wears Glasses: Reading only Patient Visual Report: No change from baseline       Perception     Praxis      Pertinent Vitals/Pain Pain Assessment: No/denies pain Pain Intervention(s): Monitored during session;Limited activity within patient's tolerance     Hand Dominance Right   Extremity/Trunk Assessment  Upper Extremity Assessment Upper Extremity Assessment: Generalized weakness Patient also has ROM restrictions B shoulders due to weakness, body habitus, and positioning in bed.   Lower Extremity Assessment Lower Extremity Assessment: Defer to PT evaluation       Communication Communication Communication: No difficulties   Cognition Arousal/Alertness: Awake/alert Behavior During Therapy: WFL for tasks assessed/performed Overall Cognitive Status: Within Functional Limits for tasks assessed                                 General Comments: very pleasant    General Comments       Exercises     Shoulder Instructions      Home Living Family/patient expects to be discharged to:: Skilled nursing facility Living Arrangements: Alone Available Help at Discharge: Family;Friend(s);Available PRN/intermittently Type of Home: Apartment Home Access: Elevator     Home Layout: One level     Bathroom Shower/Tub: Tub/shower unit(usually takes sponge baths)   Bathroom Toilet: Handicapped height     Home Equipment: Wheelchair - power;Crutches;Adaptive equipment;Hospital bed;Tub bench Adaptive Equipment: Reacher Additional Comments: usually gets around in power chair  Prior Functioning/Environment          Comments: Prior to admission to SNF 3 weeks he was transferring independently to motorized w/c, sponge bathed and dressed independently, used SCAT        OT Problem List: Decreased strength;Decreased activity tolerance;Decreased range of motion;Cardiopulmonary status limiting activity;Obesity      OT Treatment/Interventions: Self-care/ADL training;Therapeutic exercise;DME and/or AE instruction;Therapeutic activities;Patient/family education    OT Goals(Current goals can be found in the care plan section) Acute Rehab OT Goals Patient Stated Goal: Regain IND OT Goal Formulation: With patient Time For Goal Achievement: 10/04/18 Potential to Achieve Goals:  Fair  OT Frequency: Min 2X/week   Barriers to D/C: Decreased caregiver support          Co-evaluation              AM-PAC PT "6 Clicks" Daily Activity     Outcome Measure Help from another person eating meals?: None Help from another person taking care of personal grooming?: A Little Help from another person toileting, which includes using toliet, bedpan, or urinal?: Total Help from another person bathing (including washing, rinsing, drying)?: Total Help from another person to put on and taking off regular upper body clothing?: Total Help from another person to put on and taking off regular lower body clothing?: Total 6 Click Score: 11   End of Session Equipment Utilized During Treatment: Oxygen  Activity Tolerance: Patient tolerated treatment well Patient left: in bed;with call bell/phone within reach;with family/visitor present  OT Visit Diagnosis: Muscle weakness (generalized) (M62.81)                Time: 1035-1050 OT Time Calculation (min): 15 min Charges:  OT General Charges $OT Visit: 1 Visit OT Evaluation $OT Eval Low Complexity: 1 Low    Lurleen Soltero A Missy Baksh 09/20/2018, 12:10 PM

## 2018-09-20 NOTE — Progress Notes (Signed)
PROGRESS NOTE    [name]   SWF:[id number]  DOB: [birth year]  DOA: 08/19/2018 PCP: [place] Bing, DO   Brief Narrative:  [name] is a 77 y/o with PMH of DM with neuropathy, chronic venous insufficiency, chronic osteomyelitis, chronic systolic and diastolic CHF (EF 22-02%), ischemic cardiomyopathy, HTN, Prostate CA who was being treated for c diff colitis as outpt and admitted for melena, hypotension, AKI with a Hb of 6.7 and a BUN/Cr of 111/2.1.  7/30-8/8>> He had a hospitalization for sepsis (urine/ pneumonia/ soft tissue) & PEA cardiac arrest suspected to be from a CHF exacerbation with anew drop in EF to 35%.    After admission on 8/21, he was stabilized with blood transfusions and his bleeding resolved. He was going to be discharged on 8/24 but then had a large BM with drop in Hb. EGD 8/29 > large duodenal bulb ulcer with the large visible vessel and evidence of recent bleeding- treated with epinephrine injection and gold probe cautery. Patient had oozing of blood after gold probe cautery and subsequently Hemo spray was used with achievement of hemostasis. 8/30 - RUE PICC placed 9/4 he was found to have a PE and started on Heparin infusion. LE venous duplex negative on 9/9 9/6 palliative care consulted 9/7 developed hematemesis, hypotension and transferred to ICU for pressors- anticoagulation reversed and transfused PRBC- embolized by IR 9/9 - pressors stopped- transferred out of ICU   9/10 repeat EGD show no further bleeding 9/13 transferred back to SDU for acute resp failure related to fluid overloaded-   9/16 found to have RUE DVT related to PICC- Heparin resumed without bolus. 9/17 cardiology consulted for new systolic CHF evaluation, noted to have abnormal EKG (anterolateral T wave changes), Troponin 0.06- determined to not be a candidate for further intervention due to inability to adequately anticoagulate if stenting needed. Cardiology signed off. 9/18 Levophed  tapered off  9/19 transferred to [facility]   Subjective: He has no complaints today.    Assessment & Plan:   Principal Problem: Hypovolemic shock due to GI bleed - due to duodenal ulcer - Eliqus was reversed with Andexxa - repeat EGD on 9/10 showed bleeding had resolved - cont PPI BID and Carafate - 9/19- Eliquis on hold and on Heparin - 9/20- as there has been no further bleeding, will transition back to Eliquis today and cautiously monitor in SDU for now - 9/21- noted BP drop last night and drop in Hb by 1 gram - given bolused 500 cc NS and d/c'ddiuretics -  Eliquis held  - 9/22- Hb stable at 8-9 range- no signs of bleeding- will continue Eliquis   Active Problems: Acute on chronic hypercarbic resp failure/ Acute HRrEF/ Acute PE - has been adequately diuresed and is quite stable now - on K replacement - cont CPAP at bedtime  - incentive spirometry ordered - hold diuretics - BP is improving  - on room air at this time with no signs of fluid overload   AKI - Cr 2.1 on 8/21 - has since been in 0.7 - 1.1 range and is stable - follow    Morbid obesity   Body mass index is 42.64 kg/m.    Essential hypertension - Losartan, Metoprolol on hold due to hypotensive episodes- follow BP  ? Adrenal insufficiency - plasma cortisol checked on 9/16 was 10.8 - currently on Cortef which was started by ICU team-    Leukocytosis - no signs of infection at this time- following  Chronic venous insufficiency, Bilateral Legs - has Unna boots    Malnutrition of moderate degree - cont Prostat and Boost    DVT prophylaxis: Heparin infusion Code Status: No CPR, No intubation- can use BiPAP, administer ACLS meds, Cardiovert if needed Family Communication:   Disposition Plan: follow in SDU for bleeding Consultants:   GI  IR  PCCM  Palliative care  Cardiology Procedures:  EGD 8/29 >> large duodenal ulcer with visible vessel, biopsy c/w chronic gastritis, focal  inflammation  EGD 9/10 >> no bleeding LE Doppler 9/9 >> negative   Antimicrobials:  Anti-infectives (From admission, onward)   Start     Dose/Rate Route Frequency Ordered Stop   08/22/18 0000  vancomycin (VANCOCIN) 50 mg/mL oral solution     125 mg Oral 4 times daily 08/22/18 1323     08/19/18 1930  vancomycin (VANCOCIN) 50 mg/mL oral solution 125 mg     125 mg Oral 4 times daily 08/19/18 1812 08/29/18 1759       Objective: Vitals:   09/20/18 0500 09/20/18 0600 09/20/18 0703 09/20/18 0800  BP:    (!) 152/50  Pulse: (!) 57 (!) 52  61  Resp: (!) 27 16  (!) 21  Temp:   97.9 F (36.6 C)   TempSrc:   Oral   SpO2: 100% 100%  97%  Weight:      Height:        Intake/Output Summary (Last 24 hours) at 09/20/2018 1051 Last data filed at 09/20/2018 0535 Gross per 24 hour  Intake 1240.07 ml  Output 950 ml  Net 290.07 ml   Filed Weights   09/18/18 0442 09/19/18 0500 09/20/18 0437  Weight: (!) 152.1 kg (!) 144.3 kg (!) 142.6 kg    Examination: General exam: Appears comfortable  HEENT: PERRLA, oral mucosa moist, no sclera icterus or thrush Respiratory system: Clear to auscultation. Respiratory effort normal. Cardiovascular system: S1 & S2 heard, RRR.   Gastrointestinal system: Abdomen soft, non-tender, nondistended. Normal bowel sound. No organomegaly Central nervous system: Alert and oriented. No focal neurological deficits. Extremities: No cyanosis, clubbing - 2 + edema of right forearm and hand- legs are wrapped in Unna boots Skin: No rashes or ulcers Psychiatry:  Mood & affect appropriate.     Data Reviewed: I have personally reviewed following labs and imaging studies  CBC: Recent Labs  Lab 09/18/18 0404 09/19/18 0316 09/19/18 0809 09/19/18 1700 09/20/18 0035  WBC 17.8* 23.9* 21.2* 29.0* 23.1*  HGB 9.2* 7.7* 8.1* 8.9* 8.4*  HCT 31.6* 24.6* 26.2* 27.4* 26.4*  MCV 97.8 93.2 93.2 89.0 90.7  PLT 135* 128* 143* 137* 725   Basic Metabolic Panel: Recent Labs  Lab  09/14/18 0131 09/15/18 0254 09/18/18 0404 09/19/18 0316 09/20/18 0035  NA 143 138 143 140 142  K 4.1 4.4 4.3 4.4 4.0  CL 105 99 104 102 101  CO2 32 33* 32 32 34*  GLUCOSE 75 249* 97 100* 116*  BUN 18 16 27* 35* 32*  CREATININE 1.15 0.97 1.07 1.17 1.09  CALCIUM 8.1* 7.9* 8.2* 7.8* 8.0*   GFR: Estimated Creatinine Clearance: 81.8 mL/min (by C-G formula based on SCr of 1.09 mg/dL). Liver Function Tests: Recent Labs  Lab 09/14/18 1549 09/15/18 0254 09/16/18 1500  AST 55* 50* 28  ALT 34 40 33  ALKPHOS 186* 198* 191*  BILITOT 0.8 0.8 0.8  PROT 5.3* 5.5* 5.7*  ALBUMIN 2.0* 2.0* 1.9*   No results for input(s): LIPASE, AMYLASE in the last 168 hours.  No results for input(s): AMMONIA in the last 168 hours. Coagulation Profile: Recent Labs  Lab 09/14/18 1715  INR 1.17   Cardiac Enzymes: Recent Labs  Lab 09/14/18 1549 09/14/18 2034 09/15/18 0234  CKTOTAL 41*  --   --   TROPONINI 0.07* 0.06* 0.06*   BNP (last 3 results) No results for input(s): PROBNP in the last 8760 hours. HbA1C: No results for input(s): HGBA1C in the last 72 hours. CBG: Recent Labs  Lab 09/19/18 0754 09/19/18 1125 09/19/18 1657 09/19/18 2123 09/20/18 0740  GLUCAP 94 179* 203* 101* 92   Lipid Profile: No results for input(s): CHOL, HDL, LDLCALC, TRIG, CHOLHDL, LDLDIRECT in the last 72 hours. Thyroid Function Tests: No results for input(s): TSH, T4TOTAL, FREET4, T3FREE, THYROIDAB in the last 72 hours. Anemia Panel: No results for input(s): VITAMINB12, FOLATE, FERRITIN, TIBC, IRON, RETICCTPCT in the last 72 hours. Urine analysis:    Component Value Date/Time   COLORURINE YELLOW 09/16/2018 1817   APPEARANCEUR CLEAR 09/16/2018 1817   LABSPEC 1.005 09/16/2018 1817   PHURINE 5.0 09/16/2018 1817   GLUCOSEU NEGATIVE 09/16/2018 1817   HGBUR SMALL (A) 09/16/2018 1817   HGBUR negative 06/08/2007 1320   BILIRUBINUR NEGATIVE 09/16/2018 1817   BILIRUBINUR NEG 02/25/2012 1351   KETONESUR NEGATIVE  09/16/2018 1817   PROTEINUR NEGATIVE 09/16/2018 1817   UROBILINOGEN 1.0 11/10/2012 2115   NITRITE NEGATIVE 09/16/2018 1817   LEUKOCYTESUR NEGATIVE 09/16/2018 1817   Sepsis Labs: @LABRCNTIP (procalcitonin:4,lacticidven:4) ) Recent Results (from the past 240 hour(s))  Culture, blood (Routine X 2) w Reflex to ID Panel     Status: None   Collection Time: 09/14/18  3:50 PM  Result Value Ref Range Status   Specimen Description   Final    BLOOD RIGHT ANTECUBITAL Performed at [facility], [place] [address] [place], [place] [address]    Special Requests   Final    BOTTLES DRAWN AEROBIC AND ANAEROBIC Blood Culture adequate volume Performed at [place] [address] [place], [place] [address]    Culture   Final    NO GROWTH 5 DAYS Performed at [facility], [place] [address] [place], [place] [address]    Report Status 09/19/2018 FINAL  Final  Culture, blood (Routine X 2) w Reflex to ID Panel     Status: None   Collection Time: 09/14/18  3:51 PM  Result Value Ref Range Status   Specimen Description   Final    BLOOD RIGHT ANTECUBITAL Performed at [facility] [address], [place], [place] [address]    Special Requests   Final    BOTTLES DRAWN AEROBIC ONLY Blood Culture adequate volume Performed at [facility] [address], [place], [place] [address]    Culture   Final    NO GROWTH 5 DAYS Performed at [facility], [place] [address], [place], [place] [address]    Report Status 09/19/2018 FINAL  Final         Radiology Studies: No results found.    Scheduled Meds: . apixaban  5 mg Oral BID  . chlorhexidine  15 mL Mouth Rinse BID  . feeding supplement  1 Container Oral BID BM  . feeding supplement (PRO-STAT SUGAR FREE 64)  30 mL Oral BID  . hydrocortisone  20 mg Oral Daily  . insulin aspart  0-20 Units Subcutaneous TID AC & HS  . latanoprost  1 drop Both Eyes QHS  . mouth rinse  15 mL Mouth Rinse q12n4p  . multivitamin with minerals  1 tablet Oral Daily  . pantoprazole  40 mg Oral BID AC  . sodium chloride flush  10-40 mL Intracatheter Q12H  . sucralfate  1 g Oral TID WC & HS  . thiamine  100 mg Oral Daily   Continuous Infusions: . sodium chloride    . dextrose 10 mL/hr at 09/18/18 1135     LOS: 32 days    Time spent in minutes: [place], MD [facility] Pager: [email or web address] Password [facility] 09/20/2018, 10:51 AM

## 2018-09-20 NOTE — Progress Notes (Signed)
RT reported pt refused to wear CPAP.

## 2018-09-21 LAB — CBC
HEMATOCRIT: 33.5 % — AB (ref 39.0–52.0)
HEMOGLOBIN: 10.5 g/dL — AB (ref 13.0–17.0)
MCH: 28.9 pg (ref 26.0–34.0)
MCHC: 31.3 g/dL (ref 30.0–36.0)
MCV: 92.3 fL (ref 78.0–100.0)
Platelets: 205 10*3/uL (ref 150–400)
RBC: 3.63 MIL/uL — ABNORMAL LOW (ref 4.22–5.81)
RDW: 14.5 % (ref 11.5–15.5)
WBC: 15.8 10*3/uL — AB (ref 4.0–10.5)

## 2018-09-21 LAB — BASIC METABOLIC PANEL
ANION GAP: 8 (ref 5–15)
BUN: 28 mg/dL — ABNORMAL HIGH (ref 8–23)
CALCIUM: 8.4 mg/dL — AB (ref 8.9–10.3)
CHLORIDE: 101 mmol/L (ref 98–111)
CO2: 36 mmol/L — AB (ref 22–32)
Creatinine, Ser: 0.79 mg/dL (ref 0.61–1.24)
GFR calc non Af Amer: >60 mL/min (ref >60)
Glucose, Bld: 189 mg/dL — ABNORMAL HIGH (ref 70–99)
Potassium: 3.2 mmol/L — ABNORMAL LOW (ref 3.5–5.1)
SODIUM: 145 mmol/L (ref 135–145)

## 2018-09-21 LAB — GLUCOSE, CAPILLARY
GLUCOSE-CAPILLARY: 89 mg/dL (ref 70–99)
GLUCOSE-CAPILLARY: 102 mg/dL — AB (ref 70–99)
Glucose-Capillary: 190 mg/dL — ABNORMAL HIGH (ref 70–99)
Glucose-Capillary: 266 mg/dL — ABNORMAL HIGH (ref 70–99)
Glucose-Capillary: 45 mg/dL — ABNORMAL LOW (ref 70–99)

## 2018-09-21 MED ORDER — LOSARTAN POTASSIUM 50 MG PO TABS
25.0000 mg | ORAL_TABLET | Freq: Every day | ORAL | Status: DC
  Administered 2018-09-21: 25 mg via ORAL
  Filled 2018-09-21: qty 1

## 2018-09-21 MED ORDER — METOPROLOL TARTRATE 12.5 MG HALF TABLET
12.5000 mg | ORAL_TABLET | Freq: Two times a day (BID) | ORAL | Status: DC
  Administered 2018-09-21 – 2018-10-01 (×16): 12.5 mg via ORAL
  Filled 2018-09-21 (×18): qty 1

## 2018-09-21 NOTE — Progress Notes (Signed)
Initial palliative care consult on 9/6 after his 1st ICU admission, prolonged complicated hospitalization. I have been following [name] through his hospitalization intermittently and feel that his condition continue to deteriorate. He was largely resistant to me including any of his family in our conversation saying he made his own decisions about his care. He was nearly bedbound prior to admission and this is now much worse as he cannot without 100% assistance get oob and requires full assistance with ADL and is incontinent now in the bed.We discussed QOL in nursing home and long term care-he was insistent on returning to his home but this will definitely not be possible at this point.   He has both a PE and life threatening bleeding when on anticoagulation-this is a difficult situation that will require providers to be on the same page about how we intervene either to stop bleeding or treat the PE- my concern is even if achieves short term stabilization he will return to the hospital possibly several time and eventually he will die of one or the other condition-not to mention his multiple other chronic progressive conditions and painful wounds. He appears to now be having more difficulty with his memory and confusion- will require getting his family involved at this point to determine what the best care plan is for him.  He would be appropriate even with stabilization for hospice care upon discharge.   Joel Hacker, DO Palliative Medicine   Time: 25 min Greater than 50%  of this time was spent counseling and coordinating care related to the above assessment and plan.

## 2018-09-21 NOTE — Progress Notes (Signed)
[facility] for Apixaban Indication: pulmonary embolus and DVT  Allergies  Allergen Reactions  . Ace Inhibitors Cough    With Lisinopril.  . Angiotensin Receptor Blockers Other (See Comments)    Caused excessive weight gain  . Metformin Diarrhea    Patient Measurements: Height: 6' (182.9 cm) Weight: (!) 314 lb 6 oz (142.6 kg) IBW/kg (Calculated) : 77.6 Heparin Dosing Weight: 112 kg  Vital Signs: Temp: 98 F (36.7 C) (09/23 1213) Temp Source: Oral (09/23 1213) BP: 151/46 (09/23 0800) Pulse Rate: 57 (09/23 0800)  Labs: Recent Labs    09/19/18 0316 09/19/18 0809 09/19/18 1700 09/20/18 0035  HGB 7.7* 8.1* 8.9* 8.4*  HCT 24.6* 26.2* 27.4* 26.4*  PLT 128* 143* 137* 154  CREATININE 1.17  --   --  1.09    Estimated Creatinine Clearance: 81.8 mL/min (by C-G formula based on SCr of 1.09 mg/dL).   Medications:  Infusions:  . dextrose 10 mL/hr at 09/18/18 1135    Assessment: Patient is a 77 year old male with a prolonged hospitalization, admitted on 8/21 for GI bleed, then complicated by a PE.  He was started on heparin drip and tolerated it, but after being converted to apixaban on 9/6 PM, patient developed GI bleeding requiring Andexxa reversal and embolization on 9/7.  Repeat EGD showed no new bleeding on 9/10.  Heparin drip was resumed on 9/16 for recent PE and new RUE DVT related to PICC line.  He has tolerated the Heparin gtt for ~3.5 days.  Pharmacy is now consulted to transition from Heparin back to Apixaban. Per discussion with MD, patient and MD are aware of risks.   Today, 09/21/2018:  CBC 9/22:  Hgb 8.4 remains low/stable, Plt improved 154  SCr 9/22: 1.09, CrCl ~80 ml/min  No bleeding or complications reported   Goal of Therapy:  Therapeutic anticoagulation, prevention of recurrent VTE Monitor platelets by anticoagulation protocol: Yes   Plan:   Continue Apixaban 5mg  PO BID   Monitor CBC daily  Monitor GI  symptoms  Peggyann Juba, PharmD, BCPS Pager: [phone] 09/21/2018 12:19 PM

## 2018-09-21 NOTE — Care Management Note (Signed)
Case Management Note  Patient Details  Name: [name] MRN: [id number] Date of Birth: 03-01-1941  Subjective/Objective:                  From the chart: Principal Problem: Hypovolemic shock due to GI bleed - due to duodenal ulcer - Eliqus was reversed with Andexxa - repeat EGD on 9/10 showed bleeding had resolved - cont PPI BID and Carafate - 9/19- Eliquis on hold and on Heparin - 9/20- as there has been no further bleeding, will transition back to Eliquis today and cautiously monitor in SDU for now - 9/21- noted BP drop last night and drop in Hb by 1 gram - given bolused 500 cc NS and d/c'ddiuretics -  Eliquis held  - 9/22- Hb stable at 8-9 range- no signs of bleeding- will continue Eliquis   Active Problems: Acute on chronic hypercarbic resp failure/ Acute HRrEF/ Acute PE - has been adequately diuresed and is quite stable now - on K replacement - cont CPAP at bedtime  - incentive spirometry ordered - hold diuretics - BP is improving  - on room air at this time with no signs of fluid overload  Action/Plan: Following for progression of care. Following for cm needs, none present at this time, no discharge plans at this time.  Expected Discharge Date:  08/22/18               Expected Discharge Plan:  Skilled Nursing Facility  In-House Referral:  Clinical Social Work  Discharge planning Services  CM Consult  Post Acute Care Choice:  NA Choice offered to:  NA  DME Arranged:  N/A DME Agency:  NA  HH Arranged:  NA HH Agency:  NA  Status of Service:  Completed, signed off  If discussed at [employer] of Stay Meetings, dates discussed:    Additional Comments:  Leeroy Cha, RN 09/21/2018, 10:04 AM

## 2018-09-21 NOTE — Progress Notes (Signed)
Patient continues to decline nocturnal CPAP tonight. Equipment remains on standby. RN aware. RT will continue to follow.

## 2018-09-21 NOTE — Progress Notes (Signed)
Physical Therapy Treatment Patient Details Name: [name] MRN: [id number] DOB: 06/17/1941 Today's Date: 09/21/2018    History of Present Illness Pt admitted with melena, GIB and hypotension.  Pt with hx of DM, Bil THR, chronic venous insufficiency, chronic osteomyelitis of lower leg, Pagets Disease, and R wrist fusion    PT Comments    Pt in bed on 2 lts at 100%.  Pt incont stools. Assisted with side to side rolling required + 2 Total Assist pt 10%.  Side to side rolling to apply Sky Pad.   Assisted OON vis Sky Lift + 2 assist.  Positioned in recliner to comfort with multiple pillows.  Rec nursing use lift to assist pt back to bed.  Follow Up Recommendations  SNF     Equipment Recommendations  None recommended by PT    Recommendations for Other Services       Precautions / Restrictions Precautions Precautions: Fall Precaution Comments: has NOT stood on his feet in 2 months Restrictions Weight Bearing Restrictions: No    Mobility  Bed Mobility Overal bed mobility: Needs Assistance Bed Mobility: Rolling;Supine to Sit;Sit to Supine Rolling: Max assist;Total assist;+2 for physical assistance;+2 for safety/equipment   Supine to sit: Mod assist;+2 for physical assistance;HOB elevated     General bed mobility comments: rolling side to side for hygiene 2nd loose stools and Maxi Move placement  Transfers Overall transfer level: Needs assistance               General transfer comment: used Maxi Sky due to habitus and weakness`  Ambulation/Gait                 Marine scientist Rankin (Stroke Patients Only)       Balance                                            Cognition Arousal/Alertness: Awake/alert Behavior During Therapy: WFL for tasks assessed/performed Overall Cognitive Status: Within Functional Limits for tasks assessed                                 General  Comments: very pleasant       Exercises      General Comments        Pertinent Vitals/Pain Pain Assessment: No/denies pain    Home Living                      Prior Function            PT Goals (current goals can now be found in the care plan section) Progress towards PT goals: Progressing toward goals    Frequency    Min 2X/week      PT Plan Current plan remains appropriate    Co-evaluation              AM-PAC PT "6 Clicks" Daily Activity  Outcome Measure  Difficulty turning over in bed (including adjusting bedclothes, sheets and blankets)?: Unable Difficulty moving from lying on back to sitting on the side of the bed? : Unable Difficulty sitting down on and standing up from a chair with arms (e.g., wheelchair, bedside commode, etc,.)?: Unable Help needed moving to and from  a bed to chair (including a wheelchair)?: Total Help needed walking in hospital room?: Total Help needed climbing 3-5 steps with a railing? : Total 6 Click Score: 6    End of Session Equipment Utilized During Treatment: Oxygen Activity Tolerance: Patient limited by fatigue Patient left: in chair;with call bell/phone within reach Nurse Communication: Need for lift equipment PT Visit Diagnosis: Muscle weakness (generalized) (M62.81)     Time: 8209-9068 PT Time Calculation (min) (ACUTE ONLY): 25 min  Charges:  $Therapeutic Activity: 23-37 mins                     Rica Koyanagi  PTA Acute  Rehabilitation Services Pager      [phone] Office      [phone]

## 2018-09-21 NOTE — Progress Notes (Signed)
Pt refused all 2200 meds this shift. Pt stated that he was given too much medication during day shift and that is why he was so drowsy. 2nd RN also spoke with pt to try and encourage him to take his meds and he still refused. Pt's BS was 45 @ 2206. Pt was given grape juice w/ sugar and BS came up to 102. Pt is A&O X 4. Pt is beginning to refuse more and more of his care. ( CBG's CPAP) This nurse questioned pt regarding "feeling down or depressed." He emphatically stated that he is not depressed.

## 2018-09-21 NOTE — Progress Notes (Signed)
PROGRESS NOTE    [name]   IWL:[id number]  DOB: 1941/04/19  DOA: 08/19/2018 PCP: Samnorwood Bing, DO   Brief Narrative:  [name] is a 77 y/o with PMH of DM with neuropathy, chronic venous insufficiency, chronic osteomyelitis, chronic systolic and diastolic CHF (EF 17-40%), ischemic cardiomyopathy, HTN, Prostate CA who was being treated for c diff colitis as outpt and admitted for melena, hypotension, AKI with a Hb of 6.7 and a BUN/Cr of 111/2.1.  7/30-8/8>> He had a hospitalization for sepsis (urine/ pneumonia/ soft tissue) & PEA cardiac arrest suspected to be from a CHF exacerbation with anew drop in EF to 35%.    After admission on 8/21, he was stabilized with blood transfusions and his bleeding resolved. He was going to be discharged on 8/24 but then had a large BM with drop in Hb. EGD 8/29 > large duodenal bulb ulcer with the large visible vessel and evidence of recent bleeding- treated with epinephrine injection and gold probe cautery. Patient had oozing of blood after gold probe cautery and subsequently Hemo spray was used with achievement of hemostasis. 8/30 - RUE PICC placed 9/4 he was found to have a PE and started on Heparin infusion. LE venous duplex negative on 9/9 9/6 palliative care consulted 9/7 developed hematemesis, hypotension and transferred to ICU for pressors- anticoagulation reversed and transfused PRBC- embolized by IR 9/9 - pressors stopped- transferred out of ICU   9/10 repeat EGD show no further bleeding 9/13 transferred back to SDU for acute resp failure related to fluid overloaded-   9/16 found to have RUE DVT related to PICC- Heparin resumed without bolus. 9/17 cardiology consulted for new systolic CHF evaluation, noted to have abnormal EKG (anterolateral T wave changes), Troponin 0.06- determined to not be a candidate for further intervention due to inability to adequately anticoagulate if stenting needed. Cardiology signed off. 9/18 Levophed  tapered off  9/19 transferred to [facility]   Subjective: He has no complaints today.    Assessment & Plan:   Principal Problem: Hypovolemic shock due to GI bleed - due to duodenal ulcer - Eliqus was reversed with Andexxa - repeat EGD on 9/10 showed bleeding had resolved - cont PPI BID and Carafate - 9/19- Eliquis on hold and on Heparin - 9/20- as there has been no further bleeding, will transition back to Eliquis today and cautiously monitor in SDU for now - 9/21- noted BP drop last night and drop in Hb by 1 gram - given bolused 500 cc NS and d/c'd diuretics -  Eliquis held  - 9/22- Hb stable at 8-9 range- no signs of bleeding- will continue Eliquis Still no signs of bleeding- cont Eliquis   Active Problems: Acute on chronic hypercarbic resp failure/ Acute HRrEF/ Acute PE - has been adequately diuresed and is quite stable now - on K replacement - cont CPAP at bedtime  - incentive spirometry ordered - hold diuretics - BP is improving  - on room air at this time with no signs of fluid overload   AKI - Cr 2.1 on 8/21 - has since been in 0.7 - 1.1 range and is stable - follow    Morbid obesity   Body mass index is 42.64 kg/m.    Essential hypertension - Losartan, Metoprolol on hold due to hypotensive episodes-BP rising- will resume  ? Adrenal insufficiency - plasma cortisol checked on 9/16 was 10.8 - currently on Cortef which was started by ICU team-    Leukocytosis -  no signs of infection at this time- following    Chronic venous insufficiency, Bilateral Legs - has Unna boots    Malnutrition of moderate degree - cont Prostat and Boost    DVT prophylaxis: Heparin infusion Code Status: No CPR, No intubation- can use BiPAP, administer ACLS meds, Cardiovert if needed Family Communication:   Disposition Plan: follow in SDU for bleeding Consultants:   GI  IR  PCCM  Palliative care  Cardiology Procedures:  EGD 8/29 >> large duodenal ulcer with  visible vessel, biopsy c/w chronic gastritis, focal inflammation  EGD 9/10 >> no bleeding LE Doppler 9/9 >> negative   Antimicrobials:  Anti-infectives (From admission, onward)   Start     Dose/Rate Route Frequency Ordered Stop   08/22/18 0000  vancomycin (VANCOCIN) 50 mg/mL oral solution     125 mg Oral 4 times daily 08/22/18 1323     08/19/18 1930  vancomycin (VANCOCIN) 50 mg/mL oral solution 125 mg     125 mg Oral 4 times daily 08/19/18 1812 08/29/18 1759       Objective: Vitals:   09/21/18 0412 09/21/18 0500 09/21/18 0700 09/21/18 0745  BP:      Pulse:  (!) 58 (!) 53   Resp:  15 16   Temp: (!) 97.4 F (36.3 C)   97.9 F (36.6 C)  TempSrc: Oral   Oral  SpO2:  92% 97%   Weight:      Height:        Intake/Output Summary (Last 24 hours) at 09/21/2018 1129 Last data filed at 09/20/2018 1753 Gross per 24 hour  Intake -  Output 1125 ml  Net -1125 ml   Filed Weights   09/18/18 0442 09/19/18 0500 09/20/18 0437  Weight: (!) 152.1 kg (!) 144.3 kg (!) 142.6 kg    Examination: General exam: Appears comfortable  HEENT: PERRLA, oral mucosa moist, no sclera icterus or thrush Respiratory system: Clear to auscultation. Respiratory effort normal. Cardiovascular system: S1 & S2 heard, RRR.   Gastrointestinal system: Abdomen soft, non-tender, nondistended. Normal bowel sound. No organomegaly Central nervous system: Alert and oriented. No focal neurological deficits. Extremities: No cyanosis, clubbing - 2 + edema of right forearm and hand- legs are wrapped in Unna boots Skin: No rashes or ulcers Psychiatry:  Mood & affect appropriate.     Data Reviewed: I have personally reviewed following labs and imaging studies  CBC: Recent Labs  Lab 09/18/18 0404 09/19/18 0316 09/19/18 0809 09/19/18 1700 09/20/18 0035  WBC 17.8* 23.9* 21.2* 29.0* 23.1*  HGB 9.2* 7.7* 8.1* 8.9* 8.4*  HCT 31.6* 24.6* 26.2* 27.4* 26.4*  MCV 97.8 93.2 93.2 89.0 90.7  PLT 135* 128* 143* 137* 182    Basic Metabolic Panel: Recent Labs  Lab 09/15/18 0254 09/18/18 0404 09/19/18 0316 09/20/18 0035  NA 138 143 140 142  K 4.4 4.3 4.4 4.0  CL 99 104 102 101  CO2 33* 32 32 34*  GLUCOSE 249* 97 100* 116*  BUN 16 27* 35* 32*  CREATININE 0.97 1.07 1.17 1.09  CALCIUM 7.9* 8.2* 7.8* 8.0*   GFR: Estimated Creatinine Clearance: 81.8 mL/min (by C-G formula based on SCr of 1.09 mg/dL). Liver Function Tests: Recent Labs  Lab 09/14/18 1549 09/15/18 0254 09/16/18 1500  AST 55* 50* 28  ALT 34 40 33  ALKPHOS 186* 198* 191*  BILITOT 0.8 0.8 0.8  PROT 5.3* 5.5* 5.7*  ALBUMIN 2.0* 2.0* 1.9*   No results for input(s): LIPASE, AMYLASE in the last 168  hours. No results for input(s): AMMONIA in the last 168 hours. Coagulation Profile: Recent Labs  Lab 09/14/18 1715  INR 1.17   Cardiac Enzymes: Recent Labs  Lab 09/14/18 1549 09/14/18 2034 09/15/18 0234  CKTOTAL 41*  --   --   TROPONINI 0.07* 0.06* 0.06*   BNP (last 3 results) No results for input(s): PROBNP in the last 8760 hours. HbA1C: No results for input(s): HGBA1C in the last 72 hours. CBG: Recent Labs  Lab 09/20/18 0740 09/20/18 1230 09/20/18 1630 09/20/18 2133 09/21/18 0739  GLUCAP 92 142* 167* 125* 89   Lipid Profile: No results for input(s): CHOL, HDL, LDLCALC, TRIG, CHOLHDL, LDLDIRECT in the last 72 hours. Thyroid Function Tests: No results for input(s): TSH, T4TOTAL, FREET4, T3FREE, THYROIDAB in the last 72 hours. Anemia Panel: No results for input(s): VITAMINB12, FOLATE, FERRITIN, TIBC, IRON, RETICCTPCT in the last 72 hours. Urine analysis:    Component Value Date/Time   COLORURINE YELLOW 09/16/2018 1817   APPEARANCEUR CLEAR 09/16/2018 1817   LABSPEC 1.005 09/16/2018 1817   PHURINE 5.0 09/16/2018 1817   GLUCOSEU NEGATIVE 09/16/2018 1817   HGBUR SMALL (A) 09/16/2018 1817   HGBUR negative 06/08/2007 1320   BILIRUBINUR NEGATIVE 09/16/2018 1817   BILIRUBINUR NEG 02/25/2012 1351   KETONESUR NEGATIVE  09/16/2018 1817   PROTEINUR NEGATIVE 09/16/2018 1817   UROBILINOGEN 1.0 11/10/2012 2115   NITRITE NEGATIVE 09/16/2018 1817   LEUKOCYTESUR NEGATIVE 09/16/2018 1817   Sepsis Labs: @LABRCNTIP (procalcitonin:4,lacticidven:4) ) Recent Results (from the past 240 hour(s))  Culture, blood (Routine X 2) w Reflex to ID Panel     Status: None   Collection Time: 09/14/18  3:50 PM  Result Value Ref Range Status   Specimen Description   Final    BLOOD RIGHT ANTECUBITAL Performed at [facility], [place] [address] [place], [place] [address]    Special Requests   Final    BOTTLES DRAWN AEROBIC AND ANAEROBIC Blood Culture adequate volume Performed at [facility] [address], [place], [place] [address]    Culture   Final    NO GROWTH 5 DAYS Performed at [facility], [place] [address] [place], [place] [address]    Report Status 09/19/2018 FINAL  Final  Culture, blood (Routine X 2) w Reflex to ID Panel     Status: None   Collection Time: 09/14/18  3:51 PM  Result Value Ref Range Status   Specimen Description   Final    BLOOD RIGHT ANTECUBITAL Performed at [facility] [address] [place], [place] [address]    Special Requests   Final    BOTTLES DRAWN AEROBIC ONLY Blood Culture adequate volume Performed at [facility] [address] [place], [place] [address]    Culture   Final    NO GROWTH 5 DAYS Performed at [facility], [place] [address], [place],  [address]    Report Status 09/19/2018 FINAL  Final         Radiology Studies: No results found.    Scheduled Meds: . apixaban  5 mg Oral BID  . chlorhexidine  15 mL Mouth Rinse BID  . feeding supplement  1 Container Oral BID BM  . feeding supplement (PRO-STAT SUGAR FREE 64)  30 mL Oral BID  . hydrocortisone  20 mg Oral Daily  . insulin aspart  0-20 Units Subcutaneous TID AC & HS  . latanoprost  1 drop Both Eyes QHS  . mouth rinse  15 mL Mouth Rinse q12n4p  . multivitamin with minerals  1 tablet Oral Daily  . pantoprazole  40 mg Oral BID AC  . sucralfate  1 g Oral TID WC & HS  . thiamine  100 mg Oral Daily   Continuous Infusions: . dextrose 10 mL/hr at 09/18/18 1135     LOS: 33 days    Time spent in minutes: [place], MD [facility] Pager: [email or web address] Password [facility] 09/21/2018, 11:29 AM

## 2018-09-22 LAB — GLUCOSE, CAPILLARY
GLUCOSE-CAPILLARY: 125 mg/dL — AB (ref 70–99)
GLUCOSE-CAPILLARY: 147 mg/dL — AB (ref 70–99)
GLUCOSE-CAPILLARY: 133 mg/dL — AB (ref 70–99)
Glucose-Capillary: 147 mg/dL — ABNORMAL HIGH (ref 70–99)

## 2018-09-22 LAB — BASIC METABOLIC PANEL
ANION GAP: 8 (ref 5–15)
BUN: 21 mg/dL (ref 8–23)
CALCIUM: 8.3 mg/dL — AB (ref 8.9–10.3)
CO2: 35 mmol/L — ABNORMAL HIGH (ref 22–32)
Chloride: 101 mmol/L (ref 98–111)
Creatinine, Ser: 0.71 mg/dL (ref 0.61–1.24)
GFR calc Af Amer: >60 mL/min (ref >60)
GFR calc non Af Amer: >60 mL/min (ref >60)
Glucose, Bld: 114 mg/dL — ABNORMAL HIGH (ref 70–99)
Potassium: 3.2 mmol/L — ABNORMAL LOW (ref 3.5–5.1)
SODIUM: 144 mmol/L (ref 135–145)

## 2018-09-22 LAB — CBC
HEMATOCRIT: 30.7 % — AB (ref 39.0–52.0)
Hemoglobin: 9.5 g/dL — ABNORMAL LOW (ref 13.0–17.0)
MCH: 28.5 pg (ref 26.0–34.0)
MCHC: 30.9 g/dL (ref 30.0–36.0)
MCV: 92.2 fL (ref 78.0–100.0)
PLATELETS: 225 10*3/uL (ref 150–400)
RBC: 3.33 MIL/uL — AB (ref 4.22–5.81)
RDW: 14.8 % (ref 11.5–15.5)
WBC: 20.8 10*3/uL — ABNORMAL HIGH (ref 4.0–10.5)

## 2018-09-22 MED ORDER — INSULIN ASPART 100 UNIT/ML ~~LOC~~ SOLN: 0.0000 [IU] | Freq: Every day | SUBCUTANEOUS | Status: DC

## 2018-09-22 MED ORDER — POTASSIUM CHLORIDE CRYS ER 20 MEQ PO TBCR
40.0000 meq | EXTENDED_RELEASE_TABLET | ORAL | Status: AC
  Administered 2018-09-22 (×2): 40 meq via ORAL
  Filled 2018-09-22 (×2): qty 2

## 2018-09-22 MED ORDER — INSULIN ASPART 100 UNIT/ML ~~LOC~~ SOLN
0.0000 [IU] | Freq: Three times a day (TID) | SUBCUTANEOUS | Status: DC
  Administered 2018-09-22 – 2018-09-23 (×4): 2 [IU] via SUBCUTANEOUS
  Administered 2018-09-23: 5 [IU] via SUBCUTANEOUS
  Administered 2018-09-24 – 2018-09-25 (×5): 2 [IU] via SUBCUTANEOUS
  Administered 2018-09-26: 3 [IU] via SUBCUTANEOUS
  Administered 2018-09-27 – 2018-09-28 (×3): 2 [IU] via SUBCUTANEOUS
  Administered 2018-09-28: 3 [IU] via SUBCUTANEOUS
  Administered 2018-09-29 (×2): 5 [IU] via SUBCUTANEOUS
  Administered 2018-09-30 – 2018-10-01 (×4): 3 [IU] via SUBCUTANEOUS

## 2018-09-22 MED ORDER — HYDROCORTISONE 10 MG PO TABS
10.0000 mg | ORAL_TABLET | Freq: Every day | ORAL | Status: DC
  Administered 2018-09-22 – 2018-10-01 (×10): 10 mg via ORAL
  Filled 2018-09-22 (×10): qty 1

## 2018-09-22 NOTE — Progress Notes (Signed)
Inpatient Diabetes Program Recommendations  AACE/ADA: New Consensus Statement on Inpatient Glycemic Control (2015)  Target Ranges:  Prepandial:   less than 140 mg/dL      Peak postprandial:   less than 180 mg/dL (1-2 hours)      Critically ill patients:  140 - 180 mg/dL   Results for HAIDAN, NHAN (MRN [id number]) as of 09/22/2018 09:20  Ref. Range 09/21/2018 07:39 09/21/2018 12:05 09/21/2018 17:05 09/21/2018 22:06 09/21/2018 22:52 09/22/2018 07:52  Glucose-Capillary Latest Ref Range: 70 - 99 mg/dL 89 Hydrocortisone 20 mg Given 190 (H) Novolog 4 units given 266 (H) Novolog 11 units given 45 (L) 102 (H) 125 (H)   Review of Glycemic Control  Current orders for Inpatient glycemic control: Novolog 0-15 units tid, Novolog 0-5 units qhs  Inpatient Diabetes Program Recommendations:    Due to patient age and history please reduce correction scale to 0-9 units tid. Patient had hypoglycemia due to amount of Novolog correction given for glucose in the 200 range.  Patient's Hydrocortisone is 10 mg today instead of 20 mg that the patient received yesterday.  Thanks,  Tama Headings RN, MSN, BC-ADM Inpatient Diabetes Coordinator Team Pager [phone] (8a-5p)

## 2018-09-22 NOTE — Consult Note (Signed)
   [facility] CM Inpatient Consult   09/22/2018  [name] 23-Apr-1941 [id number]   [facility] Care Management follow up.   Chart reviewed. [name] remains in stepdown at [facility]. Per chart review, noted CSW following for potential SNF authorization.   Will continue to follow for disposition planning.    Marthenia Rolling, MSN-Ed, RN,BSN [facility] Liaison [phone]

## 2018-09-22 NOTE — NC FL2 (Signed)
[place] LEVEL OF CARE SCREENING TOOL     IDENTIFICATION  Patient Name: [name] Birthdate: May 20, 1941 Sex: male Admission Date (Current Location): 08/19/2018  [facility] and [place] Number:  Herbalist and Address:  [facility],  [address], [place]      Provider Number: [id number]  Attending Physician Name and Address:  Debbe Odea, MD  Relative Name and Phone Number:  Cheri Rous: [phone]    Current Level of Care: Hospital Recommended Level of Care: [place] Prior Approval Number:    Date Approved/Denied:   PASRR Number: [id number]  Discharge Plan: SNF    Current Diagnoses: Patient Active Problem List   Diagnosis Date Noted  . Abdominal pain   . Leukocytosis   . Elevated alkaline phosphatase measurement   . Systolic CHF, acute on chronic ([place]) 09/11/2018  . Anasarca 09/11/2018  . Acute pulmonary embolism ([place]) 09/11/2018  . Acute respiratory failure ([place])   . Duodenal ulcer   . Malnutrition of moderate degree 08/24/2018  . Hemorrhagic shock ([place])   . Pressure injury of skin 08/20/2018  . Gastrointestinal hemorrhage 08/19/2018  . Acute respiratory failure with hypoxemia ([place])   . Possible OSA; on empiric CPAP 08/03/2018  . Fungus present in urine 08/03/2018  . Abdominal aortic atherosclerosis ([place]) 07/29/2018  . Pyuria 07/29/2018  . Sacral decubitus ulcer, stage III ([place]) 07/29/2018  . Somnolence 07/29/2018  . Acute respiratory acidosis 07/29/2018  . Poor personal hygiene 07/29/2018  . Hemoglobin decreased 07/29/2018  . Sepsis ([place]) 07/28/2018  . Pneumonia 06/04/2018  . Dehydration   . Possible HCAP (healthcare-associated pneumonia)   . Hypotension   . Grade II diastolic dysfunction   . Fluid overload 05/19/2018  . Elephantiasis nostra verrucosa 05/19/2018  . Pressure ulcer of upper thigh, right, stage III ([place]) 05/19/2018  . Candidal intertrigo 05/19/2018  . Chronic  venous insufficiency, Bilateral Legs 05/19/2018  . Elevated PSA, greater than or equal to 20 ng/ml 05/19/2018  . History of prostate surgery, Per Patient report, [facility] 05/19/2018  . Infestation by bed bug 05/19/2018  . Lives alone with limited help 05/19/2018  . Morbid obesity ([place]) 05/19/2018  . SOB (shortness of breath) 05/19/2018  . Systolic ejection murmur with radiation into Cartoids bilaterally 05/19/2018  . Paget's bone disease 05/19/2018  . Pulmonary hypertension ([place]) 05/19/2018  . Hypoxia   . AKI (acute kidney injury) ([place])   . Impaired mobility 09/09/2014  . Ulcer of left lower leg ([place]) 10/05/2012  . Encounter for central line placement 10/05/2012  . Anemia of chronic disease 05/21/2012  . Chronic leg pain 11/17/2011  . HLD (hyperlipidemia) 12/01/2008  . ADENOCARCINOMA, PROSTATE 05/24/2008  . Morbid obesity with body mass index of 50.0-59.9 in adult ([place]) 03/05/2007  . Essential hypertension 03/05/2007    Orientation RESPIRATION BLADDER Height & Weight     Self, Situation, Place, Time  Normal External catheter Weight: (!) 314 lb 6 oz (142.6 kg) Height:  6' (182.9 cm)  BEHAVIORAL SYMPTOMS/MOOD NEUROLOGICAL BOWEL NUTRITION STATUS      Incontinent Diet(See discharge summary )  AMBULATORY STATUS COMMUNICATION OF NEEDS Skin   Extensive Assist Verbally Other (Comment), PU Stage and Appropriate Care   PU Stage 2 Dressing: (Left Heel (change every 5 days), , Bilateral Legs-una boots over foam dressing )  Incision Pretibial (change every 5 days)   Sacrum Coccyx, Thigh (change every 3 days)  Foam dressing.  Personal Care Assistance Level of Assistance  Bathing, Feeding, Dressing Bathing Assistance: Maximum assistance Feeding assistance: Independent Dressing Assistance: Maximum assistance     Functional Limitations Info  Sight, Hearing, Speech Sight Info: Impaired Hearing Info: Impaired Speech Info: Adequate    SPECIAL CARE FACTORS  FREQUENCY  PT (By licensed PT), OT (By licensed OT)     PT Frequency: 5x/week  OT Frequency: 5x/week             Contractures Contractures Info: Not present    Additional Factors Info  Code Status, Allergies, Insulin Sliding Scale Code Status Info: Fullcode  Allergies Info: Allergies: Ace Inhibitors, Angiotensin Receptor Blockers, Metformin   Insulin Sliding Scale Info: See medication list        Current Medications (09/22/2018):  This is the current hospital active medication list Current Facility-Administered Medications  Medication Dose Route Frequency Provider Last Rate Last Dose  . apixaban (ELIQUIS) tablet 5 mg  5 mg Oral BID Debbe Odea, MD   5 mg at 09/22/18 0920  . chlorhexidine (PERIDEX) 0.12 % solution 15 mL  15 mL Mouth Rinse BID Clarene Essex, MD   15 mL at 09/21/18 0902  . dextrose 5 % solution   Intravenous Continuous Ollis, Brandi L, NP 10 mL/hr at 09/18/18 1135    . feeding supplement (BOOST / RESOURCE BREEZE) liquid 1 Container  1 Container Oral BID BM Icard, Bradley L, DO   1 Container at 09/22/18 0919  . feeding supplement (PRO-STAT SUGAR FREE 64) liquid 30 mL  30 mL Oral BID Ollis, Brandi L, NP   30 mL at 09/21/18 0902  . hydrocortisone (CORTEF) tablet 10 mg  10 mg Oral Daily Debbe Odea, MD   10 mg at 09/22/18 0919  . insulin aspart (novoLOG) injection 0-15 Units  0-15 Units Subcutaneous TID WC Debbe Odea, MD   2 Units at 09/22/18 0920  . insulin aspart (novoLOG) injection 0-5 Units  0-5 Units Subcutaneous QHS Rizwan, Saima, MD      . iohexol (OMNIPAQUE) 300 MG/ML solution 100 mL  100 mL Intravenous Once PRN Clarene Essex, MD      . latanoprost (XALATAN) 0.005 % ophthalmic solution 1 drop  1 drop Both Eyes QHS Clarene Essex, MD   1 drop at 09/20/18 2256  . MEDLINE mouth rinse  15 mL Mouth Rinse q12n4p Clarene Essex, MD   15 mL at 09/21/18 1708  . metoprolol tartrate (LOPRESSOR) tablet 12.5 mg  12.5 mg Oral BID Debbe Odea, MD   12.5 mg at 09/22/18 0919  .  ondansetron (ZOFRAN) injection 4 mg  4 mg Intravenous Q6H PRN Clarene Essex, MD   4 mg at 09/06/18 0552  . pantoprazole (PROTONIX) EC tablet 40 mg  40 mg Oral BID AC Clarene Essex, MD   40 mg at 09/22/18 0919  . potassium chloride SA (K-DUR,KLOR-CON) CR tablet 40 mEq  40 mEq Oral Q4H Debbe Odea, MD   40 mEq at 09/22/18 0919  . sucralfate (CARAFATE) tablet 1 g  1 g Oral TID WC & HS Clarene Essex, MD   1 g at 09/22/18 0919     Discharge Medications: Please see discharge summary for a list of discharge medications.  Relevant Imaging Results:  Relevant Lab Results:   Additional Information SSN: [id number]  [name], LCSW

## 2018-09-22 NOTE — Progress Notes (Signed)
Patient continues to decline nocturnal CPAP.  

## 2018-09-22 NOTE — Progress Notes (Signed)
CSW following to assist with discharge planning to SNF. CSW discussed discharge planning with patient at bedside. Patient remembers going to [facility] for short rehab snf and prefers to return at discharge. Patient declines long term care at SNF.   Patient currently does not have a long term care payor source and cannot pay privately at [facility].    Per notes from [facility] 8/27 patient was denied rehab stay at [facility] by insurance.   Per palliative 9/23 patient is requires full assistance with ADL and is incontinent now in the bed.   CSW initiated insurance authorization to determine if they will authorize a rehab stay.   Patient gave CSW permission to call his sister to discuss d/c planning. CSW left voicemail for patient sister Ivin Booty.    Kathrin Greathouse, Marlinda Mike, MSW Clinical Social Worker  [phone] 09/22/2018  10:39 AM

## 2018-09-22 NOTE — Progress Notes (Signed)
PROGRESS NOTE    [name]   OZH:[id number]  DOB: 1941-01-05  DOA: 08/19/2018 PCP: [place] Bing, DO   Brief Narrative:  [name] is a 77 y/o with PMH of DM with neuropathy, chronic venous insufficiency, chronic osteomyelitis, chronic systolic and diastolic CHF (EF 62-95%), ischemic cardiomyopathy, HTN, Prostate CA who was being treated for c diff colitis as outpt and admitted for melena, hypotension, AKI with a Hb of 6.7 and a BUN/Cr of 111/2.1.  7/30-8/8>> He had a hospitalization for sepsis (urine/ pneumonia/ soft tissue) & PEA cardiac arrest suspected to be from a CHF exacerbation with anew drop in EF to 35%.    After admission on 8/21, he was stabilized with blood transfusions and his bleeding resolved. He was going to be discharged on 8/24 but then had a large BM with drop in Hb. EGD 8/29 > large duodenal bulb ulcer with the large visible vessel and evidence of recent bleeding- treated with epinephrine injection and gold probe cautery. Patient had oozing of blood after gold probe cautery and subsequently Hemo spray was used with achievement of hemostasis. 8/30 - RUE PICC placed 9/4 he was found to have a PE and started on Heparin infusion. LE venous duplex negative on 9/9 9/6 palliative care consulted 9/7 developed hematemesis, hypotension and transferred to ICU for pressors- anticoagulation reversed and transfused PRBC- embolized by IR 9/9 - pressors stopped- transferred out of ICU   9/10 repeat EGD show no further bleeding 9/13 transferred back to SDU for acute resp failure related to fluid overloaded-   9/16 found to have RUE DVT related to PICC- Heparin resumed without bolus. 9/17 cardiology consulted for new systolic CHF evaluation, noted to have abnormal EKG (anterolateral T wave changes), Troponin 0.06- determined to not be a candidate for further intervention due to inability to adequately anticoagulate if stenting needed. Cardiology signed off. 9/18 Levophed  tapered off  9/19 transferred to [facility]   Subjective: No complaints. He has been declining may things. We have discussed cutting back on some of his medications and keeping the most necessary ones for now.    Assessment & Plan:   Principal Problem: Hypovolemic shock due to GI bleed - due to duodenal ulcer - Eliqus was reversed with Andexxa - repeat EGD on 9/10 showed bleeding had resolved - cont PPI BID and Carafate - 9/19- Eliquis on hold and on Heparin - 9/20- as there has been no further bleeding, will transition back to Eliquis today and cautiously monitor in SDU for now - 9/21- noted BP drop last night and drop in Hb by 1 gram - given bolused 500 cc NS and d/c'd diuretics -  Eliquis held  - 9/22- Hb stable at 8-9 range- no signs of bleeding- will continue Eliquis     Active Problems: Acute on chronic hypercarbic resp failure/ Acute PE - has been adequately diuresed and is quite stable now - on K replacement - cont CPAP at bedtime  - incentive spirometry ordered - holding diuretics -  - on room air at this time with no signs of fluid overload- ECHO on 9/17 shows normal EF and no mention of dCHF   AKI - Cr 2.1 on 8/21 - has since been in 0.7 - 1.1 range and is stable - follow    Morbid obesity   Body mass index is 42.64 kg/m.    Essential hypertension - Losartan, Metoprolol on hold due to hypotensive episodes-BP rising- will resume  ? Adrenal insufficiency -  plasma cortisol checked on 9/16 was 10.8 - currently on Cortef which was started by ICU team-    Leukocytosis - no signs of infection at this time- following    Chronic venous insufficiency, Bilateral Legs - has Unna boots    Malnutrition of moderate degree - cont Prostat and Boost    DVT prophylaxis: Heparin infusion Code Status: No CPR, No intubation- can use BiPAP, administer ACLS meds, Cardiovert if needed Family Communication:   Disposition Plan: follow in SDU for bleeding- awaiting SNF  bed Consultants:   GI  IR  PCCM  Palliative care  Cardiology Procedures:  EGD 8/29 >> large duodenal ulcer with visible vessel, biopsy c/w chronic gastritis, focal inflammation  EGD 9/10 >> no bleeding LE Doppler 9/9 >> negative  ECHO:  Left ventricle: The cavity size was normal. Systolic function was   normal. The estimated ejection fraction was in the range of 60%   to 65%. Wall motion was normal; there were no regional wall   motion abnormalities. - Aortic valve: Transvalvular velocity was within the normal range.   There was no stenosis. There was mild regurgitation. - Mitral valve: Mildly calcified annulus. Mildly thickened leaflets   . Transvalvular velocity was within the normal range. There was   no evidence for stenosis. There was no regurgitation. - Right ventricle: The cavity size was normal. Wall thickness was   normal. Systolic function was normal. - Tricuspid valve: There was no regurgitation. - Pulmonary arteries: Systolic pressure was within the normal   range. PA peak pressure: 30 mm Hg (S).    Antimicrobials:  Anti-infectives (From admission, onward)   Start     Dose/Rate Route Frequency Ordered Stop   08/22/18 0000  vancomycin (VANCOCIN) 50 mg/mL oral solution     125 mg Oral 4 times daily 08/22/18 1323     08/19/18 1930  vancomycin (VANCOCIN) 50 mg/mL oral solution 125 mg     125 mg Oral 4 times daily 08/19/18 1812 08/29/18 1759      Objective: Vitals:   09/22/18 0754 09/22/18 0800 09/22/18 0922 09/22/18 1201  BP:  (!) 111/40 (!) 113/43   Pulse:  61 61   Resp:  20 15   Temp: (!) 97.5 F (36.4 C)   98.5 F (36.9 C)  TempSrc: Axillary   Axillary  SpO2:  100% 99%   Weight:      Height:        Intake/Output Summary (Last 24 hours) at 09/22/2018 1350 Last data filed at 09/22/2018 1115 Gross per 24 hour  Intake 240 ml  Output -  Net 240 ml   Filed Weights   09/18/18 0442 09/19/18 0500 09/20/18 0437  Weight: (!) 152.1 kg (!) 144.3 kg (!)  142.6 kg    Examination: General exam: Appears comfortable  HEENT: PERRLA, oral mucosa moist, no sclera icterus or thrush Respiratory system: Clear to auscultation. Respiratory effort normal. Cardiovascular system: S1 & S2 heard, RRR.   Gastrointestinal system: Abdomen soft, non-tender, nondistended. Normal bowel sound. No organomegaly Central nervous system: Alert and oriented. No focal neurological deficits. Extremities: No cyanosis, clubbing - 2 + edema of right forearm and hand- legs are wrapped in Unna boots Skin: No rashes or ulcers Psychiatry:  Mood & affect appropriate.     Data Reviewed: I have personally reviewed following labs and imaging studies  CBC: Recent Labs  Lab 09/19/18 0809 09/19/18 1700 09/20/18 0035 09/21/18 1330 09/22/18 0233  WBC 21.2* 29.0* 23.1* 15.8* 20.8*  HGB  8.1* 8.9* 8.4* 10.5* 9.5*  HCT 26.2* 27.4* 26.4* 33.5* 30.7*  MCV 93.2 89.0 90.7 92.3 92.2  PLT 143* 137* 154 205 409   Basic Metabolic Panel: Recent Labs  Lab 09/18/18 0404 09/19/18 0316 09/20/18 0035 09/21/18 1330 09/22/18 0233  NA 143 140 142 145 144  K 4.3 4.4 4.0 3.2* 3.2*  CL 104 102 101 101 101  CO2 32 32 34* 36* 35*  GLUCOSE 97 100* 116* 189* 114*  BUN 27* 35* 32* 28* 21  CREATININE 1.07 1.17 1.09 0.79 0.71  CALCIUM 8.2* 7.8* 8.0* 8.4* 8.3*   GFR: Estimated Creatinine Clearance: 111.5 mL/min (by C-G formula based on SCr of 0.71 mg/dL). Liver Function Tests: Recent Labs  Lab 09/16/18 1500  AST 28  ALT 33  ALKPHOS 191*  BILITOT 0.8  PROT 5.7*  ALBUMIN 1.9*   No results for input(s): LIPASE, AMYLASE in the last 168 hours. No results for input(s): AMMONIA in the last 168 hours. Coagulation Profile: No results for input(s): INR, PROTIME in the last 168 hours. Cardiac Enzymes: No results for input(s): CKTOTAL, CKMB, CKMBINDEX, TROPONINI in the last 168 hours. BNP (last 3 results) No results for input(s): PROBNP in the last 8760 hours. HbA1C: No results for  input(s): HGBA1C in the last 72 hours. CBG: Recent Labs  Lab 09/21/18 1705 09/21/18 2206 09/21/18 2252 09/22/18 0752 09/22/18 1159  GLUCAP 266* 45* 102* 125* 147*   Lipid Profile: No results for input(s): CHOL, HDL, LDLCALC, TRIG, CHOLHDL, LDLDIRECT in the last 72 hours. Thyroid Function Tests: No results for input(s): TSH, T4TOTAL, FREET4, T3FREE, THYROIDAB in the last 72 hours. Anemia Panel: No results for input(s): VITAMINB12, FOLATE, FERRITIN, TIBC, IRON, RETICCTPCT in the last 72 hours. Urine analysis:    Component Value Date/Time   COLORURINE YELLOW 09/16/2018 1817   APPEARANCEUR CLEAR 09/16/2018 1817   LABSPEC 1.005 09/16/2018 1817   PHURINE 5.0 09/16/2018 1817   GLUCOSEU NEGATIVE 09/16/2018 1817   HGBUR SMALL (A) 09/16/2018 1817   HGBUR negative 06/08/2007 1320   BILIRUBINUR NEGATIVE 09/16/2018 1817   BILIRUBINUR NEG 02/25/2012 1351   KETONESUR NEGATIVE 09/16/2018 1817   PROTEINUR NEGATIVE 09/16/2018 1817   UROBILINOGEN 1.0 11/10/2012 2115   NITRITE NEGATIVE 09/16/2018 1817   LEUKOCYTESUR NEGATIVE 09/16/2018 1817   Sepsis Labs: @LABRCNTIP (procalcitonin:4,lacticidven:4) ) Recent Results (from the past 240 hour(s))  Culture, blood (Routine X 2) w Reflex to ID Panel     Status: None   Collection Time: 09/14/18  3:50 PM  Result Value Ref Range Status   Specimen Description   Final    BLOOD RIGHT ANTECUBITAL Performed at [facility], [place] [address], [place], [place] [address]    Special Requests   Final    BOTTLES DRAWN AEROBIC AND ANAEROBIC Blood Culture adequate volume Performed at [place] [address] [place], [place] [address]    Culture   Final    NO GROWTH 5 DAYS Performed at [facility], [place] [address], [place], [place] [address]    Report Status 09/19/2018 FINAL  Final  Culture, blood (Routine X 2) w Reflex to ID Panel     Status: None   Collection Time: 09/14/18  3:51 PM  Result Value Ref Range  Status   Specimen Description   Final    BLOOD RIGHT ANTECUBITAL Performed at [place] [address], [place], [place] [address]    Special Requests   Final    BOTTLES DRAWN  AEROBIC ONLY Blood Culture adequate volume Performed at [place] [address], [place], [place] [address]    Culture   Final    NO GROWTH 5 DAYS Performed at [facility], [place] [address] [place], [place] [address]    Report Status 09/19/2018 FINAL  Final         Radiology Studies: No results found.    Scheduled Meds: . apixaban  5 mg Oral BID  . chlorhexidine  15 mL Mouth Rinse BID  . feeding supplement  1 Container Oral BID BM  . feeding supplement (PRO-STAT SUGAR FREE 64)  30 mL Oral BID  . hydrocortisone  10 mg Oral Daily  . insulin aspart  0-15 Units Subcutaneous TID WC  . insulin aspart  0-5 Units Subcutaneous QHS  . latanoprost  1 drop Both Eyes QHS  . mouth rinse  15 mL Mouth Rinse q12n4p  . metoprolol tartrate  12.5 mg Oral BID  . pantoprazole  40 mg Oral BID AC  . sucralfate  1 g Oral TID WC & HS   Continuous Infusions: . dextrose 10 mL/hr at 09/18/18 1135     LOS: 34 days    Time spent in minutes: 35    Debbe Odea, MD [facility] Pager: [email or web address] Password [email or web address] 09/22/2018, 1:50 PM

## 2018-09-23 LAB — CBC
HCT: 31.1 % — ABNORMAL LOW (ref 39.0–52.0)
Hemoglobin: 9.4 g/dL — ABNORMAL LOW (ref 13.0–17.0)
MCH: 28.3 pg (ref 26.0–34.0)
MCHC: 30.2 g/dL (ref 30.0–36.0)
MCV: 93.7 fL (ref 78.0–100.0)
PLATELETS: 255 10*3/uL (ref 150–400)
RBC: 3.32 MIL/uL — ABNORMAL LOW (ref 4.22–5.81)
RDW: 14.9 % (ref 11.5–15.5)
WBC: 10.9 10*3/uL — AB (ref 4.0–10.5)

## 2018-09-23 LAB — BASIC METABOLIC PANEL
Anion gap: 4 — ABNORMAL LOW (ref 5–15)
BUN: 18 mg/dL (ref 8–23)
CALCIUM: 8.4 mg/dL — AB (ref 8.9–10.3)
CO2: 37 mmol/L — AB (ref 22–32)
CREATININE: 0.76 mg/dL (ref 0.61–1.24)
Chloride: 103 mmol/L (ref 98–111)
GFR calc Af Amer: >60 mL/min (ref >60)
GFR calc non Af Amer: >60 mL/min (ref >60)
Glucose, Bld: 129 mg/dL — ABNORMAL HIGH (ref 70–99)
Potassium: 3.7 mmol/L (ref 3.5–5.1)
Sodium: 144 mmol/L (ref 135–145)

## 2018-09-23 LAB — GLUCOSE, CAPILLARY
GLUCOSE-CAPILLARY: 110 mg/dL — AB (ref 70–99)
Glucose-Capillary: 127 mg/dL — ABNORMAL HIGH (ref 70–99)
Glucose-Capillary: 247 mg/dL — ABNORMAL HIGH (ref 70–99)
Glucose-Capillary: 153 mg/dL — ABNORMAL HIGH (ref 70–99)

## 2018-09-23 NOTE — Care Management Note (Signed)
Case Management Note  Patient Details  Name: [name] MRN: [id number] Date of Birth: Feb 22, 1941  Subjective/Objective:                  Insurance has denied rehab bed may need snf bed,patient refusing most of care at this point.  Action/Following for progression of care and level of care. Following for cm needs, none present at this time.Plan:   Expected Discharge Date:  08/22/18               Expected Discharge Plan:  [place]  In-House Referral:  Clinical Social Work  Discharge planning Services  CM Consult  Post Acute Care Choice:  NA Choice offered to:  NA  DME Arranged:  N/A DME Agency:  NA  HH Arranged:  NA HH Agency:  NA  Status of Service:  Completed, signed off  If discussed at [employer] of Stay Meetings, dates discussed:    Additional Comments:  Leeroy Cha, RN 09/23/2018, 10:16 AM

## 2018-09-23 NOTE — Progress Notes (Signed)
PROGRESS NOTE    [name]  WLN:[id number] DOB: 07-05-1941 DOA: 08/19/2018 PCP: Porcupine Bing, DO   Brief Narrative:  [name] is a 77 y/o with PMH of DM with neuropathy, chronic venous insufficiency, chronic osteomyelitis, chronic systolic and diastolic CHF (EF 74-08%), ischemic cardiomyopathy, HTN, Prostate CA who was being treated for c diff colitis as outpt and admitted for melena, hypotension, AKI with a Hb of 6.7 and a BUN/Cr of 111/2.1.  7/30-8/8>> He had a hospitalization for sepsis (urine/ pneumonia/ soft tissue) & PEA cardiac arrest suspected to be from a CHF exacerbation with anew drop in EF to 35%.    After admission on 8/21, he was stabilized with blood transfusions and his bleeding resolved. He was going to be discharged on 8/24 but then had a large BM with drop in Hb. EGD 8/29 > large duodenal bulb ulcer with the large visible vessel and evidence of recent bleeding- treated with epinephrine injection and gold probe cautery. Patient had oozing of blood after gold probe cautery and subsequently Hemo spray was used with achievement of hemostasis. 8/30 - RUE PICC placed 9/4 he was found to have a PE and started on Heparin infusion. LE venous duplex negative on 9/9 9/6 palliative care consulted 9/7 developed hematemesis, hypotension and transferred to ICU for pressors- anticoagulation reversed and transfused PRBC- embolized by IR 9/9 - pressors stopped- transferred out of ICU   9/10 repeat EGD show no further bleeding 9/13 transferred back to SDU for acute resp failure related to fluid overloaded-   9/16 found to have RUE DVT related to PICC- Heparin resumed without bolus. 9/17 cardiology consulted for new systolic CHF evaluation, noted to have abnormal EKG (anterolateral T wave changes), Troponin 0.06- determined to not be a candidate for further intervention due to inability to adequately anticoagulate if stenting needed. Cardiology signed off. 9/18 Levophed  tapered off  9/19 transferred to [facility]   Assessment & Plan:   Principal Problem:   Systolic CHF, acute on chronic [facility]) Active Problems:   Morbid obesity with body mass index of 50.0-59.9 in adult [facility])   Essential hypertension   Encounter for central line placement   Impaired mobility   Fluid overload   Chronic venous insufficiency, Bilateral Legs   SOB (shortness of breath)   Gastrointestinal hemorrhage   Pressure injury of skin   Hemorrhagic shock (HCC)   Malnutrition of moderate degree   Anasarca   Acute pulmonary embolism (HCC)   Acute respiratory failure (HCC)   Duodenal ulcer   Abdominal pain   Leukocytosis   Elevated alkaline phosphatase measurement   Hypovolemic shock:  Secondary to GI bleed from duodenal ulcer. Patient had upper endoscopy on September 10.  And his Eliquis was restarted.  His hemoglobin appears to be stable between 8-9 range.    Acute on chronic hypercarbic respiratory failure/acute pulmonary embolism Appears to have resolved.  Resume CPAP at night.   Acute kidney injury patient had creatinine of 2.1 on August 21.  Creatinine has been stable between 0.7-1.1.   Morbid obesity    Hypertension well-controlled    Adrenal insufficiency currently on Cortef   Chronic venous insufficiency Unna boots    Nutrition of moderate degree continue Protostat and boost.  DVT prophylaxis: Heparin Code Status: No CPR, no intubation, can use BiPAP, administer ACLS meds, cardiovert if needed Family Communication: None at bedside Disposition Plan: Waiting for social worker to speak with the family regarding disposition.   Consultants:  Gastroenterology  IR  Palliative care Cardiology    Procedures:EGD 8/29 >>large duodenal ulcer with visible vessel, biopsy c/w chronic gastritis, focal inflammation  EGD 9/10 >> no bleeding LE Doppler 9/9 >>negative  ECHO:  Left ventricle: The cavity size was normal. Systolic function  was normal. The estimated ejection fraction was in the range of 60% to 65%. Wall motion was normal; there were no regional wall motion abnormalities. - Aortic valve: Transvalvular velocity was within the normal range. There was no stenosis. There was mild regurgitation. - Mitral valve: Mildly calcified annulus. Mildly thickened leaflets . Transvalvular velocity was within the normal range. There was no evidence for stenosis. There was no regurgitation. - Right ventricle: The cavity size was normal. Wall thickness was normal. Systolic function was normal. - Tricuspid valve: There was no regurgitation. - Pulmonary arteries: Systolic pressure was within the normal range. PA peak pressure: 30 mm Hg (S).   Antimicrobials: None  Subjective: Currently Denies any complaints Objective: Vitals:   09/23/18 0800 09/23/18 0948 09/23/18 1100 09/23/18 1200  BP:  (!) 183/81 117/65   Pulse:  85 69   Resp:   (!) 24   Temp: 97.9 F (36.6 C)   98.2 F (36.8 C)  TempSrc: Oral   Oral  SpO2:   93%   Weight:      Height:        Intake/Output Summary (Last 24 hours) at 09/23/2018 1241 Last data filed at 09/22/2018 1901 Gross per 24 hour  Intake 0 ml  Output -  Net 0 ml   Filed Weights   09/18/18 0442 09/19/18 0500 09/20/18 0437  Weight: (!) 152.1 kg (!) 144.3 kg (!) 142.6 kg    Examination:  General exam: Appears comfortable Respiratory system: Clear to auscultation. Respiratory effort normal. Cardiovascular system: S1 & S2 heard, RRR.  Gastrointestinal system: Abdomen is nondistended, soft and nontender. No organomegaly or masses felt. Normal bowel sounds heard. Central nervous system: Alert and oriented. No focal neurological deficits. Extremities: 2+ edema of the right upper extremity, legs are wrapped in Unna boots Skin: No rashes, lesions or ulcers  Psychiatry: Mood & affect appropriate.     Data Reviewed: I have personally reviewed following labs and imaging  studies  CBC: Recent Labs  Lab 09/19/18 1700 09/20/18 0035 09/21/18 1330 09/22/18 0233 09/23/18 0318  WBC 29.0* 23.1* 15.8* 20.8* 10.9*  HGB 8.9* 8.4* 10.5* 9.5* 9.4*  HCT 27.4* 26.4* 33.5* 30.7* 31.1*  MCV 89.0 90.7 92.3 92.2 93.7  PLT 137* 154 205 225 161   Basic Metabolic Panel: Recent Labs  Lab 09/19/18 0316 09/20/18 0035 09/21/18 1330 09/22/18 0233 09/23/18 0318  NA 140 142 145 144 144  K 4.4 4.0 3.2* 3.2* 3.7  CL 102 101 101 101 103  CO2 32 34* 36* 35* 37*  GLUCOSE 100* 116* 189* 114* 129*  BUN 35* 32* 28* 21 18  CREATININE 1.17 1.09 0.79 0.71 0.76  CALCIUM 7.8* 8.0* 8.4* 8.3* 8.4*   GFR: Estimated Creatinine Clearance: 111.5 mL/min (by C-G formula based on SCr of 0.76 mg/dL). Liver Function Tests: Recent Labs  Lab 09/16/18 1500  AST 28  ALT 33  ALKPHOS 191*  BILITOT 0.8  PROT 5.7*  ALBUMIN 1.9*   No results for input(s): LIPASE, AMYLASE in the last 168 hours. No results for input(s): AMMONIA in the last 168 hours. Coagulation Profile: No results for input(s): INR, PROTIME in the last 168 hours. Cardiac Enzymes: No results for input(s): CKTOTAL, CKMB, CKMBINDEX, TROPONINI  in the last 168 hours. BNP (last 3 results) No results for input(s): PROBNP in the last 8760 hours. HbA1C: No results for input(s): HGBA1C in the last 72 hours. CBG: Recent Labs  Lab 09/22/18 1159 09/22/18 1603 09/22/18 2108 09/23/18 0830 09/23/18 1205  GLUCAP 147* 147* 133* 127* 247*   Lipid Profile: No results for input(s): CHOL, HDL, LDLCALC, TRIG, CHOLHDL, LDLDIRECT in the last 72 hours. Thyroid Function Tests: No results for input(s): TSH, T4TOTAL, FREET4, T3FREE, THYROIDAB in the last 72 hours. Anemia Panel: No results for input(s): VITAMINB12, FOLATE, FERRITIN, TIBC, IRON, RETICCTPCT in the last 72 hours. Sepsis Labs: Recent Labs  Lab 09/16/18 1500  PROCALCITON 8.73    Recent Results (from the past 240 hour(s))  Culture, blood (Routine X 2) w Reflex to ID  Panel     Status: None   Collection Time: 09/14/18  3:50 PM  Result Value Ref Range Status   Specimen Description   Final    BLOOD RIGHT ANTECUBITAL Performed at [facility] [address], [place], [place] [address]    Special Requests   Final    BOTTLES DRAWN AEROBIC AND ANAEROBIC Blood Culture adequate volume Performed at [facility] [address] [place], [place] [address]    Culture   Final    NO GROWTH 5 DAYS Performed at [facility], [place] [address], [place], [place] [address]    Report Status 09/19/2018 FINAL  Final  Culture, blood (Routine X 2) w Reflex to ID Panel     Status: None   Collection Time: 09/14/18  3:51 PM  Result Value Ref Range Status   Specimen Description   Final    BLOOD RIGHT ANTECUBITAL Performed at [facility] [address], [place], [place] [address]    Special Requests   Final    BOTTLES DRAWN AEROBIC ONLY Blood Culture adequate volume Performed at [facility] [address] [place], [place] [address]    Culture   Final    NO GROWTH 5 DAYS Performed at [facility], [place] [address] [place], [place] [address]    Report Status 09/19/2018 FINAL  Final         Radiology Studies: No results found.      Scheduled Meds: . apixaban  5 mg Oral BID  . chlorhexidine  15 mL Mouth Rinse BID  . feeding supplement  1 Container Oral BID BM  . feeding supplement (PRO-STAT SUGAR FREE 64)  30 mL Oral BID  . hydrocortisone  10 mg Oral Daily  . insulin aspart  0-15 Units Subcutaneous TID WC  . insulin aspart  0-5 Units Subcutaneous QHS  . latanoprost  1 drop Both Eyes QHS  . mouth rinse  15 mL Mouth Rinse q12n4p  . metoprolol tartrate  12.5 mg Oral BID  . pantoprazole  40 mg Oral BID AC  . sucralfate  1 g Oral TID WC & HS   Continuous Infusions:   LOS: 35 days    Time spent: 35 minutes.     Hosie Poisson, MD [facility] Pager  [phone]   If 7PM-7AM, please contact night-coverage [email or web address] Password [email or web address] 09/23/2018, 12:41 PM

## 2018-09-23 NOTE — Progress Notes (Signed)
[employer] denied rehab stay due to patient current functional status. Patient is more appropriate for Long term care or Palliative services.  CSW will discuss disposition plan with the patient, family and medical team.  Physician has the option to complete to Peer to Peer. CSW will follow up with the physician.   Kathrin Greathouse, Marlinda Mike, MSW Clinical Social Worker  [phone] 09/23/2018  8:35 AM

## 2018-09-23 NOTE — Progress Notes (Signed)
CSW discussed patient disposition with daughter Ivin Booty by phone and the patient at bedside.   CSW explain patient denial for short rehab vs the recommendation for Long Term Care (LTC) as the patient is requiring assistance with all ADL's. The patient cannot safely discharge home alone.   Patient understands he will not be able to care for himself at home alone and his daughters cannot provide 24 hour care. Patient is now agreeable to LTC at [facility] facility.   CSW explain Long term care process/ medicaid process to patient daughter. The patient will need a medicaid application completed to determine if he qualifies for LTC medicaid.   Patient daughter plans to complete the application as soon as possible at [facility].  CSW will continue to assist with discharge planning.    Kathrin Greathouse, Marlinda Mike, MSW Clinical Social Worker  [phone] 09/23/2018  3:05 PM

## 2018-09-23 NOTE — Progress Notes (Signed)
Occupational Therapy Treatment Patient Details Name: [name] MRN: [id number] DOB: 10-18-1941 Today's Date: 09/23/2018    History of present illness Pt admitted with melena, GIB and hypotension.  Pt with hx of DM, Bil THR, chronic venous insufficiency, chronic osteomyelitis of lower leg, Pagets Disease, and R wrist fusion   OT comments  OT session focused on BUE positioning and AAROM   Follow Up Recommendations  SNF    Equipment Recommendations  Other (comment)(TBD at next venue of care)    Recommendations for Other Services      Precautions / Restrictions Precautions Precautions: Fall Precaution Comments: has NOT stood on his feet in 2 months Restrictions Weight Bearing Restrictions: No              ADL either performed or assessed with clinical judgement   ADL Overall ADL's : Needs assistance/impaired Eating/Feeding: Minimal assistance                                     General ADL Comments: OT session focused on BUE AAROm as well as positioning. OT placed BUE on pillows in order to position arms in a better position for pt to obtain his drink.       Vision Baseline Vision/History: Wears glasses            Cognition Arousal/Alertness: Awake/alert Behavior During Therapy: WFL for tasks assessed/performed Overall Cognitive Status: Within Functional Limits for tasks assessed                                 General Comments: very pleasant         Exercises Shoulder Exercises Shoulder Flexion: AAROM;Both;10 reps Shoulder Extension: AAROM;Both;10 reps Elbow Flexion: AAROM;Both;10 reps Elbow Extension: AAROM;Both;10 reps Wrist Flexion: Both;AROM;10 reps Wrist Extension: Both;AROM;10 reps Digit Composite Flexion: AROM;10 reps;Both Composite Extension: AROM;Both;10 reps   Shoulder Instructions            Pertinent Vitals/ Pain       Pain Assessment: No/denies pain     Prior Functioning/Environment              Frequency  Min 2X/week        Progress Toward Goals  OT Goals(current goals can now be found in the care plan section)  Progress towards OT goals: Progressing toward goals     Plan Discharge plan remains appropriate       AM-PAC PT "6 Clicks" Daily Activity     Outcome Measure   Help from another person eating meals?: None Help from another person taking care of personal grooming?: A Little Help from another person toileting, which includes using toliet, bedpan, or urinal?: Total Help from another person bathing (including washing, rinsing, drying)?: Total Help from another person to put on and taking off regular upper body clothing?: Total Help from another person to put on and taking off regular lower body clothing?: Total 6 Click Score: 11    End of Session Equipment Utilized During Treatment: Oxygen  OT Visit Diagnosis: Muscle weakness (generalized) (M62.81)   Activity Tolerance Patient tolerated treatment well   Patient Left in bed;with call bell/phone within reach   Nurse Communication Mobility status        Time: 7829-5621 OT Time Calculation (min): 10 min  Charges: OT Evaluation $OT Eval Low Complexity: 1 Low OT Treatments $Therapeutic Exercise: 8-22  mins  Kari Baars, [place] [facility] [phone] Office- [phone], Thereasa Parkin 09/23/2018, 3:43 PM

## 2018-09-24 LAB — CBC
HCT: 28.8 % — ABNORMAL LOW (ref 39.0–52.0)
Hemoglobin: 8.6 g/dL — ABNORMAL LOW (ref 13.0–17.0)
MCH: 28.4 pg (ref 26.0–34.0)
MCHC: 29.9 g/dL — AB (ref 30.0–36.0)
MCV: 95 fL (ref 78.0–100.0)
PLATELETS: 235 10*3/uL (ref 150–400)
RBC: 3.03 MIL/uL — ABNORMAL LOW (ref 4.22–5.81)
RDW: 15.1 % (ref 11.5–15.5)
WBC: 18.8 10*3/uL — AB (ref 4.0–10.5)

## 2018-09-24 LAB — BASIC METABOLIC PANEL
ANION GAP: 4 — AB (ref 5–15)
BUN: 16 mg/dL (ref 8–23)
CALCIUM: 8.2 mg/dL — AB (ref 8.9–10.3)
CO2: 36 mmol/L — ABNORMAL HIGH (ref 22–32)
CREATININE: 0.65 mg/dL (ref 0.61–1.24)
Chloride: 104 mmol/L (ref 98–111)
Glucose, Bld: 133 mg/dL — ABNORMAL HIGH (ref 70–99)
Potassium: 3.7 mmol/L (ref 3.5–5.1)
Sodium: 144 mmol/L (ref 135–145)

## 2018-09-24 LAB — GLUCOSE, CAPILLARY
GLUCOSE-CAPILLARY: 138 mg/dL — AB (ref 70–99)
GLUCOSE-CAPILLARY: 135 mg/dL — AB (ref 70–99)
Glucose-Capillary: 131 mg/dL — ABNORMAL HIGH (ref 70–99)
Glucose-Capillary: 111 mg/dL — ABNORMAL HIGH (ref 70–99)

## 2018-09-24 NOTE — Progress Notes (Signed)
Physical Therapy Treatment Patient Details Name: [name] MRN: [id number] DOB: 07-21-1941 Today's Date: 09/24/2018    History of Present Illness Pt admitted with melena, GIB and hypotension.  Pt with hx of DM, Bil THR, chronic venous insufficiency, chronic osteomyelitis of lower leg, Pagets Disease, and R wrist fusion    PT Comments    Pt with some progression of mobility today, agreeing to participate in attempted sit to stands from elevated surface. Pt unable to come to full standing due to lack of terminal hip extension. PT to continue to progress mobility as able.   HR remained <100 bpm during session, pt tired after session but stated attempted standing "felt good".    Follow Up Recommendations  SNF     Equipment Recommendations  None recommended by PT    Recommendations for Other Services       Precautions / Restrictions Precautions Precautions: Fall Restrictions Weight Bearing Restrictions: No    Mobility  Bed Mobility Overal bed mobility: Needs Assistance Bed Mobility: Rolling;Supine to Sit;Sit to Supine Rolling: Max assist;+2 for physical assistance;+2 for safety/equipment   Supine to sit: +2 for physical assistance;HOB elevated;Max assist Sit to supine: Max assist;+2 for physical assistance;+2 for safety/equipment   General bed mobility comments: rolling to R side to move supine>sit. Physical assist for rolling trunk and LEs. Supine<>sit max assist for LE mgmt and scooting to and from EOB. Pt with max assist with bed pad to assist.   Transfers Overall transfer level: Needs assistance Equipment used: Rolling walker (2 wheeled) Transfers: Sit to/from Stand Sit to Stand: From elevated surface;+2 physical assistance;+2 safety/equipment;Mod assist         General transfer comment: Pt sat EOB for 5 minutes prior to performing sit to stand attempts. Sit to stand attempt x2, pt lacking terminal hip extension to come to full standing. Assist for blocking LEs  L>R, power up, and steadying in standing. Pt with increased time and rest required.  Ambulation/Gait                 Stairs             Wheelchair Mobility    Modified Rankin (Stroke Patients Only)       Balance Overall balance assessment: Needs assistance Sitting-balance support: Bilateral upper extremity supported;Feet supported Sitting balance-Leahy Scale: Fair Sitting balance - Comments: Pt relying on UE support to balance in sitting   Standing balance support: Bilateral upper extremity supported Standing balance-Leahy Scale: Poor Standing balance comment: relies on RW and physical assist to stand                             Cognition Arousal/Alertness: Awake/alert Behavior During Therapy: [facility] for tasks assessed/performed Overall Cognitive Status: Within Functional Limits for tasks assessed                                        Exercises General Exercises - Lower Extremity Quad Sets: AROM;Both;5 reps;Supine    General Comments        Pertinent Vitals/Pain Pain Assessment: No/denies pain Pain Intervention(s): Limited activity within patient's tolerance;Monitored during session    Home Living                      Prior Function            PT  Goals (current goals can now be found in the care plan section) Acute Rehab PT Goals PT Goal Formulation: With patient Time For Goal Achievement: 10/08/18 Potential to Achieve Goals: Fair Progress towards PT goals: Progressing toward goals    Frequency    Min 2X/week      PT Plan Current plan remains appropriate    Co-evaluation              AM-PAC PT "6 Clicks" Daily Activity  Outcome Measure  Difficulty turning over in bed (including adjusting bedclothes, sheets and blankets)?: Unable Difficulty moving from lying on back to sitting on the side of the bed? : Unable Difficulty sitting down on and standing up from a chair with arms (e.g., wheelchair,  bedside commode, etc,.)?: Unable Help needed moving to and from a bed to chair (including a wheelchair)?: A Lot Help needed walking in hospital room?: Total Help needed climbing 3-5 steps with a railing? : Total 6 Click Score: 7    End of Session Equipment Utilized During Treatment: Oxygen;Gait belt Activity Tolerance: Patient tolerated treatment well;Patient limited by fatigue;Other (comment)(LE weakness) Patient left: in bed;with bed alarm set;with SCD's reapplied;with call bell/phone within reach Nurse Communication: Mobility status PT Visit Diagnosis: Muscle weakness (generalized) (M62.81)     Time: 1583-0940 PT Time Calculation (min) (ACUTE ONLY): 26 min  Charges:  $Therapeutic Activity: 23-37 mins                     Julien Girt, PT Acute Rehabilitation Services Pager [phone]  Office [phone]  [name] 09/24/2018, 2:02 PM

## 2018-09-24 NOTE — Progress Notes (Signed)
[place] for Apixaban Indication: pulmonary embolus and DVT  Allergies  Allergen Reactions  . Ace Inhibitors Cough    With Lisinopril.  . Angiotensin Receptor Blockers Other (See Comments)    Caused excessive weight gain  . Metformin Diarrhea    Patient Measurements: Height: 6' (182.9 cm) Weight: (!) 300 lb 14.9 oz (136.5 kg) IBW/kg (Calculated) : 77.6   Vital Signs: Temp: 98.7 F (37.1 C) (09/26 1107) Temp Source: Oral (09/26 1107) BP: 122/50 (09/26 1107) Pulse Rate: 70 (09/26 1107)  Labs: Recent Labs    09/22/18 0233 09/23/18 0318 09/24/18 0421  HGB 9.5* 9.4* 8.6*  HCT 30.7* 31.1* 28.8*  PLT 225 255 235  CREATININE 0.71 0.76 0.65    Estimated Creatinine Clearance: 108.9 mL/min (by C-G formula based on SCr of 0.65 mg/dL).   Assessment: Patient is a 77 year old male with a prolonged hospitalization, admitted on 8/21 for GI bleed, then complicated by a PE and UE DVT.  He was started on heparin drip and tolerated it, but after being converted to apixaban on 9/6 PM, patient developed GI bleeding requiring Andexxa reversal and embolization on 9/7.  Repeat EGD showed no new bleeding on 9/10.  Heparin drip was resumed on 9/16 for recent PE and new RUE DVT related to PICC line.  He tolerated the Heparin gtt for ~3.5 days.  Pharmacy then consulted to transition from Heparin back to Apixaban. Per discussion with MD, patient and MD are aware of risks.   Today, 09/24/2018:  CBC:  Hgb decreased to 8.6, Pltc remains WNL  SCr: 0.65, CrCl > 100 ml/min  No bleeding or complications reported per nursing   Goal of Therapy:  Therapeutic anticoagulation, prevention of recurrent VTE Absence of bleeding complications  Monitor platelets by anticoagulation protocol: Yes   Plan:   Continue Apixaban 5mg  PO BID   Monitor CBC daily  Monitor for s/sx of bleeding.    Lindell Spar, PharmD, BCPS Pager: [phone] 09/24/2018 12:17 PM

## 2018-09-24 NOTE — Progress Notes (Signed)
PROGRESS NOTE    DISHAWN Huff  ZOX:[id number] DOB: November 17, 1941 DOA: 08/19/2018 PCP: Guilford Bing, DO   Brief Narrative:  [name] is a 77 y/o with PMH of DM with neuropathy, chronic venous insufficiency, chronic osteomyelitis, chronic systolic and diastolic CHF (EF 81-19%), ischemic cardiomyopathy, HTN, Prostate CA who was being treated for c diff colitis as outpt and admitted for melena, hypotension, AKI with a Hb of 6.7 and a BUN/Cr of 111/2.1.  7/30-8/8>> He had a hospitalization for sepsis (urine/ pneumonia/ soft tissue) & PEA cardiac arrest suspected to be from a CHF exacerbation with anew drop in EF to 35%.    After admission on 8/21, he was stabilized with blood transfusions and his bleeding resolved. He was going to be discharged on 8/24 but then had a large BM with drop in Hb. EGD 8/29 > large duodenal bulb ulcer with the large visible vessel and evidence of recent bleeding- treated with epinephrine injection and gold probe cautery. Patient had oozing of blood after gold probe cautery and subsequently Hemo spray was used with achievement of hemostasis. 8/30 - RUE PICC placed 9/4 he was found to have a PE and started on Heparin infusion. LE venous duplex negative on 9/9 9/6 palliative care consulted 9/7 developed hematemesis, hypotension and transferred to ICU for pressors- anticoagulation reversed and transfused PRBC- embolized by IR 9/9 - pressors stopped- transferred out of ICU   9/10 repeat EGD show no further bleeding 9/13 transferred back to SDU for acute resp failure related to fluid overloaded-   9/16 found to have RUE DVT related to PICC- Heparin resumed without bolus. 9/17 cardiology consulted for new systolic CHF evaluation, noted to have abnormal EKG (anterolateral T wave changes), Troponin 0.06- determined to not be a candidate for further intervention due to inability to adequately anticoagulate if stenting needed. Cardiology signed off. 9/18 Levophed  tapered off  9/19 transferred to [facility] 9/26  SW Assisting for placement at [facility] AT SNF.   Assessment & Plan:   Principal Problem:   Systolic CHF, acute on chronic [facility]) Active Problems:   Morbid obesity with body mass index of 50.0-59.9 in adult [facility])   Essential hypertension   Encounter for central line placement   Impaired mobility   Fluid overload   Chronic venous insufficiency, Bilateral Legs   SOB (shortness of breath)   Gastrointestinal hemorrhage   Pressure injury of skin   Hemorrhagic shock (HCC)   Malnutrition of moderate degree   Anasarca   Acute pulmonary embolism (HCC)   Acute respiratory failure (HCC)   Duodenal ulcer   Abdominal pain   Leukocytosis   Elevated alkaline phosphatase measurement   Hypovolemic shock:  Secondary to GI bleed from duodenal ulcer. Patient had upper endoscopy on September 10.  And his Eliquis was restarted.  His hemoglobin appears to be stable between 8-9 range. No changes in meds.     Acute on chronic hypercarbic respiratory failure/acute pulmonary embolism Appears to have resolved.  Resume CPAP at night. Pt is non compliant to CPAP at night.    Acute kidney injury patient had creatinine of 2.1 on August 21.  Creatinine has been stable between 0.7-1.1. Creatinine continues to remain stable.    Morbid obesity    Hypertension well-controlled    Adrenal insufficiency currently on Cortef   Chronic venous insufficiency Unna boots    Nutrition of moderate degree continue Protostat and boost.  DVT prophylaxis: Heparin Code Status: No CPR, no intubation, can use  BiPAP, administer ACLS meds, cardiovert if needed Family Communication: None at bedside Disposition Plan: Waiting for social worker to speak with the family regarding disposition.   Consultants:   Gastroenterology  IR  Palliative care Cardiology    Procedures:EGD 8/29 >>large duodenal ulcer with visible vessel, biopsy c/w chronic  gastritis, focal inflammation  EGD 9/10 >> no bleeding LE Doppler 9/9 >>negative  ECHO:  Left ventricle: The cavity size was normal. Systolic function was normal. The estimated ejection fraction was in the range of 60% to 65%. Wall motion was normal; there were no regional wall motion abnormalities. - Aortic valve: Transvalvular velocity was within the normal range. There was no stenosis. There was mild regurgitation. - Mitral valve: Mildly calcified annulus. Mildly thickened leaflets . Transvalvular velocity was within the normal range. There was no evidence for stenosis. There was no regurgitation. - Right ventricle: The cavity size was normal. Wall thickness was normal. Systolic function was normal. - Tricuspid valve: There was no regurgitation. - Pulmonary arteries: Systolic pressure was within the normal range. PA peak pressure: 30 mm Hg (S).   Antimicrobials: None  Subjective:  no new complaints.  Understands that he is unable to go home and take care of himself, agreeable to long term facility placement.   Objective: Vitals:   09/23/18 2132 09/24/18 0108 09/24/18 0523 09/24/18 1107  BP: (!) 145/59 (!) 158/77 (!) 121/53 (!) 122/50  Pulse: 78 79 (!) 57 70  Resp: 20 18 16 16   Temp: 98.6 F (37 C) 98.7 F (37.1 C) 98.6 F (37 C) 98.7 F (37.1 C)  TempSrc: Oral Oral Oral Oral  SpO2: 99% 100% 100% 95%  Weight:   (!) 136.5 kg   Height:        Intake/Output Summary (Last 24 hours) at 09/24/2018 1317 Last data filed at 09/24/2018 1100 Gross per 24 hour  Intake 360 ml  Output 1650 ml  Net -1290 ml   Filed Weights   09/19/18 0500 09/20/18 0437 09/24/18 0523  Weight: (!) 144.3 kg (!) 142.6 kg (!) 136.5 kg    Examination:  General exam: sob at rest on [place] oxygen.  Respiratory system: diminished at bases, no wheezing or rhonchi.  Cardiovascular system: S1 & S2 heard, RRR.  Gastrointestinal system: Abdomen is soft non tender non distended bowel sounds  good. Central nervous system: Alert and oriented.  Extremities: 2+ edema of the right upper extremity, legs are wrapped in Unna boots Skin: No rashes, lesions or ulcers  Psychiatry: Mood & affect appropriate.     Data Reviewed: I have personally reviewed following labs and imaging studies  CBC: Recent Labs  Lab 09/20/18 0035 09/21/18 1330 09/22/18 0233 09/23/18 0318 09/24/18 0421  WBC 23.1* 15.8* 20.8* 10.9* 18.8*  HGB 8.4* 10.5* 9.5* 9.4* 8.6*  HCT 26.4* 33.5* 30.7* 31.1* 28.8*  MCV 90.7 92.3 92.2 93.7 95.0  PLT 154 205 225 255 009   Basic Metabolic Panel: Recent Labs  Lab 09/20/18 0035 09/21/18 1330 09/22/18 0233 09/23/18 0318 09/24/18 0421  NA 142 145 144 144 144  K 4.0 3.2* 3.2* 3.7 3.7  CL 101 101 101 103 104  CO2 34* 36* 35* 37* 36*  GLUCOSE 116* 189* 114* 129* 133*  BUN 32* 28* 21 18 16   CREATININE 1.09 0.79 0.71 0.76 0.65  CALCIUM 8.0* 8.4* 8.3* 8.4* 8.2*   GFR: Estimated Creatinine Clearance: 108.9 mL/min (by C-G formula based on SCr of 0.65 mg/dL). Liver Function Tests: No results for input(s): AST, ALT, ALKPHOS,  BILITOT, PROT, ALBUMIN in the last 168 hours. No results for input(s): LIPASE, AMYLASE in the last 168 hours. No results for input(s): AMMONIA in the last 168 hours. Coagulation Profile: No results for input(s): INR, PROTIME in the last 168 hours. Cardiac Enzymes: No results for input(s): CKTOTAL, CKMB, CKMBINDEX, TROPONINI in the last 168 hours. BNP (last 3 results) No results for input(s): PROBNP in the last 8760 hours. HbA1C: No results for input(s): HGBA1C in the last 72 hours. CBG: Recent Labs  Lab 09/23/18 1205 09/23/18 1639 09/23/18 2128 09/24/18 0758 09/24/18 1218  GLUCAP 247* 110* 153* 131* 138*   Lipid Profile: No results for input(s): CHOL, HDL, LDLCALC, TRIG, CHOLHDL, LDLDIRECT in the last 72 hours. Thyroid Function Tests: No results for input(s): TSH, T4TOTAL, FREET4, T3FREE, THYROIDAB in the last 72 hours. Anemia  Panel: No results for input(s): VITAMINB12, FOLATE, FERRITIN, TIBC, IRON, RETICCTPCT in the last 72 hours. Sepsis Labs: No results for input(s): PROCALCITON, LATICACIDVEN in the last 168 hours.  Recent Results (from the past 240 hour(s))  Culture, blood (Routine X 2) w Reflex to ID Panel     Status: None   Collection Time: 09/14/18  3:50 PM  Result Value Ref Range Status   Specimen Description   Final    BLOOD RIGHT ANTECUBITAL Performed at [facility] [facility] [address], [place], [place] [address]    Special Requests   Final    BOTTLES DRAWN AEROBIC AND ANAEROBIC Blood Culture adequate volume Performed at [place] [address], [place], [place] [address]    Culture   Final    NO GROWTH 5 DAYS Performed at [facility], [place] [address], [place], [place] [address]    Report Status 09/19/2018 FINAL  Final  Culture, blood (Routine X 2) w Reflex to ID Panel     Status: None   Collection Time: 09/14/18  3:51 PM  Result Value Ref Range Status   Specimen Description   Final    BLOOD RIGHT ANTECUBITAL Performed at [place] [address] [place], [place] [address]    Special Requests   Final    BOTTLES DRAWN AEROBIC ONLY Blood Culture adequate volume Performed at [facility] [address] [place], [place] [address]    Culture   Final    NO GROWTH 5 DAYS Performed at [facility], [place] [address] [place], [place] [address]    Report Status 09/19/2018 FINAL  Final         Radiology Studies: No results found.      Scheduled Meds: . apixaban  5 mg Oral BID  . chlorhexidine  15 mL Mouth Rinse BID  . feeding supplement  1 Container Oral BID BM  . feeding supplement (PRO-STAT SUGAR FREE 64)  30 mL Oral BID  . hydrocortisone  10 mg Oral Daily  . insulin aspart  0-15 Units Subcutaneous TID WC  . insulin aspart  0-5 Units Subcutaneous QHS  . latanoprost  1 drop Both Eyes QHS    . mouth rinse  15 mL Mouth Rinse q12n4p  . metoprolol tartrate  12.5 mg Oral BID  . pantoprazole  40 mg Oral BID AC  . sucralfate  1 g Oral TID WC & HS   Continuous Infusions:   LOS: 36 days    Time spent: 2minutes.     Hosie Poisson, MD [facility] Pager [phone]   If 7PM-7AM, please contact night-coverage [email or web address]  Password [email or web address] 09/24/2018, 1:17 PM

## 2018-09-24 NOTE — Progress Notes (Signed)
Nutrition Follow-up  DOCUMENTATION CODES:   Non-severe (moderate) malnutrition in context of chronic illness, Obesity unspecified  INTERVENTION:   - Continue Boost Breeze BID, Magic Cup BID, and Prostat BID. - Continue to encourage PO intakes.   NUTRITION DIAGNOSIS:   Moderate Malnutrition related to chronic illness(chronic osteomyelitis) as evidenced by mild fat depletion, moderate muscle depletion.  Ongoiong  GOAL:   Patient will meet greater than or equal to 90% of their needs  Progressing  MONITOR:   PO intake, Supplement acceptance, Weight trends, Labs, Skin  REASON FOR ASSESSMENT:   Consult Assessment of nutrition requirement/status  ASSESSMENT:   77 yo male who was admitted due to melena and hypotension, hypovolemic shock d/t acute blood loss anemia complicated by AKI. He has PMH significant for He chronic osteomyelitis, CHF, type 2 DM, GERD, hiatal hernia, and HTN.  He was currently treated for C. difficile colitis, and noticed at home melanotic stools. Recent hospitalization for sepsis and cardiac arrest 07/31/18.   Intake remains inconsistent. Meal completions charted as 60-100% for the pt's last 8 meals. Pt continues to take Prostat BID and drink 1-2 Boost Breeze daily. He denies any issues with swallowing, nausea, or vomiting. Will continue with current interventions.   Weight noted to decrease from 335 lb on 9/20 to 300 lb today. Weight has fluctuated since admit likely related to fluid status. Will continue to monitor trends.   Plan for pt to discharge to LTC at [facility] facility.   Medications reviewed and include: carafate Labs reviewed.   Diet Order:   Diet Order            DIET SOFT Room service appropriate? Yes; Fluid consistency: Thin  Diet effective now        Diet - low sodium heart healthy              EDUCATION NEEDS:   No education needs have been identified at this time  Skin:  Skin Assessment: Skin Integrity Issues: Skin Integrity  Issues:: Unstageable Stage II: L heel, L thigh, coccyx, sacrum Unstageable: full thickness to R toe  Last BM:  09/23/18  Height:   Ht Readings from Last 1 Encounters:  09/10/18 6' (1.829 m)    Weight:   Wt Readings from Last 1 Encounters:  09/24/18 (!) 136.5 kg    Ideal Body Weight:  80.91 kg  BMI:  Body mass index is 40.81 kg/m.  Estimated Nutritional Needs:   Kcal:  1995-2260 (15-17 kcal/kg)  Protein:  120-130 grams  Fluid:  >/= 2 L/day  Mariana Single RD, LDN Clinical Nutrition Pager # - [phone]

## 2018-09-25 ENCOUNTER — Inpatient Hospital Stay (HOSPITAL_COMMUNITY): Payer: PPO

## 2018-09-25 LAB — GLUCOSE, CAPILLARY
GLUCOSE-CAPILLARY: 150 mg/dL — AB (ref 70–99)
GLUCOSE-CAPILLARY: 100 mg/dL — AB (ref 70–99)
Glucose-Capillary: 95 mg/dL (ref 70–99)
Glucose-Capillary: 134 mg/dL — ABNORMAL HIGH (ref 70–99)

## 2018-09-25 LAB — CBC
HCT: 27.7 % — ABNORMAL LOW (ref 39.0–52.0)
Hemoglobin: 8.3 g/dL — ABNORMAL LOW (ref 13.0–17.0)
MCH: 29 pg (ref 26.0–34.0)
MCHC: 30 g/dL (ref 30.0–36.0)
MCV: 96.9 fL (ref 78.0–100.0)
Platelets: 231 10*3/uL (ref 150–400)
RBC: 2.86 MIL/uL — AB (ref 4.22–5.81)
RDW: 15.1 % (ref 11.5–15.5)
WBC: 17.9 10*3/uL — ABNORMAL HIGH (ref 4.0–10.5)

## 2018-09-25 LAB — URINALYSIS, ROUTINE W REFLEX MICROSCOPIC
BILIRUBIN URINE: NEGATIVE
Glucose, UA: NEGATIVE mg/dL
KETONES UR: NEGATIVE mg/dL
Nitrite: NEGATIVE
Protein, ur: 30 mg/dL — AB
Specific Gravity, Urine: 1.015 (ref 1.005–1.030)
pH: 7 (ref 5.0–8.0)

## 2018-09-25 LAB — LACTIC ACID, PLASMA: Lactic Acid, Venous: 2.2 mmol/L (ref 0.5–1.9)

## 2018-09-25 MED ORDER — SODIUM CHLORIDE 0.9 % IV BOLUS
1000.0000 mL | Freq: Once | INTRAVENOUS | Status: AC
  Administered 2018-09-25: 1000 mL via INTRAVENOUS

## 2018-09-25 MED ORDER — SODIUM CHLORIDE 0.9 % IV BOLUS
500.0000 mL | Freq: Once | INTRAVENOUS | Status: AC
  Administered 2018-09-25: 500 mL via INTRAVENOUS

## 2018-09-25 MED ORDER — ACETAMINOPHEN 325 MG PO TABS
650.0000 mg | ORAL_TABLET | ORAL | Status: DC | PRN
  Administered 2018-09-25 – 2018-09-26 (×2): 650 mg via ORAL
  Filled 2018-09-25 (×2): qty 2

## 2018-09-25 NOTE — Progress Notes (Signed)
Occupational Therapy Treatment Patient Details Name: [name] MRN: [id number] DOB: 1941-02-10 Today's Date: 09/25/2018    History of present illness Pt admitted with melena, GIB and hypotension.  Pt with hx of DM, Bil THR, chronic venous insufficiency, chronic osteomyelitis of lower leg, Pagets Disease, and R wrist fusion   OT comments  Pt agreed to BUE exercise.  Son present  Follow Up Recommendations  SNF    Equipment Recommendations  Other (comment)(TBD at next venue of care)    Recommendations for Other Services      Precautions / Restrictions Precautions Precautions: Fall Restrictions Weight Bearing Restrictions: No              ADL either performed or assessed with clinical judgement   ADL Overall ADL's : Needs assistance/impaired Eating/Feeding: Minimal assistance   Grooming: Bed level;Minimal assistance                                                 Cognition Arousal/Alertness: Awake/alert Behavior During Therapy: WFL for tasks assessed/performed Overall Cognitive Status: Within Functional Limits for tasks assessed                                          Exercises Shoulder Exercises Shoulder Flexion: AAROM;Both;10 reps Shoulder Extension: AAROM;Both;10 reps Elbow Flexion: AAROM;Both;10 reps Elbow Extension: AAROM;Both;10 reps Wrist Flexion: Both;AROM;10 reps Wrist Extension: Both;AROM;10 reps Digit Composite Flexion: AROM;10 reps;Both Composite Extension: AROM;Both;10 reps        Frequency  Min 2X/week        Progress Toward Goals  OT Goals(current goals can now be found in the care plan section)        Plan Discharge plan remains appropriate       AM-PAC PT "6 Clicks" Daily Activity     Outcome Measure   Help from another person eating meals?: None Help from another person taking care of personal grooming?: A Little Help from another person toileting, which includes using toliet,  bedpan, or urinal?: Total Help from another person bathing (including washing, rinsing, drying)?: Total Help from another person to put on and taking off regular upper body clothing?: Total Help from another person to put on and taking off regular lower body clothing?: Total 6 Click Score: 11    End of Session Equipment Utilized During Treatment: Oxygen  OT Visit Diagnosis: Muscle weakness (generalized) (M62.81)   Activity Tolerance Patient tolerated treatment well   Patient Left in bed;with call bell/phone within reach   Nurse Communication Mobility status        Time: 1191-4782 OT Time Calculation (min): 15 min  Charges: OT General Charges $OT Visit: 1 Visit OT Treatments $Therapeutic Exercise: 8-22 mins  Kari Baars, OT Acute Rehabilitation Services [phone] Office- [phone]      Lorine Iannaccone, Edwena Felty D 09/25/2018, 2:32 PM

## 2018-09-25 NOTE — Progress Notes (Signed)
   09/25/18 1424  Clinical Encounter Type  Visited With Patient;Other (Comment) (Friends)  Visit Type Initial  Referral From Palliative care team  Consult/Referral To Chaplain;Other (Comment) (Chaplain introduced herself to Pt. )  Chaplain rounding on floor.  Chaplain entered Pt. room with Pt. guests.  Pt. nor guests would identify themselves as Pt. friends or family. Pt. at time of chaplain visit was having blood drawn. Chaplain acknowledged the presence of Pt. guests and communicated willingness to come back later.  Chaplain followed up with Pt. Nurse as chaplain left Pt. Room.

## 2018-09-25 NOTE — Progress Notes (Signed)
CRITICAL VALUE ALERT  Critical Value:  Lactic acid 2.2  Date & Time Notied:  09/25/18   1523  Provider Notified: Dr Karleen Hampshire via page

## 2018-09-25 NOTE — Progress Notes (Signed)
CSW spoke with patient's daughter regarding Medicaid application. Daughter plans to complete application on Monday or Tuesday.   CSW continuing to follow for discharge coordination.     Pricilla Holm, MSW, [place] [facility] [phone]

## 2018-09-25 NOTE — Progress Notes (Signed)
Per RN, Pt. agreed to wear CPAP, placed on w/o difficulty, tolerating well.

## 2018-09-25 NOTE — [place] Notes (Signed)
PROGRESS NOTE    [name]  HER:[id number] DOB: Mar 25, 1941 DOA: 08/19/2018 PCP: Cut Off Bing, DO   Brief Narrative:  [name] is a 77 y/o with PMH of DM with neuropathy, chronic venous insufficiency, chronic osteomyelitis, chronic systolic and diastolic CHF (EF 85-63%), ischemic cardiomyopathy, HTN, Prostate CA who was being treated for c diff colitis as outpt and admitted for melena, hypotension, AKI with a Hb of 6.7 and a BUN/Cr of 111/2.1.  7/30-8/8>> He had a hospitalization for sepsis (urine/ pneumonia/ soft tissue) & PEA cardiac arrest suspected to be from a CHF exacerbation with anew drop in EF to 35%.    After admission on 8/21, he was stabilized with blood transfusions and his bleeding resolved. He was going to be discharged on 8/24 but then had a large BM with drop in Hb. EGD 8/29 > large duodenal bulb ulcer with the large visible vessel and evidence of recent bleeding- treated with epinephrine injection and gold probe cautery. Patient had oozing of blood after gold probe cautery and subsequently Hemo spray was used with achievement of hemostasis. 8/30 - RUE PICC placed 9/4 he was found to have a PE and started on Heparin infusion. LE venous duplex negative on 9/9 9/6 palliative care consulted 9/7 developed hematemesis, hypotension and transferred to ICU for pressors- anticoagulation reversed and transfused PRBC- embolized by IR 9/9 - pressors stopped- transferred out of ICU   9/10 repeat EGD show no further bleeding 9/13 transferred back to SDU for acute resp failure related to fluid overloaded-   9/16 found to have RUE DVT related to PICC- Heparin resumed without bolus. 9/17 cardiology consulted for new systolic CHF evaluation, noted to have abnormal EKG (anterolateral T wave changes), Troponin 0.06- determined to not be a candidate for further intervention due to inability to adequately anticoagulate if stenting needed. Cardiology signed off. 9/18 Levophed  tapered off  9/19 transferred to [facility] 9/26  SW Assisting for placement at [facility] AT SNF.   Assessment & Plan:   Principal Problem:   Systolic CHF, acute on chronic [facility]) Active Problems:   Morbid obesity with body mass index of 50.0-59.9 in adult [facility])   Essential hypertension   Encounter for central line placement   Impaired mobility   Fluid overload   Chronic venous insufficiency, Bilateral Legs   SOB (shortness of breath)   Gastrointestinal hemorrhage   Pressure injury of skin   Hemorrhagic shock (HCC)   Malnutrition of moderate degree   Anasarca   Acute pulmonary embolism (HCC)   Acute respiratory failure (HCC)   Duodenal ulcer   Abdominal pain   Leukocytosis   Elevated alkaline phosphatase measurement   Hypovolemic shock:  Secondary to GI bleed from duodenal ulcer. Patient had upper endoscopy on September 10.  And his Eliquis was restarted.  His hemoglobin appears to be stable between 8-9 range. Repeat H&H is around 8.3. No signs of bleeding.  No changes in meds. Resume BID PPI.    Acute on chronic hypercarbic respiratory failure/acute pulmonary embolism Appears to have resolved.  Resume CPAP at night. Pt is non compliant to CPAP at night.    Acute kidney injury patient had creatinine of 2.1 on August 21.  Creatinine has been stable between 0.7-1.1. Creatinine continues to remain stable.    Morbid obesity  Hypertension mildly hypotensive earlier this am. 500 ml bolus given , repeat Bp normalized.     Adrenal insufficiency currently on Cortef   Chronic venous insufficiency Unna boots.  Wound care recommendations .    Nutrition of moderate degree continue Protostat and boost.  DVT prophylaxis: Heparin Code Status: No CPR, no intubation, can use BiPAP, administer ACLS meds, cardiovert if needed Family Communication: None at bedside Disposition Plan: awaiting disposition.    Consultants:   Gastroenterology  IR  Palliative  care Cardiology    Procedures:EGD 8/29 >>large duodenal ulcer with visible vessel, biopsy c/w chronic gastritis, focal inflammation  EGD 9/10 >> no bleeding LE Doppler 9/9 >>negative  ECHO:  Left ventricle: The cavity size was normal. Systolic function was normal. The estimated ejection fraction was in the range of 60% to 65%. Wall motion was normal; there were no regional wall motion abnormalities. - Aortic valve: Transvalvular velocity was within the normal range. There was no stenosis. There was mild regurgitation. - Mitral valve: Mildly calcified annulus. Mildly thickened leaflets . Transvalvular velocity was within the normal range. There was no evidence for stenosis. There was no regurgitation. - Right ventricle: The cavity size was normal. Wall thickness was normal. Systolic function was normal. - Tricuspid valve: There was no regurgitation. - Pulmonary arteries: Systolic pressure was within the normal range. PA peak pressure: 30 mm Hg (S).   Antimicrobials: None  Subjective:  no complaints used CPAP at night.   Objective: Vitals:   09/25/18 0058 09/25/18 0542 09/25/18 0801 09/25/18 1021  BP: (!) 105/56 (!) 94/41 (!) 94/46 134/63  Pulse: 66 (!) 54 64 94  Resp: 16 18    Temp: 98.6 F (37 C) 98 F (36.7 C)  98.4 F (36.9 C)  TempSrc: Oral Oral  Oral  SpO2: 99% 100%    Weight:  135.9 kg    Height:        Intake/Output Summary (Last 24 hours) at 09/25/2018 1249 Last data filed at 09/25/2018 0908 Gross per 24 hour  Intake 502.31 ml  Output 350 ml  Net 152.31 ml   Filed Weights   09/20/18 0437 09/24/18 0523 09/25/18 0542  Weight: (!) 142.6 kg (!) 136.5 kg 135.9 kg    Examination:  General exam: not in distress, using oxygen.  Respiratory system: diminished at bases, no wheezing or rhonchi.  Cardiovascular system: S1 & S2 heard, RRR. No JVD.  Gastrointestinal system: Abdomen is soft NT ND BS+ Central nervous system: Alert and oriented.   Extremities: 2+ edema of the right upper extremity, legs are wrapped in Unna boots Skin: No rashes, lesions or ulcers  Psychiatry: Mood & affect appropriate.     Data Reviewed: I have personally reviewed following labs and imaging studies  CBC: Recent Labs  Lab 09/21/18 1330 09/22/18 0233 09/23/18 0318 09/24/18 0421 09/25/18 0746  WBC 15.8* 20.8* 10.9* 18.8* 17.9*  HGB 10.5* 9.5* 9.4* 8.6* 8.3*  HCT 33.5* 30.7* 31.1* 28.8* 27.7*  MCV 92.3 92.2 93.7 95.0 96.9  PLT 205 225 255 235 510   Basic Metabolic Panel: Recent Labs  Lab 09/20/18 0035 09/21/18 1330 09/22/18 0233 09/23/18 0318 09/24/18 0421  NA 142 145 144 144 144  K 4.0 3.2* 3.2* 3.7 3.7  CL 101 101 101 103 104  CO2 34* 36* 35* 37* 36*  GLUCOSE 116* 189* 114* 129* 133*  BUN 32* 28* 21 18 16   CREATININE 1.09 0.79 0.71 0.76 0.65  CALCIUM 8.0* 8.4* 8.3* 8.4* 8.2*   GFR: Estimated Creatinine Clearance: 108.6 mL/min (by C-G formula based on SCr of 0.65 mg/dL). Liver Function Tests: No results for input(s): AST, ALT, ALKPHOS, BILITOT, PROT, ALBUMIN in the last 168  hours. No results for input(s): LIPASE, AMYLASE in the last 168 hours. No results for input(s): AMMONIA in the last 168 hours. Coagulation Profile: No results for input(s): INR, PROTIME in the last 168 hours. Cardiac Enzymes: No results for input(s): CKTOTAL, CKMB, CKMBINDEX, TROPONINI in the last 168 hours. BNP (last 3 results) No results for input(s): PROBNP in the last 8760 hours. HbA1C: No results for input(s): HGBA1C in the last 72 hours. CBG: Recent Labs  Lab 09/24/18 1218 09/24/18 1648 09/24/18 2115 09/25/18 0730 09/25/18 1145  GLUCAP 138* 135* 111* 95 134*   Lipid Profile: No results for input(s): CHOL, HDL, LDLCALC, TRIG, CHOLHDL, LDLDIRECT in the last 72 hours. Thyroid Function Tests: No results for input(s): TSH, T4TOTAL, FREET4, T3FREE, THYROIDAB in the last 72 hours. Anemia Panel: No results for input(s): VITAMINB12, FOLATE,  FERRITIN, TIBC, IRON, RETICCTPCT in the last 72 hours. Sepsis Labs: No results for input(s): PROCALCITON, LATICACIDVEN in the last 168 hours.  No results found for this or any previous visit (from the past 240 hour(s)).       Radiology Studies: No results found.      Scheduled Meds: . apixaban  5 mg Oral BID  . chlorhexidine  15 mL Mouth Rinse BID  . feeding supplement  1 Container Oral BID BM  . feeding supplement (PRO-STAT SUGAR FREE 64)  30 mL Oral BID  . hydrocortisone  10 mg Oral Daily  . insulin aspart  0-15 Units Subcutaneous TID WC  . insulin aspart  0-5 Units Subcutaneous QHS  . latanoprost  1 drop Both Eyes QHS  . mouth rinse  15 mL Mouth Rinse q12n4p  . metoprolol tartrate  12.5 mg Oral BID  . pantoprazole  40 mg Oral BID AC  . sucralfate  1 g Oral TID WC & HS   Continuous Infusions:   LOS: 37 days    Time spent: 78minutes.     Hosie Poisson, MD [facility] Pager [phone]   If 7PM-7AM, please contact night-coverage [email or web address] Password [facility] 09/25/2018, 12:49 PM

## 2018-09-26 LAB — CBC
HCT: 27.1 % — ABNORMAL LOW (ref 39.0–52.0)
Hemoglobin: 8.1 g/dL — ABNORMAL LOW (ref 13.0–17.0)
MCH: 28.8 pg (ref 26.0–34.0)
MCHC: 29.9 g/dL — ABNORMAL LOW (ref 30.0–36.0)
MCV: 96.4 fL (ref 78.0–100.0)
PLATELETS: 218 10*3/uL (ref 150–400)
RBC: 2.81 MIL/uL — AB (ref 4.22–5.81)
RDW: 14.9 % (ref 11.5–15.5)
WBC: 22.2 10*3/uL — ABNORMAL HIGH (ref 4.0–10.5)

## 2018-09-26 LAB — BLOOD CULTURE ID PANEL (REFLEXED)
Acinetobacter baumannii: NOT DETECTED
CANDIDA PARAPSILOSIS: NOT DETECTED
CARBAPENEM RESISTANCE: NOT DETECTED
Candida albicans: NOT DETECTED
Candida glabrata: NOT DETECTED
Candida krusei: NOT DETECTED
Candida tropicalis: NOT DETECTED
ENTEROCOCCUS SPECIES: NOT DETECTED
Enterobacter cloacae complex: NOT DETECTED
Enterobacteriaceae species: DETECTED — AB
Escherichia coli: DETECTED — AB
HAEMOPHILUS INFLUENZAE: NOT DETECTED
KLEBSIELLA PNEUMONIAE: NOT DETECTED
Klebsiella oxytoca: NOT DETECTED
LISTERIA MONOCYTOGENES: NOT DETECTED
Neisseria meningitidis: NOT DETECTED
PSEUDOMONAS AERUGINOSA: NOT DETECTED
Proteus species: NOT DETECTED
STAPHYLOCOCCUS AUREUS BCID: NOT DETECTED
STREPTOCOCCUS PYOGENES: NOT DETECTED
STREPTOCOCCUS SPECIES: NOT DETECTED
Serratia marcescens: NOT DETECTED
Staphylococcus species: NOT DETECTED
Streptococcus agalactiae: NOT DETECTED
Streptococcus pneumoniae: NOT DETECTED

## 2018-09-26 LAB — GLUCOSE, CAPILLARY
GLUCOSE-CAPILLARY: 78 mg/dL (ref 70–99)
GLUCOSE-CAPILLARY: 117 mg/dL — AB (ref 70–99)
Glucose-Capillary: 104 mg/dL — ABNORMAL HIGH (ref 70–99)
Glucose-Capillary: 175 mg/dL — ABNORMAL HIGH (ref 70–99)
Glucose-Capillary: 54 mg/dL — ABNORMAL LOW (ref 70–99)

## 2018-09-26 LAB — BASIC METABOLIC PANEL
Anion gap: 4 — ABNORMAL LOW (ref 5–15)
BUN: 26 mg/dL — AB (ref 8–23)
CO2: 35 mmol/L — ABNORMAL HIGH (ref 22–32)
Calcium: 7.7 mg/dL — ABNORMAL LOW (ref 8.9–10.3)
Chloride: 106 mmol/L (ref 98–111)
Creatinine, Ser: 1.03 mg/dL (ref 0.61–1.24)
GFR calc Af Amer: >60 mL/min (ref >60)
GLUCOSE: 119 mg/dL — AB (ref 70–99)
POTASSIUM: 3.4 mmol/L — AB (ref 3.5–5.1)
Sodium: 145 mmol/L (ref 135–145)

## 2018-09-26 LAB — URINE CULTURE

## 2018-09-26 MED ORDER — FUROSEMIDE 20 MG PO TABS
20.0000 mg | ORAL_TABLET | Freq: Every day | ORAL | Status: DC
  Administered 2018-09-26 – 2018-09-29 (×4): 20 mg via ORAL
  Filled 2018-09-26 (×4): qty 1

## 2018-09-26 MED ORDER — SODIUM CHLORIDE 0.9 % IV SOLN
2.0000 g | Freq: Every day | INTRAVENOUS | Status: DC
  Administered 2018-09-26 – 2018-10-01 (×6): 2 g via INTRAVENOUS
  Filled 2018-09-26: qty 20
  Filled 2018-09-26 (×5): qty 2

## 2018-09-26 MED ORDER — SODIUM CHLORIDE 0.9 % IV SOLN
INTRAVENOUS | Status: DC | PRN
  Administered 2018-10-01: 250 mL via INTRAVENOUS

## 2018-09-26 NOTE — [place] Notes (Signed)
PROGRESS NOTE    [name]  OAC:[id number] DOB: Jun 11, 1941 DOA: 08/19/2018 PCP: Sartell Bing, DO   Brief Narrative:  [name] is a 77 y/o with PMH of DM with neuropathy, chronic venous insufficiency, chronic osteomyelitis, chronic systolic and diastolic CHF (EF 01-09%), ischemic cardiomyopathy, HTN, Prostate CA who was being treated for c diff colitis as outpt and admitted for melena, hypotension, AKI with a Hb of 6.7 and a BUN/Cr of 111/2.1.  7/30-8/8>> He had a hospitalization for sepsis (urine/ pneumonia/ soft tissue) & PEA cardiac arrest suspected to be from a CHF exacerbation with anew drop in EF to 35%.    After admission on 8/21, he was stabilized with blood transfusions and his bleeding resolved. He was going to be discharged on 8/24 but then had a large BM with drop in Hb. EGD 8/29 > large duodenal bulb ulcer with the large visible vessel and evidence of recent bleeding- treated with epinephrine injection and gold probe cautery. Patient had oozing of blood after gold probe cautery and subsequently Hemo spray was used with achievement of hemostasis. 8/30 - RUE PICC placed 9/4 he was found to have a PE and started on Heparin infusion. LE venous duplex negative on  9/6 palliative care consulted 9/7 developed hematemesis, hypotension and transferred to ICU for pressors- anticoagulation reversed and transfused PRBC- embolized by IR 9/9 - pressors stopped- transferred out of ICU   9/10 repeat EGD show no further bleeding 9/13 transferred back to SDU for acute resp failure related to fluid overloaded-   9/16 found to have RUE DVT related to PICC- Heparin resumed without bolus. 9/17 cardiology consulted for new systolic CHF evaluation, noted to have abnormal EKG (anterolateral T wave changes), Troponin 0.06- determined to not be a candidate for further intervention due to inability to adequately anticoagulate if stenting needed. Cardiology signed off. 9/18 Levophed tapered  off  9/19 transferred to [facility] 9/26  SW Assisting for placement at [place] AT SNF.   Assessment & Plan:   Principal Problem:   Systolic CHF, acute on chronic [facility]) Active Problems:   Morbid obesity with body mass index of 50.0-59.9 in adult [facility])   Essential hypertension   Encounter for central line placement   Impaired mobility   Fluid overload   Chronic venous insufficiency, Bilateral Legs   SOB (shortness of breath)   Gastrointestinal hemorrhage   Pressure injury of skin   Hemorrhagic shock (HCC)   Malnutrition of moderate degree   Anasarca   Acute pulmonary embolism (HCC)   Acute respiratory failure (HCC)   Duodenal ulcer   Abdominal pain   Leukocytosis   Elevated alkaline phosphatase measurement   Hypovolemic shock:  Secondary to GI bleed from duodenal ulcer. Patient had upper endoscopy on September 10.  And his Eliquis was restarted.  His hemoglobin appears to be stable between 8-9 range. Repeat H&H is around 8.1/ 27.1.  No signs of bleeding.   Resume BID PPI.    Acute on chronic hypercarbic respiratory failure/acute pulmonary embolism Appears to have resolved.  Resume CPAP at night. Pt is non compliant to CPAP at night.    Acute kidney injury patient had creatinine of 2.1 on August 21.  Creatinine has been stable between 0.7-1.1. Creatinine continues to remain stable. No intervention.    Morbid obesity:  Mostly pt is bed bound.   Hypertension mildly hypotensive earlier this am. 500 ml bolus given , repeat Bp normalized.   Adrenal insufficiency currently on Cortef  Chronic venous insufficiency Unna boots. Wound care recommendations .    Nutrition of moderate degree continue Protostat and boost.  Hypokalemia: replaced.   Fever:  UA abnormal, and urine cultures are negative.  CXR ordered  Showed Cardiomegaly with diffuse bilateral pulmonary infiltrates and bilateral pleural effusions most consistent with CHF.  Bilateral pneumonia cannot be excluded. Blood cultures drawn and grew e coli. He was started on rocephin. He will probably need 2 weeks of antibiotics.  Repeat blood cultures will be ordered tomorrow if he remains afebrile overnight.     Mild acute CHF:  - started him back on low dose lasix. Monitor BP while on lasix, hold if systolic is less than 782.   DVT prophylaxis: Heparin Code Status: No CPR, no intubation, can use BiPAP, administer ACLS meds, cardiovert if needed Family Communication: None at bedside Disposition Plan: awaiting disposition.    Consultants:   Gastroenterology  IR  Palliative care Cardiology    Procedures:EGD 8/29 >>large duodenal ulcer with visible vessel, biopsy c/w chronic gastritis, focal inflammation  EGD 9/10 >> no bleeding LE Doppler 9/9 >>negative  ECHO:  Left ventricle: The cavity size was normal. Systolic function was normal. The estimated ejection fraction was in the range of 60% to 65%. Wall motion was normal; there were no regional wall motion abnormalities. - Aortic valve: Transvalvular velocity was within the normal range. There was no stenosis. There was mild regurgitation. - Mitral valve: Mildly calcified annulus. Mildly thickened leaflets . Transvalvular velocity was within the normal range. There was no evidence for stenosis. There was no regurgitation. - Right ventricle: The cavity size was normal. Wall thickness was normal. Systolic function was normal. - Tricuspid valve: There was no regurgitation. - Pulmonary arteries: Systolic pressure was within the normal range. PA peak pressure: 30 mm Hg (S).   Antimicrobials: None  Subjective: Appears to be slightly confused this am, but denies any complaints.   Objective: Vitals:   09/26/18 0435 09/26/18 1011 09/26/18 1100 09/26/18 1326  BP: (!) 94/42 132/65 (!) 101/49 121/66  Pulse: 67 86 81 79  Resp: 20 (!) 24  15  Temp: 98.7 F (37.1 C) 98.1 F (36.7 C)   98.3 F (36.8 C)  TempSrc: Axillary Oral  Oral  SpO2: 100% 100%  100%  Weight:      Height:        Intake/Output Summary (Last 24 hours) at 09/26/2018 1634 Last data filed at 09/26/2018 1207 Gross per 24 hour  Intake 340 ml  Output 225 ml  Net 115 ml   Filed Weights   09/20/18 0437 09/24/18 0523 09/25/18 0542  Weight: (!) 142.6 kg (!) 136.5 kg 135.9 kg    Examination:  General exam: not in distress, using oxygen though, .  Respiratory system: diminished at bases, scattered rales.  Cardiovascular system: S1 & S2 heard, RRR. No JVD.  Gastrointestinal system: Abdomen is soft non tender non distended bowel sounds good.  Central nervous system:though he is alert, he appears confused.  Extremities: 2+ edema of the right upper extremity,  2+ edema of the legs with chronic venous stasis changes. legs are wrapped in Unna boots Skin: hyperkeratosis over the legs, with a small wound on the left foot.  Psychiatry: pt confused but  appears calm.     Data Reviewed: I have personally reviewed following labs and imaging studies  CBC: Recent Labs  Lab 09/22/18 0233 09/23/18 0318 09/24/18 0421 09/25/18 0746 09/26/18 0334  WBC 20.8* 10.9* 18.8* 17.9* 22.2*  HGB  9.5* 9.4* 8.6* 8.3* 8.1*  HCT 30.7* 31.1* 28.8* 27.7* 27.1*  MCV 92.2 93.7 95.0 96.9 96.4  PLT 225 255 235 231 588   Basic Metabolic Panel: Recent Labs  Lab 09/21/18 1330 09/22/18 0233 09/23/18 0318 09/24/18 0421 09/26/18 0334  NA 145 144 144 144 145  K 3.2* 3.2* 3.7 3.7 3.4*  CL 101 101 103 104 106  CO2 36* 35* 37* 36* 35*  GLUCOSE 189* 114* 129* 133* 119*  BUN 28* 21 18 16  26*  CREATININE 0.79 0.71 0.76 0.65 1.03  CALCIUM 8.4* 8.3* 8.4* 8.2* 7.7*   GFR: Estimated Creatinine Clearance: 84.4 mL/min (by C-G formula based on SCr of 1.03 mg/dL). Liver Function Tests: No results for input(s): AST, ALT, ALKPHOS, BILITOT, PROT, ALBUMIN in the last 168 hours. No results for input(s): LIPASE, AMYLASE in the last 168  hours. No results for input(s): AMMONIA in the last 168 hours. Coagulation Profile: No results for input(s): INR, PROTIME in the last 168 hours. Cardiac Enzymes: No results for input(s): CKTOTAL, CKMB, CKMBINDEX, TROPONINI in the last 168 hours. BNP (last 3 results) No results for input(s): PROBNP in the last 8760 hours. HbA1C: No results for input(s): HGBA1C in the last 72 hours. CBG: Recent Labs  Lab 09/25/18 1145 09/25/18 1710 09/25/18 2208 09/26/18 0717 09/26/18 1149  GLUCAP 134* 150* 100* 104* 175*   Lipid Profile: No results for input(s): CHOL, HDL, LDLCALC, TRIG, CHOLHDL, LDLDIRECT in the last 72 hours. Thyroid Function Tests: No results for input(s): TSH, T4TOTAL, FREET4, T3FREE, THYROIDAB in the last 72 hours. Anemia Panel: No results for input(s): VITAMINB12, FOLATE, FERRITIN, TIBC, IRON, RETICCTPCT in the last 72 hours. Sepsis Labs: Recent Labs  Lab 09/25/18 1427  LATICACIDVEN 2.2*    Recent Results (from the past 240 hour(s))  Culture, blood (Routine X 2) w Reflex to ID Panel     Status: None (Preliminary result)   Collection Time: 09/25/18  2:27 PM  Result Value Ref Range Status   Specimen Description   Final    BLOOD LEFT ARM Performed at [facility] [address], [place], [place] [address]    Special Requests   Final    BOTTLES DRAWN AEROBIC AND ANAEROBIC Blood Culture adequate volume   Culture  Setup Time PENDING  Incomplete   Culture   Final    NO GROWTH < 24 HOURS Performed at [facility], [place] [address], [place], [place] [address]    Report Status PENDING  Incomplete  Culture, blood (Routine X 2) w Reflex to ID Panel     Status: None (Preliminary result)   Collection Time: 09/25/18  2:29 PM  Result Value Ref Range Status   Specimen Description   Final    BLOOD LEFT HAND Performed at [facility] [address], [place], [place] [address]    Special Requests   Final    BOTTLES DRAWN AEROBIC  ONLY Blood Culture adequate volume   Culture  Setup Time   Final    GRAM NEGATIVE RODS AEROBIC BOTTLE ONLY CRITICAL RESULT CALLED TO, READ BACK BY AND VERIFIED WITH: Jodelle Green GREEN [id number] MLM Performed at [facility], [address] [place], [place] [address]    Culture GRAM NEGATIVE RODS  Final   Report Status PENDING  Incomplete  Blood Culture ID Panel (Reflexed)     Status: Abnormal   Collection Time: 09/25/18  2:29 PM  Result Value Ref Range Status  Enterococcus species NOT DETECTED NOT DETECTED Final   Listeria monocytogenes NOT DETECTED NOT DETECTED Final   Staphylococcus species NOT DETECTED NOT DETECTED Final   Staphylococcus aureus NOT DETECTED NOT DETECTED Final   Streptococcus species NOT DETECTED NOT DETECTED Final   Streptococcus agalactiae NOT DETECTED NOT DETECTED Final   Streptococcus pneumoniae NOT DETECTED NOT DETECTED Final   Streptococcus pyogenes NOT DETECTED NOT DETECTED Final   Acinetobacter baumannii NOT DETECTED NOT DETECTED Final   Enterobacteriaceae species DETECTED (A) NOT DETECTED Final    Comment: Enterobacteriaceae represent a large family of gram-negative bacteria, not a single organism. CRITICAL RESULT CALLED TO, READ BACK BY AND VERIFIED WITH: PHARMD B GREEN [id number] MLM    Enterobacter cloacae complex NOT DETECTED NOT DETECTED Final   Escherichia coli DETECTED (A) NOT DETECTED Final    Comment: CRITICAL RESULT CALLED TO, READ BACK BY AND VERIFIED WITH: PHARMD B GREEN [id number] MLM    Klebsiella oxytoca NOT DETECTED NOT DETECTED Final   Klebsiella pneumoniae NOT DETECTED NOT DETECTED Final   Proteus species NOT DETECTED NOT DETECTED Final   Serratia marcescens NOT DETECTED NOT DETECTED Final   Carbapenem resistance NOT DETECTED NOT DETECTED Final   Haemophilus influenzae NOT DETECTED NOT DETECTED Final   Neisseria meningitidis NOT DETECTED NOT DETECTED Final   Pseudomonas aeruginosa NOT DETECTED NOT DETECTED Final   Candida  albicans NOT DETECTED NOT DETECTED Final   Candida glabrata NOT DETECTED NOT DETECTED Final   Candida krusei NOT DETECTED NOT DETECTED Final   Candida parapsilosis NOT DETECTED NOT DETECTED Final   Candida tropicalis NOT DETECTED NOT DETECTED Final    Comment: Performed at [facility], [place]. [address] [place], [place] [address]  Culture, Urine     Status: None   Collection Time: 09/25/18  4:26 PM  Result Value Ref Range Status   Specimen Description   Final    URINE, CLEAN CATCH Performed at [facility], [place] [address] [place], [place] [address]    Special Requests   Final    NONE Performed at [facility], [place] [address] [place], [place] [address]    Culture   Final    Multiple bacterial morphotypes present, none predominant. Suggest appropriate recollection if clinically indicated.   Report Status 09/26/2018 FINAL  Final         Radiology Studies: Dg Chest Port 1 View  Result Date: 09/25/2018 CLINICAL DATA:  Fever. EXAM: PORTABLE CHEST 1 VIEW COMPARISON:  09/15/2018. FINDINGS: Interval removal of right PICC line. Cardiomegaly with diffuse bilateral pulmonary infiltrates and bilateral pleural effusions consistent CHF. Bilateral pneumonia cannot be excluded. No pneumothorax. IMPRESSION: 1. Cardiomegaly with diffuse bilateral pulmonary infiltrates and bilateral pleural effusions most consistent with CHF. Bilateral pneumonia cannot be excluded. Electronically Signed   By: Marcello Moores  Register   On: 09/25/2018 15:45        Scheduled Meds: . apixaban  5 mg Oral BID  . chlorhexidine  15 mL Mouth Rinse BID  . feeding supplement  1 Container Oral BID BM  . feeding supplement (PRO-STAT SUGAR FREE 64)  30 mL Oral BID  . furosemide  20 mg Oral Daily  . hydrocortisone  10 mg Oral Daily  . insulin aspart  0-15 Units Subcutaneous TID WC  . insulin aspart  0-5 Units Subcutaneous QHS  . latanoprost  1 drop Both Eyes QHS  . mouth rinse  15 mL  Mouth Rinse q12n4p  . metoprolol tartrate  12.5  mg Oral BID  . pantoprazole  40 mg Oral BID AC  . sucralfate  1 g Oral TID WC & HS   Continuous Infusions: . sodium chloride    . cefTRIAXone (ROCEPHIN)  IV 2 g (09/26/18 1153)     LOS: 38 days    Time spent:  45 minutes.     Hosie Poisson, MD [facility] Pager [phone]   If 7PM-7AM, please contact night-coverage [email or web address] Password [facility] 09/26/2018, 4:34 PM

## 2018-09-26 NOTE — [place] Notes (Signed)
Hypoglycemic Event  CBG: 54  Treatment: 161mL OJ  Symptoms: none  Follow-up CBG: Time: 1713 CBG Result: 78  Possible Reasons for Event: Pt skipped lunch  Comments/MD notified: Followed hypoglycemia nursing protocol    Carney Corners, RN

## 2018-09-26 NOTE — [place] Notes (Signed)
Pt has declined use of CPAP QHS, Pt has been educated repeatedly on the importance of utilizing CPAP but continues to decline.  RT to monitor and assess as needed.

## 2018-09-26 NOTE — [place] Notes (Signed)
Patient's blood pressure dropped to 89/45 at bedtime. NP on call was notified and order received for a 500cc NS bolus. Blood pressure after bolus was 95/49. Patient's blood pressure continued to run soft overnight and provider was made aware. Will continue to closely monitor patient.

## 2018-09-27 ENCOUNTER — Inpatient Hospital Stay (HOSPITAL_COMMUNITY): Payer: PPO

## 2018-09-27 LAB — GLUCOSE, CAPILLARY
GLUCOSE-CAPILLARY: 95 mg/dL (ref 70–99)
GLUCOSE-CAPILLARY: 101 mg/dL — AB (ref 70–99)
Glucose-Capillary: 111 mg/dL — ABNORMAL HIGH (ref 70–99)
Glucose-Capillary: 132 mg/dL — ABNORMAL HIGH (ref 70–99)

## 2018-09-27 LAB — BASIC METABOLIC PANEL
Anion gap: 7 (ref 5–15)
BUN: 23 mg/dL (ref 8–23)
CHLORIDE: 102 mmol/L (ref 98–111)
CO2: 36 mmol/L — ABNORMAL HIGH (ref 22–32)
CREATININE: 0.91 mg/dL (ref 0.61–1.24)
Calcium: 8.4 mg/dL — ABNORMAL LOW (ref 8.9–10.3)
GFR calc non Af Amer: >60 mL/min (ref >60)
Glucose, Bld: 157 mg/dL — ABNORMAL HIGH (ref 70–99)
Potassium: 3.8 mmol/L (ref 3.5–5.1)
SODIUM: 145 mmol/L (ref 135–145)

## 2018-09-27 LAB — AMMONIA: Ammonia: 39 umol/L — ABNORMAL HIGH (ref 9–35)

## 2018-09-27 LAB — BLOOD GAS, ARTERIAL
Acid-Base Excess: 7.9 mmol/L — ABNORMAL HIGH (ref 0.0–2.0)
Bicarbonate: 36.5 mmol/L — ABNORMAL HIGH (ref 20.0–28.0)
Drawn by: 270211
O2 CONTENT: 3 L/min
O2 SAT: 92.4 %
PATIENT TEMPERATURE: 98.6
PCO2 ART: 89.6 mmHg — AB (ref 32.0–48.0)
PO2 ART: 64.8 mmHg — AB (ref 83.0–108.0)
pH, Arterial: 7.234 — ABNORMAL LOW (ref 7.350–7.450)

## 2018-09-27 LAB — CBC
HCT: 31.4 % — ABNORMAL LOW (ref 39.0–52.0)
Hemoglobin: 8.9 g/dL — ABNORMAL LOW (ref 13.0–17.0)
MCH: 28.2 pg (ref 26.0–34.0)
MCHC: 28.3 g/dL — ABNORMAL LOW (ref 30.0–36.0)
MCV: 99.4 fL (ref 78.0–100.0)
Platelets: 228 K/uL (ref 150–400)
RBC: 3.16 MIL/uL — ABNORMAL LOW (ref 4.22–5.81)
RDW: 14.8 % (ref 11.5–15.5)
WBC: 22.5 K/uL — ABNORMAL HIGH (ref 4.0–10.5)

## 2018-09-27 LAB — LACTIC ACID, PLASMA: Lactic Acid, Venous: 0.8 mmol/L (ref 0.5–1.9)

## 2018-09-27 NOTE — [place] Notes (Signed)
Pt transferred from 1425 to ICU, 1229. Report given to the RN on the unit. Family was updated on status changes by MD on patient and family is aware. Pt currently stable at this time.

## 2018-09-27 NOTE — [place] Notes (Signed)
RT attempted to place CPAP per request from RN and MD. Pt refused. Will continue to monitor pt.

## 2018-09-27 NOTE — Progress Notes (Signed)
PHARMACY - PHYSICIAN COMMUNICATION CRITICAL VALUE ALERT - BLOOD CULTURE IDENTIFICATION (BCID)  [name] is an 77 y.o. male who presented to [facility] on 08/19/2018 with a chief complaint of GIB, on treatment for Cdiff.  History also includes CHF, cardiomyopathy, DM, GERD, chronic osteomyelitis/wounds, Prostate cancer, HTN.  Assessment:  28 yoM with prolonged, complex admission for recurrent GIB, acute PE, respiratory failure, AKI, shock, chronic venous insufficiency and chronic osteomyelitis of lower leg.  Hypotension, Lactic acid increased to 2.2, Tm 100.5 on 9/27.  Blood and urine cultures obtained.  CXR shows bilateral pulmonary infiltrates and pleural effusions most consistent with CHF, but cannot exclude bilateral pneumonia.  Unknown potential source at this time.  Name of physician (or Provider) ContactedKarleen Hampshire  Current antibiotics: Ceftriaxone 2g IV q24h  Changes to prescribed antibiotics recommended:  Patient is on recommended antibiotics - No changes needed  Results for orders placed or performed during the hospital encounter of 08/19/18  Blood Culture ID Panel (Reflexed) (Collected: 09/25/2018  2:29 PM)  Result Value Ref Range   Enterococcus species NOT DETECTED NOT DETECTED   Listeria monocytogenes NOT DETECTED NOT DETECTED   Staphylococcus species NOT DETECTED NOT DETECTED   Staphylococcus aureus NOT DETECTED NOT DETECTED   Streptococcus species NOT DETECTED NOT DETECTED   Streptococcus agalactiae NOT DETECTED NOT DETECTED   Streptococcus pneumoniae NOT DETECTED NOT DETECTED   Streptococcus pyogenes NOT DETECTED NOT DETECTED   Acinetobacter baumannii NOT DETECTED NOT DETECTED   Enterobacteriaceae species DETECTED (A) NOT DETECTED   Enterobacter cloacae complex NOT DETECTED NOT DETECTED   Escherichia coli DETECTED (A) NOT DETECTED   Klebsiella oxytoca NOT DETECTED NOT DETECTED   Klebsiella pneumoniae NOT DETECTED NOT DETECTED   Proteus species NOT DETECTED NOT  DETECTED   Serratia marcescens NOT DETECTED NOT DETECTED   Carbapenem resistance NOT DETECTED NOT DETECTED   Haemophilus influenzae NOT DETECTED NOT DETECTED   Neisseria meningitidis NOT DETECTED NOT DETECTED   Pseudomonas aeruginosa NOT DETECTED NOT DETECTED   Candida albicans NOT DETECTED NOT DETECTED   Candida glabrata NOT DETECTED NOT DETECTED   Candida krusei NOT DETECTED NOT DETECTED   Candida parapsilosis NOT DETECTED NOT DETECTED   Candida tropicalis NOT DETECTED NOT DETECTED    Gretta Arab PharmD, BCPS Pager [phone] 09/27/2018 8:30 AM

## 2018-09-27 NOTE — Progress Notes (Signed)
[place] for Apixaban Indication: pulmonary embolus and DVT  Allergies  Allergen Reactions  . Ace Inhibitors Cough    With Lisinopril.  . Angiotensin Receptor Blockers Other (See Comments)    Caused excessive weight gain  . Metformin Diarrhea    Patient Measurements: Height: 6' (182.9 cm) Weight: (!) 308 lb 10.3 oz (140 kg) IBW/kg (Calculated) : 77.6   Vital Signs: Temp: 98.1 F (36.7 C) (09/29 0415) Temp Source: Oral (09/29 0415) BP: 104/52 (09/29 0415) Pulse Rate: 83 (09/29 0415)  Labs: Recent Labs    09/25/18 0746 09/26/18 0334 09/27/18 0447  HGB 8.3* 8.1* 8.9*  HCT 27.7* 27.1* 31.4*  PLT 231 218 228  CREATININE  --  1.03  --     Estimated Creatinine Clearance: 85.8 mL/min (by C-G formula based on SCr of 1.03 mg/dL).   . sodium chloride    . cefTRIAXone (ROCEPHIN)  IV 2 g (09/26/18 1153)   Assessment: Patient is a 77 year old male with a prolonged hospitalization, admitted on 8/21 for GI bleed, then complicated by a PE and UE DVT.  He was started on heparin drip and tolerated it, but after being converted to apixaban on 9/6 PM, patient developed GI bleeding requiring Andexxa reversal and embolization on 9/7.  Repeat EGD showed no new bleeding on 9/10.  Heparin drip was resumed on 9/16 for recent PE and new RUE DVT related to PICC line.  He has tolerated the Heparin gtt for ~3.5 days.  Pharmacy then consulted to transition from Heparin back to Apixaban on 9/20. Per discussion with MD on 9/20, patient and MD are aware of risks of resuming the same DOAC, apixaban s/p GIB requiring Andexxa reversal.  Today, 09/27/2018:  CBC:  Hgb remains low/stable at 8.9, Pltc remains WNL  SCr increased to 1.03 today with CrCl ~ 85 ml/min.  No dose adjustment required.  RN reports no bleeding or complications, no blood in BM.  Regular BMs.  Goal of Therapy:  Therapeutic anticoagulation, prevention of recurrent VTE Absence of bleeding  complications  Monitor platelets by anticoagulation protocol: Yes   Plan:   Continue Apixaban 5mg  PO BID   Monitor CBC daily  Monitor for s/sx of bleeding.    Gretta Arab PharmD, BCPS Pager [phone] 09/27/2018 8:33 AM

## 2018-09-27 NOTE — Progress Notes (Addendum)
PROGRESS NOTE    [name]  RCV:[id number] DOB: 07-03-1941 DOA: 08/19/2018 PCP: [place] Bing, DO   Brief Narrative:  [name] is a 77 y/o with PMH of DM with neuropathy, chronic venous insufficiency, chronic osteomyelitis, chronic systolic and diastolic CHF (EF 10-25%), ischemic cardiomyopathy, HTN, Prostate CA who was being treated for c diff colitis as outpt and admitted for melena, hypotension, AKI with a Hb of 6.7 and a BUN/Cr of 111/2.1.  7/30-8/8>> He had a hospitalization for sepsis (urine/ pneumonia/ soft tissue) & PEA cardiac arrest suspected to be from a CHF exacerbation with anew drop in EF to 35%.    After admission on 8/21, he was stabilized with blood transfusions and his bleeding resolved. He was going to be discharged on 8/24 but then had a large BM with drop in Hb. EGD 8/29 > large duodenal bulb ulcer with the large visible vessel and evidence of recent bleeding- treated with epinephrine injection and gold probe cautery. Patient had oozing of blood after gold probe cautery and subsequently Hemo spray was used with achievement of hemostasis. 8/30 - RUE PICC placed 9/4 he was found to have a PE and started on Heparin infusion. LE venous duplex negative on  9/6 palliative care consulted 9/7 developed hematemesis, hypotension and transferred to ICU for pressors- anticoagulation reversed and transfused PRBC- embolized by IR 9/9 - pressors stopped- transferred out of ICU   9/10 repeat EGD show no further bleeding 9/13 transferred back to SDU for acute resp failure related to fluid overloaded-   9/16 found to have RUE DVT related to PICC- Heparin resumed without bolus. 9/17 cardiology consulted for new systolic CHF evaluation, noted to have abnormal EKG (anterolateral T wave changes), Troponin 0.06- determined to not be a candidate for further intervention due to inability to adequately anticoagulate if stenting needed. Cardiology signed off. 9/18 Levophed tapered  off  9/19 transferred to [facility] 9/26  SW Assisting for placement at [facility].   Assessment & Plan:   Principal Problem:   Systolic CHF, acute on chronic [facility]) Active Problems:   Morbid obesity with body mass index of 50.0-59.9 in adult [facility])   Essential hypertension   Encounter for central line placement   Impaired mobility   Fluid overload   Chronic venous insufficiency, Bilateral Legs   SOB (shortness of breath)   Gastrointestinal hemorrhage   Pressure injury of skin   Hemorrhagic shock (HCC)   Malnutrition of moderate degree   Anasarca   Acute pulmonary embolism (HCC)   Acute respiratory failure (HCC)   Duodenal ulcer   Abdominal pain   Leukocytosis   Elevated alkaline phosphatase measurement   Hypovolemic shock:  Secondary to GI bleed from duodenal ulcer. Patient had upper endoscopy on September 10.  And his Eliquis was restarted.  His hemoglobin appears to be stable between 8-9 range. Repeat H&H is around 8.9/ 31.4.  No signs of bleeding.   Resume BID PPI.    Acute on chronic hypercarbic respiratory failure/acute pulmonary embolism Appears to have resolved.  Resume CPAP at night. Pt is non compliant to CPAP at night. He keeps refusing CPAP.    Acute kidney injury patient had creatinine of 2.1 on August 21.  Creatinine has been stable between 0.7-1.1. Creatinine continues to remain stable. No intervention.    Morbid obesity:  Mostly pt is bed bound.   Hypertension mildly hypotensive earlier this am. 500 ml bolus given , repeat Bp normalized.   Adrenal  insufficiency currently on Cortef   Chronic venous insufficiency Unna boots. Wound care recommendations .    Nutrition of moderate degree continue Protostat and boost.  Hypokalemia: replaced. Repeat BMP in am.   Fever:  UA abnormal, and urine cultures are negative.  CXR ordered  Showed Cardiomegaly with diffuse bilateral pulmonary infiltrates and bilateral pleural effusions  most consistent with CHF. Bilateral pneumonia cannot be excluded. Restarted him on low dose lasix.  Blood cultures drawn and grew e coli. He was started on rocephin. He will probably need 2 weeks of antibiotics.  Repeat blood cultures will be ordered tomorrow if he remains afebrile overnight.     Mild acute CHF:  - started him back on low dose lasix. Monitor BP while on lasix, hold if systolic is less than 347.   DVT prophylaxis: Heparin Code Status: No CPR, no intubation, can use BiPAP, administer ACLS meds, cardiovert if needed Family Communication: None at bedside Disposition Plan: awaiting disposition.    Consultants:   Gastroenterology  IR  Palliative care Cardiology    Procedures:EGD 8/29 >>large duodenal ulcer with visible vessel, biopsy c/w chronic gastritis, focal inflammation  EGD 9/10 >> no bleeding LE Doppler 9/9 >>negative  ECHO:  Left ventricle: The cavity size was normal. Systolic function was normal. The estimated ejection fraction was in the range of 60% to 65%. Wall motion was normal; there were no regional wall motion abnormalities. - Aortic valve: Transvalvular velocity was within the normal range. There was no stenosis. There was mild regurgitation. - Mitral valve: Mildly calcified annulus. Mildly thickened leaflets . Transvalvular velocity was within the normal range. There was no evidence for stenosis. There was no regurgitation. - Right ventricle: The cavity size was normal. Wall thickness was normal. Systolic function was normal. - Tricuspid valve: There was no regurgitation. - Pulmonary arteries: Systolic pressure was within the normal range. PA peak pressure: 30 mm Hg (S).   Antimicrobials: None  Subjective: He is lethargic, but able to move all extremities. He is refusing CPAP despite persuasion.   Objective: Vitals:   09/26/18 2156 09/27/18 0415 09/27/18 0417 09/27/18 1037  BP: (!) 108/53 (!) 104/52  115/61  Pulse:  80 83  78  Resp:  18    Temp:  98.1 F (36.7 C)    TempSrc:  Oral    SpO2:  96%    Weight:   (!) 140 kg   Height:        Intake/Output Summary (Last 24 hours) at 09/27/2018 1220 Last data filed at 09/27/2018 1158 Gross per 24 hour  Intake 812 ml  Output 1175 ml  Net -363 ml   Filed Weights   09/24/18 0523 09/25/18 0542 09/27/18 0417  Weight: (!) 136.5 kg 135.9 kg (!) 140 kg    Examination:  General exam: not in distress, on 2 to 3 lit of OXYGEN.  Respiratory system: diminished at bases, scattered rales. No wheezing heard.  Cardiovascular system: S1 & S2 heard, RRR. No JVD. Murmer present.  Gastrointestinal system: Abdomen is soft NT ND BS+ Central nervous system:though he is alert, he appears confused.  Extremities: 2+ edema of the right upper extremity,  2+ edema of the legs with chronic venous stasis changes. legs are wrapped in Unna boots Skin: hyperkeratosis over the legs, with a small wound on the left foot.  Psychiatry: pt confused but  appears calm.     Data Reviewed: I have personally reviewed following labs and imaging studies  CBC: Recent Labs  Lab  09/23/18 8416 09/24/18 0421 09/25/18 0746 09/26/18 0334 09/27/18 0447  WBC 10.9* 18.8* 17.9* 22.2* 22.5*  HGB 9.4* 8.6* 8.3* 8.1* 8.9*  HCT 31.1* 28.8* 27.7* 27.1* 31.4*  MCV 93.7 95.0 96.9 96.4 99.4  PLT 255 235 231 218 606   Basic Metabolic Panel: Recent Labs  Lab 09/21/18 1330 09/22/18 0233 09/23/18 0318 09/24/18 0421 09/26/18 0334  NA 145 144 144 144 145  K 3.2* 3.2* 3.7 3.7 3.4*  CL 101 101 103 104 106  CO2 36* 35* 37* 36* 35*  GLUCOSE 189* 114* 129* 133* 119*  BUN 28* 21 18 16  26*  CREATININE 0.79 0.71 0.76 0.65 1.03  CALCIUM 8.4* 8.3* 8.4* 8.2* 7.7*   GFR: Estimated Creatinine Clearance: 85.8 mL/min (by C-G formula based on SCr of 1.03 mg/dL). Liver Function Tests: No results for input(s): AST, ALT, ALKPHOS, BILITOT, PROT, ALBUMIN in the last 168 hours. No results for input(s): LIPASE,  AMYLASE in the last 168 hours. No results for input(s): AMMONIA in the last 168 hours. Coagulation Profile: No results for input(s): INR, PROTIME in the last 168 hours. Cardiac Enzymes: No results for input(s): CKTOTAL, CKMB, CKMBINDEX, TROPONINI in the last 168 hours. BNP (last 3 results) No results for input(s): PROBNP in the last 8760 hours. HbA1C: No results for input(s): HGBA1C in the last 72 hours. CBG: Recent Labs  Lab 09/26/18 1650 09/26/18 1713 09/26/18 2125 09/27/18 0723 09/27/18 1156  GLUCAP 54* 78 117* 111* 132*   Lipid Profile: No results for input(s): CHOL, HDL, LDLCALC, TRIG, CHOLHDL, LDLDIRECT in the last 72 hours. Thyroid Function Tests: No results for input(s): TSH, T4TOTAL, FREET4, T3FREE, THYROIDAB in the last 72 hours. Anemia Panel: No results for input(s): VITAMINB12, FOLATE, FERRITIN, TIBC, IRON, RETICCTPCT in the last 72 hours. Sepsis Labs: Recent Labs  Lab 09/25/18 1427  LATICACIDVEN 2.2*    Recent Results (from the past 240 hour(s))  Culture, blood (Routine X 2) w Reflex to ID Panel     Status: None (Preliminary result)   Collection Time: 09/25/18  2:27 PM  Result Value Ref Range Status   Specimen Description   Final    BLOOD LEFT ARM Performed at [facility] [address], [place], [place] [address]    Special Requests   Final    BOTTLES DRAWN AEROBIC AND ANAEROBIC Blood Culture adequate volume   Culture  Setup Time   Final    GRAM NEGATIVE RODS IN BOTH AEROBIC AND ANAEROBIC BOTTLES CRITICAL VALUE NOTED.  VALUE IS CONSISTENT WITH PREVIOUSLY REPORTED AND CALLED VALUE. Performed at [facility], [place] [address] [place], [place] [address]    Culture GRAM NEGATIVE RODS  Final   Report Status PENDING  Incomplete  Culture, blood (Routine X 2) w Reflex to ID Panel     Status: Abnormal (Preliminary result)   Collection Time: 09/25/18  2:29 PM  Result Value Ref Range Status   Specimen Description   Final    BLOOD  LEFT HAND Performed at [facility] [address], [place], [place] [address]    Special Requests   Final    BOTTLES DRAWN AEROBIC ONLY Blood Culture adequate volume   Culture  Setup Time   Final    GRAM NEGATIVE RODS AEROBIC BOTTLE ONLY CRITICAL RESULT CALLED TO, READ BACK BY AND VERIFIED WITH: PHARMD B GREEN [id number] MLM    Culture (A)  Final    ESCHERICHIA COLI SUSCEPTIBILITIES TO FOLLOW Performed at [facility], [place] [address], [place], [place] [address]    Report Status PENDING  Incomplete  Blood Culture ID Panel (Reflexed)     Status: Abnormal   Collection Time: 09/25/18  2:29 PM  Result Value Ref Range Status   Enterococcus species NOT DETECTED NOT DETECTED Final   Listeria monocytogenes NOT DETECTED NOT DETECTED Final   Staphylococcus species NOT DETECTED NOT DETECTED Final   Staphylococcus aureus NOT DETECTED NOT DETECTED Final   Streptococcus species NOT DETECTED NOT DETECTED Final   Streptococcus agalactiae NOT DETECTED NOT DETECTED Final   Streptococcus pneumoniae NOT DETECTED NOT DETECTED Final   Streptococcus pyogenes NOT DETECTED NOT DETECTED Final   Acinetobacter baumannii NOT DETECTED NOT DETECTED Final   Enterobacteriaceae species DETECTED (A) NOT DETECTED Final    Comment: Enterobacteriaceae represent a large family of gram-negative bacteria, not a single organism. CRITICAL RESULT CALLED TO, READ BACK BY AND VERIFIED WITH: PHARMD B GREEN [id number] MLM    Enterobacter cloacae complex NOT DETECTED NOT DETECTED Final   Escherichia coli DETECTED (A) NOT DETECTED Final    Comment: CRITICAL RESULT CALLED TO, READ BACK BY AND VERIFIED WITH: PHARMD B GREEN [id number] MLM    Klebsiella oxytoca NOT DETECTED NOT DETECTED Final   Klebsiella pneumoniae NOT DETECTED NOT DETECTED Final   Proteus species NOT DETECTED NOT DETECTED Final   Serratia marcescens NOT DETECTED NOT DETECTED Final   Carbapenem resistance NOT DETECTED NOT DETECTED  Final   Haemophilus influenzae NOT DETECTED NOT DETECTED Final   Neisseria meningitidis NOT DETECTED NOT DETECTED Final   Pseudomonas aeruginosa NOT DETECTED NOT DETECTED Final   Candida albicans NOT DETECTED NOT DETECTED Final   Candida glabrata NOT DETECTED NOT DETECTED Final   Candida krusei NOT DETECTED NOT DETECTED Final   Candida parapsilosis NOT DETECTED NOT DETECTED Final   Candida tropicalis NOT DETECTED NOT DETECTED Final    Comment: Performed at [facility], [place]. [address], [place], [place] [address]  Culture, Urine     Status: None   Collection Time: 09/25/18  4:26 PM  Result Value Ref Range Status   Specimen Description   Final    URINE, CLEAN CATCH Performed at [facility], [place] [address] [place], [place] [address]    Special Requests   Final    NONE Performed at [facility], [place] [address], [place], [place] [address]    Culture   Final    Multiple bacterial morphotypes present, none predominant. Suggest appropriate recollection if clinically indicated.   Report Status 09/26/2018 FINAL  Final         Radiology Studies: Dg Chest Port 1 View  Result Date: 09/25/2018 CLINICAL DATA:  Fever. EXAM: PORTABLE CHEST 1 VIEW COMPARISON:  09/15/2018. FINDINGS: Interval removal of right PICC line. Cardiomegaly with diffuse bilateral pulmonary infiltrates and bilateral pleural effusions consistent CHF. Bilateral pneumonia cannot be excluded. No pneumothorax. IMPRESSION: 1. Cardiomegaly with diffuse bilateral pulmonary infiltrates and bilateral pleural effusions most consistent with CHF. Bilateral pneumonia cannot be excluded. Electronically Signed   By: Marcello Moores  Register   On: 09/25/2018 15:45        Scheduled Meds: . apixaban  5 mg Oral BID  . chlorhexidine  15 mL Mouth Rinse BID  . feeding supplement  1 Container Oral BID BM  . feeding supplement (PRO-STAT SUGAR FREE 64)  30 mL Oral BID  . furosemide  20 mg Oral Daily  .  hydrocortisone  10 mg Oral  Daily  . insulin aspart  0-15 Units Subcutaneous TID WC  . insulin aspart  0-5 Units Subcutaneous QHS  . latanoprost  1 drop Both Eyes QHS  . mouth rinse  15 mL Mouth Rinse q12n4p  . metoprolol tartrate  12.5 mg Oral BID  . pantoprazole  40 mg Oral BID AC  . sucralfate  1 g Oral TID WC & HS   Continuous Infusions: . sodium chloride    . cefTRIAXone (ROCEPHIN)  IV 2 g (09/27/18 1034)     LOS: 39 days    Time spent:  32 minutes.     Hosie Poisson, MD [facility] Pager [phone]   If 7PM-7AM, please contact night-coverage [email or web address] Password [facility] 09/27/2018, 12:20 PM    Addendum: Patient more lethargic than usual. He continues to refuse CPAP, suspect he is retaining pco2.  Get ABG, lactic acid, BMP, repeat blood cultures and cbc done today.  He is alert and answering simple questions, but very confused.  I think he will benefit from BIPAP AND will need to be transferred to step down for that. I called the daughters and discussed with them the plan.    Hosie Poisson, MD [phone]

## 2018-09-28 LAB — CBC
HCT: 27.2 % — ABNORMAL LOW (ref 39.0–52.0)
HEMOGLOBIN: 8 g/dL — AB (ref 13.0–17.0)
MCH: 28.5 pg (ref 26.0–34.0)
MCHC: 29.4 g/dL — AB (ref 30.0–36.0)
MCV: 96.8 fL (ref 78.0–100.0)
Platelets: 204 10*3/uL (ref 150–400)
RBC: 2.81 MIL/uL — ABNORMAL LOW (ref 4.22–5.81)
RDW: 14.7 % (ref 11.5–15.5)
WBC: 12.7 10*3/uL — ABNORMAL HIGH (ref 4.0–10.5)

## 2018-09-28 LAB — GLUCOSE, CAPILLARY
GLUCOSE-CAPILLARY: 122 mg/dL — AB (ref 70–99)
GLUCOSE-CAPILLARY: 166 mg/dL — AB (ref 70–99)
GLUCOSE-CAPILLARY: 147 mg/dL — AB (ref 70–99)
Glucose-Capillary: 105 mg/dL — ABNORMAL HIGH (ref 70–99)

## 2018-09-28 LAB — CULTURE, BLOOD (ROUTINE X 2): SPECIAL REQUESTS: ADEQUATE

## 2018-09-28 LAB — BASIC METABOLIC PANEL
Anion gap: 7 (ref 5–15)
BUN: 23 mg/dL (ref 8–23)
CHLORIDE: 106 mmol/L (ref 98–111)
CO2: 37 mmol/L — ABNORMAL HIGH (ref 22–32)
CREATININE: 0.81 mg/dL (ref 0.61–1.24)
Calcium: 8.6 mg/dL — ABNORMAL LOW (ref 8.9–10.3)
GFR calc Af Amer: >60 mL/min (ref >60)
GFR calc non Af Amer: >60 mL/min (ref >60)
Glucose, Bld: 105 mg/dL — ABNORMAL HIGH (ref 70–99)
Potassium: 3.4 mmol/L — ABNORMAL LOW (ref 3.5–5.1)
SODIUM: 150 mmol/L — AB (ref 135–145)

## 2018-09-28 LAB — MRSA PCR SCREENING: MRSA BY PCR: NEGATIVE

## 2018-09-28 MED ORDER — FREE WATER
200.0000 mL | Freq: Three times a day (TID) | Status: DC
  Administered 2018-09-28 – 2018-10-01 (×7): 200 mL via ORAL

## 2018-09-28 NOTE — Progress Notes (Signed)
   09/28/18 1602  Clinical Encounter Type  Visited With Patient  Visit Type Follow-up  Referral From Chaplain  Consult/Referral To Chaplain  Chaplain followed up with Pt. in ICU. Pt. was sitting up, holding tightly to cell phone cord.  Chaplain re-introduced herself with a point of reference of being present with Pt. and previous friend visit. Pt. did not immediately connect with visit, but did remember the blood draw at the same time.  Pt. interacted with chaplain throughout visit. Chaplain asked Pt. how are you this afternoon?  Pt. replied I am waiting for doctor visit to give me more information.  Chaplain recognized the importance of the information to the Pt. Pt. reflected back to anticipated health team visit during chaplain visit several times. Pt. continued to refer to the decisions he has to make without giving any detail. Chaplain connected family members with decision making process. The Pt. stated sometimes there are too many people involved in a decision. Chaplain valued the Pt. involvement. The Pt. replied I have been in the hospital for a long time. The chaplain offered to continue the conversation on another day when Pt. was ready to talk. The Pt. accepted. The Chaplain asked the Pt. If she could pray for the Pt. decisions.  The Pt. said no, I don't think it is the right time. The chaplain replied, please let me know when the time is best.

## 2018-09-28 NOTE — Progress Notes (Addendum)
PROGRESS NOTE    [name]  WLN:[id number] DOB: [birth year] DOA: 08/19/2018 PCP: [place] Bing, DO   Brief Narrative:  [name] is a 77 y/o with PMH of DM with neuropathy, chronic venous insufficiency, chronic osteomyelitis, chronic systolic and diastolic CHF (EF 74-08%), ischemic cardiomyopathy, HTN, Prostate CA who was being treated for c diff colitis as outpt and admitted for melena, hypotension, AKI with a Hb of 6.7 and a BUN/Cr of 111/2.1.  7/30-8/8>> He had a hospitalization for sepsis (urine/ pneumonia/ soft tissue) & PEA cardiac arrest suspected to be from a CHF exacerbation with anew drop in EF to 35%.    After admission on 8/21, he was stabilized with blood transfusions and his bleeding resolved. He was going to be discharged on 8/24 but then had a large BM with drop in Hb. EGD 8/29 > large duodenal bulb ulcer with the large visible vessel and evidence of recent bleeding- treated with epinephrine injection and gold probe cautery. Patient had oozing of blood after gold probe cautery and subsequently Hemo spray was used with achievement of hemostasis. 8/30 - RUE PICC placed 9/4 he was found to have a PE and started on Heparin infusion. LE venous duplex negative on  9/6 palliative care consulted 9/7 developed hematemesis, hypotension and transferred to ICU for pressors- anticoagulation reversed and transfused PRBC- embolized by IR 9/9 - pressors stopped- transferred out of ICU   9/10 repeat EGD show no further bleeding 9/13 transferred back to SDU for acute resp failure related to fluid overloaded-   9/16 found to have RUE DVT related to PICC- Heparin resumed without bolus. 9/17 cardiology consulted for new systolic CHF evaluation, noted to have abnormal EKG (anterolateral T wave changes), Troponin 0.06- determined to not be a candidate for further intervention due to inability to adequately anticoagulate if stenting needed. Cardiology signed off. 9/18 Levophed tapered  off  9/19 transferred to [facility] 9/26  SW Assisting for placement at [facility].   Assessment & Plan:   Principal Problem:   Systolic CHF, acute on chronic [facility]) Active Problems:   Morbid obesity with body mass index of 50.0-59.9 in adult [facility])   Essential hypertension   Encounter for central line placement   Impaired mobility   Fluid overload   Chronic venous insufficiency, Bilateral Legs   SOB (shortness of breath)   Gastrointestinal hemorrhage   Pressure injury of skin   Hemorrhagic shock (HCC)   Malnutrition of moderate degree   Anasarca   Acute pulmonary embolism (HCC)   Acute respiratory failure (HCC)   Duodenal ulcer   Abdominal pain   Leukocytosis   Elevated alkaline phosphatase measurement   Hypovolemic shock:  Secondary to GI bleed from duodenal ulcer. Patient had upper endoscopy on September 10.  And his Eliquis was restarted.  His hemoglobin appears to be stable between 8-9 range. Repeat H&H is around 8/ 27.2.  No signs of bleeding.   Resume BID PPI.    Acute on chronic hypercarbic respiratory failure/acute pulmonary embolism Appears to have resolved.  Resume CPAP at night. Pt is non compliant to CPAP at night. He keeps refusing CPAP.    Acute kidney injury : resolved. Creatinine has been stable between 0.7-1.1.  Morbid obesity:  Mostly pt is bed bound.   Hypertension : resolved.   Adrenal insufficiency currently on Cortef   Chronic venous insufficiency Unna boots. Wound care recommendations .   Nutrition of moderate degree continue Protostat and boost.  Hypokalemia:  replaced. Repeat BMP in am.   Fever: ? Ecoli bacteremia:  UA abnormal, and urine cultures are negative.  CXR ordered  Showed Cardiomegaly with diffuse bilateral pulmonary infiltrates and bilateral pleural effusions most consistent with CHF. Bilateral pneumonia cannot be excluded. Restarted him on low dose lasix.  Blood cultures drawn and grew e coli. He was  started on rocephin. He will probably need 2 weeks of antibiotics.  Repeat blood cultures negative so far.    Mild acute CHF:  - started him back on low dose lasix.   Acute encephalopathy probably metabolic from respiratory acidosis and hypernatremia - improved slightly from yesterday.  - CT head negative.    Acute respiratory failure with hypercapnia and hypoxia Pt hypercarbic , evident on ABG had to be transferred to stepdown for BIPAP overnight and this am, pt took off the BIPAP.  Currently on 3 lit of [place] oxygen.    Hypernatremia:  - poor po intake, encourage oral free water intake.  - repeat in am.      DVT prophylaxis: Heparin Code Status: No CPR, no intubation, can use BiPAP, administer ACLS meds, cardiovert if needed Family Communication: None at bedside Disposition Plan: awaiting disposition.    Consultants:   Gastroenterology  IR  Palliative care Cardiology    Procedures:EGD 8/29 >>large duodenal ulcer with visible vessel, biopsy c/w chronic gastritis, focal inflammation  EGD 9/10 >> no bleeding LE Doppler 9/9 >>negative  ECHO:  Left ventricle: The cavity size was normal. Systolic function was normal. The estimated ejection fraction was in the range of 60% to 65%. Wall motion was normal; there were no regional wall motion abnormalities. - Aortic valve: Transvalvular velocity was within the normal range. There was no stenosis. There was mild regurgitation. - Mitral valve: Mildly calcified annulus. Mildly thickened leaflets . Transvalvular velocity was within the normal range. There was no evidence for stenosis. There was no regurgitation. - Right ventricle: The cavity size was normal. Wall thickness was normal. Systolic function was normal. - Tricuspid valve: There was no regurgitation. - Pulmonary arteries: Systolic pressure was within the normal range. PA peak pressure: 30 mm Hg (S).   Antimicrobials:  None  Subjective: Continues to be lethargic, answering simple questions.    Objective: Vitals:   09/28/18 1000 09/28/18 1100 09/28/18 1200 09/28/18 1237  BP: (!) 159/67   123/60  Pulse:   64 66  Resp: 14 18 16  (!) 22  Temp:      TempSrc:      SpO2:   100% 100%  Weight:      Height:        Intake/Output Summary (Last 24 hours) at 09/28/2018 1433 Last data filed at 09/28/2018 1200 Gross per 24 hour  Intake 200.22 ml  Output 1500 ml  Net -1299.78 ml   Filed Weights   09/25/18 0542 09/27/18 0417 09/28/18 0600  Weight: 135.9 kg (!) 140 kg (!) 142.2 kg    Examination:  General exam: not in distress, on 2 to 3 lit of OXYGEN.  Respiratory system: diminished air entry at bases.  Cardiovascular system: S1 & S2 heard, RRR. No JVD. Murmer present.  Gastrointestinal system: Abdomen is soft non tender non distended bowel sounds heard.  Central nervous system: though he is alert, he appears confused.  Extremities: 2+ edema of the right upper extremity,  2+ edema of the legs with chronic venous stasis changes. legs are wrapped in Unna boots Skin: hyperkeratosis over the legs, with a small wound on the  left foot.  Psychiatry: pt confused but  appears calm.     Data Reviewed: I have personally reviewed following labs and imaging studies  CBC: Recent Labs  Lab 09/24/18 0421 09/25/18 0746 09/26/18 0334 09/27/18 0447 09/28/18 0638  WBC 18.8* 17.9* 22.2* 22.5* 12.7*  HGB 8.6* 8.3* 8.1* 8.9* 8.0*  HCT 28.8* 27.7* 27.1* 31.4* 27.2*  MCV 95.0 96.9 96.4 99.4 96.8  PLT 235 231 218 228 160   Basic Metabolic Panel: Recent Labs  Lab 09/23/18 0318 09/24/18 0421 09/26/18 0334 09/27/18 1609 09/28/18 0638  NA 144 144 145 145 150*  K 3.7 3.7 3.4* 3.8 3.4*  CL 103 104 106 102 106  CO2 37* 36* 35* 36* 37*  GLUCOSE 129* 133* 119* 157* 105*  BUN 18 16 26* 23 23  CREATININE 0.76 0.65 1.03 0.91 0.81  CALCIUM 8.4* 8.2* 7.7* 8.4* 8.6*   GFR: Estimated Creatinine Clearance: 109.9  mL/min (by C-G formula based on SCr of 0.81 mg/dL). Liver Function Tests: No results for input(s): AST, ALT, ALKPHOS, BILITOT, PROT, ALBUMIN in the last 168 hours. No results for input(s): LIPASE, AMYLASE in the last 168 hours. Recent Labs  Lab 09/27/18 1609  AMMONIA 39*   Coagulation Profile: No results for input(s): INR, PROTIME in the last 168 hours. Cardiac Enzymes: No results for input(s): CKTOTAL, CKMB, CKMBINDEX, TROPONINI in the last 168 hours. BNP (last 3 results) No results for input(s): PROBNP in the last 8760 hours. HbA1C: No results for input(s): HGBA1C in the last 72 hours. CBG: Recent Labs  Lab 09/27/18 1156 09/27/18 1655 09/27/18 2111 09/28/18 0741 09/28/18 1228  GLUCAP 132* 95 101* 122* 166*   Lipid Profile: No results for input(s): CHOL, HDL, LDLCALC, TRIG, CHOLHDL, LDLDIRECT in the last 72 hours. Thyroid Function Tests: No results for input(s): TSH, T4TOTAL, FREET4, T3FREE, THYROIDAB in the last 72 hours. Anemia Panel: No results for input(s): VITAMINB12, FOLATE, FERRITIN, TIBC, IRON, RETICCTPCT in the last 72 hours. Sepsis Labs: Recent Labs  Lab 09/25/18 1427 09/27/18 1609  LATICACIDVEN 2.2* 0.8    Recent Results (from the past 240 hour(s))  Culture, blood (Routine X 2) w Reflex to ID Panel     Status: Abnormal (Preliminary result)   Collection Time: 09/25/18  2:27 PM  Result Value Ref Range Status   Specimen Description   Final    BLOOD LEFT ARM Performed at [facility] [address], [place], [place] [address]    Special Requests   Final    BOTTLES DRAWN AEROBIC AND ANAEROBIC Blood Culture adequate volume   Culture  Setup Time   Final    GRAM NEGATIVE RODS IN BOTH AEROBIC AND ANAEROBIC BOTTLES CRITICAL VALUE NOTED.  VALUE IS CONSISTENT WITH PREVIOUSLY REPORTED AND CALLED VALUE.    Culture (A)  Final    ESCHERICHIA COLI SUSCEPTIBILITIES PERFORMED ON PREVIOUS CULTURE WITHIN THE LAST 5 DAYS. Performed at [facility], [place] [address], [place], [place] [address]    Report Status PENDING  Incomplete  Culture, blood (Routine X 2) w Reflex to ID Panel     Status: Abnormal   Collection Time: 09/25/18  2:29 PM  Result Value Ref Range Status   Specimen Description   Final    BLOOD LEFT HAND Performed at [facility] [address] [place], [place] [address]    Special Requests   Final    BOTTLES DRAWN AEROBIC ONLY Blood Culture adequate volume   Culture  Setup Time  Final    GRAM NEGATIVE RODS AEROBIC BOTTLE ONLY CRITICAL RESULT CALLED TO, READ BACK BY AND VERIFIED WITH: Jodelle Green GREEN [id number] MLM Performed at [facility], [place] [address] [place], [place] [address]    Culture ESCHERICHIA COLI (A)  Final   Report Status 09/28/2018 FINAL  Final   Organism ID, Bacteria ESCHERICHIA COLI  Final      Susceptibility   Escherichia coli - MIC*    AMPICILLIN 16 INTERMEDIATE Intermediate     CEFAZOLIN <=4 SENSITIVE Sensitive     CEFEPIME <=1 SENSITIVE Sensitive     CEFTAZIDIME <=1 SENSITIVE Sensitive     CEFTRIAXONE <=1 SENSITIVE Sensitive     CIPROFLOXACIN <=0.25 SENSITIVE Sensitive     GENTAMICIN <=1 SENSITIVE Sensitive     IMIPENEM <=0.25 SENSITIVE Sensitive     TRIMETH/SULFA <=20 SENSITIVE Sensitive     AMPICILLIN/SULBACTAM 8 SENSITIVE Sensitive     PIP/TAZO <=4 SENSITIVE Sensitive     Extended ESBL NEGATIVE Sensitive     * ESCHERICHIA COLI  Blood Culture ID Panel (Reflexed)     Status: Abnormal   Collection Time: 09/25/18  2:29 PM  Result Value Ref Range Status   Enterococcus species NOT DETECTED NOT DETECTED Final   Listeria monocytogenes NOT DETECTED NOT DETECTED Final   Staphylococcus species NOT DETECTED NOT DETECTED Final   Staphylococcus aureus NOT DETECTED NOT DETECTED Final   Streptococcus species NOT DETECTED NOT DETECTED Final   Streptococcus agalactiae NOT DETECTED NOT DETECTED Final   Streptococcus pneumoniae NOT DETECTED NOT DETECTED Final    Streptococcus pyogenes NOT DETECTED NOT DETECTED Final   Acinetobacter baumannii NOT DETECTED NOT DETECTED Final   Enterobacteriaceae species DETECTED (A) NOT DETECTED Final    Comment: Enterobacteriaceae represent a large family of gram-negative bacteria, not a single organism. CRITICAL RESULT CALLED TO, READ BACK BY AND VERIFIED WITH: PHARMD B GREEN [id number] MLM    Enterobacter cloacae complex NOT DETECTED NOT DETECTED Final   Escherichia coli DETECTED (A) NOT DETECTED Final    Comment: CRITICAL RESULT CALLED TO, READ BACK BY AND VERIFIED WITH: PHARMD B GREEN [id number] 1243 MLM    Klebsiella oxytoca NOT DETECTED NOT DETECTED Final   Klebsiella pneumoniae NOT DETECTED NOT DETECTED Final   Proteus species NOT DETECTED NOT DETECTED Final   Serratia marcescens NOT DETECTED NOT DETECTED Final   Carbapenem resistance NOT DETECTED NOT DETECTED Final   Haemophilus influenzae NOT DETECTED NOT DETECTED Final   Neisseria meningitidis NOT DETECTED NOT DETECTED Final   Pseudomonas aeruginosa NOT DETECTED NOT DETECTED Final   Candida albicans NOT DETECTED NOT DETECTED Final   Candida glabrata NOT DETECTED NOT DETECTED Final   Candida krusei NOT DETECTED NOT DETECTED Final   Candida parapsilosis NOT DETECTED NOT DETECTED Final   Candida tropicalis NOT DETECTED NOT DETECTED Final    Comment: Performed at [facility], [place]. [address], [place], [place] [address]  Culture, Urine     Status: None   Collection Time: 09/25/18  4:26 PM  Result Value Ref Range Status   Specimen Description   Final    URINE, CLEAN CATCH Performed at [facility], [place] [address] [place], [place] [address]    Special Requests   Final    NONE Performed at [facility], [place] [address] [place], [place] [address]    Culture   Final    Multiple bacterial morphotypes present, none predominant. Suggest appropriate recollection if clinically indicated.  Report Status 09/26/2018  FINAL  Final  Culture, blood (Routine X 2) w Reflex to ID Panel     Status: None (Preliminary result)   Collection Time: 09/27/18  1:25 PM  Result Value Ref Range Status   Specimen Description   Final    BLOOD LEFT HAND Performed at [facility] [address], [place], [place] [address]    Special Requests   Final    BOTTLES DRAWN AEROBIC ONLY Blood Culture adequate volume Performed at [facility] [address] [place], [place] [address]    Culture   Final    NO GROWTH < 24 HOURS Performed at [facility] [address] [place], [place] [address]    Report Status PENDING  Incomplete  Culture, blood (Routine X 2) w Reflex to ID Panel     Status: None (Preliminary result)   Collection Time: 09/27/18  1:26 PM  Result Value Ref Range Status   Specimen Description   Final    BLOOD LEFT HAND Performed at [facility] [address] [place], [place] [address]    Special Requests   Final    BOTTLES DRAWN AEROBIC ONLY Blood Culture adequate volume Performed at [facility] [address], [place], [place] [address]    Culture   Final    NO GROWTH < 24 HOURS Performed at [facility] [address], [place], [place] [address]    Report Status PENDING  Incomplete  MRSA PCR Screening     Status: None   Collection Time: 09/28/18  8:47 AM  Result Value Ref Range Status   MRSA by PCR NEGATIVE NEGATIVE Final    Comment:        The GeneXpert MRSA Assay (FDA approved for NASAL specimens only), is one component of a comprehensive MRSA colonization surveillance program. It is not intended to diagnose MRSA infection nor to guide or monitor treatment for MRSA infections. Performed at [facility], [place] [address] [place], [place] [address]          Radiology Studies: Ct Head Wo Contrast  Result Date: 09/27/2018 CLINICAL DATA:  Altered mental status EXAM: CT HEAD  WITHOUT CONTRAST TECHNIQUE: Contiguous axial images were obtained from the base of the skull through the vertex without intravenous contrast. COMPARISON:  None. FINDINGS: Brain: There is no mass, hemorrhage or extra-axial collection. The size and configuration of the ventricles and extra-axial CSF spaces are normal. There is no acute or chronic infarction. There is hypoattenuation of the periventricular white matter, most commonly indicating chronic ischemic microangiopathy. Vascular: No abnormal hyperdensity of the major intracranial arteries or dural venous sinuses. No intracranial atherosclerosis. Skull: The visualized skull base, calvarium and extracranial soft tissues are normal. Sinuses/Orbits: No fluid levels or advanced mucosal thickening of the visualized paranasal sinuses. No mastoid or middle ear effusion. The orbits are normal. IMPRESSION: Chronic small vessel disease without acute intracranial abnormality. Electronically Signed   By: Ulyses Jarred M.D.   On: 09/27/2018 20:27        Scheduled Meds: . apixaban  5 mg Oral BID  . chlorhexidine  15 mL Mouth Rinse BID  . feeding supplement  1 Container Oral BID BM  . feeding supplement (PRO-STAT SUGAR FREE 64)  30 mL Oral BID  . furosemide  20 mg Oral Daily  . hydrocortisone  10 mg Oral Daily  . insulin aspart  0-15 Units Subcutaneous TID WC  .  insulin aspart  0-5 Units Subcutaneous QHS  . latanoprost  1 drop Both Eyes QHS  . mouth rinse  15 mL Mouth Rinse q12n4p  . metoprolol tartrate  12.5 mg Oral BID  . pantoprazole  40 mg Oral BID AC  . sucralfate  1 g Oral TID WC & HS   Continuous Infusions: . sodium chloride    . cefTRIAXone (ROCEPHIN)  IV Stopped (09/28/18 1009)     LOS: 40 days    Time spent:  32 minutes.     Hosie Poisson, MD [facility] Pager [phone]   If 7PM-7AM, please contact night-coverage [email or web address] Password [facility] 09/28/2018, 2:33 PM

## 2018-09-28 NOTE — Progress Notes (Signed)
Physical Therapy Treatment Patient Details Name: [name] MRN: [id number] DOB: 1941-09-08 Today's Date: 09/28/2018    History of Present Illness Pt admitted with melena, GIB and hypotension.  Pt with hx of DM, Bil THR, chronic venous insufficiency, chronic osteomyelitis of lower leg, Pagets Disease, and R wrist fusion, patient has been  in and out of ICU/SDU multiple times for hypotension    PT Comments    Patient quite confused and  Commenting on events that have not happened. Patient remains difficulty to get into standing due to longevity of in hospital with medical complications and body habitus/ risk for legs buckling.  continue PT      Follow Up Recommendations  SNF;LTACH     Equipment Recommendations  None recommended by PT    Recommendations for Other Services       Precautions / Restrictions Precautions Precautions: Fall Precaution Comments: has hypotension Restrictions Weight Bearing Restrictions: No    Mobility  Bed Mobility Overal bed mobility: Needs Assistance Bed Mobility: Supine to Sit;Sit to Supine Rolling: Max assist;+2 for physical assistance;+2 for safety/equipment   Supine to sit: Mod assist Sit to supine: Total assist;+2 for physical assistance;+2 for safety/equipment   General bed mobility comments: assist with both legs and trunk with mod to sit up. total back into bed with legs and trunk.   Transfers                 General transfer comment: did not perform- would need to use lift  Ambulation/Gait                 Stairs             Wheelchair Mobility    Modified Rankin (Stroke Patients Only)       Balance Overall balance assessment: Needs assistance Sitting-balance support: Bilateral upper extremity supported;Feet supported Sitting balance-Leahy Scale: Fair Sitting balance - Comments: Pt relying on UE support to balance in sitting                                    Cognition  Arousal/Alertness: Awake/alert Behavior During Therapy: WFL for tasks assessed/performed Overall Cognitive Status: Impaired/Different from baseline Area of Impairment: Memory;Problem solving;Awareness;Safety/judgement                 Orientation Level: Disoriented to;Place;Time;Situation             General Comments: patient stating that he  was at church last night, a lady firend was here. Not making sensible statements. did recall name of college that his grandson is QB for in [place] and son stopped to visit from [place].      Exercises Shoulder Exercises Shoulder Flexion: AAROM;Both;5 reps Elbow Flexion: AAROM;Both;5 reps Elbow Extension: AAROM;Both;5 reps Wrist Flexion: Both;AROM;10 reps Wrist Extension: Both;AROM;10 reps;5 reps Digit Composite Flexion: AROM;Both;5 reps Composite Extension: AROM;Both;5 reps    General Comments        Pertinent Vitals/Pain Pain Assessment: No/denies pain Faces Pain Scale: No hurt    Home Living                      Prior Function            PT Goals (current goals can now be found in the care plan section) Progress towards PT goals: Not progressing toward goals - comment(patient is unable to stand due to habitus and  weakness)    Frequency    Min 2X/week      PT Plan Current plan remains appropriate    Co-evaluation PT/OT/SLP Co-Evaluation/Treatment: Yes Reason for Co-Treatment: Complexity of the patient's impairments (multi-system involvement) PT goals addressed during session: Mobility/safety with mobility OT goals addressed during session: ADL's and self-care      AM-PAC PT "6 Clicks" Daily Activity  Outcome Measure  Difficulty turning over in bed (including adjusting bedclothes, sheets and blankets)?: Unable Difficulty moving from lying on back to sitting on the side of the bed? : Unable Difficulty sitting down on and standing up from a chair with arms (e.g., wheelchair, bedside commode,  etc,.)?: Unable Help needed moving to and from a bed to chair (including a wheelchair)?: Total Help needed walking in hospital room?: Total Help needed climbing 3-5 steps with a railing? : Total 6 Click Score: 6    End of Session Equipment Utilized During Treatment: Oxygen Activity Tolerance: Patient tolerated treatment well;Other (comment)(limited by  weakness and prolonged hospitalization) Patient left: in bed;with nursing/sitter in room;with bed alarm set Nurse Communication: Mobility status PT Visit Diagnosis: Muscle weakness (generalized) (M62.81)     Time: 7673-4193 PT Time Calculation (min) (ACUTE ONLY): 26 min  Charges:  $Therapeutic Activity: 8-22 mins                     Tresa Endo PT Acute Rehabilitation Services Pager [phone] Office [phone]    Claretha Cooper 09/28/2018, 1:18 PM

## 2018-09-28 NOTE — Care Management (Addendum)
09-28-18 BENEFITS CHECK:  # 7.  S/W   KARLA   @ [employer]  ADV  # [phone]  DOSES PATIENT HAVE LTACH BENEFITS  COVER- YES DAYS 1-20- $ 20.00 CO-PAY DAYS  21-100 -$ 160.00  CO-PAY  PRIOR APPROVAL-  YES GO LINE TO [employer] FOR PROVIDER  FOR  AUTH FORM  PREFERRED PROVIDER: [place]

## 2018-09-28 NOTE — Progress Notes (Signed)
Patient repeatedly taking his bipap off and separating into multiple pieces. Placed on nasal cannula at this time after pt refusing any further bipap use.

## 2018-09-28 NOTE — Progress Notes (Signed)
Occupational Therapy Treatment Patient Details Name: [name] MRN: [id number] DOB: 1941/03/14 Today's Date: 09/28/2018    History of present illness Pt admitted with melena, GIB and hypotension.  Pt with hx of DM, Bil THR, chronic venous insufficiency, chronic osteomyelitis of lower leg, Pagets Disease, and R wrist fusion   OT comments  Pt sat EOB with OT/PT . Pts sitting balance good sitting EOB.   Follow Up Recommendations  SNF    Equipment Recommendations  Other (comment)(TBD at next venue of care)    Recommendations for Other Services      Precautions / Restrictions Precautions Precautions: Fall Restrictions Weight Bearing Restrictions: No       Mobility Bed Mobility Overal bed mobility: Needs Assistance Bed Mobility: Rolling;Supine to Sit;Sit to Supine Rolling: Max assist;+2 for physical assistance;+2 for safety/equipment   Supine to sit: +2 for physical assistance;HOB elevated;Max assist Sit to supine: Max assist;+2 for physical assistance;+2 for safety/equipment      Transfers                 General transfer comment: did not perform- would need to use lift        ADL either performed or assessed with clinical judgement   ADL Overall ADL's : Needs assistance/impaired Eating/Feeding: Minimal assistance   Grooming: Moderate assistance;Sitting                                                 Cognition Arousal/Alertness: Awake/alert Behavior During Therapy: WFL for tasks assessed/performed Overall Cognitive Status: Impaired/Different from baseline Area of Impairment: Memory;Problem solving;Awareness;Safety/judgement                 Orientation Level: Disoriented to;Place;Time;Situation                      Exercises Shoulder Exercises Shoulder Flexion: AAROM;Both;5 reps Elbow Flexion: AAROM;Both;5 reps Elbow Extension: AAROM;Both;5 reps Wrist Flexion: Both;AROM;10 reps Wrist Extension: Both;AROM;10  reps;5 reps Digit Composite Flexion: AROM;Both;5 reps Composite Extension: AROM;Both;5 reps           Pertinent Vitals/ Pain       Pain Assessment: No/denies pain         Frequency  Min 2X/week        Progress Toward Goals  OT Goals(current goals can now be found in the care plan section)        Plan Discharge plan remains appropriate    Co-evaluation    PT/OT/SLP Co-Evaluation/Treatment: Yes   PT goals addressed during session: Mobility/safety with mobility OT goals addressed during session: ADL's and self-care      AM-PAC PT "6 Clicks" Daily Activity     Outcome Measure   Help from another person eating meals?: None Help from another person taking care of personal grooming?: A Little Help from another person toileting, which includes using toliet, bedpan, or urinal?: Total Help from another person bathing (including washing, rinsing, drying)?: Total Help from another person to put on and taking off regular upper body clothing?: Total Help from another person to put on and taking off regular lower body clothing?: Total 6 Click Score: 11    End of Session Equipment Utilized During Treatment: Oxygen  OT Visit Diagnosis: Muscle weakness (generalized) (M62.81)   Activity Tolerance Patient tolerated treatment well   Patient Left in bed;with call bell/phone within reach  Nurse Communication Mobility status        Time: 0761-5183 OT Time Calculation (min): 21 min  Charges: OT General Charges $OT Visit: 1 Visit OT Treatments $Therapeutic Activity: 8-22 mins  Kari Baars, [place] [phone] Office- [phone]      Philicia Heyne, Edwena Felty D 09/28/2018, 11:22 AM

## 2018-09-28 NOTE — Care Management Note (Signed)
Case Management Note  Patient Details  Name: [name] MRN: [id number] Date of Birth: [birth year]  Subjective/Objective:                  placement  Action/Plan: Have asked both ltachs to review patient for possible admission.  Expected Discharge Date:  08/22/18               Expected Discharge Plan:  Skilled Nursing Facility  In-House Referral:  Clinical Social Work  Discharge planning Services  CM Consult  Post Acute Care Choice:  NA Choice offered to:  NA  DME Arranged:  N/A DME Agency:  NA  HH Arranged:  NA HH Agency:  NA  Status of Service:  Completed, signed off  If discussed at [employer] of Stay Meetings, dates discussed:    Additional Comments:  Leeroy Cha, RN 09/28/2018, 2:05 PM

## 2018-09-28 NOTE — Progress Notes (Addendum)
CSW reached out to the patient daughter, she is actively working with DSS to provide documents for the [facility] application.   Patient will need the medicaid application completed before SNF placement can be found.    CSW possible LTACH placement with case manager. The  CM will inquire if LTACH will consider.   CSW will continue to follow for placement needs.    Kathrin Greathouse, Marlinda Mike, MSW Clinical Social Worker  [phone] 09/28/2018  2:55 PM

## 2018-09-28 NOTE — Care Management (Signed)
[id number] [name],BSN,RN3,CCM/[phone] PATIENT meet ltach criteria for admission/checking benefits for coverage.

## 2018-09-29 LAB — BASIC METABOLIC PANEL
Anion gap: 9 (ref 5–15)
BUN: 16 mg/dL (ref 8–23)
CHLORIDE: 103 mmol/L (ref 98–111)
CO2: 37 mmol/L — ABNORMAL HIGH (ref 22–32)
Calcium: 8.4 mg/dL — ABNORMAL LOW (ref 8.9–10.3)
Creatinine, Ser: 0.75 mg/dL (ref 0.61–1.24)
GFR calc non Af Amer: >60 mL/min (ref >60)
GLUCOSE: 125 mg/dL — AB (ref 70–99)
Potassium: 3.1 mmol/L — ABNORMAL LOW (ref 3.5–5.1)
Sodium: 149 mmol/L — ABNORMAL HIGH (ref 135–145)

## 2018-09-29 LAB — CULTURE, BLOOD (ROUTINE X 2): SPECIAL REQUESTS: ADEQUATE

## 2018-09-29 LAB — GLUCOSE, CAPILLARY
GLUCOSE-CAPILLARY: 98 mg/dL (ref 70–99)
GLUCOSE-CAPILLARY: 232 mg/dL — AB (ref 70–99)
GLUCOSE-CAPILLARY: 75 mg/dL (ref 70–99)
Glucose-Capillary: 201 mg/dL — ABNORMAL HIGH (ref 70–99)

## 2018-09-29 MED ORDER — INSULIN ASPART 100 UNIT/ML ~~LOC~~ SOLN: SUBCUTANEOUS | 11 refills | Status: AC

## 2018-09-29 MED ORDER — PANTOPRAZOLE SODIUM 40 MG PO TBEC: 40.0000 mg | DELAYED_RELEASE_TABLET | Freq: Two times a day (BID) | ORAL | Status: AC

## 2018-09-29 MED ORDER — FUROSEMIDE 40 MG PO TABS
40.0000 mg | ORAL_TABLET | Freq: Every day | ORAL | Status: DC
  Administered 2018-09-30 – 2018-10-01 (×2): 40 mg via ORAL
  Filled 2018-09-29 (×2): qty 1

## 2018-09-29 MED ORDER — SUCRALFATE 1 G PO TABS: 1.0000 g | ORAL_TABLET | Freq: Three times a day (TID) | ORAL | Status: AC

## 2018-09-29 MED ORDER — FUROSEMIDE 40 MG PO TABS: 40.0000 mg | ORAL_TABLET | Freq: Every day | ORAL | Status: AC

## 2018-09-29 MED ORDER — POTASSIUM CHLORIDE CRYS ER 20 MEQ PO TBCR
40.0000 meq | EXTENDED_RELEASE_TABLET | Freq: Once | ORAL | Status: AC
  Administered 2018-09-29: 40 meq via ORAL
  Filled 2018-09-29: qty 2

## 2018-09-29 MED ORDER — SODIUM CHLORIDE 0.9 % IV SOLN: 2.0000 g | Freq: Every day | INTRAVENOUS | 0 refills | Status: AC

## 2018-09-29 MED ORDER — HYDRALAZINE HCL 20 MG/ML IJ SOLN
10.0000 mg | Freq: Four times a day (QID) | INTRAMUSCULAR | Status: DC | PRN
  Administered 2018-09-29 – 2018-10-01 (×3): 10 mg via INTRAVENOUS
  Filled 2018-09-29 (×4): qty 1

## 2018-09-29 MED ORDER — APIXABAN 5 MG PO TABS: 5.0000 mg | ORAL_TABLET | Freq: Two times a day (BID) | ORAL | Status: AC

## 2018-09-29 MED ORDER — HYDROCORTISONE 10 MG PO TABS: 10.0000 mg | ORAL_TABLET | Freq: Every day | ORAL | Status: DC

## 2018-09-29 MED ORDER — PRO-STAT SUGAR FREE PO LIQD: 30.0000 mL | Freq: Two times a day (BID) | ORAL | 0 refills | Status: AC

## 2018-09-29 NOTE — Discharge Summary (Addendum)
Discharge summary created by Dr. Karleen Hampshire. Documentation reviewed and discharge date has been changed, no other changes were made to the below documentation. Agree with current plan.  Joel Oman, MD    Physician Discharge Summary  [name] UKG:[id number] DOB: [birth year] DOA: 08/19/2018  PCP: [place] Bing, DO  Admit date: 08/19/2018 Discharge date: 10/01/2018  Admitted From: Home.  Disposition:   Home.   Recommendations for Outpatient Follow-up:  1. Follow up with PCP in 1-2 weeks 2. Please obtain BMP in 1 to 2 days to check potassium and sodium.    Discharge Condition: guarded.  CODE STATUS: PARTIAL CODE, No CPR, no intubation, can use BiPAP, administer ACLS meds, cardiovert if needed Diet recommendation: Heart Healthy    Brief/Interim Summary: [name] a 77 y/o with PMH of DM with neuropathy, chronic venous insufficiency, chronic osteomyelitis, chronic systolic and diastolic CHF (EF 76-28%), ischemic cardiomyopathy, HTN, Prostate CA who was being treated for c diff colitis as outpt and admitted for melena, hypotension, AKI with a Hb of 6.7 and a BUN/Cr of 111/2.1.  7/30-8/8>>He had a hospitalization for sepsis (urine/ pneumonia/ soft tissue) &PEA cardiac arrest suspected to be from a CHF exacerbation with anew drop in EF to 35%.   After admission on 8/21, he was stabilized with blood transfusions and his bleeding resolved. He was going to be discharged on 8/24 but then had a large BM with drop in Hb. EGD 8/29 > large duodenal bulb ulcer with the large visible vessel and evidence of recent bleeding- treated with epinephrine injection and gold probe cautery. Patient had oozing of blood after gold probe cautery and subsequently Hemo spray was used with achievement of hemostasis. 8/30 - RUE PICC placed 9/4 he was found to have a PE and started on Heparin infusion. LE venous duplex negative on  9/6 palliative care consulted 9/7 developed hematemesis, hypotension and  transferred to ICU for pressors- anticoagulation reversed and transfused PRBC- embolized by IR 9/9 - pressors stopped- transferred out of ICU  9/10 repeat EGD show no further bleeding 9/13 transferred back to SDU for acute resp failure related to fluid overloaded-  9/16 found to have RUE DVT related to PICC- Heparin resumed without bolus. 9/17 cardiology consulted for new systolic CHF evaluation, noted to have abnormal EKG (anterolateral T wave changes), Troponin 0.06- determined to not be a candidate for further intervention due to inability to adequately anticoagulate if stenting needed. Cardiology signed off. 9/18 Levophed tapered off  9/19 transferred to [facility] 9/26  SW Assisting for placement at [facility].     Discharge Diagnoses:  Principal Problem:   Systolic CHF, acute on chronic [facility]) Active Problems:   Morbid obesity with body mass index of 50.0-59.9 in adult [facility])   Essential hypertension   Encounter for central line placement   Impaired mobility   Fluid overload   Chronic venous insufficiency, Bilateral Legs   SOB (shortness of breath)   Gastrointestinal hemorrhage   Pressure injury of skin   Hemorrhagic shock (HCC)   Malnutrition of moderate degree   Anasarca   Acute pulmonary embolism (HCC)   Acute respiratory failure (HCC)   Duodenal ulcer   Abdominal pain   Leukocytosis   Elevated alkaline phosphatase measurement  Hypovolemic shock:  Secondary to GI bleed from duodenal ulcer. Patient had upper endoscopy on September 10.  And his Eliquis was restarted.  His hemoglobin appears to be stable between 8-9 range. Repeat H&H is around 8/ 27.2.  No signs of bleeding.   Resume BID PPI.    Acute on chronic hypercarbic respiratory failure/acute pulmonary embolism Appears to have resolved.  Resume CPAP at night. Pt is non compliant to CPAP at night. He keeps refusing CPAP.    Acute kidney injury : resolved. Creatinine has been stable  between 0.7-1.1.  Morbid obesity:  Mostly pt is bed bound.   Hypertension : resolved.   Adrenal insufficiency currently on Cortef   Chronic venous insufficiency Unna boots. Wound care recommendations .   Nutrition of moderate degree continue Protostat and boost.  Hypokalemia: replaced. Repeat BMP in am.   Fever: ? Ecoli bacteremia:  UA abnormal, and urine cultures are negative.  CXR ordered  Showed Cardiomegaly with diffuse bilateral pulmonary infiltrates and bilateral pleural effusions most consistent with CHF. Bilateral pneumonia cannot be excluded. Restarted him on low dose lasix.  Blood cultures drawn and grew e coli. He was started on rocephin, plan for 7 days of treatment  Repeat blood cultures negative so far.    Mild acute CHF:  - started him back on low dose lasix, transition him to home dose of lasix on discharge.   Acute encephalopathy probably metabolic from respiratory acidosis and hypernatremia - pt back to baseline after BIPAP use, .  - CT head negative.    Acute respiratory failure with hypercapnia and hypoxia Pt hypercarbic, evident on ABG had to be transferred to stepdown for BIPAP. He tolerated the BIPAP and his mentation, and respiratory issues improved. Recommend strict CPAP use every night.  Currently on 3 lit of [place] oxygen during the day..    Hypernatremia:  - poor po intake, encourage oral free water intake.  - repeat in am.    Discharge Instructions  Discharge Instructions    Call MD for:  persistant dizziness or light-headedness   Complete by:  As directed    Call MD for:  persistant nausea and vomiting   Complete by:  As directed    Call MD for:  redness, tenderness, or signs of infection (pain, swelling, redness, odor or green/yellow discharge around incision site)   Complete by:  As directed    Diet - low sodium heart healthy   Complete by:  As directed    Increase activity slowly   Complete by:  As directed       Allergies as of 09/29/2018      Reactions   Ace Inhibitors Cough   With Lisinopril.   Angiotensin Receptor Blockers Other (See Comments)   Caused excessive weight gain   Metformin Diarrhea      Medication List    STOP taking these medications   aspirin 81 MG chewable tablet   FIRVANQ 50 MG/ML Solr Generic drug:  Vancomycin HCl   Gerhardt's butt cream Crea   losartan 25 MG tablet Commonly known as:  COZAAR   mometasone 50 MCG/ACT nasal spray Commonly known as:  NASONEX   torsemide 20 MG tablet Commonly known as:  DEMADEX   traMADol 50 MG tablet Commonly known as:  ULTRAM     TAKE these medications   apixaban 5 MG Tabs tablet Commonly known as:  ELIQUIS Take 1 tablet (5 mg total) by mouth 2 (two) times daily.   cefTRIAXone 2 g in sodium chloride 0.9 % 100 mL Inject 2 g into the vein daily for 2 days. Start taking on:  09/30/2018   DERMACLOUD Crea Apply 1 application topically 3 (three) times daily.   famotidine 20 MG tablet Commonly  known as:  PEPCID Take 2 tablets (40 mg total) by mouth daily.   feeding supplement (PRO-STAT SUGAR FREE 64) Liqd Take 30 mLs by mouth 2 (two) times daily.   ferrous sulfate 325 (65 FE) MG tablet Take 1 tablet (325 mg total) by mouth 2 (two) times daily with a meal.   FLONASE 50 MCG/ACT nasal spray Generic drug:  fluticasone Place 2 sprays into both nostrils daily.   FLOVENT HFA 220 MCG/ACT inhaler Generic drug:  fluticasone INHALE 1 PUFF INTO THE LUNGS TWICE DAILY What changed:  See the new instructions.   furosemide 40 MG tablet Commonly known as:  LASIX Take 1 tablet (40 mg total) by mouth daily. Start taking on:  09/30/2018   hydrocortisone 10 MG tablet Commonly known as:  CORTEF Take 1 tablet (10 mg total) by mouth daily. Start taking on:  09/30/2018   insulin aspart 100 UNIT/ML injection Commonly known as:  novoLOG CBG 70 - 120: 0 units CBG 121 - 150: 2 units CBG 151 - 200: 3 units CBG 201 - 250: 5  units CBG 251 - 300: 8 units CBG 301 - 350: 11 units CBG 351 - 400: 15 units   latanoprost 0.005 % ophthalmic solution Commonly known as:  XALATAN INSTILL 1 DROP IN BOTH EYES EVERY EVENING FOR GLAUCOMA What changed:  See the new instructions.   metoprolol tartrate 25 MG tablet Commonly known as:  LOPRESSOR Take 0.5 tablets (12.5 mg total) by mouth 2 (two) times daily.   nystatin powder Commonly known as:  MYCOSTATIN/NYSTOP Apply topically 2 (two) times daily.   pantoprazole 40 MG tablet Commonly known as:  PROTONIX Take 1 tablet (40 mg total) by mouth 2 (two) times daily before a meal.   pravastatin 40 MG tablet Commonly known as:  PRAVACHOL TAKE 1 TABLET BY MOUTH DAILY   PROAIR HFA 108 (90 Base) MCG/ACT inhaler Generic drug:  albuterol INHALE 2 PUFFS BY MOUTH EVERY 4 HOURS AS NEEDED FOR WHEEZING OR SHORTNESS OF BREATH What changed:  See the new instructions.   PROBIOTIC PO Take 2 capsules by mouth daily.   SANTYL ointment Generic drug:  collagenase Apply 1 application topically daily.   sucralfate 1 g tablet Commonly known as:  CARAFATE Take 1 tablet (1 g total) by mouth 4 (four) times daily -  with meals and at bedtime.   tamsulosin 0.4 MG Caps capsule Commonly known as:  FLOMAX TAKE 2 CAPSULES BY MOUTH EVERY MORNING What changed:  when to take this   urea 10 % lotion Apply topically 2 (two) times daily.      Follow-up Information    G[place] Bing, DO Follow up.   Specialty:  Family Medicine Contact information: [address] [place] [address] [phone]          Allergies  Allergen Reactions  . Ace Inhibitors Cough    With Lisinopril.  . Angiotensin Receptor Blockers Other (See Comments)    Caused excessive weight gain  . Metformin Diarrhea    Consultations:  Gastroenterology  IR  Palliative care  Cardiology     Procedures/Studies: Dg Chest 1 View  Result Date: 09/01/2018 CLINICAL DATA:  Shortness of breath.  Diabetes and hypertension. Personal history of prostate carcinoma. EXAM: CHEST  1 VIEW COMPARISON:  08/29/2018 FINDINGS: Right arm PICC line remains in appropriate position. Heart size is stable. Mild elevation of left hemidiaphragm again noted. There is persistent atelectasis or infiltrate in the left lung base. Mild increase in atelectasis  noted in right lung base. No pneumothorax visualized. IMPRESSION: Left lower lobe atelectasis versus infiltrate, without significant change. Increased mild right basilar atelectasis. Electronically Signed   By: Earle Gell M.D.   On: 09/01/2018 08:40   Dg Abd 1 View  Result Date: 09/08/2018 CLINICAL DATA:  NG placement EXAM: ABDOMEN - 1 VIEW COMPARISON:  09/08/2018 FINDINGS: NG has been removed from the right lower lobe and now is in the stomach. NG tip in the gastric fundus. Bibasilar atelectasis/infiltrate.  Small left effusion. Normal bowel gas pattern. Embolization coils in the region of the right gastroduodenal artery. IMPRESSION: NG tube in the gastric fundus.  Normal bowel gas pattern. Electronically Signed   By: Franchot Gallo M.D.   On: 09/08/2018 09:30   Dg Abd 1 View  Result Date: 09/08/2018 CLINICAL DATA:  NG tube placement. EXAM: ABDOMEN - 1 VIEW COMPARISON:  Radiographs yesterday. FINDINGS: An enteric tube courses over the right lower chest with tip in the region of the right lower lobe bronchus in side-port in the region of the right mainstem bronchus. Recommend removal and replacement. Previous gastric tube in the stomach is no longer seen. IMPRESSION: Enteric tube projecting over the right lower chest suspicious for bronchial placement. Recommend removal and replacement. These results will be called to the ordering clinician or representative by the Radiologist Assistant, and communication documented in the PACS or zVision Dashboard. Electronically Signed   By: Keith Rake M.D.   On: 09/08/2018 04:12   Dg Abd 1 View  Result Date:  09/07/2018 CLINICAL DATA:  NG tube placement. EXAM: ABDOMEN - 1 VIEW COMPARISON:  09/05/2018 FINDINGS: Nonobstructive bowel gas pattern. Enteric catheter tip overlies the expected location of the gastric antrum. Embolization coils noted in the right upper quadrant of the abdomen. IMPRESSION: Enteric catheter tip at the expected location of the gastric antrum. Electronically Signed   By: Fidela Salisbury M.D.   On: 09/07/2018 09:30   Dg Abd 1 View  Result Date: 09/05/2018 CLINICAL DATA:  Enteric tube placement. EXAM: ABDOMEN - 1 VIEW COMPARISON:  Abdominal x-ray dated August 30, 2018. FINDINGS: Enteric tube tip in the region of the proximal duodenum. Nonobstructive bowel gas pattern. Interval coil embolization of the gastroduodenal artery. No radiopaque calculi or other significant abnormality. No acute osseous abnormality. Unchanged left basilar airspace disease and small left pleural effusion. IMPRESSION: 1. Enteric tube tip in the region of the proximal duodenum. 2. Unchanged left basilar airspace disease and small left pleural effusion. Electronically Signed   By: Titus Dubin M.D.   On: 09/05/2018 09:25   Ct Head Wo Contrast  Result Date: 09/27/2018 CLINICAL DATA:  Altered mental status EXAM: CT HEAD WITHOUT CONTRAST TECHNIQUE: Contiguous axial images were obtained from the base of the skull through the vertex without intravenous contrast. COMPARISON:  None. FINDINGS: Brain: There is no mass, hemorrhage or extra-axial collection. The size and configuration of the ventricles and extra-axial CSF spaces are normal. There is no acute or chronic infarction. There is hypoattenuation of the periventricular white matter, most commonly indicating chronic ischemic microangiopathy. Vascular: No abnormal hyperdensity of the major intracranial arteries or dural venous sinuses. No intracranial atherosclerosis. Skull: The visualized skull base, calvarium and extracranial soft tissues are normal. Sinuses/Orbits:  No fluid levels or advanced mucosal thickening of the visualized paranasal sinuses. No mastoid or middle ear effusion. The orbits are normal. IMPRESSION: Chronic small vessel disease without acute intracranial abnormality. Electronically Signed   By: Ulyses Jarred M.D.   On: 09/27/2018  20:27   Ct Angio Chest Pe W Or Wo Contrast  Result Date: 09/02/2018 CLINICAL DATA:  Shortness of breath.  Hypoxia. EXAM: CT ANGIOGRAPHY CHEST WITH CONTRAST TECHNIQUE: Multidetector CT imaging of the chest was performed using the standard protocol during bolus administration of intravenous contrast. Multiplanar CT image reconstructions and MIPs were obtained to evaluate the vascular anatomy. CONTRAST:  126mL ISOVUE-370 IOPAMIDOL (ISOVUE-370) INJECTION 76% COMPARISON:  None. FINDINGS: Cardiovascular: Satisfactory opacification of the pulmonary arteries to the segmental level. Pulmonary emboli in the segmental branches of the left upper lobe. Normal heart size. No pericardial effusion. Ascending thoracic aorta measures 4.1 cm in transverse diameter. Mediastinum/Nodes: No enlarged mediastinal, hilar, or axillary lymph nodes. Thyroid gland, trachea, and esophagus demonstrate no significant findings. Lungs/Pleura: Trace left pleural effusion. Left basilar atelectasis. Mild right basilar atelectasis. No pneumothorax. Upper Abdomen: No acute upper abdominal abnormality. Musculoskeletal: No acute osseous abnormality. No aggressive osseous lesion. Review of the MIP images confirms the above findings. IMPRESSION: 1. Pulmonary emboli in the segmental branches of the left upper lobe. 2. Bibasilar atelectasis, left greater than right. Critical Value/emergent results were called by telephone at the time of interpretation on 09/02/2018 at 12:21 pm to Dr. Doreatha Lew , who verbally acknowledged these results. Electronically Signed   By: Kathreen Devoid   On: 09/02/2018 12:27   Ir Angiogram Visceral Selective  Result Date:  09/05/2018 INDICATION: 77 year old male with a history of acute, life-threatening recurrent upper GI bleeding. EXAM: ULTRASOUND GUIDED ACCESS RIGHT COMMON FEMORAL ARTERY MESENTERIC ANGIOGRAM COIL EMBOLIZATION GASTRODUODENAL ARTERY DEPLOYMENT OF EXOSEAL FOR HEMOSTASIS MEDICATIONS: None ANESTHESIA/SEDATION: Moderate (conscious) sedation was employed during this procedure. A total of Versed 1.0 mg and Fentanyl 50 mcg was administered intravenously. Moderate Sedation Time: 30 minutes. The patient's level of consciousness and vital signs were monitored continuously by radiology nursing throughout the procedure under my direct supervision. CONTRAST:  15mL OMNIPAQUE IOHEXOL 300 MG/ML SOLN, 71mL OMNIPAQUE IOHEXOL 300 MG/ML SOLN FLUOROSCOPY TIME:  Fluoroscopy Time: 8 minutes 6 seconds (1,192 mGy). COMPLICATIONS: None PROCEDURE: Informed consent was obtained from the patient following explanation of the procedure, risks, benefits and alternatives. The patient understands, agrees and consents for the procedure. All questions were addressed. A time out was performed prior to the initiation of the procedure. Maximal barrier sterile technique utilized including caps, mask, sterile gowns, sterile gloves, large sterile drape, hand hygiene, and Betadine prep. Ultrasound survey of the right inguinal region was performed with images stored and sent to PACs, confirming patency of the vessel. A micropuncture needle was used access the right common femoral artery under ultrasound. With excellent arterial blood flow returned, and an .018 micro wire was passed through the needle, observed enter the abdominal aorta under fluoroscopy. The needle was removed, and a micropuncture sheath was placed over the wire. The inner dilator and wire were removed, and an 035 Bentson wire was advanced under fluoroscopy into the abdominal aorta. The sheath was removed and a standard 5 [place] vascular sheath was placed. The dilator was removed and the sheath  was flushed. Cobra catheter was advanced over the Bentson wire into the abdominal aorta. Catheter was used to select the celiac artery. Angiogram was performed. Catheter was withdrawn to the superior mesenteric artery. Angiogram was performed. Catheter was then used to select the celiac artery and a Glidewire was used to place the base catheter into the common hepatic artery, just at the origin of the gastroduodenal artery. Angiogram was performed. Microcatheter was then advanced into the gastroduodenal artery.  Angiogram was performed. Empiric coil embolization was then performed within the gastroduodenal artery, extending to the origin of the GDA. Repeat angiogram was performed. Catheters and wires were removed, and an Exoseal was deployed at the right common femoral artery access for hemostasis. Patient tolerated the procedure well. Hemodynamics were unchanged during the course of the procedure, as the patient remained on pressor support. No complications encountered. No significant blood loss. IMPRESSION: Status post ultrasound guided access right common femoral artery for mesenteric angiogram and empiric coil embolization of the gastroduodenal artery for acute life-threatening upper GI hemorrhage. Signed, Dulcy Fanny. Dellia Nims, RPVI [facility] Radiology Electronically Signed   By: Corrie Mckusick D.O.   On: 09/05/2018 08:13   Ir Angiogram Visceral Selective  Result Date: 09/05/2018 INDICATION: 77 year old male with a history of acute, life-threatening recurrent upper GI bleeding. EXAM: ULTRASOUND GUIDED ACCESS RIGHT COMMON FEMORAL ARTERY MESENTERIC ANGIOGRAM COIL EMBOLIZATION GASTRODUODENAL ARTERY DEPLOYMENT OF EXOSEAL FOR HEMOSTASIS MEDICATIONS: None ANESTHESIA/SEDATION: Moderate (conscious) sedation was employed during this procedure. A total of Versed 1.0 mg and Fentanyl 50 mcg was administered intravenously. Moderate Sedation Time: 30 minutes. The patient's  level of consciousness and vital signs were monitored continuously by radiology nursing throughout the procedure under my direct supervision. CONTRAST:  73mL OMNIPAQUE IOHEXOL 300 MG/ML SOLN, 61mL OMNIPAQUE IOHEXOL 300 MG/ML SOLN FLUOROSCOPY TIME:  Fluoroscopy Time: 8 minutes 6 seconds (1,192 mGy). COMPLICATIONS: None PROCEDURE: Informed consent was obtained from the patient following explanation of the procedure, risks, benefits and alternatives. The patient understands, agrees and consents for the procedure. All questions were addressed. A time out was performed prior to the initiation of the procedure. Maximal barrier sterile technique utilized including caps, mask, sterile gowns, sterile gloves, large sterile drape, hand hygiene, and Betadine prep. Ultrasound survey of the right inguinal region was performed with images stored and sent to PACs, confirming patency of the vessel. A micropuncture needle was used access the right common femoral artery under ultrasound. With excellent arterial blood flow returned, and an .018 micro wire was passed through the needle, observed enter the abdominal aorta under fluoroscopy. The needle was removed, and a micropuncture sheath was placed over the wire. The inner dilator and wire were removed, and an 035 Bentson wire was advanced under fluoroscopy into the abdominal aorta. The sheath was removed and a standard 5 [place] vascular sheath was placed. The dilator was removed and the sheath was flushed. Cobra catheter was advanced over the Bentson wire into the abdominal aorta. Catheter was used to select the celiac artery. Angiogram was performed. Catheter was withdrawn to the superior mesenteric artery. Angiogram was performed. Catheter was then used to select the celiac artery and a Glidewire was used to place the base catheter into the common hepatic artery, just at the origin of the gastroduodenal artery. Angiogram was performed. Microcatheter was then advanced into the  gastroduodenal artery. Angiogram was performed. Empiric coil embolization was then performed within the gastroduodenal artery, extending to the origin of the GDA. Repeat angiogram was performed. Catheters and wires were removed, and an Exoseal was deployed at the right common femoral artery access for hemostasis. Patient tolerated the procedure well. Hemodynamics were unchanged during the course of the procedure, as the patient remained on pressor support. No complications encountered. No significant blood loss. IMPRESSION: Status post ultrasound guided access right common femoral artery for mesenteric angiogram and empiric coil embolization of the gastroduodenal artery for acute life-threatening upper GI hemorrhage. Signed, Dulcy Fanny. Earleen Newport, DO,  RPVI [facility] Radiology Electronically Signed   By: Corrie Mckusick D.O.   On: 09/05/2018 08:13   Ir Angiogram Selective Each Additional Vessel  Result Date: 09/05/2018 INDICATION: 77 year old male with a history of acute, life-threatening recurrent upper GI bleeding. EXAM: ULTRASOUND GUIDED ACCESS RIGHT COMMON FEMORAL ARTERY MESENTERIC ANGIOGRAM COIL EMBOLIZATION GASTRODUODENAL ARTERY DEPLOYMENT OF EXOSEAL FOR HEMOSTASIS MEDICATIONS: None ANESTHESIA/SEDATION: Moderate (conscious) sedation was employed during this procedure. A total of Versed 1.0 mg and Fentanyl 50 mcg was administered intravenously. Moderate Sedation Time: 30 minutes. The patient's level of consciousness and vital signs were monitored continuously by radiology nursing throughout the procedure under my direct supervision. CONTRAST:  79mL OMNIPAQUE IOHEXOL 300 MG/ML SOLN, 58mL OMNIPAQUE IOHEXOL 300 MG/ML SOLN FLUOROSCOPY TIME:  Fluoroscopy Time: 8 minutes 6 seconds (1,192 mGy). COMPLICATIONS: None PROCEDURE: Informed consent was obtained from the patient following explanation of the procedure, risks, benefits and alternatives. The patient understands,  agrees and consents for the procedure. All questions were addressed. A time out was performed prior to the initiation of the procedure. Maximal barrier sterile technique utilized including caps, mask, sterile gowns, sterile gloves, large sterile drape, hand hygiene, and Betadine prep. Ultrasound survey of the right inguinal region was performed with images stored and sent to PACs, confirming patency of the vessel. A micropuncture needle was used access the right common femoral artery under ultrasound. With excellent arterial blood flow returned, and an .018 micro wire was passed through the needle, observed enter the abdominal aorta under fluoroscopy. The needle was removed, and a micropuncture sheath was placed over the wire. The inner dilator and wire were removed, and an 035 Bentson wire was advanced under fluoroscopy into the abdominal aorta. The sheath was removed and a standard 5 [place] vascular sheath was placed. The dilator was removed and the sheath was flushed. Cobra catheter was advanced over the Bentson wire into the abdominal aorta. Catheter was used to select the celiac artery. Angiogram was performed. Catheter was withdrawn to the superior mesenteric artery. Angiogram was performed. Catheter was then used to select the celiac artery and a Glidewire was used to place the base catheter into the common hepatic artery, just at the origin of the gastroduodenal artery. Angiogram was performed. Microcatheter was then advanced into the gastroduodenal artery. Angiogram was performed. Empiric coil embolization was then performed within the gastroduodenal artery, extending to the origin of the GDA. Repeat angiogram was performed. Catheters and wires were removed, and an Exoseal was deployed at the right common femoral artery access for hemostasis. Patient tolerated the procedure well. Hemodynamics were unchanged during the course of the procedure, as the patient remained on pressor support. No complications  encountered. No significant blood loss. IMPRESSION: Status post ultrasound guided access right common femoral artery for mesenteric angiogram and empiric coil embolization of the gastroduodenal artery for acute life-threatening upper GI hemorrhage. Signed, Dulcy Fanny. Dellia Nims, RPVI Vascular and Interventional Radiology Specialists [facility] Radiology Electronically Signed   By: Corrie Mckusick D.O.   On: 09/05/2018 08:13   Ir Angiogram Selective Each Additional Vessel  Result Date: 09/05/2018 INDICATION: 77 year old male with a history of acute, life-threatening recurrent upper GI bleeding. EXAM: ULTRASOUND GUIDED ACCESS RIGHT COMMON FEMORAL ARTERY MESENTERIC ANGIOGRAM COIL EMBOLIZATION GASTRODUODENAL ARTERY DEPLOYMENT OF EXOSEAL FOR HEMOSTASIS MEDICATIONS: None ANESTHESIA/SEDATION: Moderate (conscious) sedation was employed during this procedure. A total of Versed 1.0 mg and Fentanyl 50 mcg was administered intravenously. Moderate Sedation Time: 30 minutes. The patient's level of consciousness and  vital signs were monitored continuously by radiology nursing throughout the procedure under my direct supervision. CONTRAST:  47mL OMNIPAQUE IOHEXOL 300 MG/ML SOLN, 51mL OMNIPAQUE IOHEXOL 300 MG/ML SOLN FLUOROSCOPY TIME:  Fluoroscopy Time: 8 minutes 6 seconds (1,192 mGy). COMPLICATIONS: None PROCEDURE: Informed consent was obtained from the patient following explanation of the procedure, risks, benefits and alternatives. The patient understands, agrees and consents for the procedure. All questions were addressed. A time out was performed prior to the initiation of the procedure. Maximal barrier sterile technique utilized including caps, mask, sterile gowns, sterile gloves, large sterile drape, hand hygiene, and Betadine prep. Ultrasound survey of the right inguinal region was performed with images stored and sent to PACs, confirming patency of the vessel. A micropuncture needle was used access the right common femoral  artery under ultrasound. With excellent arterial blood flow returned, and an .018 micro wire was passed through the needle, observed enter the abdominal aorta under fluoroscopy. The needle was removed, and a micropuncture sheath was placed over the wire. The inner dilator and wire were removed, and an 035 Bentson wire was advanced under fluoroscopy into the abdominal aorta. The sheath was removed and a standard 5 [place] vascular sheath was placed. The dilator was removed and the sheath was flushed. Cobra catheter was advanced over the Bentson wire into the abdominal aorta. Catheter was used to select the celiac artery. Angiogram was performed. Catheter was withdrawn to the superior mesenteric artery. Angiogram was performed. Catheter was then used to select the celiac artery and a Glidewire was used to place the base catheter into the common hepatic artery, just at the origin of the gastroduodenal artery. Angiogram was performed. Microcatheter was then advanced into the gastroduodenal artery. Angiogram was performed. Empiric coil embolization was then performed within the gastroduodenal artery, extending to the origin of the GDA. Repeat angiogram was performed. Catheters and wires were removed, and an Exoseal was deployed at the right common femoral artery access for hemostasis. Patient tolerated the procedure well. Hemodynamics were unchanged during the course of the procedure, as the patient remained on pressor support. No complications encountered. No significant blood loss. IMPRESSION: Status post ultrasound guided access right common femoral artery for mesenteric angiogram and empiric coil embolization of the gastroduodenal artery for acute life-threatening upper GI hemorrhage. Signed, Dulcy Fanny. Dellia Nims, RPVI Vascular and Interventional Radiology Specialists [facility] Radiology Electronically Signed   By: Corrie Mckusick D.O.   On: 09/05/2018 08:13   Ir Angiogram Selective Each Additional Vessel  Result  Date: 09/05/2018 INDICATION: 77 year old male with a history of acute, life-threatening recurrent upper GI bleeding. EXAM: ULTRASOUND GUIDED ACCESS RIGHT COMMON FEMORAL ARTERY MESENTERIC ANGIOGRAM COIL EMBOLIZATION GASTRODUODENAL ARTERY DEPLOYMENT OF EXOSEAL FOR HEMOSTASIS MEDICATIONS: None ANESTHESIA/SEDATION: Moderate (conscious) sedation was employed during this procedure. A total of Versed 1.0 mg and Fentanyl 50 mcg was administered intravenously. Moderate Sedation Time: 30 minutes. The patient's level of consciousness and vital signs were monitored continuously by radiology nursing throughout the procedure under my direct supervision. CONTRAST:  40mL OMNIPAQUE IOHEXOL 300 MG/ML SOLN, 61mL OMNIPAQUE IOHEXOL 300 MG/ML SOLN FLUOROSCOPY TIME:  Fluoroscopy Time: 8 minutes 6 seconds (1,192 mGy). COMPLICATIONS: None PROCEDURE: Informed consent was obtained from the patient following explanation of the procedure, risks, benefits and alternatives. The patient understands, agrees and consents for the procedure. All questions were addressed. A time out was performed prior to the initiation of the procedure. Maximal barrier sterile technique utilized including caps, mask, sterile gowns, sterile gloves, large sterile drape, hand hygiene,  and Betadine prep. Ultrasound survey of the right inguinal region was performed with images stored and sent to PACs, confirming patency of the vessel. A micropuncture needle was used access the right common femoral artery under ultrasound. With excellent arterial blood flow returned, and an .018 micro wire was passed through the needle, observed enter the abdominal aorta under fluoroscopy. The needle was removed, and a micropuncture sheath was placed over the wire. The inner dilator and wire were removed, and an 035 Bentson wire was advanced under fluoroscopy into the abdominal aorta. The sheath was removed and a standard 5 [place] vascular sheath was placed. The dilator was removed and the  sheath was flushed. Cobra catheter was advanced over the Bentson wire into the abdominal aorta. Catheter was used to select the celiac artery. Angiogram was performed. Catheter was withdrawn to the superior mesenteric artery. Angiogram was performed. Catheter was then used to select the celiac artery and a Glidewire was used to place the base catheter into the common hepatic artery, just at the origin of the gastroduodenal artery. Angiogram was performed. Microcatheter was then advanced into the gastroduodenal artery. Angiogram was performed. Empiric coil embolization was then performed within the gastroduodenal artery, extending to the origin of the GDA. Repeat angiogram was performed. Catheters and wires were removed, and an Exoseal was deployed at the right common femoral artery access for hemostasis. Patient tolerated the procedure well. Hemodynamics were unchanged during the course of the procedure, as the patient remained on pressor support. No complications encountered. No significant blood loss. IMPRESSION: Status post ultrasound guided access right common femoral artery for mesenteric angiogram and empiric coil embolization of the gastroduodenal artery for acute life-threatening upper GI hemorrhage. Signed, Dulcy Fanny. Dellia Nims, RPVI Vascular and Interventional Radiology Specialists [facility] Radiology Electronically Signed   By: Corrie Mckusick D.O.   On: 09/05/2018 08:13   Ir US Guide Vasc Access Right  Result Date: 09/05/2018 INDICATION: 77 year old male with a history of acute, life-threatening recurrent upper GI bleeding. EXAM: ULTRASOUND GUIDED ACCESS RIGHT COMMON FEMORAL ARTERY MESENTERIC ANGIOGRAM COIL EMBOLIZATION GASTRODUODENAL ARTERY DEPLOYMENT OF EXOSEAL FOR HEMOSTASIS MEDICATIONS: None ANESTHESIA/SEDATION: Moderate (conscious) sedation was employed during this procedure. A total of Versed 1.0 mg and Fentanyl 50 mcg was administered intravenously. Moderate Sedation Time: 30 minutes. The  patient's level of consciousness and vital signs were monitored continuously by radiology nursing throughout the procedure under my direct supervision. CONTRAST:  59mL OMNIPAQUE IOHEXOL 300 MG/ML SOLN, 30mL OMNIPAQUE IOHEXOL 300 MG/ML SOLN FLUOROSCOPY TIME:  Fluoroscopy Time: 8 minutes 6 seconds (1,192 mGy). COMPLICATIONS: None PROCEDURE: Informed consent was obtained from the patient following explanation of the procedure, risks, benefits and alternatives. The patient understands, agrees and consents for the procedure. All questions were addressed. A time out was performed prior to the initiation of the procedure. Maximal barrier sterile technique utilized including caps, mask, sterile gowns, sterile gloves, large sterile drape, hand hygiene, and Betadine prep. Ultrasound survey of the right inguinal region was performed with images stored and sent to PACs, confirming patency of the vessel. A micropuncture needle was used access the right common femoral artery under ultrasound. With excellent arterial blood flow returned, and an .018 micro wire was passed through the needle, observed enter the abdominal aorta under fluoroscopy. The needle was removed, and a micropuncture sheath was placed over the wire. The inner dilator and wire were removed, and an 035 Bentson wire was advanced under fluoroscopy into the abdominal aorta. The sheath was removed and a standard 5  [place] vascular sheath was placed. The dilator was removed and the sheath was flushed. Cobra catheter was advanced over the Bentson wire into the abdominal aorta. Catheter was used to select the celiac artery. Angiogram was performed. Catheter was withdrawn to the superior mesenteric artery. Angiogram was performed. Catheter was then used to select the celiac artery and a Glidewire was used to place the base catheter into the common hepatic artery, just at the origin of the gastroduodenal artery. Angiogram was performed. Microcatheter was then advanced into  the gastroduodenal artery. Angiogram was performed. Empiric coil embolization was then performed within the gastroduodenal artery, extending to the origin of the GDA. Repeat angiogram was performed. Catheters and wires were removed, and an Exoseal was deployed at the right common femoral artery access for hemostasis. Patient tolerated the procedure well. Hemodynamics were unchanged during the course of the procedure, as the patient remained on pressor support. No complications encountered. No significant blood loss. IMPRESSION: Status post ultrasound guided access right common femoral artery for mesenteric angiogram and empiric coil embolization of the gastroduodenal artery for acute life-threatening upper GI hemorrhage. Signed, Dulcy Fanny. Dellia Nims, RPVI Vascular and Interventional Radiology Specialists [facility] Radiology Electronically Signed   By: Corrie Mckusick D.O.   On: 09/05/2018 08:13   Dg Chest Port 1 View  Result Date: 09/25/2018 CLINICAL DATA:  Fever. EXAM: PORTABLE CHEST 1 VIEW COMPARISON:  09/15/2018. FINDINGS: Interval removal of right PICC line. Cardiomegaly with diffuse bilateral pulmonary infiltrates and bilateral pleural effusions consistent CHF. Bilateral pneumonia cannot be excluded. No pneumothorax. IMPRESSION: 1. Cardiomegaly with diffuse bilateral pulmonary infiltrates and bilateral pleural effusions most consistent with CHF. Bilateral pneumonia cannot be excluded. Electronically Signed   By: Marcello Moores  Register   On: 09/25/2018 15:45   Dg Chest Port 1 View  Result Date: 09/15/2018 CLINICAL DATA:  Respiratory failure EXAM: PORTABLE CHEST 1 VIEW COMPARISON:  Portable chest x-ray of 09/14/2018 FINDINGS: The lungs appear somewhat better aerated. However there are persistent bilateral pleural effusions with bibasilar atelectasis. Mediastinal and hilar contours are stable and mild cardiomegaly is stable. Right PICC line overlies the lower SVC. No bony abnormality is seen. IMPRESSION: 1.  Improved aeration but persistent bilateral pleural effusions and bibasilar atelectasis. 2. Right PICC line tip overlies the lower SVC. Electronically Signed   By: Ivar Drape M.D.   On: 09/15/2018 09:04   Dg Chest Port 1 View  Result Date: 09/14/2018 CLINICAL DATA:  Acute respiratory failure and hypoxia. EXAM: PORTABLE CHEST 1 VIEW COMPARISON:  09/10/2018 FINDINGS: Right arm PICC tip is in the SVC 2 cm above the right atrium. There is less pleural fluid and less basilar atelectasis. Some does persist, left more than right. Upper lungs are clear. IMPRESSION: Radiographic improvement with less pleural fluid and basilar volume loss. Residual findings remain more pronounced on the left than the right. Electronically Signed   By: Nelson Chimes M.D.   On: 09/14/2018 16:38   Dg Chest Port 1 View  Result Date: 09/10/2018 CLINICAL DATA:  Increasing lethargy EXAM: PORTABLE CHEST 1 VIEW COMPARISON:  09/06/2018 FINDINGS: Haziness of the bilateral chest attributed to layering pleural fluid and probable atelectasis. Pneumonia would easily be obscured. There is cardiomegaly and vascular pedicle widening accentuated by rotation. Cephalized blood flow and airway cuffing. PICC on the right with tip at the SVC. A nasogastric tube has been removed. IMPRESSION: Stable haziness of the chest attributed to layering effusions and lung opacification. There is pulmonary vascular congestion. Electronically Signed   By: Angelica Chessman  Watts M.D.   On: 09/10/2018 12:05   Dg Chest Port 1 View  Result Date: 09/06/2018 CLINICAL DATA:  Acute respiratory failure. EXAM: PORTABLE CHEST 1 VIEW COMPARISON:  CT of the chest 09/02/2018 FINDINGS: Interval placement of enteric catheter, tip collimated off the image. Stable position of right-sided PICC line. Mildly enlarged cardiac silhouette. No evidence of pleural effusion. Bilateral lower lobe predominant streaky airspace opacities. Probable bilateral pleural effusions. Osseous structures are without  acute abnormality. Soft tissues are grossly normal. IMPRESSION: Bilateral lower lobe predominant streaky airspace opacities may represent atelectasis versus airspace consolidation. Probable small bilateral pleural effusions. Electronically Signed   By: Fidela Salisbury M.D.   On: 09/06/2018 07:16   [place] Guide Roadmapping  Result Date: 09/05/2018 INDICATION: 77 year old male with a history of acute, life-threatening recurrent upper GI bleeding. EXAM: ULTRASOUND GUIDED ACCESS RIGHT COMMON FEMORAL ARTERY MESENTERIC ANGIOGRAM COIL EMBOLIZATION GASTRODUODENAL ARTERY DEPLOYMENT OF EXOSEAL FOR HEMOSTASIS MEDICATIONS: None ANESTHESIA/SEDATION: Moderate (conscious) sedation was employed during this procedure. A total of Versed 1.0 mg and Fentanyl 50 mcg was administered intravenously. Moderate Sedation Time: 30 minutes. The patient's level of consciousness and vital signs were monitored continuously by radiology nursing throughout the procedure under my direct supervision. CONTRAST:  5mL OMNIPAQUE IOHEXOL 300 MG/ML SOLN, 38mL OMNIPAQUE IOHEXOL 300 MG/ML SOLN FLUOROSCOPY TIME:  Fluoroscopy Time: 8 minutes 6 seconds (1,192 mGy). COMPLICATIONS: None PROCEDURE: Informed consent was obtained from the patient following explanation of the procedure, risks, benefits and alternatives. The patient understands, agrees and consents for the procedure. All questions were addressed. A time out was performed prior to the initiation of the procedure. Maximal barrier sterile technique utilized including caps, mask, sterile gowns, sterile gloves, large sterile drape, hand hygiene, and Betadine prep. Ultrasound survey of the right inguinal region was performed with images stored and sent to PACs, confirming patency of the vessel. A micropuncture needle was used access the right common femoral artery under ultrasound. With excellent arterial blood flow returned, and an .018 micro wire was passed  through the needle, observed enter the abdominal aorta under fluoroscopy. The needle was removed, and a micropuncture sheath was placed over the wire. The inner dilator and wire were removed, and an 035 Bentson wire was advanced under fluoroscopy into the abdominal aorta. The sheath was removed and a standard 5 [place] vascular sheath was placed. The dilator was removed and the sheath was flushed. Cobra catheter was advanced over the Bentson wire into the abdominal aorta. Catheter was used to select the celiac artery. Angiogram was performed. Catheter was withdrawn to the superior mesenteric artery. Angiogram was performed. Catheter was then used to select the celiac artery and a Glidewire was used to place the base catheter into the common hepatic artery, just at the origin of the gastroduodenal artery. Angiogram was performed. Microcatheter was then advanced into the gastroduodenal artery. Angiogram was performed. Empiric coil embolization was then performed within the gastroduodenal artery, extending to the origin of the GDA. Repeat angiogram was performed. Catheters and wires were removed, and an Exoseal was deployed at the right common femoral artery access for hemostasis. Patient tolerated the procedure well. Hemodynamics were unchanged during the course of the procedure, as the patient remained on pressor support. No complications encountered. No significant blood loss. IMPRESSION: Status post ultrasound guided access right common femoral artery for mesenteric angiogram and empiric coil embolization of the gastroduodenal artery for acute life-threatening upper GI hemorrhage. Signed, Dulcy Fanny. Earleen Newport, DO,  RPVI Vascular and Interventional Radiology Specialists [facility] Radiology Electronically Signed   By: Corrie Mckusick D.O.   On: 09/05/2018 08:13   US Abdomen Limited Ruq  Result Date: 09/16/2018 CLINICAL DATA:  Abdominal pain with elevated white blood cell count and elevated liver enzymes EXAM: ULTRASOUND  ABDOMEN LIMITED RIGHT UPPER QUADRANT COMPARISON:  CT abdomen and pelvis July 29, 2018 FINDINGS: Gallbladder: Within the gallbladder, there are multiple echogenic foci which move and shadow consistent with cholelithiasis. Largest gallstone measures 2.1 cm in length. There is no gallbladder wall thickening or pericholecystic fluid. No sonographic Murphy sign noted by sonographer. Common bile duct: Diameter: 3 mm. No intrahepatic or extrahepatic biliary duct dilatation. Liver: No focal lesion identified. Within normal limits in parenchymal echogenicity. Portal vein is patent on color Doppler imaging with normal direction of blood flow towards the liver. IMPRESSION: Cholelithiasis. No gallbladder wall thickening or pericholecystic fluid. Study otherwise unremarkable. Electronically Signed   By: Lowella Grip III M.D.   On: 09/16/2018 09:45    EGD 8/29 >>large duodenal ulcer with visible vessel, biopsy c/w chronic gastritis, focal inflammation  EGD 9/10 >> no bleeding LE Doppler 9/9 >>negative ECHO: Left ventricle: The cavity size was normal. Systolic function was normal. The estimated ejection fraction was in the range of 60% to 65%. Wall motion was normal; there were no regional wall motion abnormalities. - Aortic valve: Transvalvular velocity was within the normal range. There was no stenosis. There was mild regurgitation. - Mitral valve: Mildly calcified annulus. Mildly thickened leaflets . Transvalvular velocity was within the normal range. There was no evidence for stenosis. There was no regurgitation. - Right ventricle: The cavity size was normal. Wall thickness was normal. Systolic function was normal. - Tricuspid valve: There was no regurgitation. - Pulmonary arteries: Systolic pressure was within the normal range. PA peak pressure: 30 mm Hg (S).     Subjective:  No new complaints.  Discharge Exam: Vitals:   09/29/18 1400 09/29/18 1424  BP: (!) 151/101 (!)  162/51  Pulse: 71 67  Resp: 19 17  Temp:    SpO2: 100% 100%   Vitals:   09/29/18 1227 09/29/18 1300 09/29/18 1400 09/29/18 1424  BP: (!) 163/59  (!) 151/101 (!) 162/51  Pulse: 69 80 71 67  Resp: (!) 21 19 19 17   Temp:      TempSrc:      SpO2: 100% 100% 100% 100%  Weight:      Height:        General: Pt is alert, awake, not in acute distress Cardiovascular: RRR, S1/S2 +, Respiratory: air entry fair , no wheezing heard.  Abdominal: Soft, NT, ND, bowel sounds + Extremities: trace edema     The results of significant diagnostics from this hospitalization (including imaging, microbiology, ancillary and laboratory) are listed below for reference.     Microbiology: Recent Results (from the past 240 hour(s))  Culture, blood (Routine X 2) w Reflex to ID Panel     Status: Abnormal   Collection Time: 09/25/18  2:27 PM  Result Value Ref Range Status   Specimen Description   Final    BLOOD LEFT ARM Performed at [facility] [address] [place], [place] [address]    Special Requests   Final    BOTTLES DRAWN AEROBIC AND ANAEROBIC Blood Culture adequate volume   Culture  Setup Time   Final    GRAM NEGATIVE RODS IN BOTH AEROBIC AND ANAEROBIC BOTTLES CRITICAL VALUE NOTED.  VALUE IS  CONSISTENT WITH PREVIOUSLY REPORTED AND CALLED VALUE.    Culture (A)  Final    ESCHERICHIA COLI SUSCEPTIBILITIES PERFORMED ON PREVIOUS CULTURE WITHIN THE LAST 5 DAYS. Performed at [facility], [place] [address] [place], [place] [address]    Report Status 09/29/2018 FINAL  Final  Culture, blood (Routine X 2) w Reflex to ID Panel     Status: Abnormal   Collection Time: 09/25/18  2:29 PM  Result Value Ref Range Status   Specimen Description   Final    BLOOD LEFT HAND Performed at [facility] [address] [place], [place] [address]    Special Requests   Final    BOTTLES DRAWN AEROBIC ONLY Blood Culture adequate volume   Culture  Setup Time   Final     GRAM NEGATIVE RODS AEROBIC BOTTLE ONLY CRITICAL RESULT CALLED TO, READ BACK BY AND VERIFIED WITH: Jodelle Green GREEN [id number] Performed at [facility], [address], [place], [place] [address]    Culture ESCHERICHIA COLI (A)  Final   Report Status 09/28/2018 FINAL  Final   Organism ID, Bacteria ESCHERICHIA COLI  Final      Susceptibility   Escherichia coli - MIC*    AMPICILLIN 16 INTERMEDIATE Intermediate     CEFAZOLIN <=4 SENSITIVE Sensitive     CEFEPIME <=1 SENSITIVE Sensitive     CEFTAZIDIME <=1 SENSITIVE Sensitive     CEFTRIAXONE <=1 SENSITIVE Sensitive     CIPROFLOXACIN <=0.25 SENSITIVE Sensitive     GENTAMICIN <=1 SENSITIVE Sensitive     IMIPENEM <=0.25 SENSITIVE Sensitive     TRIMETH/SULFA <=20 SENSITIVE Sensitive     AMPICILLIN/SULBACTAM 8 SENSITIVE Sensitive     PIP/TAZO <=4 SENSITIVE Sensitive     Extended ESBL NEGATIVE Sensitive     * ESCHERICHIA COLI  Blood Culture ID Panel (Reflexed)     Status: Abnormal   Collection Time: 09/25/18  2:29 PM  Result Value Ref Range Status   Enterococcus species NOT DETECTED NOT DETECTED Final   Listeria monocytogenes NOT DETECTED NOT DETECTED Final   Staphylococcus species NOT DETECTED NOT DETECTED Final   Staphylococcus aureus NOT DETECTED NOT DETECTED Final   Streptococcus species NOT DETECTED NOT DETECTED Final   Streptococcus agalactiae NOT DETECTED NOT DETECTED Final   Streptococcus pneumoniae NOT DETECTED NOT DETECTED Final   Streptococcus pyogenes NOT DETECTED NOT DETECTED Final   Acinetobacter baumannii NOT DETECTED NOT DETECTED Final   Enterobacteriaceae species DETECTED (A) NOT DETECTED Final    Comment: Enterobacteriaceae represent a large family of gram-negative bacteria, not a single organism. CRITICAL RESULT CALLED TO, READ BACK BY AND VERIFIED WITH: PHARMD B GREEN [id number] MLM    Enterobacter cloacae complex NOT DETECTED NOT DETECTED Final   Escherichia coli DETECTED (A) NOT DETECTED Final     Comment: CRITICAL RESULT CALLED TO, READ BACK BY AND VERIFIED WITH: PHARMD B GREEN [id number] 1243 MLM    Klebsiella oxytoca NOT DETECTED NOT DETECTED Final   Klebsiella pneumoniae NOT DETECTED NOT DETECTED Final   Proteus species NOT DETECTED NOT DETECTED Final   Serratia marcescens NOT DETECTED NOT DETECTED Final   Carbapenem resistance NOT DETECTED NOT DETECTED Final   Haemophilus influenzae NOT DETECTED NOT DETECTED Final   Neisseria meningitidis NOT DETECTED NOT DETECTED Final   Pseudomonas aeruginosa NOT DETECTED NOT DETECTED Final   Candida albicans NOT DETECTED NOT DETECTED Final   Candida glabrata NOT DETECTED NOT DETECTED Final   Candida krusei NOT  DETECTED NOT DETECTED Final   Candida parapsilosis NOT DETECTED NOT DETECTED Final   Candida tropicalis NOT DETECTED NOT DETECTED Final    Comment: Performed at [facility], [place] [address], [place], [place] [address]  Culture, Urine     Status: None   Collection Time: 09/25/18  4:26 PM  Result Value Ref Range Status   Specimen Description   Final    URINE, CLEAN CATCH Performed at [facility], [place] [address] [place], [place] [address]    Special Requests   Final    NONE Performed at [facility], [place] [address], [place], [place] [address]    Culture   Final    Multiple bacterial morphotypes present, none predominant. Suggest appropriate recollection if clinically indicated.   Report Status 09/26/2018 FINAL  Final  Culture, blood (Routine X 2) w Reflex to ID Panel     Status: None (Preliminary result)   Collection Time: 09/27/18  1:25 PM  Result Value Ref Range Status   Specimen Description   Final    BLOOD LEFT HAND Performed at [place] [address], [place], [place] [address]    Special Requests   Final    BOTTLES DRAWN AEROBIC ONLY Blood Culture adequate volume Performed at [place] [address], [place], [place] [address]     Culture   Final    NO GROWTH 2 DAYS Performed at [place] [address] [place], [place] [address]    Report Status PENDING  Incomplete  Culture, blood (Routine X 2) w Reflex to ID Panel     Status: None (Preliminary result)   Collection Time: 09/27/18  1:26 PM  Result Value Ref Range Status   Specimen Description   Final    BLOOD LEFT HAND Performed at [place] [address] [place], [place] [address]    Special Requests   Final    BOTTLES DRAWN AEROBIC ONLY Blood Culture adequate volume Performed at [place] [address] [place],  [address]    Culture   Final    NO GROWTH 2 DAYS Performed at [place] [address], [place],  [address]    Report Status PENDING  Incomplete  MRSA PCR Screening     Status: None   Collection Time: 09/28/18  8:47 AM  Result Value Ref Range Status   MRSA by PCR NEGATIVE NEGATIVE Final    Comment:        The GeneXpert MRSA Assay (FDA approved for NASAL specimens only), is one component of a comprehensive MRSA colonization surveillance program. It is not intended to diagnose MRSA infection nor to guide or monitor treatment for MRSA infections. Performed at [facility], [place] [address] [place],  [address]      Labs: BNP (last 3 results) Recent Labs    07/28/18 1713 08/25/18 0914 09/14/18 1549  BNP 61.6 124.2* 761.9*   Basic Metabolic Panel: Recent Labs  Lab 09/24/18 0421 09/26/18 0334 09/27/18 1609 09/28/18 0638 09/29/18 1332  NA 144 145 145 150* 149*  K 3.7 3.4* 3.8 3.4* 3.1*  CL 104 106 102 106 103  CO2 36* 35* 36* 37* 37*  GLUCOSE 133* 119* 157* 105* 125*  BUN 16 26* 23 23 16   CREATININE 0.65 1.03 0.91 0.81 0.75  CALCIUM 8.2* 7.7* 8.4* 8.6* 8.4*   Liver Function Tests: No results for input(s): AST,  ALT, ALKPHOS, BILITOT, PROT, ALBUMIN in the last 168 hours. No results for input(s): LIPASE, AMYLASE in  the last 168 hours. Recent Labs  Lab 09/27/18 1609  AMMONIA 39*   CBC: Recent Labs  Lab 09/24/18 0421 09/25/18 0746 09/26/18 0334 09/27/18 0447 09/28/18 0638  WBC 18.8* 17.9* 22.2* 22.5* 12.7*  HGB 8.6* 8.3* 8.1* 8.9* 8.0*  HCT 28.8* 27.7* 27.1* 31.4* 27.2*  MCV 95.0 96.9 96.4 99.4 96.8  PLT 235 231 218 228 204   Cardiac Enzymes: No results for input(s): CKTOTAL, CKMB, CKMBINDEX, TROPONINI in the last 168 hours. BNP: Invalid input(s): POCBNP CBG: Recent Labs  Lab 09/28/18 1228 09/28/18 1631 09/28/18 2111 09/29/18 0757 09/29/18 1153  GLUCAP 166* 147* 105* 98 232*   D-Dimer No results for input(s): DDIMER in the last 72 hours. Hgb A1c No results for input(s): HGBA1C in the last 72 hours. Lipid Profile No results for input(s): CHOL, HDL, LDLCALC, TRIG, CHOLHDL, LDLDIRECT in the last 72 hours. Thyroid function studies No results for input(s): TSH, T4TOTAL, T3FREE, THYROIDAB in the last 72 hours.  Invalid input(s): FREET3 Anemia work up No results for input(s): VITAMINB12, FOLATE, FERRITIN, TIBC, IRON, RETICCTPCT in the last 72 hours. Urinalysis    Component Value Date/Time   COLORURINE AMBER (A) 09/25/2018 1538   APPEARANCEUR TURBID (A) 09/25/2018 1538   LABSPEC 1.015 09/25/2018 1538   PHURINE 7.0 09/25/2018 1538   GLUCOSEU NEGATIVE 09/25/2018 1538   HGBUR MODERATE (A) 09/25/2018 1538   HGBUR negative 06/08/2007 1320   BILIRUBINUR NEGATIVE 09/25/2018 1538   BILIRUBINUR NEG 02/25/2012 1351   KETONESUR NEGATIVE 09/25/2018 1538   PROTEINUR 30 (A) 09/25/2018 1538   UROBILINOGEN 1.0 11/10/2012 2115   NITRITE NEGATIVE 09/25/2018 1538   LEUKOCYTESUR MODERATE (A) 09/25/2018 1538   Sepsis Labs Invalid input(s): PROCALCITONIN,  WBC,  LACTICIDVEN Microbiology Recent Results (from the past 240 hour(s))  Culture, blood (Routine X 2) w Reflex to ID Panel     Status: Abnormal   Collection Time: 09/25/18  2:27 PM  Result Value Ref Range Status   Specimen Description    Final    BLOOD LEFT ARM Performed at [facility], [place] [address], [place], [place] [address]    Special Requests   Final    BOTTLES DRAWN AEROBIC AND ANAEROBIC Blood Culture adequate volume   Culture  Setup Time   Final    GRAM NEGATIVE RODS IN BOTH AEROBIC AND ANAEROBIC BOTTLES CRITICAL VALUE NOTED.  VALUE IS CONSISTENT WITH PREVIOUSLY REPORTED AND CALLED VALUE.    Culture (A)  Final    ESCHERICHIA COLI SUSCEPTIBILITIES PERFORMED ON PREVIOUS CULTURE WITHIN THE LAST 5 DAYS. Performed at [facility], [place] [address] [place], [place] [address]    Report Status 09/29/2018 FINAL  Final  Culture, blood (Routine X 2) w Reflex to ID Panel     Status: Abnormal   Collection Time: 09/25/18  2:29 PM  Result Value Ref Range Status   Specimen Description   Final    BLOOD LEFT HAND Performed at [place] [address], [place], [place] [address]    Special Requests   Final    BOTTLES DRAWN AEROBIC ONLY Blood Culture adequate volume   Culture  Setup Time   Final    GRAM NEGATIVE RODS AEROBIC BOTTLE ONLY CRITICAL RESULT CALLED TO, READ BACK BY AND VERIFIED WITH: Jodelle Green GREEN [id number] MLM Performed at [facility], [address], [place], [place] [address]  Culture ESCHERICHIA COLI (A)  Final   Report Status 09/28/2018 FINAL  Final   Organism ID, Bacteria ESCHERICHIA COLI  Final      Susceptibility   Escherichia coli - MIC*    AMPICILLIN 16 INTERMEDIATE Intermediate     CEFAZOLIN <=4 SENSITIVE Sensitive     CEFEPIME <=1 SENSITIVE Sensitive     CEFTAZIDIME <=1 SENSITIVE Sensitive     CEFTRIAXONE <=1 SENSITIVE Sensitive     CIPROFLOXACIN <=0.25 SENSITIVE Sensitive     GENTAMICIN <=1 SENSITIVE Sensitive     IMIPENEM <=0.25 SENSITIVE Sensitive     TRIMETH/SULFA <=20 SENSITIVE Sensitive     AMPICILLIN/SULBACTAM 8 SENSITIVE Sensitive     PIP/TAZO <=4 SENSITIVE Sensitive     Extended ESBL NEGATIVE Sensitive     * ESCHERICHIA  COLI  Blood Culture ID Panel (Reflexed)     Status: Abnormal   Collection Time: 09/25/18  2:29 PM  Result Value Ref Range Status   Enterococcus species NOT DETECTED NOT DETECTED Final   Listeria monocytogenes NOT DETECTED NOT DETECTED Final   Staphylococcus species NOT DETECTED NOT DETECTED Final   Staphylococcus aureus NOT DETECTED NOT DETECTED Final   Streptococcus species NOT DETECTED NOT DETECTED Final   Streptococcus agalactiae NOT DETECTED NOT DETECTED Final   Streptococcus pneumoniae NOT DETECTED NOT DETECTED Final   Streptococcus pyogenes NOT DETECTED NOT DETECTED Final   Acinetobacter baumannii NOT DETECTED NOT DETECTED Final   Enterobacteriaceae species DETECTED (A) NOT DETECTED Final    Comment: Enterobacteriaceae represent a large family of gram-negative bacteria, not a single organism. CRITICAL RESULT CALLED TO, READ BACK BY AND VERIFIED WITH: PHARMD B GREEN [id number] 1243 MLM    Enterobacter cloacae complex NOT DETECTED NOT DETECTED Final   Escherichia coli DETECTED (A) NOT DETECTED Final    Comment: CRITICAL RESULT CALLED TO, READ BACK BY AND VERIFIED WITH: PHARMD B GREEN [id number] 1243 MLM    Klebsiella oxytoca NOT DETECTED NOT DETECTED Final   Klebsiella pneumoniae NOT DETECTED NOT DETECTED Final   Proteus species NOT DETECTED NOT DETECTED Final   Serratia marcescens NOT DETECTED NOT DETECTED Final   Carbapenem resistance NOT DETECTED NOT DETECTED Final   Haemophilus influenzae NOT DETECTED NOT DETECTED Final   Neisseria meningitidis NOT DETECTED NOT DETECTED Final   Pseudomonas aeruginosa NOT DETECTED NOT DETECTED Final   Candida albicans NOT DETECTED NOT DETECTED Final   Candida glabrata NOT DETECTED NOT DETECTED Final   Candida krusei NOT DETECTED NOT DETECTED Final   Candida parapsilosis NOT DETECTED NOT DETECTED Final   Candida tropicalis NOT DETECTED NOT DETECTED Final    Comment: Performed at [facility], [place] [address], [place], [place] [address]   Culture, Urine     Status: None   Collection Time: 09/25/18  4:26 PM  Result Value Ref Range Status   Specimen Description   Final    URINE, CLEAN CATCH Performed at [facility], [place] [address], [place], [place] [address]    Special Requests   Final    NONE Performed at [facility], [place] [address] [place], [place] [address]    Culture   Final    Multiple bacterial morphotypes present, none predominant. Suggest appropriate recollection if clinically indicated.   Report Status 09/26/2018 FINAL  Final  Culture, blood (Routine X 2) w Reflex to ID Panel     Status: None (Preliminary result)   Collection Time: 09/27/18  1:25 PM  Result Value Ref Range Status  Specimen Description   Final    BLOOD LEFT HAND Performed at [place] [address], [place], [place] [address]    Special Requests   Final    BOTTLES DRAWN AEROBIC ONLY Blood Culture adequate volume Performed at [place] [address] [place], [place] [address]    Culture   Final    NO GROWTH 2 DAYS Performed at [place] [address], [place], [place] [address]    Report Status PENDING  Incomplete  Culture, blood (Routine X 2) w Reflex to ID Panel     Status: None (Preliminary result)   Collection Time: 09/27/18  1:26 PM  Result Value Ref Range Status   Specimen Description   Final    BLOOD LEFT HAND Performed at [place] [address] [place], [place] [address]    Special Requests   Final    BOTTLES DRAWN AEROBIC ONLY Blood Culture adequate volume Performed at [facility] [address], [place], [place] [address]    Culture   Final    NO GROWTH 2 DAYS Performed at [place] [address], [place], [place] [address]    Report Status PENDING  Incomplete  MRSA PCR Screening     Status: None   Collection Time: 09/28/18  8:47 AM  Result Value Ref Range Status    MRSA by PCR NEGATIVE NEGATIVE Final    Comment:        The GeneXpert MRSA Assay (FDA approved for NASAL specimens only), is one component of a comprehensive MRSA colonization surveillance program. It is not intended to diagnose MRSA infection nor to guide or monitor treatment for MRSA infections. Performed at [facility], [place] [address] [place], [place] [address]      Time coordinating discharge:35  minutes  SIGNED:   Hosie Poisson, MD  [facility] 09/29/2018, 2:52 PM Pager   If 7PM-7AM, please contact night-coverage [email or web address] Password [email or web address]

## 2018-09-29 NOTE — Care Management (Signed)
[id number]/VXBOZW Davis,BSN,RN3,CCM: pt does have insurance coverage for ltach, awaiting the daughter to make the decision as to which facility and when.

## 2018-09-29 NOTE — Progress Notes (Signed)
Patient accepted to [facility]. Daughter reports she will continue to pursue medicaid for the patient.  CSW signing off.   Kathrin Greathouse, Marlinda Mike, MSW Clinical Social Worker  [phone] 09/29/2018  5:31 PM

## 2018-09-29 NOTE — Progress Notes (Signed)
Mr. Hauk has decided to go to [facility] for [facility].  To be transferred 09/30/2018.  Chesnee Floren Roselie Awkward RN

## 2018-09-29 NOTE — Progress Notes (Signed)
Nutrition Follow-up  DOCUMENTATION CODES:   Non-severe (moderate) malnutrition in context of chronic illness, Obesity unspecified  INTERVENTION:  - Continue Boost Breeze BID and Prostat BID.  - Continue to encourage PO intakes. - Continue 200 mL free water PO TID per MD order.    NUTRITION DIAGNOSIS:   Moderate Malnutrition related to chronic illness(chronic osteomyelitis) as evidenced by mild fat depletion, moderate muscle depletion. -ongoing  GOAL:   Patient will meet greater than or equal to 90% of their needs -inconsistently met  MONITOR:   PO intake, Supplement acceptance, Weight trends, Labs, Skin  ASSESSMENT:   77 yo male who was admitted due to melena and hypotension, hypovolemic shock d/t acute blood loss anemia complicated by AKI. He has PMH significant for He chronic osteomyelitis, CHF, type 2 DM, GERD, hiatal hernia, and HTN.  He was currently treated for C. difficile colitis, and noticed at home melanotic stools. Recent hospitalization for sepsis and cardiac arrest 07/31/18.  Weight beginning to trend back down; fluctuating often throughout this hospitalization. Per chart review, patient consumed 85% of dinner on 9/25, 100% of breakfast on 9/27, 50% of breakfast and dinner on 9/28, and 85% of lunch on 9/29. Patient reports that he has had more of an appetite the past few days to a week and that he is eating better. The last time this RD saw patient, he was mainly only eating bites at meals. He states that breakfast this AM was sausage, scrambled eggs, cereal with milk, and coffee and that he ate the majority of this meal.   Per Dr. Petra Kuba note yesterday afternoon: GIB from duodenal ulcer now seems resolved with no signs of bleeding and Hgb stable between 8-9, resolved acute pulmonary embolism, AKI resolved, mostly bed bound, resolved HTN, adrenal insufficiency, chronic venous insufficiency, questionable E. coli bacteremia, mild acute on CHF, acute encephalopathy with head CT  negative, hypernatremia thought to be 2/2 poor PO intakes and free water ordered.    Medications reviewed; 40 mg oral Lasix/day, 10 mg Cortef/day, sliding scale Novolog, 40 mg oral Protonix BID, 1 g Carafate TID.  Labs reviewed; CBGs: 98 and 232 mg/dL today, Na: 149 mmol/L, K: 3.1 mmol/L, Ca: 8.4 mg/dL.      Diet Order:   Diet Order            DIET SOFT Room service appropriate? Yes; Fluid consistency: Thin  Diet effective now        Diet - low sodium heart healthy              EDUCATION NEEDS:   No education needs have been identified at this time  Skin:  Skin Assessment: Skin Integrity Issues: Skin Integrity Issues:: Unstageable Stage II: L heel, L thigh, coccyx, sacrum Unstageable: full thickness to R toe  Last BM:  9/30  Height:   Ht Readings from Last 1 Encounters:  09/10/18 6' (1.829 m)    Weight:   Wt Readings from Last 1 Encounters:  09/29/18 (!) 140.7 kg    Ideal Body Weight:  80.91 kg  BMI:  Body mass index is 42.07 kg/m.  Estimated Nutritional Needs:   Kcal:  1884-1660 (15-17 kcal/kg)  Protein:  120-130 grams  Fluid:  >/= 2 L/day     Jarome Matin, MS, RD, LDN, [facility] Inpatient Clinical Dietitian Pager # [phone] After hours/weekend pager # [phone]

## 2018-09-30 LAB — GLUCOSE, CAPILLARY
GLUCOSE-CAPILLARY: 165 mg/dL — AB (ref 70–99)
GLUCOSE-CAPILLARY: 177 mg/dL — AB (ref 70–99)
GLUCOSE-CAPILLARY: 134 mg/dL — AB (ref 70–99)
Glucose-Capillary: 109 mg/dL — ABNORMAL HIGH (ref 70–99)

## 2018-09-30 NOTE — Progress Notes (Addendum)
[name]   Patient seen this morning, he has been cleared for discharge to LTAC.  See discharge summary for full details.  Patient remained stable for discharge.  Chart reviewed, plan discussed with patient who is in agreement.  Awaiting for prior authorization.  Continue current plan of care.  Patient [facility] from [facility] for [facility] term rehabilitation given weakness from prolonged hospital stay. Patient with multiple comorbidities and has significantly declined during hospital stay.  Patient was seen by palliative care on 9/6 who recommended patient to be DNR, and a MOST form to be completed.  Patient still have hopes for recovery however his prognosis is a very high risk for death within 65-month.  Patient seems motivated to attempt rehabilitation, however at baseline he could transfer to power chair only.  Patient my qualify for hospice service depending on rehab outcome especially if patient desires to be at home.  Chipper Oman, MD

## 2018-09-30 NOTE — Care Management Note (Addendum)
Case Management Note  Patient Details  Name: [name] MRN: [id number] Date of Birth: 03-05-1941  Subjective/Objective:                  Discharge planning-[facility] has [facility] Action/Plan: Will get prior approval for transfer from [employer] Prior approval faxed to [employer] at [phone]. Expected Discharge Date:  08/22/18               Expected Discharge Plan:  Skilled Nursing Facility  In-House Referral:  Clinical Social Work  Discharge planning Services  CM Consult  Post Acute Care Choice:  NA Choice offered to:  NA  DME Arranged:  N/A DME Agency:  NA  HH Arranged:  NA HH Agency:  NA  Status of Service:  Completed, signed off  If discussed at [employer] of Stay Meetings, dates discussed:    Additional Comments:  Leeroy Cha, RN 09/30/2018, 9:17 AM

## 2018-10-01 DIAGNOSIS — K08409 Partial loss of teeth, unspecified cause, unspecified class: Secondary | ICD-10-CM | POA: Diagnosis not present

## 2018-10-01 DIAGNOSIS — I2699 Other pulmonary embolism without acute cor pulmonale: Secondary | ICD-10-CM | POA: Diagnosis not present

## 2018-10-01 DIAGNOSIS — Z7409 Other reduced mobility: Secondary | ICD-10-CM | POA: Diagnosis not present

## 2018-10-01 DIAGNOSIS — I890 Lymphedema, not elsewhere classified: Secondary | ICD-10-CM | POA: Diagnosis not present

## 2018-10-01 DIAGNOSIS — J80 Acute respiratory distress syndrome: Secondary | ICD-10-CM | POA: Diagnosis not present

## 2018-10-01 DIAGNOSIS — Z8719 Personal history of other diseases of the digestive system: Secondary | ICD-10-CM | POA: Diagnosis not present

## 2018-10-01 DIAGNOSIS — D72829 Elevated white blood cell count, unspecified: Secondary | ICD-10-CM | POA: Diagnosis not present

## 2018-10-01 DIAGNOSIS — I8291 Chronic embolism and thrombosis of unspecified vein: Secondary | ICD-10-CM | POA: Diagnosis not present

## 2018-10-01 DIAGNOSIS — M6281 Muscle weakness (generalized): Secondary | ICD-10-CM | POA: Diagnosis not present

## 2018-10-01 DIAGNOSIS — E2740 Unspecified adrenocortical insufficiency: Secondary | ICD-10-CM | POA: Diagnosis not present

## 2018-10-01 DIAGNOSIS — Z7401 Bed confinement status: Secondary | ICD-10-CM | POA: Diagnosis not present

## 2018-10-01 DIAGNOSIS — R1084 Generalized abdominal pain: Secondary | ICD-10-CM | POA: Diagnosis not present

## 2018-10-01 DIAGNOSIS — I255 Ischemic cardiomyopathy: Secondary | ICD-10-CM | POA: Diagnosis not present

## 2018-10-01 DIAGNOSIS — R0602 Shortness of breath: Secondary | ICD-10-CM | POA: Diagnosis not present

## 2018-10-01 DIAGNOSIS — J9601 Acute respiratory failure with hypoxia: Secondary | ICD-10-CM | POA: Diagnosis not present

## 2018-10-01 DIAGNOSIS — E874 Mixed disorder of acid-base balance: Secondary | ICD-10-CM | POA: Diagnosis not present

## 2018-10-01 DIAGNOSIS — R748 Abnormal levels of other serum enzymes: Secondary | ICD-10-CM | POA: Diagnosis not present

## 2018-10-01 DIAGNOSIS — M86661 Other chronic osteomyelitis, right tibia and fibula: Secondary | ICD-10-CM | POA: Diagnosis not present

## 2018-10-01 DIAGNOSIS — I82891 Chronic embolism and thrombosis of other specified veins: Secondary | ICD-10-CM | POA: Diagnosis not present

## 2018-10-01 DIAGNOSIS — R031 Nonspecific low blood-pressure reading: Secondary | ICD-10-CM | POA: Diagnosis not present

## 2018-10-01 DIAGNOSIS — I5022 Chronic systolic (congestive) heart failure: Secondary | ICD-10-CM | POA: Diagnosis not present

## 2018-10-01 DIAGNOSIS — I110 Hypertensive heart disease with heart failure: Secondary | ICD-10-CM | POA: Diagnosis not present

## 2018-10-01 DIAGNOSIS — E1140 Type 2 diabetes mellitus with diabetic neuropathy, unspecified: Secondary | ICD-10-CM | POA: Diagnosis not present

## 2018-10-01 DIAGNOSIS — Z981 Arthrodesis status: Secondary | ICD-10-CM | POA: Diagnosis not present

## 2018-10-01 DIAGNOSIS — K269 Duodenal ulcer, unspecified as acute or chronic, without hemorrhage or perforation: Secondary | ICD-10-CM | POA: Diagnosis not present

## 2018-10-01 DIAGNOSIS — E876 Hypokalemia: Secondary | ICD-10-CM | POA: Diagnosis not present

## 2018-10-01 DIAGNOSIS — G4733 Obstructive sleep apnea (adult) (pediatric): Secondary | ICD-10-CM | POA: Diagnosis not present

## 2018-10-01 DIAGNOSIS — R0902 Hypoxemia: Secondary | ICD-10-CM | POA: Diagnosis not present

## 2018-10-01 DIAGNOSIS — L89893 Pressure ulcer of other site, stage 3: Secondary | ICD-10-CM | POA: Diagnosis not present

## 2018-10-01 DIAGNOSIS — I10 Essential (primary) hypertension: Secondary | ICD-10-CM | POA: Diagnosis not present

## 2018-10-01 DIAGNOSIS — E870 Hyperosmolality and hypernatremia: Secondary | ICD-10-CM | POA: Diagnosis not present

## 2018-10-01 DIAGNOSIS — R109 Unspecified abdominal pain: Secondary | ICD-10-CM | POA: Diagnosis not present

## 2018-10-01 DIAGNOSIS — E8770 Fluid overload, unspecified: Secondary | ICD-10-CM | POA: Diagnosis not present

## 2018-10-01 DIAGNOSIS — J069 Acute upper respiratory infection, unspecified: Secondary | ICD-10-CM | POA: Diagnosis not present

## 2018-10-01 DIAGNOSIS — Z452 Encounter for adjustment and management of vascular access device: Secondary | ICD-10-CM | POA: Diagnosis not present

## 2018-10-01 DIAGNOSIS — R001 Bradycardia, unspecified: Secondary | ICD-10-CM | POA: Diagnosis not present

## 2018-10-01 DIAGNOSIS — Z9989 Dependence on other enabling machines and devices: Secondary | ICD-10-CM | POA: Diagnosis not present

## 2018-10-01 DIAGNOSIS — M2550 Pain in unspecified joint: Secondary | ICD-10-CM | POA: Diagnosis not present

## 2018-10-01 DIAGNOSIS — Z6841 Body Mass Index (BMI) 40.0 and over, adult: Secondary | ICD-10-CM | POA: Diagnosis not present

## 2018-10-01 DIAGNOSIS — M79606 Pain in leg, unspecified: Secondary | ICD-10-CM | POA: Diagnosis not present

## 2018-10-01 DIAGNOSIS — D649 Anemia, unspecified: Secondary | ICD-10-CM | POA: Diagnosis not present

## 2018-10-01 DIAGNOSIS — E1142 Type 2 diabetes mellitus with diabetic polyneuropathy: Secondary | ICD-10-CM | POA: Diagnosis not present

## 2018-10-01 DIAGNOSIS — I82611 Acute embolism and thrombosis of superficial veins of right upper extremity: Secondary | ICD-10-CM | POA: Diagnosis not present

## 2018-10-01 DIAGNOSIS — R195 Other fecal abnormalities: Secondary | ICD-10-CM | POA: Diagnosis not present

## 2018-10-01 DIAGNOSIS — E440 Moderate protein-calorie malnutrition: Secondary | ICD-10-CM | POA: Diagnosis not present

## 2018-10-01 DIAGNOSIS — J9622 Acute and chronic respiratory failure with hypercapnia: Secondary | ICD-10-CM | POA: Diagnosis not present

## 2018-10-01 DIAGNOSIS — J449 Chronic obstructive pulmonary disease, unspecified: Secondary | ICD-10-CM | POA: Diagnosis not present

## 2018-10-01 DIAGNOSIS — R578 Other shock: Secondary | ICD-10-CM | POA: Diagnosis not present

## 2018-10-01 DIAGNOSIS — E785 Hyperlipidemia, unspecified: Secondary | ICD-10-CM | POA: Diagnosis not present

## 2018-10-01 DIAGNOSIS — J90 Pleural effusion, not elsewhere classified: Secondary | ICD-10-CM | POA: Diagnosis not present

## 2018-10-01 DIAGNOSIS — Z794 Long term (current) use of insulin: Secondary | ICD-10-CM | POA: Diagnosis not present

## 2018-10-01 DIAGNOSIS — Z86711 Personal history of pulmonary embolism: Secondary | ICD-10-CM | POA: Diagnosis not present

## 2018-10-01 DIAGNOSIS — L89213 Pressure ulcer of right hip, stage 3: Secondary | ICD-10-CM | POA: Diagnosis not present

## 2018-10-01 DIAGNOSIS — L8990 Pressure ulcer of unspecified site, unspecified stage: Secondary | ICD-10-CM | POA: Diagnosis not present

## 2018-10-01 DIAGNOSIS — E873 Alkalosis: Secondary | ICD-10-CM | POA: Diagnosis not present

## 2018-10-01 DIAGNOSIS — I5033 Acute on chronic diastolic (congestive) heart failure: Secondary | ICD-10-CM | POA: Diagnosis not present

## 2018-10-01 DIAGNOSIS — K922 Gastrointestinal hemorrhage, unspecified: Secondary | ICD-10-CM | POA: Diagnosis not present

## 2018-10-01 DIAGNOSIS — I5043 Acute on chronic combined systolic (congestive) and diastolic (congestive) heart failure: Secondary | ICD-10-CM | POA: Diagnosis not present

## 2018-10-01 DIAGNOSIS — J181 Lobar pneumonia, unspecified organism: Secondary | ICD-10-CM | POA: Diagnosis not present

## 2018-10-01 DIAGNOSIS — I5023 Acute on chronic systolic (congestive) heart failure: Secondary | ICD-10-CM | POA: Diagnosis not present

## 2018-10-01 DIAGNOSIS — R609 Edema, unspecified: Secondary | ICD-10-CM | POA: Diagnosis not present

## 2018-10-01 DIAGNOSIS — R197 Diarrhea, unspecified: Secondary | ICD-10-CM | POA: Diagnosis not present

## 2018-10-01 DIAGNOSIS — K219 Gastro-esophageal reflux disease without esophagitis: Secondary | ICD-10-CM | POA: Diagnosis not present

## 2018-10-01 DIAGNOSIS — E119 Type 2 diabetes mellitus without complications: Secondary | ICD-10-CM | POA: Diagnosis not present

## 2018-10-01 DIAGNOSIS — J9621 Acute and chronic respiratory failure with hypoxia: Secondary | ICD-10-CM | POA: Diagnosis not present

## 2018-10-01 DIAGNOSIS — R601 Generalized edema: Secondary | ICD-10-CM | POA: Diagnosis not present

## 2018-10-01 LAB — BASIC METABOLIC PANEL
Anion gap: 10 (ref 5–15)
BUN: 10 mg/dL (ref 8–23)
CO2: 37 mmol/L — AB (ref 22–32)
Calcium: 8.3 mg/dL — ABNORMAL LOW (ref 8.9–10.3)
Chloride: 98 mmol/L (ref 98–111)
Creatinine, Ser: 0.65 mg/dL (ref 0.61–1.24)
GFR calc Af Amer: >60 mL/min (ref >60)
GFR calc non Af Amer: >60 mL/min (ref >60)
GLUCOSE: 153 mg/dL — AB (ref 70–99)
POTASSIUM: 2.9 mmol/L — AB (ref 3.5–5.1)
Sodium: 145 mmol/L (ref 135–145)

## 2018-10-01 LAB — GLUCOSE, CAPILLARY
Glucose-Capillary: 113 mg/dL — ABNORMAL HIGH (ref 70–99)
Glucose-Capillary: 177 mg/dL — ABNORMAL HIGH (ref 70–99)
Glucose-Capillary: 156 mg/dL — ABNORMAL HIGH (ref 70–99)

## 2018-10-01 LAB — CBC
HEMATOCRIT: 28.8 % — AB (ref 39.0–52.0)
Hemoglobin: 8.6 g/dL — ABNORMAL LOW (ref 13.0–17.0)
MCH: 27.9 pg (ref 26.0–34.0)
MCHC: 29.9 g/dL — ABNORMAL LOW (ref 30.0–36.0)
MCV: 93.5 fL (ref 78.0–100.0)
Platelets: 252 10*3/uL (ref 150–400)
RBC: 3.08 MIL/uL — ABNORMAL LOW (ref 4.22–5.81)
RDW: 14.7 % (ref 11.5–15.5)
WBC: 7.9 10*3/uL (ref 4.0–10.5)

## 2018-10-01 NOTE — Plan of Care (Signed)
TR[facility]   Patient seen, chart reviewed. Patient remains stable for discharge to SNF. Recommend strict BiPAP at night. See Dr. Karleen Hampshire d/c summary for full details.   Chipper Oman, MD

## 2018-10-01 NOTE — Care Management (Signed)
thn has dendied the ltach admission but is willing to consider snf at this point.

## 2018-10-01 NOTE — Consult Note (Signed)
   [facility] CM Inpatient Consult   10/01/2018  [name] 07-07-1941 [id number]   [facility] Care Management follow up.  Chart reviewed. Noted [name] is discharging to [facility] SNF today. Will make [facility] post acute care coordinator aware. Also noted that patient's daughter was in the process of applying for Medicaid.   Marthenia Rolling, MSN-Ed, RN,BSN [facility] Liaison [phone]

## 2018-10-01 NOTE — NC FL2 (Addendum)
[place] LEVEL OF CARE SCREENING TOOL     IDENTIFICATION  Patient Name: [name] Birthdate: 1941-08-03 Sex: male Admission Date (Current Location): 08/19/2018  [facility] and [place] Number:  Herbalist and Address:  [facility],  [address] [place], [place]      Provider Number: [id number]  Attending Physician Name and Address:  Patrecia Pour, Christean Grief, MD  Relative Name and Phone Number:  Cheri Rous: [phone]    Current Level of Care: Hospital Recommended Level of Care: Lane Prior Approval Number:    Date Approved/Denied:   PASRR Number: [id number]  Discharge Plan: SNF    Current Diagnoses: Patient Active Problem List   Diagnosis Date Noted  . Abdominal pain   . Leukocytosis   . Elevated alkaline phosphatase measurement   . Systolic CHF, acute on chronic ([place]) 09/11/2018  . Anasarca 09/11/2018  . Acute pulmonary embolism ([place]) 09/11/2018  . Acute respiratory failure ([place])   . Duodenal ulcer   . Malnutrition of moderate degree 08/24/2018  . Hemorrhagic shock ([place])   . Pressure injury of skin 08/20/2018  . Gastrointestinal hemorrhage 08/19/2018  . Acute respiratory failure with hypoxemia ([place])   . Possible OSA; on empiric CPAP 08/03/2018  . Fungus present in urine 08/03/2018  . Abdominal aortic atherosclerosis ([place]) 07/29/2018  . Pyuria 07/29/2018  . Sacral decubitus ulcer, stage III ([place]) 07/29/2018  . Somnolence 07/29/2018  . Acute respiratory acidosis 07/29/2018  . Poor personal hygiene 07/29/2018  . Hemoglobin decreased 07/29/2018  . Sepsis ([place]) 07/28/2018  . Pneumonia 06/04/2018  . Dehydration   . Possible HCAP (healthcare-associated pneumonia)   . Hypotension   . Grade II diastolic dysfunction   . Fluid overload 05/19/2018  . Elephantiasis nostra verrucosa 05/19/2018  . Pressure ulcer of upper thigh, right, stage III ([place]) 05/19/2018  . Candidal intertrigo 05/19/2018  .  Chronic venous insufficiency, Bilateral Legs 05/19/2018  . Elevated PSA, greater than or equal to 20 ng/ml 05/19/2018  . History of prostate surgery, Per Patient report, [facility] 05/19/2018  . Infestation by bed bug 05/19/2018  . Lives alone with limited help 05/19/2018  . Morbid obesity ([place]) 05/19/2018  . SOB (shortness of breath) 05/19/2018  . Systolic ejection murmur with radiation into Cartoids bilaterally 05/19/2018  . Paget's bone disease 05/19/2018  . Pulmonary hypertension ([place]) 05/19/2018  . Hypoxia   . AKI (acute kidney injury) ([place])   . Impaired mobility 09/09/2014  . Ulcer of left lower leg ([place]) 10/05/2012  . Encounter for central line placement 10/05/2012  . Anemia of chronic disease 05/21/2012  . Chronic leg pain 11/17/2011  . HLD (hyperlipidemia) 12/01/2008  . ADENOCARCINOMA, PROSTATE 05/24/2008  . Morbid obesity with body mass index of 50.0-59.9 in adult ([place]) 03/05/2007  . Essential hypertension 03/05/2007    Orientation RESPIRATION BLADDER Height & Weight     Place, Situation, Self, Time  O2 External catheter Weight: (!) 309 lb 11.9 oz (140.5 kg) Height:  6' (182.9 cm)  BEHAVIORAL SYMPTOMS/MOOD NEUROLOGICAL BOWEL NUTRITION STATUS      Incontinent Diet  AMBULATORY STATUS COMMUNICATION OF NEEDS Skin   Extensive Assist Verbally PU Stage and Appropriate Care   PU Stage 2 Dressing: (Left Heel, Sacrum, Coccyx, Thigh, Bilateral Legs, )                      Personal Care Assistance Level of Assistance  Bathing, Feeding, Dressing Bathing Assistance: Maximum assistance Feeding assistance:  Independent Dressing Assistance: Maximum assistance     Functional Limitations Info  Speech, Hearing, Sight Sight Info: Adequate Hearing Info: Adequate Speech Info: Adequate    SPECIAL CARE FACTORS FREQUENCY  PT (By licensed PT), OT (By licensed OT)     PT Frequency: 5x/weekly OT Frequency: 5x/weekly            Contractures Contractures Info: Present     Additional Factors Info  Code Status, Allergies Code Status Info: Partial Allergies Info: ACE INHIBITORS, ANGIOTENSIN RECEPTOR BLOCKERS, METFORMIN    Insulin Sliding Scale Info: See medication list        Current Medications (10/01/2018):  This is the current hospital active medication list Current Facility-Administered Medications  Medication Dose Route Frequency Provider Last Rate Last Dose  . 0.9 %  sodium chloride infusion   Intravenous PRN Hosie Poisson, MD 10 mL/hr at 10/01/18 1011 250 mL at 10/01/18 1011  . acetaminophen (TYLENOL) tablet 650 mg  650 mg Oral Q4H PRN Hosie Poisson, MD   650 mg at 09/26/18 0107  . apixaban (ELIQUIS) tablet 5 mg  5 mg Oral BID Hosie Poisson, MD   5 mg at 10/01/18 0959  . cefTRIAXone (ROCEPHIN) 2 g in sodium chloride 0.9 % 100 mL IVPB  2 g Intravenous Daily Hosie Poisson, MD   Stopped at 10/01/18 1045  . chlorhexidine (PERIDEX) 0.12 % solution 15 mL  15 mL Mouth Rinse BID Hosie Poisson, MD   15 mL at 10/01/18 0957  . feeding supplement (BOOST / RESOURCE BREEZE) liquid 1 Container  1 Container Oral BID BM Hosie Poisson, MD   1 Container at 09/30/18 1425  . feeding supplement (PRO-STAT SUGAR FREE 64) liquid 30 mL  30 mL Oral BID Hosie Poisson, MD   30 mL at 10/01/18 0957  . free water 200 mL  200 mL Oral Q8H Hosie Poisson, MD   200 mL at 09/30/18 1459  . furosemide (LASIX) tablet 40 mg  40 mg Oral Daily Hosie Poisson, MD   40 mg at 10/01/18 0958  . hydrALAZINE (APRESOLINE) injection 10 mg  10 mg Intravenous Q6H PRN Hosie Poisson, MD   10 mg at 10/01/18 1144  . hydrocortisone (CORTEF) tablet 10 mg  10 mg Oral Daily Hosie Poisson, MD   10 mg at 10/01/18 0958  . insulin aspart (novoLOG) injection 0-15 Units  0-15 Units Subcutaneous TID WC Hosie Poisson, MD   3 Units at 10/01/18 1137  . insulin aspart (novoLOG) injection 0-5 Units  0-5 Units Subcutaneous QHS Hosie Poisson, MD      . iohexol (OMNIPAQUE) 300 MG/ML solution 100 mL  100 mL Intravenous Once PRN  Hosie Poisson, MD      . latanoprost (XALATAN) 0.005 % ophthalmic solution 1 drop  1 drop Both Eyes QHS Hosie Poisson, MD   1 drop at 09/30/18 2235  . MEDLINE mouth rinse  15 mL Mouth Rinse q12n4p Hosie Poisson, MD   15 mL at 10/01/18 1137  . metoprolol tartrate (LOPRESSOR) tablet 12.5 mg  12.5 mg Oral BID Hosie Poisson, MD   12.5 mg at 10/01/18 0957  . ondansetron (ZOFRAN) injection 4 mg  4 mg Intravenous Q6H PRN Hosie Poisson, MD   4 mg at 09/06/18 0552  . pantoprazole (PROTONIX) EC tablet 40 mg  40 mg Oral BID AC Hosie Poisson, MD   40 mg at 10/01/18 0812  . sucralfate (CARAFATE) tablet 1 g  1 g Oral TID WC & HS Hosie Poisson, MD   1  g at 10/01/18 1137     Discharge Medications: Please see discharge summary for a list of discharge medications.  Relevant Imaging Results:  Relevant Lab Results:   Additional Information SSN: [id number]  Pricilla Holm, [place]

## 2018-10-01 NOTE — Clinical Social Work Placement (Signed)
Patient discharging to [facility].  CSW confirmed insurance authorization, bed availability at facility, and faxed appropriate documents. [facility] will be called for transport. RN call report to: [phone].  CSW signing off.  CLINICAL SOCIAL WORK PLACEMENT  NOTE  Date:  10/01/2018  Patient Details  Name: [name] MRN: [id number] Date of Birth: Jun 21, 1941  Clinical Social Work is seeking post-discharge placement for this patient at the [place] level of care (*CSW will initial, date and re-position this form in  chart as items are completed):  Yes   Patient/family provided with [facility] Work Department's list of facilities offering this level of care within the geographic area requested by the patient (or if unable, by the patient's family).  Yes   Patient/family informed of their freedom to choose among providers that offer the needed level of care, that participate in Medicare, Medicaid or managed care program needed by the patient, have an available bed and are willing to accept the patient.  Yes   Patient/family informed of [facility]'s ownership interest in [facility] and [facility], as well as of the fact that they are under no obligation to receive care at these facilities.  PASRR submitted to EDS on       PASRR number received on       Existing PASRR number confirmed on 10/01/18     FL2 transmitted to all facilities in geographic area requested by pt/family on       FL2 transmitted to all facilities within larger geographic area on 08/23/18     Patient informed that his/her managed care company has contracts with or will negotiate with certain facilities, including the following:        Yes   Patient/family informed of bed offers received.  Patient chooses bed at [facility]     Physician recommends and patient chooses bed at      Patient to be transferred to [facility] on  10/01/18.  Patient to be transferred to facility by [facility]     Patient family notified on 10/01/18 of transfer.  Name of family member notified:  Patient     PHYSICIAN Please sign DNR, Please prepare prescriptions     Additional Comment:    _______________________________________________ Pricilla Holm, [place] 10/01/2018, 12:38 PM

## 2018-10-01 NOTE — Care Management Note (Signed)
Case Management Note  Patient Details  Name: [name] MRN: [id number] Date of Birth: 03-13-1941  Subjective/Objective:                  Patient discharging to local snf  Action/Plan: No cm needs present  Expected Discharge Date:  10/01/18               Expected Discharge Plan:  Skilled Nursing Facility  In-House Referral:  Clinical Social Work  Discharge planning Services  CM Consult  Post Acute Care Choice:  NA Choice offered to:  NA  DME Arranged:  N/A DME Agency:  NA  HH Arranged:  NA HH Agency:  NA  Status of Service:  Completed, signed off  If discussed at [employer], dates discussed:    Additional Comments:  Leeroy Cha, RN 10/01/2018, 1:07 PM

## 2018-10-01 NOTE — Progress Notes (Signed)
[facility] for Apixaban Indication: pulmonary embolus and DVT  Allergies  Allergen Reactions  . Ace Inhibitors Cough    With Lisinopril.  . Angiotensin Receptor Blockers Other (See Comments)    Caused excessive weight gain  . Metformin Diarrhea    Patient Measurements: Height: 6' (182.9 cm) Weight: (!) 309 lb 11.9 oz (140.5 kg) IBW/kg (Calculated) : 77.6   Vital Signs: Temp: 97.6 F (36.4 C) (10/03 1150) Temp Source: Oral (10/03 1150) BP: 175/59 (10/03 1135) Pulse Rate: 74 (10/03 1135)  Labs: Recent Labs    09/29/18 1332 10/01/18 0842  HGB  --  8.6*  HCT  --  28.8*  PLT  --  252  CREATININE 0.75 0.65    Estimated Creatinine Clearance: 110.7 mL/min (by C-G formula based on SCr of 0.65 mg/dL).   . sodium chloride 250 mL (10/01/18 1011)  . cefTRIAXone (ROCEPHIN)  IV Stopped (10/01/18 1045)   Assessment: Patient is a 77 year old male with a prolonged hospitalization, admitted on 8/21 for GI bleed, then complicated by a PE and UE DVT.  He was started on heparin drip and tolerated it, but after being converted to apixaban on 9/6 PM, patient developed GI bleeding requiring Andexxa reversal and embolization on 9/7.  Repeat EGD showed no new bleeding on 9/10.  Heparin drip was resumed on 9/16 for recent PE and new RUE DVT related to PICC line.  He has tolerated the Heparin gtt for ~3.5 days.  Pharmacy then consulted to transition from Heparin back to Apixaban on 9/20. Per discussion with MD on 9/20, patient and MD are aware of risks of resuming the same DOAC, apixaban s/p GIB requiring Andexxa reversal.  Today, 10/01/2018:  CBC:  Hgb remains low/stable at 8.6, Pltc remains WNL  SCr improved further to 0.65, CrCl >100 ml/min.  No dose adjustment required.  RN reports no bleeding or complications, no blood in BM.  Regular BMs.  Goal of Therapy:  Therapeutic anticoagulation, prevention of recurrent VTE Absence of bleeding  complications  Monitor platelets by anticoagulation protocol: Yes   Plan:   Continue Apixaban 5mg  PO BID   Monitor CBC daily  Monitor for s/sx of bleeding.    Gretta Arab PharmD, BCPS Pager [phone] 10/01/2018 12:46 PM

## 2018-10-02 DIAGNOSIS — I5043 Acute on chronic combined systolic (congestive) and diastolic (congestive) heart failure: Secondary | ICD-10-CM | POA: Diagnosis not present

## 2018-10-02 DIAGNOSIS — I10 Essential (primary) hypertension: Secondary | ICD-10-CM | POA: Diagnosis not present

## 2018-10-02 DIAGNOSIS — J9621 Acute and chronic respiratory failure with hypoxia: Secondary | ICD-10-CM | POA: Diagnosis not present

## 2018-10-02 DIAGNOSIS — I2699 Other pulmonary embolism without acute cor pulmonale: Secondary | ICD-10-CM | POA: Diagnosis not present

## 2018-10-02 LAB — CULTURE, BLOOD (ROUTINE X 2)
CULTURE: NO GROWTH
Culture: NO GROWTH
Special Requests: ADEQUATE
Special Requests: ADEQUATE

## 2018-10-07 DIAGNOSIS — I5022 Chronic systolic (congestive) heart failure: Secondary | ICD-10-CM | POA: Diagnosis not present

## 2018-10-07 DIAGNOSIS — J069 Acute upper respiratory infection, unspecified: Secondary | ICD-10-CM | POA: Diagnosis not present

## 2018-10-07 DIAGNOSIS — R609 Edema, unspecified: Secondary | ICD-10-CM | POA: Diagnosis not present

## 2018-10-07 DIAGNOSIS — I8291 Chronic embolism and thrombosis of unspecified vein: Secondary | ICD-10-CM | POA: Diagnosis not present

## 2018-10-12 DIAGNOSIS — I8291 Chronic embolism and thrombosis of unspecified vein: Secondary | ICD-10-CM | POA: Diagnosis not present

## 2018-10-12 DIAGNOSIS — I5023 Acute on chronic systolic (congestive) heart failure: Secondary | ICD-10-CM | POA: Diagnosis not present

## 2018-10-12 DIAGNOSIS — E876 Hypokalemia: Secondary | ICD-10-CM | POA: Diagnosis not present

## 2018-10-12 DIAGNOSIS — R609 Edema, unspecified: Secondary | ICD-10-CM | POA: Diagnosis not present

## 2018-10-14 ENCOUNTER — Emergency Department (HOSPITAL_COMMUNITY): Payer: PPO

## 2018-10-14 ENCOUNTER — Inpatient Hospital Stay (HOSPITAL_COMMUNITY)
Admission: EM | Admit: 2018-10-14 | Discharge: 2018-10-17 | DRG: 291 | Disposition: A | Discharge status: Skilled Nursing Facility | Payer: PPO | Source: Skilled Nursing Facility | Attending: Family Medicine | Admitting: Family Medicine

## 2018-10-14 ENCOUNTER — Encounter (HOSPITAL_COMMUNITY): Payer: Self-pay | Admitting: *Deleted

## 2018-10-14 ENCOUNTER — Inpatient Hospital Stay (HOSPITAL_COMMUNITY): Payer: PPO

## 2018-10-14 ENCOUNTER — Other Ambulatory Visit: Payer: Self-pay

## 2018-10-14 ENCOUNTER — Other Ambulatory Visit: Payer: Self-pay | Admitting: *Deleted

## 2018-10-14 DIAGNOSIS — L89213 Pressure ulcer of right hip, stage 3: Secondary | ICD-10-CM | POA: Diagnosis present

## 2018-10-14 DIAGNOSIS — K08409 Partial loss of teeth, unspecified cause, unspecified class: Secondary | ICD-10-CM | POA: Diagnosis not present

## 2018-10-14 DIAGNOSIS — Z96643 Presence of artificial hip joint, bilateral: Secondary | ICD-10-CM | POA: Diagnosis present

## 2018-10-14 DIAGNOSIS — Z79899 Other long term (current) drug therapy: Secondary | ICD-10-CM

## 2018-10-14 DIAGNOSIS — I110 Hypertensive heart disease with heart failure: Principal | ICD-10-CM | POA: Diagnosis present

## 2018-10-14 DIAGNOSIS — Z8719 Personal history of other diseases of the digestive system: Secondary | ICD-10-CM

## 2018-10-14 DIAGNOSIS — Z7901 Long term (current) use of anticoagulants: Secondary | ICD-10-CM

## 2018-10-14 DIAGNOSIS — E876 Hypokalemia: Secondary | ICD-10-CM

## 2018-10-14 DIAGNOSIS — Z794 Long term (current) use of insulin: Secondary | ICD-10-CM

## 2018-10-14 DIAGNOSIS — Z8701 Personal history of pneumonia (recurrent): Secondary | ICD-10-CM

## 2018-10-14 DIAGNOSIS — M2550 Pain in unspecified joint: Secondary | ICD-10-CM | POA: Diagnosis not present

## 2018-10-14 DIAGNOSIS — D649 Anemia, unspecified: Secondary | ICD-10-CM | POA: Diagnosis present

## 2018-10-14 DIAGNOSIS — R0602 Shortness of breath: Secondary | ICD-10-CM

## 2018-10-14 DIAGNOSIS — Z9989 Dependence on other enabling machines and devices: Secondary | ICD-10-CM | POA: Diagnosis not present

## 2018-10-14 DIAGNOSIS — E2740 Unspecified adrenocortical insufficiency: Secondary | ICD-10-CM | POA: Diagnosis not present

## 2018-10-14 DIAGNOSIS — J9621 Acute and chronic respiratory failure with hypoxia: Secondary | ICD-10-CM | POA: Diagnosis not present

## 2018-10-14 DIAGNOSIS — I82891 Chronic embolism and thrombosis of other specified veins: Secondary | ICD-10-CM | POA: Diagnosis not present

## 2018-10-14 DIAGNOSIS — J9601 Acute respiratory failure with hypoxia: Secondary | ICD-10-CM

## 2018-10-14 DIAGNOSIS — Z7409 Other reduced mobility: Secondary | ICD-10-CM | POA: Diagnosis not present

## 2018-10-14 DIAGNOSIS — E1142 Type 2 diabetes mellitus with diabetic polyneuropathy: Secondary | ICD-10-CM | POA: Diagnosis not present

## 2018-10-14 DIAGNOSIS — R5381 Other malaise: Secondary | ICD-10-CM | POA: Diagnosis not present

## 2018-10-14 DIAGNOSIS — I10 Essential (primary) hypertension: Secondary | ICD-10-CM | POA: Diagnosis not present

## 2018-10-14 DIAGNOSIS — Z888 Allergy status to other drugs, medicaments and biological substances status: Secondary | ICD-10-CM

## 2018-10-14 DIAGNOSIS — Z8546 Personal history of malignant neoplasm of prostate: Secondary | ICD-10-CM

## 2018-10-14 DIAGNOSIS — J90 Pleural effusion, not elsewhere classified: Secondary | ICD-10-CM | POA: Diagnosis not present

## 2018-10-14 DIAGNOSIS — Z86711 Personal history of pulmonary embolism: Secondary | ICD-10-CM

## 2018-10-14 DIAGNOSIS — R748 Abnormal levels of other serum enzymes: Secondary | ICD-10-CM | POA: Diagnosis not present

## 2018-10-14 DIAGNOSIS — I890 Lymphedema, not elsewhere classified: Secondary | ICD-10-CM | POA: Diagnosis not present

## 2018-10-14 DIAGNOSIS — Z8249 Family history of ischemic heart disease and other diseases of the circulatory system: Secondary | ICD-10-CM

## 2018-10-14 DIAGNOSIS — R031 Nonspecific low blood-pressure reading: Secondary | ICD-10-CM | POA: Diagnosis not present

## 2018-10-14 DIAGNOSIS — J80 Acute respiratory distress syndrome: Secondary | ICD-10-CM | POA: Diagnosis not present

## 2018-10-14 DIAGNOSIS — E874 Mixed disorder of acid-base balance: Secondary | ICD-10-CM | POA: Diagnosis not present

## 2018-10-14 DIAGNOSIS — M86661 Other chronic osteomyelitis, right tibia and fibula: Secondary | ICD-10-CM | POA: Diagnosis not present

## 2018-10-14 DIAGNOSIS — G4733 Obstructive sleep apnea (adult) (pediatric): Secondary | ICD-10-CM | POA: Diagnosis not present

## 2018-10-14 DIAGNOSIS — E873 Alkalosis: Secondary | ICD-10-CM | POA: Diagnosis not present

## 2018-10-14 DIAGNOSIS — E440 Moderate protein-calorie malnutrition: Secondary | ICD-10-CM | POA: Diagnosis not present

## 2018-10-14 DIAGNOSIS — E785 Hyperlipidemia, unspecified: Secondary | ICD-10-CM | POA: Diagnosis present

## 2018-10-14 DIAGNOSIS — R001 Bradycardia, unspecified: Secondary | ICD-10-CM | POA: Diagnosis not present

## 2018-10-14 DIAGNOSIS — R109 Unspecified abdominal pain: Secondary | ICD-10-CM | POA: Diagnosis not present

## 2018-10-14 DIAGNOSIS — Z981 Arthrodesis status: Secondary | ICD-10-CM | POA: Diagnosis not present

## 2018-10-14 DIAGNOSIS — Z7401 Bed confinement status: Secondary | ICD-10-CM | POA: Diagnosis not present

## 2018-10-14 DIAGNOSIS — Z6841 Body Mass Index (BMI) 40.0 and over, adult: Secondary | ICD-10-CM

## 2018-10-14 DIAGNOSIS — Z23 Encounter for immunization: Secondary | ICD-10-CM | POA: Diagnosis not present

## 2018-10-14 DIAGNOSIS — D72829 Elevated white blood cell count, unspecified: Secondary | ICD-10-CM | POA: Diagnosis not present

## 2018-10-14 DIAGNOSIS — I5023 Acute on chronic systolic (congestive) heart failure: Secondary | ICD-10-CM | POA: Diagnosis not present

## 2018-10-14 DIAGNOSIS — Z452 Encounter for adjustment and management of vascular access device: Secondary | ICD-10-CM | POA: Diagnosis not present

## 2018-10-14 DIAGNOSIS — K269 Duodenal ulcer, unspecified as acute or chronic, without hemorrhage or perforation: Secondary | ICD-10-CM | POA: Diagnosis not present

## 2018-10-14 DIAGNOSIS — I2699 Other pulmonary embolism without acute cor pulmonale: Secondary | ICD-10-CM | POA: Diagnosis not present

## 2018-10-14 DIAGNOSIS — E870 Hyperosmolality and hypernatremia: Secondary | ICD-10-CM | POA: Diagnosis not present

## 2018-10-14 DIAGNOSIS — R0902 Hypoxemia: Secondary | ICD-10-CM

## 2018-10-14 DIAGNOSIS — K219 Gastro-esophageal reflux disease without esophagitis: Secondary | ICD-10-CM | POA: Diagnosis not present

## 2018-10-14 DIAGNOSIS — J9622 Acute and chronic respiratory failure with hypercapnia: Secondary | ICD-10-CM | POA: Diagnosis present

## 2018-10-14 DIAGNOSIS — R195 Other fecal abnormalities: Secondary | ICD-10-CM | POA: Diagnosis not present

## 2018-10-14 DIAGNOSIS — E1140 Type 2 diabetes mellitus with diabetic neuropathy, unspecified: Secondary | ICD-10-CM | POA: Diagnosis not present

## 2018-10-14 DIAGNOSIS — J449 Chronic obstructive pulmonary disease, unspecified: Secondary | ICD-10-CM | POA: Diagnosis not present

## 2018-10-14 DIAGNOSIS — R197 Diarrhea, unspecified: Secondary | ICD-10-CM | POA: Diagnosis not present

## 2018-10-14 DIAGNOSIS — I5033 Acute on chronic diastolic (congestive) heart failure: Secondary | ICD-10-CM | POA: Diagnosis present

## 2018-10-14 DIAGNOSIS — R1084 Generalized abdominal pain: Secondary | ICD-10-CM | POA: Diagnosis not present

## 2018-10-14 DIAGNOSIS — M79606 Pain in leg, unspecified: Secondary | ICD-10-CM | POA: Diagnosis not present

## 2018-10-14 DIAGNOSIS — R0689 Other abnormalities of breathing: Secondary | ICD-10-CM

## 2018-10-14 DIAGNOSIS — L89150 Pressure ulcer of sacral region, unstageable: Secondary | ICD-10-CM | POA: Diagnosis not present

## 2018-10-14 DIAGNOSIS — L89890 Pressure ulcer of other site, unstageable: Secondary | ICD-10-CM | POA: Diagnosis not present

## 2018-10-14 DIAGNOSIS — L89893 Pressure ulcer of other site, stage 3: Secondary | ICD-10-CM | POA: Diagnosis not present

## 2018-10-14 DIAGNOSIS — E119 Type 2 diabetes mellitus without complications: Secondary | ICD-10-CM

## 2018-10-14 DIAGNOSIS — I255 Ischemic cardiomyopathy: Secondary | ICD-10-CM | POA: Diagnosis present

## 2018-10-14 DIAGNOSIS — R609 Edema, unspecified: Secondary | ICD-10-CM | POA: Diagnosis not present

## 2018-10-14 DIAGNOSIS — J181 Lobar pneumonia, unspecified organism: Secondary | ICD-10-CM | POA: Diagnosis not present

## 2018-10-14 DIAGNOSIS — Z7951 Long term (current) use of inhaled steroids: Secondary | ICD-10-CM

## 2018-10-14 DIAGNOSIS — M6281 Muscle weakness (generalized): Secondary | ICD-10-CM | POA: Diagnosis not present

## 2018-10-14 DIAGNOSIS — I8291 Chronic embolism and thrombosis of unspecified vein: Secondary | ICD-10-CM | POA: Diagnosis not present

## 2018-10-14 DIAGNOSIS — Z8711 Personal history of peptic ulcer disease: Secondary | ICD-10-CM

## 2018-10-14 DIAGNOSIS — K922 Gastrointestinal hemorrhage, unspecified: Secondary | ICD-10-CM | POA: Diagnosis not present

## 2018-10-14 LAB — COMPREHENSIVE METABOLIC PANEL
ALT: 15 U/L (ref 0–44)
ANION GAP: 14 (ref 5–15)
AST: 15 U/L (ref 15–41)
Albumin: 2.7 g/dL — ABNORMAL LOW (ref 3.5–5.0)
Alkaline Phosphatase: 138 U/L — ABNORMAL HIGH (ref 38–126)
BUN: 23 mg/dL (ref 8–23)
CHLORIDE: 93 mmol/L — AB (ref 98–111)
CO2: 45 mmol/L — ABNORMAL HIGH (ref 22–32)
Calcium: 8.8 mg/dL — ABNORMAL LOW (ref 8.9–10.3)
Creatinine, Ser: 1.01 mg/dL (ref 0.61–1.24)
Glucose, Bld: 165 mg/dL — ABNORMAL HIGH (ref 70–99)
POTASSIUM: 2.5 mmol/L — AB (ref 3.5–5.1)
Sodium: 152 mmol/L — ABNORMAL HIGH (ref 135–145)
Total Bilirubin: 0.7 mg/dL (ref 0.3–1.2)
Total Protein: 7 g/dL (ref 6.5–8.1)

## 2018-10-14 LAB — CBC WITH DIFFERENTIAL/PLATELET
Abs Immature Granulocytes: 0.05 10*3/uL (ref 0.00–0.07)
BASOS ABS: 0 10*3/uL (ref 0.0–0.1)
Basophils Relative: 0 %
EOS ABS: 0.1 10*3/uL (ref 0.0–0.5)
EOS PCT: 1 %
HCT: 31.1 % — ABNORMAL LOW (ref 39.0–52.0)
Hemoglobin: 8.9 g/dL — ABNORMAL LOW (ref 13.0–17.0)
Immature Granulocytes: 1 %
LYMPHS PCT: 9 %
Lymphs Abs: 0.6 10*3/uL — ABNORMAL LOW (ref 0.7–4.0)
MCH: 27.8 pg (ref 26.0–34.0)
MCHC: 28.6 g/dL — ABNORMAL LOW (ref 30.0–36.0)
MCV: 97.2 fL (ref 80.0–100.0)
Monocytes Absolute: 0.4 10*3/uL (ref 0.1–1.0)
Monocytes Relative: 6 %
NEUTROS PCT: 83 %
NRBC: 0 % (ref 0.0–0.2)
Neutro Abs: 5.8 10*3/uL (ref 1.7–7.7)
Platelets: 241 10*3/uL (ref 150–400)
RBC: 3.2 MIL/uL — AB (ref 4.22–5.81)
RDW: 15.5 % (ref 11.5–15.5)
WBC: 7 10*3/uL (ref 4.0–10.5)

## 2018-10-14 LAB — MAGNESIUM: Magnesium: 1.5 mg/dL — ABNORMAL LOW (ref 1.7–2.4)

## 2018-10-14 LAB — TYPE AND SCREEN
ABO/RH(D): O POS
Antibody Screen: NEGATIVE

## 2018-10-14 LAB — TROPONIN I

## 2018-10-14 LAB — PROCALCITONIN

## 2018-10-14 LAB — POC OCCULT BLOOD, ED: Fecal Occult Bld: POSITIVE — AB

## 2018-10-14 LAB — GLUCOSE, CAPILLARY: Glucose-Capillary: 141 mg/dL — ABNORMAL HIGH (ref 70–99)

## 2018-10-14 LAB — BRAIN NATRIURETIC PEPTIDE: B NATRIURETIC PEPTIDE 5: 132.8 pg/mL — AB (ref 0.0–100.0)

## 2018-10-14 MED ORDER — FAMOTIDINE 20 MG PO TABS
40.0000 mg | ORAL_TABLET | Freq: Every day | ORAL | Status: DC
  Administered 2018-10-15 – 2018-10-17 (×3): 40 mg via ORAL
  Filled 2018-10-14 (×3): qty 2

## 2018-10-14 MED ORDER — POLYETHYLENE GLYCOL 3350 17 G PO PACK: 17.0000 g | PACK | Freq: Every day | ORAL | Status: DC | PRN

## 2018-10-14 MED ORDER — ACETAMINOPHEN 325 MG PO TABS: 650.0000 mg | ORAL_TABLET | Freq: Four times a day (QID) | ORAL | Status: DC | PRN

## 2018-10-14 MED ORDER — BUDESONIDE 0.5 MG/2ML IN SUSP
0.5000 mg | Freq: Two times a day (BID) | RESPIRATORY_TRACT | Status: DC
  Administered 2018-10-14 – 2018-10-17 (×6): 0.5 mg via RESPIRATORY_TRACT
  Filled 2018-10-14 (×7): qty 2

## 2018-10-14 MED ORDER — ONDANSETRON HCL 4 MG PO TABS: 4.0000 mg | ORAL_TABLET | Freq: Four times a day (QID) | ORAL | Status: DC | PRN

## 2018-10-14 MED ORDER — METOPROLOL TARTRATE 12.5 MG HALF TABLET
12.5000 mg | ORAL_TABLET | Freq: Two times a day (BID) | ORAL | Status: DC
  Administered 2018-10-14 – 2018-10-17 (×6): 12.5 mg via ORAL
  Filled 2018-10-14 (×6): qty 1

## 2018-10-14 MED ORDER — FUROSEMIDE 10 MG/ML IJ SOLN
40.0000 mg | Freq: Two times a day (BID) | INTRAMUSCULAR | Status: DC
  Administered 2018-10-15: 40 mg via INTRAVENOUS
  Filled 2018-10-14: qty 4

## 2018-10-14 MED ORDER — FERROUS SULFATE 325 (65 FE) MG PO TABS
325.0000 mg | ORAL_TABLET | Freq: Two times a day (BID) | ORAL | Status: DC
  Administered 2018-10-15 – 2018-10-17 (×6): 325 mg via ORAL
  Filled 2018-10-14 (×6): qty 1

## 2018-10-14 MED ORDER — MAGNESIUM SULFATE 50 % IJ SOLN
1.0000 g | Freq: Once | INTRAMUSCULAR | Status: DC
  Filled 2018-10-14: qty 2

## 2018-10-14 MED ORDER — FUROSEMIDE 10 MG/ML IJ SOLN
40.0000 mg | Freq: Once | INTRAMUSCULAR | Status: AC
  Administered 2018-10-14: 40 mg via INTRAVENOUS
  Filled 2018-10-14: qty 4

## 2018-10-14 MED ORDER — ONDANSETRON HCL 4 MG/2ML IJ SOLN: 4.0000 mg | Freq: Four times a day (QID) | INTRAMUSCULAR | Status: DC | PRN

## 2018-10-14 MED ORDER — POTASSIUM CHLORIDE 10 MEQ/100ML IV SOLN
10.0000 meq | INTRAVENOUS | Status: AC
  Administered 2018-10-14 – 2018-10-15 (×4): 10 meq via INTRAVENOUS
  Filled 2018-10-14 (×4): qty 100

## 2018-10-14 MED ORDER — GUAIFENESIN 100 MG/5ML PO SOLN
200.0000 mg | Freq: Three times a day (TID) | ORAL | Status: DC | PRN
  Administered 2018-10-15 – 2018-10-16 (×4): 200 mg via ORAL
  Filled 2018-10-14 (×5): qty 10

## 2018-10-14 MED ORDER — MAGNESIUM SULFATE IN D5W 1-5 GM/100ML-% IV SOLN
1.0000 g | Freq: Once | INTRAVENOUS | Status: AC
  Administered 2018-10-15: 1 g via INTRAVENOUS
  Filled 2018-10-14: qty 100

## 2018-10-14 MED ORDER — CHLORHEXIDINE GLUCONATE 0.12 % MT SOLN
15.0000 mL | Freq: Two times a day (BID) | OROMUCOSAL | Status: DC
  Administered 2018-10-15 – 2018-10-17 (×3): 15 mL via OROMUCOSAL
  Filled 2018-10-14 (×3): qty 15

## 2018-10-14 MED ORDER — ORAL CARE MOUTH RINSE
15.0000 mL | Freq: Two times a day (BID) | OROMUCOSAL | Status: DC
  Administered 2018-10-17: 15 mL via OROMUCOSAL

## 2018-10-14 MED ORDER — APIXABAN 5 MG PO TABS
5.0000 mg | ORAL_TABLET | Freq: Two times a day (BID) | ORAL | Status: DC
  Administered 2018-10-14 – 2018-10-17 (×6): 5 mg via ORAL
  Filled 2018-10-14 (×6): qty 1

## 2018-10-14 MED ORDER — HYDROCORTISONE 10 MG PO TABS
10.0000 mg | ORAL_TABLET | Freq: Every day | ORAL | Status: DC
  Administered 2018-10-15 – 2018-10-16 (×2): 10 mg via ORAL
  Filled 2018-10-14 (×2): qty 1

## 2018-10-14 MED ORDER — ACETAMINOPHEN 650 MG RE SUPP: 650.0000 mg | Freq: Four times a day (QID) | RECTAL | Status: DC | PRN

## 2018-10-14 MED ORDER — PRAVASTATIN SODIUM 40 MG PO TABS
40.0000 mg | ORAL_TABLET | Freq: Every day | ORAL | Status: DC
  Administered 2018-10-15 – 2018-10-17 (×3): 40 mg via ORAL
  Filled 2018-10-14: qty 2
  Filled 2018-10-14: qty 1
  Filled 2018-10-14: qty 2

## 2018-10-14 MED ORDER — TAMSULOSIN HCL 0.4 MG PO CAPS
0.8000 mg | ORAL_CAPSULE | Freq: Every day | ORAL | Status: DC
  Administered 2018-10-15 – 2018-10-17 (×3): 0.8 mg via ORAL
  Filled 2018-10-14 (×3): qty 2

## 2018-10-14 MED ORDER — ALBUTEROL SULFATE HFA 108 (90 BASE) MCG/ACT IN AERS: 2.0000 | INHALATION_SPRAY | RESPIRATORY_TRACT | Status: DC | PRN

## 2018-10-14 MED ORDER — LATANOPROST 0.005 % OP SOLN
1.0000 [drp] | Freq: Every day | OPHTHALMIC | Status: DC
  Administered 2018-10-14 – 2018-10-16 (×3): 1 [drp] via OPHTHALMIC
  Filled 2018-10-14 (×2): qty 2.5

## 2018-10-14 MED ORDER — INSULIN ASPART 100 UNIT/ML ~~LOC~~ SOLN
0.0000 [IU] | Freq: Three times a day (TID) | SUBCUTANEOUS | Status: DC
  Administered 2018-10-15: 2 [IU] via SUBCUTANEOUS
  Administered 2018-10-15: 3 [IU] via SUBCUTANEOUS
  Administered 2018-10-16: 5 [IU] via SUBCUTANEOUS
  Administered 2018-10-16: 3 [IU] via SUBCUTANEOUS
  Administered 2018-10-17: 2 [IU] via SUBCUTANEOUS
  Administered 2018-10-17: 3 [IU] via SUBCUTANEOUS
  Administered 2018-10-17: 2 [IU] via SUBCUTANEOUS

## 2018-10-14 MED ORDER — PANTOPRAZOLE SODIUM 40 MG PO TBEC
40.0000 mg | DELAYED_RELEASE_TABLET | Freq: Two times a day (BID) | ORAL | Status: DC
  Administered 2018-10-15 – 2018-10-17 (×6): 40 mg via ORAL
  Filled 2018-10-14 (×6): qty 1

## 2018-10-14 MED ORDER — SUCRALFATE 1 G PO TABS
1.0000 g | ORAL_TABLET | Freq: Three times a day (TID) | ORAL | Status: DC
  Administered 2018-10-14 – 2018-10-17 (×12): 1 g via ORAL
  Filled 2018-10-14 (×12): qty 1

## 2018-10-14 MED ORDER — FLUTICASONE PROPIONATE 50 MCG/ACT NA SUSP
2.0000 | Freq: Every day | NASAL | Status: DC
  Administered 2018-10-15 – 2018-10-16 (×2): 2 via NASAL
  Filled 2018-10-14: qty 16

## 2018-10-14 MED ORDER — ALBUTEROL SULFATE (2.5 MG/3ML) 0.083% IN NEBU: 2.5000 mg | INHALATION_SOLUTION | RESPIRATORY_TRACT | Status: DC | PRN

## 2018-10-14 MED ORDER — FLUTICASONE PROPIONATE HFA 220 MCG/ACT IN AERO: 1.0000 | INHALATION_SPRAY | Freq: Two times a day (BID) | RESPIRATORY_TRACT | Status: DC

## 2018-10-14 MED ORDER — COLLAGENASE 250 UNIT/GM EX OINT
1.0000 "application " | TOPICAL_OINTMENT | Freq: Every day | CUTANEOUS | Status: DC
  Administered 2018-10-15 – 2018-10-17 (×3): 1 via TOPICAL
  Filled 2018-10-14: qty 90

## 2018-10-14 NOTE — H&P (Addendum)
History and Physical    [name] QZR:[id number] DOB: 1941-08-11 DOA: 10/14/2018  PCP: [place] Bing, DO   Patient coming from: SNF  I have personally briefly reviewed patient's old medical records in [place]  Chief Complaint: SOB  HPI: [name] is a 77 y.o. male with medical history significant of  CDM, DM, GIB, PE reports with Sob and Hypoxia at SNF.  Really reported O2 sats dropping to 50% was placed on a nonrebreather.  When EMS arrived arrived his SPO2 is back to 100%.  Patient complained of a cough with some mild sputum production.  He denies any fever.  Patient has chronic edema in his legs unsure of they have changed.  He states he just started doing his rehab.  Patient has a history of pulmonary embolism and is on Eliquis.  She was admitted in August for a long stent for a GI bleed due to a duodenal ulcer while on anticoagulation.  Was collocated by anemia requiring transfusion as well as hypotension requiring reversal of his anticoagulation and a stay in the ICU. ED Course:  CXR unequivocal.  He was afebrile here and did not have a leukocytosis.  Given IV lasix and K+. Placed on Bipap with improvement in WOB.  Patient was heme positive here but there was no frank melena.  Hemoglobin stable at 8.9.  Review of Systems: SOB, diarrhea, cough with sputum All others reviewed with patient  and are  negative unless otherwise stated   Past Medical History:  Diagnosis Date  . Chronic osteomyelitis of lower leg (HCC) 11/20/2010   Chronic osteoarthritis of right lower leg-10/03/10 note by Dr. Harvie Heck scanned of this visit under media tab. Dr. Sharol Given states that this is chronic osteomyelitis, only cure would be amputation. Patient was not agreeable to this at his visit with Dr. Sharol Given. Patient elected to apply Bactroban cream, Dr. Sharol Given and time of that visit also prescribe doxycycline. Dr. Sharol Given stay the patient she come to the [facility] 02/26/2007   Qualifier:  Diagnosis of  By: Erling Cruz  MD, [place]    . Complication of anesthesia   . Diabetes mellitus   . Diabetic peripheral neuropathy associated with type 2 diabetes mellitus ([place]) 02/07/2014  . GERD (gastroesophageal reflux disease)   . Grade II diastolic dysfunction   . Heart murmur    last 2D Echo -03/31/08  . HIATAL HERNIA WITH REFLUX 09/29/2006   Qualifier: Diagnosis of  By: Erling Cruz  MD, MELISSA    . HIP REPLACEMENT, BILATERAL, HX OF 05/12/2007   Qualifier: Diagnosis of  By: Erling Cruz  MD, MELISSA    . History of blood transfusion   . Hypertension   . OSTEOARTHRITIS 03/05/2007   Qualifier: Diagnosis of  By: Erling Cruz  MD, MELISSA    . Paget's disease of bony pelvis   . PONV (postoperative nausea and vomiting)   . Prostate cancer ([place])   . SBO (small bowel obstruction) ([place]) 02/2017  . Seasonal allergies   . VENOUS INSUFFICIENCY, CHRONIC 02/26/2007   Qualifier: Diagnosis of  By: Erling Cruz  MD, MELISSA   Duodenal ulcer    Past Surgical History:  Procedure Laterality Date  . COLONOSCOPY    . COLONOSCOPY    . ESOPHAGOGASTRODUODENOSCOPY (EGD) WITH PROPOFOL N/A 08/27/2018   Procedure: ESOPHAGOGASTRODUODENOSCOPY (EGD) WITH PROPOFOL HEMOSPRAY ;  Surgeon: Otis Brace, MD;  Location: [facility];  Service: Gastroenterology;  Laterality: N/A;  bedside EGD  . ESOPHAGOGASTRODUODENOSCOPY (EGD) WITH PROPOFOL N/A 09/08/2018  Procedure: ESOPHAGOGASTRODUODENOSCOPY (EGD) WITH PROPOFOL;  Surgeon: Clarene Essex, MD;  Location: [facility];  Service: Endoscopy;  Laterality: N/A;  . HIP ARTHROPLASTY     bil  . HOT HEMOSTASIS N/A 08/27/2018   Procedure: HOT HEMOSTASIS (ARGON PLASMA COAGULATION/BICAP);  Surgeon: Otis Brace, MD;  Location: [facility];  Service: Gastroenterology;  Laterality: N/A;  bicap  . I&D EXTREMITY  08/14/2012   Procedure: IRRIGATION AND DEBRIDEMENT EXTREMITY;  Surgeon: Newt Minion, MD;  Location: [facility];  Service: Orthopedics;  Laterality: Right;  Irrigation and Debridement Right tibia, Placement  antibiotic beads  . I&D EXTREMITY Left 03/18/2014   Procedure: IRRIGATION AND DEBRIDEMENT EXTREMITY;  Surgeon: Newt Minion, MD;  Location: [facility];  Service: Orthopedics;  Laterality: Left;  Debridement Left Calf Ulcer, Apply Theraskin and Wound VAC  . IR ANGIOGRAM SELECTIVE EACH ADDITIONAL VESSEL  09/05/2018  . IR ANGIOGRAM SELECTIVE EACH ADDITIONAL VESSEL  09/05/2018  . IR ANGIOGRAM SELECTIVE EACH ADDITIONAL VESSEL  09/05/2018  . IR ANGIOGRAM VISCERAL SELECTIVE  09/05/2018  . IR ANGIOGRAM VISCERAL SELECTIVE  09/05/2018  . IR EMBO ART  VEN HEMORR LYMPH EXTRAV  INC GUIDE ROADMAPPING  09/05/2018  . IR US GUIDE VASC ACCESS RIGHT  09/05/2018  . IRRIGATION AND DEBRIDEMENT ABSCESS Left 03/18/2014   DR DUDA  . LEG SURGERY     ROD for fracture  . LEG WOUND REPAIR / CLOSURE     Beads .  Skin wound  . SKIN GRAFT     right leg- from donor skin  . SKIN GRAFT  1976   R wrist   skin graft from left thigh  . SUBMUCOSAL INJECTION  08/27/2018   Procedure: SUBMUCOSAL INJECTION;  Surgeon: Otis Brace, MD;  Location: [facility];  Service: Gastroenterology;;  . UPPER GASTROINTESTINAL ENDOSCOPY    . WISDOM TOOTH EXTRACTION    . WRIST FUSION     right wrist     reports that he has never smoked. He has never used smokeless tobacco. He reports that he does not drink alcohol or use drugs.  Allergies  Allergen Reactions  . Ace Inhibitors Cough    With Lisinopril.  . Angiotensin Receptor Blockers Other (See Comments)    Caused excessive weight gain  . Metformin Diarrhea    Family History  Problem Relation Age of Onset  . Hypertension Mother      Prior to Admission medications   Medication Sig Start Date End Date Taking? Authorizing Provider  Amino Acids-Protein Hydrolys (FEEDING SUPPLEMENT, PRO-STAT SUGAR FREE 64,) LIQD Take 30 mLs by mouth 2 (two) times daily. 09/29/18  Yes Hosie Poisson, MD  apixaban (ELIQUIS) 5 MG TABS tablet Take 1 tablet (5 mg total) by mouth 2 (two) times daily. 09/29/18  Yes Hosie Poisson, MD  collagenase (SANTYL) ointment Apply 1 application topically daily. Apply to right rear thigh topically every day shift for affwound care apply with calcium alginate   Yes [provider]  famotidine (PEPCID) 20 MG tablet Take 2 tablets (40 mg total) by mouth daily. 08/23/18  Yes Georgette Shell, MD  ferrous sulfate 325 (65 FE) MG tablet Take 1 tablet (325 mg total) by mouth 2 (two) times daily with a meal. 08/07/18  Yes Anderson, Chelsey L, DO  FLOVENT HFA 220 MCG/ACT inhaler INHALE 1 PUFF INTO THE LUNGS TWICE DAILY Patient taking differently: Inhale 1 puff into the lungs 2 (two) times daily.  10/02/17  Yes Mikell, Jeani Sow, MD  fluticasone (FLONASE) 50 MCG/ACT nasal spray Place  2 sprays into both nostrils daily.   Yes [provider]  furosemide (LASIX) 40 MG tablet Take 1 tablet (40 mg total) by mouth daily. 09/30/18  Yes Hosie Poisson, MD  guaiFENesin (ROBITUSSIN) 100 MG/5ML liquid Take 200 mg by mouth 3 (three) times daily as needed for cough. Take 80ml by mouth every 8 hours for cough/congestion for 7 days   Yes [provider]  hydrocortisone (CORTEF) 10 MG tablet Take 1 tablet (10 mg total) by mouth daily. 09/30/18  Yes Hosie Poisson, MD  Infant Care Products (DERMACLOUD) CREA Apply 1 application topically 3 (three) times daily.   Yes [provider]  insulin aspart (NOVOLOG) 100 UNIT/ML injection CBG 70 - 120: 0 units CBG 121 - 150: 2 units CBG 151 - 200: 3 units CBG 201 - 250: 5 units CBG 251 - 300: 8 units CBG 301 - 350: 11 units CBG 351 - 400: 15 units 09/29/18  Yes Hosie Poisson, MD  latanoprost (XALATAN) 0.005 % ophthalmic solution INSTILL 1 DROP IN BOTH EYES EVERY EVENING FOR GLAUCOMA Patient taking differently: Place 1 drop into both eyes at bedtime.  07/08/18  Yes [place] Bing, DO  metoprolol tartrate (LOPRESSOR) 25 MG tablet Take 0.5 tablets (12.5 mg total) by mouth 2 (two) times daily. 08/07/18  Yes Anderson, Chelsey L, DO    nystatin (MYCOSTATIN/NYSTOP) powder Apply topically 2 (two) times daily. 07/13/18  Yes [place] Bing, DO  pantoprazole (PROTONIX) 40 MG tablet Take 1 tablet (40 mg total) by mouth 2 (two) times daily before a meal. 09/29/18  Yes Hosie Poisson, MD  pravastatin (PRAVACHOL) 40 MG tablet TAKE 1 TABLET BY MOUTH DAILY Patient taking differently: Take 40 mg by mouth daily.  12/26/17  Yes Mikell, Jeani Sow, MD  PROAIR HFA 108 [phone] Base) MCG/ACT inhaler INHALE 2 PUFFS BY MOUTH EVERY 4 HOURS AS NEEDED FOR WHEEZING OR SHORTNESS OF BREATH Patient taking differently: Inhale 2 puffs into the lungs every 4 (four) hours as needed for wheezing or shortness of breath.  04/18/18  Yes Mikell, Jeani Sow, MD  sucralfate (CARAFATE) 1 g tablet Take 1 tablet (1 g total) by mouth 4 (four) times daily -  with meals and at bedtime. 09/29/18  Yes Hosie Poisson, MD  tamsulosin (FLOMAX) 0.4 MG CAPS capsule TAKE 2 CAPSULES BY MOUTH EVERY MORNING Patient taking differently: Take 0.8 mg by mouth daily.  07/08/18  Yes [place] Bing, DO  urea 10 % lotion Apply topically 2 (two) times daily. 08/07/18  Yes Anderson, Chelsey L, DO  Probiotic Product (PROBIOTIC PO) Take 2 capsules by mouth daily.    [provider]    Physical Exam: Vitals:   10/14/18 1858 10/14/18 1900 10/14/18 1930 10/14/18 2000  BP: (!) 144/69 (!) 151/62 (!) 145/76 138/88  Pulse: 69 68 68 71  Resp: 16 14 17  (!) 24  Temp:      TempSrc:      SpO2: 100% 100% 100% 97%  Weight:      Height:        Constitutional: NAD, calm, comfortable on BiPAP Vitals:   10/14/18 1858 10/14/18 1900 10/14/18 1930 10/14/18 2000  BP: (!) 144/69 (!) 151/62 (!) 145/76 138/88  Pulse: 69 68 68 71  Resp: 16 14 17  (!) 24  Temp:      TempSrc:      SpO2: 100% 100% 100% 97%  Weight:      Height:       Eyes: , lids and conjunctivae  normal ENMT: Mucous membranes are dry .  Neck: normal, supple, no masses,  Respiratory: clear to auscultation bilaterally decreased  in bases bilaterally,  Normal respiratory effort. No accessory muscle use.  Cardiovascular: Regular rate and slightly irregular rhythm, positive  murmurs chronic 2+ lower extremity edema with exophytic changes of lower extremity .  Mildly palpable pedal pulses Abdomen: no tenderness, no masses palpated. No hepatosplenomegaly. Bowel sounds positive.  morbid obese Musculoskeletal: no  cyanosis.  Decreased range of motion   Skin: Sacral and right thigh decubitus ulcers chronic Neurologic: Decreased range of motion of the upper and lower extremity but moves equally.    Psychiatric: Normal judgment and insight. Alert and oriented x 3. Normal mood.     Labs on Admission: I have personally reviewed following labs and imaging studies  CBC: Recent Labs  Lab 10/14/18 1733  WBC 7.0  NEUTROABS 5.8  HGB 8.9*  HCT 31.1*  MCV 97.2  PLT 818   Basic Metabolic Panel: Recent Labs  Lab 10/14/18 1733  NA 152*  K 2.5*  CL 93*  CO2 45*  GLUCOSE 165*  BUN 23  CREATININE 1.01  CALCIUM 8.8*   GFR: Estimated Creatinine Clearance: 87.6 mL/min (by C-G formula based on SCr of 1.01 mg/dL). Liver Function Tests: Recent Labs  Lab 10/14/18 1733  AST 15  ALT 15  ALKPHOS 138*  BILITOT 0.7  PROT 7.0  ALBUMIN 2.7*   No results for input(s): LIPASE, AMYLASE in the last 168 hours. No results for input(s): AMMONIA in the last 168 hours. Coagulation Profile: No results for input(s): INR, PROTIME in the last 168 hours. Cardiac Enzymes: Recent Labs  Lab 10/14/18 1733  TROPONINI <0.03   BNP (last 3 results) No results for input(s): PROBNP in the last 8760 hours. HbA1C: No results for input(s): HGBA1C in the last 72 hours. CBG: No results for input(s): GLUCAP in the last 168 hours. Lipid Profile: No results for input(s): CHOL, HDL, LDLCALC, TRIG, CHOLHDL, LDLDIRECT in the last 72 hours. Thyroid Function Tests: No results for input(s): TSH, T4TOTAL, FREET4, T3FREE, THYROIDAB in the last 72  hours. Anemia Panel: No results for input(s): VITAMINB12, FOLATE, FERRITIN, TIBC, IRON, RETICCTPCT in the last 72 hours. Urine analysis:    Component Value Date/Time   COLORURINE AMBER (A) 09/25/2018 1538   APPEARANCEUR TURBID (A) 09/25/2018 1538   LABSPEC 1.015 09/25/2018 1538   PHURINE 7.0 09/25/2018 1538   GLUCOSEU NEGATIVE 09/25/2018 1538   HGBUR MODERATE (A) 09/25/2018 1538   HGBUR negative 06/08/2007 1320   BILIRUBINUR NEGATIVE 09/25/2018 1538   BILIRUBINUR NEG 02/25/2012 1351   KETONESUR NEGATIVE 09/25/2018 1538   PROTEINUR 30 (A) 09/25/2018 1538   UROBILINOGEN 1.0 11/10/2012 2115   NITRITE NEGATIVE 09/25/2018 1538   LEUKOCYTESUR MODERATE (A) 09/25/2018 1538    Radiological Exams on Admission: Dg Chest Portable 1 View  Result Date: 10/14/2018 CLINICAL DATA:  Shortness of breath EXAM: PORTABLE CHEST 1 VIEW COMPARISON:  09/25/2018 FINDINGS: Haziness of the bilateral chest that is likely atelectasis and pleural fluid. Cardiomegaly and aortic tortuosity. No air bronchogram or cephalized blood flow. IMPRESSION: Hazy opacities at both bases, also seen 09/25/2018. This is likely atelectasis and pleural fluid, which was seen on a 09/02/2018 chest CT. There could be superimposed pneumonia. Cardiomegaly. Electronically Signed   By: Monte Fantasia M.D.   On: 10/14/2018 19:05    EKG: Independently reviewed. SR PAC  Assessment/Plan Principal Problem:   Acute respiratory failure with hypoxia (HCC) Active Problems:   Morbid  obesity with body mass index of 50.0-59.9 in adult [facility])   Essential hypertension   Pressure ulcer of upper thigh, right, stage III ([place]) present before admit   Hypernatremia   Hypokalemia   History of pulmonary embolism   History of duodenal ulcer 07/2018  DM2   -Ween Bipap as tolerated. ? Volume overload vs PNA. Check ct chest.  And procalcitonin.  On Eliquis for Hx of PE.  Hx  ischemic CDM. Echo 07/2018 showed EF 60-65%. Given iv lasix in ED. Daily weights,  strict I/O -K replaced IV In ED> check mg -Cont PPI. Hx recent duodenal ulcer. hgb stable at . Monitor -Cont BB for hx htn -Wound care consult  -Carb consistent diet, sliding scale insulin , check hemoglobin A1c, A1c =9 in May   DVT prophylaxis: Eliquis cont  Code Status: partial  Disposition Plan: SNF Admission status: IP SDU It is my clinical opinion that admission to INPATIENT is reasonable and necessary because of the expectation that this patient will require hospital care that crosses at least 2 midnights to treat this condition based on the medical complexity of the problems presented.     Shelbie Proctor MD [facility] Pager [phone]  If 7PM-7AM, please contact night-coverage [email or web address] Password [email or web address]  10/14/2018, 8:16 PM

## 2018-10-14 NOTE — Progress Notes (Signed)
Pt off BiPAP at this time.  Pt stable with vital signs charted while on 3Lpm nasal cannula.  Pt WOB is within normal limits.  Will continue to monitor as needed for BiPAP need

## 2018-10-14 NOTE — ED Notes (Signed)
Condom cath was placed upon arrival 

## 2018-10-14 NOTE — ED Notes (Signed)
Bed: MZ58 Expected date:  Expected time:  Means of arrival:  Comments: 68 initially SOB, no complaints at this time

## 2018-10-14 NOTE — ED Triage Notes (Addendum)
Pt to ED by [facility] with c/o of SOB. [facility] was called out by pt facility with complaints of pt O2 sat dropping to 50% so staff put patient on Non Rebreather at 15L and by the time [facility] arrived SpO2 was back to 100%. Pt stated that before his oxygen dropped, patient had multiple bowel movements and urine and was changed 4x moving around in the bed. Pt has hx of pneumonia 2 months ago and has stated that he has recently developed cough with white mucus production. No fevers. Pt has hx of  Diabetes type 2, HTN, morbid obesity. Pt has multiple wounds weeping and edematous legs on arrival. Pt states that the Doctor was his facility Monday and stated he had blood clots under liver and on arm on scan, but hadn't moved to chest and gave him symptoms to look out for.

## 2018-10-14 NOTE — ED Notes (Signed)
Pt being transported to CT scan and then going to 2W with respiratory and RN

## 2018-10-14 NOTE — ED Provider Notes (Signed)
Emergency Department Provider Note   I have reviewed the triage vital signs and the nursing notes.   HISTORY  Chief Complaint Shortness of Breath   HPI [name] is a 77 y.o. male with PMH of chronic osteomyelitis, DM, GERD, CHF, and HTN presents to the emergency department for evaluation after an episode of shortness of breath with hypoxemia.  Patient states he is currently at a rehab facility after his recent, prolonged, admission.  He had been doing a rehab exercises and returned to his room.  The patient states that he had multiple, nonbloody bowel movements x 4 and developed shortness of breath.  Patient states that the placed him on oxygen and symptoms continued.  He denies any chest pain or heart palpitations.  No fevers or chills.  He states he did develop a cough over the weekend is been compliant with his Lasix.    Past Medical History:  Diagnosis Date  . Chronic osteomyelitis of lower leg (HCC) 11/20/2010   Chronic osteoarthritis of right lower leg-10/03/10 note by Dr. Harvie Heck scanned of this visit under media tab. Dr. Sharol Given states that this is chronic osteomyelitis, only cure would be amputation. Patient was not agreeable to this at his visit with Dr. Sharol Given. Patient elected to apply Bactroban cream, Dr. Sharol Given and time of that visit also prescribe doxycycline. Dr. Sharol Given stay the patient she come to the [facility] 02/26/2007   Qualifier: Diagnosis of  By: Erling Cruz  MD, [place]    . Complication of anesthesia   . Diabetes mellitus   . Diabetic peripheral neuropathy associated with type 2 diabetes mellitus ([place]) 02/07/2014  . GERD (gastroesophageal reflux disease)   . Grade II diastolic dysfunction   . Heart murmur    last 2D Echo -03/31/08  . HIATAL HERNIA WITH REFLUX 09/29/2006   Qualifier: Diagnosis of  By: Erling Cruz  MD, MELISSA    . HIP REPLACEMENT, BILATERAL, HX OF 05/12/2007   Qualifier: Diagnosis of  By: Erling Cruz  MD, MELISSA    . History of blood transfusion   . Hypertension    . OSTEOARTHRITIS 03/05/2007   Qualifier: Diagnosis of  By: Erling Cruz  MD, MELISSA    . Paget's disease of bony pelvis   . PONV (postoperative nausea and vomiting)   . Prostate cancer ([place])   . SBO (small bowel obstruction) ([place]) 02/2017  . Seasonal allergies   . VENOUS INSUFFICIENCY, CHRONIC 02/26/2007   Qualifier: Diagnosis of  By: Erling Cruz  MD, MELISSA      Patient Active Problem List   Diagnosis Date Noted  . Acute respiratory failure with hypoxia ([employer]) 10/14/2018  . Hypernatremia 10/14/2018  . Hypokalemia 10/14/2018  . History of pulmonary embolism 10/14/2018  . History of duodenal ulcer 07/2018 10/14/2018  . Abdominal pain   . Leukocytosis   . Elevated alkaline phosphatase measurement   . Systolic CHF, acute on chronic ([place]) 09/11/2018  . Anasarca 09/11/2018  . Acute pulmonary embolism ([place]) 09/11/2018  . Acute respiratory failure ([place])   . Duodenal ulcer   . Malnutrition of moderate degree 08/24/2018  . Hemorrhagic shock ([place])   . Pressure injury of skin 08/20/2018  . Gastrointestinal hemorrhage 08/19/2018  . Acute respiratory failure with hypoxemia ([place])   . Possible OSA; on empiric CPAP 08/03/2018  . Fungus present in urine 08/03/2018  . Abdominal aortic atherosclerosis ([place]) 07/29/2018  . Pyuria 07/29/2018  . Sacral decubitus ulcer, stage III ([place]) 07/29/2018  . Somnolence 07/29/2018  . Acute  respiratory acidosis 07/29/2018  . Poor personal hygiene 07/29/2018  . Hemoglobin decreased 07/29/2018  . Sepsis ([place]) 07/28/2018  . Pneumonia 06/04/2018  . Dehydration   . Possible HCAP (healthcare-associated pneumonia)   . Hypotension   . Grade II diastolic dysfunction   . Fluid overload 05/19/2018  . Elephantiasis nostra verrucosa 05/19/2018  . Pressure ulcer of upper thigh, right, stage III ([place]) present before admit 05/19/2018  . Candidal intertrigo 05/19/2018  . Chronic venous insufficiency, Bilateral Legs 05/19/2018  . Elevated PSA, greater than or equal to 20 ng/ml  05/19/2018  . History of prostate surgery, Per Patient report, [facility] 05/19/2018  . Infestation by bed bug 05/19/2018  . Lives alone with limited help 05/19/2018  . Morbid obesity ([place]) 05/19/2018  . SOB (shortness of breath) 05/19/2018  . Systolic ejection murmur with radiation into Cartoids bilaterally 05/19/2018  . Paget's bone disease 05/19/2018  . Pulmonary hypertension ([place]) 05/19/2018  . Hypoxia   . AKI (acute kidney injury) ([place])   . Impaired mobility 09/09/2014  . Ulcer of left lower leg ([place]) 10/05/2012  . Encounter for central line placement 10/05/2012  . Anemia of chronic disease 05/21/2012  . Chronic leg pain 11/17/2011  . HLD (hyperlipidemia) 12/01/2008  . ADENOCARCINOMA, PROSTATE 05/24/2008  . Morbid obesity with body mass index of 50.0-59.9 in adult ([place]) 03/05/2007  . Essential hypertension 03/05/2007    Past Surgical History:  Procedure Laterality Date  . COLONOSCOPY    . COLONOSCOPY    . ESOPHAGOGASTRODUODENOSCOPY (EGD) WITH PROPOFOL N/A 08/27/2018   Procedure: ESOPHAGOGASTRODUODENOSCOPY (EGD) WITH PROPOFOL HEMOSPRAY ;  Surgeon: Otis Brace, MD;  Location: [facility];  Service: Gastroenterology;  Laterality: N/A;  bedside EGD  . ESOPHAGOGASTRODUODENOSCOPY (EGD) WITH PROPOFOL N/A 09/08/2018   Procedure: ESOPHAGOGASTRODUODENOSCOPY (EGD) WITH PROPOFOL;  Surgeon: Clarene Essex, MD;  Location: [facility];  Service: Endoscopy;  Laterality: N/A;  . HIP ARTHROPLASTY     bil  . HOT HEMOSTASIS N/A 08/27/2018   Procedure: HOT HEMOSTASIS (ARGON PLASMA COAGULATION/BICAP);  Surgeon: Otis Brace, MD;  Location: [facility];  Service: Gastroenterology;  Laterality: N/A;  bicap  . I&D EXTREMITY  08/14/2012   Procedure: IRRIGATION AND DEBRIDEMENT EXTREMITY;  Surgeon: Newt Minion, MD;  Location: [facility];  Service: Orthopedics;  Laterality: Right;  Irrigation and Debridement Right tibia, Placement antibiotic beads  . I&D EXTREMITY Left 03/18/2014   Procedure:  IRRIGATION AND DEBRIDEMENT EXTREMITY;  Surgeon: Newt Minion, MD;  Location: [facility];  Service: Orthopedics;  Laterality: Left;  Debridement Left Calf Ulcer, Apply Theraskin and Wound VAC  . IR ANGIOGRAM SELECTIVE EACH ADDITIONAL VESSEL  09/05/2018  . IR ANGIOGRAM SELECTIVE EACH ADDITIONAL VESSEL  09/05/2018  . IR ANGIOGRAM SELECTIVE EACH ADDITIONAL VESSEL  09/05/2018  . IR ANGIOGRAM VISCERAL SELECTIVE  09/05/2018  . IR ANGIOGRAM VISCERAL SELECTIVE  09/05/2018  . IR EMBO ART  VEN HEMORR LYMPH EXTRAV  INC GUIDE ROADMAPPING  09/05/2018  . IR US GUIDE VASC ACCESS RIGHT  09/05/2018  . IRRIGATION AND DEBRIDEMENT ABSCESS Left 03/18/2014   DR DUDA  . LEG SURGERY     ROD for fracture  . LEG WOUND REPAIR / CLOSURE     Beads .  Skin wound  . SKIN GRAFT     right leg- from donor skin  . SKIN GRAFT  1976   R wrist   skin graft from left thigh  . SUBMUCOSAL INJECTION  08/27/2018   Procedure: SUBMUCOSAL INJECTION;  Surgeon: Otis Brace, MD;  Location: [facility];  Service: Gastroenterology;;  . UPPER GASTROINTESTINAL ENDOSCOPY    . WISDOM TOOTH EXTRACTION    . WRIST FUSION     right wrist    Allergies Ace inhibitors; Angiotensin receptor blockers; and Metformin  Family History  Problem Relation Age of Onset  . Hypertension Mother     Social History Social History   Tobacco Use  . Smoking status: Never Smoker  . Smokeless tobacco: Never Used  Substance Use Topics  . Alcohol use: No  . Drug use: No    Review of Systems  Constitutional: No fever/chills Eyes: No visual changes. ENT: No sore throat. Cardiovascular: Denies chest pain. Respiratory: Positive shortness of breath and cough.  Gastrointestinal: No abdominal pain.  No nausea, no vomiting.  No diarrhea.  No constipation. Genitourinary: Negative for dysuria. Musculoskeletal: Negative for back pain. Skin: Negative for rash. Neurological: Negative for headaches, focal weakness or numbness.  10-point ROS otherwise  negative.  ____________________________________________   PHYSICAL EXAM:  VITAL SIGNS: ED Triage Vitals  Enc Vitals Group     BP 10/14/18 1635 129/84     Pulse Rate 10/14/18 1635 69     Resp 10/14/18 1635 18     Temp 10/14/18 1635 (!) 97.4 F (36.3 C)     Temp Source 10/14/18 1635 Oral     SpO2 10/14/18 1635 98 %     Weight 10/14/18 1639 (!) 309 lb 11.9 oz (140.5 kg)     Height 10/14/18 1639 6' (1.829 m)     Pain Score 10/14/18 1638 0   Constitutional: Alert and oriented. Patient able to provide history but does have some increased WOB.  Eyes: Conjunctivae are normal. Head: Atraumatic. Nose: No congestion/rhinnorhea. Mouth/Throat: Mucous membranes are moist.  Neck: No stridor.  Cardiovascular: Normal rate, regular rhythm. Good peripheral circulation.  Respiratory: Increased respiratory effort.  No retractions. Lungs diminished at the bases bilaterally.  Gastrointestinal: Soft and nontender. No distention. Rectal exam performed with nurse chaperone with no melena or gross blood.  Musculoskeletal: No lower extremity tenderness. Chronic changes in the B/L LE consistent with lymphedema. No gross deformities of extremities. Neurologic:  Normal speech and language. No gross focal neurologic deficits are appreciated.  Skin:  Skin is warm, dry and intact. No rash noted.  ____________________________________________   LABS (all labs ordered are listed, but only abnormal results are displayed)  Labs Reviewed  COMPREHENSIVE METABOLIC PANEL - Abnormal; Notable for the following components:      Result Value   Sodium 152 (*)    Potassium 2.5 (*)    Chloride 93 (*)    CO2 45 (*)    Glucose, Bld 165 (*)    Calcium 8.8 (*)    Albumin 2.7 (*)    Alkaline Phosphatase 138 (*)    All other components within normal limits  CBC WITH DIFFERENTIAL/PLATELET - Abnormal; Notable for the following components:   RBC 3.20 (*)    Hemoglobin 8.9 (*)    HCT 31.1 (*)    MCHC 28.6 (*)    Lymphs  Abs 0.6 (*)    All other components within normal limits  BRAIN NATRIURETIC PEPTIDE - Abnormal; Notable for the following components:   B Natriuretic Peptide 132.8 (*)    All other components within normal limits  MAGNESIUM - Abnormal; Notable for the following components:   Magnesium 1.5 (*)    All other components within normal limits  GLUCOSE, CAPILLARY - Abnormal; Notable for the following components:   Glucose-Capillary 141 (*)  All other components within normal limits  POC OCCULT BLOOD, ED - Abnormal; Notable for the following components:   Fecal Occult Bld POSITIVE (*)    All other components within normal limits  TROPONIN I  SODIUM, URINE, RANDOM  TYPE AND SCREEN   ____________________________________________  EKG   EKG Interpretation  Date/Time:  Wednesday October 14 2018 16:40:23 EDT Ventricular Rate:  69 PR Interval:    QRS Duration: 122 QT Interval:  459 QTC Calculation: 492 R Axis:   -33 Text Interpretation:  Sinus rhythm Atrial premature complex Prolonged PR interval Nonspecific intraventricular conduction delay Probable lateral infarct, age indeterminate No STEMI.  Confirmed by Nanda Quinton [phone]) on 10/14/2018 5:53:06 PM       ____________________________________________  RADIOLOGY  Ct Chest Wo Contrast  Result Date: 10/14/2018 CLINICAL DATA:  Decreased oxygen saturation. Responded oxygen therapy. EXAM: CT CHEST WITHOUT CONTRAST TECHNIQUE: Multidetector CT imaging of the chest was performed following the standard protocol without IV contrast. COMPARISON:  09/02/2018 FINDINGS: Cardiovascular: Evaluation of vascular structures is limited without IV contrast material. Heart size is normal. No pericardial effusions. Coronary artery and aortic valve calcifications. Normal caliber of the thoracic aorta with scattered calcification. Mediastinum/Nodes: Esophagus is decompressed. No significant lymphadenopathy in the chest. Lungs/Pleura: Small bilateral pleural  effusions and basilar consolidation, greater on the right. This may indicate atelectasis or pneumonia. There is progression since the previous study. No pneumothorax. Airways are patent. Upper Abdomen: No acute changes demonstrated in the visualized upper abdomen. Musculoskeletal: No chest wall mass or suspicious bone lesions identified. IMPRESSION: Small bilateral pleural effusions with basilar consolidation, greater on the right. This may indicate atelectasis or pneumonia. Progression since previous study Aortic Atherosclerosis (ICD10-I70.0). Electronically Signed   By: Lucienne Capers M.D.   On: 10/14/2018 21:15   Dg Chest Portable 1 View  Result Date: 10/14/2018 CLINICAL DATA:  Shortness of breath EXAM: PORTABLE CHEST 1 VIEW COMPARISON:  09/25/2018 FINDINGS: Haziness of the bilateral chest that is likely atelectasis and pleural fluid. Cardiomegaly and aortic tortuosity. No air bronchogram or cephalized blood flow. IMPRESSION: Hazy opacities at both bases, also seen 09/25/2018. This is likely atelectasis and pleural fluid, which was seen on a 09/02/2018 chest CT. There could be superimposed pneumonia. Cardiomegaly. Electronically Signed   By: Monte Fantasia M.D.   On: 10/14/2018 19:05    ____________________________________________   PROCEDURES  Procedure(s) performed:   .Critical Care Performed by: Margette Fast, MD Authorized by: Margette Fast, MD   Critical care provider statement:    Critical care time (minutes):  35   Critical care time was exclusive of:  Separately billable procedures and treating other patients and teaching time   Critical care was necessary to treat or prevent imminent or life-threatening deterioration of the following conditions:  Respiratory failure   Critical care was time spent personally by me on the following activities:  Blood draw for specimens, development of treatment plan with patient or surrogate, evaluation of patient's response to treatment,  examination of patient, obtaining history from patient or surrogate, ordering and performing treatments and interventions, ordering and review of laboratory studies, ordering and review of radiographic studies, pulse oximetry, re-evaluation of patient's condition and review of old charts   I assumed direction of critical care for this patient from another provider in my specialty: no      ____________________________________________   INITIAL IMPRESSION / [place] / ED COURSE  Pertinent labs & imaging results that were available during my care of the  patient were reviewed by me and considered in my medical decision making (see chart for details).  She returns to the emergency department with shortness of breath and hypoxemia.  He had 4 bowel movements at the time of his shortness of breath.  He was recently admitted and discharged after a prolonged, complicated hospitalization.  He has a known pulmonary embolism and new CHF.  Patient does appear to be moderately volume overloaded although has been compliant with his Lasix.  He is able to provide a history at this time he does not seem confused.  Bowel movements from the facility not described as grossly bloody or melena but plan to check that here with his recent history of GI bleeding.  For now, plan to start the patient on BiPAP and reassess after imaging and labs.   Patient feeling better on BiPAP. Hypoxemia improved here.   9:39 PM Patient continues to be awake and alert with normal sats. Patient with hypokalemia here. Ordered IV potassium. CXR with no clear etiology for symptoms. Doubt superimposed PNA without fever or leukocytosis. Patient with known PE and compliant with Xarelto. With symptoms worse with laying flat and on BiPAP will not send for CTA at this time.   Discussed patient's case with Hospitalist to request admission. Patient and family (if present) updated with plan. Care transferred to Hospitalist service.  I  reviewed all nursing notes, vitals, pertinent old records, EKGs, labs, imaging (as available).  ____________________________________________  FINAL CLINICAL IMPRESSION(S) / ED DIAGNOSES  Final diagnoses:  Hypoxia  SOB (shortness of breath)  Hypokalemia    MEDICATIONS GIVEN DURING THIS VISIT:  Medications  potassium chloride 10 mEq in 100 mL IVPB (0 mEq Intravenous Stopped 10/14/18 2036)  furosemide (LASIX) injection 40 mg (40 mg Intravenous Given 10/14/18 1932)    Note:  This document was prepared using Dragon voice recognition software and may include unintentional dictation errors.  Nanda Quinton, MD Emergency Medicine    Selby Foisy, [facility], MD 10/14/18 2139

## 2018-10-14 NOTE — Patient Outreach (Signed)
[facility]) Care Management  10/14/2018  [name] Mar 04, 1941 [id number]  Met with [name], discharge planner.  She reports that they have a Care plan meeting scheduled for tomorrow. Patient is adamant about going home. He refused to apply for Medicaid.  Discussed that patient will be active with [facility] care management upon going home. No discharge date is set as of yet.   Attempted to meet with patient. He was not in his room x 2 attempts.  Plan to update [facility] care team. [name]. [name], RN, BSN, [place] [phone]) Business Cell  [phone]) Toll Free Office

## 2018-10-14 NOTE — Progress Notes (Signed)
Pt transported from ED to CT then ICU (1236) on bipap 50% fio2.  Pt tolerated transport well without incident.

## 2018-10-14 NOTE — ED Notes (Signed)
ED TO INPATIENT HANDOFF REPORT  Name/Age/Gender [name] 77 y.o. male  Code Status Code Status History    Date Active Date Inactive Code Status Order ID Comments User Context   09/04/2018 1604 10/02/2018 0052 Partial Code [id number]  Acquanetta Chain, DO Inpatient   08/19/2018 1810 09/04/2018 1604 Full Code [id number]  Laurin Coder, MD ED   07/28/2018 2114 08/08/2018 0226 Full Code [id number]  Shirley, [place], DO Inpatient   06/04/2018 0917 06/08/2018 1914 Full Code [id number]  Tonette Bihari, MD ED   05/19/2018 0632 06/02/2018 2118 Full Code [id number]  Guadalupe Dawn, MD ED   03/15/2017 0147 03/19/2017 2344 Full Code [id number]  Mayo, Pete Pelt, MD Inpatient   03/18/2014 1556 03/22/2014 1824 Full Code [id number]  Newt Minion, MD Inpatient   08/14/2012 1504 08/17/2012 1929 Full Code [id number]  Clayburn Pert, RN Inpatient    Questions for Most Recent Historical Code Status (Order [id number])    Question Answer Comment   In the event of cardiac or respiratory ARREST: Initiate Code Blue, Call Rapid Response Yes    In the event of cardiac or respiratory ARREST: Perform CPR No    In the event of cardiac or respiratory ARREST: Perform Intubation/Mechanical Ventilation No    In the event of cardiac or respiratory ARREST: Use NIPPV/BiPAp only if indicated Yes    In the event of cardiac or respiratory ARREST: Administer ACLS medications if indicated Yes    In the event of cardiac or respiratory ARREST: Perform Defibrillation or Cardioversion if indicated Yes    Comments patient does not want "life support machines", see palliative note dated 9/6         Advance Directive Documentation     Most Recent Value  Type of Advance Directive  Out of facility DNR (pink MOST or yellow form)  Pre-existing out of facility DNR order (yellow form or pink MOST form)  Yellow form placed in chart (order not valid for inpatient use)  "MOST" Form in Place?  -      Home/SNF/Other Nursing Home  Chief  Complaint SOB  Level of Care/Admitting Diagnosis ED Disposition    ED Disposition Condition [place]: [place] [[id number]]  Level of Care: Stepdown [14]  Admit to SDU based on following criteria: Respiratory Distress:  Frequent assessment and/or intervention to maintain adequate ventilation/respiration, pulmonary toilet, and respiratory treatment.  Diagnosis: Acute respiratory failure with hypoxia [facility]) [[id number]]  Admitting Physician: Shelbie Proctor [[id number]]  Attending Physician: Shelbie Proctor [[id number]]  Estimated length of stay: past midnight tomorrow  Certification:: I certify this patient will need inpatient services for at least 2 midnights  PT Class (Do Not Modify): Inpatient [101]  PT Acc Code (Do Not Modify): Private [1]       Medical History Past Medical History:  Diagnosis Date  . Chronic osteomyelitis of lower leg (HCC) 11/20/2010   Chronic osteoarthritis of right lower leg-10/03/10 note by Dr. Harvie Heck scanned of this visit under media tab. Dr. Sharol Given states that this is chronic osteomyelitis, only cure would be amputation. Patient was not agreeable to this at his visit with Dr. Sharol Given. Patient elected to apply Bactroban cream, Dr. Sharol Given and time of that visit also prescribe doxycycline. Dr. Sharol Given stay the patient she come to the [place] 02/26/2007   Qualifier: Diagnosis of  By: Erling Cruz  MD, [place]    . Complication of anesthesia   . Diabetes mellitus   .  Diabetic peripheral neuropathy associated with type 2 diabetes mellitus ([place]) 02/07/2014  . GERD (gastroesophageal reflux disease)   . Grade II diastolic dysfunction   . Heart murmur    last 2D Echo -03/31/08  . HIATAL HERNIA WITH REFLUX 09/29/2006   Qualifier: Diagnosis of  By: Erling Cruz  MD, MELISSA    . HIP REPLACEMENT, BILATERAL, HX OF 05/12/2007   Qualifier: Diagnosis of  By: Erling Cruz  MD, MELISSA    . History of blood transfusion   . Hypertension   . OSTEOARTHRITIS  03/05/2007   Qualifier: Diagnosis of  By: Erling Cruz  MD, MELISSA    . Paget's disease of bony pelvis   . PONV (postoperative nausea and vomiting)   . Prostate cancer ([place])   . SBO (small bowel obstruction) ([place]) 02/2017  . Seasonal allergies   . VENOUS INSUFFICIENCY, CHRONIC 02/26/2007   Qualifier: Diagnosis of  By: Erling Cruz  MD, MELISSA      Allergies Allergies  Allergen Reactions  . Ace Inhibitors Cough    With Lisinopril.  . Angiotensin Receptor Blockers Other (See Comments)    Caused excessive weight gain  . Metformin Diarrhea    IV Location/Drains/Wounds Patient Lines/Drains/Airways Status   Active Line/Drains/Airways    Name:   Placement date:   Placement time:   Site:   Days:   Peripheral IV 10/14/18 Left Forearm   10/14/18    1722    Forearm   less than 1   Pressure Injury 05/19/18 Stage II -  Partial thickness loss of dermis presenting as a shallow open ulcer with a red, pink wound bed without slough.   05/19/18    2134     148   Pressure Injury 08/19/18 Stage II -  Partial thickness loss of dermis presenting as a shallow open ulcer with a red, pink wound bed without slough. 2cm x 2.5cm pink/red wound bed    08/19/18    2000     56   Pressure Injury 08/19/18 Stage II -  Partial thickness loss of dermis presenting as a shallow open ulcer with a red, pink wound bed without slough. 6cm x 5cm ulceration with pink, red, and white wound base   08/19/18    2000     56   Pressure Injury 08/19/18 Stage II -  Partial thickness loss of dermis presenting as a shallow open ulcer with a red, pink wound bed without slough.   08/19/18    2000     56   Wound / Incision (Open or Dehisced) 08/19/18 Other (Comment) Pretibial Right;Anterior;Mid 1cm x 1cm open wound with white base   08/19/18    2000    Pretibial   56   Wound / Incision (Open or Dehisced) 08/19/18 Other (Comment) Pretibial Right;Mid 1cm x 1cm wound with eschar    08/19/18    2200    Pretibial   56          Labs/Imaging Results for  orders placed or performed during the hospital encounter of 10/14/18 (from the past 48 hour(s))  Type and screen [place]     Status: None   Collection Time: 10/14/18  5:30 PM  Result Value Ref Range   ABO/RH(D) O POS    Antibody Screen NEG    Sample Expiration      10/17/2018 Performed at [facility], [place] [address], [place], [place] [address]   Comprehensive metabolic panel     Status: Abnormal  Collection Time: 10/14/18  5:33 PM  Result Value Ref Range   Sodium 152 (H) 135 - 145 mmol/L   Potassium 2.5 (LL) 3.5 - 5.1 mmol/L    Comment: CRITICAL RESULT CALLED TO, READ BACK BY AND VERIFIED WITH: DR J LONG [id number] @ 1834 BY J SCOTTON    Chloride 93 (L) 98 - 111 mmol/L   CO2 45 (H) 22 - 32 mmol/L   Glucose, Bld 165 (H) 70 - 99 mg/dL   BUN 23 8 - 23 mg/dL   Creatinine, Ser 1.01 0.61 - 1.24 mg/dL   Calcium 8.8 (L) 8.9 - 10.3 mg/dL   Total Protein 7.0 6.5 - 8.1 g/dL   Albumin 2.7 (L) 3.5 - 5.0 g/dL   AST 15 15 - 41 U/L   ALT 15 0 - 44 U/L   Alkaline Phosphatase 138 (H) 38 - 126 U/L   Total Bilirubin 0.7 0.3 - 1.2 mg/dL   GFR calc non Af Amer >60 >60 mL/min   GFR calc Af Amer >60 >60 mL/min    Comment: (NOTE) The eGFR has been calculated using the CKD EPI equation. This calculation has not been validated in all clinical situations. eGFR's persistently <60 mL/min signify possible Chronic Kidney Disease.    Anion gap 14 5 - 15    Comment: Performed at [facility], [place] [address] [place], [place] [address]  CBC with Differential     Status: Abnormal   Collection Time: 10/14/18  5:33 PM  Result Value Ref Range   WBC 7.0 4.0 - 10.5 K/uL   RBC 3.20 (L) 4.22 - 5.81 MIL/uL   Hemoglobin 8.9 (L) 13.0 - 17.0 g/dL   HCT 31.1 (L) 39.0 - 52.0 %   MCV 97.2 80.0 - 100.0 fL   MCH 27.8 26.0 - 34.0 pg   MCHC 28.6 (L) 30.0 - 36.0 g/dL   RDW 15.5 11.5 - 15.5 %   Platelets 241 150 - 400 K/uL   nRBC 0.0 0.0 - 0.2 %   Neutrophils  Relative % 83 %   Neutro Abs 5.8 1.7 - 7.7 K/uL   Lymphocytes Relative 9 %   Lymphs Abs 0.6 (L) 0.7 - 4.0 K/uL   Monocytes Relative 6 %   Monocytes Absolute 0.4 0.1 - 1.0 K/uL   Eosinophils Relative 1 %   Eosinophils Absolute 0.1 0.0 - 0.5 K/uL   Basophils Relative 0 %   Basophils Absolute 0.0 0.0 - 0.1 K/uL   Immature Granulocytes 1 %   Abs Immature Granulocytes 0.05 0.00 - 0.07 K/uL    Comment: Performed at [facility], [place] [address], [place], [place] [address]  Brain natriuretic peptide     Status: Abnormal   Collection Time: 10/14/18  5:33 PM  Result Value Ref Range   B Natriuretic Peptide 132.8 (H) 0.0 - 100.0 pg/mL    Comment: Performed at [facility], [place] [address] [place], [place] [address]  Troponin I     Status: None   Collection Time: 10/14/18  5:33 PM  Result Value Ref Range   Troponin I <0.03 <0.03 ng/mL    Comment: Performed at [facility], [place] [address] [place], [place] [address]  POC occult blood, ED Provider will collect     Status: Abnormal   Collection Time: 10/14/18  7:45 PM  Result Value Ref Range   Fecal Occult Bld POSITIVE (A) NEGATIVE   Dg Chest Portable 1 View  Result Date: 10/14/2018  CLINICAL DATA:  Shortness of breath EXAM: PORTABLE CHEST 1 VIEW COMPARISON:  09/25/2018 FINDINGS: Haziness of the bilateral chest that is likely atelectasis and pleural fluid. Cardiomegaly and aortic tortuosity. No air bronchogram or cephalized blood flow. IMPRESSION: Hazy opacities at both bases, also seen 09/25/2018. This is likely atelectasis and pleural fluid, which was seen on a 09/02/2018 chest CT. There could be superimposed pneumonia. Cardiomegaly. Electronically Signed   By: Monte Fantasia M.D.   On: 10/14/2018 19:05   EKG Interpretation  Date/Time:  Wednesday October 14 2018 16:40:23 EDT Ventricular Rate:  69 PR Interval:    QRS Duration: 122 QT Interval:  459 QTC Calculation: 492 R  Axis:   -33 Text Interpretation:  Sinus rhythm Atrial premature complex Prolonged PR interval Nonspecific intraventricular conduction delay Probable lateral infarct, age indeterminate No STEMI.  Confirmed by Nanda Quinton [phone]) on 10/14/2018 5:53:06 PM   Pending Labs Unresulted Labs (From admission, onward)    Start     Ordered   10/14/18 2012  Magnesium  Add-on,   R     10/14/18 2012   Signed and Held  CBC  Tomorrow morning,   R     Signed and Held   Signed and Held  Basic metabolic panel  Tomorrow morning,   R     Signed and Held          Vitals/Pain Today's Vitals   10/14/18 1858 10/14/18 1900 10/14/18 1930 10/14/18 2000  BP: (!) 144/69 (!) 151/62 (!) 145/76 138/88  Pulse: 69 68 68 71  Resp: _0 (!) 24  Temp:      TempSrc:      SpO2: 100% 100% 100% 97%  Weight:      Height:      PainSc:        Isolation Precautions No active isolations  Medications Medications  potassium chloride 10 mEq in 100 mL IVPB (10 mEq Intravenous New Bag/Given 10/14/18 1930)  furosemide (LASIX) injection 40 mg (40 mg Intravenous Given 10/14/18 1932)    Mobility non-ambulatory

## 2018-10-15 DIAGNOSIS — I5033 Acute on chronic diastolic (congestive) heart failure: Secondary | ICD-10-CM

## 2018-10-15 LAB — BASIC METABOLIC PANEL
ANION GAP: 13 (ref 5–15)
Anion gap: 12 (ref 5–15)
BUN: 20 mg/dL (ref 8–23)
BUN: 18 mg/dL (ref 8–23)
CALCIUM: 8.5 mg/dL — AB (ref 8.9–10.3)
CALCIUM: 8.5 mg/dL — AB (ref 8.9–10.3)
CO2: 47 mmol/L — ABNORMAL HIGH (ref 22–32)
CO2: 46 mmol/L — ABNORMAL HIGH (ref 22–32)
CREATININE: 0.9 mg/dL (ref 0.61–1.24)
Chloride: 91 mmol/L — ABNORMAL LOW (ref 98–111)
Chloride: 89 mmol/L — ABNORMAL LOW (ref 98–111)
Creatinine, Ser: 0.96 mg/dL (ref 0.61–1.24)
GFR calc Af Amer: >60 mL/min (ref >60)
GFR calc Af Amer: >60 mL/min (ref >60)
GLUCOSE: 111 mg/dL — AB (ref 70–99)
GLUCOSE: 186 mg/dL — AB (ref 70–99)
POTASSIUM: 2.9 mmol/L — AB (ref 3.5–5.1)
Potassium: 2.6 mmol/L — CL (ref 3.5–5.1)
SODIUM: 150 mmol/L — AB (ref 135–145)
SODIUM: 148 mmol/L — AB (ref 135–145)

## 2018-10-15 LAB — CBC
HCT: 28 % — ABNORMAL LOW (ref 39.0–52.0)
HCT: 30.3 % — ABNORMAL LOW (ref 39.0–52.0)
Hemoglobin: 7.9 g/dL — ABNORMAL LOW (ref 13.0–17.0)
Hemoglobin: 8.5 g/dL — ABNORMAL LOW (ref 13.0–17.0)
MCH: 27.2 pg (ref 26.0–34.0)
MCH: 27.2 pg (ref 26.0–34.0)
MCHC: 28.2 g/dL — AB (ref 30.0–36.0)
MCHC: 28.1 g/dL — AB (ref 30.0–36.0)
MCV: 96.6 fL (ref 80.0–100.0)
MCV: 96.8 fL (ref 80.0–100.0)
NRBC: 0 % (ref 0.0–0.2)
PLATELETS: 207 10*3/uL (ref 150–400)
PLATELETS: 243 10*3/uL (ref 150–400)
RBC: 2.9 MIL/uL — ABNORMAL LOW (ref 4.22–5.81)
RBC: 3.13 MIL/uL — ABNORMAL LOW (ref 4.22–5.81)
RDW: 15.5 % (ref 11.5–15.5)
RDW: 15.6 % — ABNORMAL HIGH (ref 11.5–15.5)
WBC: 7.1 10*3/uL (ref 4.0–10.5)
WBC: 7.2 10*3/uL (ref 4.0–10.5)
nRBC: 0 % (ref 0.0–0.2)

## 2018-10-15 LAB — GLUCOSE, CAPILLARY
GLUCOSE-CAPILLARY: 123 mg/dL — AB (ref 70–99)
GLUCOSE-CAPILLARY: 106 mg/dL — AB (ref 70–99)
GLUCOSE-CAPILLARY: 198 mg/dL — AB (ref 70–99)
Glucose-Capillary: 116 mg/dL — ABNORMAL HIGH (ref 70–99)
Glucose-Capillary: 69 mg/dL — ABNORMAL LOW (ref 70–99)

## 2018-10-15 LAB — SODIUM, URINE, RANDOM: SODIUM UR: 119 mmol/L

## 2018-10-15 LAB — HEMOGLOBIN A1C
HEMOGLOBIN A1C: 5.3 % (ref 4.8–5.6)
MEAN PLASMA GLUCOSE: 105.41 mg/dL

## 2018-10-15 MED ORDER — BOOST / RESOURCE BREEZE PO LIQD CUSTOM
1.0000 | Freq: Two times a day (BID) | ORAL | Status: DC
  Administered 2018-10-16 – 2018-10-17 (×3): 1 via ORAL

## 2018-10-15 MED ORDER — FUROSEMIDE 10 MG/ML IJ SOLN: 40.0000 mg | Freq: Every day | INTRAMUSCULAR | Status: DC

## 2018-10-15 MED ORDER — ADULT MULTIVITAMIN W/MINERALS CH
1.0000 | ORAL_TABLET | Freq: Every day | ORAL | Status: DC
  Administered 2018-10-15 – 2018-10-17 (×3): 1 via ORAL
  Filled 2018-10-15 (×3): qty 1

## 2018-10-15 MED ORDER — PRO-STAT SUGAR FREE PO LIQD
30.0000 mL | Freq: Two times a day (BID) | ORAL | Status: DC
  Administered 2018-10-15 – 2018-10-17 (×4): 30 mL via ORAL
  Filled 2018-10-15 (×4): qty 30

## 2018-10-15 MED ORDER — POTASSIUM CHLORIDE CRYS ER 20 MEQ PO TBCR
40.0000 meq | EXTENDED_RELEASE_TABLET | ORAL | Status: AC
  Administered 2018-10-15 (×3): 40 meq via ORAL
  Filled 2018-10-15 (×3): qty 2

## 2018-10-15 NOTE — Progress Notes (Signed)
CRITICAL VALUE ALERT  Critical Value:  Potassium 2.6  Date & Time Notied:  10/15/18 09:15   Provider Notified: Dr. Lonny Prude  Orders Received/Actions taken: Potassium 40mg  PO.

## 2018-10-15 NOTE — Progress Notes (Signed)
PROGRESS NOTE    [name]  VQQ:[id number] DOB: Sep 10, 1941 DOA: 10/14/2018 PCP: [place] Bing, DO   Brief Narrative: [name] is a 77 y.o. male with medical history significant of  CDM, DM, GIB, PE. Patient presented with shortness of breath likely secondary to fluid overload from acute heart failure.   Assessment & Plan:   Principal Problem:   Acute respiratory failure with hypoxia (HCC) Active Problems:   Morbid obesity with body mass index of 50.0-59.9 in adult [facility])   Essential hypertension   DM2 (diabetes mellitus, type 2) (HCC)   Elephantiasis nostra verrucosa   Pressure ulcer of upper thigh, right, stage III ([place]) present before admit   Hypernatremia   Hypokalemia   History of pulmonary embolism   History of duodenal ulcer 07/2018   Acute on chronic respiratory failure Unknown of how much patient uses at baseline, but he states he has oxygen as an outpatient. Was on Bipap overnight but appears improved today, yet, not likely at baseline. CO2 significantly elevated above baseline. -Wean to room air as able -Watch for continued increase, concerning for worsening respiratory acidosis. -Incentive spirometry  Acute on chronic diastolic heart failure Patient with some pleural effusions and respiratory failure. Improved with lasix. -Continue lasix 40 mg IV daily  Fecal occult positive blood test In setting of Eliquis use for recent PE. History of EGDs significant for gastritis and duodenal ulcers.  Anemia Chronic. History of upper GI bleeding. -Repeat CBC. If continues to drop, hold Eliquis and consult GI  Essential hypertension Moderately well controlled. -Continue metoprolol  COPD -Continue Pulmicort  Adrenal insufficiency -Continue Cortef  Pressure ulcers Present on admission. Stage II  Hypernatremia Likely secondary to poor oral intake. Improving. -Repeat BMP this afternoon  Hyperlipidemia -Continue pravastatin  Diabetes mellitus,  insulin dependent -Continue SSI  Hypokalemia Magnesium low. Repleted but still low this morning -Potassium supplementation -Recheck BMP this afternoon  Hypomagnesemia S/p mag supplementation  Pulmonary embolism Recent from one month ago. Currently on Eliquis as an outpatient.  Hypokalemia Severe. Potassium of 2.5 last night. Even with repletion, only up to 2.6 today. Likely worsened in setting of diuresis. -Repeat BMP   DVT prophylaxis: Eliquis Code Status:   Code Status: Partial Code Family Communication: None at bedside Disposition Plan: Keep in stepdown until electrolytes normalize and hemoglobin stabilizes. Discharge back to SNF when medically ready   Consultants:   None  Procedures:   Bipap  Antimicrobials:  None    Subjective: Breathing is improved this morning.  Objective: Vitals:   10/15/18 0805 10/15/18 0828 10/15/18 1134 10/15/18 1154  BP: (!) 121/45  (!) 146/58   Pulse: (!) 58  62   Resp: 18  16   Temp:    98.3 F (36.8 C)  TempSrc:    Oral  SpO2: 97% 100% 100%   Weight:      Height:        Intake/Output Summary (Last 24 hours) at 10/15/2018 1247 Last data filed at 10/15/2018 1000 Gross per 24 hour  Intake 300 ml  Output 2625 ml  Net -2325 ml   Filed Weights   10/14/18 1639  Weight: (!) 140.5 kg    Examination:  General exam: Appears calm and comfortable Respiratory system: Respiratory effort normal. Cardiovascular system: S1 & S2 heard, RRR. No murmurs, rubs, gallops or clicks. Gastrointestinal system: Abdomen is nondistended, soft and nontender. Normal bowel sounds heard. Central nervous system: Alert and oriented. No focal neurological deficits. Extremities: No edema. No  calf tenderness Skin: No cyanosis. Psychiatry: Judgement and insight appear normal. Mood & affect appropriate.     Data Reviewed: I have personally reviewed following labs and imaging studies  CBC: Recent Labs  Lab 10/14/18 1733 10/15/18 0323  WBC  7.0 7.1  NEUTROABS 5.8  --   HGB 8.9* 7.9*  HCT 31.1* 28.0*  MCV 97.2 96.6  PLT 241 449   Basic Metabolic Panel: Recent Labs  Lab 10/14/18 1733 10/15/18 0826  NA 152* 150*  K 2.5* 2.6*  CL 93* 91*  CO2 45* 47*  GLUCOSE 165* 111*  BUN 23 20  CREATININE 1.01 0.90  CALCIUM 8.8* 8.5*  MG 1.5*  --    GFR: Estimated Creatinine Clearance: 98.4 mL/min (by C-G formula based on SCr of 0.9 mg/dL). Liver Function Tests: Recent Labs  Lab 10/14/18 1733  AST 15  ALT 15  ALKPHOS 138*  BILITOT 0.7  PROT 7.0  ALBUMIN 2.7*   No results for input(s): LIPASE, AMYLASE in the last 168 hours. No results for input(s): AMMONIA in the last 168 hours. Coagulation Profile: No results for input(s): INR, PROTIME in the last 168 hours. Cardiac Enzymes: Recent Labs  Lab 10/14/18 1733  TROPONINI <0.03   BNP (last 3 results) No results for input(s): PROBNP in the last 8760 hours. HbA1C: Recent Labs    10/15/18 0323  HGBA1C 5.3   CBG: Recent Labs  Lab 10/14/18 2120 10/15/18 0744 10/15/18 1104  GLUCAP 141* 123* 106*   Lipid Profile: No results for input(s): CHOL, HDL, LDLCALC, TRIG, CHOLHDL, LDLDIRECT in the last 72 hours. Thyroid Function Tests: No results for input(s): TSH, T4TOTAL, FREET4, T3FREE, THYROIDAB in the last 72 hours. Anemia Panel: No results for input(s): VITAMINB12, FOLATE, FERRITIN, TIBC, IRON, RETICCTPCT in the last 72 hours. Sepsis Labs: Recent Labs  Lab 10/14/18 2149  PROCALCITON <0.10    No results found for this or any previous visit (from the past 240 hour(s)).       Radiology Studies: Ct Chest Wo Contrast  Result Date: 10/14/2018 CLINICAL DATA:  Decreased oxygen saturation. Responded oxygen therapy. EXAM: CT CHEST WITHOUT CONTRAST TECHNIQUE: Multidetector CT imaging of the chest was performed following the standard protocol without IV contrast. COMPARISON:  09/02/2018 FINDINGS: Cardiovascular: Evaluation of vascular structures is limited without  IV contrast material. Heart size is normal. No pericardial effusions. Coronary artery and aortic valve calcifications. Normal caliber of the thoracic aorta with scattered calcification. Mediastinum/Nodes: Esophagus is decompressed. No significant lymphadenopathy in the chest. Lungs/Pleura: Small bilateral pleural effusions and basilar consolidation, greater on the right. This may indicate atelectasis or pneumonia. There is progression since the previous study. No pneumothorax. Airways are patent. Upper Abdomen: No acute changes demonstrated in the visualized upper abdomen. Musculoskeletal: No chest wall mass or suspicious bone lesions identified. IMPRESSION: Small bilateral pleural effusions with basilar consolidation, greater on the right. This may indicate atelectasis or pneumonia. Progression since previous study Aortic Atherosclerosis (ICD10-I70.0). Electronically Signed   By: Lucienne Capers M.D.   On: 10/14/2018 21:15   Dg Chest Portable 1 View  Result Date: 10/14/2018 CLINICAL DATA:  Shortness of breath EXAM: PORTABLE CHEST 1 VIEW COMPARISON:  09/25/2018 FINDINGS: Haziness of the bilateral chest that is likely atelectasis and pleural fluid. Cardiomegaly and aortic tortuosity. No air bronchogram or cephalized blood flow. IMPRESSION: Hazy opacities at both bases, also seen 09/25/2018. This is likely atelectasis and pleural fluid, which was seen on a 09/02/2018 chest CT. There could be superimposed pneumonia. Cardiomegaly. Electronically Signed  By: Monte Fantasia M.D.   On: 10/14/2018 19:05        Scheduled Meds: . apixaban  5 mg Oral BID  . budesonide (PULMICORT) nebulizer solution  0.5 mg Nebulization BID  . chlorhexidine  15 mL Mouth Rinse BID  . collagenase  1 application Topical Daily  . famotidine  40 mg Oral Daily  . feeding supplement  1 Container Oral BID BM  . feeding supplement (PRO-STAT SUGAR FREE 64)  30 mL Oral BID  . ferrous sulfate  325 mg Oral BID WC  . fluticasone  2  spray Each Nare Daily  . [START ON 10/16/2018] furosemide  40 mg Intravenous Daily  . hydrocortisone  10 mg Oral Daily  . insulin aspart  0-15 Units Subcutaneous TID WC  . latanoprost  1 drop Both Eyes QHS  . mouth rinse  15 mL Mouth Rinse q12n4p  . metoprolol tartrate  12.5 mg Oral BID  . multivitamin with minerals  1 tablet Oral Daily  . pantoprazole  40 mg Oral BID AC  . potassium chloride  40 mEq Oral Q4H  . pravastatin  40 mg Oral Daily  . sucralfate  1 g Oral TID WC & HS  . tamsulosin  0.8 mg Oral Daily   Continuous Infusions:   LOS: 1 day     Cordelia Poche, MD [facility] 10/15/2018, 12:47 PM  If 7PM-7AM, please contact night-coverage [email or web address]

## 2018-10-15 NOTE — Consult Note (Signed)
[facility] Nurse wound consult note Reason for Consult:Long Standing BLE Lymphedema with some partial thickness and scar tissue.  Wound type:venous insufficiency Pressure Injury POA: NA Measurement:NA Wound bed:NA Drainage (amount, consistency, odor) no drainage noted, however, foul smell present Periwound: no open wounds, however, skin changes due to lymphedema Dressing procedure/placement/frequency:Orders placed for ortho tech to apply bilateral Unna's boots and change weekly. Spoke with Ortho Tech to make him aware [place] is already in room. We will not follow, but will remain available to this patient, to nursing, and the medical and/or surgical teams.  Please re-consult if we need to assist further.  Fara Olden, RN-C, WTA-C, [place] Wound Treatment Associate Ostomy Care Associate

## 2018-10-15 NOTE — Progress Notes (Signed)
Initial Nutrition Assessment  DOCUMENTATION CODES:   Morbid obesity  INTERVENTION:  - Will order Boost Breeze BID, each supplement provides 250 kcal and 9 grams of protein. - Will order 30 mL Prostat BID, each supplement provides 100 kcal and 15 grams of protein. - Will order daily multivitamin with minerals. - Continue to encourage PO intakes.    NUTRITION DIAGNOSIS:   Increased nutrient needs related to wound healing, acute illness(stepdown status) as evidenced by estimated needs.  GOAL:   Patient will meet greater than or equal to 90% of their needs  MONITOR:   PO intake, Supplement acceptance, Weight trends, Labs, Skin  REASON FOR ASSESSMENT:   Other (Comment)(Pressure Injury report)  ASSESSMENT:   77 y.o. male with medical history significant of CDM, DM, GIB, PE. He reported SOB and hypoxia while at SNF. Patient complained of a cough with some mild sputum production. Patient has chronic edema in his legs, unsure of they have changed. Patient has a history of pulmonary embolism and is on Eliquis. He was admitted in August for a long stay for a GIB d/t a duodenal ulcer while on anticoagulation.  Was complicated by anemia requiring transfusion as well as hypotension requiring reversal of his anticoagulation and a stay in the ICU.  Diet advanced from NPO to Heart Healthy at 9:57 AM today. Patient was eating breakfast during RD visit; defer NFPE at this time d/t this. Patient had consumed all of breakfast potatoes and another item and was about 3/4 done with oatmeal by the end of RD visit. He denies any abdominal pain or nausea this AM. He denies any difficulties with chewing or swallowing at baseline.   Patient is well known to this RD from previous hospitalization. He had been eating poorly through the first half of last admission but the second half began eating much better and was consuming Prostat and Boost Breeze supplements. He states that he is hungry this AM and meal is  okay but he wishes he had salt. He reports that appetite was good and he was eating well at facility but had the same issue ("not enough salt to season").   Current weight consistent with weight from 10/2; will continue to monitor.    Medications reviewed; 40 mg oral Pepcid/day, 325 mg ferrous sulfate BID, 40 mg IV Lasix/day, 10 mg Cortef/day, sliding scale Novolog, 1 mg IV Mg sulfate x1 run yesterday, 40 mg oral Protonix BID, 10 mEq IV KCl x4 runs yesterday, 40 mEq K-Dur/day, 1 g Carafate TID. Labs reviewed; CBG: 123 mg/dL today, Na: 150 mmol/L, K: 2.6 mmol/L, Cl: 91 mmol/L, Ca: 8.5 mg/dL.      NUTRITION - FOCUSED PHYSICAL EXAM:  Will attempt at follow-up.   Diet Order:   Diet Order            Diet Heart Room service appropriate? Yes; Fluid consistency: Thin  Diet effective now              EDUCATION NEEDS:   No education needs have been identified at this time  Skin:  Skin Assessment: Skin Integrity Issues: Skin Integrity Issues:: Stage II Stage II: L heel, L thich, coccyx, sacrum  Last BM:  10/17  Height:   Ht Readings from Last 1 Encounters:  10/14/18 6' (1.829 m)    Weight:   Wt Readings from Last 1 Encounters:  10/14/18 (!) 140.5 kg    Ideal Body Weight:  80.91 kg  BMI:  Body mass index is 42.01 kg/m.  Estimated  Nutritional Needs:   Kcal:  2110-2390 (15-17 kcal/kg)  Protein:  120-130 grams  Fluid:  >/= 1.8 L/day     Jarome Matin, MS, RD, LDN, [facility] Inpatient Clinical Dietitian Pager # [phone] After hours/weekend pager # [phone]

## 2018-10-16 ENCOUNTER — Inpatient Hospital Stay (HOSPITAL_COMMUNITY): Payer: PPO

## 2018-10-16 DIAGNOSIS — E873 Alkalosis: Secondary | ICD-10-CM

## 2018-10-16 LAB — CBC
HCT: 29.6 % — ABNORMAL LOW (ref 39.0–52.0)
Hemoglobin: 8.5 g/dL — ABNORMAL LOW (ref 13.0–17.0)
MCH: 27.4 pg (ref 26.0–34.0)
MCHC: 28.7 g/dL — AB (ref 30.0–36.0)
MCV: 95.5 fL (ref 80.0–100.0)
PLATELETS: 235 10*3/uL (ref 150–400)
RBC: 3.1 MIL/uL — ABNORMAL LOW (ref 4.22–5.81)
RDW: 15.5 % (ref 11.5–15.5)
WBC: 7.3 10*3/uL (ref 4.0–10.5)
nRBC: 0 % (ref 0.0–0.2)

## 2018-10-16 LAB — GLUCOSE, CAPILLARY
GLUCOSE-CAPILLARY: 223 mg/dL — AB (ref 70–99)
GLUCOSE-CAPILLARY: 69 mg/dL — AB (ref 70–99)
Glucose-Capillary: 118 mg/dL — ABNORMAL HIGH (ref 70–99)
Glucose-Capillary: 154 mg/dL — ABNORMAL HIGH (ref 70–99)

## 2018-10-16 LAB — BASIC METABOLIC PANEL
Anion gap: 5 (ref 5–15)
BUN: 19 mg/dL (ref 8–23)
BUN: 17 mg/dL (ref 8–23)
CALCIUM: 8.4 mg/dL — AB (ref 8.9–10.3)
CHLORIDE: 92 mmol/L — AB (ref 98–111)
CO2: 50 mmol/L — AB (ref 22–32)
CREATININE: 0.81 mg/dL (ref 0.61–1.24)
CREATININE: 0.78 mg/dL (ref 0.61–1.24)
Calcium: 8.2 mg/dL — ABNORMAL LOW (ref 8.9–10.3)
Chloride: 91 mmol/L — ABNORMAL LOW (ref 98–111)
GFR calc non Af Amer: >60 mL/min (ref >60)
GFR calc non Af Amer: >60 mL/min (ref >60)
Glucose, Bld: 105 mg/dL — ABNORMAL HIGH (ref 70–99)
Glucose, Bld: 165 mg/dL — ABNORMAL HIGH (ref 70–99)
Potassium: 3.2 mmol/L — ABNORMAL LOW (ref 3.5–5.1)
Potassium: 3.2 mmol/L — ABNORMAL LOW (ref 3.5–5.1)
SODIUM: 146 mmol/L — AB (ref 135–145)
Sodium: 147 mmol/L — ABNORMAL HIGH (ref 135–145)

## 2018-10-16 LAB — BLOOD GAS, ARTERIAL
ACID-BASE EXCESS: 21.7 mmol/L — AB (ref 0.0–2.0)
BICARBONATE: 49.3 mmol/L — AB (ref 20.0–28.0)
Drawn by: 295031
O2 Content: 2 L/min
O2 Saturation: 95.9 %
PCO2 ART: 79.8 mmHg — AB (ref 32.0–48.0)
PH ART: 7.408 (ref 7.350–7.450)
Patient temperature: 98.6
pO2, Arterial: 80.8 mmHg — ABNORMAL LOW (ref 83.0–108.0)

## 2018-10-16 MED ORDER — POTASSIUM CHLORIDE CRYS ER 20 MEQ PO TBCR
40.0000 meq | EXTENDED_RELEASE_TABLET | Freq: Once | ORAL | Status: AC
  Administered 2018-10-16: 40 meq via ORAL
  Filled 2018-10-16: qty 2

## 2018-10-16 MED ORDER — HYDROCORTISONE 5 MG PO TABS
5.0000 mg | ORAL_TABLET | Freq: Every day | ORAL | Status: DC
  Administered 2018-10-17: 5 mg via ORAL
  Filled 2018-10-16: qty 1

## 2018-10-16 MED ORDER — POTASSIUM CHLORIDE CRYS ER 20 MEQ PO TBCR: 40.0000 meq | EXTENDED_RELEASE_TABLET | ORAL | Status: DC

## 2018-10-16 NOTE — Consult Note (Signed)
   [facility] CM Inpatient Consult   10/16/2018  HASHIM EICHHORST 1941-11-09 [id number]    Patient well known to [place] [facility]. Mr. Silbernagel was recently at [facility]. Mr. [place]hoite currently in stepdown unit at [facility]. Writer following for disposition planning and progression.   Marthenia Rolling, MSN-Ed, RN,BSN [facility] Liaison [phone]

## 2018-10-16 NOTE — Progress Notes (Addendum)
PROGRESS NOTE    [name]  VZC:[id number] DOB: 1941-10-18 DOA: 10/14/2018 PCP: [place] Bing, DO   Brief Narrative: [name] is a 77 y.o. male with medical history significant of  CDM, DM, GIB, PE. Patient presented with shortness of breath likely secondary to fluid overload from acute heart failure.   Assessment & Plan:   Principal Problem:   Acute respiratory failure with hypoxia (HCC) Active Problems:   Morbid obesity with body mass index of 50.0-59.9 in adult [facility])   Essential hypertension   DM2 (diabetes mellitus, type 2) (HCC)   Elephantiasis nostra verrucosa   Pressure ulcer of upper thigh, right, stage III ([place]) present before admit   Hypernatremia   Hypokalemia   History of pulmonary embolism   History of duodenal ulcer 07/2018   Acute on chronic respiratory failure Unknown of how much patient uses at baseline, but he states he has oxygen as an outpatient. Was on Bipap overnight but appears improved today, yet, not likely at baseline. CO2 significantly elevated above baseline and continues to rise. -Wean to room air as able -Watch for continued increase, concerning for worsening respiratory acidosis. -Incentive spirometry  Acute on chronic diastolic heart failure Patient with some pleural effusions and respiratory failure. Improved with lasix. -Hold lasix in setting of potential contraction alkalosis  Alkalosis Unsure if primary metabolic from lasix or compensatory. -ABG -Hold lasix  Fecal occult positive blood test In setting of Eliquis use for recent PE. History of EGDs significant for gastritis and duodenal ulcers.  Anemia Chronic. History of upper GI bleeding. Hemoglobin stable.  Essential hypertension Moderately well controlled. -Continue metoprolol  COPD -Continue Pulmicort  Adrenal insufficiency This is a questionable diagnosis. Patient started on  -Continue Cortef  Pressure ulcers Present on admission. Stage  II  Hypernatremia Likely secondary to poor oral intake. Improving. -Repeat BMP this afternoon  Hyperlipidemia -Continue pravastatin  Diabetes mellitus, insulin dependent -Continue SSI  Hypokalemia Magnesium low. Repleted but still low this morning -Potassium supplementation -Recheck BMP this afternoon  Hypomagnesemia S/p mag supplementation  Pulmonary embolism Recent from one month ago. Currently on Eliquis as an outpatient.  Hypokalemia Severe. Potassium of 2.5 last night. Even with repletion, only up to 2.6 today. Likely worsened in setting of diuresis. -Repeat BMP -Potassium supplementation today  Obesity Body mass index is 41.5 kg/m.   DVT prophylaxis: Eliquis Code Status:   Code Status: Partial Code Family Communication: None at bedside Disposition Plan: Discharge back to SNF when medically ready   Consultants:   None  Procedures:   Bipap  Antimicrobials:  None    Subjective: No dyspnea or chest pain.  Objective: Vitals:   10/16/18 0731 10/16/18 0800 10/16/18 0804 10/16/18 1052  BP:  (!) 143/92  (!) 154/84  Pulse:  66  68  Resp:  14  14  Temp:   97.9 F (36.6 C)   TempSrc:   Oral   SpO2: 96% 98%  100%  Weight:      Height:        Intake/Output Summary (Last 24 hours) at 10/16/2018 1227 Last data filed at 10/16/2018 1100 Gross per 24 hour  Intake 680 ml  Output 1160 ml  Net -480 ml   Filed Weights   10/14/18 1639 10/16/18 0344  Weight: (!) 140.5 kg (!) 138.8 kg    Examination:  General exam: Appears calm and comfortable Respiratory system: Diminished bilaterally with basilar rales. Respiratory effort normal. Cardiovascular system: S1 & S2 heard, RRR. No murmurs,  rubs, gallops or clicks. Gastrointestinal system: Abdomen is nondistended, soft and nontender. Normal bowel sounds heard. Central nervous system: Alert and oriented. No focal neurological deficits. Extremities: No calf tenderness Skin: No cyanosis. Psychiatry:  Judgement and insight appear normal. Mood & affect appropriate.     Data Reviewed: I have personally reviewed following labs and imaging studies  CBC: Recent Labs  Lab 10/14/18 1733 10/15/18 0323 10/15/18 1632 10/16/18 0332  WBC 7.0 7.1 7.2 7.3  NEUTROABS 5.8  --   --   --   HGB 8.9* 7.9* 8.5* 8.5*  HCT 31.1* 28.0* 30.3* 29.6*  MCV 97.2 96.6 96.8 95.5  PLT 241 207 243 973   Basic Metabolic Panel: Recent Labs  Lab 10/14/18 1733 10/15/18 0826 10/15/18 1632 10/16/18 0332  NA 152* 150* 148* 147*  K 2.5* 2.6* 2.9* 3.2*  CL 93* 91* 89* 92*  CO2 45* 47* 46* 50*  GLUCOSE 165* 111* 186* 105*  BUN 23 20 18 19   CREATININE 1.01 0.90 0.96 0.81  CALCIUM 8.8* 8.5* 8.5* 8.2*  MG 1.5*  --   --   --    GFR: Estimated Creatinine Clearance: 108.5 mL/min (by C-G formula based on SCr of 0.81 mg/dL). Liver Function Tests: Recent Labs  Lab 10/14/18 1733  AST 15  ALT 15  ALKPHOS 138*  BILITOT 0.7  PROT 7.0  ALBUMIN 2.7*   No results for input(s): LIPASE, AMYLASE in the last 168 hours. No results for input(s): AMMONIA in the last 168 hours. Coagulation Profile: No results for input(s): INR, PROTIME in the last 168 hours. Cardiac Enzymes: Recent Labs  Lab 10/14/18 1733  TROPONINI <0.03   BNP (last 3 results) No results for input(s): PROBNP in the last 8760 hours. HbA1C: Recent Labs    10/15/18 0323  HGBA1C 5.3   CBG: Recent Labs  Lab 10/15/18 1104 10/15/18 1530 10/15/18 2141 10/15/18 2212 10/16/18 0751  GLUCAP 106* 198* 69* 116* 118*   Lipid Profile: No results for input(s): CHOL, HDL, LDLCALC, TRIG, CHOLHDL, LDLDIRECT in the last 72 hours. Thyroid Function Tests: No results for input(s): TSH, T4TOTAL, FREET4, T3FREE, THYROIDAB in the last 72 hours. Anemia Panel: No results for input(s): VITAMINB12, FOLATE, FERRITIN, TIBC, IRON, RETICCTPCT in the last 72 hours. Sepsis Labs: Recent Labs  Lab 10/14/18 2149  PROCALCITON <0.10    No results found for this  or any previous visit (from the past 240 hour(s)).       Radiology Studies: Ct Chest Wo Contrast  Result Date: 10/14/2018 CLINICAL DATA:  Decreased oxygen saturation. Responded oxygen therapy. EXAM: CT CHEST WITHOUT CONTRAST TECHNIQUE: Multidetector CT imaging of the chest was performed following the standard protocol without IV contrast. COMPARISON:  09/02/2018 FINDINGS: Cardiovascular: Evaluation of vascular structures is limited without IV contrast material. Heart size is normal. No pericardial effusions. Coronary artery and aortic valve calcifications. Normal caliber of the thoracic aorta with scattered calcification. Mediastinum/Nodes: Esophagus is decompressed. No significant lymphadenopathy in the chest. Lungs/Pleura: Small bilateral pleural effusions and basilar consolidation, greater on the right. This may indicate atelectasis or pneumonia. There is progression since the previous study. No pneumothorax. Airways are patent. Upper Abdomen: No acute changes demonstrated in the visualized upper abdomen. Musculoskeletal: No chest wall mass or suspicious bone lesions identified. IMPRESSION: Small bilateral pleural effusions with basilar consolidation, greater on the right. This may indicate atelectasis or pneumonia. Progression since previous study Aortic Atherosclerosis (ICD10-I70.0). Electronically Signed   By: Lucienne Capers M.D.   On: 10/14/2018 21:15  Dg Chest Portable 1 View  Result Date: 10/14/2018 CLINICAL DATA:  Shortness of breath EXAM: PORTABLE CHEST 1 VIEW COMPARISON:  09/25/2018 FINDINGS: Haziness of the bilateral chest that is likely atelectasis and pleural fluid. Cardiomegaly and aortic tortuosity. No air bronchogram or cephalized blood flow. IMPRESSION: Hazy opacities at both bases, also seen 09/25/2018. This is likely atelectasis and pleural fluid, which was seen on a 09/02/2018 chest CT. There could be superimposed pneumonia. Cardiomegaly. Electronically Signed   By: Monte Fantasia M.D.   On: 10/14/2018 19:05        Scheduled Meds: . apixaban  5 mg Oral BID  . budesonide (PULMICORT) nebulizer solution  0.5 mg Nebulization BID  . chlorhexidine  15 mL Mouth Rinse BID  . collagenase  1 application Topical Daily  . famotidine  40 mg Oral Daily  . feeding supplement  1 Container Oral BID BM  . feeding supplement (PRO-STAT SUGAR FREE 64)  30 mL Oral BID  . ferrous sulfate  325 mg Oral BID WC  . fluticasone  2 spray Each Nare Daily  . hydrocortisone  10 mg Oral Daily  . insulin aspart  0-15 Units Subcutaneous TID WC  . latanoprost  1 drop Both Eyes QHS  . mouth rinse  15 mL Mouth Rinse q12n4p  . metoprolol tartrate  12.5 mg Oral BID  . multivitamin with minerals  1 tablet Oral Daily  . pantoprazole  40 mg Oral BID AC  . pravastatin  40 mg Oral Daily  . sucralfate  1 g Oral TID WC & HS  . tamsulosin  0.8 mg Oral Daily   Continuous Infusions:   LOS: 2 days     Cordelia Poche, MD [facility] 10/16/2018, 12:27 PM  If 7PM-7AM, please contact night-coverage [email or web address]

## 2018-10-17 DIAGNOSIS — L89150 Pressure ulcer of sacral region, unstageable: Secondary | ICD-10-CM | POA: Diagnosis not present

## 2018-10-17 DIAGNOSIS — R031 Nonspecific low blood-pressure reading: Secondary | ICD-10-CM | POA: Diagnosis not present

## 2018-10-17 DIAGNOSIS — J449 Chronic obstructive pulmonary disease, unspecified: Secondary | ICD-10-CM | POA: Diagnosis not present

## 2018-10-17 DIAGNOSIS — D649 Anemia, unspecified: Secondary | ICD-10-CM | POA: Diagnosis not present

## 2018-10-17 DIAGNOSIS — W19XXXA Unspecified fall, initial encounter: Secondary | ICD-10-CM | POA: Diagnosis not present

## 2018-10-17 DIAGNOSIS — E559 Vitamin D deficiency, unspecified: Secondary | ICD-10-CM | POA: Diagnosis not present

## 2018-10-17 DIAGNOSIS — F4322 Adjustment disorder with anxiety: Secondary | ICD-10-CM | POA: Diagnosis not present

## 2018-10-17 DIAGNOSIS — Z794 Long term (current) use of insulin: Secondary | ICD-10-CM | POA: Diagnosis not present

## 2018-10-17 DIAGNOSIS — Z23 Encounter for immunization: Secondary | ICD-10-CM | POA: Diagnosis not present

## 2018-10-17 DIAGNOSIS — D72829 Elevated white blood cell count, unspecified: Secondary | ICD-10-CM | POA: Diagnosis not present

## 2018-10-17 DIAGNOSIS — R0689 Other abnormalities of breathing: Secondary | ICD-10-CM | POA: Diagnosis not present

## 2018-10-17 DIAGNOSIS — Z8719 Personal history of other diseases of the digestive system: Secondary | ICD-10-CM | POA: Diagnosis not present

## 2018-10-17 DIAGNOSIS — R609 Edema, unspecified: Secondary | ICD-10-CM | POA: Diagnosis not present

## 2018-10-17 DIAGNOSIS — J9621 Acute and chronic respiratory failure with hypoxia: Secondary | ICD-10-CM | POA: Diagnosis not present

## 2018-10-17 DIAGNOSIS — E119 Type 2 diabetes mellitus without complications: Secondary | ICD-10-CM | POA: Diagnosis not present

## 2018-10-17 DIAGNOSIS — E873 Alkalosis: Secondary | ICD-10-CM | POA: Diagnosis not present

## 2018-10-17 DIAGNOSIS — N390 Urinary tract infection, site not specified: Secondary | ICD-10-CM | POA: Diagnosis not present

## 2018-10-17 DIAGNOSIS — I5043 Acute on chronic combined systolic (congestive) and diastolic (congestive) heart failure: Secondary | ICD-10-CM | POA: Diagnosis not present

## 2018-10-17 DIAGNOSIS — Z452 Encounter for adjustment and management of vascular access device: Secondary | ICD-10-CM | POA: Diagnosis not present

## 2018-10-17 DIAGNOSIS — Z6841 Body Mass Index (BMI) 40.0 and over, adult: Secondary | ICD-10-CM | POA: Diagnosis not present

## 2018-10-17 DIAGNOSIS — R5381 Other malaise: Secondary | ICD-10-CM | POA: Diagnosis not present

## 2018-10-17 DIAGNOSIS — J9602 Acute respiratory failure with hypercapnia: Secondary | ICD-10-CM | POA: Diagnosis not present

## 2018-10-17 DIAGNOSIS — I8291 Chronic embolism and thrombosis of unspecified vein: Secondary | ICD-10-CM | POA: Diagnosis not present

## 2018-10-17 DIAGNOSIS — I5022 Chronic systolic (congestive) heart failure: Secondary | ICD-10-CM | POA: Diagnosis not present

## 2018-10-17 DIAGNOSIS — L89893 Pressure ulcer of other site, stage 3: Secondary | ICD-10-CM | POA: Diagnosis not present

## 2018-10-17 DIAGNOSIS — R5383 Other fatigue: Secondary | ICD-10-CM | POA: Diagnosis not present

## 2018-10-17 DIAGNOSIS — M358 Other specified systemic involvement of connective tissue: Secondary | ICD-10-CM | POA: Diagnosis not present

## 2018-10-17 DIAGNOSIS — Z9989 Dependence on other enabling machines and devices: Secondary | ICD-10-CM | POA: Diagnosis not present

## 2018-10-17 DIAGNOSIS — M6281 Muscle weakness (generalized): Secondary | ICD-10-CM | POA: Diagnosis not present

## 2018-10-17 DIAGNOSIS — E876 Hypokalemia: Secondary | ICD-10-CM | POA: Diagnosis not present

## 2018-10-17 DIAGNOSIS — E440 Moderate protein-calorie malnutrition: Secondary | ICD-10-CM | POA: Diagnosis not present

## 2018-10-17 DIAGNOSIS — R269 Unspecified abnormalities of gait and mobility: Secondary | ICD-10-CM | POA: Diagnosis not present

## 2018-10-17 DIAGNOSIS — K269 Duodenal ulcer, unspecified as acute or chronic, without hemorrhage or perforation: Secondary | ICD-10-CM | POA: Diagnosis not present

## 2018-10-17 DIAGNOSIS — R748 Abnormal levels of other serum enzymes: Secondary | ICD-10-CM | POA: Diagnosis not present

## 2018-10-17 DIAGNOSIS — G4733 Obstructive sleep apnea (adult) (pediatric): Secondary | ICD-10-CM | POA: Diagnosis not present

## 2018-10-17 DIAGNOSIS — E870 Hyperosmolality and hypernatremia: Secondary | ICD-10-CM | POA: Diagnosis not present

## 2018-10-17 DIAGNOSIS — I10 Essential (primary) hypertension: Secondary | ICD-10-CM | POA: Diagnosis not present

## 2018-10-17 DIAGNOSIS — R4182 Altered mental status, unspecified: Secondary | ICD-10-CM | POA: Diagnosis not present

## 2018-10-17 DIAGNOSIS — J9601 Acute respiratory failure with hypoxia: Secondary | ICD-10-CM | POA: Diagnosis not present

## 2018-10-17 DIAGNOSIS — R1084 Generalized abdominal pain: Secondary | ICD-10-CM | POA: Diagnosis not present

## 2018-10-17 DIAGNOSIS — Z7401 Bed confinement status: Secondary | ICD-10-CM | POA: Diagnosis not present

## 2018-10-17 DIAGNOSIS — Z86711 Personal history of pulmonary embolism: Secondary | ICD-10-CM | POA: Diagnosis not present

## 2018-10-17 DIAGNOSIS — E1140 Type 2 diabetes mellitus with diabetic neuropathy, unspecified: Secondary | ICD-10-CM | POA: Diagnosis not present

## 2018-10-17 DIAGNOSIS — R0602 Shortness of breath: Secondary | ICD-10-CM | POA: Diagnosis not present

## 2018-10-17 DIAGNOSIS — E1151 Type 2 diabetes mellitus with diabetic peripheral angiopathy without gangrene: Secondary | ICD-10-CM | POA: Diagnosis not present

## 2018-10-17 DIAGNOSIS — R0609 Other forms of dyspnea: Secondary | ICD-10-CM | POA: Diagnosis not present

## 2018-10-17 DIAGNOSIS — R05 Cough: Secondary | ICD-10-CM | POA: Diagnosis not present

## 2018-10-17 DIAGNOSIS — M2550 Pain in unspecified joint: Secondary | ICD-10-CM | POA: Diagnosis not present

## 2018-10-17 DIAGNOSIS — M79606 Pain in leg, unspecified: Secondary | ICD-10-CM | POA: Diagnosis not present

## 2018-10-17 DIAGNOSIS — E1165 Type 2 diabetes mellitus with hyperglycemia: Secondary | ICD-10-CM | POA: Diagnosis not present

## 2018-10-17 DIAGNOSIS — I5023 Acute on chronic systolic (congestive) heart failure: Secondary | ICD-10-CM | POA: Diagnosis not present

## 2018-10-17 DIAGNOSIS — Z7409 Other reduced mobility: Secondary | ICD-10-CM | POA: Diagnosis not present

## 2018-10-17 DIAGNOSIS — R451 Restlessness and agitation: Secondary | ICD-10-CM | POA: Diagnosis not present

## 2018-10-17 DIAGNOSIS — L89153 Pressure ulcer of sacral region, stage 3: Secondary | ICD-10-CM | POA: Diagnosis not present

## 2018-10-17 DIAGNOSIS — S82209A Unspecified fracture of shaft of unspecified tibia, initial encounter for closed fracture: Secondary | ICD-10-CM | POA: Diagnosis not present

## 2018-10-17 DIAGNOSIS — I82891 Chronic embolism and thrombosis of other specified veins: Secondary | ICD-10-CM | POA: Diagnosis not present

## 2018-10-17 DIAGNOSIS — K922 Gastrointestinal hemorrhage, unspecified: Secondary | ICD-10-CM | POA: Diagnosis not present

## 2018-10-17 DIAGNOSIS — E860 Dehydration: Secondary | ICD-10-CM | POA: Diagnosis not present

## 2018-10-17 DIAGNOSIS — I5033 Acute on chronic diastolic (congestive) heart failure: Secondary | ICD-10-CM | POA: Diagnosis not present

## 2018-10-17 DIAGNOSIS — I2699 Other pulmonary embolism without acute cor pulmonale: Secondary | ICD-10-CM | POA: Diagnosis not present

## 2018-10-17 DIAGNOSIS — R109 Unspecified abdominal pain: Secondary | ICD-10-CM | POA: Diagnosis not present

## 2018-10-17 DIAGNOSIS — F05 Delirium due to known physiological condition: Secondary | ICD-10-CM | POA: Diagnosis not present

## 2018-10-17 DIAGNOSIS — L89890 Pressure ulcer of other site, unstageable: Secondary | ICD-10-CM | POA: Diagnosis not present

## 2018-10-17 DIAGNOSIS — R197 Diarrhea, unspecified: Secondary | ICD-10-CM | POA: Diagnosis not present

## 2018-10-17 DIAGNOSIS — R296 Repeated falls: Secondary | ICD-10-CM | POA: Diagnosis not present

## 2018-10-17 LAB — BASIC METABOLIC PANEL
Anion gap: 5 (ref 5–15)
BUN: 14 mg/dL (ref 8–23)
CO2: 49 mmol/L — ABNORMAL HIGH (ref 22–32)
CREATININE: 0.72 mg/dL (ref 0.61–1.24)
Calcium: 8.2 mg/dL — ABNORMAL LOW (ref 8.9–10.3)
Chloride: 87 mmol/L — ABNORMAL LOW (ref 98–111)
GFR calc Af Amer: >60 mL/min (ref >60)
Glucose, Bld: 188 mg/dL — ABNORMAL HIGH (ref 70–99)
POTASSIUM: 3.1 mmol/L — AB (ref 3.5–5.1)
Sodium: 141 mmol/L (ref 135–145)

## 2018-10-17 LAB — GLUCOSE, CAPILLARY
GLUCOSE-CAPILLARY: 135 mg/dL — AB (ref 70–99)
Glucose-Capillary: 148 mg/dL — ABNORMAL HIGH (ref 70–99)
Glucose-Capillary: 171 mg/dL — ABNORMAL HIGH (ref 70–99)

## 2018-10-17 LAB — MAGNESIUM: MAGNESIUM: 1.6 mg/dL — AB (ref 1.7–2.4)

## 2018-10-17 MED ORDER — POLYETHYLENE GLYCOL 3350 17 G PO PACK: 17.0000 g | PACK | Freq: Every day | ORAL | Status: AC | PRN

## 2018-10-17 MED ORDER — POTASSIUM CHLORIDE CRYS ER 20 MEQ PO TBCR
40.0000 meq | EXTENDED_RELEASE_TABLET | Freq: Once | ORAL | Status: AC
  Administered 2018-10-17: 40 meq via ORAL
  Filled 2018-10-17: qty 2

## 2018-10-17 MED ORDER — HYDROCORTISONE 5 MG PO TABS: 5.0000 mg | ORAL_TABLET | Freq: Every day | ORAL | Status: AC

## 2018-10-17 MED ORDER — POTASSIUM CHLORIDE ER 20 MEQ PO TBCR: 20.0000 meq | EXTENDED_RELEASE_TABLET | Freq: Every day | ORAL | Status: AC

## 2018-10-17 MED ORDER — MAGNESIUM SULFATE 2 GM/50ML IV SOLN
2.0000 g | Freq: Once | INTRAVENOUS | Status: AC
  Administered 2018-10-17: 2 g via INTRAVENOUS
  Filled 2018-10-17: qty 50

## 2018-10-17 NOTE — Discharge Summary (Addendum)
Physician Discharge Summary  [name]:[id number] DOB: August 17, 1941 DOA: 10/14/2018  PCP: [place] Bing, DO  Admit date: 10/14/2018 Discharge date: 10/17/2018  Admitted From: SNF Disposition: SNF  Recommendations for Outpatient Follow-up:  1. Follow up with PCP in 1 week 2. Follow up with pulmonology for CPAP use to treat OSA 3. Please obtain BMP/CBC in 2-3 days 4. Please follow up on the following pending results: None  Home Health: None Equipment/Devices: CPAP  Discharge Condition: Stable CODE STATUS: No CPR, no Intubation, no defibrillation. Diet recommendation: Heart healthy   Brief/Interim Summary:  Admission HPI written by Shelbie Proctor, MD   Chief Complaint: SOB  HPI: [name] is a 77 y.o. male with medical history significant of  CDM, DM, GIB, PE reports with Sob and Hypoxia at SNF.  Really reported O2 sats dropping to 50% was placed on a nonrebreather.  When EMS arrived arrived his SPO2 is back to 100%.  Patient complained of a cough with some mild sputum production.  He denies any fever.  Patient has chronic edema in his legs unsure of they have changed.  He states he just started doing his rehab.  Patient has a history of pulmonary embolism and is on Eliquis.  She was admitted in August for a long stent for a GI bleed due to a duodenal ulcer while on anticoagulation.  Was collocated by anemia requiring transfusion as well as hypotension requiring reversal of his anticoagulation and a stay in the ICU. ED Course:  CXR unequivocal.  He was afebrile here and did not have a leukocytosis.  Given IV lasix and K+. Placed on Bipap with improvement in WOB.  Patient was heme positive here but there was no frank melena.  Hemoglobin stable at 8.9.    Hospital course:  Acute on chronic respiratory failure with hypoxia and hypercapnia Patient treated with Bipap with improvement. Refused CPAP while inpatient but states he will use when he leaves. Serum  bicarb significantly elevated above baseline in response to respiratory acidosis which is compensated. Wean oxygen as able. Recommendation for patient to continue CPAP as an outpatient with pulmonology follow-up. Pneumonia unlikely with undetectable procalcitonin, normal WBC, no sputum production.  Acute on chronic diastolic heart failure Patient with some pleural effusions and respiratory failure. Improved with lasix. Resume home lasix. Add potassium supplementation.  Metabolic alkalosis In response to respiratory acidosis. PH is compensated.    Fecal occult positive blood test In setting of Eliquis use for recent PE. History of EGDs significant for gastritis and duodenal ulcers. Hemoglobin stable.  Anemia Chronic. History of upper GI bleeding. Hemoglobin stable.  Essential hypertension Moderately well controlled. Continued metoprolol.  COPD Continue inhalers  Adrenal insufficiency This is a questionable diagnosis. Patient started on Cortef from previous admission. Decreased to 5 mg dose. Recommend continued slow taper.  Pressure ulcers Assessed by wound care. Appear to be healed.  Hypernatremia Likely secondary to poor oral intake. Resolved.  Hyperlipidemia Continued pravastatin  Diabetes mellitus, insulin dependent Sliding scale insulin while inpatient. Resume on discharge.  Hypokalemia Magnesium low. Supplementation as needed. Discharge with potassium supplementation. Recheck BMP in 2-3 days  Hypomagnesemia Supplementation.  Pulmonary embolism Recent from one month ago. Currently on Eliquis as an outpatient.  Obesity Body mass index is 39.71 kg/m.  Discharge Diagnoses:  Principal Problem:   Acute respiratory failure with hypoxia (HCC) Active Problems:   Morbid obesity with body mass index of 50.0-59.9 in adult [facility])   Essential hypertension  DM2 (diabetes mellitus, type 2) ([place])   Elephantiasis nostra verrucosa   Pressure ulcer of upper  thigh, right, stage III ([place]) present before admit   Hypernatremia   Hypokalemia   History of pulmonary embolism   History of duodenal ulcer 07/2018    Discharge Instructions   Allergies as of 10/17/2018      Reactions   Ace Inhibitors Cough   With Lisinopril.   Angiotensin Receptor Blockers Other (See Comments)   Caused excessive weight gain   Metformin Diarrhea      Medication List    TAKE these medications   apixaban 5 MG Tabs tablet Commonly known as:  ELIQUIS Take 1 tablet (5 mg total) by mouth 2 (two) times daily.   collagenase ointment Commonly known as:  SANTYL Apply 1 application topically daily. Apply to right rear thigh topically every day shift for affwound care apply with calcium alginate   DERMACLOUD Crea Apply 1 application topically 3 (three) times daily.   famotidine 20 MG tablet Commonly known as:  PEPCID Take 2 tablets (40 mg total) by mouth daily.   feeding supplement (PRO-STAT SUGAR FREE 64) Liqd Take 30 mLs by mouth 2 (two) times daily.   ferrous sulfate 325 (65 FE) MG tablet Take 1 tablet (325 mg total) by mouth 2 (two) times daily with a meal.   FLONASE 50 MCG/ACT nasal spray Generic drug:  fluticasone Place 2 sprays into both nostrils daily.   FLOVENT HFA 220 MCG/ACT inhaler Generic drug:  fluticasone INHALE 1 PUFF INTO THE LUNGS TWICE DAILY What changed:  See the new instructions.   furosemide 40 MG tablet Commonly known as:  LASIX Take 1 tablet (40 mg total) by mouth daily.   guaiFENesin 100 MG/5ML liquid Commonly known as:  ROBITUSSIN Take 200 mg by mouth 3 (three) times daily as needed for cough. Take 56ml by mouth every 8 hours for cough/congestion for 7 days   hydrocortisone 5 MG tablet Commonly known as:  CORTEF Take 1 tablet (5 mg total) by mouth daily. Start taking on:  10/18/2018 What changed:    medication strength  how much to take   insulin aspart 100 UNIT/ML injection Commonly known as:  novoLOG CBG 70 -  120: 0 units CBG 121 - 150: 2 units CBG 151 - 200: 3 units CBG 201 - 250: 5 units CBG 251 - 300: 8 units CBG 301 - 350: 11 units CBG 351 - 400: 15 units   latanoprost 0.005 % ophthalmic solution Commonly known as:  XALATAN INSTILL 1 DROP IN BOTH EYES EVERY EVENING FOR GLAUCOMA What changed:  See the new instructions.   metoprolol tartrate 25 MG tablet Commonly known as:  LOPRESSOR Take 0.5 tablets (12.5 mg total) by mouth 2 (two) times daily.   nystatin powder Commonly known as:  MYCOSTATIN/NYSTOP Apply topically 2 (two) times daily.   pantoprazole 40 MG tablet Commonly known as:  PROTONIX Take 1 tablet (40 mg total) by mouth 2 (two) times daily before a meal.   polyethylene glycol packet Commonly known as:  MIRALAX / GLYCOLAX Take 17 g by mouth daily as needed for mild constipation.   Potassium Chloride ER 20 MEQ Tbcr Take 20 mEq by mouth daily. Use when taking Lasix Start taking on:  10/18/2018   pravastatin 40 MG tablet Commonly known as:  PRAVACHOL TAKE 1 TABLET BY MOUTH DAILY   PROAIR HFA 108 (90 Base) MCG/ACT inhaler Generic drug:  albuterol INHALE 2 PUFFS BY MOUTH EVERY 4 HOURS  AS NEEDED FOR WHEEZING OR SHORTNESS OF BREATH What changed:  See the new instructions.   PROBIOTIC PO Take 2 capsules by mouth daily.   sucralfate 1 g tablet Commonly known as:  CARAFATE Take 1 tablet (1 g total) by mouth 4 (four) times daily -  with meals and at bedtime.   tamsulosin 0.4 MG Caps capsule Commonly known as:  FLOMAX TAKE 2 CAPSULES BY MOUTH EVERY MORNING What changed:  when to take this   urea 10 % lotion Apply topically 2 (two) times daily.       Allergies  Allergen Reactions  . Ace Inhibitors Cough    With Lisinopril.  . Angiotensin Receptor Blockers Other (See Comments)    Caused excessive weight gain  . Metformin Diarrhea    Consultations:  None   Procedures/Studies: Ct Head Wo Contrast  Result Date: 09/27/2018 CLINICAL DATA:  Altered mental  status EXAM: CT HEAD WITHOUT CONTRAST TECHNIQUE: Contiguous axial images were obtained from the base of the skull through the vertex without intravenous contrast. COMPARISON:  None. FINDINGS: Brain: There is no mass, hemorrhage or extra-axial collection. The size and configuration of the ventricles and extra-axial CSF spaces are normal. There is no acute or chronic infarction. There is hypoattenuation of the periventricular white matter, most commonly indicating chronic ischemic microangiopathy. Vascular: No abnormal hyperdensity of the major intracranial arteries or dural venous sinuses. No intracranial atherosclerosis. Skull: The visualized skull base, calvarium and extracranial soft tissues are normal. Sinuses/Orbits: No fluid levels or advanced mucosal thickening of the visualized paranasal sinuses. No mastoid or middle ear effusion. The orbits are normal. IMPRESSION: Chronic small vessel disease without acute intracranial abnormality. Electronically Signed   By: Ulyses Jarred M.D.   On: 09/27/2018 20:27   Ct Chest Wo Contrast  Result Date: 10/14/2018 CLINICAL DATA:  Decreased oxygen saturation. Responded oxygen therapy. EXAM: CT CHEST WITHOUT CONTRAST TECHNIQUE: Multidetector CT imaging of the chest was performed following the standard protocol without IV contrast. COMPARISON:  09/02/2018 FINDINGS: Cardiovascular: Evaluation of vascular structures is limited without IV contrast material. Heart size is normal. No pericardial effusions. Coronary artery and aortic valve calcifications. Normal caliber of the thoracic aorta with scattered calcification. Mediastinum/Nodes: Esophagus is decompressed. No significant lymphadenopathy in the chest. Lungs/Pleura: Small bilateral pleural effusions and basilar consolidation, greater on the right. This may indicate atelectasis or pneumonia. There is progression since the previous study. No pneumothorax. Airways are patent. Upper Abdomen: No acute changes demonstrated in  the visualized upper abdomen. Musculoskeletal: No chest wall mass or suspicious bone lesions identified. IMPRESSION: Small bilateral pleural effusions with basilar consolidation, greater on the right. This may indicate atelectasis or pneumonia. Progression since previous study Aortic Atherosclerosis (ICD10-I70.0). Electronically Signed   By: Lucienne Capers M.D.   On: 10/14/2018 21:15   Dg Chest Port 1 View  Result Date: 10/16/2018 CLINICAL DATA:  Severe shortness of breath. EXAM: PORTABLE CHEST 1 VIEW COMPARISON:  Chest x-ray and CT chest dated October 14, 2018. FINDINGS: Stable cardiomegaly. Normal pulmonary vascularity. Unchanged small bilateral pleural effusions with bibasilar airspace disease. No pneumothorax. No acute osseous abnormality. IMPRESSION: 1. Unchanged small bilateral pleural effusions with bibasilar atelectasis and/or pneumonia. Electronically Signed   By: Titus Dubin M.D.   On: 10/16/2018 13:04   Dg Chest Portable 1 View  Result Date: 10/14/2018 CLINICAL DATA:  Shortness of breath EXAM: PORTABLE CHEST 1 VIEW COMPARISON:  09/25/2018 FINDINGS: Haziness of the bilateral chest that is likely atelectasis and pleural fluid. Cardiomegaly and aortic  tortuosity. No air bronchogram or cephalized blood flow. IMPRESSION: Hazy opacities at both bases, also seen 09/25/2018. This is likely atelectasis and pleural fluid, which was seen on a 09/02/2018 chest CT. There could be superimposed pneumonia. Cardiomegaly. Electronically Signed   By: Monte Fantasia M.D.   On: 10/14/2018 19:05   Dg Chest Port 1 View  Result Date: 09/25/2018 CLINICAL DATA:  Fever. EXAM: PORTABLE CHEST 1 VIEW COMPARISON:  09/15/2018. FINDINGS: Interval removal of right PICC line. Cardiomegaly with diffuse bilateral pulmonary infiltrates and bilateral pleural effusions consistent CHF. Bilateral pneumonia cannot be excluded. No pneumothorax. IMPRESSION: 1. Cardiomegaly with diffuse bilateral pulmonary infiltrates and  bilateral pleural effusions most consistent with CHF. Bilateral pneumonia cannot be excluded. Electronically Signed   By: Marcello Moores  Register   On: 09/25/2018 15:45      Subjective: No dyspnea or chest pain.  Discharge Exam: Vitals:   10/17/18 0814 10/17/18 1304  BP:  (!) 123/54  Pulse:  64  Resp:  20  Temp:  98.2 F (36.8 C)  SpO2: 95% 100%   Vitals:   10/17/18 0626 10/17/18 0810 10/17/18 0814 10/17/18 1304  BP: 115/67   (!) 123/54  Pulse: 63   64  Resp: (!) 21   20  Temp: 97.6 F (36.4 C)   98.2 F (36.8 C)  TempSrc: Oral   Oral  SpO2: 100% 96% 95% 100%  Weight:      Height:        General: Pt is alert, awake, not in acute distress Cardiovascular: RRR, S1/S2 +, no rubs, no gallops Respiratory: CTA bilaterally, no wheezing, no rhonchi Abdominal: Soft, NT, ND, bowel sounds + Extremities: no cyanosis    The results of significant diagnostics from this hospitalization (including imaging, microbiology, ancillary and laboratory) are listed below for reference.     Microbiology: No results found for this or any previous visit (from the past 240 hour(s)).   Labs: BNP (last 3 results) Recent Labs    08/25/18 0914 09/14/18 1549 10/14/18 1733  BNP 124.2* 477.7* 001.7*   Basic Metabolic Panel: Recent Labs  Lab 10/14/18 1733 10/15/18 0826 10/15/18 1632 10/16/18 0332 10/16/18 1444 10/17/18 1101  NA 152* 150* 148* 147* 146* 141  K 2.5* 2.6* 2.9* 3.2* 3.2* 3.1*  CL 93* 91* 89* 92* 91* 87*  CO2 45* 47* 46* 50* >50* 49*  GLUCOSE 165* 111* 186* 105* 165* 188*  BUN 23 20 18 19 17 14   CREATININE 1.01 0.90 0.96 0.81 0.78 0.72  CALCIUM 8.8* 8.5* 8.5* 8.2* 8.4* 8.2*  MG 1.5*  --   --   --   --  1.6*   Liver Function Tests: Recent Labs  Lab 10/14/18 1733  AST 15  ALT 15  ALKPHOS 138*  BILITOT 0.7  PROT 7.0  ALBUMIN 2.7*   No results for input(s): LIPASE, AMYLASE in the last 168 hours. No results for input(s): AMMONIA in the last 168 hours. CBC: Recent  Labs  Lab 10/14/18 1733 10/15/18 0323 10/15/18 1632 10/16/18 0332  WBC 7.0 7.1 7.2 7.3  NEUTROABS 5.8  --   --   --   HGB 8.9* 7.9* 8.5* 8.5*  HCT 31.1* 28.0* 30.3* 29.6*  MCV 97.2 96.6 96.8 95.5  PLT 241 207 243 235   Cardiac Enzymes: Recent Labs  Lab 10/14/18 1733  TROPONINI <0.03   BNP: Invalid input(s): POCBNP CBG: Recent Labs  Lab 10/16/18 1239 10/16/18 1652 10/16/18 2053 10/17/18 0738 10/17/18 1129  GLUCAP 154* 223* 69* 148*  171*   D-Dimer No results for input(s): DDIMER in the last 72 hours. Hgb A1c Recent Labs    10/15/18 0323  HGBA1C 5.3   Lipid Profile No results for input(s): CHOL, HDL, LDLCALC, TRIG, CHOLHDL, LDLDIRECT in the last 72 hours. Thyroid function studies No results for input(s): TSH, T4TOTAL, T3FREE, THYROIDAB in the last 72 hours.  Invalid input(s): FREET3 Anemia work up No results for input(s): VITAMINB12, FOLATE, FERRITIN, TIBC, IRON, RETICCTPCT in the last 72 hours. Urinalysis    Component Value Date/Time   COLORURINE AMBER (A) 09/25/2018 1538   APPEARANCEUR TURBID (A) 09/25/2018 1538   LABSPEC 1.015 09/25/2018 1538   PHURINE 7.0 09/25/2018 1538   GLUCOSEU NEGATIVE 09/25/2018 1538   HGBUR MODERATE (A) 09/25/2018 1538   HGBUR negative 06/08/2007 1320   BILIRUBINUR NEGATIVE 09/25/2018 1538   BILIRUBINUR NEG 02/25/2012 1351   KETONESUR NEGATIVE 09/25/2018 1538   PROTEINUR 30 (A) 09/25/2018 1538   UROBILINOGEN 1.0 11/10/2012 2115   NITRITE NEGATIVE 09/25/2018 1538   LEUKOCYTESUR MODERATE (A) 09/25/2018 1538      SIGNED:   Cordelia Poche, MD [facility] 10/17/2018, 3:20 PM

## 2018-10-17 NOTE — Clinical Social Work Note (Signed)
Patient was recently assessed with no notable changes. Please see below. CSW began HTA insurance auth for patient to return to [facility].  Will complete updated FL2 and inform medical staff of insurance auth process.  CSW will continue to follow for discharge coordination.  Pricilla Holm, MSW, [place] Work [phone]   Clinical Social Work Assessment  Patient Details  Name: [name] MRN: [id number] Date of Birth: 07-09-1941  Date of referral:                   Reason for consult:                         Permission sought to share information with:    Permission granted to share information::                Name::      Cheri Rous             Agency::    [facility]             Relationship::   Daughter             Contact Information:    [phone]  Housing/Transportation Living arrangements for the past 2 months:   Single Family/ SNF facility  Source of Information:   Patient and daughter Patient Interpreter Needed:   None Criminal Activity/Legal Involvement Pertinent to Current Situation/Hospitalization:    Significant Relationships:   Daughter  Lives with:   Self Do you feel safe going back to the place where you live?   Yes Need for family participation in patient care:   Yes  Care giving concerns:  Patient admitted for GI Bleed, melanotic stools.  SNF placement at discharge.  No concerns from the patient or daughter. Patient will discharge back to [facility] to continue short rehab and wound care.   Per notes the patient is followed by [facility].   Social Worker assessment / plan:  Assessment was also completed on 8/9. Patient admitted from [facility]. CSW will assist with patient discharge back to [facility] at discharge. CSW explain insurance will need to be authorize therapy at SNF again.  Patient will need FL2 completed.  Plan: SNF placement.   Employment status:   Retired Copywriter, advertising PT Recommendations:   SNF placement Information / Referral to community resources:   None  Patient/Family's Response to care: Agreeable and Responding to care.   Patient/Family's Understanding of and Emotional Response to Diagnosis, Current Treatment, and Prognosis:  Patient is learning about his diagnosis and asking questions about his care. Patient deferred to his daughter for understanding as well.   Emotional Assessment Appearance:   Appears stated Age Attitude/Demeanor/Rapport:   Pleasant Affect (typically observed):    Orientation:   Oriented Self, Oriented Place, Oriented Time,  Oriented Situation Alcohol / Substance use:    Not Applicable  Psych involvement (Current and /or in the community):   No  Discharge Needs  Concerns to be addressed:   Care Coordination Readmission within the last 30 days:   Yes Current discharge risk:   Dependent with mobility Barriers to Discharge:   Continued Medical Work Up/Insurance Authorization

## 2018-10-17 NOTE — Discharge Instructions (Signed)
[facility] were admitted with trouble breathing. You had too much fluid in your lungs. You were placed on Bipap which helped and received lasix via the IV. It is important for you to use your CPAP. This will need to be restarted at the facility.

## 2018-10-17 NOTE — NC FL2 (Signed)
[place] LEVEL OF CARE SCREENING TOOL     IDENTIFICATION  Patient Name: [name] Birthdate: [birth year] Sex: male Admission Date (Current Location): 10/14/2018  [facility] and [place] Number:  Herbalist and Address:  [facility],  [place], [place]      Provider Number: [id number]  Attending Physician Name and Address:  Mariel Aloe, MD  Relative Name and Phone Number:  Cheri Rous: [phone]    Current Level of Care: Hospital Recommended Level of Care: [place] Prior Approval Number:    Date Approved/Denied:   PASRR Number: [id number]  Discharge Plan: SNF    Current Diagnoses: Patient Active Problem List   Diagnosis Date Noted  . Acute respiratory failure with hypoxia ([place]) 10/14/2018  . Hypernatremia 10/14/2018  . Hypokalemia 10/14/2018  . History of pulmonary embolism 10/14/2018  . History of duodenal ulcer 07/2018 10/14/2018  . Abdominal pain   . Leukocytosis   . Elevated alkaline phosphatase measurement   . Systolic CHF, acute on chronic ([place]) 09/11/2018  . Anasarca 09/11/2018  . Acute pulmonary embolism ([place]) 09/11/2018  . Acute respiratory failure ([place])   . Duodenal ulcer   . Malnutrition of moderate degree 08/24/2018  . Hemorrhagic shock ([place])   . Pressure injury of skin 08/20/2018  . Gastrointestinal hemorrhage 08/19/2018  . Acute respiratory failure with hypoxemia ([place])   . Possible OSA; on empiric CPAP 08/03/2018  . Fungus present in urine 08/03/2018  . Abdominal aortic atherosclerosis ([place]) 07/29/2018  . Pyuria 07/29/2018  . Sacral decubitus ulcer, stage III ([place]) 07/29/2018  . Somnolence 07/29/2018  . Acute respiratory acidosis 07/29/2018  . Poor personal hygiene 07/29/2018  . Hemoglobin decreased 07/29/2018  . Sepsis ([place]) 07/28/2018  . Pneumonia 06/04/2018  . Dehydration   . Possible HCAP (healthcare-associated pneumonia)   . Hypotension   . Grade II  diastolic dysfunction   . Fluid overload 05/19/2018  . Elephantiasis nostra verrucosa 05/19/2018  . Pressure ulcer of upper thigh, right, stage III ([place]) present before admit 05/19/2018  . Candidal intertrigo 05/19/2018  . Chronic venous insufficiency, Bilateral Legs 05/19/2018  . Elevated PSA, greater than or equal to 20 ng/ml 05/19/2018  . History of prostate surgery, Per Patient report, [facility] 05/19/2018  . Infestation by bed bug 05/19/2018  . Lives alone with limited help 05/19/2018  . Morbid obesity ([place]) 05/19/2018  . SOB (shortness of breath) 05/19/2018  . Systolic ejection murmur with radiation into Cartoids bilaterally 05/19/2018  . Paget's bone disease 05/19/2018  . Pulmonary hypertension ([place]) 05/19/2018  . Hypoxia   . AKI (acute kidney injury) ([place])   . DM2 (diabetes mellitus, type 2) ([place])   . Impaired mobility 09/09/2014  . Ulcer of left lower leg ([place]) 10/05/2012  . Encounter for central line placement 10/05/2012  . Anemia of chronic disease 05/21/2012  . Chronic leg pain 11/17/2011  . HLD (hyperlipidemia) 12/01/2008  . ADENOCARCINOMA, PROSTATE 05/24/2008  . Morbid obesity with body mass index of 50.0-59.9 in adult ([place]) 03/05/2007  . Essential hypertension 03/05/2007    Orientation RESPIRATION BLADDER Height & Weight     Self, Time, Situation, Place  O2 External catheter Weight: 292 lb 12.3 oz (132.8 kg) Height:  6' (182.9 cm)  BEHAVIORAL SYMPTOMS/MOOD NEUROLOGICAL BOWEL NUTRITION STATUS      Incontinent Diet(Regular)  AMBULATORY STATUS COMMUNICATION OF NEEDS Skin   Extensive Assist Verbally PU Stage and Appropriate Care   PU Stage 2 Dressing: (every 3  days)                   Personal Care Assistance Level of Assistance  Bathing, Feeding, Dressing Bathing Assistance: Maximum assistance Feeding assistance: Limited assistance Dressing Assistance: Maximum assistance     Functional Limitations Info  Speech, Hearing, Sight Sight Info:  Adequate Hearing Info: Adequate Speech Info: Adequate    SPECIAL CARE FACTORS FREQUENCY  OT (By licensed OT), PT (By licensed PT)     PT Frequency: 5x/week OT Frequency: 5x/week            Contractures      Additional Factors Info  Code Status, Allergies Code Status Info: Partial  Allergies Info: ACE INHIBITORS, ANGIOTENSIN RECEPTOR BLOCKERS, METFORMIN            Current Medications (10/17/2018):  This is the current hospital active medication list Current Facility-Administered Medications  Medication Dose Route Frequency Provider Last Rate Last Dose  . acetaminophen (TYLENOL) tablet 650 mg  650 mg Oral Q6H PRN Mariel Aloe, MD       Or  . acetaminophen (TYLENOL) suppository 650 mg  650 mg Rectal Q6H PRN Mariel Aloe, MD      . albuterol (PROVENTIL) (2.5 MG/3ML) 0.083% nebulizer solution 2.5 mg  2.5 mg Nebulization Q4H PRN Mariel Aloe, MD      . apixaban (ELIQUIS) tablet 5 mg  5 mg Oral BID Mariel Aloe, MD   5 mg at 10/17/18 1041  . budesonide (PULMICORT) nebulizer solution 0.5 mg  0.5 mg Nebulization BID Mariel Aloe, MD   0.5 mg at 10/17/18 0810  . chlorhexidine (PERIDEX) 0.12 % solution 15 mL  15 mL Mouth Rinse BID Mariel Aloe, MD   15 mL at 10/17/18 1041  . collagenase (SANTYL) ointment 1 application  1 application Topical Daily Mariel Aloe, MD   1 application at 48/54/62 1044  . famotidine (PEPCID) tablet 40 mg  40 mg Oral Daily Mariel Aloe, MD   40 mg at 10/17/18 1040  . feeding supplement (BOOST / RESOURCE BREEZE) liquid 1 Container  1 Container Oral BID BM Mariel Aloe, MD   1 Container at 10/17/18 1044  . feeding supplement (PRO-STAT SUGAR FREE 64) liquid 30 mL  30 mL Oral BID Mariel Aloe, MD   30 mL at 10/17/18 1407  . ferrous sulfate tablet 325 mg  325 mg Oral BID WC Mariel Aloe, MD   325 mg at 10/17/18 1041  . fluticasone (FLONASE) 50 MCG/ACT nasal spray 2 spray  2 spray Each Nare Daily Mariel Aloe, MD   2 spray at  10/16/18 1050  . guaiFENesin (ROBITUSSIN) 100 MG/5ML solution 200 mg  200 mg Oral TID PRN Mariel Aloe, MD   200 mg at 10/16/18 2342  . hydrocortisone (CORTEF) tablet 5 mg  5 mg Oral Daily Mariel Aloe, MD   5 mg at 10/17/18 1033  . insulin aspart (novoLOG) injection 0-15 Units  0-15 Units Subcutaneous TID WC Mariel Aloe, MD   3 Units at 10/17/18 1400  . latanoprost (XALATAN) 0.005 % ophthalmic solution 1 drop  1 drop Both Eyes QHS Mariel Aloe, MD   1 drop at 10/16/18 2245  . magnesium sulfate IVPB 2 g 50 mL  2 g Intravenous Once Mariel Aloe, MD      . MEDLINE mouth rinse  15 mL Mouth Rinse q12n4p Mariel Aloe, MD   15 mL at  10/17/18 1400  . metoprolol tartrate (LOPRESSOR) tablet 12.5 mg  12.5 mg Oral BID Mariel Aloe, MD   12.5 mg at 10/17/18 1040  . multivitamin with minerals tablet 1 tablet  1 tablet Oral Daily Mariel Aloe, MD   1 tablet at 10/17/18 1040  . ondansetron (ZOFRAN) tablet 4 mg  4 mg Oral Q6H PRN Mariel Aloe, MD       Or  . ondansetron (ZOFRAN) injection 4 mg  4 mg Intravenous Q6H PRN Mariel Aloe, MD      . pantoprazole (PROTONIX) EC tablet 40 mg  40 mg Oral BID AC Mariel Aloe, MD   40 mg at 10/17/18 1041  . polyethylene glycol (MIRALAX / GLYCOLAX) packet 17 g  17 g Oral Daily PRN Mariel Aloe, MD      . pravastatin (PRAVACHOL) tablet 40 mg  40 mg Oral Daily Mariel Aloe, MD   40 mg at 10/16/18 1702  . sucralfate (CARAFATE) tablet 1 g  1 g Oral TID WC & HS Mariel Aloe, MD   1 g at 10/17/18 1405  . tamsulosin (FLOMAX) capsule 0.8 mg  0.8 mg Oral Daily Mariel Aloe, MD   0.8 mg at 10/17/18 1040     Discharge Medications: Please see discharge summary for a list of discharge medications.  Relevant Imaging Results:  Relevant Lab Results:   Additional Information SSN: [id number]  Pricilla Holm, [place]

## 2018-10-17 NOTE — Progress Notes (Signed)
Patient is discharging back to [facility]. CSW confirmed bed availability, insurance auth, and faxed appropriate documents. Patient will need CPAP - facility aware and agreeable to order on Monday.   [facility] will be called for transport.  RN call report: [phone].   Pricilla Holm, MSW, [place] Social Work [phone]

## 2018-10-17 NOTE — Progress Notes (Signed)
[name] to be D/C'd Home per MD order.  Discussed prescriptions and follow up appointments with the patient. Prescriptions given to patient, medication list explained in detail. Pt verbalized understanding.  Allergies as of 10/17/2018      Reactions   Ace Inhibitors Cough   With Lisinopril.   Angiotensin Receptor Blockers Other (See Comments)   Caused excessive weight gain   Metformin Diarrhea      Medication List    TAKE these medications   apixaban 5 MG Tabs tablet Commonly known as:  ELIQUIS Take 1 tablet (5 mg total) by mouth 2 (two) times daily.   collagenase ointment Commonly known as:  SANTYL Apply 1 application topically daily. Apply to right rear thigh topically every day shift for affwound care apply with calcium alginate   DERMACLOUD Crea Apply 1 application topically 3 (three) times daily.   famotidine 20 MG tablet Commonly known as:  PEPCID Take 2 tablets (40 mg total) by mouth daily.   feeding supplement (PRO-STAT SUGAR FREE 64) Liqd Take 30 mLs by mouth 2 (two) times daily.   ferrous sulfate 325 (65 FE) MG tablet Take 1 tablet (325 mg total) by mouth 2 (two) times daily with a meal.   FLONASE 50 MCG/ACT nasal spray Generic drug:  fluticasone Place 2 sprays into both nostrils daily.   FLOVENT HFA 220 MCG/ACT inhaler Generic drug:  fluticasone INHALE 1 PUFF INTO THE LUNGS TWICE DAILY What changed:  See the new instructions.   furosemide 40 MG tablet Commonly known as:  LASIX Take 1 tablet (40 mg total) by mouth daily.   guaiFENesin 100 MG/5ML liquid Commonly known as:  ROBITUSSIN Take 200 mg by mouth 3 (three) times daily as needed for cough. Take 41ml by mouth every 8 hours for cough/congestion for 7 days   hydrocortisone 5 MG tablet Commonly known as:  CORTEF Take 1 tablet (5 mg total) by mouth daily. Start taking on:  10/18/2018 What changed:    medication strength  how much to take   insulin aspart 100 UNIT/ML injection Commonly  known as:  novoLOG CBG 70 - 120: 0 units CBG 121 - 150: 2 units CBG 151 - 200: 3 units CBG 201 - 250: 5 units CBG 251 - 300: 8 units CBG 301 - 350: 11 units CBG 351 - 400: 15 units   latanoprost 0.005 % ophthalmic solution Commonly known as:  XALATAN INSTILL 1 DROP IN BOTH EYES EVERY EVENING FOR GLAUCOMA What changed:  See the new instructions.   metoprolol tartrate 25 MG tablet Commonly known as:  LOPRESSOR Take 0.5 tablets (12.5 mg total) by mouth 2 (two) times daily.   nystatin powder Commonly known as:  MYCOSTATIN/NYSTOP Apply topically 2 (two) times daily.   pantoprazole 40 MG tablet Commonly known as:  PROTONIX Take 1 tablet (40 mg total) by mouth 2 (two) times daily before a meal.   polyethylene glycol packet Commonly known as:  MIRALAX / GLYCOLAX Take 17 g by mouth daily as needed for mild constipation.   Potassium Chloride ER 20 MEQ Tbcr Take 20 mEq by mouth daily. Use when taking Lasix Start taking on:  10/18/2018   pravastatin 40 MG tablet Commonly known as:  PRAVACHOL TAKE 1 TABLET BY MOUTH DAILY   PROAIR HFA 108 (90 Base) MCG/ACT inhaler Generic drug:  albuterol INHALE 2 PUFFS BY MOUTH EVERY 4 HOURS AS NEEDED FOR WHEEZING OR SHORTNESS OF BREATH What changed:  See the new instructions.   PROBIOTIC PO  Take 2 capsules by mouth daily.   sucralfate 1 g tablet Commonly known as:  CARAFATE Take 1 tablet (1 g total) by mouth 4 (four) times daily -  with meals and at bedtime.   tamsulosin 0.4 MG Caps capsule Commonly known as:  FLOMAX TAKE 2 CAPSULES BY MOUTH EVERY MORNING What changed:  when to take this   urea 10 % lotion Apply topically 2 (two) times daily.       Vitals:   10/17/18 0814 10/17/18 1304  BP:  (!) 123/54  Pulse:  64  Resp:  20  Temp:  98.2 F (36.8 C)  SpO2: 95% 100%    Skin clean, dry and intact without evidence of skin break down, no evidence of skin tears noted. IV catheter discontinued intact. Site without signs and  symptoms of complications. Dressing and pressure applied. Pt denies pain at this time. No complaints noted.  An After Visit Summary was printed and given to the patient. Patient escorted via [place], and D/C home via private auto.  Haywood Lasso BSN, RN [facility] Phone [phone] [name] to be D/C'd Skilled nursing facility per MD order.  Discussed prescriptions and follow up appointments with the patient. Prescriptions given to patient, medication list explained in detail. Pt verbalized understanding.  Allergies as of 10/17/2018      Reactions   Ace Inhibitors Cough   With Lisinopril.   Angiotensin Receptor Blockers Other (See Comments)   Caused excessive weight gain   Metformin Diarrhea      Medication List    TAKE these medications   apixaban 5 MG Tabs tablet Commonly known as:  ELIQUIS Take 1 tablet (5 mg total) by mouth 2 (two) times daily.   collagenase ointment Commonly known as:  SANTYL Apply 1 application topically daily. Apply to right rear thigh topically every day shift for affwound care apply with calcium alginate   DERMACLOUD Crea Apply 1 application topically 3 (three) times daily.   famotidine 20 MG tablet Commonly known as:  PEPCID Take 2 tablets (40 mg total) by mouth daily.   feeding supplement (PRO-STAT SUGAR FREE 64) Liqd Take 30 mLs by mouth 2 (two) times daily.   ferrous sulfate 325 (65 FE) MG tablet Take 1 tablet (325 mg total) by mouth 2 (two) times daily with a meal.   FLONASE 50 MCG/ACT nasal spray Generic drug:  fluticasone Place 2 sprays into both nostrils daily.   FLOVENT HFA 220 MCG/ACT inhaler Generic drug:  fluticasone INHALE 1 PUFF INTO THE LUNGS TWICE DAILY What changed:  See the new instructions.   furosemide 40 MG tablet Commonly known as:  LASIX Take 1 tablet (40 mg total) by mouth daily.   guaiFENesin 100 MG/5ML liquid Commonly known as:  ROBITUSSIN Take 200 mg by mouth 3 (three) times daily as needed for cough. Take 81ml  by mouth every 8 hours for cough/congestion for 7 days   hydrocortisone 5 MG tablet Commonly known as:  CORTEF Take 1 tablet (5 mg total) by mouth daily. Start taking on:  10/18/2018 What changed:    medication strength  how much to take   insulin aspart 100 UNIT/ML injection Commonly known as:  novoLOG CBG 70 - 120: 0 units CBG 121 - 150: 2 units CBG 151 - 200: 3 units CBG 201 - 250: 5 units CBG 251 - 300: 8 units CBG 301 - 350: 11 units CBG 351 - 400: 15 units   latanoprost 0.005 % ophthalmic solution Commonly  known as:  XALATAN INSTILL 1 DROP IN BOTH EYES EVERY EVENING FOR GLAUCOMA What changed:  See the new instructions.   metoprolol tartrate 25 MG tablet Commonly known as:  LOPRESSOR Take 0.5 tablets (12.5 mg total) by mouth 2 (two) times daily.   nystatin powder Commonly known as:  MYCOSTATIN/NYSTOP Apply topically 2 (two) times daily.   pantoprazole 40 MG tablet Commonly known as:  PROTONIX Take 1 tablet (40 mg total) by mouth 2 (two) times daily before a meal.   polyethylene glycol packet Commonly known as:  MIRALAX / GLYCOLAX Take 17 g by mouth daily as needed for mild constipation.   Potassium Chloride ER 20 MEQ Tbcr Take 20 mEq by mouth daily. Use when taking Lasix Start taking on:  10/18/2018   pravastatin 40 MG tablet Commonly known as:  PRAVACHOL TAKE 1 TABLET BY MOUTH DAILY   PROAIR HFA 108 (90 Base) MCG/ACT inhaler Generic drug:  albuterol INHALE 2 PUFFS BY MOUTH EVERY 4 HOURS AS NEEDED FOR WHEEZING OR SHORTNESS OF BREATH What changed:  See the new instructions.   PROBIOTIC PO Take 2 capsules by mouth daily.   sucralfate 1 g tablet Commonly known as:  CARAFATE Take 1 tablet (1 g total) by mouth 4 (four) times daily -  with meals and at bedtime.   tamsulosin 0.4 MG Caps capsule Commonly known as:  FLOMAX TAKE 2 CAPSULES BY MOUTH EVERY MORNING What changed:  when to take this   urea 10 % lotion Apply topically 2 (two) times daily.        Vitals:   10/17/18 0814 10/17/18 1304  BP:  (!) 123/54  Pulse:  64  Resp:  20  Temp:  98.2 F (36.8 C)  SpO2: 95% 100%    Skin clean, dry and intact without evidence of skin break down, no evidence of skin tears noted. IV catheter discontinued intact. Site without signs and symptoms of complications. Dressing and pressure applied. Pt denies pain at this time. No complaints noted.  An After Visit Summary was printed and given to the patient. Patient escorted via [place], and D/C home via private auto.  Haywood Lasso BSN, RN [employer] Phone [phone]

## 2018-10-20 DIAGNOSIS — I5043 Acute on chronic combined systolic (congestive) and diastolic (congestive) heart failure: Secondary | ICD-10-CM | POA: Diagnosis not present

## 2018-10-20 DIAGNOSIS — I2699 Other pulmonary embolism without acute cor pulmonale: Secondary | ICD-10-CM | POA: Diagnosis not present

## 2018-10-20 DIAGNOSIS — J9621 Acute and chronic respiratory failure with hypoxia: Secondary | ICD-10-CM | POA: Diagnosis not present

## 2018-10-20 DIAGNOSIS — I10 Essential (primary) hypertension: Secondary | ICD-10-CM | POA: Diagnosis not present

## 2018-10-21 ENCOUNTER — Other Ambulatory Visit: Payer: Self-pay | Admitting: *Deleted

## 2018-10-21 NOTE — Patient Outreach (Signed)
[facility]) Care Management  10/21/2018  BRYON PARKER [birth year] [id number]   Onsite visit at SNF. Met with Nyoka Cowden. She states that they have rescheduled the care plan meeting for next week as patient had a readmission and just came back to facility. She reports patient wants to return home upon discharge. She will meet with patient and daughter.   Met with patient in his room. Patient sitting in w/c wearing oxygen. Patient reports that he lives alone, he has an aide 3 days a week for 4 hours each day. He states he uses [facility] for transportation. He manages his own medications, through a home delivery pharmacy.   RNCM reviewed [facility] program and that this RNCM would follow up with patient while he was at [facility].   Plan to follow and will monitor for discharge planning needs.  [name]. [name], RN, BSN, [place] [phone]) Business Cell  [phone]) Toll Free Office

## 2018-10-22 ENCOUNTER — Other Ambulatory Visit: Payer: Self-pay | Admitting: *Deleted

## 2018-10-22 DIAGNOSIS — F05 Delirium due to known physiological condition: Secondary | ICD-10-CM | POA: Diagnosis not present

## 2018-10-22 DIAGNOSIS — R609 Edema, unspecified: Secondary | ICD-10-CM | POA: Diagnosis not present

## 2018-10-22 DIAGNOSIS — I5022 Chronic systolic (congestive) heart failure: Secondary | ICD-10-CM | POA: Diagnosis not present

## 2018-10-22 DIAGNOSIS — J9602 Acute respiratory failure with hypercapnia: Secondary | ICD-10-CM | POA: Diagnosis not present

## 2018-10-23 DIAGNOSIS — I5022 Chronic systolic (congestive) heart failure: Secondary | ICD-10-CM | POA: Diagnosis not present

## 2018-10-23 DIAGNOSIS — J9602 Acute respiratory failure with hypercapnia: Secondary | ICD-10-CM | POA: Diagnosis not present

## 2018-10-23 DIAGNOSIS — N390 Urinary tract infection, site not specified: Secondary | ICD-10-CM | POA: Diagnosis not present

## 2018-10-23 DIAGNOSIS — F05 Delirium due to known physiological condition: Secondary | ICD-10-CM | POA: Diagnosis not present

## 2018-10-26 DIAGNOSIS — M6281 Muscle weakness (generalized): Secondary | ICD-10-CM | POA: Diagnosis not present

## 2018-10-26 DIAGNOSIS — N390 Urinary tract infection, site not specified: Secondary | ICD-10-CM | POA: Diagnosis not present

## 2018-10-26 DIAGNOSIS — I5022 Chronic systolic (congestive) heart failure: Secondary | ICD-10-CM | POA: Diagnosis not present

## 2018-10-26 DIAGNOSIS — F05 Delirium due to known physiological condition: Secondary | ICD-10-CM | POA: Diagnosis not present

## 2018-10-29 DIAGNOSIS — L89893 Pressure ulcer of other site, stage 3: Secondary | ICD-10-CM | POA: Diagnosis not present

## 2018-11-02 DIAGNOSIS — R609 Edema, unspecified: Secondary | ICD-10-CM | POA: Diagnosis not present

## 2018-11-02 DIAGNOSIS — R0602 Shortness of breath: Secondary | ICD-10-CM | POA: Diagnosis not present

## 2018-11-02 DIAGNOSIS — I5022 Chronic systolic (congestive) heart failure: Secondary | ICD-10-CM | POA: Diagnosis not present

## 2018-11-02 DIAGNOSIS — R05 Cough: Secondary | ICD-10-CM | POA: Diagnosis not present

## 2018-11-03 DIAGNOSIS — R0602 Shortness of breath: Secondary | ICD-10-CM | POA: Diagnosis not present

## 2018-11-03 DIAGNOSIS — F05 Delirium due to known physiological condition: Secondary | ICD-10-CM | POA: Diagnosis not present

## 2018-11-03 DIAGNOSIS — R609 Edema, unspecified: Secondary | ICD-10-CM | POA: Diagnosis not present

## 2018-11-03 DIAGNOSIS — I5023 Acute on chronic systolic (congestive) heart failure: Secondary | ICD-10-CM | POA: Diagnosis not present

## 2018-11-04 ENCOUNTER — Other Ambulatory Visit: Payer: Self-pay | Admitting: *Deleted

## 2018-11-04 NOTE — Patient Outreach (Signed)
[facility]) Care Management  11/04/2018  NICKEY KLOEPFER Aug 09, 1941 [id number]  Onsite visit to [facility] Met with Marita [place], dcp. She reports patient still wants to go home but is considering as a Plan B for LTC or ALF. No discharge date set  Met with patient, he reports he still wants to go home, he is not sure how much longer he will be at facility.  He does have home care aide 4 hours a day 3 days a week through a grant program.   RNCM reviewed [facility] CM and he agrees to continue services upon discharging home.  Left another packet with patient.  Will put in referral for [facility] CM transition of care upon discharge from facility.  [name]. [name], RN, BSN, [place] [phone]) Business Cell  [phone]) Toll Free Office

## 2018-11-05 DIAGNOSIS — L89153 Pressure ulcer of sacral region, stage 3: Secondary | ICD-10-CM | POA: Diagnosis not present

## 2018-11-09 ENCOUNTER — Institutional Professional Consult (permissible substitution): Payer: PPO | Admitting: Pulmonary Disease

## 2018-11-09 DIAGNOSIS — R4182 Altered mental status, unspecified: Secondary | ICD-10-CM | POA: Diagnosis not present

## 2018-11-09 DIAGNOSIS — R609 Edema, unspecified: Secondary | ICD-10-CM | POA: Diagnosis not present

## 2018-11-09 DIAGNOSIS — R5383 Other fatigue: Secondary | ICD-10-CM | POA: Diagnosis not present

## 2018-11-09 DIAGNOSIS — I5022 Chronic systolic (congestive) heart failure: Secondary | ICD-10-CM | POA: Diagnosis not present

## 2018-11-10 DIAGNOSIS — I5023 Acute on chronic systolic (congestive) heart failure: Secondary | ICD-10-CM | POA: Diagnosis not present

## 2018-11-10 DIAGNOSIS — F05 Delirium due to known physiological condition: Secondary | ICD-10-CM | POA: Diagnosis not present

## 2018-11-10 DIAGNOSIS — R609 Edema, unspecified: Secondary | ICD-10-CM | POA: Diagnosis not present

## 2018-11-10 DIAGNOSIS — R0602 Shortness of breath: Secondary | ICD-10-CM | POA: Diagnosis not present

## 2018-11-11 ENCOUNTER — Other Ambulatory Visit: Payer: Self-pay | Admitting: *Deleted

## 2018-11-11 DIAGNOSIS — E1165 Type 2 diabetes mellitus with hyperglycemia: Secondary | ICD-10-CM | POA: Diagnosis not present

## 2018-11-11 DIAGNOSIS — R451 Restlessness and agitation: Secondary | ICD-10-CM | POA: Diagnosis not present

## 2018-11-11 DIAGNOSIS — I5023 Acute on chronic systolic (congestive) heart failure: Secondary | ICD-10-CM | POA: Diagnosis not present

## 2018-11-11 DIAGNOSIS — D649 Anemia, unspecified: Secondary | ICD-10-CM | POA: Diagnosis not present
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
## 2018-11-12 DIAGNOSIS — L89893 Pressure ulcer of other site, stage 3: Secondary | ICD-10-CM | POA: Diagnosis not present

## 2018-11-12 DIAGNOSIS — L89153 Pressure ulcer of sacral region, stage 3: Secondary | ICD-10-CM | POA: Diagnosis not present

## 2018-11-16 ENCOUNTER — Institutional Professional Consult (permissible substitution): Payer: PPO | Admitting: Pulmonary Disease

## 2018-11-19 ENCOUNTER — Encounter: Payer: Self-pay | Admitting: Pulmonary Disease

## 2018-11-19 ENCOUNTER — Ambulatory Visit (INDEPENDENT_AMBULATORY_CARE_PROVIDER_SITE_OTHER): Payer: PPO | Admitting: Pulmonary Disease

## 2018-11-19 VITALS — BP 140/86 | HR 86

## 2018-11-19 DIAGNOSIS — M358 Other specified systemic involvement of connective tissue: Secondary | ICD-10-CM | POA: Diagnosis not present

## 2018-11-19 DIAGNOSIS — G4733 Obstructive sleep apnea (adult) (pediatric): Secondary | ICD-10-CM | POA: Diagnosis not present

## 2018-11-19 DIAGNOSIS — M351 Other overlap syndromes: Secondary | ICD-10-CM

## 2018-11-19 NOTE — Patient Instructions (Signed)
Patient with possible obesity hypoventilation syndrome Morbid obesity, obstructive lung disease Recurrent hospitalizations for hypercapnic respiratory failure  He was discharged to facility to use a CPAP at night  Encourage BIPAP use at night Encourage oxygen use around-the-clock  Try motivate patient to stay awake during the day to try to consolidate sleep at nighttime  Work on compliance with above   I will see you back in the office in about 2 months  We will try in order an in lab sleep study If this cannot be achieved but patient is willing to be compliant with use of a device, nocturnal ventilator -noninvasive , will have to be considered.

## 2018-11-19 NOTE — Progress Notes (Signed)
[name]    [id number]    Nov 26, 1941  Primary Care Physician:McMullen, Grayling Congress, DO  Referring Physician: Ranshaw Bing, DO [address] [place], [place] [address]  Chief complaint:   Patient being seen for evaluation for obstructive sleep apnea  HPI:  He was recently hospitalized with acute on chronic respiratory failure, decompensated diastolic heart failure He was discharged on CPAP for concern about obstructive sleep apnea There is a question of compliance with CPAP use-patient was asking questions about if there is any way of using a device that does not cover his face  Did speak with the nurse at the nursing facility, Patient known to refuse oxygen supplementation and CPAP use He had declined to use oxygen at least on 3 occasions prior to coming over to the office  Recent pulmonary embolism, morbid obesity, diabetes, multiple comorbidities  Outpatient Encounter Medications as of 11/19/2018  Medication Sig  . Amino Acids-Protein Hydrolys (FEEDING SUPPLEMENT, PRO-STAT SUGAR FREE 64,) LIQD Take 30 mLs by mouth 2 (two) times daily.  Marland Kitchen apixaban (ELIQUIS) 5 MG TABS tablet Take 1 tablet (5 mg total) by mouth 2 (two) times daily.  . collagenase (SANTYL) ointment Apply 1 application topically daily. Apply to right rear thigh topically every day shift for affwound care apply with calcium alginate  . famotidine (PEPCID) 20 MG tablet Take 2 tablets (40 mg total) by mouth daily.  . ferrous sulfate 325 (65 FE) MG tablet Take 1 tablet (325 mg total) by mouth 2 (two) times daily with a meal.  . FLOVENT HFA 220 MCG/ACT inhaler INHALE 1 PUFF INTO THE LUNGS TWICE DAILY (Patient taking differently: Inhale 1 puff into the lungs 2 (two) times daily. )  . fluticasone (FLONASE) 50 MCG/ACT nasal spray Place 2 sprays into both nostrils daily.  . furosemide (LASIX) 40 MG tablet Take 1 tablet (40 mg total) by mouth daily.  Marland Kitchen guaiFENesin (ROBITUSSIN) 100 MG/5ML liquid Take 200 mg by  mouth 3 (three) times daily as needed for cough. Take 35ml by mouth every 8 hours for cough/congestion for 7 days  . hydrocortisone (CORTEF) 5 MG tablet Take 1 tablet (5 mg total) by mouth daily.  . Infant Care Products (DERMACLOUD) CREA Apply 1 application topically 3 (three) times daily.  . insulin aspart (NOVOLOG) 100 UNIT/ML injection CBG 70 - 120: 0 units CBG 121 - 150: 2 units CBG 151 - 200: 3 units CBG 201 - 250: 5 units CBG 251 - 300: 8 units CBG 301 - 350: 11 units CBG 351 - 400: 15 units  . latanoprost (XALATAN) 0.005 % ophthalmic solution INSTILL 1 DROP IN BOTH EYES EVERY EVENING FOR GLAUCOMA (Patient taking differently: Place 1 drop into both eyes at bedtime. )  . metoprolol tartrate (LOPRESSOR) 25 MG tablet Take 0.5 tablets (12.5 mg total) by mouth 2 (two) times daily.  Marland Kitchen nystatin (MYCOSTATIN/NYSTOP) powder Apply topically 2 (two) times daily.  . pantoprazole (PROTONIX) 40 MG tablet Take 1 tablet (40 mg total) by mouth 2 (two) times daily before a meal.  . polyethylene glycol (MIRALAX / GLYCOLAX) packet Take 17 g by mouth daily as needed for mild constipation.  . potassium chloride 20 MEQ TBCR Take 20 mEq by mouth daily. Use when taking Lasix  . pravastatin (PRAVACHOL) 40 MG tablet TAKE 1 TABLET BY MOUTH DAILY (Patient taking differently: Take 40 mg by mouth daily. )  . PROAIR HFA 108 (90 Base) MCG/ACT inhaler INHALE 2 PUFFS  BY MOUTH EVERY 4 HOURS AS NEEDED FOR WHEEZING OR SHORTNESS OF BREATH (Patient taking differently: Inhale 2 puffs into the lungs every 4 (four) hours as needed for wheezing or shortness of breath. )  . Probiotic Product (PROBIOTIC PO) Take 2 capsules by mouth daily.  . sucralfate (CARAFATE) 1 g tablet Take 1 tablet (1 g total) by mouth 4 (four) times daily -  with meals and at bedtime.  . tamsulosin (FLOMAX) 0.4 MG CAPS capsule TAKE 2 CAPSULES BY MOUTH EVERY MORNING (Patient taking differently: Take 0.8 mg by mouth daily. )  . urea 10 % lotion Apply topically 2  (two) times daily.   No facility-administered encounter medications on file as of 11/19/2018.     Allergies as of 11/19/2018 - Review Complete 11/19/2018  Allergen Reaction Noted  . Ace inhibitors Cough 03/05/2007  . Angiotensin receptor blockers Other (See Comments) 03/05/2007  . Metformin Diarrhea 03/05/2007    Past Medical History:  Diagnosis Date  . Chronic osteomyelitis of lower leg (HCC) 11/20/2010   Chronic osteoarthritis of right lower leg-10/03/10 note by Dr. Harvie Heck scanned of this visit under media tab. Dr. Sharol Given states that this is chronic osteomyelitis, only cure would be amputation. Patient was not agreeable to this at his visit with Dr. Sharol Given. Patient elected to apply Bactroban cream, Dr. Sharol Given and time of that visit also prescribe doxycycline. Dr. Sharol Given stay the patient she come to the [facility] 02/26/2007   Qualifier: Diagnosis of  By: Erling Cruz  MD, [place]    . Complication of anesthesia   . Diabetes mellitus   . Diabetic peripheral neuropathy associated with type 2 diabetes mellitus ([place]) 02/07/2014  . GERD (gastroesophageal reflux disease)   . Grade II diastolic dysfunction   . Heart murmur    last 2D Echo -03/31/08  . HIATAL HERNIA WITH REFLUX 09/29/2006   Qualifier: Diagnosis of  By: Erling Cruz  MD, MELISSA    . HIP REPLACEMENT, BILATERAL, HX OF 05/12/2007   Qualifier: Diagnosis of  By: Erling Cruz  MD, MELISSA    . History of blood transfusion   . Hypertension   . OSTEOARTHRITIS 03/05/2007   Qualifier: Diagnosis of  By: Erling Cruz  MD, MELISSA    . Paget's disease of bony pelvis   . PONV (postoperative nausea and vomiting)   . Prostate cancer ([place])   . SBO (small bowel obstruction) ([place]) 02/2017  . Seasonal allergies   . VENOUS INSUFFICIENCY, CHRONIC 02/26/2007   Qualifier: Diagnosis of  By: Erling Cruz  MD, MELISSA      Past Surgical History:  Procedure Laterality Date  . COLONOSCOPY    . COLONOSCOPY    . ESOPHAGOGASTRODUODENOSCOPY (EGD) WITH PROPOFOL N/A 08/27/2018   Procedure:  ESOPHAGOGASTRODUODENOSCOPY (EGD) WITH PROPOFOL HEMOSPRAY ;  Surgeon: Otis Brace, MD;  Location: [facility];  Service: Gastroenterology;  Laterality: N/A;  bedside EGD  . ESOPHAGOGASTRODUODENOSCOPY (EGD) WITH PROPOFOL N/A 09/08/2018   Procedure: ESOPHAGOGASTRODUODENOSCOPY (EGD) WITH PROPOFOL;  Surgeon: Clarene Essex, MD;  Location: [facility];  Service: Endoscopy;  Laterality: N/A;  . HIP ARTHROPLASTY     bil  . HOT HEMOSTASIS N/A 08/27/2018   Procedure: HOT HEMOSTASIS (ARGON PLASMA COAGULATION/BICAP);  Surgeon: Otis Brace, MD;  Location[facility];  Service: Gastroenterology;  Laterality: N/A;  bicap  . I&D EXTREMITY  08/14/2012   Procedure: IRRIGATION AND DEBRIDEMENT EXTREMITY;  Surgeon: Newt Minion, MD;  Location: [facility];  Service: Orthopedics;  Laterality: Right;  Irrigation and Debridement Right tibia, Placement antibiotic beads  .  I&D EXTREMITY Left 03/18/2014   Procedure: IRRIGATION AND DEBRIDEMENT EXTREMITY;  Surgeon: Newt Minion, MD;  Location: [facility];  Service: Orthopedics;  Laterality: Left;  Debridement Left Calf Ulcer, Apply Theraskin and Wound VAC  . IR ANGIOGRAM SELECTIVE EACH ADDITIONAL VESSEL  09/05/2018  . IR ANGIOGRAM SELECTIVE EACH ADDITIONAL VESSEL  09/05/2018  . IR ANGIOGRAM SELECTIVE EACH ADDITIONAL VESSEL  09/05/2018  . IR ANGIOGRAM VISCERAL SELECTIVE  09/05/2018  . IR ANGIOGRAM VISCERAL SELECTIVE  09/05/2018  . IR EMBO ART  VEN HEMORR LYMPH EXTRAV  INC GUIDE ROADMAPPING  09/05/2018  . IR US GUIDE VASC ACCESS RIGHT  09/05/2018  . IRRIGATION AND DEBRIDEMENT ABSCESS Left 03/18/2014   DR DUDA  . LEG SURGERY     ROD for fracture  . LEG WOUND REPAIR / CLOSURE     Beads .  Skin wound  . SKIN GRAFT     right leg- from donor skin  . SKIN GRAFT  1976   R wrist   skin graft from left thigh  . SUBMUCOSAL INJECTION  08/27/2018   Procedure: SUBMUCOSAL INJECTION;  Surgeon: Otis Brace, MD;  Location: [facility];  Service: Gastroenterology;;  . UPPER GASTROINTESTINAL  ENDOSCOPY    . WISDOM TOOTH EXTRACTION    . WRIST FUSION     right wrist    Family History  Problem Relation Age of Onset  . Hypertension Mother     Social History   Socioeconomic History  . Marital status: Widowed    Spouse name: Not on file  . Number of children: 4  . Years of education: 33  . Highest education level: Not on file  Occupational History  . Occupation: retired- Administrator  Social Needs  . Financial resource strain: Not hard at all  . Food insecurity:    Worry: Never true    Inability: Never true  . Transportation needs:    Medical: No    Non-medical: No  Tobacco Use  . Smoking status: Never Smoker  . Smokeless tobacco: Never Used  Substance and Sexual Activity  . Alcohol use: No  . Drug use: No  . Sexual activity: Not Currently  Lifestyle  . Physical activity:    Days per week: 0 days    Minutes per session: 0 min  . Stress: Not at all  Relationships  . Social connections:    Talks on phone: More than three times a week    Gets together: More than three times a week    Attends religious service: Never    Active member of club or organization: No    Attends meetings of clubs or organizations: Not on file    Relationship status: Widowed  . Intimate partner violence:    Fear of current or ex partner: Not on file    Emotionally abused: Not on file    Physically abused: Not on file    Forced sexual activity: Not on file  Other Topics Concern  . Not on file  Social History Narrative   Health Care POA:    Emergency Contact: daughter, Cheri Rous [phone]   End of Life Plan: gave pt AD pamphlet   Who lives with you: self   Any pets: none   Diet: pt has a varied diet of protein, starch and vegetables.    Exercise: Pt does not have regular exercise routine.  We discussed starting a regular chair exercise program.   Seatbelts: Pt reports wearing seatbelt when in vehicles.  Hobbies: watching car races          Review of Systems    Constitutional: Positive for fatigue.  HENT: Negative.   Respiratory: Positive for shortness of breath.   Cardiovascular: Positive for leg swelling.  Gastrointestinal: Negative.   Skin: Negative.   Neurological: Negative.   Psychiatric/Behavioral: Positive for sleep disturbance.  All other systems reviewed and are negative.   Vitals:   11/19/18 1133  BP: 140/86  Pulse: 86  SpO2: 96%     Physical Exam  Constitutional: He is oriented to person, place, and time. He appears well-developed.  Obese  HENT:  Head: Normocephalic and atraumatic.  Eyes: Pupils are equal, round, and reactive to light. Conjunctivae and EOM are normal. Right eye exhibits no discharge. Left eye exhibits no discharge.  Neck: Normal range of motion. Neck supple. No tracheal deviation present. No thyromegaly present.  Cardiovascular: Normal rate and regular rhythm. Exam reveals no friction rub.  No murmur heard. Pulmonary/Chest: Effort normal and breath sounds normal. No respiratory distress. He has no wheezes.  Abdominal: Soft. Bowel sounds are normal. He exhibits no distension. There is no tenderness. There is no rebound.  Musculoskeletal: He exhibits edema.  Neurological: He is alert and oriented to person, place, and time. No cranial nerve deficit.  Skin: Skin is warm and dry. No erythema.  Psychiatric: He has a normal mood and affect.   Data Reviewed: Recent hospital records reviewed  Recent ABG in the hospital did reveal hypercapnic respiratory failure, chronic CO2 retainer, metabolic alkalosis  Assessment:  Patient with morbid obesity Recurrent hospitalizations for hypercapnic respiratory failure Decompensated congestive heart failure  He was discharged with a CPAP with concern for obstructive sleep apnea  Question of compliance with CPAP use  Plan/Recommendations:  We did talk about the need to comply with CPAP use He will require a sleep study to rule out significant sleep disordered  breathing-a split-night study will be ordered If this is difficult to obtain then empirically have any more than 15/8 with a follow-up ABG, follow-up electrolytes and monitor bicarb levels  And noninvasive nocturnal ventilator may be considered as an option of treatment  Patient will likely not be compliant with any mask device and if so, oxygen supplementation by itself if he is compliant with using oxygen may be the only option we have  Scheduled to come back in 2 months  Sherrilyn Rist MD [facility] 11/19/2018, 12:02 PM  CC:  Bing, DO

## 2018-11-24 ENCOUNTER — Other Ambulatory Visit: Payer: Self-pay | Admitting: *Deleted

## 2018-11-24 NOTE — Patient Outreach (Signed)
[facility]) Care Management  11/24/2018  [name] 02-19-1941 [id number]   Onsite visit to [employer].  Marita Kansas, discharge planner at facility, she reports that patient will exhaust mid December.  She states his confusion has cleared up some, she spoke with him about discharge plans and reports he has not made a decision around his discharge disposition as far as home or ALF or LTC.  Spoke with patient briefly. He still has not decision at this time.   Will continue to monitor and collaborate with [facility] team.  [name]. [name], RN, BSN, [place] [phone]) Business Cell  [phone]) Toll Free Office

## 2018-12-02 ENCOUNTER — Other Ambulatory Visit: Payer: Self-pay | Admitting: *Deleted

## 2018-12-02 DIAGNOSIS — F05 Delirium due to known physiological condition: Secondary | ICD-10-CM | POA: Diagnosis not present

## 2018-12-02 DIAGNOSIS — M6281 Muscle weakness (generalized): Secondary | ICD-10-CM | POA: Diagnosis not present

## 2018-12-02 DIAGNOSIS — W19XXXA Unspecified fall, initial encounter: Secondary | ICD-10-CM | POA: Diagnosis not present

## 2018-12-02 NOTE — Patient Outreach (Signed)
[facility]) Care Management  12/02/2018  [name] 02-21-1941 [id number]   Onsite visit to facility. Per [name], patient will exhaust 12/15 and he plans to go home.  She voices concern as he really needs a higher level of care, but is making his own decisions.   Met with patient in his room, he states he plans to go home, he does have a free program where he gets 12 hours of CNA services weekly, he lives alone and will need more support.  Discussed with patient about [facility] care management services, he states he feels he has spoken on the phone with someone from [facility] in the past. He agrees to LCSW referral to discuss any community resources available to help support his discharge.   Plan to refer to [facility] LCSW to assess for care providers program and need for RNCM transition of care.  This RNCM let Marita Kansas know of referral and to expect [facility] LCSW to visit facility and patient in the next week or so.  [name]. [name], RN, BSN, [place] [phone]) Business Cell  [phone]) Toll Free Office

## 2018-12-03 DIAGNOSIS — M6281 Muscle weakness (generalized): Secondary | ICD-10-CM | POA: Diagnosis not present

## 2018-12-03 DIAGNOSIS — R296 Repeated falls: Secondary | ICD-10-CM | POA: Diagnosis not present

## 2018-12-03 DIAGNOSIS — L89893 Pressure ulcer of other site, stage 3: Secondary | ICD-10-CM | POA: Diagnosis not present

## 2018-12-03 DIAGNOSIS — W19XXXA Unspecified fall, initial encounter: Secondary | ICD-10-CM | POA: Diagnosis not present

## 2018-12-03 DIAGNOSIS — E559 Vitamin D deficiency, unspecified: Secondary | ICD-10-CM | POA: Diagnosis not present

## 2018-12-04 DIAGNOSIS — F4322 Adjustment disorder with anxiety: Secondary | ICD-10-CM | POA: Diagnosis not present

## 2018-12-08 ENCOUNTER — Other Ambulatory Visit: Payer: Self-pay | Admitting: Licensed Clinical Social Worker

## 2018-12-08 NOTE — Patient Outreach (Signed)
[facility]) Care Management  [facility] Social Work  12/08/2018  AREON COCUZZA [birth year] [id number]  Encounter Medications:  Outpatient Encounter Medications as of 12/08/2018  Medication Sig Note  . Amino Acids-Protein Hydrolys (FEEDING SUPPLEMENT, PRO-STAT SUGAR FREE 64,) LIQD Take 30 mLs by mouth 2 (two) times daily.   Marland Kitchen apixaban (ELIQUIS) 5 MG TABS tablet Take 1 tablet (5 mg total) by mouth 2 (two) times daily. 10/14/2018: Pt takes at 08:00 and 16:00  . collagenase (SANTYL) ointment Apply 1 application topically daily. Apply to right rear thigh topically every day shift for affwound care apply with calcium alginate   . famotidine (PEPCID) 20 MG tablet Take 2 tablets (40 mg total) by mouth daily.   . ferrous sulfate 325 (65 FE) MG tablet Take 1 tablet (325 mg total) by mouth 2 (two) times daily with a meal.   . FLOVENT HFA 220 MCG/ACT inhaler INHALE 1 PUFF INTO THE LUNGS TWICE DAILY (Patient taking differently: Inhale 1 puff into the lungs 2 (two) times daily. ) 07/28/2018: LF 07/01/18 DS 60  . fluticasone (FLONASE) 50 MCG/ACT nasal spray Place 2 sprays into both nostrils daily.   . furosemide (LASIX) 40 MG tablet Take 1 tablet (40 mg total) by mouth daily.   Marland Kitchen guaiFENesin (ROBITUSSIN) 100 MG/5ML liquid Take 200 mg by mouth 3 (three) times daily as needed for cough. Take 28m by mouth every 8 hours for cough/congestion for 7 days   . hydrocortisone (CORTEF) 5 MG tablet Take 1 tablet (5 mg total) by mouth daily.   . Infant Care Products (DERMACLOUD) CREA Apply 1 application topically 3 (three) times daily.   . insulin aspart (NOVOLOG) 100 UNIT/ML injection CBG 70 - 120: 0 units CBG 121 - 150: 2 units CBG 151 - 200: 3 units CBG 201 - 250: 5 units CBG 251 - 300: 8 units CBG 301 - 350: 11 units CBG 351 - 400: 15 units   . latanoprost (XALATAN) 0.005 % ophthalmic solution INSTILL 1 DROP IN BOTH EYES EVERY EVENING FOR GLAUCOMA (Patient taking differently: Place 1 drop into both  eyes at bedtime. ) 07/28/2018: LF 07/01/18 DS 30  . metoprolol tartrate (LOPRESSOR) 25 MG tablet Take 0.5 tablets (12.5 mg total) by mouth 2 (two) times daily. 10/14/2018: Pt takes at 0900 and 17:00  . nystatin (MYCOSTATIN/NYSTOP) powder Apply topically 2 (two) times daily. 07/28/2018: LF 07/01/18 DS 30  . pantoprazole (PROTONIX) 40 MG tablet Take 1 tablet (40 mg total) by mouth 2 (two) times daily before a meal.   . polyethylene glycol (MIRALAX / GLYCOLAX) packet Take 17 g by mouth daily as needed for mild constipation.   . potassium chloride 20 MEQ TBCR Take 20 mEq by mouth daily. Use when taking Lasix   . pravastatin (PRAVACHOL) 40 MG tablet TAKE 1 TABLET BY MOUTH DAILY (Patient taking differently: Take 40 mg by mouth daily. )   . PROAIR HFA 108 (90 Base) MCG/ACT inhaler INHALE 2 PUFFS BY MOUTH EVERY 4 HOURS AS NEEDED FOR WHEEZING OR SHORTNESS OF BREATH (Patient taking differently: Inhale 2 puffs into the lungs every 4 (four) hours as needed for wheezing or shortness of breath. ) 07/28/2018: LF 07/01/18   . Probiotic Product (PROBIOTIC PO) Take 2 capsules by mouth daily.   . sucralfate (CARAFATE) 1 g tablet Take 1 tablet (1 g total) by mouth 4 (four) times daily -  with meals and at bedtime.   . tamsulosin (FLOMAX) 0.4 MG CAPS capsule TAKE 2 CAPSULES  BY MOUTH EVERY MORNING (Patient taking differently: Take 0.8 mg by mouth daily. ) 07/28/2018: LF 07/01/18 DS 30  . urea 10 % lotion Apply topically 2 (two) times daily.    No facility-administered encounter medications on file as of 12/08/2018.     Functional Status:  In your present state of health, do you have any difficulty performing the following activities: 10/14/2018 08/19/2018  Hearing? N N  Vision? Y Y  Difficulty concentrating or making decisions? N N  Walking or climbing stairs? Y Y  Dressing or bathing? Y Y  Doing errands, shopping? Y Y  Comment - -  Some recent data might be hidden    Fall/Depression Screening:  PHQ 2/9 Scores 05/08/2017  03/20/2017 11/25/2016 11/08/2016 11/08/2016 10/25/2016 04/11/2016  PHQ - 2 Score 0 0 0 0 0 0 0    Assessment: [facility] CSW arrived at [facility] after receiving referral and request to follow patient during his SNF stay on 12/03/18. [facility] CSW spoke with [place] and was informed that facility is very concerned about patient's ability to care for himself but patient is adamant that he does not want LTC placement and patient would benefit from [facility] services after discharge. [facility] CSW met with patient and received HIPPA verifications successfully. [facility] CSW introduced self, reason for visit and of [facility] CM Services.Patient agreeable to services and has an active consent on file. Patient shares signs of confusion throughout session. [facility] nurse states that they believe patient has an UTI which is the result of his confusion. [facility] CSW discussed upcoming discharge date and plan. Patient admits that he refused to apply for Medicaid as facility was agreeable to apply for him. Patient reports that he wishes to return home and feels confident that he can take appropriate care of himself. Patient's scheduled discharge date is set for 12/14/18. Patient reports that he lives alone and has an aide 3 days a week for 3-4 hours per day through [employer]. Patient reports that he has [employer] for transportation and Meals of Wheels. Patient reports that he manages his own medications through a home delivery program. Patient reports that he feels he would benefit from additional hours of care-giving post SNF discharge. [facility] CSW provided education on [facility] and patient is agreeable to services. [facility] CSW completed financial assessment and deemed patient eligible for the [facility] financial assistance program. [facility] CSW met with SNF social worker and was informed that she feels that patient is in need of a higher level of care but that he is refuses to consider this. SNF social worker agreeable to  coordinate care with [facility] post SNF discharge. [facility] CSW will send request for Home Care Providers to assistant clinical director, Bary Castilla and await for approval.  Plan: [facility] CSW will await to her back from supervisor and follow up as needed.   Eula Fried, BSW, MSW, [email or web address] Phone: [phone] Fax: [phone]

## 2018-12-10 ENCOUNTER — Other Ambulatory Visit: Payer: Self-pay | Admitting: Licensed Clinical Social Worker

## 2018-12-10 DIAGNOSIS — R5383 Other fatigue: Secondary | ICD-10-CM | POA: Diagnosis not present

## 2018-12-10 DIAGNOSIS — M6281 Muscle weakness (generalized): Secondary | ICD-10-CM | POA: Diagnosis not present

## 2018-12-10 DIAGNOSIS — I5023 Acute on chronic systolic (congestive) heart failure: Secondary | ICD-10-CM | POA: Diagnosis not present

## 2018-12-10 DIAGNOSIS — L89153 Pressure ulcer of sacral region, stage 3: Secondary | ICD-10-CM | POA: Diagnosis not present

## 2018-12-10 DIAGNOSIS — E860 Dehydration: Secondary | ICD-10-CM | POA: Diagnosis not present

## 2018-12-10 NOTE — Patient Outreach (Signed)
[facility]) Care Management  12/10/2018  [name] [birth year] [id number]  CSW completed outreach attempt today to patient's daughter Ivin Booty to discuss patient's upcoming SNF discharge. CSW unable to reach patient's daughter successfully. CSW left a HIPPA compliant voice message encouraging her to return call once available. [facility] CSW will await for return call or complete additional call if needed. [facility] CSW received approval for [facility] from [place] Director on 12/09/18. [facility] CSW sent secure email to Hassell Done with [facility] with information.   Eula Fried, BSW, MSW, [email or web address] Phone: [phone] Fax: [phone]

## 2018-12-11 ENCOUNTER — Other Ambulatory Visit: Payer: PPO | Admitting: Licensed Clinical Social Worker

## 2018-12-11 DIAGNOSIS — M6281 Muscle weakness (generalized): Secondary | ICD-10-CM | POA: Diagnosis not present

## 2018-12-11 DIAGNOSIS — I5023 Acute on chronic systolic (congestive) heart failure: Secondary | ICD-10-CM | POA: Diagnosis not present

## 2018-12-11 DIAGNOSIS — E1151 Type 2 diabetes mellitus with diabetic peripheral angiopathy without gangrene: Secondary | ICD-10-CM | POA: Diagnosis not present

## 2018-12-11 NOTE — Patient Outreach (Signed)
[facility] [place] [facility]) Care Management  [facility] Social Work  12/11/2018  [name] 27-May-1941 [id number]   Encounter Medications:  Outpatient Encounter Medications as of 12/11/2018  Medication Sig Note  . Amino Acids-Protein Hydrolys (FEEDING SUPPLEMENT, PRO-STAT SUGAR FREE 64,) LIQD Take 30 mLs by mouth 2 (two) times daily.   Marland Kitchen apixaban (ELIQUIS) 5 MG TABS tablet Take 1 tablet (5 mg total) by mouth 2 (two) times daily. 10/14/2018: Pt takes at 08:00 and 16:00  . collagenase (SANTYL) ointment Apply 1 application topically daily. Apply to right rear thigh topically every day shift for affwound care apply with calcium alginate   . famotidine (PEPCID) 20 MG tablet Take 2 tablets (40 mg total) by mouth daily.   . ferrous sulfate 325 (65 FE) MG tablet Take 1 tablet (325 mg total) by mouth 2 (two) times daily with a meal.   . FLOVENT HFA 220 MCG/ACT inhaler INHALE 1 PUFF INTO THE LUNGS TWICE DAILY (Patient taking differently: Inhale 1 puff into the lungs 2 (two) times daily. ) 07/28/2018: LF 07/01/18 DS 60  . fluticasone (FLONASE) 50 MCG/ACT nasal spray Place 2 sprays into both nostrils daily.   . furosemide (LASIX) 40 MG tablet Take 1 tablet (40 mg total) by mouth daily.   Marland Kitchen guaiFENesin (ROBITUSSIN) 100 MG/5ML liquid Take 200 mg by mouth 3 (three) times daily as needed for cough. Take 52m by mouth every 8 hours for cough/congestion for 7 days   . hydrocortisone (CORTEF) 5 MG tablet Take 1 tablet (5 mg total) by mouth daily.   . Infant Care Products (DERMACLOUD) CREA Apply 1 application topically 3 (three) times daily.   . insulin aspart (NOVOLOG) 100 UNIT/ML injection CBG 70 - 120: 0 units CBG 121 - 150: 2 units CBG 151 - 200: 3 units CBG 201 - 250: 5 units CBG 251 - 300: 8 units CBG 301 - 350: 11 units CBG 351 - 400: 15 units   . latanoprost (XALATAN) 0.005 % ophthalmic solution INSTILL 1 DROP IN BOTH EYES EVERY EVENING FOR GLAUCOMA (Patient taking differently: Place 1 drop into  both eyes at bedtime. ) 07/28/2018: LF 07/01/18 DS 30  . metoprolol tartrate (LOPRESSOR) 25 MG tablet Take 0.5 tablets (12.5 mg total) by mouth 2 (two) times daily. 10/14/2018: Pt takes at 0900 and 17:00  . nystatin (MYCOSTATIN/NYSTOP) powder Apply topically 2 (two) times daily. 07/28/2018: LF 07/01/18 DS 30  . pantoprazole (PROTONIX) 40 MG tablet Take 1 tablet (40 mg total) by mouth 2 (two) times daily before a meal.   . polyethylene glycol (MIRALAX / GLYCOLAX) packet Take 17 g by mouth daily as needed for mild constipation.   . potassium chloride 20 MEQ TBCR Take 20 mEq by mouth daily. Use when taking Lasix   . pravastatin (PRAVACHOL) 40 MG tablet TAKE 1 TABLET BY MOUTH DAILY (Patient taking differently: Take 40 mg by mouth daily. )   . PROAIR HFA 108 (90 Base) MCG/ACT inhaler INHALE 2 PUFFS BY MOUTH EVERY 4 HOURS AS NEEDED FOR WHEEZING OR SHORTNESS OF BREATH (Patient taking differently: Inhale 2 puffs into the lungs every 4 (four) hours as needed for wheezing or shortness of breath. ) 07/28/2018: LF 07/01/18   . Probiotic Product (PROBIOTIC PO) Take 2 capsules by mouth daily.   . sucralfate (CARAFATE) 1 g tablet Take 1 tablet (1 g total) by mouth 4 (four) times daily -  with meals and at bedtime.   . tamsulosin (FLOMAX) 0.4 MG CAPS capsule TAKE  2 CAPSULES BY MOUTH EVERY MORNING (Patient taking differently: Take 0.8 mg by mouth daily. ) 07/28/2018: LF 07/01/18 DS 30  . urea 10 % lotion Apply topically 2 (two) times daily.    No facility-administered encounter medications on file as of 12/11/2018.     Functional Status:  In your present state of health, do you have any difficulty performing the following activities: 10/14/2018 08/19/2018  Hearing? N N  Vision? Y Y  Difficulty concentrating or making decisions? N N  Walking or climbing stairs? Y Y  Dressing or bathing? Y Y  Doing errands, shopping? Y Y  Comment - -  Some recent data might be hidden    Fall/Depression Screening:  PHQ 2/9 Scores  05/08/2017 03/20/2017 11/25/2016 11/08/2016 11/08/2016 10/25/2016 04/11/2016  PHQ - 2 Score 0 0 0 0 0 0 0    Assessment: [facility] CSW arrived at [facility] to complete final [facility] Consult before patient's expected SNF discharge on 12/14/18. Patient provided HIPPA verifications successfully. Patient's confusion seems better than it did during [facility] CSW's last PAC Consult. [facility] CSW informed him that he met with Greta Doom from [facility] this week and mentioned possibly being interested in LTC placement. Patient reports that he is 100 % percent positive that he does not want LTC placement at this time even though staff have expressed safety concerns in regards to patient returning home. Patient reports that he is agreeable to [place] and [facility] RNCM involvement post SNF discharge to follow up on case management needs as well as safety concerns with transitioning back home from SNF. [facility] CSW will place referrals on 12/14/18. Patient reports again that he is unable to live with his daughter. He states this is because she has too many steps and also she works full-time and does not want to be a burden on his family. Patient reports that he has stable transportation through [place] and will continue to receive frozen meals through the [employer] program. [facility] CSW completed call to [employer] and was informed that patient changed from hot meals to frozen meals back in 2014. Patient's daughter picks up these frozen meal boxes from [employer] monthly. [facility] CSW met with SNF social worker and was informed that she found out recently that patient has multiple children per daughter but not all live in town. SNF social worker agreeable to fax required patient information to [facility] with [facility]. Services will start post SNF discharge. [facility] CSW completed second outreach attempt to daughter and was able to reach her successfully. HIPPA verifications received. [facility] CSW introduced self, reason for call and of [facility]  CM Services. Daughter reports that she is currently on the way to DSS to meet with a long term care Medicaid caseworker and APS. Patient reports that she has been trying for years to get patient to agree to her being his HCPOA but he has refused just as he is refusing LTC placement now. Daughter reports that she wants patient to reside at [facility] long term as patient does not have enough support in the home to appropriately take care of himself. Daughter reports that he is unable to reside with her because she has steps. Daughter reports that she will to go to court and petition for guardianship if she has to in order to get patient placed. [facility] CSW provided education on the entire LTC placement process with Medicaid. Family appreciative of phone call, education and support provided during  phone call. Daughter is agreeable to contact SNF social worker Marita [place] to inform her that she wishes to follow through with LTC placement now. [facility] CSW sent secure email to Hassell Done with [facility] and [facility] social worker [place] with this update.   Plan: [facility] CSW will follow up on 12/14/18 and make appropriate referrals as needed if SNF discharge takes place as scheduled.   Eula Fried, BSW, MSW, [email or web address] Phone: [phone] Fax: [phone]

## 2018-12-14 ENCOUNTER — Other Ambulatory Visit: Payer: Self-pay | Admitting: Licensed Clinical Social Worker

## 2018-12-14 NOTE — Patient Outreach (Signed)
[facility] [facility]) Care Management  12/14/2018  [name] 23-Oct-1941 [id number]  [facility] CSW sent secure email to [facility] SNF social worker [name] on 12/14/18, questioning if family was able to secure LTC placement at facility yet. [name] reports that she is not sure and will get back with [facility] CSW and HCP once she finds out. [facility] CSW provided update to [place] Director as well.  Eula Fried, BSW, MSW, [name] Phone: [phone] Fax: [phone]

## 2018-12-15 ENCOUNTER — Other Ambulatory Visit: Payer: Self-pay | Admitting: Licensed Clinical Social Worker

## 2018-12-15 NOTE — Patient Outreach (Addendum)
[place]) Care Management  12/15/2018  [name] 04-Jan-1941 [id number]  [facility] CSW spoke with Marita [place], Education officer, museum at [facility] and was informed that family successfully applied for Medicaid and patient is eligible for long term placement now. However, patient will not reside under long term at their facility and they will be working on transitioning patient to another facility of the family's choosing. SNF social worker agreeable to keep [facility] CSW updated. [facility] CSW sent secure email to [facility] with [facility] informing him to close case as patient will not return home post SNF discharge now but to another skilled nursing facility. [facility] CSW will follow up within two weeks.   Eula Fried, BSW, MSW, [email or web address] Phone: [phone] Fax: [phone]

## 2018-12-15 NOTE — Patient Outreach (Signed)
[facility]) Care Management  12/15/2018  KEYSEAN SAVINO 1941/02/07 [id number]  [facility] CSW checked PING and saw that patient is still at [facility] and has not discharged. [facility] CSW will await for updates on LTC placement from [place], Education officer, museum at [facility].  Eula Fried, BSW, MSW, [email or web address] Phone: [phone] Fax: [phone]

## 2018-12-17 DIAGNOSIS — L89153 Pressure ulcer of sacral region, stage 3: Secondary | ICD-10-CM | POA: Diagnosis not present

## 2018-12-21 ENCOUNTER — Ambulatory Visit: Payer: PPO | Admitting: Family Medicine

## 2018-12-24 ENCOUNTER — Other Ambulatory Visit: Payer: Self-pay | Admitting: *Deleted

## 2018-12-24 DIAGNOSIS — F419 Anxiety disorder, unspecified: Secondary | ICD-10-CM | POA: Diagnosis not present

## 2018-12-24 DIAGNOSIS — F323 Major depressive disorder, single episode, severe with psychotic features: Secondary | ICD-10-CM | POA: Diagnosis not present

## 2018-12-24 NOTE — Patient Outreach (Signed)
  [place] [facility]) Care Management  12/24/2018  STEFFON GLADU [birth year] [id number]   Post Acute Care Coordination  This social worker met with he SNF discharge planner at [facility] as covering social worker in the absence of Eula Fried, [place] to discuss patient's discharge plan. It was confirmed that patient will be remaining in the facility until his Special Assistance Medicaid is approved. Once his medicaid is approved, the [place] discharge planner will assist patient's family in identifying a long term care facility for patient to transition to.  Plan: This Education officer, museum will inform assigned social worker Eula Fried, [place].  Sheralyn [facility] Care Management [phone]

## 2018-12-25 DIAGNOSIS — R627 Adult failure to thrive: Secondary | ICD-10-CM | POA: Diagnosis not present

## 2018-12-25 DIAGNOSIS — F321 Major depressive disorder, single episode, moderate: Secondary | ICD-10-CM | POA: Diagnosis not present

## 2018-12-25 DIAGNOSIS — R630 Anorexia: Secondary | ICD-10-CM | POA: Diagnosis not present

## 2018-12-25 DIAGNOSIS — F05 Delirium due to known physiological condition: Secondary | ICD-10-CM | POA: Diagnosis not present

## 2018-12-25 DIAGNOSIS — R5383 Other fatigue: Secondary | ICD-10-CM | POA: Diagnosis not present

## 2018-12-27 DIAGNOSIS — R0689 Other abnormalities of breathing: Secondary | ICD-10-CM | POA: Diagnosis not present

## 2018-12-27 DIAGNOSIS — S82209A Unspecified fracture of shaft of unspecified tibia, initial encounter for closed fracture: Secondary | ICD-10-CM | POA: Diagnosis not present

## 2018-12-27 DIAGNOSIS — R269 Unspecified abnormalities of gait and mobility: Secondary | ICD-10-CM | POA: Diagnosis not present

## 2018-12-27 DIAGNOSIS — R0609 Other forms of dyspnea: Secondary | ICD-10-CM | POA: Diagnosis not present

## 2018-12-27 DIAGNOSIS — J449 Chronic obstructive pulmonary disease, unspecified: Secondary | ICD-10-CM | POA: Diagnosis not present

## 2018-12-27 DIAGNOSIS — E1165 Type 2 diabetes mellitus with hyperglycemia: Secondary | ICD-10-CM | POA: Diagnosis not present

## 2018-12-28 DIAGNOSIS — Z139 Encounter for screening, unspecified: Secondary | ICD-10-CM | POA: Diagnosis not present

## 2018-12-29 DIAGNOSIS — R627 Adult failure to thrive: Secondary | ICD-10-CM | POA: Diagnosis not present

## 2018-12-29 DIAGNOSIS — E860 Dehydration: Secondary | ICD-10-CM | POA: Diagnosis not present

## 2018-12-29 DIAGNOSIS — E870 Hyperosmolality and hypernatremia: Secondary | ICD-10-CM | POA: Diagnosis not present

## 2018-12-29 DIAGNOSIS — E876 Hypokalemia: Secondary | ICD-10-CM | POA: Diagnosis not present

## 2018-12-31 ENCOUNTER — Non-Acute Institutional Stay: Payer: PPO | Admitting: Primary Care

## 2018-12-31 DIAGNOSIS — I5023 Acute on chronic systolic (congestive) heart failure: Secondary | ICD-10-CM | POA: Diagnosis not present

## 2018-12-31 DIAGNOSIS — Z515 Encounter for palliative care: Secondary | ICD-10-CM | POA: Diagnosis not present

## 2018-12-31 DIAGNOSIS — E876 Hypokalemia: Secondary | ICD-10-CM | POA: Diagnosis not present

## 2018-12-31 DIAGNOSIS — Z86711 Personal history of pulmonary embolism: Secondary | ICD-10-CM

## 2018-12-31 DIAGNOSIS — E870 Hyperosmolality and hypernatremia: Secondary | ICD-10-CM | POA: Diagnosis not present

## 2018-12-31 NOTE — Progress Notes (Signed)
Community Palliative Care Telephone: [phone] Fax: [phone]  PATIENT NAME: [name] DOB: 1941/05/14 MRN: [id number]  PRIMARY CARE PROVIDER:   [place] Bing, DO  REFERRING PROVIDER:  [place] Bing, DO [place], [place] [address]  RESPONSIBLE PARTY:   Extended Emergency Contact Information Primary Emergency Contact: Lindell Spar [place] of [place] Phone: [phone] Mobile Phone: [phone] Relation: Daughter Secondary Emergency Contact: Vida Roller, [place] of [place] Phone: [phone] Relation: Friend   ASSESSMENT and RECOMMENDATIONS :   Initial assessment   1. Heat Failure management: Hypernatremia//hypokalemia d/t medications. SNF NP is managing, patient states he is feeling better today. States nausea yesterday and little appetite.  2. Dyspnea: On oxygen 4 L. He does not use his CPAP per facility staff.  3.Goals of Care: No MOST on chart. Will discuss with  Patient on next visit RE wishes for interventions.  Palliative to follow for symptom management, goals of care clarification. Return 2-4 weeks.  I spent 25 minutes providing this consultation,  from 1200 to 1225. More than 50% of the time in this consultation was spent coordinating communication.   HISTORY OF PRESENT ILLNESS:  [name] is a 78 y.o. year old male with multiple medical problems including HF, HTN, OA, Morbid obesity, DM2. Sleep apnea.  Palliative Care was asked to help address goals of care.   CODE STATUS: TBD  PPS: 30% HOSPICE ELIGIBILITY/DIAGNOSIS: TBD  PAST MEDICAL HISTORY:  Past Medical History:  Diagnosis Date  . Chronic osteomyelitis of lower leg (HCC) 11/20/2010   Chronic osteoarthritis of right lower leg-10/03/10 note by Dr. Harvie Heck scanned of this visit under media tab. Dr. Sharol Given states that this is chronic osteomyelitis, only cure would be amputation. Patient was not agreeable to this at his  visit with Dr. Sharol Given. Patient elected to apply Bactroban cream, Dr. Sharol Given and time of that visit also prescribe doxycycline. Dr. Sharol Given stay the patient she come to the [place] 02/26/2007   Qualifier: Diagnosis of  By: Erling Cruz  MD, [place]    . Complication of anesthesia   . Diabetes mellitus   . Diabetic peripheral neuropathy associated with type 2 diabetes mellitus ([place]) 02/07/2014  . GERD (gastroesophageal reflux disease)   . Grade II diastolic dysfunction   . Heart murmur    last 2D Echo -03/31/08  . HIATAL HERNIA WITH REFLUX 09/29/2006   Qualifier: Diagnosis of  By: Erling Cruz  MD, MELISSA    . HIP REPLACEMENT, BILATERAL, HX OF 05/12/2007   Qualifier: Diagnosis of  By: Erling Cruz  MD, MELISSA    . History of blood transfusion   . Hypertension   . OSTEOARTHRITIS 03/05/2007   Qualifier: Diagnosis of  By: Erling Cruz  MD, MELISSA    . Paget's disease of bony pelvis   . PONV (postoperative nausea and vomiting)   . Prostate cancer ([place])   . SBO (small bowel obstruction) ([place]) 02/2017  . Seasonal allergies   . VENOUS INSUFFICIENCY, CHRONIC 02/26/2007   Qualifier: Diagnosis of  By: Erling Cruz  MD, MELISSA      SOCIAL HX:  Social History   Tobacco Use  . Smoking status: Never Smoker  . Smokeless tobacco: Never Used  Substance Use Topics  . Alcohol use: No    ALLERGIES:  Allergies  Allergen Reactions  . Ace Inhibitors Cough    With Lisinopril.  . Angiotensin Receptor Blockers Other (See Comments)  Caused excessive weight gain  . Metformin Diarrhea     PERTINENT MEDICATIONS:  Outpatient Encounter Medications as of 12/31/2018  Medication Sig  . Amino Acids-Protein Hydrolys (FEEDING SUPPLEMENT, PRO-STAT SUGAR FREE 64,) LIQD Take 30 mLs by mouth 2 (two) times daily.  Marland Kitchen apixaban (ELIQUIS) 5 MG TABS tablet Take 1 tablet (5 mg total) by mouth 2 (two) times daily.  . collagenase (SANTYL) ointment Apply 1 application topically daily. Apply to right rear thigh topically every day shift for affwound care apply  with calcium alginate  . famotidine (PEPCID) 20 MG tablet Take 2 tablets (40 mg total) by mouth daily.  . ferrous sulfate 325 (65 FE) MG tablet Take 1 tablet (325 mg total) by mouth 2 (two) times daily with a meal.  . FLOVENT HFA 220 MCG/ACT inhaler INHALE 1 PUFF INTO THE LUNGS TWICE DAILY (Patient taking differently: Inhale 1 puff into the lungs 2 (two) times daily. )  . fluticasone (FLONASE) 50 MCG/ACT nasal spray Place 2 sprays into both nostrils daily.  . furosemide (LASIX) 40 MG tablet Take 1 tablet (40 mg total) by mouth daily.  Marland Kitchen guaiFENesin (ROBITUSSIN) 100 MG/5ML liquid Take 200 mg by mouth 3 (three) times daily as needed for cough. Take 15ml by mouth every 8 hours for cough/congestion for 7 days  . hydrocortisone (CORTEF) 5 MG tablet Take 1 tablet (5 mg total) by mouth daily.  . Infant Care Products (DERMACLOUD) CREA Apply 1 application topically 3 (three) times daily.  . insulin aspart (NOVOLOG) 100 UNIT/ML injection CBG 70 - 120: 0 units CBG 121 - 150: 2 units CBG 151 - 200: 3 units CBG 201 - 250: 5 units CBG 251 - 300: 8 units CBG 301 - 350: 11 units CBG 351 - 400: 15 units  . latanoprost (XALATAN) 0.005 % ophthalmic solution INSTILL 1 DROP IN BOTH EYES EVERY EVENING FOR GLAUCOMA (Patient taking differently: Place 1 drop into both eyes at bedtime. )  . metoprolol tartrate (LOPRESSOR) 25 MG tablet Take 0.5 tablets (12.5 mg total) by mouth 2 (two) times daily.  Marland Kitchen nystatin (MYCOSTATIN/NYSTOP) powder Apply topically 2 (two) times daily.  . pantoprazole (PROTONIX) 40 MG tablet Take 1 tablet (40 mg total) by mouth 2 (two) times daily before a meal.  . polyethylene glycol (MIRALAX / GLYCOLAX) packet Take 17 g by mouth daily as needed for mild constipation.  . potassium chloride 20 MEQ TBCR Take 20 mEq by mouth daily. Use when taking Lasix  . pravastatin (PRAVACHOL) 40 MG tablet TAKE 1 TABLET BY MOUTH DAILY (Patient taking differently: Take 40 mg by mouth daily. )  . PROAIR HFA 108 (90  Base) MCG/ACT inhaler INHALE 2 PUFFS BY MOUTH EVERY 4 HOURS AS NEEDED FOR WHEEZING OR SHORTNESS OF BREATH (Patient taking differently: Inhale 2 puffs into the lungs every 4 (four) hours as needed for wheezing or shortness of breath. )  . Probiotic Product (PROBIOTIC PO) Take 2 capsules by mouth daily.  . sucralfate (CARAFATE) 1 g tablet Take 1 tablet (1 g total) by mouth 4 (four) times daily -  with meals and at bedtime.  . tamsulosin (FLOMAX) 0.4 MG CAPS capsule TAKE 2 CAPSULES BY MOUTH EVERY MORNING (Patient taking differently: Take 0.8 mg by mouth daily. )  . urea 10 % lotion Apply topically 2 (two) times daily.   No facility-administered encounter medications on file as of 12/31/2018.     PHYSICAL EXAM:   VS: 103/79 85-22 97.5 95% 4 l oxygen  General: NAD,  frail appearing, obese HEENT: c/o dry pharynx,trouble swallowing, h/o sleep apnea Cardiovascular: regular rate and rhythm, S1S2, murmur Pulmonary: clear right  Fields, Left moving air poorly. DOE with  Talking, productive cough, oxygen per [place],  Abdomen: soft, nontender, + bowel sounds, poor appetite, bowels regular Extremities: 3+ LE edema, decreased LE strength Skin: no rashes, no wounds on gross exam Neurological: Weakness, memory loss  Cyndia Skeeters DNP, AGPCNP-BC

## 2019-01-05 ENCOUNTER — Other Ambulatory Visit: Payer: Self-pay | Admitting: Licensed Clinical Social Worker

## 2019-01-05 NOTE — Patient Outreach (Signed)
[facility]) Care Management  01/05/2019  [name] [birth year] [id number]  [facility] CSW arrived at [facility] and successfully completed [facility] Consult. Per SNF social worker, patient is now long term and will be long term at [facility] instead of transitioning to a different facility. [facility] CSW successfully met with patient. Patient reports that he still prefers to return home but understands why this is not possible right now. Patient met with palliative care on 12/24/18 and they plan to follow up with patient in 2-4 weeks. Palliative Care was asked to help address goals of care. Patient is agreeable to [place] case closure at this time now that he is long term at SNF. [facility] CSW left patient contact information in the case that his discharge plans change. [facility] CSW will sign off and complete case closure at this time.  [name], BSW, MSW, [email or web address] Phone: [phone] Fax: [phone]

## 2019-01-07 DIAGNOSIS — L89153 Pressure ulcer of sacral region, stage 3: Secondary | ICD-10-CM | POA: Diagnosis not present

## 2019-01-08 ENCOUNTER — Encounter (HOSPITAL_BASED_OUTPATIENT_CLINIC_OR_DEPARTMENT_OTHER): Payer: PPO

## 2019-01-14 ENCOUNTER — Ambulatory Visit (HOSPITAL_BASED_OUTPATIENT_CLINIC_OR_DEPARTMENT_OTHER): Payer: PPO | Attending: Pulmonary Disease

## 2019-01-14 ENCOUNTER — Non-Acute Institutional Stay: Payer: PPO | Admitting: Primary Care

## 2019-01-14 DIAGNOSIS — E440 Moderate protein-calorie malnutrition: Secondary | ICD-10-CM

## 2019-01-14 DIAGNOSIS — Z515 Encounter for palliative care: Secondary | ICD-10-CM

## 2019-01-14 DIAGNOSIS — E860 Dehydration: Secondary | ICD-10-CM | POA: Diagnosis not present

## 2019-01-14 DIAGNOSIS — R627 Adult failure to thrive: Secondary | ICD-10-CM | POA: Diagnosis not present

## 2019-01-14 DIAGNOSIS — I5023 Acute on chronic systolic (congestive) heart failure: Secondary | ICD-10-CM

## 2019-01-14 DIAGNOSIS — R630 Anorexia: Secondary | ICD-10-CM | POA: Diagnosis not present

## 2019-01-14 NOTE — Progress Notes (Signed)
[facility] Telephone: [phone] Fax: [phone]  PATIENT NAME: [name] DOB: 03/24/1941 MRN: [id number]  PRIMARY CARE PROVIDER:   Garwin Brothers, MD  REFERRING PROVIDER:  Garwin Brothers, MD [place], [place] [address]  RESPONSIBLE PARTY:   Extended Emergency Contact Information Primary Emergency Contact: Lindell Spar [place] Phone: [phone] Mobile Phone: [phone] Relation: Daughter Secondary Emergency Contact: Vida Roller, [place] Phone: [phone] Relation: Friend  Mr. Soham Hollett recently admitted palliative patient with heart failure, dm, history of respiratory infections/failure, obesity. Has been a long time resident but recently began declining.   ASSESSMENT and RECOMMENDATIONS:   1. Dyspnea: He is ill appearing today with little air movement in all lung fields. Recommend scheduled albuterol nebulizer treatments with PRN nebulizer treatments available. Also is being assessed for his non-use of CPAP at HS. He is going to switch from a facemask to a nose mask for better compliance, hopefully.  In addition is on oxygen at 2 to 4 L and PO2 = 98% but still exhibits signs of dyspnea. Dyspnea would be well-managed with Hospice symptom approach management, see note below. The nursing home as obtain the supplies for CPAP and he will test the new apparatus tonight.   2. Dementia: Increased periods of agitation and confusion. Support with symptom management if pt is distressed by  behaviors.   3. Goals of care  meeting was held today and daughter Ivin Booty attended. I spoke with her about Power of attorney, she stated she is the main adult child  who lives nearby and all others live out of state. She does interact with her siblings for decisions. There is not a  copy of the advance directive in the Code status  notebook at the nurses station. Introduce the concept of Hospice and  patient's medical appropriateness for the service. While he is likely not imminent he does qualify based on his functional decline over the past several months. He is eating less,  his activity has declined and his mentation is starting to change with increased periods of confusion, especially sundowning. His daughter decided to proceed with hospice referral and Martinique Blattenberger NP was in attendance and will generate. Discussed type of hospice services available,  service reimbursements etc. Also discussed patient's previously communicated desire to have limited scope of interventions which can be done at the nursing home, e.g. IV fluids antibiotics and comfort care. Discussed with daughter increasing frailty and that patient would likely not receive any benefit from resuscitation or aggressive treatments.   Palliative to follow prn until admission to hospice.  I spent 80 minutes providing this consultation,  from 1400 to 1520. More than 50% of the time in this consultation was spent coordinating communication.   HISTORY OF PRESENT ILLNESS:  [name] is a 78 y.o. year old male with multiple medical problems including CHF, HTN, OA, Morbid obesity, DM2. Sleep apnea, memory loss. Palliative Care was asked to help address goals of care.   CODE STATUS:  DNR, limited scope pe family report, not yet documented  PPS: 30% HOSPICE ELIGIBILITY/DIAGNOSIS: yes/ CHF, respiratory failure  PAST MEDICAL HISTORY:  Past Medical History:  Diagnosis Date  . Chronic osteomyelitis of lower leg (HCC) 11/20/2010   Chronic osteoarthritis of right lower leg-10/03/10 note by Dr. Harvie Heck scanned of this visit under media tab. Dr. Sharol Given states that this is chronic osteomyelitis, only cure  would be amputation. Patient was not agreeable to this at his visit with Dr. Sharol Given. Patient elected to apply Bactroban cream, Dr. Sharol Given and time of that visit also prescribe doxycycline. Dr. Sharol Given stay the patient she come to the [facility] 02/26/2007   Qualifier: Diagnosis of  By: Erling Cruz  MD, [place]    . Complication of anesthesia   . Diabetes mellitus   . Diabetic peripheral neuropathy associated with type 2 diabetes mellitus ([place]) 02/07/2014  . GERD (gastroesophageal reflux disease)   . Grade II diastolic dysfunction   . Heart murmur    last 2D Echo -03/31/08  . HIATAL HERNIA WITH REFLUX 09/29/2006   Qualifier: Diagnosis of  By: Erling Cruz  MD, MELISSA    . HIP REPLACEMENT, BILATERAL, HX OF 05/12/2007   Qualifier: Diagnosis of  By: Erling Cruz  MD, MELISSA    . History of blood transfusion   . Hypertension   . OSTEOARTHRITIS 03/05/2007   Qualifier: Diagnosis of  By: Erling Cruz  MD, MELISSA    . Paget's disease of bony pelvis   . PONV (postoperative nausea and vomiting)   . Prostate cancer ([place])   . SBO (small bowel obstruction) ([place]) 02/2017  . Seasonal allergies   . VENOUS INSUFFICIENCY, CHRONIC 02/26/2007   Qualifier: Diagnosis of  By: Erling Cruz  MD, MELISSA      SOCIAL HX:  Social History   Tobacco Use  . Smoking status: Never Smoker  . Smokeless tobacco: Never Used  Substance Use Topics  . Alcohol use: No    ALLERGIES:  Allergies  Allergen Reactions  . Ace Inhibitors Cough    With Lisinopril.  . Angiotensin Receptor Blockers Other (See Comments)    Caused excessive weight gain  . Metformin Diarrhea     PERTINENT MEDICATIONS:  Outpatient Encounter Medications as of 01/14/2019  Medication Sig  . Amino Acids-Protein Hydrolys (FEEDING SUPPLEMENT, PRO-STAT SUGAR FREE 64,) LIQD Take 30 mLs by mouth 2 (two) times daily.  Marland Kitchen apixaban (ELIQUIS) 5 MG TABS tablet Take 1 tablet (5 mg total) by mouth 2 (two) times daily.  . collagenase (SANTYL) ointment Apply 1 application topically daily. Apply to right rear thigh topically every day shift for affwound care apply with calcium alginate  . famotidine (PEPCID) 20 MG tablet Take 2 tablets (40 mg total) by mouth daily.  . ferrous sulfate 325 (65 FE) MG tablet Take 1 tablet (325 mg  total) by mouth 2 (two) times daily with a meal.  . FLOVENT HFA 220 MCG/ACT inhaler INHALE 1 PUFF INTO THE LUNGS TWICE DAILY (Patient taking differently: Inhale 1 puff into the lungs 2 (two) times daily. )  . fluticasone (FLONASE) 50 MCG/ACT nasal spray Place 2 sprays into both nostrils daily.  . furosemide (LASIX) 40 MG tablet Take 1 tablet (40 mg total) by mouth daily.  Marland Kitchen guaiFENesin (ROBITUSSIN) 100 MG/5ML liquid Take 200 mg by mouth 3 (three) times daily as needed for cough. Take 39ml by mouth every 8 hours for cough/congestion for 7 days  . hydrocortisone (CORTEF) 5 MG tablet Take 1 tablet (5 mg total) by mouth daily.  . Infant Care Products (DERMACLOUD) CREA Apply 1 application topically 3 (three) times daily.  . insulin aspart (NOVOLOG) 100 UNIT/ML injection CBG 70 - 120: 0 units CBG 121 - 150: 2 units CBG 151 - 200: 3 units CBG 201 - 250: 5 units CBG 251 - 300: 8 units CBG 301 - 350: 11 units CBG 351 - 400: 15 units  .  latanoprost (XALATAN) 0.005 % ophthalmic solution INSTILL 1 DROP IN BOTH EYES EVERY EVENING FOR GLAUCOMA (Patient taking differently: Place 1 drop into both eyes at bedtime. )  . metoprolol tartrate (LOPRESSOR) 25 MG tablet Take 0.5 tablets (12.5 mg total) by mouth 2 (two) times daily.  Marland Kitchen nystatin (MYCOSTATIN/NYSTOP) powder Apply topically 2 (two) times daily.  . pantoprazole (PROTONIX) 40 MG tablet Take 1 tablet (40 mg total) by mouth 2 (two) times daily before a meal.  . polyethylene glycol (MIRALAX / GLYCOLAX) packet Take 17 g by mouth daily as needed for mild constipation.  . potassium chloride 20 MEQ TBCR Take 20 mEq by mouth daily. Use when taking Lasix  . pravastatin (PRAVACHOL) 40 MG tablet TAKE 1 TABLET BY MOUTH DAILY (Patient taking differently: Take 40 mg by mouth daily. )  . PROAIR HFA 108 (90 Base) MCG/ACT inhaler INHALE 2 PUFFS BY MOUTH EVERY 4 HOURS AS NEEDED FOR WHEEZING OR SHORTNESS OF BREATH (Patient taking differently: Inhale 2 puffs into the lungs every  4 (four) hours as needed for wheezing or shortness of breath. )  . Probiotic Product (PROBIOTIC PO) Take 2 capsules by mouth daily.  . sucralfate (CARAFATE) 1 g tablet Take 1 tablet (1 g total) by mouth 4 (four) times daily -  with meals and at bedtime.  . tamsulosin (FLOMAX) 0.4 MG CAPS capsule TAKE 2 CAPSULES BY MOUTH EVERY MORNING (Patient taking differently: Take 0.8 mg by mouth daily. )  . urea 10 % lotion Apply topically 2 (two) times daily.   No facility-administered encounter medications on file as of 01/14/2019.     PHYSICAL EXAM:  VS 98.7-70-20 100/80, denies pain but states discomfort Oxygen 99% on 4 l  General: NAD, frail appearing,obese Cardiovascular: regular rate and rhythm, S1 S2, murmur Pulmonary: poor air movement all fields, no cough, oxygen in place at 4 L/ min Abdomen: soft, nontender, + bowel sounds Extremities: no edema with wraps in place, no joint deformities Skin: no rashes, wounds Neurological: Weakness,confusion, memory loss  Cyndia Skeeters, DNP, AGPCNP-BC

## 2019-01-20 DIAGNOSIS — E11649 Type 2 diabetes mellitus with hypoglycemia without coma: Secondary | ICD-10-CM | POA: Diagnosis not present

## 2019-01-20 DIAGNOSIS — G8929 Other chronic pain: Secondary | ICD-10-CM | POA: Diagnosis not present

## 2019-01-20 DIAGNOSIS — M25519 Pain in unspecified shoulder: Secondary | ICD-10-CM | POA: Diagnosis not present

## 2019-01-20 DIAGNOSIS — R627 Adult failure to thrive: Secondary | ICD-10-CM | POA: Diagnosis not present

## 2019-01-22 DIAGNOSIS — F323 Major depressive disorder, single episode, severe with psychotic features: Secondary | ICD-10-CM | POA: Diagnosis not present

## 2019-01-22 DIAGNOSIS — F321 Major depressive disorder, single episode, moderate: Secondary | ICD-10-CM | POA: Diagnosis not present

## 2019-01-22 DIAGNOSIS — F419 Anxiety disorder, unspecified: Secondary | ICD-10-CM | POA: Diagnosis not present

## 2019-01-27 DIAGNOSIS — R269 Unspecified abnormalities of gait and mobility: Secondary | ICD-10-CM | POA: Diagnosis not present

## 2019-01-27 DIAGNOSIS — R0609 Other forms of dyspnea: Secondary | ICD-10-CM | POA: Diagnosis not present

## 2019-01-27 DIAGNOSIS — S82209A Unspecified fracture of shaft of unspecified tibia, initial encounter for closed fracture: Secondary | ICD-10-CM | POA: Diagnosis not present

## 2019-01-27 DIAGNOSIS — E1165 Type 2 diabetes mellitus with hyperglycemia: Secondary | ICD-10-CM | POA: Diagnosis not present

## 2019-01-27 DIAGNOSIS — J449 Chronic obstructive pulmonary disease, unspecified: Secondary | ICD-10-CM | POA: Diagnosis not present

## 2019-01-27 DIAGNOSIS — R0689 Other abnormalities of breathing: Secondary | ICD-10-CM | POA: Diagnosis not present

## 2019-01-29 DIAGNOSIS — F419 Anxiety disorder, unspecified: Secondary | ICD-10-CM | POA: Diagnosis not present

## 2019-01-29 DIAGNOSIS — F323 Major depressive disorder, single episode, severe with psychotic features: Secondary | ICD-10-CM | POA: Diagnosis not present

## 2019-02-04 DIAGNOSIS — I5023 Acute on chronic systolic (congestive) heart failure: Secondary | ICD-10-CM | POA: Diagnosis not present

## 2019-02-04 DIAGNOSIS — R627 Adult failure to thrive: Secondary | ICD-10-CM | POA: Diagnosis not present

## 2019-02-04 DIAGNOSIS — R112 Nausea with vomiting, unspecified: Secondary | ICD-10-CM | POA: Diagnosis not present

## 2019-02-04 DIAGNOSIS — F05 Delirium due to known physiological condition: Secondary | ICD-10-CM | POA: Diagnosis not present

## 2019-02-12 DIAGNOSIS — F05 Delirium due to known physiological condition: Secondary | ICD-10-CM | POA: Diagnosis not present

## 2019-02-12 DIAGNOSIS — F419 Anxiety disorder, unspecified: Secondary | ICD-10-CM | POA: Diagnosis not present

## 2019-02-12 DIAGNOSIS — R112 Nausea with vomiting, unspecified: Secondary | ICD-10-CM | POA: Diagnosis not present

## 2019-02-12 DIAGNOSIS — R627 Adult failure to thrive: Secondary | ICD-10-CM | POA: Diagnosis not present

## 2019-02-12 DIAGNOSIS — F22 Delusional disorders: Secondary | ICD-10-CM | POA: Diagnosis not present

## 2019-02-12 DIAGNOSIS — F323 Major depressive disorder, single episode, severe with psychotic features: Secondary | ICD-10-CM | POA: Diagnosis not present

## 2019-02-12 DIAGNOSIS — F321 Major depressive disorder, single episode, moderate: Secondary | ICD-10-CM | POA: Diagnosis not present

## 2019-02-18 DIAGNOSIS — R627 Adult failure to thrive: Secondary | ICD-10-CM | POA: Diagnosis not present

## 2019-02-18 DIAGNOSIS — F05 Delirium due to known physiological condition: Secondary | ICD-10-CM | POA: Diagnosis not present

## 2019-02-18 DIAGNOSIS — R112 Nausea with vomiting, unspecified: Secondary | ICD-10-CM | POA: Diagnosis not present

## 2019-02-18 DIAGNOSIS — F22 Delusional disorders: Secondary | ICD-10-CM | POA: Diagnosis not present

## 2019-02-19 DIAGNOSIS — F419 Anxiety disorder, unspecified: Secondary | ICD-10-CM | POA: Diagnosis not present

## 2019-02-19 DIAGNOSIS — F323 Major depressive disorder, single episode, severe with psychotic features: Secondary | ICD-10-CM | POA: Diagnosis not present

## 2019-02-26 DIAGNOSIS — F419 Anxiety disorder, unspecified: Secondary | ICD-10-CM | POA: Diagnosis not present

## 2019-02-26 DIAGNOSIS — F323 Major depressive disorder, single episode, severe with psychotic features: Secondary | ICD-10-CM | POA: Diagnosis not present

## 2019-02-26 DIAGNOSIS — R627 Adult failure to thrive: Secondary | ICD-10-CM | POA: Diagnosis not present

## 2019-02-26 DIAGNOSIS — R112 Nausea with vomiting, unspecified: Secondary | ICD-10-CM | POA: Diagnosis not present

## 2019-02-26 DIAGNOSIS — E11649 Type 2 diabetes mellitus with hypoglycemia without coma: Secondary | ICD-10-CM | POA: Diagnosis not present

## 2019-02-27 DIAGNOSIS — J449 Chronic obstructive pulmonary disease, unspecified: Secondary | ICD-10-CM | POA: Diagnosis not present

## 2019-02-27 DIAGNOSIS — E1165 Type 2 diabetes mellitus with hyperglycemia: Secondary | ICD-10-CM | POA: Diagnosis not present

## 2019-02-27 DIAGNOSIS — R269 Unspecified abnormalities of gait and mobility: Secondary | ICD-10-CM | POA: Diagnosis not present

## 2019-02-27 DIAGNOSIS — S82209A Unspecified fracture of shaft of unspecified tibia, initial encounter for closed fracture: Secondary | ICD-10-CM | POA: Diagnosis not present

## 2019-02-27 DIAGNOSIS — R0609 Other forms of dyspnea: Secondary | ICD-10-CM | POA: Diagnosis not present

## 2019-02-27 DIAGNOSIS — R0689 Other abnormalities of breathing: Secondary | ICD-10-CM | POA: Diagnosis not present

## 2019-03-05 DIAGNOSIS — F323 Major depressive disorder, single episode, severe with psychotic features: Secondary | ICD-10-CM | POA: Diagnosis not present

## 2019-03-05 DIAGNOSIS — F419 Anxiety disorder, unspecified: Secondary | ICD-10-CM | POA: Diagnosis not present

## 2019-03-09 DIAGNOSIS — R627 Adult failure to thrive: Secondary | ICD-10-CM | POA: Diagnosis not present

## 2019-03-09 DIAGNOSIS — R1013 Epigastric pain: Secondary | ICD-10-CM | POA: Diagnosis not present

## 2019-03-09 DIAGNOSIS — M6281 Muscle weakness (generalized): Secondary | ICD-10-CM | POA: Diagnosis not present

## 2019-03-09 DIAGNOSIS — R1084 Generalized abdominal pain: Secondary | ICD-10-CM | POA: Diagnosis not present

## 2019-03-11 DIAGNOSIS — G9340 Encephalopathy, unspecified: Secondary | ICD-10-CM | POA: Diagnosis not present

## 2019-03-11 DIAGNOSIS — I5023 Acute on chronic systolic (congestive) heart failure: Secondary | ICD-10-CM | POA: Diagnosis not present

## 2019-03-11 DIAGNOSIS — R627 Adult failure to thrive: Secondary | ICD-10-CM | POA: Diagnosis not present

## 2019-03-11 DIAGNOSIS — F0150 Vascular dementia without behavioral disturbance: Secondary | ICD-10-CM | POA: Diagnosis not present

## 2019-03-15 DIAGNOSIS — R4182 Altered mental status, unspecified: Secondary | ICD-10-CM | POA: Diagnosis not present

## 2019-03-15 DIAGNOSIS — I5023 Acute on chronic systolic (congestive) heart failure: Secondary | ICD-10-CM | POA: Diagnosis not present

## 2019-03-15 DIAGNOSIS — R627 Adult failure to thrive: Secondary | ICD-10-CM | POA: Diagnosis not present

## 2019-03-15 DIAGNOSIS — G9340 Encephalopathy, unspecified: Secondary | ICD-10-CM | POA: Diagnosis not present

## 2019-03-31 DEATH — deceased

## 2020-04-24 IMAGING — DX DG CHEST 1V PORT
1 series · 1 of 1 positions shown · non-contrast
Comparison: CT chest 06/04/2018, radiograph 06/04/2018, 05/19/2018

CLINICAL DATA: Sepsis

EXAM:
PORTABLE CHEST 1 VIEW

[chest]
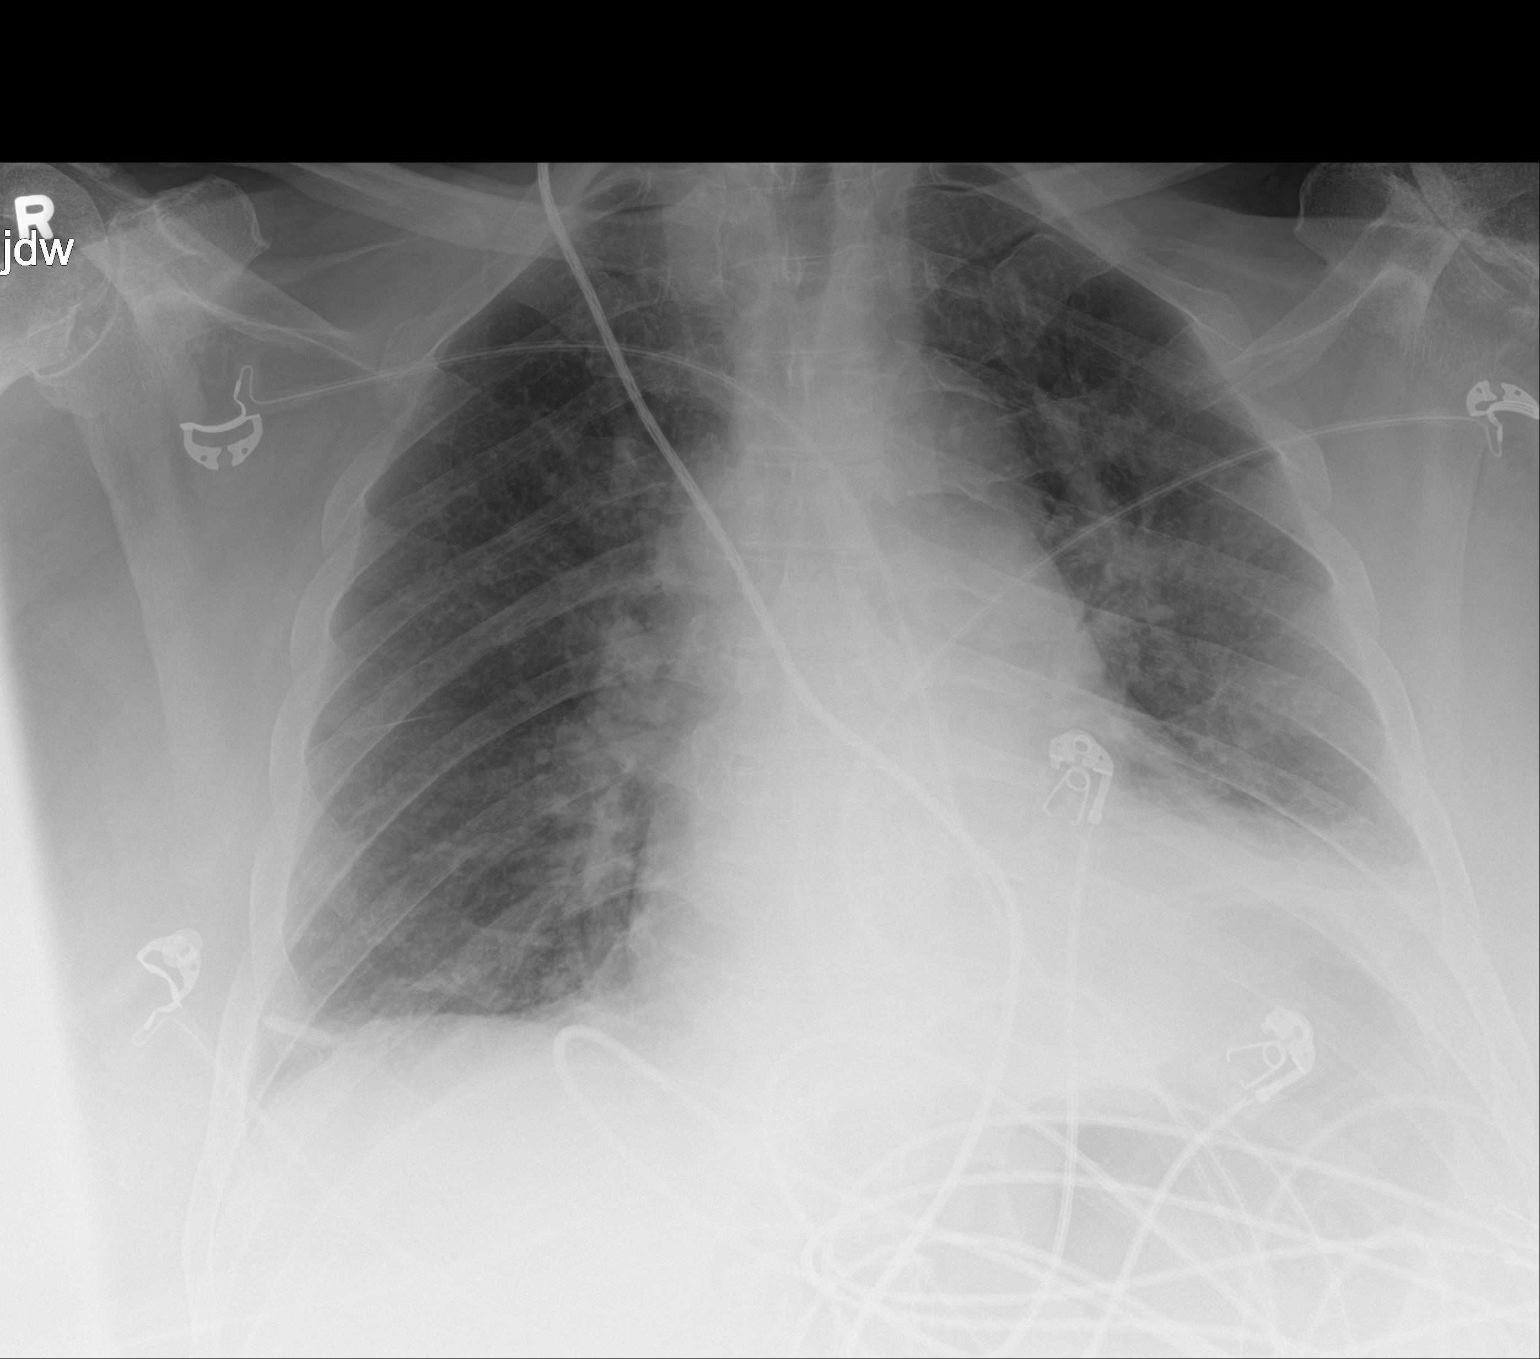

[1 of 1 positions shown; findings below may reference images not displayed]

FINDINGS: Small left pleural effusion with lingular and left base opacity.
Cardiomegaly with mild vascular congestion. No pneumothorax.
IMPRESSION: 1. Cardiomegaly with vascular congestion
2. Probable small left effusion. Airspace disease at the lingula and
left base may reflect atelectasis or pneumonia.

## 2020-04-27 IMAGING — DX DG CHEST 1V PORT
1 series · 1 of 1 positions shown · non-contrast
Comparison: 07/28/2018.

CLINICAL DATA: Intubation.

EXAM:
PORTABLE CHEST 1 VIEW

[chest ap]
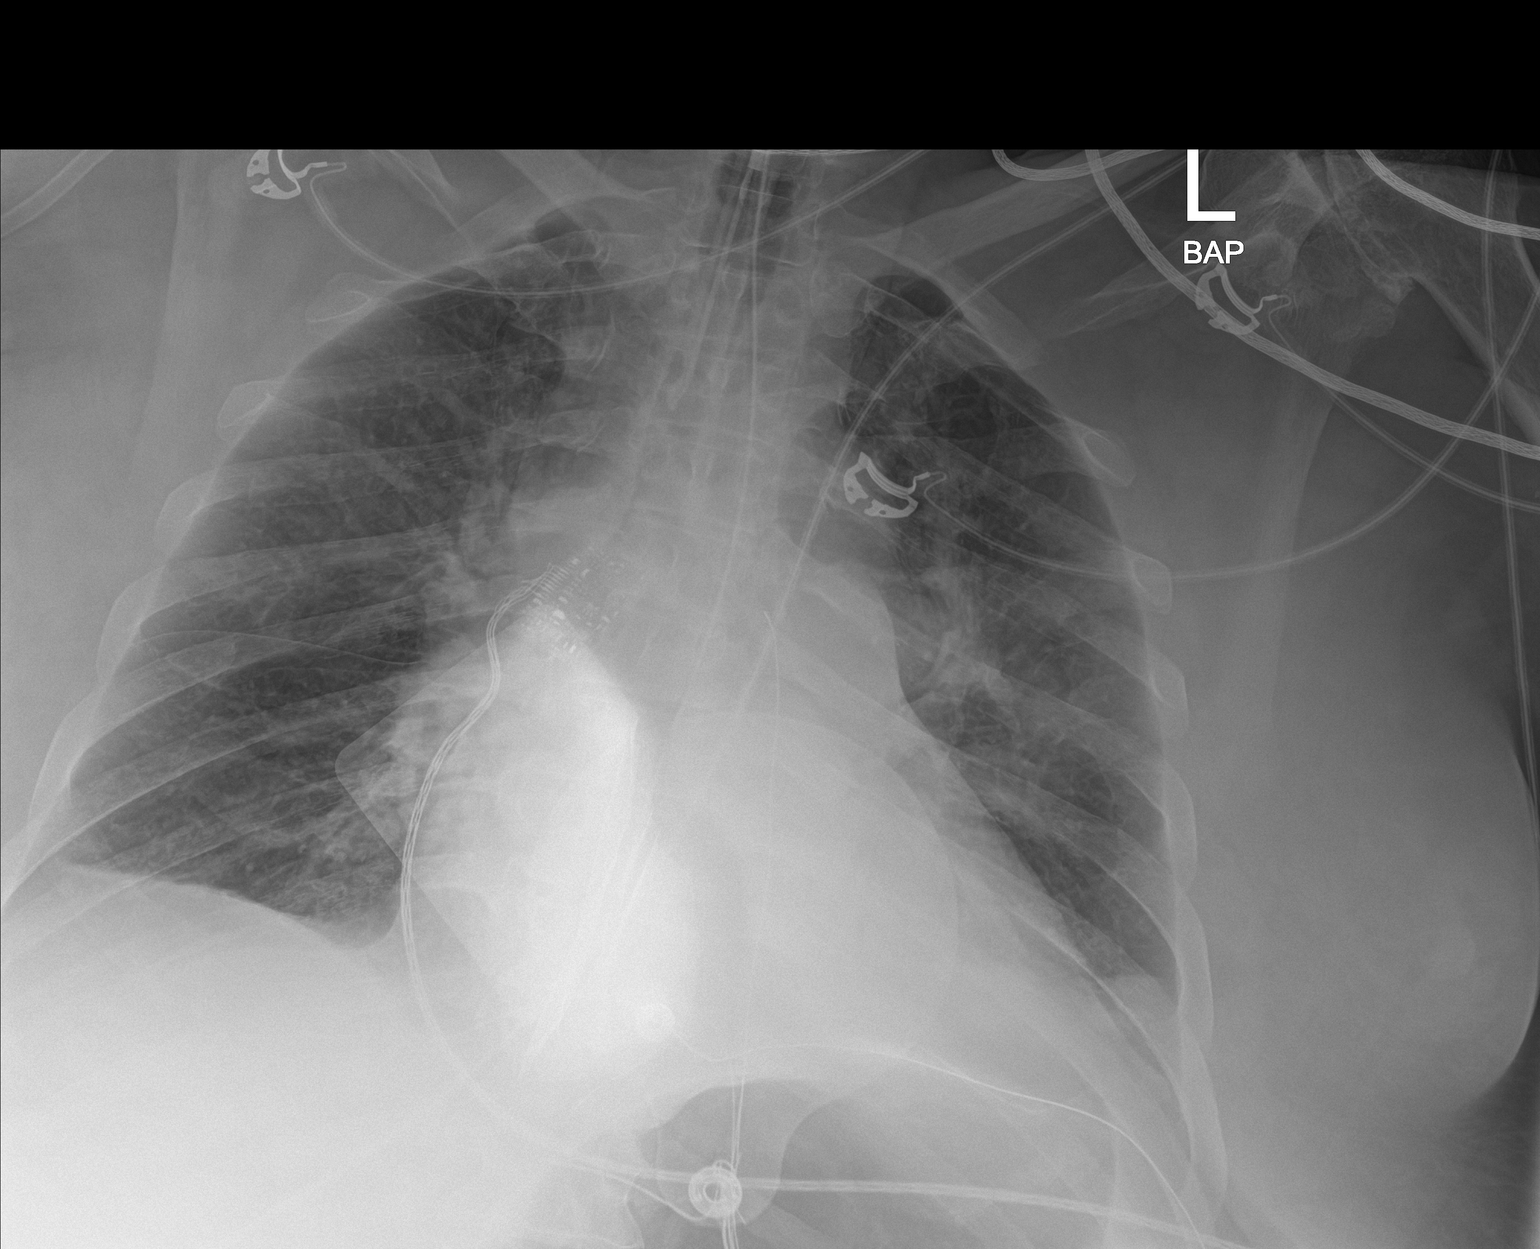

[1 of 1 positions shown; findings below may reference images not displayed]

FINDINGS: Endotracheal tube noted with tip 5.1 cm above the carina. NG tube
noted, its distal tip is difficult to discern due to overlying leads
and pads. The distal tip may be in the distal esophagus, repeat
image can be obtained. Cardiomegaly with pulmonary venous
congestion. Low lung volumes with mild bibasilar atelectasis. No
prominent pleural effusion. No pneumothorax.
IMPRESSION: 1. Endotracheal tube noted with tip 5.1 cm above the carina. NG tube
noted, its tip is difficult to discern due to overlying leads and
pads. Distal NG tube tip may be in the distal esophagus, repeat
image can be obtained.

2. Cardiomegaly with pulmonary venous congestion. Low lung volumes
with mild bibasilar atelectasis.

## 2020-04-27 IMAGING — CT CT ANGIO CHEST
2 of 8 series · 18 of 46 positions shown · IV contrast (iopamidol)
Comparison: Chest 07/31/2018, CT chest 05/19/2018

CLINICAL DATA: Cardiac arrest

EXAM:
CT ANGIOGRAPHY CHEST WITH CONTRAST
TECHNIQUE: Multidetector CT imaging of the chest was performed using the
standard protocol during bolus administration of intravenous
contrast. Multiplanar CT image reconstructions and MIPs were
obtained to evaluate the vascular anatomy.
CONTRAST:  100mL [id number] IOPAMIDOL ([id number]) INJECTION 76%

[Series 6: thins · axial · 0.78mm/px · z∈[-824,-536]mm · 15 of 318 slices shown]
[im 15/318  lung]
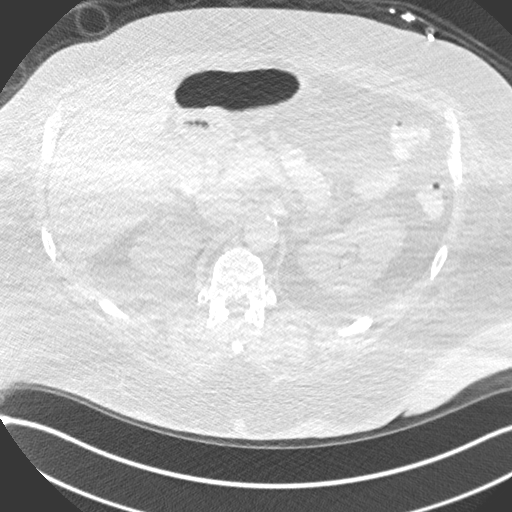
[im 44/318  soft-tissue]
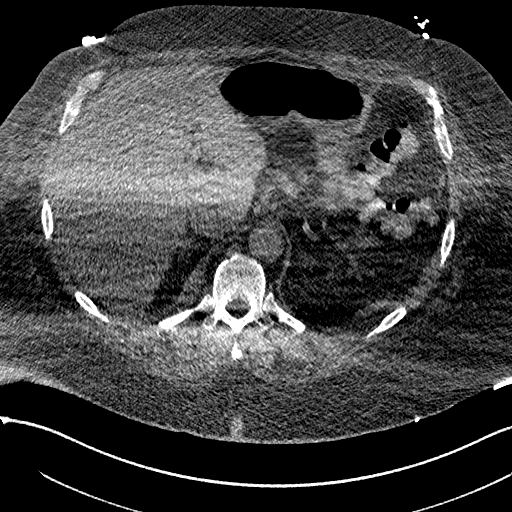
[im 58/318  lung]
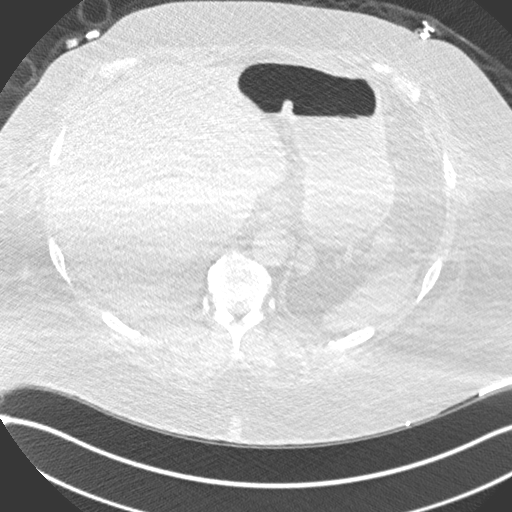
[im 73/318  soft-tissue]
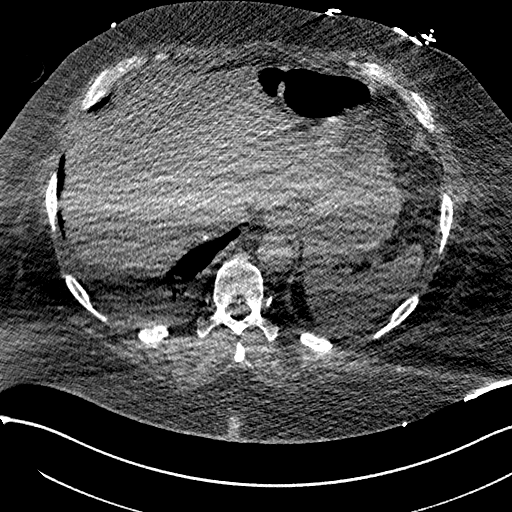
[im 101/318  lung]
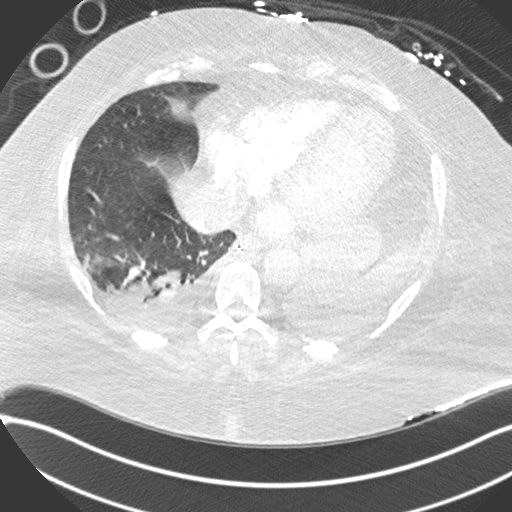
[im 116/318  soft-tissue]
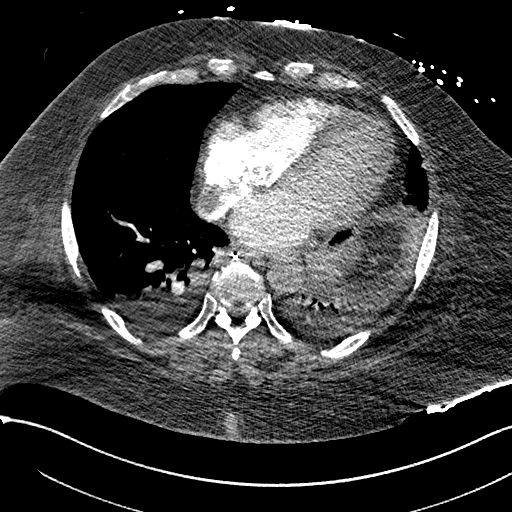
[im 145/318  lung]
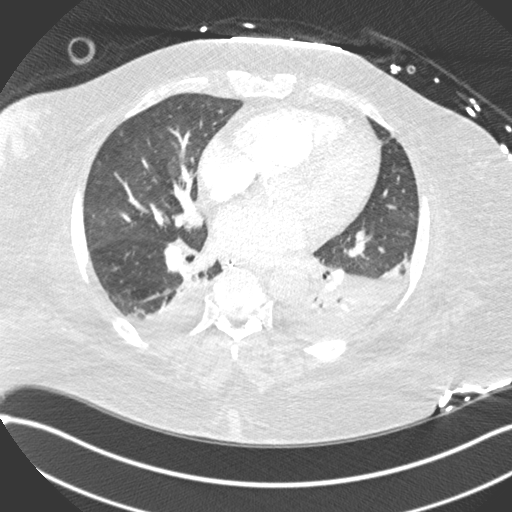
[im 159/318  soft-tissue]
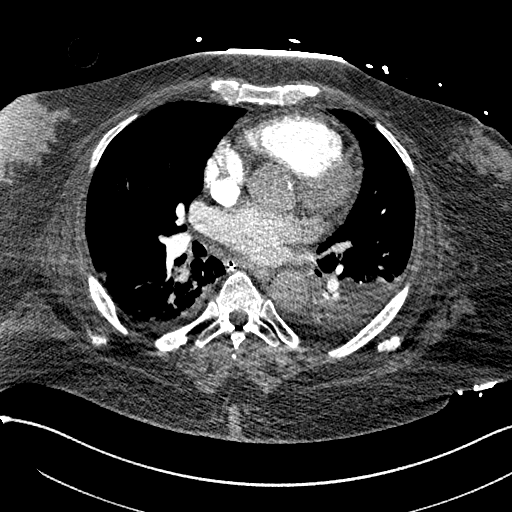
[im 173/318  lung]
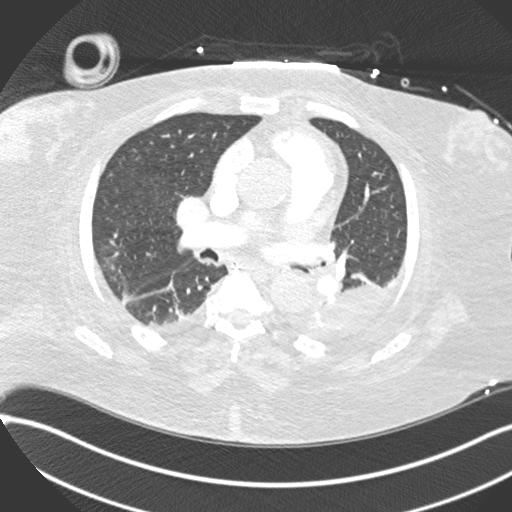
[im 202/318  soft-tissue]
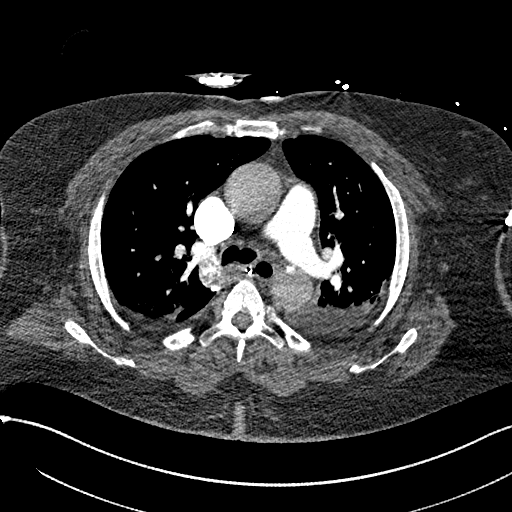
[im 217/318  lung]
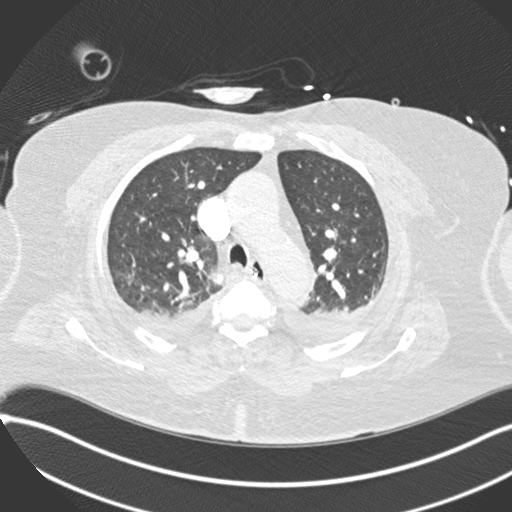
[im 245/318  soft-tissue]
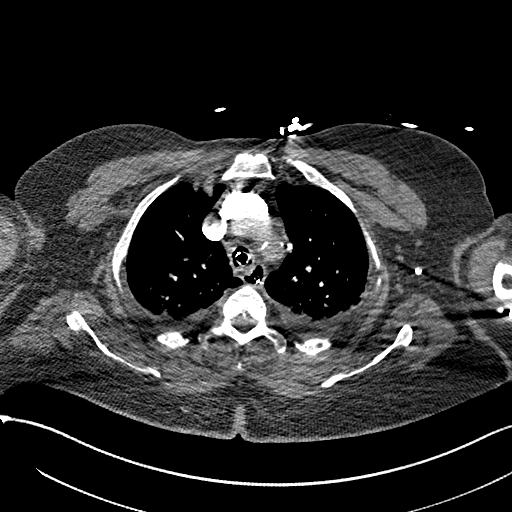
[im 260/318  lung]
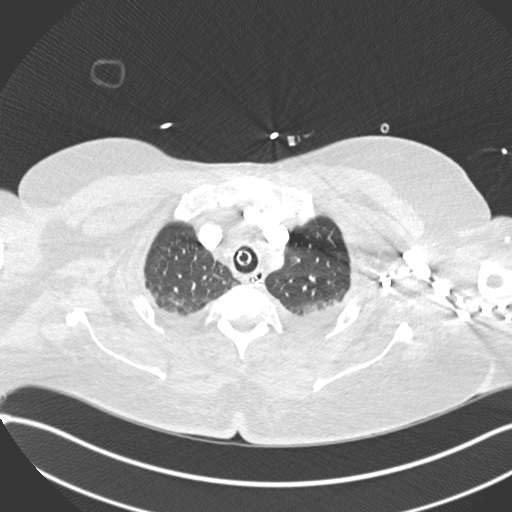
[im 274/318  soft-tissue]
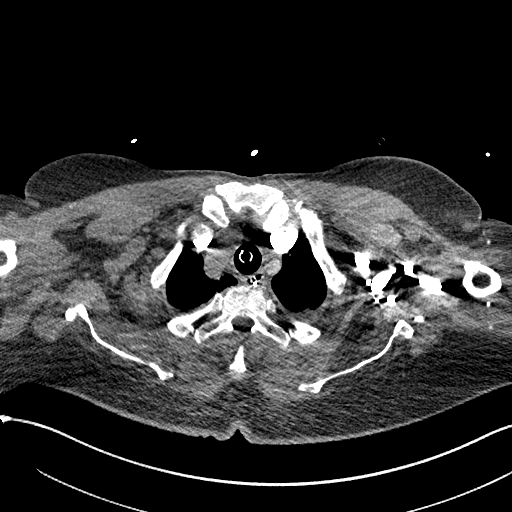
[im 303/318  lung]
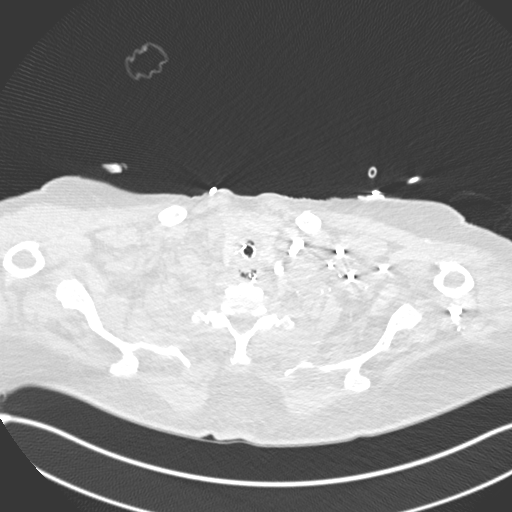

[Series 8: coronal mpr · coronal · 0.62mm/px · 3 of 121 slices shown]
[im 31/121  soft-tissue]
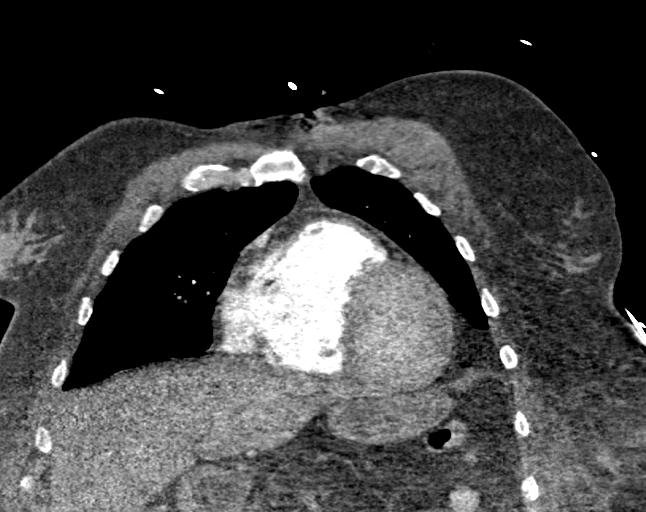
[im 61/121  soft-tissue]
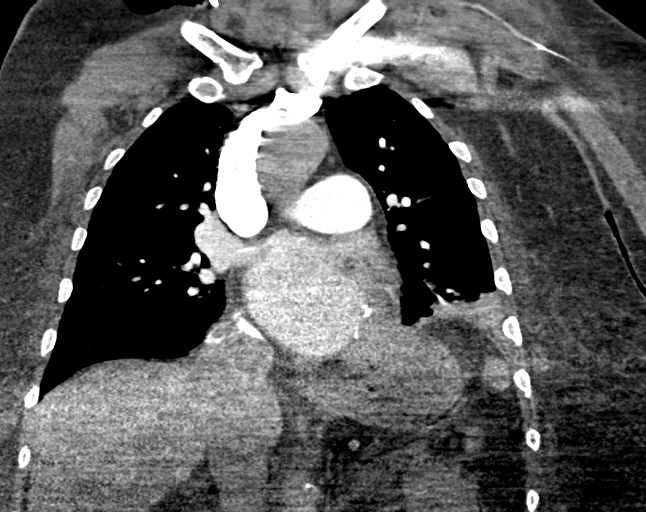
[im 91/121  soft-tissue]
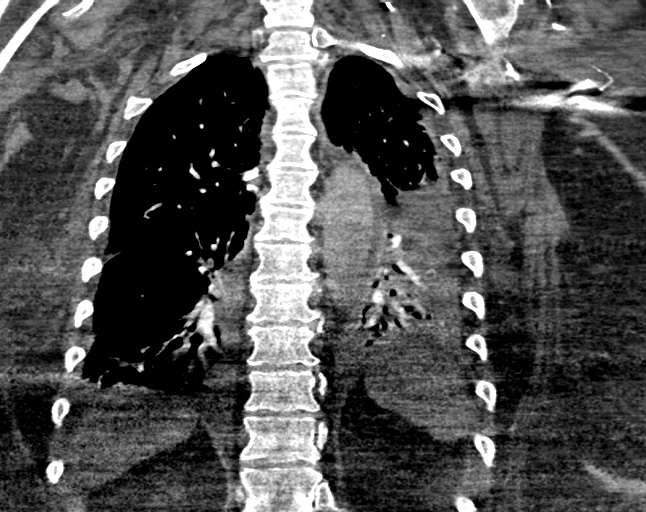

[18 of 46 positions shown; findings below may reference images not displayed]

FINDINGS: Cardiovascular: Negative for pulmonary embolism. Heart size upper
normal. Mild coronary calcification. Atherosclerotic thoracic aorta.
Aorta not adequately opacified to evaluate for dissection. Negative
for pericardial effusion

Mediastinum/Nodes: Negative for mass or adenopathy. The patient is
intubated. NG tip distal esophagus not entering the stomach.

Lungs/Pleura: Small bilateral effusions with bibasilar atelectasis
left greater than right. Upper lobes clear bilaterally.

Upper Abdomen: No acute abnormality

Musculoskeletal: Negative

Review of the MIP images confirms the above findings.
IMPRESSION: Negative for pulmonary embolism

Mild coronary calcification

Bilateral pleural effusions and bibasilar atelectasis

NG tip in the distal esophagus.  Endotracheal tube in good position.

Aortic Atherosclerosis ([id number].E).

## 2020-04-29 IMAGING — DX DG CHEST 1V PORT
1 series · 1 of 1 positions shown · non-contrast
Comparison: 07/31/2018

CLINICAL DATA: Respiratory failure.

EXAM:
PORTABLE CHEST 1 VIEW

[chest]
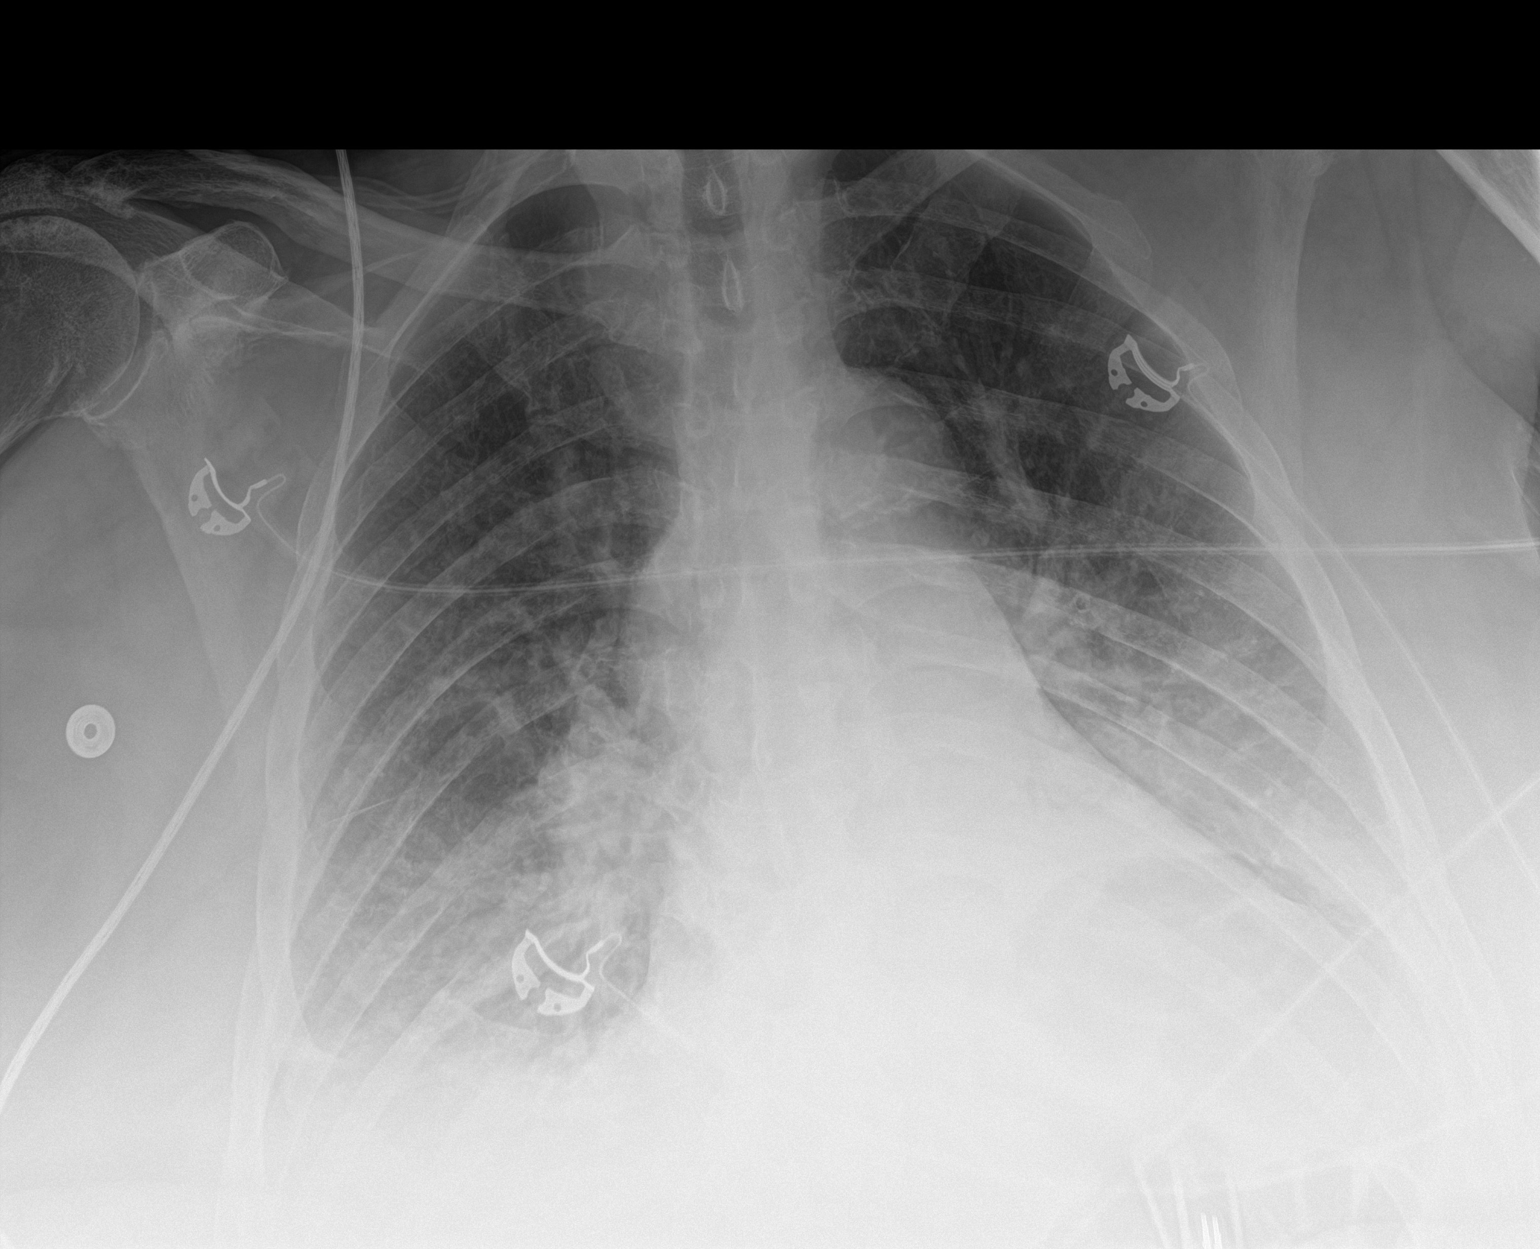

[1 of 1 positions shown; findings below may reference images not displayed]

FINDINGS: Endotracheal and enteric tubes have been removed. The cardiac
silhouette is mildly enlarged. There is persistent pulmonary
vascular congestion with increasing perihilar and basilar opacities
bilaterally. Small bilateral pleural effusions persist. No
pneumothorax is identified.
IMPRESSION: 1. Interval extubation.
2. Small bilateral pleural effusions with increasing perihilar and
basilar opacities likely reflecting worsening edema and atelectasis.
